# Patient Record
Sex: Male | Born: 1970 | Race: White | Hispanic: No | Marital: Single | Smoking: Former smoker
Health system: Southern US, Community
[De-identification: ages and names within clinical notes are randomized; demographics above are authoritative.]

## PROBLEM LIST (undated history)

## (undated) DIAGNOSIS — G4700 Insomnia, unspecified: Secondary | ICD-10-CM

## (undated) DIAGNOSIS — E785 Hyperlipidemia, unspecified: Secondary | ICD-10-CM

## (undated) DIAGNOSIS — M549 Dorsalgia, unspecified: Secondary | ICD-10-CM

## (undated) DIAGNOSIS — I509 Heart failure, unspecified: Secondary | ICD-10-CM

## (undated) DIAGNOSIS — M1990 Unspecified osteoarthritis, unspecified site: Secondary | ICD-10-CM

## (undated) DIAGNOSIS — M419 Scoliosis, unspecified: Secondary | ICD-10-CM

## (undated) DIAGNOSIS — I10 Essential (primary) hypertension: Secondary | ICD-10-CM

## (undated) DIAGNOSIS — F32A Depression, unspecified: Secondary | ICD-10-CM

## (undated) DIAGNOSIS — K7460 Unspecified cirrhosis of liver: Secondary | ICD-10-CM

## (undated) DIAGNOSIS — C649 Malignant neoplasm of unspecified kidney, except renal pelvis: Secondary | ICD-10-CM

## (undated) DIAGNOSIS — I951 Orthostatic hypotension: Secondary | ICD-10-CM

## (undated) DIAGNOSIS — G8929 Other chronic pain: Secondary | ICD-10-CM

## (undated) DIAGNOSIS — Z95810 Presence of automatic (implantable) cardiac defibrillator: Secondary | ICD-10-CM

## (undated) DIAGNOSIS — I428 Other cardiomyopathies: Secondary | ICD-10-CM

## (undated) DIAGNOSIS — Z789 Other specified health status: Secondary | ICD-10-CM

## (undated) DIAGNOSIS — I5022 Chronic systolic (congestive) heart failure: Secondary | ICD-10-CM

## (undated) DIAGNOSIS — F329 Major depressive disorder, single episode, unspecified: Secondary | ICD-10-CM

## (undated) DIAGNOSIS — E119 Type 2 diabetes mellitus without complications: Secondary | ICD-10-CM

## (undated) DIAGNOSIS — K219 Gastro-esophageal reflux disease without esophagitis: Secondary | ICD-10-CM

## (undated) DIAGNOSIS — Z720 Tobacco use: Secondary | ICD-10-CM

## (undated) HISTORY — DX: Malignant neoplasm of unspecified kidney, except renal pelvis: C64.9

## (undated) HISTORY — PX: NEPHRECTOMY: SHX65

## (undated) HISTORY — DX: Scoliosis, unspecified: M41.9

## (undated) HISTORY — DX: Tobacco use: Z72.0

## (undated) HISTORY — DX: Other cardiomyopathies: I42.8

## (undated) HISTORY — DX: Orthostatic hypotension: I95.1

## (undated) HISTORY — DX: Heart failure, unspecified: I50.9

## (undated) HISTORY — DX: Unspecified cirrhosis of liver: K74.60

---

## 2006-06-11 HISTORY — PX: OTHER SURGICAL HISTORY: SHX169

## 2010-10-31 ENCOUNTER — Emergency Department (HOSPITAL_COMMUNITY): Payer: Medicaid Other

## 2010-10-31 ENCOUNTER — Inpatient Hospital Stay (HOSPITAL_COMMUNITY)
Admission: EM | Admit: 2010-10-31 | Discharge: 2010-11-03 | DRG: 292 | Disposition: A | Discharge status: Home / Self Care | Payer: Medicaid Other | Attending: Internal Medicine | Admitting: Internal Medicine

## 2010-10-31 DIAGNOSIS — I5043 Acute on chronic combined systolic (congestive) and diastolic (congestive) heart failure: Principal | ICD-10-CM | POA: Diagnosis present

## 2010-10-31 DIAGNOSIS — F17200 Nicotine dependence, unspecified, uncomplicated: Secondary | ICD-10-CM | POA: Diagnosis present

## 2010-10-31 DIAGNOSIS — F1010 Alcohol abuse, uncomplicated: Secondary | ICD-10-CM | POA: Diagnosis present

## 2010-10-31 DIAGNOSIS — I509 Heart failure, unspecified: Secondary | ICD-10-CM | POA: Diagnosis present

## 2010-10-31 DIAGNOSIS — I426 Alcoholic cardiomyopathy: Secondary | ICD-10-CM | POA: Diagnosis present

## 2010-10-31 LAB — CBC
HCT: 43.6 % (ref 39.0–52.0)
Hemoglobin: 14.4 g/dL (ref 13.0–17.0)
MCH: 31.6 pg (ref 26.0–34.0)
MCHC: 33 g/dL (ref 30.0–36.0)
MCV: 95.6 fL (ref 78.0–100.0)
RDW: 14.6 % (ref 11.5–15.5)

## 2010-10-31 LAB — POCT CARDIAC MARKERS
CKMB, poc: 1.5 ng/mL (ref 1.0–8.0)
Troponin i, poc: <0.05 ng/mL (ref 0.00–0.09)

## 2010-10-31 LAB — DIFFERENTIAL
Eosinophils Relative: 1 % (ref 0–5)
Lymphocytes Relative: 24 % (ref 12–46)
Lymphs Abs: 1.3 10*3/uL (ref 0.7–4.0)
Monocytes Absolute: 0.6 10*3/uL (ref 0.1–1.0)
Monocytes Relative: 11 % (ref 3–12)
Neutro Abs: 3.5 10*3/uL (ref 1.7–7.7)

## 2010-10-31 LAB — COMPREHENSIVE METABOLIC PANEL
ALT: 17 U/L (ref 0–53)
BUN: 9 mg/dL (ref 6–23)
CO2: 26 mEq/L (ref 19–32)
Calcium: 10.3 mg/dL (ref 8.4–10.5)
GFR calc non Af Amer: >60 mL/min (ref >60)
Glucose, Bld: 103 mg/dL — ABNORMAL HIGH (ref 70–99)
Sodium: 139 mEq/L (ref 135–145)
Total Protein: 7.3 g/dL (ref 6.0–8.3)

## 2010-10-31 LAB — URINALYSIS, ROUTINE W REFLEX MICROSCOPIC
Bilirubin Urine: NEGATIVE
Hgb urine dipstick: NEGATIVE
Ketones, ur: NEGATIVE mg/dL
Nitrite: NEGATIVE
Specific Gravity, Urine: 1.015 (ref 1.005–1.030)
pH: 5.5 (ref 5.0–8.0)

## 2010-10-31 LAB — PRO B NATRIURETIC PEPTIDE: Pro B Natriuretic peptide (BNP): 2641 pg/mL — ABNORMAL HIGH (ref 0–125)

## 2010-11-01 DIAGNOSIS — I509 Heart failure, unspecified: Secondary | ICD-10-CM

## 2010-11-01 LAB — COMPREHENSIVE METABOLIC PANEL
ALT: 16 U/L (ref 0–53)
AST: 35 U/L (ref 0–37)
Alkaline Phosphatase: 151 U/L — ABNORMAL HIGH (ref 39–117)
CO2: 28 mEq/L (ref 19–32)
Chloride: 103 mEq/L (ref 96–112)
GFR calc Af Amer: >60 mL/min (ref >60)
GFR calc non Af Amer: >60 mL/min (ref >60)
Glucose, Bld: 99 mg/dL (ref 70–99)
Sodium: 140 mEq/L (ref 135–145)
Total Bilirubin: 0.9 mg/dL (ref 0.3–1.2)

## 2010-11-01 LAB — CBC
HCT: 40.1 % (ref 39.0–52.0)
Hemoglobin: 13.1 g/dL (ref 13.0–17.0)
RBC: 4.21 MIL/uL — ABNORMAL LOW (ref 4.22–5.81)

## 2010-11-01 LAB — DIFFERENTIAL
Basophils Absolute: 0 10*3/uL (ref 0.0–0.1)
Basophils Relative: 1 % (ref 0–1)
Lymphocytes Relative: 28 % (ref 12–46)
Monocytes Relative: 11 % (ref 3–12)
Neutro Abs: 3.1 10*3/uL (ref 1.7–7.7)
Neutrophils Relative %: 59 % (ref 43–77)

## 2010-11-01 LAB — CARDIAC PANEL(CRET KIN+CKTOT+MB+TROPI)
Relative Index: INVALID (ref 0.0–2.5)
Total CK: 71 U/L (ref 7–232)

## 2010-11-01 NOTE — H&P (Signed)
[name], MCKIVER NO.:  [id number]  MEDICAL RECORD NO.:  [id number]           PATIENT TYPE:  I  LOCATION:  A332                          FACILITY:  [facility]  PHYSICIAN:  Vania Rea, M.D. DATE OF BIRTH:  1971/02/15  DATE OF ADMISSION:  10/31/2010 DATE OF DISCHARGE:  LH                             HISTORY & PHYSICAL   PRIMARY CARE PHYSICIAN:  Unassigned.  CHIEF COMPLAINT:  Progressive shortness of breath for 1 month.  HISTORY OF PRESENT ILLNESS:  This is a 39 year old Caucasian gentleman who since having excision of kidney for Wilms tumor at age 48, says he has not had any medical followup, does not visit doctors, but 1 month ago developed flu-like symptoms with fever, cough and cold and runny nose, self-medicated with Mucinex; however, noticed he has been getting progressively more short of breath much worse for the past 1 week as well as shortness of breath.  He has severe orthopnea, cannot lie flat, and he has been having swelling of his ankle for the past 1 week.  He no longer has fever.  He continues to have cough, productive of green sputum.  He has been having no chest pains, no palpitations.  No nausea or vomiting.  He lives with his 57-year-old son who also had an episode of viral-like syndrome, but has recovered completely.  PAST MEDICAL HISTORY:  Remote history of nephrectomy for Wilms tumor.  MEDICATIONS:  Has been taking Mucinex.  ALLERGIES:  IBUPROFEN, which causes hives.  SOCIAL HISTORY:  Smokes one to one and a half packs of cigarettes per day, has been doing so for at least 20 years.  Drinks 5 to 12 beers per weekend.  Denies illicit drug use.  Works as a Engineer, petroleum.  Please note, he said he used to be a much heavier drinker, but has cut down now since his divorce.  REVIEW OF SYSTEMS:  Other than noted above unremarkable.  PHYSICAL EXAMINATION:  GENERAL:  Thin, young, Caucasian gentleman reclining in the stretcher, looks  older than his stated age. VITAL SIGNS:  Temperature is 98.2, pulse 114, respiration 18, blood pressure 125/91, he is saturating at 96% on room air. HEENT:  His pupils are round and equal.  Mucous membranes pink. Anicteric.  No cervical lymphadenopathy.  No thyromegaly.  No carotid bruits. CHEST:  Currently clear to auscultation bilaterally. CARDIOVASCULAR:  Tachycardic.  1/6 systolic murmurs. ABDOMEN:  Soft, nontender.  No masses. EXTREMITIES:  He has trace edema bilaterally. CENTRAL NERVOUS SYSTEM:  Cranial nerves II through XII are grossly intact.  He has no focal lateralizing signs.  LABORATORY DATA:  His white count is 5.5, hemoglobin 14.4, platelets 244.  He has a normal differential.  His sodium is 138, potassium 4.2, chloride 104, CO2 26, glucose 103, BUN 9, creatinine 0.8.  His calcium is 10.3.  His total bilirubin is 0.8, alk phos 153, AST 43, ALT 17, total protein 7.3, albumin 3.6.  His pro-BNP is 2641.  Cardiac markers are completely normal.  Two-view chest x-ray shows interstitial edema. No focal air space disease.  No effusion.  His urinalysis is completely  normal.  ASSESSMENT: 1. New onset congestive heart failure.  The differential includes     viral myocarditis versus alcoholic cardiomyopathy, coronary artery     disease also a possibility. 2. Alcohol abuse. 3. Tobacco abuse.  PLAN:  We will bring this gentleman in for evaluation of his new onset CHF.  We will start him on aspirin, ACE inhibitors, diuretics and potassium.  We will withhold beta-blockers for now until he is more stable.  We will get a 2-D echo and cycle his cardiac enzymes, probably benefit from Cardiology consult in the morning.  Other plans are as per orders.     Vania Rea, M.D.     LC/MEDQ  D:  11/01/2010  T:  11/01/2010  Job:  [id number]  Electronically Signed by Vania Rea M.D. on 11/01/2010 11:11:19 PM

## 2010-11-02 DIAGNOSIS — I5023 Acute on chronic systolic (congestive) heart failure: Secondary | ICD-10-CM

## 2010-11-02 DIAGNOSIS — I429 Cardiomyopathy, unspecified: Secondary | ICD-10-CM

## 2010-11-02 DIAGNOSIS — I369 Nonrheumatic tricuspid valve disorder, unspecified: Secondary | ICD-10-CM

## 2010-11-02 LAB — DRUGS OF ABUSE SCREEN W/O ALC, ROUTINE URINE
Amphetamine Screen, Ur: NEGATIVE
Creatinine,U: 70.7 mg/dL
Marijuana Metabolite: NEGATIVE
Methadone: NEGATIVE
Opiate Screen, Urine: NEGATIVE

## 2010-11-02 LAB — BASIC METABOLIC PANEL
Chloride: 99 mEq/L (ref 96–112)
Creatinine, Ser: 0.95 mg/dL (ref 0.4–1.5)
GFR calc Af Amer: >60 mL/min (ref >60)
Potassium: 3.9 mEq/L (ref 3.5–5.1)
Sodium: 138 mEq/L (ref 135–145)

## 2010-11-02 LAB — IRON AND TIBC
Saturation Ratios: 16 % — ABNORMAL LOW (ref 20–55)
TIBC: 400 ug/dL (ref 215–435)

## 2010-11-02 LAB — TSH: TSH: 3.647 u[IU]/mL (ref 0.350–4.500)

## 2010-11-02 LAB — FERRITIN: Ferritin: 52 ng/mL (ref 22–322)

## 2010-11-03 LAB — BASIC METABOLIC PANEL
BUN: 10 mg/dL (ref 6–23)
Chloride: 100 mEq/L (ref 96–112)
GFR calc non Af Amer: >60 mL/min (ref >60)
Glucose, Bld: 113 mg/dL — ABNORMAL HIGH (ref 70–99)
Potassium: 3.9 mEq/L (ref 3.5–5.1)

## 2010-11-03 LAB — LIPID PANEL: Triglycerides: 62 mg/dL (ref <150)

## 2010-11-06 NOTE — Discharge Summary (Signed)
[name], [name]                ACCOUNT NO.:  [id number]  MEDICAL RECORD NO.:  [id number]           PATIENT TYPE:  I  LOCATION:  A332                          FACILITY:  [facility]  PHYSICIAN:  Elliot Cousin, M.D.    DATE OF BIRTH:  05-04-1971  DATE OF ADMISSION:  10/31/2010 DATE OF DISCHARGE:  05/25/2012LH                              DISCHARGE SUMMARY   DISCHARGE DIAGNOSES: 1. Newly diagnosed acute systolic congestive heart failure.  The     patient's ejection fraction was noted to be 15-20% on the 2-D     echocardiogram on Nov 02, 2010. 2. Severe cardiomyopathy with the patient's echocardiogram revealing     diffuse hypokinesis, ejection fraction of 15-20%, grade 1 diastolic     dysfunction, mild mitral valve regurgitation, left atrium mildly to     moderately dilated, right ventricular systolic function was mildly     reduced, right atrium was mildly to moderately dilated, tricuspid     valve with moderate regurgitation, and pulmonary systolic pressure     was moderately increased at 55 mmHg.  The possible etiology is that     the patient has a viral cardiomyopathy versus alcohol-induced     cardiomyopathy. 3. Tobacco and alcohol abuse.  The patient was advised to stop     both.  DISCHARGE MEDICATIONS: 1. Carvedilol 6.25 mg b.i.d. 2. Furosemide 40 mg daily. 3. Lisinopril 10 mg daily. 4. Multivitamin with iron once daily.  DISCHARGE DISPOSITION:  The patient was discharged to home in improved and stable condition on Nov 03, 2010.  He will follow up with Dr. Dietrich Pates on Nov 09, 2010.  CONSULTATIONS: 1. Judith [place] Bing, MD, [facility] . 2. Jonelle Sidle, MD  PROCEDURE PERFORMED: 1. 2-D echocardiogram on Nov 02, 2010.  The results were dictated     above. 2. Chest x-ray on Oct 31, 2010.  The results revealed interstitial     edema. 3. Abdominal x-ray on Oct 31, 2010.  The results revealed no acute     finding in the abdomen.  Cardiomegaly.  HISTORY OF PRESENT ILLNESS:   The patient is a 40 year old man with a past medical history significant for Wilms tumor status post nephrectomy in the past, who presented to the emergency department on Oct 31, 2010, with a chief complaint of progressive shortness of breath for approximately 1 month.  The patient also complained of swelling in his legs and ankles for the past week.  One month ago, he developed flu-like symptoms with a fever, cough, runny nose, and body aches.  He self medicated with Mucinex.  He improved symptomatically but then became progressively short of breath.  In the emergency department, he was noted to be afebrile and tachycardic with a heart rate ranging from 103- 114 beats per minute.  His EKG revealed sinus tachycardia with a heart rate of 104 beats per minute, biatrial enlargement, superior right axis deviation, and lateral T-wave changes.  His BNP was elevated at 2641. His initial cardiac markers were negative.  He was admitted for further evaluation and management.  HOSPITAL COURSE:  The patient was started empirically on  Lasix 40 mg IVq.12 h, potassium chloride supplementation, 81 mg of aspirin, ACE inhibitor therapy with captopril, and fluid restriction.  His I' and O's and daily weights were monitored.  A nicotine patch was placed.  He was advised to stop smoking permanently.  He was also started on vitamin therapy and as-needed Ativan, although the patient had no signs or symptoms consistent with alcohol withdrawal syndrome.  He was advised to stop drinking.  Nitroglycerin paste was applied in the emergency department, however because of low normal blood pressures, it was eventually discontinued.  For further evaluation, a number of studies were ordered.  His followup BNP actually increased to 3846.  His electrolyte panel was essentially within normal limits.  His cardiac enzymes were all within normal limits.  His TSH was within normal limits at 3.6 and his free T4 was within normal  limits at 1.36.  Cardiologist Dr. Thayne Bing was consulted.  He evaluated the patient and agreed that the patient probably had an underlying cardiomyopathy.  The 2-D echocardiogram which had been ordered, had not been read or interpreted at the time of Dr. Marvel Plan assessment.  Even before the results of the 2-D echocardiogram, Dr. Dietrich Pates made some adjustments in the his medications.  He discontinued the captopril and started him instead on lisinopril.  He discontinued aspirin and transitioned the Lasix from IV to p.o.  He also started Coreg.  He ordered a number of other studies.  The results of the studies revealed a negative urine drug screen, negative HIV antibody, and a normal ferritin of 52.  The results of the 2-D echocardiogram were dictated above.  It was clear that he had a severe cardiomyopathy.  The cardiology team did not believe his cardiomyopathy was ischemic in nature.  However, it was proposed that in the near future, he would require a right heart and left heart cardiac catheterization.  On admission, the patient's weight was 57.7 kg and prior to discharge it was 52.8 kg.  He diuresed several liters of fluid effectively during the hospitalization.  At the time of hospital discharge, he was free of shortness of breath.  His peripheral edema had completely resolved.     Elliot Cousin, M.D.     DF/MEDQ  D:  11/03/2010  T:  11/04/2010  Job:  [id number]  cc:   Gerrit Friends. Dietrich Pates, MD, [facility] [address] [place], [place] [address]  Electronically Signed by Elliot Cousin M.D. on 11/06/2010 06:50:07 PM

## 2010-11-09 ENCOUNTER — Ambulatory Visit (INDEPENDENT_AMBULATORY_CARE_PROVIDER_SITE_OTHER): Payer: Medicaid Other | Admitting: Adult Health

## 2010-11-09 ENCOUNTER — Encounter: Payer: Self-pay | Admitting: *Deleted

## 2010-11-09 ENCOUNTER — Encounter: Payer: Self-pay | Admitting: Adult Health

## 2010-11-09 DIAGNOSIS — I428 Other cardiomyopathies: Secondary | ICD-10-CM

## 2010-11-09 DIAGNOSIS — I420 Dilated cardiomyopathy: Secondary | ICD-10-CM

## 2010-11-09 DIAGNOSIS — I5020 Unspecified systolic (congestive) heart failure: Secondary | ICD-10-CM | POA: Insufficient documentation

## 2010-11-09 NOTE — Patient Instructions (Signed)
Your physician recommends that you schedule a follow-up appointment in: 1 month Low salt diet - hand out Note- out of work until return

## 2010-11-09 NOTE — Progress Notes (Signed)
HPI: [name] is a 40 y/o CM patient of Dr. Dietrich Pates with known history of excision of kidney for Wilms tumor at age 40, with no regular physician follow-up since who presented to [facility] with shortness of breath where we saw him on consultation.  He was diagnosed with nonischemic CM per echo demonstrating EF of 15%-20%, and systolic CHF.  He has a  History of ongoing tobacco and ETOH abuse.  He was diuresed and placed on carvedilol 6.25 mg BID, furosemide 40 mg daily, lisinopril 10 mg daily, and a multivitamin with iron daily.  He is here post hospital follow-up. He brings with him daily weight records and maintains his weight nicely.  He denies symptoms of chest discomfort, shortness of breath or edema. He speaks of his personal stress as a single parent raising an 97 y/o and the stress of this illness.  He wonders if this contributed to his decline in health.  Otherwise he is without complaint.  Allergies  Allergen Reactions  . Ibuprofen Hives    Current Outpatient Prescriptions  Medication Sig Dispense Refill  . carvedilol (COREG) 6.25 MG tablet Take 6.25 mg by mouth 2 (two) times daily with a meal.        . furosemide (LASIX) 40 MG tablet Take 40 mg by mouth daily.        Marland Kitchen lisinopril (PRINIVIL,ZESTRIL) 10 MG tablet Take 10 mg by mouth daily.        . Multiple Vitamins-Minerals (MULTIVITAMIN WITH MINERALS) tablet Take 1 tablet by mouth daily.          Past Medical History  Diagnosis Date  . CHF (congestive heart failure)     new on set  . Myocarditis   . Cardiomyopathy   . CAD (coronary artery disease)     Past Surgical History  Procedure Date  . Nephrectomy     wilms tumor    ROS: Review of systems complete and found to be negative unless listed above PHYSICAL EXAM General: Well developed, well nourished, in no acute distress Head: Eyes PERRLA, No xanthomas.   Normal cephalic and atramatic  Lungs: Clear bilaterally to auscultation and percussion. Heart: HRRR S1 S2 Pulses are 2+  & equal.            No carotid bruit. No JVD.  No abdominal bruits. No femoral bruits. Abdomen: Bowel sounds are positive, abdomen soft and non-tender without masses or                  Hernia's noted. Msk:  Back normal, normal gait. Normal strength and tone for age. Extremities: No clubbing, cyanosis or edema.  DP +1 Neuro: Alert and oriented X 3. Psych:  Good affect, responds appropriately :  ASSESSMENT AND PLAN

## 2010-11-09 NOTE — Assessment & Plan Note (Signed)
He remains compliant with his medications. He has stopped smoking. He concerned about the welfare of his son and wants to be healthy in order to care for him.  He is still having some trouble with fast food because it is easier to do when he works.  Sandwich meats and hotdogs are also foods that he eats.  I have advised him on a low salt diet and reminded him of his heart condition and need to be careful about fluid overload.  I have given him  low salt diet instructions.  He will see me in one month. May need to increase his coreg at that time.  Follow-up echo in July.

## 2010-12-08 ENCOUNTER — Encounter: Payer: Self-pay | Admitting: *Deleted

## 2010-12-08 ENCOUNTER — Encounter: Payer: Self-pay | Admitting: Adult Health

## 2010-12-08 ENCOUNTER — Ambulatory Visit (INDEPENDENT_AMBULATORY_CARE_PROVIDER_SITE_OTHER): Payer: Medicaid Other | Admitting: Adult Health

## 2010-12-08 DIAGNOSIS — I5020 Unspecified systolic (congestive) heart failure: Secondary | ICD-10-CM

## 2010-12-08 DIAGNOSIS — I428 Other cardiomyopathies: Secondary | ICD-10-CM

## 2010-12-08 NOTE — Progress Notes (Signed)
HPI: Mr. Crookston is a 40 y/o CM patient of Dr. Dietrich Pates with known history of excision of kidney for Wilms tumor at age 3, with no regular physician follow-up since who presented to [facility] with shortness of breath where we saw him on consultation.  He was diagnosed with nonischemic CM per echo demonstrating EF of 15%-20%, and systolic CHF.  He has a  History of ongoing tobacco and ETOH abuse.  He was diuresed and placed on carvedilol 6.25 mg BID, furosemide 40 mg daily, lisinopril 10 mg daily, and a multivitamin with iron daily.  He is here post hospital follow-up. He brings with him daily weight records and maintains his weight nicely.  He denies symptoms of chest discomfort, shortness of breath or edema. He speaks of his personal stress as a single parent raising an 38 y/o and the stress of this illness.  He wonders if this contributed to his decline in health.  Otherwise he is without complaint. On last visit, he was doing well, compliant with his medications and was counseled on low salt diet.  Today he is doing well and has stopped drinking Mt. Dew, completely avoiding salt. He has not gone back to work yet.  He remains as active as he can.Quit smoking. Wt down 3 lbs.  Allergies  Allergen Reactions  . Ibuprofen Hives    Current Outpatient Prescriptions  Medication Sig Dispense Refill  . carvedilol (COREG) 6.25 MG tablet Take 6.25 mg by mouth 2 (two) times daily with a meal.        . furosemide (LASIX) 40 MG tablet Take 40 mg by mouth daily.        Marland Kitchen lisinopril (PRINIVIL,ZESTRIL) 10 MG tablet Take 10 mg by mouth daily.        . Multiple Vitamins-Minerals (MULTIVITAMIN WITH MINERALS) tablet Take 1 tablet by mouth daily.          Past Medical History  Diagnosis Date  . CHF (congestive heart failure)     new on set  . Myocarditis   . Cardiomyopathy   . CAD (coronary artery disease)     Past Surgical History  Procedure Date  . Nephrectomy     wilms tumor   VS: 123/81 89  16 wt 118  lbs. ROS: Review of systems complete and found to be negative unless listed above PHYSICAL EXAM General: Well developed, well nourished, in no acute distress Head: Eyes PERRLA, No xanthomas.   Normal cephalic and atramatic  Lungs: Clear bilaterally to auscultation and percussion. Heart: HRRR S1 S2 Pulses are 2+ & equal.            No carotid bruit. No JVD.  No abdominal bruits. No femoral bruits. Abdomen: Bowel sounds are positive, abdomen soft and non-tender without masses or                  Hernia's noted. Msk:  Back normal, normal gait. Normal strength and tone for age. Extremities: No clubbing, cyanosis or edema.  DP +1 Neuro: Alert and oriented X 3. Psych:  Good affect, responds appropriately :  ASSESSMENT AND PLAN

## 2010-12-08 NOTE — Assessment & Plan Note (Signed)
No evidence of fluid overload.  Plans as above.

## 2010-12-08 NOTE — Assessment & Plan Note (Signed)
He is doing really well.  I will repeat ECHO for improvement of LV fx.  I will increase his coreg 6.25 mg BID, to 9.75 mg in am and continue 6.25mg  at evening dose.  He will have a follow-up BMET for LV fx. Will see him in 2 weeks to ascertain whether he can go back to work.  He is given work Physicist, medical for 2 more weeks.

## 2010-12-08 NOTE — Patient Instructions (Addendum)
Your physician recommends that you schedule a follow-up appointment in: July 13  Your physician recommends that you return for lab work in: 2 weeks Your physician has requested that you have an echocardiogram. Echocardiography is a painless test that uses sound waves to create images of your heart. It provides your doctor with information about the size and shape of your heart and how well your heart's chambers and valves are working. This procedure takes approximately one hour. There are no restrictions for this procedure.  Your physician has recommended you make the following change in your medication: CHANGE COREG TO 1 1/2  IN AM AND 1 TABLET IN PM

## 2010-12-11 ENCOUNTER — Ambulatory Visit (HOSPITAL_COMMUNITY)
Admission: RE | Admit: 2010-12-11 | Discharge: 2010-12-11 | Disposition: A | Discharge status: Home / Self Care | Payer: Medicaid Other | Source: Ambulatory Visit | Attending: Cardiology | Admitting: Cardiology

## 2010-12-11 DIAGNOSIS — F17200 Nicotine dependence, unspecified, uncomplicated: Secondary | ICD-10-CM | POA: Insufficient documentation

## 2010-12-11 DIAGNOSIS — I059 Rheumatic mitral valve disease, unspecified: Secondary | ICD-10-CM

## 2010-12-11 DIAGNOSIS — I509 Heart failure, unspecified: Secondary | ICD-10-CM | POA: Insufficient documentation

## 2010-12-11 DIAGNOSIS — R079 Chest pain, unspecified: Secondary | ICD-10-CM | POA: Insufficient documentation

## 2010-12-11 NOTE — Consult Note (Signed)
[name], [name]                ACCOUNT NO.:  [id number]  MEDICAL RECORD NO.:  [id number]           PATIENT TYPE:  I  LOCATION:  A332                          FACILITY:  [facility]  PHYSICIAN:  Gerrit Friends. Dietrich Pates, MD, FACCDATE OF BIRTH:  03-Mar-1971  DATE OF CONSULTATION: DATE OF DISCHARGE:                                CONSULTATION   REFERRING PHYSICIAN:  Dr. Orvan Falconer.  PRIMARY CARE PHYSICIAN:  None.  HISTORY OF PRESENT ILLNESS:  A 40 year old gentleman with a 47-month history of progressive exercise intolerance, lassitude, malaise and orthopnea and edema for the few days prior to presenting to the emergency department with congestive heart failure.  This nice gentleman is a single parent who works as a Chartered certified accountant.  He has had a benign medical history since undergoing nephrectomy for Wilms tumor at age 28. Approximately 2 months ago, he developed an acute illness with URI symptoms that was fairly severe, but resolved over the course of a fewdays.  He subsequently has noted malaise, inability to perform much physical activity and overwhelming fatigue.  He has had modest dyspnea on exertion that has been progressive over the past few days and has developed orthopnea and pedal edema prompting evaluation in the emergency department where chest x-ray showed pulmonary edema.  With an initial diuresis of 2.5 L, his symptoms have resolved.  He denies hypertension or hyperlipidemia, but has received little in the way of medical care.  He has been a cigarette smoker with an approximate 30-pack-year consumption.  He reports 5-12 beers per weekend, but has not suffered DTs nor does he have other findings of alcohol habituation. He reports even higher alcohol consumption in the past.  PAST MEDICAL HISTORY:  Negative except as noted above.  MEDICATIONS:  Essentially none at the time of presentation.  ALLERGIES:  IBUPROFEN resulted in urticaria.  SOCIAL HISTORY:  Divorced and serves as a  single parent to his young son.  Alcohol and tobacco consumption as noted above.  He denies illicit drugs or other HIV risk factors.  REVIEW OF SYSTEMS:  He consumes an unrestricted diet at home.  He has upper and lower dentures.  There is no impairment in eyesight or hearing.  He has been thought to be depressed in recent weeks, but this may simply have been generalized weakness and in addition related to congestive heart failure.  He reports difficulty falling asleep, which he attributes to stress.  All other systems reviewed and are negative.  PHYSICAL EXAMINATION:  GENERAL:  Trim, pleasant gentleman in no acute distress. VITAL SIGNS;  The weight was 57.7 kg on admission, falling to 55.9 today.  He is afebrile with a heart rate of 100, respirations of 20 and a blood pressure 115/80.  O2 saturation 93% on room air. HEENT:  EOMs full; pupils equal, round, react to light; normal oral mucosa. NECK:  Mild jugular venous distention; normal carotid upstrokes without bruits. ENDOCRINE:  No thyromegaly. HEMATOPOIETIC:  No adenopathy. SKIN:  No significant lesions. CHEST:  Normal AP diameter; few basilar rales. CARDIAC:  Normal first and second heart sounds; prominent third heart sounds; systolic murmur at the  left sternal border and apex. ABDOMEN:  Soft and nontender; normal bowel sounds; no organomegaly.  No bruits. EXTREMITIES:  No edema; normal distal pulses. NEUROLOGIC:  Symmetric strength and tone; normal cranial nerves. PSYCHIATRIC:  Alert and oriented; normal affect.  EKG:  Sinus tachycardia; left atrial abnormality; right atrial enlargement; borderline low-voltage; delayed R-wave progression; extreme right axis; nonspecific ST-T wave abnormality.  Other laboratory generally unremarkable with a normal CBC, normal chemistry profile, and normal cardiac markers.  Pro BNP is elevated to 3846.  IMPRESSION:  [name] unfortunately appears to be presenting with a cardiomyopathy,  was certainly nonischemic.  Due to malfunction of our ultrasounds system, an echocardiogram could not be performed today.  We will push to obtain that study early tomorrow.  I will proceed as if he has impaired left ventricular systolic function by increasing his ACE inhibitor, adding low dose beta-blocker, which could be titrated as an outpatient and continuing diuretic.  It appears that he will be ready for discharge tomorrow.  A TSH level, HIV level and iron studies will be obtained looking for a cause of his cardiomyopathy.  The possibility of a viral etiology is raised by his recent URI-type illness.  The possibility of alcohol cardiomyopathy is raised by his history of drinking.  He certainly should stop this immediately.  The patient expressed an interest in discontinuing cigarette smoking, which certainly would be beneficial in general.  We appreciate the request for consultation and we will be happy to follow this nice gentleman once he is discharged from hospital.     Gerrit Friends. Dietrich Pates, MD, [facility]     RMR/MEDQ  D:  11/01/2010  T:  11/02/2010  Job:  [id number]  Electronically Signed by Girard Bing MD [facility] on 12/11/2010 05:02:51 PM

## 2010-12-21 ENCOUNTER — Other Ambulatory Visit: Payer: Self-pay | Admitting: Adult Health

## 2010-12-22 ENCOUNTER — Ambulatory Visit: Payer: Self-pay | Admitting: Adult Health

## 2010-12-22 LAB — BASIC METABOLIC PANEL
CO2: 28 mEq/L (ref 19–32)
Calcium: 9.9 mg/dL (ref 8.4–10.5)
Chloride: 98 mEq/L (ref 96–112)
Glucose, Bld: 132 mg/dL — ABNORMAL HIGH (ref 70–99)
Sodium: 134 mEq/L — ABNORMAL LOW (ref 135–145)

## 2010-12-25 ENCOUNTER — Encounter: Payer: Self-pay | Admitting: Adult Health

## 2010-12-25 ENCOUNTER — Ambulatory Visit (INDEPENDENT_AMBULATORY_CARE_PROVIDER_SITE_OTHER): Payer: Medicaid Other | Admitting: Adult Health

## 2010-12-25 DIAGNOSIS — E875 Hyperkalemia: Secondary | ICD-10-CM

## 2010-12-25 DIAGNOSIS — I420 Dilated cardiomyopathy: Secondary | ICD-10-CM

## 2010-12-25 DIAGNOSIS — I428 Other cardiomyopathies: Secondary | ICD-10-CM

## 2010-12-25 DIAGNOSIS — I5020 Unspecified systolic (congestive) heart failure: Secondary | ICD-10-CM

## 2010-12-25 NOTE — Assessment & Plan Note (Signed)
No evidence of shortness of breath, fluid retension, or JVD.  He appears euvolemic.  He will continue on low salt diet.

## 2010-12-25 NOTE — Progress Notes (Signed)
HPI: [name] is a 39 y/o CM patient of Dr. Dietrich Pates with known history of excision of kidney for Wilms tumor at age 43, with no regular physician follow-up since who presented to [facility] with shortness of breath where we saw him on consultation.  He was diagnosed with nonischemic CM per echo demonstrating EF of 15%-20%, and systolic CHF.  He has a  History of ongoing tobacco and ETOH abuse.  He was diuresed and placed on carvedilol 6.25 mg BID, furosemide 40 mg daily, lisinopril 10 mg daily, and a multivitamin with iron daily.  He is here post hospital follow-up. He brings with him daily weight records and maintains his weight nicely.  He denies symptoms of chest discomfort, shortness of breath or edema. He speaks of his personal stress as a single parent raising an 3 y/o and the stress of this illness.  He wonders if this contributed to his decline in health.  Otherwise he is without complaint. On last visit, he was doing well, compliant with his medications and was counseled on low salt diet. He has not had cardiac catheterization for evaluation for CAD.  Today he is doing well and has stopped drinking Mt. Dew, completely avoiding salt. He has not gone back to work yet.  He remains as active as he can.Quit smoking. Wt down 3 lbs.  I repeated his echocardiogram to assess for improvement in LV after 3 months of medications. I also repeated lab work to check kidney fx.  He is anxious to return to work. He is asymptomatic.  Allergies  Allergen Reactions  . Ibuprofen Hives    Current Outpatient Prescriptions  Medication Sig Dispense Refill  . carvedilol (COREG) 6.25 MG tablet Take 1 1/2 tablets in am and 1 tablet in pm      . furosemide (LASIX) 40 MG tablet Take 40 mg by mouth daily.        Marland Kitchen lisinopril (PRINIVIL,ZESTRIL) 10 MG tablet Take 10 mg by mouth daily.        . Multiple Vitamins-Minerals (MULTIVITAMIN WITH MINERALS) tablet Take 1 tablet by mouth daily.          Past Medical History  Diagnosis  Date  . CHF (congestive heart failure)     new on set  . Myocarditis   . Cardiomyopathy     Past Surgical History  Procedure Date  . Nephrectomy     wilms tumor   VS: 123/81 89  16 wt 118 lbs. ROS: Review of systems complete and found to be negative unless listed above PHYSICAL EXAM General: Well developed, well nourished, in no acute distress Head: Eyes PERRLA, No xanthomas.   Normal cephalic and atramatic  Lungs: Clear bilaterally to auscultation and percussion. Heart: HRRR S1 S2 Pulses are 2+ & equal.            No carotid bruit. No JVD.  No abdominal bruits. No femoral bruits. Abdomen: Bowel sounds are positive, abdomen soft and non-tender without masses or                  Hernia's noted. Msk:  Back normal, normal gait. Normal strength and tone for age. Extremities: No clubbing, cyanosis or edema.  DP +1 Neuro: Alert and oriented X 3. Psych:  Good affect, responds appropriately :  ASSESSMENT AND PLAN

## 2010-12-25 NOTE — Assessment & Plan Note (Addendum)
I have reviewed echocardiogram and EF is now 25%.  With diffuse hypokinesis.  I have discussed this with Dr. Dietrich Pates who has reviewed his history and notes with me.  He advised to go ahead and titrate up Coreg to 12.5mg  BID for two more weeks and then to 25 mg BID.  He will continue other medications.    BMET demonstrated  Potassium of 5.7 with no medications contributing to this. I did not ask him if he is using "No -Salt" potassium chloride seasoning.  There was a discussion about stopping lisinopril in this setting. Creat 0.76.  We will repeat the BMET in 2 weeks and give him a low potassium diet.  Cortisol and aldosterone levels with be drawn with that lab.  He is to see Dr. Dietrich Pates on follow up.  There was no discussion for ICD at this time. This will be addressed at a later date after seen by Dr. Dietrich Pates.    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

## 2010-12-25 NOTE — Patient Instructions (Addendum)
**Note De-Identified  Obfuscation** Your physician recommends that you return for lab work in: today  Your physician recommends that you decrease potassium in your diet  Your physician recommends that you schedule a follow-up appointment in: 2 weeks

## 2010-12-26 LAB — BASIC METABOLIC PANEL
BUN: 6 mg/dL (ref 6–23)
CO2: 26 mEq/L (ref 19–32)
Chloride: 100 mEq/L (ref 96–112)
Creat: 0.86 mg/dL (ref 0.50–1.35)
Glucose, Bld: 93 mg/dL (ref 70–99)

## 2010-12-28 ENCOUNTER — Other Ambulatory Visit: Payer: Self-pay

## 2010-12-28 ENCOUNTER — Telehealth: Payer: Self-pay

## 2010-12-31 ENCOUNTER — Encounter: Payer: Self-pay | Admitting: *Deleted

## 2011-01-01 ENCOUNTER — Other Ambulatory Visit: Payer: Self-pay | Admitting: Cardiology

## 2011-01-01 ENCOUNTER — Ambulatory Visit (INDEPENDENT_AMBULATORY_CARE_PROVIDER_SITE_OTHER): Payer: Medicaid Other | Admitting: Cardiology

## 2011-01-01 ENCOUNTER — Encounter: Payer: Self-pay | Admitting: Adult Health

## 2011-01-01 DIAGNOSIS — I428 Other cardiomyopathies: Secondary | ICD-10-CM

## 2011-01-01 DIAGNOSIS — I420 Dilated cardiomyopathy: Secondary | ICD-10-CM

## 2011-01-01 DIAGNOSIS — I5020 Unspecified systolic (congestive) heart failure: Secondary | ICD-10-CM

## 2011-01-01 DIAGNOSIS — I951 Orthostatic hypotension: Secondary | ICD-10-CM

## 2011-01-01 DIAGNOSIS — Z01818 Encounter for other preprocedural examination: Secondary | ICD-10-CM

## 2011-01-01 LAB — CBC
HCT: 40.4 % (ref 39.0–52.0)
Hemoglobin: 13.7 g/dL (ref 13.0–17.0)
MCH: 30.7 pg (ref 26.0–34.0)
MCV: 90.6 fL (ref 78.0–100.0)
RBC: 4.46 MIL/uL (ref 4.22–5.81)

## 2011-01-01 LAB — APTT: aPTT: 27 seconds (ref 24–37)

## 2011-01-01 LAB — BASIC METABOLIC PANEL
CO2: 30 mEq/L (ref 19–32)
Glucose, Bld: 114 mg/dL — ABNORMAL HIGH (ref 70–99)
Potassium: 4.5 mEq/L (ref 3.5–5.3)
Sodium: 134 mEq/L — ABNORMAL LOW (ref 135–145)

## 2011-01-01 LAB — ALDOSTERONE + RENIN ACTIVITY W/ RATIO: PRA LC/MS/MS: 1.73 ng/mL/h (ref 0.25–5.82)

## 2011-01-01 MED ORDER — CARVEDILOL 12.5 MG PO TABS: 12.5000 mg | ORAL_TABLET | Freq: Two times a day (BID) | ORAL | Status: DC

## 2011-01-01 MED ORDER — FUROSEMIDE 20 MG PO TABS: 20.0000 mg | ORAL_TABLET | Freq: Every day | ORAL | Status: DC

## 2011-01-01 NOTE — Assessment & Plan Note (Addendum)
Patient has a cardiomyopathy of uncertain etiology, possibly secondary to alcohol use.  He has a number of risk factors for cardiovascular disease, but no manifestations of same.  Cardiac catheterization will be undertaken to further investigate the etiology of his cardiac condition and the extent to which he is currently compensated.  The risks of the procedure were described to him including CVA, MI and death.  He agrees to proceed.  We discussed the advisability of AICD implantation.  He is not a candidate at present due to the recent diagnosis of CHF, but will be referred to Dr. Ladona Ridgel for further evaluation.

## 2011-01-01 NOTE — Assessment & Plan Note (Addendum)
Patient presented with congestive heart failure, but now appears relatively well compensated.  Despite some improvement in left ventricular systolic function, performance status may remain significantly impaired.  We discussed the possibility of a disability application.  He would prefer to work if possible.  Unfortunately low blood pressure, particularly when standing, is limiting the effectiveness of treatment for his cardiomyopathy and CHF.  These issues will be revisited at his next appointment immediately following cardiac catheterization.

## 2011-01-01 NOTE — Assessment & Plan Note (Addendum)
Patient was symptomatic on one occasion and now has a marginally significant drop in systolic blood pressure when standing without symptoms.  Unfortunately, he would potentially benefit from further up titration of his medication for cardiomyopathy, which could further exacerbate blood pressure issues.  For ease of administration, his dose of Coreg will be increased slightly to 12.5 mg b.i.d., but his dose of furosemide will be decreased to 20 mg q.d.  He will monitor her weight and symptoms and report any significant change.  We will continue to monitor electrolytes and renal function and plan a return visit subsequent to cardiac catheterization.Marland Kitchen

## 2011-01-01 NOTE — Patient Instructions (Addendum)
Your physician has recommended you make the following change in your medication:Coreg to 12.5 mg twice daily, decrease Lasix to 20 mg daily (PLEASE HOLD THE DAY OF CATH)  Your physician recommends that you return for lab work in: TODAY  Your physician recommends that you schedule a follow-up appointment in: after cath

## 2011-01-01 NOTE — Progress Notes (Signed)
HPI : Mr. Auzenne is seen in the office today at his request for evaluation of worsening symptoms since he returned to work one week ago.  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  On one occasion, when he was exerting himself, he experienced dizziness and blackening of his vision without loss of consciousness.  He has had some vague aching discomfort in the left arm and shoulder as well.  He denies dyspnea, orthopnea or pedal edema.  Weight has been stable.  Current Outpatient Prescriptions on File Prior to Visit  Medication Sig Dispense Refill  . lisinopril (PRINIVIL,ZESTRIL) 10 MG tablet Take 10 mg by mouth daily.        . Multiple Vitamins-Minerals (MULTIVITAMIN WITH MINERALS) tablet Take 1 tablet by mouth daily.        Marland Kitchen DISCONTD: carvedilol (COREG) 6.25 MG tablet Take 1 1/2 tablets in am and 1 tablet in pm      . DISCONTD: furosemide (LASIX) 40 MG tablet Take 40 mg by mouth daily.           Allergies  Allergen Reactions  . Ibuprofen Hives      Past medical history, social history, and family history reviewed and updated.  ROS:   See history of present illness.  PHYSICAL EXAM: BP 105/67  Pulse 88  Ht 5\' 9"  (1.753 m)  Wt 53.524 kg (118 lb)  BMI 17.43 kg/m2  SpO2 96%  General-Well developed; no acute distress Body habitus-Very thin Neck-No JVD; no carotid bruits Lungs-clear lung fields; resonant to percussion Cardiovascular-normal PMI; normal S1; increased intensity of S2. Abdomen-normal bowel sounds; soft and non-tender without masses or organomegaly Musculoskeletal-moderate scoliosis in the lumbar region convex to the right, no cyanosis or clubbing Neurologic-Normal cranial nerves; symmetric strength and tone Skin-Warm, no significant lesions Extremities-distal pulses intact; no edema  EKG: Normal sinus rhythm; left atrial enlargement; nonspecific T-wave abnormality in the  lateral leads; increased QRS voltage, normal for age, delayed R-wave progression; no prior tracing for comparison.  ASSESSMENT AND PLAN:

## 2011-01-02 ENCOUNTER — Ambulatory Visit (HOSPITAL_COMMUNITY)
Admission: RE | Admit: 2011-01-02 | Discharge: 2011-01-02 | Disposition: A | Discharge status: Home / Self Care | Payer: Medicaid Other | Source: Ambulatory Visit | Attending: Cardiovascular Disease | Admitting: Cardiovascular Disease

## 2011-01-02 DIAGNOSIS — I428 Other cardiomyopathies: Secondary | ICD-10-CM | POA: Insufficient documentation

## 2011-01-02 LAB — POCT I-STAT 3, ART BLOOD GAS (G3+)
Bicarbonate: 26.2 mEq/L — ABNORMAL HIGH (ref 20.0–24.0)
O2 Saturation: 99 %
TCO2: 27 mmol/L (ref 0–100)
pCO2 arterial: 42.8 mmHg (ref 35.0–45.0)

## 2011-01-02 LAB — POCT I-STAT 3, VENOUS BLOOD GAS (G3P V)
Acid-Base Excess: 1 mmol/L (ref 0.0–2.0)
Bicarbonate: 27 mEq/L — ABNORMAL HIGH (ref 20.0–24.0)
TCO2: 28 mmol/L (ref 0–100)
pO2, Ven: 41 mmHg (ref 30.0–45.0)

## 2011-01-03 NOTE — Cardiovascular Report (Signed)
NAMEGANON, DEMASI NO.:  [id number]  MEDICAL RECORD NO.:  [id number]  LOCATION:  [facility]                         FACILITY:  [facility]  PHYSICIAN:  Verne Carrow, MDDATE OF BIRTH:  [birth year]  DATE OF PROCEDURE:  01/02/2011 DATE OF DISCHARGE:  01/02/2011                           CARDIAC CATHETERIZATION   PRIMARY CARDIOLOGIST:  Gerrit Friends. Dietrich Pates, MD, [facility]  PROCEDURES PERFORMED: 1. Left heart catheterization. 2. Right heart catheterization. 3. Selective coronary angiography. 4. Left ventricular angiogram.  OPERATOR:  Verne Carrow, MD  INDICATIONS:  This is a 39-year Caucasian male with a history of former alcohol abuse and tobacco abuse who has recently been found to have left ventricular systolic dysfunction.  Diagnostic catheterization was planned for today to assess pulmonary pressures and also to exclude coronary artery disease as the cause of his cardiomyopathy.  DETAILS OF PROCEDURE:  The patient was brought to the main cardiac catheterization laboratory after signing informed consent for the procedure.  The right groin was prepped and draped in sterile fashion. A 1% lidocaine was used for local anesthesia.  A 5-French sheath was inserted into the right femoral artery without difficulty.  A 5-French sheath was inserted into the right femoral vein without difficulty.  A right heart catheterization was performed with a 5-French multipurpose catheter.  We then performed selective coronary angiography with standard diagnostic catheters.  Pigtail catheter was used to cross the aortic valve into the left ventricle.  A hand injection was used to perform a left ventricular angiogram.  The patient tolerated the procedure well.  There were no immediate complications.  HEMODYNAMIC FINDINGS: 1. Central aortic pressure 102/69. 2. Left ventricular pressure 87/7/18. 3. Right atrial pressure 3. 4. Right ventricular pressure 35/2/4. 5. Pulmonary  artery pressure 33/13 with a mean of 20. 6. Pulmonary capillary wedge pressure 13. 7. Cardiac output 4.78 liters per minute. 8. Cardiac index 2.99 liters per minute per meter squared. 9. Pulmonary artery saturation 74%. 10.Central aortic saturation 99%.  ANGIOGRAPHIC FINDINGS: 1. The left main coronary artery had no evidence of disease. 2. The left anterior descending was a large vessel that coursed to the     apex and gave off several small-caliber diagonal branches and one     moderate-sized diagonal branch.  There was no evidence of disease     in this vessel.  This vessel did wrap around the apex. 3. The circumflex artery gave off a large bifurcating obtuse marginal     branch from the midportion of the vessel.  There was no disease in     this vessel.  The AV groove circumflex beyond the takeoff of the     obtuse marginal was small in caliber had no disease. 4. Right coronary artery was a large dominant vessel with no evidence     of disease. 5. Left ventricular angiogram was performed in the RAO projection with     a hand injection and showed global left ventricular systolic     function with ejection fraction of 25%.  IMPRESSION: 1. No angiographic evidence of coronary artery disease. 2. Normal right heart pressures. 3. Nonischemic cardiomyopathy with severe left ventricular systolic     dysfunction.  RECOMMENDATIONS:  I recommend continued medical management of this patient's cardiomyopathy.  He is on a good medical regimen at this time. Follow up with Dr. Dietrich Pates in 2 weeks.     Verne Carrow, MD     CM/MEDQ  D:  01/02/2011  T:  01/03/2011  Job:  [id number]  cc:   Gerrit Friends. Dietrich Pates, MD, [facility]  Electronically Signed by Verne Carrow MD on 01/03/2011 01:48:03 PM

## 2011-01-05 ENCOUNTER — Ambulatory Visit (INDEPENDENT_AMBULATORY_CARE_PROVIDER_SITE_OTHER): Payer: Medicaid Other | Admitting: Cardiology

## 2011-01-05 ENCOUNTER — Encounter: Payer: Self-pay | Admitting: Cardiology

## 2011-01-05 VITALS — BP 117/66 | HR 83 | Ht 69.0 in | Wt 121.0 lb

## 2011-01-05 DIAGNOSIS — I428 Other cardiomyopathies: Secondary | ICD-10-CM

## 2011-01-05 DIAGNOSIS — I420 Dilated cardiomyopathy: Secondary | ICD-10-CM

## 2011-01-05 NOTE — Progress Notes (Signed)
HPI : [name] returns to the office following cardiac catheterization performed as an outpatient.  He had no apparent complications with the procedure.  Results were good showing normal coronary arteries, good cardiac output and normal intracardiac pressures.  LV systolic function had improved to 25%.  Current Outpatient Prescriptions on File Prior to Visit  Medication Sig Dispense Refill  . carvedilol (COREG) 12.5 MG tablet Take 1 tablet (12.5 mg total) by mouth 2 (two) times daily with a meal. Dose increase  60 tablet  3  . furosemide (LASIX) 20 MG tablet Take 1 tablet (20 mg total) by mouth daily.  30 tablet  3  . lisinopril (PRINIVIL,ZESTRIL) 10 MG tablet Take 10 mg by mouth daily.        . Multiple Vitamins-Minerals (MULTIVITAMIN WITH MINERALS) tablet Take 1 tablet by mouth daily.           Allergies  Allergen Reactions  . Ibuprofen Hives      Past medical history, social history, and family history reviewed and updated.  PHYSICAL EXAM: BP 117/66  Pulse 83  Ht 5\' 9"  (1.753 m)  Wt 54.885 kg (121 lb)  BMI 17.87 kg/m2  SpO2 94%  General-Well developed; no acute distress Body habitus-thin Neck-No JVD, no carotid bruits Lungs: clear lung fields; normal I:E ratio Abdomen-normal bowel sounds; soft and non-tender without masses or organomegaly Skin-Warm, no significant lesions Extremities-Nl distal pulses; no edema; benign right femoral catheterization site   ASSESSMENT AND PLAN:

## 2011-01-05 NOTE — Patient Instructions (Signed)
**Note De-identified  Obfuscation** Your physician recommends that you continue on your current medications as directed. Please refer to the Current Medication list given to you today.  Your physician recommends that you schedule a follow-up appointment in: 6 weeks  

## 2011-01-05 NOTE — Assessment & Plan Note (Addendum)
Patient is doing well with current medication.  Symptoms are controlled; hemodynamics are good; and, there has been modest recovery in LV function.  I will make no modification of his medical therapy and plan to see this nice gentleman again in 6 weeks.

## 2011-01-08 ENCOUNTER — Encounter: Payer: Self-pay | Admitting: *Deleted

## 2011-01-08 ENCOUNTER — Other Ambulatory Visit: Payer: Self-pay | Admitting: Internal Medicine

## 2011-01-09 ENCOUNTER — Ambulatory Visit: Payer: Medicaid Other | Admitting: Cardiology

## 2011-01-10 ENCOUNTER — Telehealth: Payer: Self-pay | Admitting: *Deleted

## 2011-01-10 MED ORDER — LISINOPRIL 10 MG PO TABS: 10.0000 mg | ORAL_TABLET | Freq: Every day | ORAL | Status: DC

## 2011-01-10 NOTE — Telephone Encounter (Signed)
rx escribed to pharmacy 

## 2011-01-19 ENCOUNTER — Ambulatory Visit (INDEPENDENT_AMBULATORY_CARE_PROVIDER_SITE_OTHER): Payer: Medicaid Other | Admitting: Adult Health

## 2011-01-19 ENCOUNTER — Encounter: Payer: Self-pay | Admitting: Adult Health

## 2011-01-19 DIAGNOSIS — I429 Cardiomyopathy, unspecified: Secondary | ICD-10-CM

## 2011-01-19 DIAGNOSIS — I428 Other cardiomyopathies: Secondary | ICD-10-CM

## 2011-01-19 DIAGNOSIS — R55 Syncope and collapse: Secondary | ICD-10-CM

## 2011-01-19 DIAGNOSIS — I420 Dilated cardiomyopathy: Secondary | ICD-10-CM

## 2011-01-19 NOTE — Patient Instructions (Signed)
**Note De-Identified  Obfuscation** Your physician has recommended that you wear a holter monitor. Holter monitors are medical devices that record the heart's electrical activity. Doctors most often use these monitors to diagnose arrhythmias. Arrhythmias are problems with the speed or rhythm of the heartbeat. The monitor is a small, portable device. You can wear one while you do your normal daily activities. This is usually used to diagnose what is causing palpitations/syncope (passing out).  You have been referred to Dr. Ladona Ridgel

## 2011-01-20 ENCOUNTER — Encounter: Payer: Self-pay | Admitting: Adult Health

## 2011-01-20 NOTE — Progress Notes (Signed)
HPI:  [name] is a 40 y/o patient of Dr Dietrich Pates we are following for nonischemic CM, probable ETOH etiology, with EF of 25% with recent cardiac cath 01/02/2011, demonstrating normal coronaries, good cardiac output and normal intracardiac pressures.  He comes today after being seen 2 weeks ago after having 3 episodes of blacking out.Two occurred at work and one at home. He did not experience chest pain, palpitations or dyspnea associated with these.  No seizure activity was noted.  He states he simply lost his vision for about 15 seconds, once ending up on the floor at work. His boss has taken him out of work until we evaluate him further. He is medically compliant.  Allergies  Allergen Reactions  . Ibuprofen Hives    Current Outpatient Prescriptions  Medication Sig Dispense Refill  . aspirin 81 MG tablet Take 81 mg by mouth daily.        . carvedilol (COREG) 12.5 MG tablet Take 1 tablet (12.5 mg total) by mouth 2 (two) times daily with a meal. Dose increase  60 tablet  3  . furosemide (LASIX) 20 MG tablet Take 1 tablet (20 mg total) by mouth daily.  30 tablet  3  . lisinopril (PRINIVIL,ZESTRIL) 10 MG tablet Take 1 tablet (10 mg total) by mouth daily.  30 tablet  9  . Multiple Vitamins-Minerals (MULTIVITAMIN WITH MINERALS) tablet Take 1 tablet by mouth daily.          Past Medical History  Diagnosis Date  . Cardiomyopathy     EF of 15%; improves to 25% after initiation of medical therapy; presented with congestive heart failure    Past Surgical History  Procedure Date  . Nephrectomy     wilms tumor    RUE:AVWUJW of systems complete and found to be negative unless listed above PHYSICAL EXAM BP 120/83  Pulse 74  Ht 5\' 9"  (1.753 m)  Wt 121 lb (54.885 kg)  BMI 17.87 kg/m2  SpO2 96% General: Well developed, well nourished, in no acute distress Head: Eyes PERRLA, No xanthomas.   Normal cephalic and atramatic  Lungs: Clear bilaterally to auscultation and percussion. Heart: HRRR S1  S2,   Pulses are 2+ & equal.            No carotid bruit. No JVD.  No abdominal bruits. No femoral bruits. Abdomen: Bowel sounds are positive, abdomen soft and non-tender without masses or                  Hernia's noted. Msk:  Back normal, normal gait. Normal strength and tone for age. Extremities: No clubbing, cyanosis or edema.  DP +1 Neuro: Alert and oriented X 3. Psych:  Good affect, responds appropriately  EKG:NSR rate of 67 bpm.  Possible LAE.  ASSESSMENT AND PLAN

## 2011-01-20 NOTE — Assessment & Plan Note (Signed)
I am concerned that arrythmia may have precipitated his "blackout spells" in the setting of CM with recent EF of 25%. Orthostatics have been completed and are normal.  He has no documentation of seizures.   I will place a holter monitor on him for 48 hours to assess for arrythmia, VT.  He is to see Dr. Lewayne Bunting for evaluation and discussion for need for ICD.  I have discussed this with Dr. Dietrich Pates. We will not adjust his medications at this time.  Review of recent labs did not demonstrate hypokalemia.   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  We have given him an out of work letter until seen by Dr. Ladona Ridgel.  We will follow after Dr. Lubertha Basque evaluation and recommendations.

## 2011-01-24 ENCOUNTER — Ambulatory Visit (HOSPITAL_COMMUNITY)
Admission: RE | Admit: 2011-01-24 | Discharge: 2011-01-24 | Disposition: A | Discharge status: Home / Self Care | Payer: Medicaid Other | Source: Ambulatory Visit | Attending: Cardiology | Admitting: Cardiology

## 2011-01-24 DIAGNOSIS — R55 Syncope and collapse: Secondary | ICD-10-CM | POA: Insufficient documentation

## 2011-01-24 NOTE — Progress Notes (Signed)
*  PRELIMINARY RESULTS* Echocardiogram 48H Holter monitor has been performed.  Conrad Silerton 01/24/2011, 1:00 PM

## 2011-01-25 ENCOUNTER — Encounter: Payer: Self-pay | Admitting: *Deleted

## 2011-01-26 ENCOUNTER — Ambulatory Visit (INDEPENDENT_AMBULATORY_CARE_PROVIDER_SITE_OTHER): Payer: Medicaid Other | Admitting: Internal Medicine

## 2011-01-26 ENCOUNTER — Encounter: Payer: Self-pay | Admitting: Internal Medicine

## 2011-01-26 DIAGNOSIS — I5022 Chronic systolic (congestive) heart failure: Secondary | ICD-10-CM

## 2011-01-26 DIAGNOSIS — I5020 Unspecified systolic (congestive) heart failure: Secondary | ICD-10-CM

## 2011-01-26 DIAGNOSIS — I951 Orthostatic hypotension: Secondary | ICD-10-CM

## 2011-01-26 NOTE — Assessment & Plan Note (Signed)
His current symptoms are class 1-2. He is on maximal medical therapy. His major limitation for up titration of medications has been orthostasis. His left ventricular ejection fraction has improved with medical therapy. I have strongly counseled him to stop drinking alcohol altogether. While ICD therapy was discussed and would be strongly considered, his young age makes me think we should wait to see if his left ventricular ejection fraction improves before proceeding with ICD implantation. The patient tells him that he has worn a cardiac monitor. If he has long runs of nonsustained ventricular tachycardia then proceed with ICD implantation would be more strongly considered.

## 2011-01-26 NOTE — Assessment & Plan Note (Signed)
I strongly suspect that his near syncopal episodes are related entirely to orthostasis. I've encouraged the patient to move a bit slower.

## 2011-01-26 NOTE — Progress Notes (Signed)
HPI [name] is referred today by Dr. Dietrich Pates for evaluation of a nonischemic cardiomyopathy, EF 25%, near syncopal spells, in the setting of alcohol abuse. The patient was diagnosed with congestive heart failure in May of 2012. Initially his ejection fraction was 15%. Catheterization was carried out which demonstrated normal coronary arteries. The patient states that his daily alcohol consumption was 6-8 beers per day. He was placed on medical therapy and told to stop drinking alcohol. Repeat echo 2 months later demonstrated improvement in his left ventricular ejection fraction 25%. The patient has class 1-2 symptoms at present. He does note episodes of near syncope which have been characterized as standing up quickly and feeling like the room was going to turn black. These episodes resolved within a few seconds. There is no associated palpitations. By his report he has never had frank syncope. He denies chest pain. Upright of his medical therapy has been limited by his orthostatic symptoms. Allergies  Allergen Reactions  . Ibuprofen Hives     Current Outpatient Prescriptions  Medication Sig Dispense Refill  . aspirin 81 MG tablet Take 81 mg by mouth daily.        . carvedilol (COREG) 12.5 MG tablet Take 1 tablet (12.5 mg total) by mouth 2 (two) times daily with a meal. Dose increase  60 tablet  3  . furosemide (LASIX) 20 MG tablet Take 1 tablet (20 mg total) by mouth daily.  30 tablet  3  . lisinopril (PRINIVIL,ZESTRIL) 10 MG tablet Take 1 tablet (10 mg total) by mouth daily.  30 tablet  9  . Multiple Vitamins-Minerals (MULTIVITAMIN WITH MINERALS) tablet Take 1 tablet by mouth daily.           Past Medical History  Diagnosis Date  . Cardiomyopathy     EF of 15%; improves to 25% after initiation of medical therapy; presented with congestive heart failure  . Systolic heart failure   . Orthostatic hypotension     ROS:   All systems reviewed and negative except as noted in the  HPI.   Past Surgical History  Procedure Date  . Nephrectomy     wilms tumor     No family history on file.   History   Social History  . Marital Status: Legally Separated    Spouse Name: N/A    Number of Children: 1  . Years of Education: N/A   Occupational History  . full time     Engineer, petroleum   Social History Main Topics  . Smoking status: Former Games developer  . Smokeless tobacco: Never Used  . Alcohol Use: Yes  . Drug Use: No  . Sexually Active: Not on file   Other Topics Concern  . Not on file   Social History Narrative  . No narrative on file     BP 100/58  Pulse 70  Ht 5\' 9"  (1.753 m)  Wt 121 lb (54.885 kg)  BMI 17.87 kg/m2  Physical Exam:  Well appearing NAD HEENT: Unremarkable Neck:  No JVD, no thyromegally Lymphatics:  No adenopathy Back:  No CVA tenderness Lungs:  Clear with no wheezes, rales, or rhonchi. HEART:  Regular rate rhythm, no murmurs, no rubs, no clicks. PMI is enlarged and laterally displaced. Abd:  Scaphoid, soft, positive bowel sounds, no organomegally, no rebound, no guarding Ext:  2 plus pulses, no edema, no cyanosis, no clubbing Skin:  No rashes no nodules Neuro:  CN II through XII intact, motor grossly intact  Assess/Plan:

## 2011-01-26 NOTE — Patient Instructions (Signed)
Your physician recommends that you schedule a follow-up appointment in: 3 months with Dr Ladona Ridgel  Your physician has requested that you have an echocardiogram. Echocardiography is a painless test that uses sound waves to create images of your heart. It provides your doctor with information about the size and shape of your heart and how well your heart's chambers and valves are working. This procedure takes approximately one hour. There are no restrictions for this procedure.--- in 3 months prior to appt with Dr Ladona Ridgel

## 2011-01-27 NOTE — Procedures (Signed)
[name], [name] NO.:  [id number]  MEDICAL RECORD NO.:  [id number]  LOCATION:  CARDIOPU                      FACILITY:  [facility]  PHYSICIAN:  Gerrit Friends. Dietrich Pates, MD, FACCDATE OF BIRTH:  19-Mar-1971  DATE OF PROCEDURE:  01/24/2011 DATE OF DISCHARGE:  01/25/2011                               HOLTER MONITOR   CLINICAL DATA:  This is a 40 year old gentleman with syncope.  FINDINGS: 1. Continuous electrocardiographic monitoring was maintained for 47     hours and 46 minutes during which the predominant rhythm was normal     sinus with sinus bradycardia to rates as low as 42 bpm and sinus     tachycardia to a peak rate of 114 bpm. 2. Rare premature ventricular depolarizations and premature atrial     depolarizations occurred, each recorded at an average rate of fewer     than 2 per hour.  No significant arrhythmias were identified. 3. No ST-segment elevation or depression was noted. 4. A single episode of dizziness was reported during which the rhythm     was normal sinus.     Gerrit Friends. Dietrich Pates, MD, [facility]     RMR/MEDQ  D:  01/27/2011  T:  01/27/2011  Job:  [id number]

## 2011-01-28 ENCOUNTER — Other Ambulatory Visit: Payer: Self-pay | Admitting: Cardiology

## 2011-01-28 DIAGNOSIS — I420 Dilated cardiomyopathy: Secondary | ICD-10-CM

## 2011-01-31 ENCOUNTER — Telehealth: Payer: Self-pay | Admitting: Cardiology

## 2011-01-31 ENCOUNTER — Other Ambulatory Visit (HOSPITAL_COMMUNITY): Payer: Medicaid Other | Admitting: Radiology

## 2011-01-31 NOTE — Telephone Encounter (Signed)
PT NEEDS A LETTER STATING HE CAN GO BACK TO WORK HE IS STARTING BACK Monday 02/05/11/TMJ

## 2011-01-31 NOTE — Progress Notes (Signed)
Addended by: Augusto Gamble on: 01/31/2011 01:11 PM   Modules accepted: Orders

## 2011-02-16 ENCOUNTER — Encounter: Payer: Self-pay | Admitting: Cardiology

## 2011-02-16 ENCOUNTER — Encounter: Payer: Self-pay | Admitting: *Deleted

## 2011-02-16 ENCOUNTER — Ambulatory Visit (INDEPENDENT_AMBULATORY_CARE_PROVIDER_SITE_OTHER): Payer: Medicaid Other | Admitting: Cardiology

## 2011-02-16 DIAGNOSIS — I420 Dilated cardiomyopathy: Secondary | ICD-10-CM

## 2011-02-16 DIAGNOSIS — I428 Other cardiomyopathies: Secondary | ICD-10-CM

## 2011-02-16 NOTE — Assessment & Plan Note (Addendum)
[name] is asymptomatic with current medical therapy.  Although optimal medical therapy would require an increase in dosage of most of his meds, he is doing so well that I am not inclined to make any medication changes at this visit.  Left ventricular systolic function will be assessed in 2 months just prior to a planned return visit to Dr. Ladona Ridgel for consideration of an AICD implant.  I will see this nice gentleman again in 4 months.  In the interim, we will monitor electrolytes and renal function.

## 2011-02-16 NOTE — Patient Instructions (Signed)
Your physician recommends that you schedule a follow-up appointment in: 4 months  Your physician recommends that you return for lab work in: 1 month (October) and 3 months (December)  Your physician has requested that you have an echocardiogram. Echocardiography is a painless test that uses sound waves to create images of your heart. It provides your doctor with information about the size and shape of your heart and how well your heart's chambers and valves are working. This procedure takes approximately one hour. There are no restrictions for this procedure. (MID NOVEMBER)

## 2011-02-16 NOTE — Progress Notes (Signed)
HPI : [name] returns to the office for continued assessment and treatment of cardiomyopathy and for a release to allow him to return to work.  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  Current Outpatient Prescriptions on File Prior to Visit  Medication Sig Dispense Refill  . aspirin 81 MG tablet Take 81 mg by mouth daily.        . carvedilol (COREG) 12.5 MG tablet Take 1 tablet (12.5 mg total) by mouth 2 (two) times daily with a meal. Dose increase  60 tablet  3  . furosemide (LASIX) 20 MG tablet Take 1 tablet (20 mg total) by mouth daily.  30 tablet  3  . lisinopril (PRINIVIL,ZESTRIL) 10 MG tablet Take 1 tablet (10 mg total) by mouth daily.  30 tablet  9  . Multiple Vitamins-Minerals (MULTIVITAMIN WITH MINERALS) tablet Take 1 tablet by mouth daily.           Allergies  Allergen Reactions  . Ibuprofen Hives      Past medical history, social history, and family history reviewed and updated.  ROS: Denies orthopnea, PND, lightheadedness, syncope, cough and sputum production.  PHYSICAL EXAM: BP 113/72  Pulse 55  Resp 18  Ht 5\' 9"  (1.753 m)  Wt 54.432 kg (120 lb)  BMI 17.72 kg/m2  SpO2 100%  General-Well developed; no acute distress Body habitus-thin Neck-No JVD; no carotid bruits Lungs-clear lung fields; resonant to percussion Cardiovascular-normal PMI; normal S1; increased intensity the second heart sound; fourth heart sound present Abdomen-normal bowel sounds; soft and non-tender without masses or organomegaly Musculoskeletal-No deformities, no cyanosis or clubbing Neurologic-Normal cranial nerves; symmetric strength and tone Skin-Warm, no significant lesions Extremities-distal pulses intact; no edema  ASSESSMENT AND PLAN:

## 2011-05-10 ENCOUNTER — Encounter: Payer: Self-pay | Admitting: Internal Medicine

## 2011-05-10 ENCOUNTER — Ambulatory Visit (INDEPENDENT_AMBULATORY_CARE_PROVIDER_SITE_OTHER): Payer: Medicaid Other | Admitting: Internal Medicine

## 2011-05-10 ENCOUNTER — Ambulatory Visit (HOSPITAL_COMMUNITY): Payer: Medicaid Other | Attending: Internal Medicine | Admitting: Radiology

## 2011-05-10 VITALS — BP 122/80 | HR 80 | Ht 69.0 in | Wt 123.1 lb

## 2011-05-10 DIAGNOSIS — I5022 Chronic systolic (congestive) heart failure: Secondary | ICD-10-CM

## 2011-05-10 DIAGNOSIS — I428 Other cardiomyopathies: Secondary | ICD-10-CM

## 2011-05-10 DIAGNOSIS — I509 Heart failure, unspecified: Secondary | ICD-10-CM

## 2011-05-10 DIAGNOSIS — I079 Rheumatic tricuspid valve disease, unspecified: Secondary | ICD-10-CM | POA: Insufficient documentation

## 2011-05-10 NOTE — Patient Instructions (Signed)

## 2011-05-10 NOTE — Progress Notes (Signed)
HPI [name] returns today for followup. He is a very pleasant 40 year old man with a nonischemic cardiomyopathy, chronic class II systolic heart failure despite maximal medical therapy, hypertension, and tobacco abuse. The patient's ejection fraction was initially 15% and subsequent echo revealed that it had increased to 25%. I saw the patient approximately 3 months ago and recommended that he undergo a period of watchful waiting and that we uptitrate his medical therapy and that we repeat his echo. He returns today for followup. His 2-D echo was performed earlier today. The patient denies syncope. His CHF remains class 2. Allergies  Allergen Reactions  . Ibuprofen Hives     Current Outpatient Prescriptions  Medication Sig Dispense Refill  . aspirin 81 MG tablet Take 81 mg by mouth daily.        . carvedilol (COREG) 12.5 MG tablet Take 1 tablet (12.5 mg total) by mouth 2 (two) times daily with a meal. Dose increase  60 tablet  3  . furosemide (LASIX) 20 MG tablet Take 1 tablet (20 mg total) by mouth daily.  30 tablet  3  . lisinopril (PRINIVIL,ZESTRIL) 10 MG tablet Take 1 tablet (10 mg total) by mouth daily.  30 tablet  9  . Multiple Vitamins-Minerals (MULTIVITAMIN WITH MINERALS) tablet Take 1 tablet by mouth daily.           Past Medical History  Diagnosis Date  . Cardiomyopathy     EF of 15%; improves to 25% after initiation of medical therapy; presented with congestive heart failure  . Systolic heart failure   . Orthostatic hypotension     ROS:   All systems reviewed and negative except as noted in the HPI.   Past Surgical History  Procedure Date  . Nephrectomy     wilms tumor     No family history on file.   History   Social History  . Marital Status: Legally Separated    Spouse Name: N/A    Number of Children: 1  . Years of Education: N/A   Occupational History  . full time     Engineer, petroleum   Social History Main Topics  . Smoking status: Former  Games developer  . Smokeless tobacco: Never Used  . Alcohol Use: Yes  . Drug Use: No  . Sexually Active: Not on file   Other Topics Concern  . Not on file   Social History Narrative  . No narrative on file     BP 122/80  Pulse 80  Ht 5\' 9"  (1.753 m)  Wt 55.847 kg (123 lb 1.9 oz)  BMI 18.18 kg/m2  Physical Exam:  Well appearing young man, NAD HEENT: Unremarkable Neck:  No JVD, no thyromegally Lungs:  Clear with no wheezes, rales, or rhonchi. HEART:  Regular rate rhythm, no murmurs, no rubs, no clicks. A prominent S3 is present in the PMI is enlarged and laterally displaced. Abd:  soft, positive bowel sounds, no organomegally, no rebound, no guarding Ext:  2 plus pulses, no edema, no cyanosis, no clubbing Skin:  No rashes no nodules Neuro:  CN II through XII intact, motor grossly intact  Assess/Plan:

## 2011-05-16 ENCOUNTER — Other Ambulatory Visit: Payer: Self-pay | Admitting: *Deleted

## 2011-05-16 ENCOUNTER — Encounter: Payer: Self-pay | Admitting: *Deleted

## 2011-05-17 ENCOUNTER — Encounter (HOSPITAL_COMMUNITY): Payer: Self-pay

## 2011-05-21 ENCOUNTER — Other Ambulatory Visit: Payer: Self-pay | Admitting: Cardiology

## 2011-05-21 ENCOUNTER — Other Ambulatory Visit: Payer: Self-pay | Admitting: *Deleted

## 2011-05-21 ENCOUNTER — Other Ambulatory Visit: Payer: Self-pay | Admitting: Internal Medicine

## 2011-05-21 ENCOUNTER — Telehealth: Payer: Self-pay | Admitting: Internal Medicine

## 2011-05-21 DIAGNOSIS — I428 Other cardiomyopathies: Secondary | ICD-10-CM

## 2011-05-21 LAB — CBC WITH DIFFERENTIAL/PLATELET
HCT: 46.5 % (ref 39.0–52.0)
Hemoglobin: 15.8 g/dL (ref 13.0–17.0)
Lymphocytes Relative: 31 % (ref 12–46)
Lymphs Abs: 1.5 10*3/uL (ref 0.7–4.0)
Monocytes Absolute: 0.7 10*3/uL (ref 0.1–1.0)
Monocytes Relative: 15 % — ABNORMAL HIGH (ref 3–12)
Neutro Abs: 2.4 10*3/uL (ref 1.7–7.7)
RBC: 4.8 MIL/uL (ref 4.22–5.81)
WBC: 4.9 10*3/uL (ref 4.0–10.5)

## 2011-05-21 LAB — BASIC METABOLIC PANEL
CO2: 26 mEq/L (ref 19–32)
Chloride: 101 mEq/L (ref 96–112)
Sodium: 137 mEq/L (ref 135–145)

## 2011-05-21 NOTE — Telephone Encounter (Signed)
New problem:   patient is schedule for procedure on Friday morning . What time does he need to be at hospital ?

## 2011-05-21 NOTE — Telephone Encounter (Signed)
Received call from [facility] lab in [place]. Patient there without orders. Rhetta Mura that he needs a cbc with dif,bmet, and INR. [phone]. Called her back at [phone] and faxed order to [phone]. Verlon Au states that there office is not on the EPIC system so I needed to manually fax this order.

## 2011-05-21 NOTE — Telephone Encounter (Signed)
Patient aware to be at [facility] at 730am for a 930am procedure.

## 2011-05-22 LAB — PROTIME-INR
INR: 0.91 (ref <1.50)
Prothrombin Time: 12.6 seconds (ref 11.6–15.2)

## 2011-05-25 ENCOUNTER — Ambulatory Visit (HOSPITAL_COMMUNITY)
Admission: RE | Admit: 2011-05-25 | Discharge: 2011-05-26 | Disposition: A | Discharge status: Home / Self Care | Payer: Medicaid Other | Source: Ambulatory Visit | Attending: Internal Medicine | Admitting: Internal Medicine

## 2011-05-25 ENCOUNTER — Encounter (HOSPITAL_COMMUNITY): Payer: Self-pay | Admitting: General Practice

## 2011-05-25 ENCOUNTER — Encounter (HOSPITAL_COMMUNITY)
Admission: RE | Disposition: A | Discharge status: Home / Self Care | Payer: Self-pay | Source: Ambulatory Visit | Attending: Internal Medicine

## 2011-05-25 DIAGNOSIS — R55 Syncope and collapse: Secondary | ICD-10-CM

## 2011-05-25 DIAGNOSIS — I5022 Chronic systolic (congestive) heart failure: Secondary | ICD-10-CM

## 2011-05-25 DIAGNOSIS — I509 Heart failure, unspecified: Secondary | ICD-10-CM | POA: Insufficient documentation

## 2011-05-25 DIAGNOSIS — I420 Dilated cardiomyopathy: Secondary | ICD-10-CM

## 2011-05-25 DIAGNOSIS — I10 Essential (primary) hypertension: Secondary | ICD-10-CM | POA: Insufficient documentation

## 2011-05-25 DIAGNOSIS — F17200 Nicotine dependence, unspecified, uncomplicated: Secondary | ICD-10-CM | POA: Insufficient documentation

## 2011-05-25 DIAGNOSIS — I428 Other cardiomyopathies: Secondary | ICD-10-CM | POA: Insufficient documentation

## 2011-05-25 HISTORY — PX: OTHER SURGICAL HISTORY: SHX169

## 2011-05-25 HISTORY — PX: IMPLANTABLE CARDIOVERTER DEFIBRILLATOR IMPLANT: SHX5473

## 2011-05-25 SURGERY — IMPLANTABLE CARDIOVERTER DEFIBRILLATOR IMPLANT
Anesthesia: LOCAL

## 2011-05-25 MED ORDER — SODIUM CHLORIDE 0.9 % IV SOLN: INTRAVENOUS | Status: DC

## 2011-05-25 MED ORDER — INFLUENZA VIRUS VACC SPLIT PF IM SUSP
0.5000 mL | INTRAMUSCULAR | Status: AC
  Administered 2011-05-26: 0.5 mL via INTRAMUSCULAR
  Filled 2011-05-25: qty 0.5

## 2011-05-25 MED ORDER — CARVEDILOL 12.5 MG PO TABS
12.5000 mg | ORAL_TABLET | Freq: Two times a day (BID) | ORAL | Status: DC
  Administered 2011-05-25 – 2011-05-26 (×2): 12.5 mg via ORAL
  Filled 2011-05-25 (×5): qty 1

## 2011-05-25 MED ORDER — MIDAZOLAM HCL 5 MG/5ML IJ SOLN
INTRAMUSCULAR | Status: AC
  Filled 2011-05-25: qty 5

## 2011-05-25 MED ORDER — ASPIRIN 81 MG PO CHEW
81.0000 mg | CHEWABLE_TABLET | Freq: Every day | ORAL | Status: DC
  Administered 2011-05-25 – 2011-05-26 (×2): 81 mg via ORAL
  Filled 2011-05-25 (×3): qty 1

## 2011-05-25 MED ORDER — SODIUM CHLORIDE 0.45 % IV SOLN
INTRAVENOUS | Status: DC
  Administered 2011-05-25: 08:00:00 via INTRAVENOUS

## 2011-05-25 MED ORDER — CEFAZOLIN SODIUM 1-5 GM-% IV SOLN
1.0000 g | Freq: Three times a day (TID) | INTRAVENOUS | Status: DC
  Administered 2011-05-25 – 2011-05-26 (×3): 1 g via INTRAVENOUS
  Filled 2011-05-25 (×5): qty 50

## 2011-05-25 MED ORDER — ACETAMINOPHEN 325 MG PO TABS: 325.0000 mg | ORAL_TABLET | ORAL | Status: DC | PRN

## 2011-05-25 MED ORDER — CEFAZOLIN SODIUM 1-5 GM-% IV SOLN: 1.0000 g | INTRAVENOUS | Status: DC

## 2011-05-25 MED ORDER — MUPIROCIN 2 % EX OINT
TOPICAL_OINTMENT | CUTANEOUS | Status: AC
  Administered 2011-05-25: 1
  Filled 2011-05-25: qty 22

## 2011-05-25 MED ORDER — FENTANYL CITRATE 0.05 MG/ML IJ SOLN
INTRAMUSCULAR | Status: AC
  Filled 2011-05-25: qty 2

## 2011-05-25 MED ORDER — CHLORHEXIDINE GLUCONATE 4 % EX LIQD
60.0000 mL | Freq: Once | CUTANEOUS | Status: DC
  Filled 2011-05-25: qty 60

## 2011-05-25 MED ORDER — SODIUM CHLORIDE 0.9 % IV SOLN: 250.0000 mL | INTRAVENOUS | Status: AC | PRN

## 2011-05-25 MED ORDER — LISINOPRIL 10 MG PO TABS
10.0000 mg | ORAL_TABLET | Freq: Every day | ORAL | Status: DC
Start: 2011-05-25 — End: 2011-05-26
  Administered 2011-05-25 – 2011-05-26 (×2): 10 mg via ORAL
  Filled 2011-05-25 (×2): qty 1

## 2011-05-25 MED ORDER — ONDANSETRON HCL 4 MG/2ML IJ SOLN: 4.0000 mg | Freq: Four times a day (QID) | INTRAMUSCULAR | Status: DC | PRN

## 2011-05-25 MED ORDER — ACETAMINOPHEN 500 MG PO TABS
1000.0000 mg | ORAL_TABLET | Freq: Four times a day (QID) | ORAL | Status: AC
  Administered 2011-05-25: 1000 mg via ORAL
  Filled 2011-05-25 (×4): qty 2

## 2011-05-25 MED ORDER — FUROSEMIDE 20 MG PO TABS
20.0000 mg | ORAL_TABLET | Freq: Every day | ORAL | Status: DC
  Administered 2011-05-25 – 2011-05-26 (×2): 20 mg via ORAL
  Filled 2011-05-25 (×2): qty 1

## 2011-05-25 MED ORDER — MUPIROCIN 2 % EX OINT
TOPICAL_OINTMENT | Freq: Two times a day (BID) | CUTANEOUS | Status: DC
Start: 2011-05-25 — End: 2011-05-25
  Filled 2011-05-25: qty 22

## 2011-05-25 MED ORDER — SODIUM CHLORIDE 0.9 % IR SOLN
80.0000 mg | Status: DC
  Filled 2011-05-25: qty 2

## 2011-05-25 NOTE — H&P (View-Only) (Signed)
HPI [name] returns today for followup. He is a very pleasant 40-year-old man with a nonischemic cardiomyopathy, chronic class II systolic heart failure despite maximal medical therapy, hypertension, and tobacco abuse. The patient's ejection fraction was initially 15% and subsequent echo revealed that it had increased to 25%. I saw the patient approximately 3 months ago and recommended that he undergo a period of watchful waiting and that we uptitrate his medical therapy and that we repeat his echo. He returns today for followup. His 2-D echo was performed earlier today. The patient denies syncope. His CHF remains class 2. Allergies  Allergen Reactions  . Ibuprofen Hives     Current Outpatient Prescriptions  Medication Sig Dispense Refill  . aspirin 81 MG tablet Take 81 mg by mouth daily.        . carvedilol (COREG) 12.5 MG tablet Take 1 tablet (12.5 mg total) by mouth 2 (two) times daily with a meal. Dose increase  60 tablet  3  . furosemide (LASIX) 20 MG tablet Take 1 tablet (20 mg total) by mouth daily.  30 tablet  3  . lisinopril (PRINIVIL,ZESTRIL) 10 MG tablet Take 1 tablet (10 mg total) by mouth daily.  30 tablet  9  . Multiple Vitamins-Minerals (MULTIVITAMIN WITH MINERALS) tablet Take 1 tablet by mouth daily.           Past Medical History  Diagnosis Date  . Cardiomyopathy     EF of 15%; improves to 25% after initiation of medical therapy; presented with congestive heart failure  . Systolic heart failure   . Orthostatic hypotension     ROS:   All systems reviewed and negative except as noted in the HPI.   Past Surgical History  Procedure Date  . Nephrectomy     wilms tumor     No family history on file.   History   Social History  . Marital Status: Legally Separated    Spouse Name: N/A    Number of Children: 1  . Years of Education: N/A   Occupational History  . full time     machine mechanic   Social History Main Topics  . Smoking status: Former  Smoker  . Smokeless tobacco: Never Used  . Alcohol Use: Yes  . Drug Use: No  . Sexually Active: Not on file   Other Topics Concern  . Not on file   Social History Narrative  . No narrative on file     BP 122/80  Pulse 80  Ht 5' 9" (1.753 m)  Wt 55.847 kg (123 lb 1.9 oz)  BMI 18.18 kg/m2  Physical Exam:  Well appearing young man, NAD HEENT: Unremarkable Neck:  No JVD, no thyromegally Lungs:  Clear with no wheezes, rales, or rhonchi. HEART:  Regular rate rhythm, no murmurs, no rubs, no clicks. A prominent S3 is present in the PMI is enlarged and laterally displaced. Abd:  soft, positive bowel sounds, no organomegally, no rebound, no guarding Ext:  2 plus pulses, no edema, no cyanosis, no clubbing Skin:  No rashes no nodules Neuro:  CN II through XII intact, motor grossly intact  Assess/Plan:   

## 2011-05-25 NOTE — Op Note (Signed)
NAMELATWAN, LUCHSINGER NO.:  [id number]  MEDICAL RECORD NO.:  [id number]  LOCATION:  3740                         FACILITY:  [facility]  PHYSICIAN:  Doylene Canning. Ladona Ridgel, MD    DATE OF BIRTH:  07-Aug-1970  DATE OF PROCEDURE:  05/25/2011 DATE OF DISCHARGE:                              OPERATIVE REPORT   PROCEDURE PERFORMED:  Insertion of a single chamber single coil active fixation defibrillator.  INDICATION:  Nonischemic cardiomyopathy, chronic class II congestive heart failure, ejection fraction 25% despite maximal medical therapy with carvedilol, furosemide and lisinopril.  INTRODUCTION:  The patient is a very pleasant 40 year old man who has a longstanding nonischemic cardiomyopathy.  Initially, his ejection fraction was 15%.  Repeat echo after maximal medical therapy demonstrated an EF of 25%.  He has class II heart failure symptoms and is now referred for prophylactic ICD implantation.  PROCEDURE:  After informed consent was obtained, the patient was taken to the diagnostic EP lab in a fasting state.  After usual preparation and draping, intravenous fentanyl and midazolam was  given for sedation. Lidocaine 30 mL was infiltrated into the left infraclavicular region.  A 7 cm incision was carried out over this region, and electrocautery was utilized to dissect down to the fascial plane.  The left subclavian vein was punctured and the [employer] SG active fixation Gore-Tex defibrillation lead, serial number [id number] was advanced into the right ventricle where mapping was carried out.  At the final site, the R-waves measured 11 mV, the pacing impedance was 700 ohms, and threshold was a V at 0.5 msec.  A 10 V pacing did not stimulate the diaphragm.  With the lead actively fixed, there was a large injury current.  With these satisfactory parameters, the lead was secured to the subpectoral fascia with a figure-of-eight silk suture.  A 10 V pacing  did not stimulate the diaphragm.  The sewing sleeve was secured with silk suture.  Electrocautery was utilized to make a subcutaneous pocket.  Antibiotic irrigation was utilized to irrigate the pocket, and electrocautery was utilized to assure hemostasis.  The [employer] single chamber defibrillator, serial number [id number] was connected to the defibrillation lead and placed back in the subcutaneous pocket.  The pocket was irrigated with additional irrigation, and the incision was closed with 2-0 and 3-0 Vicryl.  At this point, I scrubbed out and a supervised additional deep sedation of the patient.  With additional fentanyl and Versed, VF was induced with a T-wave shock.  A 14 joules shock was delivered, which terminated ventricular fibrillation and restored sinus rhythm.  At this point, no additional defibrillation threshold testing was carried out, and benzoin and Steri-Strips were painted on the skin, a pressure dressing was applied, and the patient was returned to his room in satisfactory condition.  COMPLICATIONS:  There were no immediate procedure complications.  RESULTS:  Demonstrated successful implantation of a [employer] single chamber defibrillator in a patient with a nonischemic cardiomyopathy, ejection fraction 25% despite maximal medical therapy, with chronic class II congestive heart failure.     Doylene Canning. Ladona Ridgel, MD     GWT/MEDQ  D:  05/25/2011  T:  05/25/2011  Job:  [id number]

## 2011-05-25 NOTE — Op Note (Signed)
ICD implant via left subclavian vein without immediate complication. D# [phone]

## 2011-05-25 NOTE — Interval H&P Note (Signed)
History and Physical Interval Note:  05/25/2011 8:45 AM  [name]  has presented today for surgery, with the diagnosis of Heart Failure  The various methods of treatment have been discussed with the patient and family. After consideration of risks, benefits and other options for treatment, the patient has consented to  Procedure(s): IMPLANTABLE CARDIOVERTER DEFIBRILLATOR IMPLANT as a surgical intervention .  The patients' history has been reviewed, patient examined, no change in status, stable for surgery.  I have reviewed the patients' chart and labs.  Questions were answered to the patient's satisfaction.     Lewayne Bunting

## 2011-05-26 ENCOUNTER — Other Ambulatory Visit: Payer: Self-pay

## 2011-05-26 ENCOUNTER — Ambulatory Visit (HOSPITAL_COMMUNITY): Payer: Medicaid Other

## 2011-05-26 LAB — BASIC METABOLIC PANEL
Calcium: 9.5 mg/dL (ref 8.4–10.5)
GFR calc non Af Amer: >90 mL/min (ref >90)
Glucose, Bld: 146 mg/dL — ABNORMAL HIGH (ref 70–99)
Sodium: 139 mEq/L (ref 135–145)

## 2011-05-26 NOTE — Progress Notes (Signed)
Patient ID: [name], male   DOB: Jul 19, 1970, 40 y.o.   MRN: [id number] [name]  SUBJECTIVE: No complaints.  CXR today with no PTX and good lead position.      Marland Kitchen acetaminophen  1,000 mg Oral Q6H  . aspirin  81 mg Oral Daily  . carvedilol  12.5 mg Oral BID WC  . ceFAZolin (ANCEF) IV  1 g Intravenous Q8H  . furosemide  20 mg Oral Daily  . influenza  inactive virus vaccine  0.5 mL Intramuscular Tomorrow-1000  . lisinopril  10 mg Oral Daily  . DISCONTD: mupirocin ointment   Nasal BID     Filed Vitals:   05/25/11 2000 05/26/11 0000 05/26/11 0400 05/26/11 0847  BP: 101/61 100/63 101/61 113/72  Pulse: 65 55 62 59  Temp: 98.2 F (36.8 C) 97.5 F (36.4 C) 97.7 F (36.5 C) 97.4 F (36.3 C)  TempSrc: Oral Oral Oral Oral  Resp: 16 16 16 18   Height:      Weight:      SpO2: 95% 97% 99% 95%    Intake/Output Summary (Last 24 hours) at 05/26/11 1125 Last data filed at 05/26/11 0300  Gross per 24 hour  Intake      0 ml  Output    500 ml  Net   -500 ml    LABS: Basic Metabolic Panel: No results found for this basename: NA:2,K:2,CL:2,CO2:2,GLUCOSE:2,BUN:2,CREATININE:2,CALCIUM:2,MG:2,PHOS:2 in the last 72 hours  RADIOLOGY: Dg Chest 2 View  05/26/2011  *RADIOLOGY REPORT*  Clinical Data: Status post ICD placement.  CHEST - 2 VIEW  Comparison: On the year and lateral chest 10/31/2010.  Findings: The patient has a new ICD in place the tip of a single lead in the apex the right ventricle.  There is no pneumothorax. Lungs are clear.  Heart size is normal.  No pleural effusion.  IMPRESSION: ICD in good position.  No pneumothorax or acute finding.  Original Report Authenticated By: Bernadene Bell. [name], M.D.    PHYSICAL EXAM General: NAD Neck: No JVD, no thyromegaly or thyroid nodule.  Lungs: Clear to auscultation bilaterally with normal respiratory effort. CV: Nondisplaced PMI.  Heart regular S1/S2, no S3/S4, no murmur.  No peripheral edema.  No carotid bruit.  Normal pedal  pulses.  Abdomen: Soft, nontender, no hepatosplenomegaly, no distention.  Neurologic: Alert and oriented x 3.  Psych: Normal affect. Extremities: No clubbing or cyanosis.  ICD site benign.   ASSESSMENT AND PLAN:  40 yo with history of nonischemic CMP is now s/p ICD implantation.  ICD site looks ok.  CXR shows good lead position, no PTX.  I do note that his K was high on 12/10, so will repeat a BMET this am.  He can be discharged on his current home regimen to followup with Dr. Dietrich Pates.   Marca Ancona 05/26/2011 11:26 AM

## 2011-05-26 NOTE — Discharge Summary (Signed)
Discharge Summary   Patient ID: [name] MRN: [id number], DOB/AGE: 1970-06-18 40 y.o. Admit date: 05/25/2011 D/C date:     05/26/2011   Primary Discharge Diagnoses:  1. Non-ischemic Cardiomyopathy  - EF of 15% improves to 25% after initiation of medical therapy but stayed at 20-25% by echo 05/10/11; presented with congestive heart failure - s/p [employer] ICD implantation 05/25/11  Secondary Discharge Diagnoses:  1. Orthostatic hypotension  2. HTN 3. Tobacco abuse  Hospital Course: [name] has a hx of NICM, HTN, tobacco abuse. Despite max medical therapy, his EF still has remained low at 20-25% by echo 05/10/11.  His CHF remains class 2. It was recommended that he undergo ICD implantation. He was brought in for this procedure 05/25/11 and ultimately had successful implantation of a [employer] single chamber defibrillator device. He tolerated this procedure well. F/u CXR showed no PTX. Note that K was slightly high on last check 12/10 at 5.6; this was repeated today by Dr. Shirlee [name] and was 3.5. Dr. Shirlee [name] has seen and examined him and feels he is stable for discharge.   Discharge Vitals: Blood pressure 113/72, pulse 59, temperature 97.4 F (36.3 C), temperature source Oral, resp. rate 18, height 5\' 9"  (1.753 m), weight 124 lb (56.246 kg), SpO2 95.00%.  Labs: Lab Results  Component Value Date   WBC 4.9 05/21/2011   HGB 15.8 05/21/2011   HCT 46.5 05/21/2011   MCV 96.9 05/21/2011   PLT 249 05/21/2011    Lab 05/26/11 1129  NA 139  K 3.5  CL 102  CO2 27  BUN 9  CREATININE 0.90  CALCIUM 9.5  PROT --  BILITOT --  ALKPHOS --  ALT --  AST --  GLUCOSE 146*    Diagnostic Studies/Procedures   1. Chest 2 View 05/26/2011  *RADIOLOGY REPORT*  Clinical Data: Status post ICD placement.  CHEST - 2 VIEW  Comparison: On the year and lateral chest 10/31/2010.  Findings: The patient has a new ICD in place the tip of a single lead in the apex the right ventricle.   There is no pneumothorax. Lungs are clear.  Heart size is normal.  No pleural effusion.  IMPRESSION: ICD in good position.  No pneumothorax or acute finding.  Original Report Authenticated By: [name]. [name], M.D.    Discharge Medications   Current Discharge Medication List    CONTINUE these medications which have NOT CHANGED   Details  aspirin 81 MG tablet Take 81 mg by mouth daily.      carvedilol (COREG) 12.5 MG tablet TAKE ONE TABLET BY MOUTH TWICE DAILY WITH A MEAL Qty: 60 tablet, Refills: 1    furosemide (LASIX) 20 MG tablet TAKE ONE TABLET BY MOUTH DAILY Qty: 30 tablet, Refills: 1    lisinopril (PRINIVIL,ZESTRIL) 10 MG tablet Take 1 tablet (10 mg total) by mouth daily. Qty: 30 tablet, Refills: 9    Multiple Vitamins-Minerals (MULTIVITAMIN WITH MINERALS) tablet Take 1 tablet by mouth daily.          Disposition   The patient will be discharged in stable condition to home. Discharge Orders    Future Orders Please Complete By Expires   Ambulatory referral to Cardiology      Comments:   ASAP   Diet - low sodium heart healthy      Increase activity slowly      Scheduling Instructions:   Please see attached sheet for instructions on wound care, activity, and bathing.  Follow-up Information    Follow up with [facility]. (Our office will call you to schedule your wound check and follow-up appointments)    Contact information:   [address] [place] [address] [phone]  [place] Office: [phone]           Duration of Discharge Encounter: Greater than 30 minutes including physician and PA time.  Signed, Richard Lanni PA-C 05/26/2011, 1:22 PM

## 2011-05-31 ENCOUNTER — Ambulatory Visit (INDEPENDENT_AMBULATORY_CARE_PROVIDER_SITE_OTHER): Payer: Medicaid Other | Admitting: *Deleted

## 2011-05-31 ENCOUNTER — Encounter: Payer: Self-pay | Admitting: Internal Medicine

## 2011-05-31 DIAGNOSIS — I5022 Chronic systolic (congestive) heart failure: Secondary | ICD-10-CM

## 2011-05-31 LAB — ICD DEVICE OBSERVATION
DEVICE MODEL ICD: 118994
RV LEAD AMPLITUDE: 25 mv
RV LEAD IMPEDENCE ICD: 558 Ohm
VENTRICULAR PACING ICD: 1 pct

## 2011-05-31 NOTE — Progress Notes (Signed)
Wound check-ICD 

## 2011-06-20 ENCOUNTER — Ambulatory Visit (INDEPENDENT_AMBULATORY_CARE_PROVIDER_SITE_OTHER): Payer: Medicaid Other | Admitting: Cardiology

## 2011-06-20 ENCOUNTER — Encounter: Payer: Self-pay | Admitting: *Deleted

## 2011-06-20 ENCOUNTER — Encounter: Payer: Self-pay | Admitting: Cardiology

## 2011-06-20 DIAGNOSIS — I428 Other cardiomyopathies: Secondary | ICD-10-CM

## 2011-06-20 DIAGNOSIS — Z7289 Other problems related to lifestyle: Secondary | ICD-10-CM

## 2011-06-20 DIAGNOSIS — F17200 Nicotine dependence, unspecified, uncomplicated: Secondary | ICD-10-CM

## 2011-06-20 DIAGNOSIS — Z789 Other specified health status: Secondary | ICD-10-CM | POA: Insufficient documentation

## 2011-06-20 DIAGNOSIS — Z0189 Encounter for other specified special examinations: Secondary | ICD-10-CM

## 2011-06-20 DIAGNOSIS — R7301 Impaired fasting glucose: Secondary | ICD-10-CM

## 2011-06-20 DIAGNOSIS — C649 Malignant neoplasm of unspecified kidney, except renal pelvis: Secondary | ICD-10-CM

## 2011-06-20 DIAGNOSIS — Z720 Tobacco use: Secondary | ICD-10-CM | POA: Insufficient documentation

## 2011-06-20 DIAGNOSIS — I420 Dilated cardiomyopathy: Secondary | ICD-10-CM

## 2011-06-20 NOTE — Assessment & Plan Note (Signed)
Patient has quit for up to 3 months in the past without pharmacologic assistance.  He is advised to do so again and to call should that prove unsuccessful.

## 2011-06-20 NOTE — Patient Instructions (Signed)
Your physician recommends that you schedule a follow-up appointment in: 8 months  Your physician has recommended you make the following change in your medication:  Allegra daily or twice a day as needed.  May take Allegra D if this is not effective  Increase potassium in diet  Your physician recommends that you return for lab work in: 2 months (you will receive a letter)

## 2011-06-20 NOTE — Progress Notes (Signed)
Patient ID: [name], male   DOB: Nov 02, 1970, 41 y.o.   MRN: [id number] HPI: Scheduled return visit for this very nice gentleman gentleman with nonischemic cardiomyopathy.  Since initial therapy, he has been essentially asymptomatic.  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  An AICD was placed 3 weeks ago in an uncomplicated procedure and is being followed by our electrophysiology service.  Prior to Admission medications   Medication Sig Start Date End Date Taking? Authorizing Provider  aspirin 81 MG tablet Take 81 mg by mouth daily.     Yes Historical Provider, MD  carvedilol (COREG) 12.5 MG tablet TAKE ONE TABLET BY MOUTH TWICE DAILY WITH A MEAL 05/21/11  Yes Gerrit Friends. Sahory Nordling, MD  furosemide (LASIX) 20 MG tablet TAKE ONE TABLET BY MOUTH DAILY 05/21/11  Yes Gerrit Friends. Glendal Cassaday, MD  lisinopril (PRINIVIL,ZESTRIL) 10 MG tablet Take 1 tablet (10 mg total) by mouth daily. 01/10/11  Yes Gerrit Friends. Giovanni Bath, MD  Multiple Vitamins-Minerals (MULTIVITAMIN WITH MINERALS) tablet Take 1 tablet by mouth daily.     Yes Historical Provider, MD    Allergies  Allergen Reactions  . Ibuprofen Hives  Past medical history, social history, and family history reviewed and updated.  ROS: Denies orthopnea, PND, pedal edema, palpitations, lightheadedness or syncope.  Experiences excess nasal discharge and drainage with chest congestion.  Although he has used antihistamines in the past, he attributes the onset of this condition to his hospitalization for congestive heart failure.  PHYSICAL EXAM: BP 109/69  Pulse 72  Resp 16  Ht 5\' 9"  (1.753 m)  Wt 55.792 kg (123 lb)  BMI 18.16 kg/m2 ; patient reports weight has always been below normal General-Well developed; no acute distress Body habitus-Thin Neck-No JVD; no carotid bruits Lungs-clear lung fields; resonant to percussion; scoliosis of the thoracic spine convex leftward with kyphosis of the lumbar  spine Cardiovascular-normal PMI; increased intensity of S1 and normal S2 with physiologic splitting. Abdomen-normal bowel sounds; soft and non-tender without masses or organomegaly Musculoskeletal-No deformities, no cyanosis or clubbing Neurologic-Normal cranial nerves; symmetric strength and tone Skin-Warm, no significant lesions Extremities-distal pulses intact; no edema  ASSESSMENT AND PLAN:  [place] Bing, MD 06/20/2011 10:09 AM

## 2011-06-20 NOTE — Assessment & Plan Note (Signed)
CHF is well compensated.  Treatment with an aldosterone antagonist would be reasonable, but will be deferred in light of patient's asymptomatic status.

## 2011-07-05 ENCOUNTER — Other Ambulatory Visit: Payer: Self-pay | Admitting: Cardiology

## 2011-07-25 ENCOUNTER — Ambulatory Visit (INDEPENDENT_AMBULATORY_CARE_PROVIDER_SITE_OTHER): Payer: Medicaid Other | Admitting: Family Medicine

## 2011-07-25 ENCOUNTER — Encounter: Payer: Self-pay | Admitting: Family Medicine

## 2011-07-25 VITALS — BP 122/72 | HR 78 | Resp 18 | Ht 67.0 in | Wt 129.0 lb

## 2011-07-25 DIAGNOSIS — F17200 Nicotine dependence, unspecified, uncomplicated: Secondary | ICD-10-CM

## 2011-07-25 DIAGNOSIS — F329 Major depressive disorder, single episode, unspecified: Secondary | ICD-10-CM

## 2011-07-25 DIAGNOSIS — R7301 Impaired fasting glucose: Secondary | ICD-10-CM

## 2011-07-25 DIAGNOSIS — I420 Dilated cardiomyopathy: Secondary | ICD-10-CM

## 2011-07-25 DIAGNOSIS — Z7289 Other problems related to lifestyle: Secondary | ICD-10-CM

## 2011-07-25 DIAGNOSIS — Z789 Other specified health status: Secondary | ICD-10-CM

## 2011-07-25 DIAGNOSIS — F32A Depression, unspecified: Secondary | ICD-10-CM | POA: Insufficient documentation

## 2011-07-25 DIAGNOSIS — Z720 Tobacco use: Secondary | ICD-10-CM

## 2011-07-25 DIAGNOSIS — I428 Other cardiomyopathies: Secondary | ICD-10-CM

## 2011-07-25 DIAGNOSIS — R5383 Other fatigue: Secondary | ICD-10-CM | POA: Insufficient documentation

## 2011-07-25 DIAGNOSIS — R5381 Other malaise: Secondary | ICD-10-CM

## 2011-07-25 DIAGNOSIS — F3289 Other specified depressive episodes: Secondary | ICD-10-CM

## 2011-07-25 MED ORDER — TRAMADOL HCL 50 MG PO TABS: 50.0000 mg | ORAL_TABLET | Freq: Four times a day (QID) | ORAL | Status: DC | PRN

## 2011-07-25 NOTE — Assessment & Plan Note (Signed)
Will add A1C to next blood draw as well.

## 2011-07-25 NOTE — Assessment & Plan Note (Signed)
I reviewed this recent cardiology note as well as his EP physician - Dr. Ladona Ridgel

## 2011-07-25 NOTE — Patient Instructions (Addendum)
F/U in 2 months Get your testosterone drawn with the labs from Dr. Dietrich Pates Continue your current medications Use the pain medication as prescribed for your shoulder Drop off any forms that I need to complete

## 2011-07-25 NOTE — Progress Notes (Signed)
  Subjective:    Patient ID: [name], male    DOB: [birth year], 41 y.o.   MRN: [id number]  HPI Patient here to establish care. He has not had a primary care provider since childhood. He was recently diagnosed with congestive heart failure and cardiomyopathy in May 2012,. He is followed by [facility] cardiology Dr. Dietrich Pates  Fatigue- he feels very fatigued and tired most days. This has been worse since his heart failure. He thought he had low testosterone as his father had this. He has no energy many days. Does not always sleep very well. He denies ingestion of large amount of alcohol.  Depression- he feels he is depressed. 2 years ago his wife left him after they have been married for 10 years without any reason. He is now raising his 42-year-old son alone. He does have his sister and mother here to help him some. He has not gotten over her leaving. At this point he is unemployed and his unemployment check has run out. He has not received any child support from his previous wife. After his wife left he was started on a medication however states that he does not want to take any antidepressants in stopped at that time. Between his heart fell year and his wife leaving this is really change his life. He previously worked full time and had very good benefit and was able to care for his family. He denies suicidal ideation  Weakness- he had weakness in his left upper arm since his ICD was placed in December. He denies any paresthesias in his hands or fingertips.  Back pain- chronic back pain from his previous job. It is mostly in the lumbar region. He does have a history of scoliosis which she's had since childhood.  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 Patient had radiation and chemotherapy    Review of Systems   GEN- +fatigue, fever, weight loss,weakness, recent illness HEENT- denies eye drainage, change in vision, nasal discharge, CVS- denies chest pain, palpitations, leg swelling RESP- occSOB,  cough, wheeze ABD- denies N/V, change in stools, abd pain GU- denies dysuria, hematuria, dribbling, incontinence MSK- +joint pain, muscle aches, injury Neuro- denies headache, dizziness, syncope, seizure activity      Objective:   Physical Exam GEN- NAD, alert and oriented x3 HEENT- PERRL, EOMI, non injected sclera, pink conjunctiva, MMM, oropharynx clear Neck- Supple, normal ROM, pain with lateral movements Chest wall- Left chest wall AICD CVS- RRR, no murmur RESP-CTAB EXT- No edema Pulses- Radial, DP- 2+ MSK- Rotator cuff in tact, decreased strength LUE compared to right Back- thoracic kyphosis, scoliosis, mild TTP lumbar region      Assessment & Plan:

## 2011-07-25 NOTE — Assessment & Plan Note (Signed)
Pt admits to generalized fatigue, I think this is multifactorial between is cardiomyopathy, low EF, stress and underlying depression. He has family history of low testosterone conditions. Will obtain total testosterone level with his next blood draw.

## 2011-07-25 NOTE — Assessment & Plan Note (Signed)
Patient states he has cut back on his alcohol intake.

## 2011-07-26 NOTE — Assessment & Plan Note (Signed)
Patient is an unfortunate situation. At this time he is weary of medications however we will continue to revisit this and discuss if he would like to speak with a counselor. He poses no harm to himself.

## 2011-07-26 NOTE — Assessment & Plan Note (Signed)
Advised on smoking cessation. 

## 2011-08-01 ENCOUNTER — Other Ambulatory Visit: Payer: Self-pay | Admitting: *Deleted

## 2011-08-01 DIAGNOSIS — R002 Palpitations: Secondary | ICD-10-CM

## 2011-08-01 DIAGNOSIS — I10 Essential (primary) hypertension: Secondary | ICD-10-CM

## 2011-08-07 ENCOUNTER — Ambulatory Visit (INDEPENDENT_AMBULATORY_CARE_PROVIDER_SITE_OTHER): Payer: Medicaid Other | Admitting: Adult Health

## 2011-08-07 ENCOUNTER — Encounter: Payer: Self-pay | Admitting: Adult Health

## 2011-08-07 DIAGNOSIS — Z95810 Presence of automatic (implantable) cardiac defibrillator: Secondary | ICD-10-CM

## 2011-08-07 DIAGNOSIS — I420 Dilated cardiomyopathy: Secondary | ICD-10-CM

## 2011-08-07 DIAGNOSIS — I428 Other cardiomyopathies: Secondary | ICD-10-CM

## 2011-08-07 NOTE — Patient Instructions (Signed)
**Note De-identified  Obfuscation** Your physician recommends that you return for lab work in: today  Your physician has requested that you have an echocardiogram. Echocardiography is a painless test that uses sound waves to create images of your heart. It provides your doctor with information about the size and shape of your heart and how well your heart's chambers and valves are working. This procedure takes approximately one hour. There are no restrictions for this procedure.  Your physician recommends that you schedule a follow-up appointment in: 6 months    
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
## 2011-08-07 NOTE — Assessment & Plan Note (Signed)
I have advised him to take the medications as directed. I will draw BMET to evaluate of hypokalemia on lasix without potassium replacement. Echo will be completed to evaluate for improvement in LV fx. He is to keep his appointment with Dr.Taylor in 2 weeks. Do not feel this is cardiac chest pain, more musculoskeletal in etiology. Possibly arthritis pain.

## 2011-08-08 LAB — BASIC METABOLIC PANEL
CO2: 27 mEq/L (ref 19–32)
Chloride: 97 mEq/L (ref 96–112)
Potassium: 5.4 mEq/L — ABNORMAL HIGH (ref 3.5–5.3)
Sodium: 135 mEq/L (ref 135–145)

## 2011-08-13 ENCOUNTER — Ambulatory Visit (HOSPITAL_COMMUNITY)
Admission: RE | Admit: 2011-08-13 | Discharge: 2011-08-13 | Disposition: A | Discharge status: Home / Self Care | Payer: Medicaid Other | Source: Ambulatory Visit | Attending: Adult Health | Admitting: Adult Health

## 2011-08-13 DIAGNOSIS — I428 Other cardiomyopathies: Secondary | ICD-10-CM | POA: Insufficient documentation

## 2011-08-13 DIAGNOSIS — I369 Nonrheumatic tricuspid valve disorder, unspecified: Secondary | ICD-10-CM

## 2011-08-13 DIAGNOSIS — R079 Chest pain, unspecified: Secondary | ICD-10-CM | POA: Insufficient documentation

## 2011-08-13 NOTE — Progress Notes (Signed)
*  PRELIMINARY RESULTS* Echocardiogram 2D Echocardiogram has been performed.  [name] 08/13/2011, 10:33 AM

## 2011-08-17 ENCOUNTER — Other Ambulatory Visit: Payer: Self-pay | Admitting: *Deleted

## 2011-08-17 DIAGNOSIS — R002 Palpitations: Secondary | ICD-10-CM

## 2011-08-17 DIAGNOSIS — I10 Essential (primary) hypertension: Secondary | ICD-10-CM

## 2011-08-20 ENCOUNTER — Other Ambulatory Visit: Payer: Self-pay | Admitting: Cardiology

## 2011-08-21 ENCOUNTER — Other Ambulatory Visit: Payer: Self-pay | Admitting: *Deleted

## 2011-08-21 DIAGNOSIS — E875 Hyperkalemia: Secondary | ICD-10-CM

## 2011-08-21 LAB — BASIC METABOLIC PANEL
BUN: 6 mg/dL (ref 6–23)
CO2: 27 mEq/L (ref 19–32)
Chloride: 97 mEq/L (ref 96–112)
Glucose, Bld: 90 mg/dL (ref 70–99)
Potassium: 6 mEq/L — ABNORMAL HIGH (ref 3.5–5.3)

## 2011-08-21 MED ORDER — SODIUM POLYSTYRENE SULFONATE 15 GM/60ML PO SUSP: 15.0000 g | Freq: Once | ORAL | Status: AC

## 2011-08-23 ENCOUNTER — Other Ambulatory Visit: Payer: Self-pay | Admitting: *Deleted

## 2011-08-23 DIAGNOSIS — E875 Hyperkalemia: Secondary | ICD-10-CM

## 2011-08-24 ENCOUNTER — Encounter: Payer: Medicaid Other | Admitting: *Deleted

## 2011-08-25 LAB — BASIC METABOLIC PANEL
CO2: 26 mEq/L (ref 19–32)
Glucose, Bld: 144 mg/dL — ABNORMAL HIGH (ref 70–99)
Potassium: 4.8 mEq/L (ref 3.5–5.3)
Sodium: 137 mEq/L (ref 135–145)

## 2011-08-27 ENCOUNTER — Encounter: Payer: Self-pay | Admitting: *Deleted

## 2011-08-27 ENCOUNTER — Other Ambulatory Visit: Payer: Self-pay | Admitting: *Deleted

## 2011-08-27 DIAGNOSIS — I10 Essential (primary) hypertension: Secondary | ICD-10-CM

## 2011-08-28 ENCOUNTER — Encounter: Payer: Self-pay | Admitting: Cardiology

## 2011-08-28 ENCOUNTER — Encounter: Payer: Medicaid Other | Admitting: Cardiology

## 2011-08-30 ENCOUNTER — Encounter: Payer: Self-pay | Admitting: Internal Medicine

## 2011-08-30 ENCOUNTER — Ambulatory Visit (INDEPENDENT_AMBULATORY_CARE_PROVIDER_SITE_OTHER): Payer: Medicaid Other | Admitting: Internal Medicine

## 2011-08-30 VITALS — BP 112/79 | HR 90 | Ht 69.0 in | Wt 123.0 lb

## 2011-08-30 DIAGNOSIS — I509 Heart failure, unspecified: Secondary | ICD-10-CM

## 2011-08-30 DIAGNOSIS — I420 Dilated cardiomyopathy: Secondary | ICD-10-CM

## 2011-08-30 DIAGNOSIS — Z95810 Presence of automatic (implantable) cardiac defibrillator: Secondary | ICD-10-CM

## 2011-08-30 DIAGNOSIS — I428 Other cardiomyopathies: Secondary | ICD-10-CM

## 2011-08-30 DIAGNOSIS — I5022 Chronic systolic (congestive) heart failure: Secondary | ICD-10-CM | POA: Insufficient documentation

## 2011-08-30 LAB — ICD DEVICE OBSERVATION
BRDY-0002RV: 40 {beats}/min
DEV-0020ICD: NEGATIVE
HV IMPEDENCE: 93 Ohm
TZON-0003FASTVT: 315.7 ms
VENTRICULAR PACING ICD: 1 pct

## 2011-08-30 NOTE — Assessment & Plan Note (Signed)
His device is working normally. Will plan to recheck in several months. 

## 2011-08-30 NOTE — Assessment & Plan Note (Signed)
His symptoms remain class 2. He will continue his current meds. 

## 2011-08-30 NOTE — Progress Notes (Signed)
HPI [name] returns today for followup. He is a pleasant 41 yo man with a non-ischemic CM, chronic systolic CHF, s/p ICD implant for primary prevention. He denies chest pain, sob, or peripheral edema. No ICD shocks. He has been bothered by hyperkalemia.  Allergies  Allergen Reactions  . Ibuprofen Hives     Current Outpatient Prescriptions  Medication Sig Dispense Refill  . aspirin 81 MG tablet Take 81 mg by mouth daily.        . carvedilol (COREG) 12.5 MG tablet TAKE ONE TABLET BY MOUTH TWICE DAILY WITH A MEAL  60 tablet  1  . furosemide (LASIX) 20 MG tablet TAKE ONE TABLET BY MOUTH DAILY  30 tablet  1  . lisinopril (PRINIVIL,ZESTRIL) 10 MG tablet Take 5 mg by mouth daily.      . Multiple Vitamins-Minerals (MULTIVITAMIN WITH MINERALS) tablet Take 1 tablet by mouth daily.        . traMADol (ULTRAM) 50 MG tablet Take 1 tablet (50 mg total) by mouth every 6 (six) hours as needed for pain.  40 tablet  1     Past Medical History  Diagnosis Date  . Cardiomyopathy, nonischemic 10/2010    Presented with congestive heart failure and EF of 15%; improves to 25% after initiation of medical therapy;  . Tobacco abuse     30 pack years  . Orthostatic hypotension   . Wilm's tumor     nephrectomy-age 59  . Alcohol ingestion of one to four drinks per day     Equivalent of 4-5 ounces of liquor in beer  . Scoliosis   . Fasting hyperglycemia 06/20/2011    ROS:   All systems reviewed and negative except as noted in the HPI.   Past Surgical History  Procedure Date  . Nephrectomy     Wilms Tumor  . Icd 05/25/2011    [employer] lead/[employer] single chamber device     No family history on file.   History   Social History  . Marital Status: Legally Separated    Spouse Name: N/A    Number of Children: 1  . Years of Education: N/A   Occupational History  . full time     Engineer, petroleum   Social History Main Topics  . Smoking status: Current Everyday  Smoker -- 0.8 packs/day  . Smokeless tobacco: Never Used  . Alcohol Use: Yes     OCCASIONAL  . Drug Use: No  . Sexually Active: Not Currently   Other Topics Concern  . Not on file   Social History Narrative  . No narrative on file     BP 112/79  Pulse 90  Ht 5\' 9"  (1.753 m)  Wt 55.792 kg (123 lb)  BMI 18.16 kg/m2  Physical Exam:  Well appearing young man, NAD HEENT: Unremarkable Neck:  No JVD, no thyromegally Lungs:  Clear with no wheezes. HEART:  Regular rate rhythm, no murmurs, no rubs, no clicks Abd:  soft, positive bowel sounds, no organomegally, no rebound, no guarding Ext:  2 plus pulses, no edema, no cyanosis, no clubbing Skin:  No rashes no nodules Neuro:  CN II through XII intact, motor grossly intact  DEVICE  Normal device function.  See PaceArt for details.   Assess/Plan:

## 2011-08-30 NOTE — Patient Instructions (Signed)
**Note De-identified  Obfuscation** Your physician recommends that you schedule a follow-up appointment in: 1 year  

## 2011-09-03 NOTE — Progress Notes (Signed)
This encounter was created in error - please disregard.

## 2011-09-18 ENCOUNTER — Encounter: Payer: Self-pay | Admitting: Family Medicine

## 2011-09-18 ENCOUNTER — Ambulatory Visit (INDEPENDENT_AMBULATORY_CARE_PROVIDER_SITE_OTHER): Payer: Medicaid Other | Admitting: Family Medicine

## 2011-09-18 VITALS — BP 150/84 | HR 84 | Resp 18 | Ht 67.0 in | Wt 122.0 lb

## 2011-09-18 DIAGNOSIS — F329 Major depressive disorder, single episode, unspecified: Secondary | ICD-10-CM

## 2011-09-18 DIAGNOSIS — R5381 Other malaise: Secondary | ICD-10-CM

## 2011-09-18 DIAGNOSIS — I420 Dilated cardiomyopathy: Secondary | ICD-10-CM

## 2011-09-18 DIAGNOSIS — R5383 Other fatigue: Secondary | ICD-10-CM

## 2011-09-18 DIAGNOSIS — I5022 Chronic systolic (congestive) heart failure: Secondary | ICD-10-CM

## 2011-09-18 DIAGNOSIS — I428 Other cardiomyopathies: Secondary | ICD-10-CM

## 2011-09-18 DIAGNOSIS — F3289 Other specified depressive episodes: Secondary | ICD-10-CM

## 2011-09-18 DIAGNOSIS — Z720 Tobacco use: Secondary | ICD-10-CM

## 2011-09-18 DIAGNOSIS — I509 Heart failure, unspecified: Secondary | ICD-10-CM

## 2011-09-18 DIAGNOSIS — F17200 Nicotine dependence, unspecified, uncomplicated: Secondary | ICD-10-CM

## 2011-09-18 DIAGNOSIS — F32A Depression, unspecified: Secondary | ICD-10-CM

## 2011-09-18 NOTE — Patient Instructions (Addendum)
Change your Coreg to 1/2 tablet twice a day Take the lasix daily Take the aspirin daily  Increase your Ultram to 2 tablets as needed Do not take the lisinopril right now F/U 3 weeks

## 2011-09-19 ENCOUNTER — Encounter: Payer: Self-pay | Admitting: Family Medicine

## 2011-09-19 ENCOUNTER — Other Ambulatory Visit: Payer: Self-pay | Admitting: *Deleted

## 2011-09-19 DIAGNOSIS — I10 Essential (primary) hypertension: Secondary | ICD-10-CM

## 2011-09-19 NOTE — Assessment & Plan Note (Signed)
He still does not feel he needs any help with his depression, he is concerned today he may loose his home as it will take another few months for disability.

## 2011-09-19 NOTE — Assessment & Plan Note (Signed)
Per previous visit, multifactorial but I think his mood is truly the culprit he also continues to drink but denies any heavy drinking.

## 2011-09-19 NOTE — Assessment & Plan Note (Signed)
Pt contemplating cessation

## 2011-09-19 NOTE — Assessment & Plan Note (Signed)
We discussed the importance of taking his heart medications, Will start him back on lower dose of BB and recheck, hold ACEI at this time. Restart Lasix

## 2011-09-19 NOTE — Assessment & Plan Note (Signed)
Currently compensated despite not taking meds

## 2011-09-19 NOTE — Progress Notes (Signed)
  Subjective:    Patient ID: [name], male    DOB: 1970/07/19, 41 y.o.   MRN: [id number]  HPI  Cardiomyopathy and CHF- he has stopped all of his medications thinking they were causing fatigue and muscle aches, he was also concerned about his potassium even though his lisinopril was decreased. Note he went off his meds after our initial visit then restarted then stopped again 1 week ago. He doesn't see where the meds are helping. Last cards note reviewed  Chronic back pain- ultram low dose not helping much   CHF- dry weight maintained at 123lbs   Review of Systems GEN- +fatigue, fever, weight loss,weakness, recent illness HEENT- denies eye drainage, change in vision, nasal discharge, CVS- denies chest pain, palpitations, leg swelling RESP- occSOB, cough, wheeze ABD- denies N/V, change in stools, abd pain GU- denies dysuria, hematuria, dribbling, incontinence MSK- +joint pain, muscle aches, injury Neuro- denies headache, dizziness, syncope, seizure activity    Objective:   Physical Exam GEN- NAD, alert and oriented x3 Chest wall- Left chest wall AICD CVS- RRR, no murmur RESP-CTAB EXT- No edema Pulses- Radial, DP- 2+ Psych- depressed affect, not overly anxious Back- thoracic kyphosis, scoliosis, mild TTP lumbar region       Assessment & Plan:

## 2011-09-28 ENCOUNTER — Encounter: Payer: Self-pay | Admitting: *Deleted

## 2011-10-02 LAB — BASIC METABOLIC PANEL
Chloride: 99 mEq/L (ref 96–112)
Creat: 0.83 mg/dL (ref 0.50–1.35)

## 2011-10-02 LAB — HEMOGLOBIN A1C: Mean Plasma Glucose: 126 mg/dL — ABNORMAL HIGH (ref <117)

## 2011-10-03 ENCOUNTER — Other Ambulatory Visit: Payer: Self-pay | Admitting: Cardiology

## 2011-10-05 ENCOUNTER — Encounter: Payer: Self-pay | Admitting: *Deleted

## 2011-10-09 ENCOUNTER — Ambulatory Visit: Payer: Medicaid Other

## 2011-10-11 ENCOUNTER — Ambulatory Visit: Payer: Medicaid Other | Admitting: Family Medicine

## 2011-10-22 ENCOUNTER — Ambulatory Visit (INDEPENDENT_AMBULATORY_CARE_PROVIDER_SITE_OTHER): Payer: Medicaid Other | Admitting: Family Medicine

## 2011-10-22 ENCOUNTER — Encounter: Payer: Self-pay | Admitting: Family Medicine

## 2011-10-22 VITALS — BP 122/80 | HR 65 | Resp 15 | Ht 67.0 in | Wt 121.8 lb

## 2011-10-22 DIAGNOSIS — M549 Dorsalgia, unspecified: Secondary | ICD-10-CM

## 2011-10-22 DIAGNOSIS — M542 Cervicalgia: Secondary | ICD-10-CM | POA: Insufficient documentation

## 2011-10-22 DIAGNOSIS — I420 Dilated cardiomyopathy: Secondary | ICD-10-CM

## 2011-10-22 DIAGNOSIS — R5381 Other malaise: Secondary | ICD-10-CM

## 2011-10-22 DIAGNOSIS — E785 Hyperlipidemia, unspecified: Secondary | ICD-10-CM

## 2011-10-22 DIAGNOSIS — I509 Heart failure, unspecified: Secondary | ICD-10-CM

## 2011-10-22 DIAGNOSIS — R5383 Other fatigue: Secondary | ICD-10-CM

## 2011-10-22 DIAGNOSIS — I428 Other cardiomyopathies: Secondary | ICD-10-CM

## 2011-10-22 DIAGNOSIS — R7301 Impaired fasting glucose: Secondary | ICD-10-CM

## 2011-10-22 DIAGNOSIS — I5022 Chronic systolic (congestive) heart failure: Secondary | ICD-10-CM

## 2011-10-22 MED ORDER — HYDROCODONE-ACETAMINOPHEN 5-500 MG PO TABS: 1.0000 | ORAL_TABLET | Freq: Two times a day (BID) | ORAL | Status: DC | PRN

## 2011-10-22 NOTE — Progress Notes (Signed)
  Subjective:    Patient ID: Lovina Reach, male    DOB: May 16, 1971, 41 y.o.   MRN: [id number]  HPI  Previous visit pt seen for fatigue, he has stopped all of his cardiac medications. Restarted on Beta blocker at 6.25mg  BID and lasix, he is doing well on both, has not had any cramping or fatigue.He is not taking lisinopril as previously directed. Continues to have back and neck pain, history of scoliosis, ultram is not helping. Occasional right radicular symptoms,no change in bowel or bladder   Review of Systems   GEN- denies fatigue, fever, weight loss,weakness, recent illness HEENT- denies eye drainage, change in vision, nasal discharge, CVS- denies chest pain, palpitations RESP- denies SOB, cough, wheeze ABD- denies N/V, change in stools, abd pain GU- denies dysuria, hematuria, dribbling, incontinence MSK- + joint pain, muscle aches, injury Neuro- denies headache, dizziness, syncope, seizure activity       Objective:   Physical Exam GEN- NAD, alert and oriented x3 HEENT- PERRL, EOMI, non injected sclera, pink conjunctiva, MMM, oropharynx clear,non icteric Neck- Supple, decreased ROM, TTP lower  c spine CVS- RRR, no murmur RESP-CTAB Back- scoliosis noted, lumbar spine NT, neg SLR EXT- No edema Pulses- Radial, DP- 2+        Assessment & Plan:

## 2011-10-22 NOTE — Assessment & Plan Note (Signed)
Obtain imaging, give hydrocodone, placed on pain contract discuss use with alcohol

## 2011-10-22 NOTE — Assessment & Plan Note (Signed)
Check imaging, I think his pain is more from his scoliosis

## 2011-10-22 NOTE — Assessment & Plan Note (Signed)
Currently compensated 

## 2011-10-22 NOTE — Patient Instructions (Signed)
Continue the coreg at 1/2 tablet twice a day  Get the x-rays of your back Get the labs fasting do not eat after midnight  We will call with results  You can take a full dose aspirin  Continue lasix  F/U 4 months

## 2011-10-22 NOTE — Assessment & Plan Note (Signed)
Improved, did not tolerate ACEI, continue BB for CHF

## 2011-10-22 NOTE — Assessment & Plan Note (Signed)
He did not tolerate ACEI, symptoms stable with use of low dose BB and diuretic

## 2011-10-25 ENCOUNTER — Ambulatory Visit (HOSPITAL_COMMUNITY)
Admission: RE | Admit: 2011-10-25 | Discharge: 2011-10-25 | Disposition: A | Discharge status: Home / Self Care | Payer: Medicaid Other | Source: Ambulatory Visit | Attending: Family Medicine | Admitting: Family Medicine

## 2011-10-25 DIAGNOSIS — M545 Low back pain, unspecified: Secondary | ICD-10-CM | POA: Insufficient documentation

## 2011-10-25 DIAGNOSIS — Z95810 Presence of automatic (implantable) cardiac defibrillator: Secondary | ICD-10-CM | POA: Insufficient documentation

## 2011-10-25 DIAGNOSIS — M4120 Other idiopathic scoliosis, site unspecified: Secondary | ICD-10-CM | POA: Insufficient documentation

## 2011-10-25 DIAGNOSIS — M546 Pain in thoracic spine: Secondary | ICD-10-CM | POA: Insufficient documentation

## 2011-10-25 DIAGNOSIS — M549 Dorsalgia, unspecified: Secondary | ICD-10-CM

## 2011-10-25 DIAGNOSIS — M542 Cervicalgia: Secondary | ICD-10-CM | POA: Insufficient documentation

## 2011-10-25 DIAGNOSIS — M8448XA Pathological fracture, other site, initial encounter for fracture: Secondary | ICD-10-CM | POA: Insufficient documentation

## 2011-10-25 DIAGNOSIS — Q765 Cervical rib: Secondary | ICD-10-CM | POA: Insufficient documentation

## 2011-10-25 LAB — LIPID PANEL
HDL: 96 mg/dL (ref >39)
LDL Cholesterol: 153 mg/dL — ABNORMAL HIGH (ref 0–99)
VLDL: 17 mg/dL (ref 0–40)

## 2011-10-25 LAB — COMPREHENSIVE METABOLIC PANEL
AST: 51 U/L — ABNORMAL HIGH (ref 0–37)
Albumin: 4.6 g/dL (ref 3.5–5.2)
Alkaline Phosphatase: 76 U/L (ref 39–117)
Potassium: 4.2 mEq/L (ref 3.5–5.3)
Sodium: 136 mEq/L (ref 135–145)
Total Bilirubin: 0.5 mg/dL (ref 0.3–1.2)
Total Protein: 7.3 g/dL (ref 6.0–8.3)

## 2011-10-25 LAB — HEMOGLOBIN A1C: Hgb A1c MFr Bld: 6 % — ABNORMAL HIGH (ref <5.7)

## 2011-10-26 LAB — CBC
HCT: 45.7 % (ref 39.0–52.0)
Hemoglobin: 15 g/dL (ref 13.0–17.0)
MCHC: 32.8 g/dL (ref 30.0–36.0)
RDW: 14.8 % (ref 11.5–15.5)
WBC: 4.3 10*3/uL (ref 4.0–10.5)

## 2011-10-31 ENCOUNTER — Telehealth: Payer: Self-pay | Admitting: Family Medicine

## 2011-10-31 DIAGNOSIS — M898X9 Other specified disorders of bone, unspecified site: Secondary | ICD-10-CM

## 2011-10-31 DIAGNOSIS — IMO0002 Reserved for concepts with insufficient information to code with codable children: Secondary | ICD-10-CM

## 2011-10-31 DIAGNOSIS — E889 Metabolic disorder, unspecified: Secondary | ICD-10-CM

## 2011-10-31 MED ORDER — SIMVASTATIN 20 MG PO TABS: 20.0000 mg | ORAL_TABLET | Freq: Every evening | ORAL | Status: DC

## 2011-10-31 NOTE — Telephone Encounter (Signed)
Patient is aware 
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
## 2011-11-08 ENCOUNTER — Ambulatory Visit (HOSPITAL_COMMUNITY)
Admission: RE | Admit: 2011-11-08 | Discharge: 2011-11-08 | Disposition: A | Discharge status: Home / Self Care | Payer: Medicaid Other | Source: Ambulatory Visit | Attending: Family Medicine | Admitting: Family Medicine

## 2011-11-08 DIAGNOSIS — F17200 Nicotine dependence, unspecified, uncomplicated: Secondary | ICD-10-CM | POA: Insufficient documentation

## 2011-11-08 DIAGNOSIS — X58XXXA Exposure to other specified factors, initial encounter: Secondary | ICD-10-CM | POA: Insufficient documentation

## 2011-11-08 DIAGNOSIS — M898X9 Other specified disorders of bone, unspecified site: Secondary | ICD-10-CM

## 2011-11-08 DIAGNOSIS — T148XXA Other injury of unspecified body region, initial encounter: Secondary | ICD-10-CM | POA: Insufficient documentation

## 2011-11-08 DIAGNOSIS — E889 Metabolic disorder, unspecified: Secondary | ICD-10-CM

## 2011-11-08 DIAGNOSIS — IMO0002 Reserved for concepts with insufficient information to code with codable children: Secondary | ICD-10-CM

## 2011-11-08 DIAGNOSIS — M899 Disorder of bone, unspecified: Secondary | ICD-10-CM | POA: Insufficient documentation

## 2011-11-12 ENCOUNTER — Other Ambulatory Visit: Payer: Self-pay | Admitting: Family Medicine

## 2011-11-12 DIAGNOSIS — M810 Age-related osteoporosis without current pathological fracture: Secondary | ICD-10-CM

## 2011-11-14 ENCOUNTER — Other Ambulatory Visit: Payer: Self-pay | Admitting: Family Medicine

## 2011-11-15 LAB — TESTOSTERONE, FREE, TOTAL, SHBG
Sex Hormone Binding: 68 nmol/L (ref 13–71)
Testosterone-% Free: 1.4 % — ABNORMAL LOW (ref 1.6–2.9)
Testosterone: 851.91 ng/dL (ref 300–890)

## 2011-11-19 ENCOUNTER — Encounter: Payer: Self-pay | Admitting: Family Medicine

## 2011-11-28 ENCOUNTER — Encounter: Payer: Self-pay | Admitting: Internal Medicine

## 2011-11-28 ENCOUNTER — Ambulatory Visit (INDEPENDENT_AMBULATORY_CARE_PROVIDER_SITE_OTHER): Payer: Medicaid Other | Admitting: *Deleted

## 2011-11-28 DIAGNOSIS — I420 Dilated cardiomyopathy: Secondary | ICD-10-CM

## 2011-11-28 DIAGNOSIS — I428 Other cardiomyopathies: Secondary | ICD-10-CM

## 2011-11-28 LAB — ICD DEVICE OBSERVATION
BRDY-0002RV: 40 {beats}/min
CHARGE TIME: 9.2 s
DEV-0020ICD: NEGATIVE
HV IMPEDENCE: 97 Ohm
TZON-0003FASTVT: 315.7 ms

## 2011-11-28 NOTE — Progress Notes (Signed)
defib check in clinic  

## 2011-12-24 ENCOUNTER — Other Ambulatory Visit: Payer: Self-pay

## 2011-12-24 MED ORDER — HYDROCODONE-ACETAMINOPHEN 5-500 MG PO TABS: 1.0000 | ORAL_TABLET | Freq: Two times a day (BID) | ORAL | Status: DC | PRN

## 2012-01-01 ENCOUNTER — Ambulatory Visit (INDEPENDENT_AMBULATORY_CARE_PROVIDER_SITE_OTHER): Payer: Medicaid Other | Admitting: Family Medicine

## 2012-01-01 ENCOUNTER — Encounter: Payer: Self-pay | Admitting: Family Medicine

## 2012-01-01 VITALS — BP 128/82 | HR 75 | Resp 16 | Ht 67.0 in | Wt 118.8 lb

## 2012-01-01 DIAGNOSIS — M419 Scoliosis, unspecified: Secondary | ICD-10-CM

## 2012-01-01 DIAGNOSIS — M549 Dorsalgia, unspecified: Secondary | ICD-10-CM

## 2012-01-01 DIAGNOSIS — Z8781 Personal history of (healed) traumatic fracture: Secondary | ICD-10-CM

## 2012-01-01 DIAGNOSIS — M4120 Other idiopathic scoliosis, site unspecified: Secondary | ICD-10-CM

## 2012-01-01 NOTE — Patient Instructions (Signed)
You will be set up for MRI of back  I will refer you to a surgeon for your back pain  Continue current medications  Keep previous f/u appointment

## 2012-01-01 NOTE — Assessment & Plan Note (Signed)
He now has new radicular symptoms will obtain CT lumbar spine, will refer to neurosurgery for his radicular symptoms and compression fractures, scoliosis
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
## 2012-01-01 NOTE — Assessment & Plan Note (Signed)
Thoracic scoliosis

## 2012-01-01 NOTE — Progress Notes (Signed)
  Subjective:    Patient ID: [name], male    DOB: [birth year], 41 y.o.   MRN: [id number]  HPI  [name] presents with worsening lumbar back pain. He states in the past 2 weeks she's had sharp pain radiating from his lower back down his right leg which is new. The pain causes his knee to buckle when it occurs He denies any tingling or numbness in the feet he denies any change in bowel or bladder. He's been using hydrocodone twice a day. He has difficulty bending over he is very stiff in the mornings. He is unable to lift anything heavy without pain in his lower back. We reviewed his bone density and bone scan which revealed low bone mass a very young age he also has multiple compression fractures which are all his x-rays were also concerning for  Scoliosis Regarding the compression fractions he has not had any recent injuries however he has had radiation and chemotherapy as a child for Wilms tumor. He request a letter today stating his condition for his disability process  Review of Systems   GEN- denies fatigue, fever, weight loss,weakness, recent illness HEENT- denies eye drainage, change in vision, nasal discharge, CVS- denies chest pain, palpitations RESP- denies SOB, cough, wheeze ABD- denies N/V, change in stools, abd pain GU- denies dysuria, hematuria, dribbling, incontinence MSK- denies joint pain, muscle aches, injury Neuro- denies headache, dizziness, syncope, seizure activity      Objective:   Physical Exam GEN- NAD, alert and oriented x3 Neck- Supple, decreased ROM,  CVS- RRR, no murmur RESP-CTAB Back- scoliosis noted, lumbar spine TT,P, + SLR right side, limited flexion  HIP- normal HIP exam, EXT- No edema Pulses- Radial, DP- 2+ Neuro- DTR symmetric, sensation grossly in tact        Assessment & Plan:

## 2012-01-15 ENCOUNTER — Telehealth: Payer: Self-pay | Admitting: Family Medicine

## 2012-01-18 NOTE — Telephone Encounter (Signed)
Patient is aware 

## 2012-01-22 ENCOUNTER — Ambulatory Visit (HOSPITAL_COMMUNITY)
Admission: RE | Admit: 2012-01-22 | Discharge: 2012-01-22 | Disposition: A | Discharge status: Home / Self Care | Payer: Medicaid Other | Source: Ambulatory Visit | Attending: Family Medicine | Admitting: Family Medicine

## 2012-01-22 DIAGNOSIS — M545 Low back pain, unspecified: Secondary | ICD-10-CM | POA: Insufficient documentation

## 2012-01-22 DIAGNOSIS — M419 Scoliosis, unspecified: Secondary | ICD-10-CM

## 2012-01-22 DIAGNOSIS — Z8781 Personal history of (healed) traumatic fracture: Secondary | ICD-10-CM

## 2012-01-22 DIAGNOSIS — M549 Dorsalgia, unspecified: Secondary | ICD-10-CM

## 2012-01-22 DIAGNOSIS — M4120 Other idiopathic scoliosis, site unspecified: Secondary | ICD-10-CM | POA: Insufficient documentation

## 2012-01-28 ENCOUNTER — Other Ambulatory Visit: Payer: Self-pay | Admitting: Family Medicine

## 2012-02-04 ENCOUNTER — Ambulatory Visit (INDEPENDENT_AMBULATORY_CARE_PROVIDER_SITE_OTHER): Payer: Medicaid Other | Admitting: Adult Health

## 2012-02-04 ENCOUNTER — Encounter: Payer: Self-pay | Admitting: Adult Health

## 2012-02-04 VITALS — BP 130/86 | HR 88 | Ht 69.0 in | Wt 118.0 lb

## 2012-02-04 DIAGNOSIS — Z720 Tobacco use: Secondary | ICD-10-CM

## 2012-02-04 DIAGNOSIS — I5022 Chronic systolic (congestive) heart failure: Secondary | ICD-10-CM

## 2012-02-04 DIAGNOSIS — Z95810 Presence of automatic (implantable) cardiac defibrillator: Secondary | ICD-10-CM

## 2012-02-04 DIAGNOSIS — F17200 Nicotine dependence, unspecified, uncomplicated: Secondary | ICD-10-CM

## 2012-02-04 DIAGNOSIS — I509 Heart failure, unspecified: Secondary | ICD-10-CM

## 2012-02-04 NOTE — Patient Instructions (Addendum)
Your physician wants you to follow-up in: 6 months with Joni Reining, NP.  You will receive a reminder letter in the mail two months in advance. If you don't receive a letter, please call our office to schedule the follow-up appointment.  Keep scheduled appt with [facility].  OTC Benadryl for itching.

## 2012-02-04 NOTE — Assessment & Plan Note (Signed)
He will follow Dr. Ladona Ridgel, with planned appointment in 2 weeks for pacemaker interrogation.

## 2012-02-04 NOTE — Progress Notes (Signed)
HPI: [name] is a 41 year old patient of both Dr. Dietrich Pates and Dr. Sharrell Ku we are seeing for 6 month followup with known history of nonischemic cardiomyopathy, systolic CHF, status post ICD implant for primary prevention, with ongoing history of arthritis pain and tobacco abuse. He has a [employer] single-chamber defibrillator he has been without complaints his things that seen last with exception of chronic arthritis pain of the left shoulder. He is followed by primary care is provided pain control meds. He has a recent bite on his right elbow that causes has caused a mild rash on his arms with pruritis. He is due to followup with Dr. Ladona Ridgel for a pacemaker interrogation in 2 weeks. He states he is trying to quit smoking is under a lot of stress. He is aware of the need to stop altogether  Allergies  Allergen Reactions  . Ibuprofen Hives    Current Outpatient Prescriptions  Medication Sig Dispense Refill  . aspirin 81 MG tablet Take 81 mg by mouth daily.        . calcium-vitamin D (OSCAL WITH D) 500-200 MG-UNIT per tablet Take 1 tablet by mouth 2 (two) times daily.      . carvedilol (COREG) 12.5 MG tablet 6.25 mg 2 (two) times daily with a meal.       . furosemide (LASIX) 20 MG tablet TAKE ONE TABLET BY MOUTH DAILY  30 tablet  6  . HYDROcodone-acetaminophen (VICODIN) 5-500 MG per tablet TAKE ONE TABLET BY MOUTH TWICE DAILY AS NEEDED FOR PAIN  60 tablet  0  . Multiple Vitamins-Minerals (MULTIVITAMIN WITH MINERALS) tablet Take 1 tablet by mouth daily.        . simvastatin (ZOCOR) 20 MG tablet Take 1 tablet (20 mg total) by mouth every evening.  30 tablet  6    Past Medical History  Diagnosis Date  . Cardiomyopathy, nonischemic 10/2010    Presented with congestive heart failure and EF of 15%; improves to 25% after initiation of medical therapy;  . Tobacco abuse     30 pack years  . Orthostatic hypotension   . Alcohol ingestion of one to four drinks per day    Equivalent of 4-5 ounces of liquor in beer  . Scoliosis   . Fasting hyperglycemia 06/20/2011  . Wilm's tumor     nephrectomy-age 26/ Radiation and Chemo    Past Surgical History  Procedure Date  . Nephrectomy     Wilms Tumor  . Icd 05/25/2011    [employer] End[employer]ak [employer] SG lead/[employer] single chamber device    ZOX:[id number] of systems complete and found to be negative unless listed above  PHYSICAL EXAM BP 130/86  Pulse 88  Ht 5\' 9"  (1.753 m)  Wt 118 lb (53.524 kg)  BMI 17.43 kg/m2  General: Well developed, well nourished, in no acute distress Head: Eyes PERRLA, No xanthomas.   Normal cephalic and atramatic  Lungs: Clear bilaterally to auscultation and percussion. Heart: HRRR S1 S2, without MRG.  Pulses are 2+ & equal.            No carotid bruit. No JVD.  No abdominal bruits. No femoral bruits. Abdomen: Bowel sounds are positive, abdomen soft and non-tender without masses or                  Hernia's noted. Msk:  Back normal, normal gait. Normal strength and tone for age. Well healed scar on his left upper chest at site of ICD implant. Extremities:  No clubbing, cyanosis or edema.  DP +1.some redness but no whelps noted on the right arm. Small bite noted at the elbow. Neuro: Alert and oriented X 3. Psych:  Good affect, responds appropriately    ASSESSMENT AND PLAN

## 2012-02-04 NOTE — Assessment & Plan Note (Signed)
Is medically compliant weight has been stable, he is avoiding salty foods. We will make no changes on his medications at this time as he is doing so well without evidence of fluid retention. Continue carvedilol 12.5 mg twice a day and lisinopril 5 mg daily. We will see him again in 6 months unless becomes symptomatic. He is followed by PCP for labs and pain control.

## 2012-02-04 NOTE — Assessment & Plan Note (Signed)
He is aware of the need to stop smoking. He states he is trying to cut down. I have encouraged him to do so

## 2012-02-22 ENCOUNTER — Ambulatory Visit: Payer: Medicaid Other | Admitting: Family Medicine

## 2012-02-22 ENCOUNTER — Encounter: Payer: Self-pay | Admitting: *Deleted

## 2012-02-29 ENCOUNTER — Ambulatory Visit (INDEPENDENT_AMBULATORY_CARE_PROVIDER_SITE_OTHER): Payer: Medicaid Other | Admitting: Family Medicine

## 2012-02-29 ENCOUNTER — Encounter: Payer: Self-pay | Admitting: Family Medicine

## 2012-02-29 VITALS — BP 130/78 | HR 82 | Resp 18 | Ht 67.0 in | Wt 121.1 lb

## 2012-02-29 DIAGNOSIS — F32A Depression, unspecified: Secondary | ICD-10-CM

## 2012-02-29 DIAGNOSIS — M549 Dorsalgia, unspecified: Secondary | ICD-10-CM

## 2012-02-29 DIAGNOSIS — I509 Heart failure, unspecified: Secondary | ICD-10-CM

## 2012-02-29 DIAGNOSIS — Z23 Encounter for immunization: Secondary | ICD-10-CM

## 2012-02-29 DIAGNOSIS — I5022 Chronic systolic (congestive) heart failure: Secondary | ICD-10-CM

## 2012-02-29 DIAGNOSIS — F3289 Other specified depressive episodes: Secondary | ICD-10-CM

## 2012-02-29 DIAGNOSIS — F329 Major depressive disorder, single episode, unspecified: Secondary | ICD-10-CM

## 2012-02-29 MED ORDER — HYDROCODONE-ACETAMINOPHEN 5-500 MG PO TABS: ORAL_TABLET | ORAL | Status: DC

## 2012-02-29 MED ORDER — FLUOXETINE HCL 10 MG PO CAPS: 10.0000 mg | ORAL_CAPSULE | Freq: Every day | ORAL | Status: DC

## 2012-02-29 NOTE — Progress Notes (Signed)
  Subjective:    Patient ID: [name], male    DOB: September 25, 1970, 41 y.o.   MRN: [id number]  HPI   Patient here is to follow chronic medical problems. He's not her from neurosurgery regarding his back pain. He is out of pain medication. He was seen by cardiology and will be followed in pacemaker clinic Has a lot of stress and anxiety he states that he feels "ill " all the time and anger because of his medical problems, inability to work, and being a single parent   Review of Systems  GEN- denies fatigue, fever, weight loss,weakness, recent illness HEENT- denies eye drainage, change in vision, nasal discharge, CVS- denies chest pain, palpitations RESP- denies SOB, cough, wheeze ABD- denies N/V, change in stools, abd pain GU- denies dysuria, hematuria, dribbling, incontinence MSK- + joint pain, muscle aches, injury Neuro- denies headache, dizziness, syncope, seizure activity      Objective:   Physical Exam GEN- NAD, alert and oriented x3 HEENT- PERRL, EOMI, non injected sclera, pink conjunctiva, MMM, oropharynx clear Neck- Supple, no thryomegaly CVS- RRR, no murmur RESP-CTAB ABD-NABS,soft,NT,D EXT- No edema Pulses- Radial, DP- 2+ Skin- small scab on left shoulder blade, no fluctuance, no induration, no cyst palpated  Pysch- flat affect, not overly anxious, no SI, no hallucinations      Assessment & Plan:

## 2012-02-29 NOTE — Patient Instructions (Addendum)
Flu shot given New referral for back pain Pain meds refilled Continue all other medications F/U 2 months

## 2012-03-02 NOTE — Assessment & Plan Note (Signed)
Will refer again to neurosurgery, unsure what happened with first one. On pain contract

## 2012-03-02 NOTE — Assessment & Plan Note (Signed)
He is in agreement to start treatment, prozac 10mg 

## 2012-03-02 NOTE — Assessment & Plan Note (Signed)
Currently compensated, reviewed cardiology note

## 2012-03-03 ENCOUNTER — Telehealth: Payer: Self-pay | Admitting: Family Medicine

## 2012-03-03 NOTE — Telephone Encounter (Signed)
Patient is aware 

## 2012-03-05 ENCOUNTER — Ambulatory Visit (INDEPENDENT_AMBULATORY_CARE_PROVIDER_SITE_OTHER): Payer: Medicaid Other | Admitting: *Deleted

## 2012-03-05 DIAGNOSIS — I420 Dilated cardiomyopathy: Secondary | ICD-10-CM

## 2012-03-05 DIAGNOSIS — I428 Other cardiomyopathies: Secondary | ICD-10-CM

## 2012-03-05 LAB — ICD DEVICE OBSERVATION
CHARGE TIME: 9.2 s
DEVICE MODEL ICD: 118994
RV LEAD AMPLITUDE: 25 mv
RV LEAD IMPEDENCE ICD: 591 Ohm
VENTRICULAR PACING ICD: 1 pct

## 2012-03-05 NOTE — Progress Notes (Signed)
defib check in clinic  

## 2012-03-25 ENCOUNTER — Encounter: Payer: Self-pay | Admitting: Internal Medicine

## 2012-04-01 ENCOUNTER — Other Ambulatory Visit: Payer: Self-pay | Admitting: Neurosurgery

## 2012-04-01 ENCOUNTER — Other Ambulatory Visit (HOSPITAL_COMMUNITY): Payer: Self-pay | Admitting: Neurosurgery

## 2012-04-01 DIAGNOSIS — M542 Cervicalgia: Secondary | ICD-10-CM

## 2012-04-01 DIAGNOSIS — M545 Low back pain, unspecified: Secondary | ICD-10-CM

## 2012-04-11 ENCOUNTER — Ambulatory Visit (INDEPENDENT_AMBULATORY_CARE_PROVIDER_SITE_OTHER): Payer: Medicaid Other | Admitting: Family Medicine

## 2012-04-11 VITALS — BP 124/82 | HR 83 | Resp 15 | Ht 67.0 in | Wt 121.8 lb

## 2012-04-11 DIAGNOSIS — M549 Dorsalgia, unspecified: Secondary | ICD-10-CM

## 2012-04-11 DIAGNOSIS — F32A Depression, unspecified: Secondary | ICD-10-CM

## 2012-04-11 DIAGNOSIS — F329 Major depressive disorder, single episode, unspecified: Secondary | ICD-10-CM

## 2012-04-11 DIAGNOSIS — F3289 Other specified depressive episodes: Secondary | ICD-10-CM

## 2012-04-11 MED ORDER — FLUOXETINE HCL 20 MG PO CAPS: ORAL_CAPSULE | ORAL | Status: DC

## 2012-04-11 MED ORDER — HYDROCODONE-ACETAMINOPHEN 5-500 MG PO TABS: ORAL_TABLET | ORAL | Status: DC

## 2012-04-11 NOTE — Patient Instructions (Signed)
Stop the aspirin 7 days before along with  Zocor, prozac, vitamins Take the Coreg and Lasix   Pain medications refilled today  Prozac increased 20mg  ( take 2 of the 10mg  tablets)  F/U 4 months for routine visit

## 2012-04-11 NOTE — Assessment & Plan Note (Signed)
Reviewed neurosurgery note. Think that his abnormal images are due to effects of radiation as a child. He's had CT myelogram. I reviewed his medication list. Will have him stop the aspirin Prozac, zocorand vitamins one week before the procedure. He will continue his Coreg and Lasix. He may not be able to take these the morning of the procedure. Chronic pain meds refilled today.

## 2012-04-11 NOTE — Assessment & Plan Note (Signed)
Increase prozac to 20mg 

## 2012-04-11 NOTE — Progress Notes (Signed)
  Subjective:    Patient ID: [name], male    DOB: 12-02-1970, 41 y.o.   MRN: [id number]  HPI Patient here to follow up medications and mood. Her last visit he was started on Prozac 10 mg for depression. He states he's not felt any different on the medication. He was seen by neurosurgery for his chronic back pain and concern for compression fractures and abnormalities of the spine. He is to undergo CT myelogram and he was concerned about his medications and which ones he should hold before the procedure.   Review of Systems - per above   GEN- denies fatigue, fever, weight loss,weakness, recent illness CVS- denies chest pain, palpitations RESP- denies SOB, cough, wheeze MSK- + joint pain, muscle aches, injury       Objective:   Physical Exam GEN- NAD, alert and oriented x3 CVS- RRR, no murmur RESP-CTAB EXT- No edema Pulses- Radial, DP- 2+ Psych- flat affect, not overly anxious, PHQ-9 score 12, depressed appearing, no SI, no hallucinations       Assessment & Plan:

## 2012-04-23 ENCOUNTER — Ambulatory Visit (HOSPITAL_COMMUNITY): Payer: Medicaid Other

## 2012-04-23 ENCOUNTER — Inpatient Hospital Stay (HOSPITAL_COMMUNITY): Admission: RE | Admit: 2012-04-23 | Payer: Medicaid Other | Source: Ambulatory Visit

## 2012-04-28 ENCOUNTER — Ambulatory Visit: Payer: Medicaid Other | Admitting: Family Medicine

## 2012-05-06 ENCOUNTER — Other Ambulatory Visit: Payer: Self-pay

## 2012-05-06 MED ORDER — HYDROCODONE-ACETAMINOPHEN 5-500 MG PO TABS: ORAL_TABLET | ORAL | Status: DC

## 2012-05-12 ENCOUNTER — Ambulatory Visit (INDEPENDENT_AMBULATORY_CARE_PROVIDER_SITE_OTHER): Payer: Medicaid Other | Admitting: Internal Medicine

## 2012-05-12 ENCOUNTER — Encounter: Payer: Self-pay | Admitting: Internal Medicine

## 2012-05-12 VITALS — BP 112/78 | HR 70 | Ht 69.0 in | Wt 122.1 lb

## 2012-05-12 DIAGNOSIS — I509 Heart failure, unspecified: Secondary | ICD-10-CM

## 2012-05-12 DIAGNOSIS — I428 Other cardiomyopathies: Secondary | ICD-10-CM

## 2012-05-12 DIAGNOSIS — I420 Dilated cardiomyopathy: Secondary | ICD-10-CM

## 2012-05-12 DIAGNOSIS — I5022 Chronic systolic (congestive) heart failure: Secondary | ICD-10-CM

## 2012-05-12 DIAGNOSIS — Z95810 Presence of automatic (implantable) cardiac defibrillator: Secondary | ICD-10-CM

## 2012-05-12 LAB — ICD DEVICE OBSERVATION
BRDY-0002RV: 40 {beats}/min
DEVICE MODEL ICD: 118994
RV LEAD AMPLITUDE: 25 mv
RV LEAD IMPEDENCE ICD: 606 Ohm
RV LEAD THRESHOLD: 0.8 V

## 2012-05-12 NOTE — Patient Instructions (Signed)
Your physician recommends that you schedule a follow-up appointment in:  1 - 6 months for pacemaker check 2 - 1 year with Dr Ladona Ridgel

## 2012-05-12 NOTE — Assessment & Plan Note (Signed)
His [employer] single chamber ICD is working normally. Estimated longevity, 12 years. No sustained arrhythmias noted.

## 2012-05-12 NOTE — Assessment & Plan Note (Signed)
His chronic systolic heart failure is well compensated despite severe left ventricular dysfunction, EF 25%. He will continue his current medical therapy, and maintain a low-sodium diet.

## 2012-05-12 NOTE — Progress Notes (Signed)
HPI Mr. Uzzle returns today for followup. He is a very pleasant 41 year old man with a nonischemic cardiomyopathy, chronic systolic heart failure, status post ICD implantation. He has been bothersome with musculoskeletal chest pain. He has kyphoscoliosis. He received extensive radiation when he was young for treatment of a Wilms tumor. The patient denies syncope, chest pain, or peripheral edema. Despite his multiple problems, he remains active. Allergies  Allergen Reactions  . Ibuprofen Hives     Current Outpatient Prescriptions  Medication Sig Dispense Refill  . aspirin 81 MG tablet Take 81 mg by mouth daily.        . calcium-vitamin D (OSCAL WITH D) 500-200 MG-UNIT per tablet Take 1 tablet by mouth 2 (two) times daily.      . carvedilol (COREG) 12.5 MG tablet 6.25 mg 2 (two) times daily with a meal.       . FLUoxetine (PROZAC) 20 MG capsule Take 1 tablet daily  30 capsule  3  . furosemide (LASIX) 20 MG tablet TAKE ONE TABLET BY MOUTH DAILY  30 tablet  6  . HYDROcodone-acetaminophen (VICODIN) 5-500 MG per tablet TAKE ONE TABLET BY MOUTH TWICE DAILY AS NEEDED FOR PAIN  60 tablet  1  . Multiple Vitamins-Minerals (MULTIVITAMIN WITH MINERALS) tablet Take 1 tablet by mouth daily.        . simvastatin (ZOCOR) 20 MG tablet Take 1 tablet (20 mg total) by mouth every evening.  30 tablet  6     Past Medical History  Diagnosis Date  . Cardiomyopathy, nonischemic 10/2010    Presented with congestive heart failure and EF of 15%; improves to 25% after initiation of medical therapy;  . Tobacco abuse     30 pack years  . Orthostatic hypotension   . Alcohol ingestion of one to four drinks per day     Equivalent of 4-5 ounces of liquor in beer  . Scoliosis   . Fasting hyperglycemia 06/20/2011  . Wilm's tumor     nephrectomy-age 22/ Radiation and Chemo    ROS:   All systems reviewed and negative except as noted in the HPI.   Past Surgical History  Procedure Date  . Nephrectomy     Wilms Tumor   . Icd 05/25/2011    [employer] End[employer]ak [employer] SG lead/Energen single chamber device     No family history on file.   History   Social History  . Marital Status: Legally Separated    Spouse Name: N/A    Number of Children: 1  . Years of Education: N/A   Occupational History  . full time     Engineer, petroleum   Social History Main Topics  . Smoking status: Current Some Day Smoker -- 0.8 packs/day  . Smokeless tobacco: Never Used  . Alcohol Use: Yes     Comment: OCCASIONAL  . Drug Use: No  . Sexually Active: Not Currently   Other Topics Concern  . Not on file   Social History Narrative  . No narrative on file     BP 112/78  Pulse 70  Ht 5\' 9"  (1.753 m)  Wt 122 lb 1.9 oz (55.393 kg)  BMI 18.03 kg/m2  SpO2 97%  Physical Exam:  Well appearing 41 year old man, NAD HEENT: Unremarkable Neck:  No JVD, no thyromegally Back:  No CVA tenderness, marked kyphoscoliosis. Lungs:  Clear with no wheezes, rales, or rhonchi. Well-healed ICD incision. HEART:  Regular rate rhythm, no murmurs, no rubs, no clicks Abd:  soft, scaphoid, positive  bowel sounds, no organomegally, no rebound, no guarding Ext:  2 plus pulses, no edema, no cyanosis, no clubbing Skin:  No rashes no nodules Neuro:  CN II through XII intact, motor grossly intact  DEVICE  Normal device function.  See PaceArt for details.   Assess/Plan:

## 2012-05-19 ENCOUNTER — Other Ambulatory Visit: Payer: Self-pay | Admitting: Internal Medicine

## 2012-05-27 ENCOUNTER — Other Ambulatory Visit: Payer: Self-pay

## 2012-05-27 MED ORDER — HYDROCODONE-ACETAMINOPHEN 5-325 MG PO TABS: 1.0000 | ORAL_TABLET | Freq: Two times a day (BID) | ORAL | Status: DC | PRN

## 2012-06-26 ENCOUNTER — Ambulatory Visit (INDEPENDENT_AMBULATORY_CARE_PROVIDER_SITE_OTHER): Payer: Medicaid Other | Admitting: Family Medicine

## 2012-06-26 ENCOUNTER — Encounter: Payer: Self-pay | Admitting: Family Medicine

## 2012-06-26 VITALS — BP 124/70 | HR 92 | Resp 18 | Ht 67.0 in | Wt 124.1 lb

## 2012-06-26 DIAGNOSIS — M79609 Pain in unspecified limb: Secondary | ICD-10-CM

## 2012-06-26 DIAGNOSIS — E785 Hyperlipidemia, unspecified: Secondary | ICD-10-CM

## 2012-06-26 DIAGNOSIS — F32A Depression, unspecified: Secondary | ICD-10-CM

## 2012-06-26 DIAGNOSIS — M549 Dorsalgia, unspecified: Secondary | ICD-10-CM

## 2012-06-26 DIAGNOSIS — I509 Heart failure, unspecified: Secondary | ICD-10-CM

## 2012-06-26 DIAGNOSIS — I5022 Chronic systolic (congestive) heart failure: Secondary | ICD-10-CM

## 2012-06-26 DIAGNOSIS — Z720 Tobacco use: Secondary | ICD-10-CM

## 2012-06-26 DIAGNOSIS — F329 Major depressive disorder, single episode, unspecified: Secondary | ICD-10-CM

## 2012-06-26 DIAGNOSIS — M79643 Pain in unspecified hand: Secondary | ICD-10-CM

## 2012-06-26 DIAGNOSIS — F3289 Other specified depressive episodes: Secondary | ICD-10-CM

## 2012-06-26 DIAGNOSIS — R7309 Other abnormal glucose: Secondary | ICD-10-CM

## 2012-06-26 DIAGNOSIS — F17200 Nicotine dependence, unspecified, uncomplicated: Secondary | ICD-10-CM

## 2012-06-26 DIAGNOSIS — R7302 Impaired glucose tolerance (oral): Secondary | ICD-10-CM

## 2012-06-26 DIAGNOSIS — I10 Essential (primary) hypertension: Secondary | ICD-10-CM

## 2012-06-26 MED ORDER — HYDROCODONE-ACETAMINOPHEN 5-325 MG PO TABS: 1.0000 | ORAL_TABLET | Freq: Two times a day (BID) | ORAL | Status: DC | PRN

## 2012-06-26 NOTE — Patient Instructions (Addendum)
Get fasting labs Call if hand swells again We will call neurosurgery and see what is going on with scan  F/U 4 months

## 2012-06-29 ENCOUNTER — Encounter: Payer: Self-pay | Admitting: Family Medicine

## 2012-06-29 DIAGNOSIS — R7302 Impaired glucose tolerance (oral): Secondary | ICD-10-CM | POA: Insufficient documentation

## 2012-06-29 DIAGNOSIS — M79643 Pain in unspecified hand: Secondary | ICD-10-CM | POA: Insufficient documentation

## 2012-06-29 NOTE — Assessment & Plan Note (Signed)
Chronic back pain, he was to have CT myleogram by Neurosurgery but this still has not been done, pain persist, on pain contract

## 2012-06-29 NOTE — Progress Notes (Signed)
  Subjective:    Patient ID: [name], male    DOB: 1970/08/14, 42 y.o.   MRN: [id number]  HPI Pt here to f/u chronic medical problems, concerned about 2 episodes of left hand swelling and pain at wrist, no injury, no current pain, states he almost went to ER Taking prozac it has helped some, still gets anxious in cars  Cariology- seen by cards, weight has been stable, pacer interrogated Back pain- has not heard back for surgeon office regarding CT of back   Review of Systems   GEN- denies fatigue, fever, weight loss,weakness, recent illness HEENT- denies eye drainage, change in vision, nasal discharge, CVS- denies chest pain, palpitations RESP- denies SOB, cough, wheeze ABD- denies N/V, change in stools, abd pain GU- denies dysuria, hematuria, dribbling, incontinence MSK- + joint pain, muscle aches, injury Neuro- denies headache, dizziness, syncope, seizure activity      Objective:   Physical Exam GEN- NAD, alert and oriented x3 HEENT- PERRL, EOMI, non injected sclera, pink conjunctiva, MMM, oropharynx clear Neck- Supple,  CVS- RRR, no murmur RESP-CTAB EXT- No edema MSK- normal inspection bialt hands, no hand joint swelling, no rash, neg finkelstein, neg tinel/phalens, rmal ROM, sensation in tact Pulses- Radial, DP- 2+ Psych- flat affect, not anxious appearing       Assessment & Plan:

## 2012-06-29 NOTE — Assessment & Plan Note (Signed)
Currently compensated, continues to get soreness at site of pacer

## 2012-06-29 NOTE — Assessment & Plan Note (Signed)
Symptoms have improved he continues to have some anxious movements, will continue prozac at current dose

## 2012-06-29 NOTE — Assessment & Plan Note (Signed)
Continues to cut back. 

## 2012-06-29 NOTE — Assessment & Plan Note (Signed)
Check A1C 

## 2012-06-29 NOTE — Assessment & Plan Note (Signed)
Normal exam today, no swelling seen, he will call if this occurs again

## 2012-07-03 LAB — CBC
HCT: 43.6 % (ref 39.0–52.0)
MCH: 33.2 pg (ref 26.0–34.0)
MCV: 94.6 fL (ref 78.0–100.0)
Platelets: 213 10*3/uL (ref 150–400)
RDW: 14.4 % (ref 11.5–15.5)
WBC: 4.5 10*3/uL (ref 4.0–10.5)

## 2012-07-03 LAB — COMPREHENSIVE METABOLIC PANEL
ALT: 33 U/L (ref 0–53)
AST: 54 U/L — ABNORMAL HIGH (ref 0–37)
Albumin: 4.2 g/dL (ref 3.5–5.2)
Alkaline Phosphatase: 77 U/L (ref 39–117)
BUN: 4 mg/dL — ABNORMAL LOW (ref 6–23)
Calcium: 9.3 mg/dL (ref 8.4–10.5)
Chloride: 98 mEq/L (ref 96–112)
Potassium: 5.3 mEq/L (ref 3.5–5.3)
Sodium: 135 mEq/L (ref 135–145)
Total Protein: 6.6 g/dL (ref 6.0–8.3)

## 2012-07-03 LAB — LIPID PANEL
Cholesterol: 217 mg/dL — ABNORMAL HIGH (ref 0–200)
LDL Cholesterol: 111 mg/dL — ABNORMAL HIGH (ref 0–99)
Total CHOL/HDL Ratio: 2.3 Ratio
VLDL: 11 mg/dL (ref 0–40)

## 2012-07-12 ENCOUNTER — Other Ambulatory Visit: Payer: Self-pay | Admitting: Cardiology

## 2012-08-05 ENCOUNTER — Encounter: Payer: Self-pay | Admitting: Adult Health

## 2012-08-05 ENCOUNTER — Telehealth: Payer: Self-pay | Admitting: Internal Medicine

## 2012-08-05 ENCOUNTER — Ambulatory Visit (INDEPENDENT_AMBULATORY_CARE_PROVIDER_SITE_OTHER): Payer: Medicaid Other | Admitting: Adult Health

## 2012-08-05 VITALS — BP 127/84 | HR 82 | Ht 69.0 in | Wt 124.0 lb

## 2012-08-05 DIAGNOSIS — F17200 Nicotine dependence, unspecified, uncomplicated: Secondary | ICD-10-CM

## 2012-08-05 DIAGNOSIS — I5022 Chronic systolic (congestive) heart failure: Secondary | ICD-10-CM

## 2012-08-05 DIAGNOSIS — I420 Dilated cardiomyopathy: Secondary | ICD-10-CM

## 2012-08-05 DIAGNOSIS — Z720 Tobacco use: Secondary | ICD-10-CM

## 2012-08-05 DIAGNOSIS — I509 Heart failure, unspecified: Secondary | ICD-10-CM

## 2012-08-05 DIAGNOSIS — Z95810 Presence of automatic (implantable) cardiac defibrillator: Secondary | ICD-10-CM

## 2012-08-05 DIAGNOSIS — I428 Other cardiomyopathies: Secondary | ICD-10-CM

## 2012-08-05 NOTE — Assessment & Plan Note (Signed)
Follow up with Dr. Ladona Ridgel on previously scheduled visit in March of 2014.

## 2012-08-05 NOTE — Assessment & Plan Note (Signed)
Although we have talked about this at length on numerous occasions, he has no plans to quit at this time. He is aware of the risks involved to his health.

## 2012-08-05 NOTE — Patient Instructions (Signed)
Your physician recommends that you schedule a follow-up appointment in: 6 months  Your physician has requested that you have an echocardiogram. Echocardiography is a painless test that uses sound waves to create images of your heart. It provides your doctor with information about the size and shape of your heart and how well your heart's chambers and valves are working. This procedure takes approximately one hour. There are no restrictions for this procedure.    

## 2012-08-05 NOTE — Assessment & Plan Note (Signed)
Doing well and is well compensated without evidence of fluid overload or symptoms. Will repeat his echocardiogram on the day he comes for pacemaker follow up.  Continue current medical management.

## 2012-08-05 NOTE — Progress Notes (Deleted)
Name: [name]    DOB: 1971-02-03  Age: 42 y.o.  MR#: [id number]       PCP:  Milinda Antis, MD      Insurance: Payor: MEDICAID [place]  Plan: MEDICAID [place] ACCESS  Product Type: *No Product type*    CC:   No chief complaint on file.   VS Filed Vitals:   08/05/12 1306  BP: 127/84  Pulse: 82  Height: 5\' 9"  (1.753 m)  Weight: 124 lb (56.246 kg)    Weights Current Weight  08/05/12 124 lb (56.246 kg)  06/26/12 124 lb 1.3 oz (56.282 kg)  05/12/12 122 lb 1.9 oz (55.393 kg)    Blood Pressure  BP Readings from Last 3 Encounters:  08/05/12 127/84  06/26/12 124/70  05/12/12 112/78     Admit date:  (Not on file) Last encounter with RMR:  Visit date not found   Allergy Ibuprofen  Current Outpatient Prescriptions  Medication Sig Dispense Refill  . aspirin 81 MG tablet Take 81 mg by mouth daily.        . calcium-vitamin D (OSCAL WITH D) 500-200 MG-UNIT per tablet Take 1 tablet by mouth 2 (two) times daily.      . carvedilol (COREG) 12.5 MG tablet 6.25 mg 2 (two) times daily with a meal.       . carvedilol (COREG) 12.5 MG tablet TAKE ONE TABLET BY MOUTH TWICE DAILY WITH A MEAL  60 tablet  5  . FLUoxetine (PROZAC) 20 MG capsule Take 1 tablet daily  30 capsule  3  . furosemide (LASIX) 20 MG tablet TAKE ONE TABLET BY MOUTH DAILY  30 tablet  6  . HYDROcodone-acetaminophen (NORCO/VICODIN) 5-325 MG per tablet Take 1 tablet by mouth 2 (two) times daily as needed for pain.  80 tablet  0  . Multiple Vitamins-Minerals (MULTIVITAMIN WITH MINERALS) tablet Take 1 tablet by mouth daily.        . simvastatin (ZOCOR) 20 MG tablet Take 1 tablet (20 mg total) by mouth every evening.  30 tablet  6   No current facility-administered medications for this visit.    Discontinued Meds:   There are no discontinued medications.  Patient Active Problem List  Diagnosis  . Nonischemic dilated cardiomyopathy  . Tobacco abuse  . Wilm's tumor  . Alcohol ingestion of one to four drinks per day  .  Laboratory test  . Fatigue  . Depression  . ICD-[employer]  . Chronic systolic congestive heart failure  . Back pain  . Neck pain  . Scoliosis  . H/O compression fracture of spine  . Hand pain  . Glucose intolerance (impaired glucose tolerance)    LABS    Component Value Date/Time   NA 135 07/03/2012 0955   NA 136 10/25/2011 1040   NA 137 09/19/2011 1439   K 5.3 07/03/2012 0955   K 4.2 10/25/2011 1040   K 4.8 09/19/2011 1439   CL 98 07/03/2012 0955   CL 99 10/25/2011 1040   CL 99 09/19/2011 1439   CO2 29 07/03/2012 0955   CO2 27 10/25/2011 1040   CO2 30 09/19/2011 1439   GLUCOSE 88 07/03/2012 0955   GLUCOSE 105* 10/25/2011 1040   GLUCOSE 149* 09/19/2011 1439   BUN 4* 07/03/2012 0955   BUN 5* 10/25/2011 1040   BUN 4* 09/19/2011 1439   CREATININE 0.79 07/03/2012 0955   CREATININE 0.86 10/25/2011 1040   CREATININE 0.83 09/19/2011 1439   CREATININE 0.90 05/26/2011 1129  CREATININE 1.13 11/03/2010 0558   CREATININE 0.95 11/02/2010 0546   CALCIUM 9.3 07/03/2012 0955   CALCIUM 9.7 10/25/2011 1040   CALCIUM 9.4 09/19/2011 1439   GFRNONAA >90 05/26/2011 1129   GFRNONAA >60 11/03/2010 0558   GFRNONAA >60 11/02/2010 0546   GFRAA >90 05/26/2011 1129   GFRAA  Value: >60        The eGFR has been calculated using the MDRD equation. This calculation has not been validated in all clinical situations. eGFR's persistently <60 mL/min signify possible Chronic Kidney Disease. 11/03/2010 0558   GFRAA  Value: >60        The eGFR has been calculated using the MDRD equation. This calculation has not been validated in all clinical situations. eGFR's persistently <60 mL/min signify possible Chronic Kidney Disease. 11/02/2010 0546   CMP     Component Value Date/Time   NA 135 07/03/2012 0955   K 5.3 07/03/2012 0955   CL 98 07/03/2012 0955   CO2 29 07/03/2012 0955   GLUCOSE 88 07/03/2012 0955   BUN 4* 07/03/2012 0955   CREATININE 0.79 07/03/2012 0955   CREATININE 0.90 05/26/2011 1129   CALCIUM 9.3 07/03/2012 0955    PROT 6.6 07/03/2012 0955   ALBUMIN 4.2 07/03/2012 0955   AST 54* 07/03/2012 0955   ALT 33 07/03/2012 0955   ALKPHOS 77 07/03/2012 0955   BILITOT 0.6 07/03/2012 0955   GFRNONAA >90 05/26/2011 1129   GFRAA >90 05/26/2011 1129       Component Value Date/Time   WBC 4.5 07/03/2012 0955   WBC 4.3 10/25/2011 1040   WBC 4.9 05/21/2011 1207   HGB 15.3 07/03/2012 0955   HGB 15.0 10/25/2011 1040   HGB 15.8 05/21/2011 1207   HCT 43.6 07/03/2012 0955   HCT 45.7 10/25/2011 1040   HCT 46.5 05/21/2011 1207   MCV 94.6 07/03/2012 0955   MCV 97.6 10/25/2011 1040   MCV 96.9 05/21/2011 1207    Lipid Panel     Component Value Date/Time   CHOL 217* 07/03/2012 0955   TRIG 56 07/03/2012 0955   HDL 95 07/03/2012 0955   CHOLHDL 2.3 07/03/2012 0955   VLDL 11 07/03/2012 0955   LDLCALC 111* 07/03/2012 0955    ABG    Component Value Date/Time   PHART 7.394 01/02/2011 1353   PCO2ART 42.8 01/02/2011 1353   PO2ART 160.0* 01/02/2011 1353   HCO3 27.0* 01/02/2011 1354   TCO2 28 01/02/2011 1354   O2SAT 74.0 01/02/2011 1354     Lab Results  Component Value Date   TSH 1.403 07/03/2012   BNP (last 3 results) No results found for this basename: PROBNP,  in the last 8760 hours Cardiac Panel (last 3 results) No results found for this basename: CKTOTAL, CKMB, TROPONINI, RELINDX,  in the last 72 hours  Iron/TIBC/Ferritin    Component Value Date/Time   IRON 63 11/02/2010 0546   TIBC 400 11/02/2010 0546   FERRITIN 52 11/02/2010 0546     EKG Orders placed in visit on 08/05/12  . EKG 12-LEAD     Prior Assessment and Plan Problem List as of 08/05/2012     ICD-9-CM     Cardiology Problems   Nonischemic dilated cardiomyopathy   Last Assessment & Plan   10/22/2011 Office Visit Written 10/22/2011  9:30 PM by Salley Scarlet, MD     He did not tolerate ACEI, symptoms stable with use of low dose BB and diuretic    Chronic systolic congestive heart failure  Last Assessment & Plan   06/26/2012 Office Visit Written 06/29/2012  10:16 PM by Salley Scarlet, MD     Currently compensated, continues to get soreness at site of pacer      Other   Tobacco abuse   Last Assessment & Plan   06/26/2012 Office Visit Written 06/29/2012 10:17 PM by Salley Scarlet, MD     Continues to cut back    Wilm's tumor   Alcohol ingestion of one to four drinks per day   Last Assessment & Plan   07/25/2011 Office Visit Written 07/25/2011 11:50 AM by Salley Scarlet, MD     Patient states he has cut back on his alcohol intake.    Laboratory test   Fatigue   Last Assessment & Plan   10/22/2011 Office Visit Written 10/22/2011  9:30 PM by Salley Scarlet, MD     Improved, did not tolerate ACEI, continue BB for CHF    Depression   Last Assessment & Plan   06/26/2012 Office Visit Written 06/29/2012 10:17 PM by Salley Scarlet, MD     Symptoms have improved he continues to have some anxious movements, will continue prozac at current dose    ICD-[employer]   Last Assessment & Plan   05/12/2012 Office Visit Written 05/12/2012  9:06 AM by Marinus Maw, MD     His [facility] [place] Scientific single chamber ICD is working normally. Estimated longevity, 12 years. No sustained arrhythmias noted.    Back pain   Last Assessment & Plan   06/26/2012 Office Visit Written 06/29/2012 10:19 PM by Salley Scarlet, MD     Chronic back pain, he was to have CT myleogram by Neurosurgery but this still has not been done, pain persist, on pain contract    Neck pain   Last Assessment & Plan   10/22/2011 Office Visit Written 10/22/2011  9:29 PM by Salley Scarlet, MD     Check imaging, I think his pain is more from his scoliosis    Scoliosis   Last Assessment & Plan   01/01/2012 Office Visit Written 01/01/2012  4:36 PM by Salley Scarlet, MD     Thoracic scoliosis    H/O compression fracture of spine   Last Assessment & Plan   01/01/2012 Office Visit Written 01/01/2012  4:39 PM by Salley Scarlet, MD     Multiple remote compression fractures, metabolic  work-up negative , low bone density seen on Dexa, Bone scan revealed compression fractures, pt on calcium and Vit D recommended by Endocrinology    Hand pain   Last Assessment & Plan   06/26/2012 Office Visit Written 06/29/2012 10:15 PM by Salley Scarlet, MD     Normal exam today, no swelling seen, he will call if this occurs again    Glucose intolerance (impaired glucose tolerance)   Last Assessment & Plan   06/26/2012 Office Visit Written 06/29/2012 10:18 PM by Salley Scarlet, MD     Check A1C        Imaging: No results found.

## 2012-08-05 NOTE — Progress Notes (Signed)
HPI: [name] is a 42 year old patient of both Dr. Dietrich Pates and Dr. Sharrell Ku we are seeing for 6 month assessment and management with known history of nonischemic cardiomyopathy, systolic CHF, status post ICD implant for primary prevention, with ongoing history of arthritis pain and tobacco abuse. He is without cardiac complaint, is medically compliant and is eating properly. He denies chest pain, shortness of breath or palpitations.    Allergies  Allergen Reactions  . Ibuprofen Hives    Current Outpatient Prescriptions  Medication Sig Dispense Refill  . aspirin 81 MG tablet Take 81 mg by mouth daily.        . calcium-vitamin D (OSCAL WITH D) 500-200 MG-UNIT per tablet Take 1 tablet by mouth 2 (two) times daily.      . carvedilol (COREG) 12.5 MG tablet 6.25 mg 2 (two) times daily with a meal.       . carvedilol (COREG) 12.5 MG tablet TAKE ONE TABLET BY MOUTH TWICE DAILY WITH A MEAL  60 tablet  5  . FLUoxetine (PROZAC) 20 MG capsule Take 1 tablet daily  30 capsule  3  . furosemide (LASIX) 20 MG tablet TAKE ONE TABLET BY MOUTH DAILY  30 tablet  6  . HYDROcodone-acetaminophen (NORCO/VICODIN) 5-325 MG per tablet Take 1 tablet by mouth 2 (two) times daily as needed for pain.  80 tablet  0  . Multiple Vitamins-Minerals (MULTIVITAMIN WITH MINERALS) tablet Take 1 tablet by mouth daily.        . simvastatin (ZOCOR) 20 MG tablet Take 1 tablet (20 mg total) by mouth every evening.  30 tablet  6   No current facility-administered medications for this visit.    Past Medical History  Diagnosis Date  . Cardiomyopathy, nonischemic 10/2010    Presented with congestive heart failure and EF of 15%; improves to 25% after initiation of medical therapy;  . Tobacco abuse     30 pack years  . Orthostatic hypotension   . Alcohol ingestion of one to four drinks per day     Equivalent of 4-5 ounces of liquor in beer  . Scoliosis   . Fasting hyperglycemia 06/20/2011  . Wilm's tumor     nephrectomy-age  70/ Radiation and Chemo    Past Surgical History  Procedure Laterality Date  . Nephrectomy      Wilms Tumor  . Icd  05/25/2011    [employer] SG lead/[employer] single chamber device    ROS: Review of systems complete and found to be negative unless listed above  PHYSICAL EXAM BP 127/84  Pulse 82  Ht 5\' 9"  (1.753 m)  Wt 124 lb (56.246 kg)  BMI 18.3 kg/m2  General: Well developed, well nourished, in no acute distress Head: Eyes PERRLA, No xanthomas.   Normal cephalic and atramatic  Lungs: Clear bilaterally to auscultation and percussion. Heart: HRRR S1 S2, without MRG.  Pulses are 2+ & equal.            No carotid bruit. No JVD.  No abdominal bruits. No femoral bruits. Abdomen: Bowel sounds are positive, abdomen soft and non-tender without masses or                  Hernia's noted. Msk:  Back normal, normal gait. Normal strength and tone for age. Extremities: No clubbing, cyanosis or edema.  DP +1 Neuro: Alert and oriented X 3. Psych:  Good affect, responds appropriately  EKG: NSR with nonspecific lateral T-wave abnormality.  ASSESSMENT AND  PLAN

## 2012-08-05 NOTE — Assessment & Plan Note (Signed)
Well compensated. Continue current medical regimen.

## 2012-08-05 NOTE — Telephone Encounter (Signed)
Entered in Error

## 2012-08-05 NOTE — Telephone Encounter (Signed)
PT IS HAVING EPISODES OF TACKY AND NEEDS TO KNOW IF IT IS STILL OKAY TO TAKE A EXTRA METOPROLOL WHEN THIS HAPPENS

## 2012-08-12 ENCOUNTER — Encounter: Payer: Self-pay | Admitting: Family Medicine

## 2012-08-12 ENCOUNTER — Ambulatory Visit (INDEPENDENT_AMBULATORY_CARE_PROVIDER_SITE_OTHER): Payer: Medicaid Other | Admitting: Family Medicine

## 2012-08-12 VITALS — BP 120/70 | HR 78 | Resp 16 | Wt 123.1 lb

## 2012-08-12 DIAGNOSIS — F3289 Other specified depressive episodes: Secondary | ICD-10-CM

## 2012-08-12 DIAGNOSIS — E785 Hyperlipidemia, unspecified: Secondary | ICD-10-CM

## 2012-08-12 DIAGNOSIS — Z79899 Other long term (current) drug therapy: Secondary | ICD-10-CM

## 2012-08-12 DIAGNOSIS — F32A Depression, unspecified: Secondary | ICD-10-CM

## 2012-08-12 DIAGNOSIS — F17200 Nicotine dependence, unspecified, uncomplicated: Secondary | ICD-10-CM

## 2012-08-12 DIAGNOSIS — M4120 Other idiopathic scoliosis, site unspecified: Secondary | ICD-10-CM

## 2012-08-12 DIAGNOSIS — F329 Major depressive disorder, single episode, unspecified: Secondary | ICD-10-CM

## 2012-08-12 DIAGNOSIS — M419 Scoliosis, unspecified: Secondary | ICD-10-CM

## 2012-08-12 DIAGNOSIS — I5022 Chronic systolic (congestive) heart failure: Secondary | ICD-10-CM

## 2012-08-12 DIAGNOSIS — Z720 Tobacco use: Secondary | ICD-10-CM

## 2012-08-12 DIAGNOSIS — M549 Dorsalgia, unspecified: Secondary | ICD-10-CM

## 2012-08-12 DIAGNOSIS — I509 Heart failure, unspecified: Secondary | ICD-10-CM

## 2012-08-12 MED ORDER — HYDROCODONE-ACETAMINOPHEN 7.5-325 MG PO TABS: 1.0000 | ORAL_TABLET | Freq: Two times a day (BID) | ORAL | Status: DC | PRN

## 2012-08-12 NOTE — Assessment & Plan Note (Signed)
Per above contributing to chronic back pain

## 2012-08-12 NOTE — Assessment & Plan Note (Signed)
Overall doing well we'll continue Prozac at current dose

## 2012-08-12 NOTE — Assessment & Plan Note (Signed)
Discussed tobacco cessation he is now down to one to 2 cigarettes a day

## 2012-08-12 NOTE — Assessment & Plan Note (Signed)
Review cardiology note no changes to medications

## 2012-08-12 NOTE — Assessment & Plan Note (Signed)
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

## 2012-08-12 NOTE — Patient Instructions (Signed)
Pain medication increased  Continue all other medications Labs look good F/U 3 months

## 2012-08-12 NOTE — Progress Notes (Signed)
  Subjective:    Patient ID: [name], male    DOB: 20-Jan-1971, 42 y.o.   MRN: [id number]  HPI Patient here to followup depression and chronic pain. I spoke with neurosurgery his insurance does not cover the procedure they wanted but nothing further was ordered patient was notified of this and they plan to have another procedure in its place per the neuro surgeons recommendations. He states that his pain medication wears off a few times he is taking 2 tablets for his insurance will not allow him to get 80 like I gave him on the last prescription. States mentally he is doing well he has some stressors but overall feels the Prozac is helping. He is follow with cardiology for echocardiogram and to have his ICD tested  Review of Systems GEN- denies fatigue, fever, weight loss,weakness, recent illness HEENT- denies eye drainage, change in vision, nasal discharge, CVS- denies chest pain, palpitations RESP- denies SOB, cough, wheeze ABD- denies N/V, change in stools, abd pain GU- denies dysuria, hematuria, dribbling, incontinence MSK- + joint pain, muscle aches, injury Neuro- denies headache, dizziness, syncope, seizure activity      Objective:   Physical Exam  GEN- NAD, alert and oriented x3 HEENT- PERRL, EOMI, non injected sclera, pink conjunctiva, MMM, oropharynx clear Neck- Supple,  CVS- RRR, no murmur RESP-CTAB EXT- No edema Psych- flat affect, not overly depressed or anxious appearing - PHQ-9 score 8  Pulses- Radial 2+        Assessment & Plan:

## 2012-08-12 NOTE — Assessment & Plan Note (Signed)
Cholesterol much improved on zocor

## 2012-08-13 LAB — PRESCRIPTION ABUSE MONITORING 15P, URINE
Amphetamine/Meth: NEGATIVE ng/mL
Barbiturate Screen, Urine: NEGATIVE ng/mL
Carisoprodol, Urine: NEGATIVE ng/mL
Cocaine Metabolites: NEGATIVE ng/mL
Fentanyl, Ur: NEGATIVE ng/mL
Meperidine, Ur: NEGATIVE ng/mL
Oxycodone Screen, Ur: NEGATIVE ng/mL
Propoxyphene: NEGATIVE ng/mL

## 2012-08-25 ENCOUNTER — Other Ambulatory Visit (HOSPITAL_COMMUNITY): Payer: Self-pay | Admitting: Neurosurgery

## 2012-08-25 ENCOUNTER — Telehealth: Payer: Self-pay | Admitting: *Deleted

## 2012-08-25 ENCOUNTER — Other Ambulatory Visit: Payer: Self-pay | Admitting: Cardiology

## 2012-08-25 DIAGNOSIS — M545 Low back pain, unspecified: Secondary | ICD-10-CM

## 2012-08-25 DIAGNOSIS — M542 Cervicalgia: Secondary | ICD-10-CM

## 2012-08-25 MED ORDER — CARVEDILOL 12.5 MG PO TABS: 12.5000 mg | ORAL_TABLET | Freq: Two times a day (BID) | ORAL | Status: DC

## 2012-08-25 NOTE — Telephone Encounter (Signed)
Noted incoming request from pt pharmacy for refill, researched pt chart and sent medication requested via escribe for Coreg 12.5mg  BID, sent via escribe for pt 6 months supply to [facility]

## 2012-08-26 ENCOUNTER — Ambulatory Visit (INDEPENDENT_AMBULATORY_CARE_PROVIDER_SITE_OTHER): Payer: Medicaid Other | Admitting: *Deleted

## 2012-08-26 ENCOUNTER — Other Ambulatory Visit (HOSPITAL_COMMUNITY): Payer: Self-pay | Admitting: Neurosurgery

## 2012-08-26 ENCOUNTER — Other Ambulatory Visit: Payer: Self-pay | Admitting: Internal Medicine

## 2012-08-26 ENCOUNTER — Encounter: Payer: Self-pay | Admitting: Internal Medicine

## 2012-08-26 ENCOUNTER — Ambulatory Visit (HOSPITAL_COMMUNITY)
Admission: RE | Admit: 2012-08-26 | Discharge: 2012-08-26 | Disposition: A | Discharge status: Home / Self Care | Payer: Medicaid Other | Source: Ambulatory Visit | Attending: Adult Health | Admitting: Adult Health

## 2012-08-26 DIAGNOSIS — I5022 Chronic systolic (congestive) heart failure: Secondary | ICD-10-CM

## 2012-08-26 DIAGNOSIS — M545 Low back pain, unspecified: Secondary | ICD-10-CM

## 2012-08-26 DIAGNOSIS — M542 Cervicalgia: Secondary | ICD-10-CM

## 2012-08-26 DIAGNOSIS — I2589 Other forms of chronic ischemic heart disease: Secondary | ICD-10-CM | POA: Insufficient documentation

## 2012-08-26 DIAGNOSIS — I420 Dilated cardiomyopathy: Secondary | ICD-10-CM

## 2012-08-26 DIAGNOSIS — F17200 Nicotine dependence, unspecified, uncomplicated: Secondary | ICD-10-CM | POA: Insufficient documentation

## 2012-08-26 DIAGNOSIS — I428 Other cardiomyopathies: Secondary | ICD-10-CM

## 2012-08-26 DIAGNOSIS — I059 Rheumatic mitral valve disease, unspecified: Secondary | ICD-10-CM

## 2012-08-26 DIAGNOSIS — I509 Heart failure, unspecified: Secondary | ICD-10-CM

## 2012-08-26 LAB — ICD DEVICE OBSERVATION
BRDY-0002RV: 40 {beats}/min
DEVICE MODEL ICD: 118994
RV LEAD IMPEDENCE ICD: 594 Ohm
RV LEAD THRESHOLD: 0.8 V

## 2012-08-26 NOTE — Progress Notes (Signed)
*  PRELIMINARY RESULTS* Echocardiogram 2D Echocardiogram has been performed.  [name] 08/26/2012, 9:37 AM

## 2012-08-26 NOTE — Progress Notes (Signed)
ICD check 

## 2012-08-28 ENCOUNTER — Other Ambulatory Visit: Payer: Self-pay | Admitting: Neurosurgery

## 2012-08-29 ENCOUNTER — Ambulatory Visit (HOSPITAL_COMMUNITY)
Admission: RE | Admit: 2012-08-29 | Discharge: 2012-08-29 | Disposition: A | Discharge status: Home / Self Care | Payer: Medicaid Other | Source: Ambulatory Visit | Attending: Neurosurgery | Admitting: Neurosurgery

## 2012-08-29 DIAGNOSIS — M545 Low back pain, unspecified: Secondary | ICD-10-CM

## 2012-08-29 DIAGNOSIS — M129 Arthropathy, unspecified: Secondary | ICD-10-CM | POA: Insufficient documentation

## 2012-08-29 DIAGNOSIS — M542 Cervicalgia: Secondary | ICD-10-CM

## 2012-08-29 DIAGNOSIS — M79609 Pain in unspecified limb: Secondary | ICD-10-CM | POA: Insufficient documentation

## 2012-08-29 MED ORDER — OXYCODONE HCL 5 MG PO TABS
ORAL_TABLET | ORAL | Status: AC
  Filled 2012-08-29: qty 2

## 2012-08-29 MED ORDER — OXYCODONE HCL 5 MG PO TABS
5.0000 mg | ORAL_TABLET | ORAL | Status: DC | PRN
  Administered 2012-08-29: 10 mg via ORAL

## 2012-08-29 MED ORDER — IOHEXOL 300 MG/ML  SOLN
10.0000 mL | Freq: Once | INTRAMUSCULAR | Status: AC | PRN
  Administered 2012-08-29: 10 mL via INTRATHECAL

## 2012-08-29 MED ORDER — DIAZEPAM 5 MG PO TABS
ORAL_TABLET | ORAL | Status: AC
  Administered 2012-08-29: 10 mg
  Filled 2012-08-29: qty 2

## 2012-08-29 MED ORDER — ONDANSETRON HCL 4 MG/2ML IJ SOLN: 4.0000 mg | Freq: Four times a day (QID) | INTRAMUSCULAR | Status: DC | PRN

## 2012-08-29 MED ORDER — DIAZEPAM 5 MG PO TABS: 10.0000 mg | ORAL_TABLET | Freq: Once | ORAL | Status: DC

## 2012-08-29 NOTE — Op Note (Signed)
*   No surgery found * Lumbar and Cervical Myelogram  PATIENT:  [name] is a 42 y.o. male  PRE-OPERATIVE DIAGNOSIS:  Neck, low back pain  POST-OPERATIVE DIAGNOSIS:  Neck, low back pain  PROCEDURE:  Lumbar Myelogram L5/S1  SURGEON:  Robbie Rideaux  ANESTHESIA:   local LOCAL MEDICATIONS USED:  LIDOCAINE  and Amount: 6 ml Procedure Note:[name] was taken to the myelogram suite and placed prone on the fluoroscopic table. I prepped his back with betadine, then injected 6cc lidocaine into the lumbar area in the midline. I with fluoroscopic guidance then placed a spinal needle into the thecal sac at L5/S1 in the midline. I infiltrated 10cc 300 omnipaque into the thecal sac. Xray showed the dye to be in the correct space.    PATIENT DISPOSITION:  Short Stay

## 2012-09-29 ENCOUNTER — Other Ambulatory Visit: Payer: Self-pay | Admitting: Family Medicine

## 2012-09-29 ENCOUNTER — Other Ambulatory Visit: Payer: Self-pay | Admitting: Cardiology

## 2012-10-07 ENCOUNTER — Encounter: Payer: Self-pay | Admitting: *Deleted

## 2012-10-13 ENCOUNTER — Other Ambulatory Visit: Payer: Self-pay | Admitting: Family Medicine

## 2012-11-04 ENCOUNTER — Other Ambulatory Visit: Payer: Self-pay | Admitting: Family Medicine

## 2012-11-10 ENCOUNTER — Ambulatory Visit (INDEPENDENT_AMBULATORY_CARE_PROVIDER_SITE_OTHER): Payer: Medicaid Other | Admitting: *Deleted

## 2012-11-10 ENCOUNTER — Encounter: Payer: Self-pay | Admitting: Internal Medicine

## 2012-11-10 DIAGNOSIS — I509 Heart failure, unspecified: Secondary | ICD-10-CM

## 2012-11-10 DIAGNOSIS — I428 Other cardiomyopathies: Secondary | ICD-10-CM

## 2012-11-10 DIAGNOSIS — I5022 Chronic systolic (congestive) heart failure: Secondary | ICD-10-CM

## 2012-11-10 DIAGNOSIS — I420 Dilated cardiomyopathy: Secondary | ICD-10-CM

## 2012-11-10 LAB — ICD DEVICE OBSERVATION
BRDY-0002RV: 40 {beats}/min
DEV-0020ICD: NEGATIVE
RV LEAD AMPLITUDE: 25 mv
RV LEAD THRESHOLD: 0.9 V
VENTRICULAR PACING ICD: 1 pct

## 2012-11-10 NOTE — Progress Notes (Signed)
ICD check in office. 

## 2012-11-12 ENCOUNTER — Ambulatory Visit (INDEPENDENT_AMBULATORY_CARE_PROVIDER_SITE_OTHER): Payer: Medicaid Other | Admitting: Family Medicine

## 2012-11-12 VITALS — BP 112/70 | HR 80 | Temp 98.1°F | Resp 20 | Ht 66.5 in | Wt 124.0 lb

## 2012-11-12 DIAGNOSIS — M549 Dorsalgia, unspecified: Secondary | ICD-10-CM

## 2012-11-12 DIAGNOSIS — R209 Unspecified disturbances of skin sensation: Secondary | ICD-10-CM

## 2012-11-12 DIAGNOSIS — R202 Paresthesia of skin: Secondary | ICD-10-CM

## 2012-11-12 DIAGNOSIS — E785 Hyperlipidemia, unspecified: Secondary | ICD-10-CM

## 2012-11-12 DIAGNOSIS — I509 Heart failure, unspecified: Secondary | ICD-10-CM

## 2012-11-12 DIAGNOSIS — I5022 Chronic systolic (congestive) heart failure: Secondary | ICD-10-CM

## 2012-11-12 DIAGNOSIS — M25532 Pain in left wrist: Secondary | ICD-10-CM

## 2012-11-12 DIAGNOSIS — M25539 Pain in unspecified wrist: Secondary | ICD-10-CM

## 2012-11-12 NOTE — Patient Instructions (Addendum)
We will call with labs and any changes  Continue all other medications Get xray on your wrist You will be referred to Dr. Gerilyn Pilgrim for nerve conduction study  Call cardiology for a follow-up  I will call you about the neurosurgery information F/u 3 months

## 2012-11-12 NOTE — Progress Notes (Signed)
  Subjective:    Patient ID: [name], male    DOB: 09-Aug-1970, 42 y.o.   MRN: [id number]  HPI  Patient here to followup chronic medical problems. He has a couple of concerns today. He has not heard back from neurosurgery regarding his CT myelogram which was actually done in March he continues to have lower back pain as well as neck stiffness and pain.  Left wrist pain continues to have flares of his wrist with certain movements. Sometimes he has pain without any activity he denies any recent swelling of the wrist cannot remember any particular injury.  Right hand tingling and numbness. He's noticed in his right hand over the past month he's been unable to feel in his fourth and fifth digits as well as the palm of his hand compared to his left hand. He denies any particular injury. States he's unable to grasp like he typically does and feels like he is stumbling around when he is trying to fasten his belt buckle  He recently had a pacer check and was told that he had 2 episodes where he had tachycardia he does remember these episodes his heart rate was in the 190's  Review of Systems - per above  GEN- denies fatigue, fever, weight loss,weakness, recent illness HEENT- denies eye drainage, change in vision, nasal discharge, CVS- denies chest pain, palpitations RESP- denies SOB, cough, wheeze ABD- denies N/V, change in stools, abd pain GU- denies dysuria, hematuria, dribbling, incontinence MSK- + joint pain, muscle aches, injury Neuro- denies headache, dizziness, syncope, seizure activity      Objective:   Physical Exam  GEN- NAD, alert and oriented x3 HEENT- PERRL, EOMI, non injected sclera, pink conjunctiva, MMM, oropharynx clear Neck- Supple, fair ROM CVS- RRR, no murmur RESP-CTAB EXT- No edema NEURO-  Pulses- Radial 2+       Assessment & Plan:

## 2012-11-12 NOTE — Assessment & Plan Note (Signed)
Paresthesia in the right ulnar nerve distribution will send him for nerve conduction studies

## 2012-11-12 NOTE — Assessment & Plan Note (Addendum)
Left wrist pain possible arthritis in the wrist versus inflammation around the tendons. His exam is not suggestive of a true de Quervain's. I will have him get a plain x-rays does his chronic pain which is not improving. He has pain relievers unable to tolerate NSAIDs   Xray shows some small metallic fragments throughout hand, otherwise neg, pt continues to have considerable pain, refer to ortho for evaluation

## 2012-11-12 NOTE — Assessment & Plan Note (Signed)
Recheck fasting lipid panel as well as LFTs

## 2012-11-12 NOTE — Assessment & Plan Note (Signed)
Ongoing chronic back pain his CT myelogram showed nerve root irritation at L3 along with annular disc bulge at that same level. Unfortunately he's not her back from neurosurgery, I will call to see what the treatment plan would be.

## 2012-11-13 ENCOUNTER — Ambulatory Visit: Payer: Medicaid Other | Admitting: Family Medicine

## 2012-11-13 ENCOUNTER — Encounter: Payer: Self-pay | Admitting: Family Medicine

## 2012-11-13 NOTE — Assessment & Plan Note (Signed)
Currently compensated, he has some tachycardic episodes, will have him f/u with cards, due to his low normotensive BP and previous difficulty with higher doses of BP meds, will not change today

## 2012-11-14 ENCOUNTER — Ambulatory Visit (HOSPITAL_COMMUNITY)
Admission: RE | Admit: 2012-11-14 | Discharge: 2012-11-14 | Disposition: A | Discharge status: Home / Self Care | Payer: Medicaid Other | Source: Ambulatory Visit | Attending: Family Medicine | Admitting: Family Medicine

## 2012-11-14 ENCOUNTER — Other Ambulatory Visit: Payer: Self-pay | Admitting: Family Medicine

## 2012-11-14 DIAGNOSIS — M25532 Pain in left wrist: Secondary | ICD-10-CM

## 2012-11-14 DIAGNOSIS — M795 Residual foreign body in soft tissue: Secondary | ICD-10-CM | POA: Insufficient documentation

## 2012-11-14 DIAGNOSIS — M25539 Pain in unspecified wrist: Secondary | ICD-10-CM | POA: Insufficient documentation

## 2012-11-14 DIAGNOSIS — M7989 Other specified soft tissue disorders: Secondary | ICD-10-CM | POA: Insufficient documentation

## 2012-11-14 LAB — COMPREHENSIVE METABOLIC PANEL
BUN: 9 mg/dL (ref 6–23)
CO2: 26 mEq/L (ref 19–32)
Calcium: 9.3 mg/dL (ref 8.4–10.5)
Chloride: 100 mEq/L (ref 96–112)
Creat: 0.8 mg/dL (ref 0.50–1.35)
Glucose, Bld: 79 mg/dL (ref 70–99)

## 2012-11-14 LAB — LIPID PANEL
Cholesterol: 213 mg/dL — ABNORMAL HIGH (ref 0–200)
HDL: 81 mg/dL (ref >39)
Total CHOL/HDL Ratio: 2.6 Ratio

## 2012-11-15 NOTE — Addendum Note (Signed)
Addended by: Milinda Antis F on: 11/15/2012 06:28 AM   Modules accepted: Orders

## 2012-11-18 NOTE — Progress Notes (Signed)
Left message with pt to contact office

## 2012-11-19 NOTE — Progress Notes (Signed)
Orders have been placed, referrals are pending

## 2012-12-16 ENCOUNTER — Telehealth: Payer: Self-pay | Admitting: Family Medicine

## 2012-12-16 MED ORDER — HYDROCODONE-ACETAMINOPHEN 7.5-325 MG PO TABS: ORAL_TABLET | ORAL | Status: DC

## 2012-12-16 NOTE — Telephone Encounter (Signed)
Med phoned in °

## 2012-12-16 NOTE — Telephone Encounter (Signed)
Ok to refill 

## 2012-12-23 ENCOUNTER — Telehealth: Payer: Self-pay | Admitting: Family Medicine

## 2012-12-23 DIAGNOSIS — G5603 Carpal tunnel syndrome, bilateral upper limbs: Secondary | ICD-10-CM

## 2012-12-23 DIAGNOSIS — G5621 Lesion of ulnar nerve, right upper limb: Secondary | ICD-10-CM

## 2012-12-23 NOTE — Telephone Encounter (Signed)
Neurology called, pt had nerve conduction study done showing ulnar neuropathy, with some weakness. Needs urgent referral to Neurosurgery, will send to [facility] as he was seen there for his back

## 2012-12-29 ENCOUNTER — Telehealth: Payer: Self-pay | Admitting: Family Medicine

## 2012-12-29 ENCOUNTER — Other Ambulatory Visit: Payer: Self-pay | Admitting: Family Medicine

## 2012-12-29 DIAGNOSIS — R748 Abnormal levels of other serum enzymes: Secondary | ICD-10-CM

## 2012-12-29 NOTE — Telephone Encounter (Signed)
Message copied by Samuella Cota on Mon Dec 29, 2012  5:01 PM ------      Message from: Milinda Antis F      Created: Mon Dec 29, 2012  2:53 PM      Regarding: FW: f/u  LFT       Please remind patient to come get liver rechecked. He does not need to be fasting.       Place order for CMET- Dx elevated liver enzymes                  ----- Message -----         From: Salley Scarlet, MD         Sent: 11/17/2012   2:10 PM           To: Salley Scarlet, MD      Subject: f/u  LFT                                                        ------

## 2012-12-29 NOTE — Telephone Encounter (Signed)
Pt states he will come and have bloodwork done next Monday while he is in town

## 2013-01-05 ENCOUNTER — Other Ambulatory Visit: Payer: Medicaid Other

## 2013-01-05 ENCOUNTER — Other Ambulatory Visit: Payer: Self-pay | Admitting: Neurosurgery

## 2013-01-05 DIAGNOSIS — R748 Abnormal levels of other serum enzymes: Secondary | ICD-10-CM

## 2013-01-06 ENCOUNTER — Encounter (HOSPITAL_COMMUNITY): Payer: Self-pay | Admitting: Pharmacist

## 2013-01-06 LAB — COMPREHENSIVE METABOLIC PANEL
Albumin: 4.1 g/dL (ref 3.5–5.2)
Alkaline Phosphatase: 65 U/L (ref 39–117)
BUN: 7 mg/dL (ref 6–23)
CO2: 29 mEq/L (ref 19–32)
Glucose, Bld: 178 mg/dL — ABNORMAL HIGH (ref 70–99)
Total Bilirubin: 0.7 mg/dL (ref 0.3–1.2)

## 2013-01-08 ENCOUNTER — Encounter (HOSPITAL_COMMUNITY)
Admission: RE | Admit: 2013-01-08 | Discharge: 2013-01-08 | Disposition: A | Discharge status: Home / Self Care | Payer: Medicaid Other | Source: Ambulatory Visit | Attending: Anesthesiology | Admitting: Anesthesiology

## 2013-01-08 ENCOUNTER — Encounter (HOSPITAL_COMMUNITY): Payer: Self-pay

## 2013-01-08 ENCOUNTER — Encounter (HOSPITAL_COMMUNITY)
Admission: RE | Admit: 2013-01-08 | Discharge: 2013-01-08 | Disposition: A | Discharge status: Home / Self Care | Payer: Medicaid Other | Source: Ambulatory Visit | Attending: Neurosurgery | Admitting: Neurosurgery

## 2013-01-08 ENCOUNTER — Other Ambulatory Visit: Payer: Self-pay | Admitting: Family Medicine

## 2013-01-08 DIAGNOSIS — G5600 Carpal tunnel syndrome, unspecified upper limb: Secondary | ICD-10-CM | POA: Insufficient documentation

## 2013-01-08 DIAGNOSIS — R7303 Prediabetes: Secondary | ICD-10-CM

## 2013-01-08 DIAGNOSIS — I509 Heart failure, unspecified: Secondary | ICD-10-CM | POA: Insufficient documentation

## 2013-01-08 DIAGNOSIS — I428 Other cardiomyopathies: Secondary | ICD-10-CM | POA: Insufficient documentation

## 2013-01-08 DIAGNOSIS — Z01812 Encounter for preprocedural laboratory examination: Secondary | ICD-10-CM | POA: Insufficient documentation

## 2013-01-08 DIAGNOSIS — Z01818 Encounter for other preprocedural examination: Secondary | ICD-10-CM | POA: Insufficient documentation

## 2013-01-08 DIAGNOSIS — G5620 Lesion of ulnar nerve, unspecified upper limb: Secondary | ICD-10-CM | POA: Insufficient documentation

## 2013-01-08 DIAGNOSIS — R7989 Other specified abnormal findings of blood chemistry: Secondary | ICD-10-CM

## 2013-01-08 HISTORY — DX: Other chronic pain: G89.29

## 2013-01-08 HISTORY — DX: Depression, unspecified: F32.A

## 2013-01-08 HISTORY — DX: Unspecified osteoarthritis, unspecified site: M19.90

## 2013-01-08 HISTORY — DX: Dorsalgia, unspecified: M54.9

## 2013-01-08 HISTORY — DX: Hyperlipidemia, unspecified: E78.5

## 2013-01-08 HISTORY — DX: Major depressive disorder, single episode, unspecified: F32.9

## 2013-01-08 LAB — CBC
MCHC: 36.7 g/dL — ABNORMAL HIGH (ref 30.0–36.0)
Platelets: 188 10*3/uL (ref 150–400)
RDW: 13.7 % (ref 11.5–15.5)
WBC: 4.8 10*3/uL (ref 4.0–10.5)

## 2013-01-08 LAB — BASIC METABOLIC PANEL
BUN: 5 mg/dL — ABNORMAL LOW (ref 6–23)
Calcium: 9.6 mg/dL (ref 8.4–10.5)
Creatinine, Ser: 0.75 mg/dL (ref 0.50–1.35)
GFR calc Af Amer: >90 mL/min (ref >90)
GFR calc non Af Amer: >90 mL/min (ref >90)

## 2013-01-08 MED ORDER — CEFAZOLIN SODIUM-DEXTROSE 2-3 GM-% IV SOLR
2.0000 g | INTRAVENOUS | Status: AC
  Administered 2013-01-09: 2 g via INTRAVENOUS
  Filled 2013-01-08: qty 50

## 2013-01-08 NOTE — Progress Notes (Signed)
Barbara Cower from [employer] called back to state that anesthesia will place the magnet so he doesn't need to be here

## 2013-01-08 NOTE — Pre-Procedure Instructions (Signed)
[name]  01/08/2013   Your procedure is scheduled on:  Fri, Aug 1 @ 2:55 PM  Report to [facility] at 11:45 AM.  Call this number if you have problems the morning of surgery: [phone]   Remember:   Do not eat food or drink liquids after midnight.   Take these medicines the morning of surgery with A SIP OF WATER: Carvedilol(Coreg),Prozac(Fluoxetine),and Pain Pill(if needed)   Do not wear jewelry  Do not wear lotions, powders, or colognes. You may wear deodorant.  Men may shave face and neck.  Do not bring valuables to the hospital.  [facility] is not responsible                   for any belongings or valuables.  Contacts, dentures or bridgework may not be worn into surgery.  Leave suitcase in the car. After surgery it may be brought to your room.  For patients admitted to the hospital, checkout time is 11:00 AM the day of  discharge.   Patients discharged the day of surgery will not be allowed to drive  home.    Special Instructions: Shower using CHG 2 nights before surgery and the night before surgery.  If you shower the day of surgery use CHG.  Use special wash - you have one bottle of CHG for all showers.  You should use approximately 1/3 of the bottle for each shower.   Please read over the following fact sheets that you were given: Pain Booklet, Coughing and Deep Breathing and Surgical Site Infection Prevention

## 2013-01-08 NOTE — Progress Notes (Addendum)
Cardiologist is Dr.Rothbart in [place] and last visit in epic  Echo reports in epic with last one in 2014  Denies ever having a stress test  Heart cath report in epic from 2012  Dr.Duham with [facility] is medical MD  EKG in epic from 08-05-12  Denies cxr with yr

## 2013-01-08 NOTE — Progress Notes (Signed)
[employer] rep notified of pts upcoming surgery tomorrow and that his arrival time is for 1145 with surgery scheduled for 1455

## 2013-01-09 ENCOUNTER — Encounter (HOSPITAL_COMMUNITY)
Admission: RE | Disposition: A | Discharge status: Home / Self Care | Payer: Self-pay | Source: Ambulatory Visit | Attending: Neurosurgery

## 2013-01-09 ENCOUNTER — Encounter (HOSPITAL_COMMUNITY): Payer: Self-pay | Admitting: Anesthesiology

## 2013-01-09 ENCOUNTER — Ambulatory Visit (HOSPITAL_COMMUNITY)
Admission: RE | Admit: 2013-01-09 | Discharge: 2013-01-09 | Disposition: A | Discharge status: Home / Self Care | Payer: Medicaid Other | Source: Ambulatory Visit | Attending: Neurosurgery | Admitting: Neurosurgery

## 2013-01-09 ENCOUNTER — Ambulatory Visit (HOSPITAL_COMMUNITY): Payer: Medicaid Other | Admitting: Anesthesiology

## 2013-01-09 ENCOUNTER — Encounter (HOSPITAL_COMMUNITY): Payer: Self-pay | Admitting: Surgery

## 2013-01-09 HISTORY — PX: ULNAR NERVE TRANSPOSITION: SHX2595

## 2013-01-09 HISTORY — PX: CARPAL TUNNEL RELEASE: SHX101

## 2013-01-09 SURGERY — CARPAL TUNNEL RELEASE
Anesthesia: General | Site: Arm Upper | Laterality: Right | Wound class: Clean

## 2013-01-09 MED ORDER — GLYCOPYRROLATE 0.2 MG/ML IJ SOLN
INTRAMUSCULAR | Status: DC | PRN
  Administered 2013-01-09: 0.2 mg via INTRAVENOUS

## 2013-01-09 MED ORDER — MIDAZOLAM HCL 5 MG/5ML IJ SOLN
INTRAMUSCULAR | Status: DC | PRN
  Administered 2013-01-09: 2 mg via INTRAVENOUS

## 2013-01-09 MED ORDER — BACITRACIN ZINC 500 UNIT/GM EX OINT
TOPICAL_OINTMENT | CUTANEOUS | Status: DC | PRN
  Administered 2013-01-09: 1 via TOPICAL

## 2013-01-09 MED ORDER — HYDROCODONE-ACETAMINOPHEN 5-325 MG PO TABS
2.0000 | ORAL_TABLET | ORAL | Status: AC
  Administered 2013-01-09: 2 via ORAL

## 2013-01-09 MED ORDER — ETOMIDATE 2 MG/ML IV SOLN
INTRAVENOUS | Status: DC | PRN
  Administered 2013-01-09: 14 mg via INTRAVENOUS

## 2013-01-09 MED ORDER — HYDROCODONE-ACETAMINOPHEN 5-325 MG PO TABS
ORAL_TABLET | ORAL | Status: AC
  Filled 2013-01-09: qty 2

## 2013-01-09 MED ORDER — HYDROCODONE-ACETAMINOPHEN 7.5-300 MG PO TABS: 1.0000 | ORAL_TABLET | Freq: Four times a day (QID) | ORAL | Status: DC | PRN

## 2013-01-09 MED ORDER — CARVEDILOL 6.25 MG PO TABS
18.7500 mg | ORAL_TABLET | Freq: Once | ORAL | Status: AC
  Administered 2013-01-09: 18.75 mg via ORAL

## 2013-01-09 MED ORDER — EPHEDRINE SULFATE 50 MG/ML IJ SOLN
INTRAMUSCULAR | Status: DC | PRN
  Administered 2013-01-09 (×3): 5 mg via INTRAVENOUS

## 2013-01-09 MED ORDER — OXYCODONE-ACETAMINOPHEN 5-325 MG PO TABS: 2.0000 | ORAL_TABLET | ORAL | Status: AC

## 2013-01-09 MED ORDER — CARVEDILOL 12.5 MG PO TABS
ORAL_TABLET | ORAL | Status: AC
  Filled 2013-01-09: qty 2

## 2013-01-09 MED ORDER — FENTANYL CITRATE 0.05 MG/ML IJ SOLN
INTRAMUSCULAR | Status: DC | PRN
  Administered 2013-01-09: 50 ug via INTRAVENOUS

## 2013-01-09 MED ORDER — ONDANSETRON HCL 4 MG/2ML IJ SOLN
INTRAMUSCULAR | Status: DC | PRN
  Administered 2013-01-09: 4 mg via INTRAVENOUS

## 2013-01-09 MED ORDER — PHENYLEPHRINE HCL 10 MG/ML IJ SOLN
INTRAMUSCULAR | Status: DC | PRN
  Administered 2013-01-09 (×4): 80 ug via INTRAVENOUS

## 2013-01-09 MED ORDER — PROPOFOL 10 MG/ML IV BOLUS
INTRAVENOUS | Status: DC | PRN
  Administered 2013-01-09 (×2): 20 mg via INTRAVENOUS

## 2013-01-09 MED ORDER — LIDOCAINE-EPINEPHRINE 0.5 %-1:200000 IJ SOLN
INTRAMUSCULAR | Status: DC | PRN
  Administered 2013-01-09: 7 mL
  Administered 2013-01-09: 3 mL

## 2013-01-09 MED ORDER — CARVEDILOL 12.5 MG PO TABS
12.5000 mg | ORAL_TABLET | Freq: Once | ORAL | Status: DC
  Filled 2013-01-09 (×3): qty 1

## 2013-01-09 MED ORDER — 0.9 % SODIUM CHLORIDE (POUR BTL) OPTIME
TOPICAL | Status: DC | PRN
  Administered 2013-01-09: 1000 mL

## 2013-01-09 MED ORDER — LIDOCAINE HCL (CARDIAC) 20 MG/ML IV SOLN
INTRAVENOUS | Status: DC | PRN
  Administered 2013-01-09: 40 mg via INTRAVENOUS

## 2013-01-09 MED ORDER — LACTATED RINGERS IV SOLN
INTRAVENOUS | Status: DC
  Administered 2013-01-09: 12:00:00 via INTRAVENOUS

## 2013-01-09 MED ORDER — PHENYLEPHRINE HCL 10 MG/ML IJ SOLN
10.0000 mg | INTRAVENOUS | Status: DC | PRN
  Administered 2013-01-09: 25 ug/min via INTRAVENOUS

## 2013-01-09 MED ORDER — HYDROMORPHONE HCL PF 1 MG/ML IJ SOLN: 0.2500 mg | INTRAMUSCULAR | Status: DC | PRN

## 2013-01-09 MED ORDER — LACTATED RINGERS IV SOLN
INTRAVENOUS | Status: DC | PRN
  Administered 2013-01-09 (×2): via INTRAVENOUS

## 2013-01-09 MED ORDER — ALBUMIN HUMAN 5 % IV SOLN
INTRAVENOUS | Status: DC | PRN
  Administered 2013-01-09: 15:00:00 via INTRAVENOUS

## 2013-01-09 SURGICAL SUPPLY — 64 items
BANDAGE ELASTIC 3 VELCRO ST LF (GAUZE/BANDAGES/DRESSINGS) ×3 IMPLANT
BANDAGE GAUZE ELAST BULKY 4 IN (GAUZE/BANDAGES/DRESSINGS) ×3 IMPLANT
BLADE SURG 10 STRL SS (BLADE) ×3 IMPLANT
BLADE SURG 15 STRL LF DISP TIS (BLADE) ×2 IMPLANT
BLADE SURG 15 STRL SS (BLADE) ×1
CLOTH BEACON ORANGE TIMEOUT ST (SAFETY) ×3 IMPLANT
CORDS BIPOLAR (ELECTRODE) ×3 IMPLANT
DECANTER SPIKE VIAL GLASS SM (MISCELLANEOUS) ×3 IMPLANT
DERMABOND ADVANCED (GAUZE/BANDAGES/DRESSINGS) ×1
DERMABOND ADVANCED .7 DNX12 (GAUZE/BANDAGES/DRESSINGS) ×2 IMPLANT
DRAPE EXTREMITY T 121X128X90 (DRAPE) ×3 IMPLANT
DRESSING TELFA 8X3 (GAUZE/BANDAGES/DRESSINGS) ×3 IMPLANT
DURAPREP 26ML APPLICATOR (WOUND CARE) ×3 IMPLANT
ELECT CAUTERY BLADE 6.4 (BLADE) ×3 IMPLANT
GAUZE SPONGE 4X4 16PLY XRAY LF (GAUZE/BANDAGES/DRESSINGS) ×3 IMPLANT
GLOVE BIO SURGEON STRL SZ 6.5 (GLOVE) IMPLANT
GLOVE BIO SURGEON STRL SZ7 (GLOVE) IMPLANT
GLOVE BIO SURGEON STRL SZ7.5 (GLOVE) ×3 IMPLANT
GLOVE BIO SURGEON STRL SZ8 (GLOVE) IMPLANT
GLOVE BIO SURGEON STRL SZ8.5 (GLOVE) IMPLANT
GLOVE BIOGEL M 8.0 STRL (GLOVE) IMPLANT
GLOVE BIOGEL PI IND STRL 8 (GLOVE) ×6 IMPLANT
GLOVE BIOGEL PI INDICATOR 8 (GLOVE) ×3
GLOVE ECLIPSE 6.5 STRL STRAW (GLOVE) ×3 IMPLANT
GLOVE ECLIPSE 7.0 STRL STRAW (GLOVE) IMPLANT
GLOVE ECLIPSE 7.5 STRL STRAW (GLOVE) ×15 IMPLANT
GLOVE ECLIPSE 8.0 STRL XLNG CF (GLOVE) IMPLANT
GLOVE ECLIPSE 8.5 STRL (GLOVE) IMPLANT
GLOVE EXAM NITRILE LRG STRL (GLOVE) IMPLANT
GLOVE EXAM NITRILE MD LF STRL (GLOVE) IMPLANT
GLOVE EXAM NITRILE XL STR (GLOVE) IMPLANT
GLOVE EXAM NITRILE XS STR PU (GLOVE) IMPLANT
GLOVE INDICATOR 6.5 STRL GRN (GLOVE) IMPLANT
GLOVE INDICATOR 7.0 STRL GRN (GLOVE) IMPLANT
GLOVE INDICATOR 7.5 STRL GRN (GLOVE) IMPLANT
GLOVE INDICATOR 8.0 STRL GRN (GLOVE) IMPLANT
GLOVE INDICATOR 8.5 STRL (GLOVE) IMPLANT
GLOVE OPTIFIT SS 8.0 STRL (GLOVE) IMPLANT
GLOVE SURG SS PI 6.5 STRL IVOR (GLOVE) IMPLANT
GOWN BRE IMP SLV AUR LG STRL (GOWN DISPOSABLE) IMPLANT
GOWN BRE IMP SLV AUR XL STRL (GOWN DISPOSABLE) IMPLANT
GOWN STRL REIN 2XL LVL4 (GOWN DISPOSABLE) IMPLANT
KIT BASIN OR (CUSTOM PROCEDURE TRAY) ×3 IMPLANT
KIT ROOM TURNOVER OR (KITS) ×3 IMPLANT
LOCATOR NERVE 3 VOLT (DISPOSABLE) ×3 IMPLANT
LOOP VESSEL MAXI BLUE (MISCELLANEOUS) ×3 IMPLANT
NEEDLE HYPO 25X1 1.5 SAFETY (NEEDLE) ×3 IMPLANT
NS IRRIG 1000ML POUR BTL (IV SOLUTION) ×3 IMPLANT
PACK SURGICAL SETUP 50X90 (CUSTOM PROCEDURE TRAY) ×3 IMPLANT
PAD ARMBOARD 7.5X6 YLW CONV (MISCELLANEOUS) ×6 IMPLANT
PENCIL BUTTON HOLSTER BLD 10FT (ELECTRODE) ×3 IMPLANT
SPONGE GAUZE 4X4 12PLY (GAUZE/BANDAGES/DRESSINGS) ×3 IMPLANT
STOCKINETTE 4X48 STRL (DRAPES) ×3 IMPLANT
SUT ETHILON 3 0 PS 1 (SUTURE) ×3 IMPLANT
SUT SILK 3 0 SH 30 (SUTURE) IMPLANT
SUT VIC AB 2-0 CT2 18 VCP726D (SUTURE) ×3 IMPLANT
SUT VIC AB 3-0 SH 8-18 (SUTURE) ×6 IMPLANT
SYR BULB 3OZ (MISCELLANEOUS) ×3 IMPLANT
SYR CONTROL 10ML LL (SYRINGE) ×3 IMPLANT
TOWEL OR 17X24 6PK STRL BLUE (TOWEL DISPOSABLE) ×3 IMPLANT
TOWEL OR 17X26 10 PK STRL BLUE (TOWEL DISPOSABLE) ×3 IMPLANT
TUBE CONNECTING 12X1/4 (SUCTIONS) ×3 IMPLANT
UNDERPAD 30X30 INCONTINENT (UNDERPADS AND DIAPERS) ×3 IMPLANT
WATER STERILE IRR 1000ML POUR (IV SOLUTION) ×3 IMPLANT

## 2013-01-09 NOTE — Anesthesia Preprocedure Evaluation (Addendum)
Anesthesia Evaluation  Patient identified by MRN, date of birth, ID band Patient awake    Reviewed: Allergy & Precautions, H&P , NPO status , Patient's Chart, lab work & pertinent test results, reviewed documented beta blocker date and time   Airway Mallampati: II TM Distance: >3 FB     Dental  (+) Dental Advisory Given, Edentulous Upper and Edentulous Lower   Pulmonary shortness of breath and with exertion,  breath sounds clear to auscultation        Cardiovascular +CHF + Cardiac Defibrillator Rhythm:Regular Rate:Normal     Neuro/Psych PSYCHIATRIC DISORDERS Depression    GI/Hepatic negative GI ROS, Neg liver ROS,   Endo/Other  Hx of hyperglycemia.  Renal/GU History noted of nephrectomy     Musculoskeletal   Abdominal   Peds  Hematology   Anesthesia Other Findings   Reproductive/Obstetrics                         Anesthesia Physical Anesthesia Plan  ASA: IV  Anesthesia Plan: General   Post-op Pain Management:    Induction: Intravenous  Airway Management Planned: LMA  Additional Equipment:   Intra-op Plan:   Post-operative Plan: Extubation in OR  Informed Consent: I have reviewed the patients History and Physical, chart, labs and discussed the procedure including the risks, benefits and alternatives for the proposed anesthesia with the patient or authorized representative who has indicated his/her understanding and acceptance.   Dental advisory given  Plan Discussed with: Anesthesiologist, Surgeon and CRNA  Anesthesia Plan Comments:        Anesthesia Quick Evaluation

## 2013-01-09 NOTE — Transfer of Care (Signed)
Immediate Anesthesia Transfer of Care Note  Patient: [name]  Procedure(s) Performed: Procedure(s) with comments: CARPAL TUNNEL RELEASE (Right) - RIGHT carpal tunnel release ULNAR NERVE DECOMPRESSION/TRANSPOSITION (Right) - RIGHT ulnar nerve decompression  Patient Location: PACU  Anesthesia Type:General  Level of Consciousness: awake, alert , oriented and patient cooperative  Airway & Oxygen Therapy: Patient Spontanous Breathing and Patient connected to nasal cannula oxygen  Post-op Assessment: Report given to PACU RN and Post -op Vital signs reviewed and stable  Post vital signs: Reviewed  Complications: No apparent anesthesia complications

## 2013-01-09 NOTE — Op Note (Signed)
01/09/2013  4:39 PM  PATIENT:  [name]  42 y.o. male with numbness and pain in the right hand. EMG, ncv showed fairly severe carpal tunnel disease and an ulnar neuropathy at the elbow.   PRE-OPERATIVE DIAGNOSIS:  carpal tunnel syndrome  lesion ulnar nerve  POST-OPERATIVE DIAGNOSIS:  carpal tunnel syndrome  lesion ulnar nerve  PROCEDURE:  Procedure(s):right CARPAL TUNNEL RELEASE ULNAR NERVE DECOMPRESSION/TRANSPOSITION  SURGEON:  Surgeon(s): Carmela Hurt, MD  ASSISTANTS:none  ANESTHESIA:   general  EBL:  Total I/O In: 1250 [I.V.:1000; IV Piggyback:250] Out: -   BLOOD ADMINISTERED:none  CELL SAVER GIVEN:none  COUNT:per nursing  DRAINS: none   SPECIMEN:  No Specimen  DICTATION: [name] was taken to the operating room, intubated and placed under a general anesthetic without difficulty. His right upper extremity was prepped from his fingers to the axilla. He was draped in a sterile manner. I made a semicircular incision with the medial epicondyle on the right as the top of the half circle. I opened the skin with a 10 blade and dissected sharply through the connective tissue to expose the ulnar nerve in the cubital tunnel. I used the nerve stimulator for positive id but the nerve's response was poor. I fully decompressed the nerve in the cubital tunnel, dividing the proximal portion of the flexor carpi ulnaris. I irrigated the wound then closed in 2 layers. I used vicryl sutures to approximate the subcutaneous and subcuticular planes. I used dermabond for a sterile dressing.   I then made my incision for the right carpal tunnel release. I started at the distal palmar crease and brought the incision into the palm. I opened the skin with a 10 blade and dissected sharply to the transverse carpal ligment. I divided the ligament with scissors and the 15 scalpel. I decompressed the median nerve from the proximal palmar crease to the termination of the ligament in the palm. I  irrigated the wound, then close in a single layer with vertical interrupted vertical mattress sutures using a nylon stitch. I placed a sterile dressing.   PLAN OF CARE: Discharge to home after PACU  PATIENT DISPOSITION:  PACU - hemodynamically stable.   Delay start of Pharmacological VTE agent (>24hrs) due to surgical blood loss or risk of bleeding:  yes

## 2013-01-09 NOTE — Anesthesia Postprocedure Evaluation (Signed)
  Anesthesia Post-op Note  Patient: [name]  Procedure(s) Performed: Procedure(s) with comments: CARPAL TUNNEL RELEASE (Right) - RIGHT carpal tunnel release ULNAR NERVE DECOMPRESSION/TRANSPOSITION (Right) - RIGHT ulnar nerve decompression  Patient Location: PACU  Anesthesia Type:General  Level of Consciousness: awake, alert  and oriented  Airway and Oxygen Therapy: Patient Spontanous Breathing  Post-op Pain: mild  Post-op Assessment: Post-op Vital signs reviewed, Patient's Cardiovascular Status Stable, Respiratory Function Stable, Patent Airway and No signs of Nausea or vomiting  Post-op Vital Signs: Reviewed and stable  Complications: No apparent anesthesia complications

## 2013-01-09 NOTE — Preoperative (Signed)
Beta Blockers   Reason not to administer Beta Blockers:Not Applicable 

## 2013-01-09 NOTE — H&P (Signed)
BP 139/88  Pulse 81  Temp(Src) 97.8 F (36.6 C) (Oral)  Resp 18  SpO2 98% HISTORY: [name] is a 42 year old gentleman who presents today for evaluation of pain that he has in his upper extremities along with numbness He is right-handed and has had this pain pretty much his whole life he says. 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 He then underwent radiation treatment. 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 On recent plain x-ray, the radiologist read this as multiple lumbar fractures. However on the CT, it was recognized that these were not compression fractures but were indeed more than likely vertebrae that grew irregularly.  PAST MEDICAL HISTORY: Also includes hypertension, congestive heart failure for which he has had a defibrillator implanted, hypercholesterolemia and arthritis. He has no bowel or bladder dysfunction. Currently he is not able to work. He says that the pain is something which has only gotten worse and continues to get worse. He has never had anything that really makes him feel better. He says that he has pain in his neck on the right side and into the right upper extremity also. He says he has numbness in the lower back and neck.  His only surgical procedure besides the Wilms tumor and kidney resection was the defibrillator placement. He reports that when he first awakens in the morning he is quite painful. He cannot lift anything heavy and he has to essentially get himself together before he can keep going. He has had no treatment that he is aware of for this. No treatment currently. He has had a bone density scan and was noted to have very low bone density.  ALLERGIES: He is allergic to Motrin. SOCIAL HISTORY: He does smoke a couple of packs a day I believe. He does drink alcohol socially. He says he does not have a history of substance abuse. He is 5'6" tall and 116 pounds measured today. REVIEW OF SYSTEMS:  Positive for back pain, leg pain, joint pain, arthritis, neck pain, anxiety and depression, broken bones in his ribs, tailbone and fingers. He denies constitutional, eyes, ears, nose, throat, mouth, cardiovascular, respiratory, GI, GU, skin, neurological, endocrine, hematologic and allergic problems.   CURRENT MEDICATIONS: Lasix 20 mg. q.d., Carvedilol 12.5 mg. q.d., Aspirin 81 mg. q.d., Simvastatin 20 mg. q.d., Vitamin D q.d., Vicodin 5/500 q.d., Multivitamins q.d., Calcium q.d. and Prozac 10 mg. q.d.  PHYSICAL EXAMINATION: On examination, he appears much older than his stated age. He is alert and oriented x four, answering all questions appropriately. He is cooperative. Memory, language, attention span and fund of knowledge are normal. He is well kempt but in moderate distress. Normal muscle tone, bulk and coordination. Gait is mildly antalgic. Reflexes are 2+ upper extremities, trace at the knees and ankles. Intact proprioception in both upper and lower extremities. Weakness in the right deltoid otherwise normal strength in the upper extremities. Pulses are good at the wrists bilaterally. No clubbing, cyanosis or edema. PERRL. Full EOM's and full visual fields. Hearing is intact to voice. Uvula elevates in the midline. Shoulder shrug is normal.  DIAGNOSTIC STUDIES:Cervical myelogram revealed no compressive problems. A subsuqent emg and ncv revealed severe carpal tunnel disease and an ulnar neuropathy bilaterally. DIAGNOSIS: Carpal tunnel syndrome, ulnar neuropathy Assessment/Plan:[name] was seen by me for evaluation of possible cervical problem. He had no cervical problems, but still has the numbness in his hand, right and left sides. He then underwent  an EMG at the behest of his family doctor and was found to have both carpal tunnel syndrome bilaterally and right ulnar neuropathy. IMPRESSION/PLAN: At this point in time, I think [name] probably just needs to go to the operating room for  decompression. We will do an ulnar nerve decompression and carpal tunnel decompression this coming Friday. Risks and benefits explained, bleeding, infection, no relief, damage to the median nerve, damage to the ulnar nerve, weakness on his hand, loss of sensation. He understands and wishes to proceed.

## 2013-01-09 NOTE — OR Nursing (Signed)
800 rep for [employer] contacted re chking /interrogating ICD before d/c

## 2013-01-09 NOTE — Anesthesia Procedure Notes (Signed)
Procedure Name: LMA Insertion Date/Time: 01/09/2013 3:00 PM Performed by: Romie Minus K Pre-anesthesia Checklist: Patient identified, Emergency Drugs available, Suction available, Patient being monitored and Timeout performed Patient Re-evaluated:Patient Re-evaluated prior to inductionOxygen Delivery Method: Circle system utilized Preoxygenation: Pre-oxygenation with 100% oxygen Intubation Type: IV induction Ventilation: Mask ventilation without difficulty LMA: LMA with gastric port inserted LMA Size: 4.0 Number of attempts: 1 Placement Confirmation: positive ETCO2,  CO2 detector and breath sounds checked- equal and bilateral Tube secured with: Tape Dental Injury: Teeth and Oropharynx as per pre-operative assessment

## 2013-01-10 ENCOUNTER — Other Ambulatory Visit: Payer: Self-pay | Admitting: Family Medicine

## 2013-01-12 NOTE — Telephone Encounter (Signed)
?   OK to Refill  

## 2013-01-12 NOTE — Telephone Encounter (Signed)
Pt just had carpal tunnel on 01/09/13.  Was given pain RX then.  #45 Hydrocodone 7.5/325

## 2013-01-13 ENCOUNTER — Encounter (HOSPITAL_COMMUNITY): Payer: Self-pay | Admitting: Neurosurgery

## 2013-01-13 NOTE — Telephone Encounter (Signed)
denied °

## 2013-01-14 ENCOUNTER — Ambulatory Visit (HOSPITAL_COMMUNITY)
Admission: RE | Admit: 2013-01-14 | Discharge: 2013-01-14 | Disposition: A | Discharge status: Home / Self Care | Payer: Medicaid Other | Source: Ambulatory Visit | Attending: Family Medicine | Admitting: Family Medicine

## 2013-01-14 DIAGNOSIS — I428 Other cardiomyopathies: Secondary | ICD-10-CM | POA: Insufficient documentation

## 2013-01-14 DIAGNOSIS — R7989 Other specified abnormal findings of blood chemistry: Secondary | ICD-10-CM | POA: Insufficient documentation

## 2013-01-14 DIAGNOSIS — I509 Heart failure, unspecified: Secondary | ICD-10-CM | POA: Insufficient documentation

## 2013-01-14 DIAGNOSIS — E785 Hyperlipidemia, unspecified: Secondary | ICD-10-CM | POA: Insufficient documentation

## 2013-01-20 ENCOUNTER — Other Ambulatory Visit: Payer: Self-pay | Admitting: Neurosurgery

## 2013-01-22 ENCOUNTER — Other Ambulatory Visit: Payer: Self-pay | Admitting: Family Medicine

## 2013-01-22 NOTE — Telephone Encounter (Signed)
?   OK to Refill  

## 2013-01-23 NOTE — Telephone Encounter (Signed)
Med refilled.

## 2013-01-23 NOTE — Telephone Encounter (Signed)
Okay to refill? 

## 2013-01-27 ENCOUNTER — Encounter (HOSPITAL_COMMUNITY): Payer: Self-pay | Admitting: Pharmacy Technician

## 2013-01-28 ENCOUNTER — Ambulatory Visit (HOSPITAL_COMMUNITY): Payer: Medicaid Other

## 2013-01-28 ENCOUNTER — Encounter (HOSPITAL_COMMUNITY): Payer: Self-pay | Admitting: *Deleted

## 2013-01-28 LAB — CBC
HCT: 44.9 % (ref 39.0–52.0)
Hemoglobin: 16.4 g/dL (ref 13.0–17.0)
MCV: 93 fL (ref 78.0–100.0)
RBC: 4.83 MIL/uL (ref 4.22–5.81)
WBC: 6.8 10*3/uL (ref 4.0–10.5)

## 2013-01-28 LAB — COMPREHENSIVE METABOLIC PANEL
Albumin: 3.8 g/dL (ref 3.5–5.2)
Alkaline Phosphatase: 80 U/L (ref 39–117)
BUN: 7 mg/dL (ref 6–23)
Creatinine, Ser: 0.85 mg/dL (ref 0.50–1.35)
Potassium: 4.4 mEq/L (ref 3.5–5.1)
Total Protein: 7.6 g/dL (ref 6.0–8.3)

## 2013-01-28 NOTE — Pre-Procedure Instructions (Signed)
[name]  01/28/2013   Your procedure is scheduled on:  August 22  Report to [facility] Stay Center at 05:30  Call this number if you have problems the morning of surgery: [phone]   Remember:   Do not eat food or drink liquids after midnight.   Take these medicines the morning of surgery with A SIP OF WATER: Carvedilol(Coreg),Prozac(Fluoxetine),and Pain Pill(if needed)   Do not wear jewelry  Do not wear lotions, powders, or colognes. You may wear deodorant.  Men may shave face and neck.  Do not bring valuables to the hospital.  [facility] is not responsible                   for any belongings or valuables.  Contacts, dentures or bridgework may not be worn into surgery.  Leave suitcase in the car. After surgery it may be brought to your room.  For patients admitted to the hospital, checkout time is 11:00 AM the day of  discharge.   Patients discharged the day of surgery will not be allowed to drive  home.    Special Instructions: Shower using CHG 2 nights before surgery and the night before surgery.  If you shower the day of surgery use CHG.  Use special wash - you have one bottle of CHG for all showers.  You should use approximately 1/3 of the bottle for each shower.   Please read over the following fact sheets that you were given: Pain Booklet, Coughing and Deep Breathing and Surgical Site Infection Prevention

## 2013-01-29 MED ORDER — CEFAZOLIN SODIUM-DEXTROSE 2-3 GM-% IV SOLR
2.0000 g | INTRAVENOUS | Status: AC
  Administered 2013-01-30: 2 g via INTRAVENOUS
  Filled 2013-01-29: qty 50

## 2013-01-29 NOTE — Progress Notes (Signed)
Anesthesia chart review:  Patient is a 42 year old male scheduled for a left CTR on 01/28/13 by Dr. Wynetta Emery.  History includes smoking, non-ischemic CM, CHF, [employer] ICD 05/2011, scolisosi, Wilm's tumor s/p nephrectomy, HLD, depression, ETOH use, elevated liver enzymes, elevated fasting glucose.  He underwent right CTR earlier this month.  Primary cardiologist is Dr. Dietrich Pates, EP is Dr. Ladona Ridgel.  EKG on 08/05/12 showed SR, combined atrial enlargement, non-specific T wave abnormality.  Echo on 08/26/12 showed: - Left ventricle: The cavity size was severely dilated. Wall thickness was normal. Systolic function was severely reduced. The estimated ejection fraction was in the range of 20% to 25%. Diffuse hypokinesis. - Aortic valve: Trivial regurgitation. - Mitral valve: Mild regurgitation. - Left atrium: The atrium was mildly dilated. - Atrial septum: No defect or patent foramen ovale was identified. - Pulmonic valve: Mild regurgitation. - Tricuspid valve: Mild regurgitation. - Pulmonary arteries: PA peak pressure: 49mm Hg (S).  Cardiac cath on 01/03/11 showed: 1. No angiographic evidence of coronary artery disease.  2. Normal right heart pressures.  3. Nonischemic cardiomyopathy with severe left ventricular systolic dysfunction.   CXR on 01/08/13 noted.  AICD generator is on his left chest.  Preoperative labs noted.  He tolerated recent right CTR.  If no acute changes then I would anticipate that he could proceed.  Velna Ochs [facility] Stay [facility]/Anesthesiology Phone [phone] 01/29/2013 12:05 PM

## 2013-01-29 NOTE — Progress Notes (Signed)
Spoke with [employer], reviewed order rec'd back from [employer]. He reports the magnet will beep /w each heartbeat, once the cautery is complete , remove the magnet. Call to A. Zelenak,PAC, reported the same.

## 2013-01-29 NOTE — Progress Notes (Signed)
Call to [employer], left msg. For them regarding this pt.'s surgery scheduled for 01/30/2013.

## 2013-01-30 ENCOUNTER — Ambulatory Visit (HOSPITAL_COMMUNITY): Payer: Medicaid Other | Admitting: Anesthesiology

## 2013-01-30 ENCOUNTER — Ambulatory Visit (HOSPITAL_COMMUNITY)
Admission: RE | Admit: 2013-01-30 | Discharge: 2013-01-30 | Disposition: A | Discharge status: Home / Self Care | Payer: Medicaid Other | Source: Ambulatory Visit | Attending: Neurosurgery | Admitting: Neurosurgery

## 2013-01-30 ENCOUNTER — Encounter (HOSPITAL_COMMUNITY)
Admission: RE | Disposition: A | Discharge status: Home / Self Care | Payer: Self-pay | Source: Ambulatory Visit | Attending: Neurosurgery

## 2013-01-30 ENCOUNTER — Encounter (HOSPITAL_COMMUNITY): Payer: Self-pay | Admitting: Vascular Surgery

## 2013-01-30 ENCOUNTER — Encounter (HOSPITAL_COMMUNITY): Payer: Self-pay | Admitting: *Deleted

## 2013-01-30 DIAGNOSIS — I428 Other cardiomyopathies: Secondary | ICD-10-CM | POA: Insufficient documentation

## 2013-01-30 DIAGNOSIS — F329 Major depressive disorder, single episode, unspecified: Secondary | ICD-10-CM | POA: Insufficient documentation

## 2013-01-30 DIAGNOSIS — Z7982 Long term (current) use of aspirin: Secondary | ICD-10-CM | POA: Insufficient documentation

## 2013-01-30 DIAGNOSIS — G5600 Carpal tunnel syndrome, unspecified upper limb: Secondary | ICD-10-CM | POA: Insufficient documentation

## 2013-01-30 DIAGNOSIS — I951 Orthostatic hypotension: Secondary | ICD-10-CM | POA: Insufficient documentation

## 2013-01-30 DIAGNOSIS — M129 Arthropathy, unspecified: Secondary | ICD-10-CM | POA: Insufficient documentation

## 2013-01-30 DIAGNOSIS — M4120 Other idiopathic scoliosis, site unspecified: Secondary | ICD-10-CM | POA: Insufficient documentation

## 2013-01-30 DIAGNOSIS — Z905 Acquired absence of kidney: Secondary | ICD-10-CM | POA: Insufficient documentation

## 2013-01-30 DIAGNOSIS — I509 Heart failure, unspecified: Secondary | ICD-10-CM | POA: Insufficient documentation

## 2013-01-30 DIAGNOSIS — Z886 Allergy status to analgesic agent status: Secondary | ICD-10-CM | POA: Insufficient documentation

## 2013-01-30 DIAGNOSIS — F17200 Nicotine dependence, unspecified, uncomplicated: Secondary | ICD-10-CM | POA: Insufficient documentation

## 2013-01-30 DIAGNOSIS — Z923 Personal history of irradiation: Secondary | ICD-10-CM | POA: Insufficient documentation

## 2013-01-30 DIAGNOSIS — Z85528 Personal history of other malignant neoplasm of kidney: Secondary | ICD-10-CM | POA: Insufficient documentation

## 2013-01-30 DIAGNOSIS — F3289 Other specified depressive episodes: Secondary | ICD-10-CM | POA: Insufficient documentation

## 2013-01-30 DIAGNOSIS — Z79899 Other long term (current) drug therapy: Secondary | ICD-10-CM | POA: Insufficient documentation

## 2013-01-30 DIAGNOSIS — F411 Generalized anxiety disorder: Secondary | ICD-10-CM | POA: Insufficient documentation

## 2013-01-30 HISTORY — PX: CARPAL TUNNEL RELEASE: SHX101

## 2013-01-30 SURGERY — CARPAL TUNNEL RELEASE
Anesthesia: General | Laterality: Left | Wound class: Clean

## 2013-01-30 MED ORDER — KETOROLAC TROMETHAMINE 30 MG/ML IJ SOLN: 15.0000 mg | Freq: Once | INTRAMUSCULAR | Status: DC | PRN

## 2013-01-30 MED ORDER — HYDROMORPHONE HCL PF 1 MG/ML IJ SOLN: 0.2500 mg | INTRAMUSCULAR | Status: DC | PRN

## 2013-01-30 MED ORDER — LACTATED RINGERS IV SOLN
INTRAVENOUS | Status: DC | PRN
  Administered 2013-01-30: 07:00:00 via INTRAVENOUS

## 2013-01-30 MED ORDER — 0.9 % SODIUM CHLORIDE (POUR BTL) OPTIME
TOPICAL | Status: DC | PRN
  Administered 2013-01-30: 1000 mL

## 2013-01-30 MED ORDER — ONDANSETRON HCL 4 MG/2ML IJ SOLN: 4.0000 mg | Freq: Once | INTRAMUSCULAR | Status: DC | PRN

## 2013-01-30 MED ORDER — GLYCOPYRROLATE 0.2 MG/ML IJ SOLN
INTRAMUSCULAR | Status: DC | PRN
  Administered 2013-01-30: .4 mg via INTRAVENOUS

## 2013-01-30 MED ORDER — LIDOCAINE HCL (CARDIAC) 20 MG/ML IV SOLN
INTRAVENOUS | Status: DC | PRN
  Administered 2013-01-30: 40 mg via INTRAVENOUS

## 2013-01-30 MED ORDER — HYDROCODONE-ACETAMINOPHEN 10-325 MG PO TABS: 2.0000 | ORAL_TABLET | ORAL | Status: DC | PRN

## 2013-01-30 MED ORDER — PROPOFOL 10 MG/ML IV BOLUS
INTRAVENOUS | Status: DC | PRN
  Administered 2013-01-30: 120 mg via INTRAVENOUS

## 2013-01-30 MED ORDER — ONDANSETRON HCL 4 MG/2ML IJ SOLN
INTRAMUSCULAR | Status: DC | PRN
  Administered 2013-01-30: 4 mg via INTRAVENOUS

## 2013-01-30 MED ORDER — FENTANYL CITRATE 0.05 MG/ML IJ SOLN
INTRAMUSCULAR | Status: DC | PRN
  Administered 2013-01-30 (×3): 50 ug via INTRAVENOUS

## 2013-01-30 MED ORDER — LIDOCAINE-EPINEPHRINE 0.5 %-1:200000 IJ SOLN
INTRAMUSCULAR | Status: DC | PRN
  Administered 2013-01-30: 6 mL

## 2013-01-30 MED ORDER — HYDROCODONE-ACETAMINOPHEN 5-325 MG PO TABS: 1.0000 | ORAL_TABLET | Freq: Four times a day (QID) | ORAL | Status: DC | PRN

## 2013-01-30 MED ORDER — HYDROCODONE-ACETAMINOPHEN 5-325 MG PO TABS
ORAL_TABLET | ORAL | Status: AC
  Administered 2013-01-30: 1
  Filled 2013-01-30: qty 1

## 2013-01-30 MED ORDER — MIDAZOLAM HCL 5 MG/5ML IJ SOLN
INTRAMUSCULAR | Status: DC | PRN
  Administered 2013-01-30: 2 mg via INTRAVENOUS

## 2013-01-30 SURGICAL SUPPLY — 57 items
BANDAGE ELASTIC 3 VELCRO ST LF (GAUZE/BANDAGES/DRESSINGS) IMPLANT
BANDAGE ELASTIC 4 VELCRO ST LF (GAUZE/BANDAGES/DRESSINGS) ×2 IMPLANT
BANDAGE GAUZE ELAST BULKY 4 IN (GAUZE/BANDAGES/DRESSINGS) ×2 IMPLANT
BLADE SURG 15 STRL LF DISP TIS (BLADE) ×1 IMPLANT
BLADE SURG 15 STRL SS (BLADE) ×1
CLOTH BEACON ORANGE TIMEOUT ST (SAFETY) ×2 IMPLANT
CORDS BIPOLAR (ELECTRODE) ×2 IMPLANT
DECANTER SPIKE VIAL GLASS SM (MISCELLANEOUS) ×2 IMPLANT
DERMABOND ADVANCED (GAUZE/BANDAGES/DRESSINGS) ×1
DERMABOND ADVANCED .7 DNX12 (GAUZE/BANDAGES/DRESSINGS) ×1 IMPLANT
DRAPE EXTREMITY T 121X128X90 (DRAPE) ×2 IMPLANT
DRESSING TELFA 8X3 (GAUZE/BANDAGES/DRESSINGS) ×2 IMPLANT
DURAPREP 26ML APPLICATOR (WOUND CARE) ×2 IMPLANT
GAUZE SPONGE 4X4 16PLY XRAY LF (GAUZE/BANDAGES/DRESSINGS) ×2 IMPLANT
GLOVE BIO SURGEON STRL SZ 6.5 (GLOVE) IMPLANT
GLOVE BIO SURGEON STRL SZ7 (GLOVE) IMPLANT
GLOVE BIO SURGEON STRL SZ7.5 (GLOVE) IMPLANT
GLOVE BIO SURGEON STRL SZ8 (GLOVE) IMPLANT
GLOVE BIO SURGEON STRL SZ8.5 (GLOVE) IMPLANT
GLOVE BIOGEL M 8.0 STRL (GLOVE) IMPLANT
GLOVE BIOGEL PI IND STRL 7.5 (GLOVE) ×1 IMPLANT
GLOVE BIOGEL PI INDICATOR 7.5 (GLOVE) ×1
GLOVE ECLIPSE 6.5 STRL STRAW (GLOVE) ×2 IMPLANT
GLOVE ECLIPSE 7.0 STRL STRAW (GLOVE) IMPLANT
GLOVE ECLIPSE 7.5 STRL STRAW (GLOVE) IMPLANT
GLOVE ECLIPSE 8.0 STRL XLNG CF (GLOVE) IMPLANT
GLOVE ECLIPSE 8.5 STRL (GLOVE) IMPLANT
GLOVE EXAM NITRILE LRG STRL (GLOVE) ×2 IMPLANT
GLOVE EXAM NITRILE MD LF STRL (GLOVE) IMPLANT
GLOVE EXAM NITRILE XL STR (GLOVE) IMPLANT
GLOVE EXAM NITRILE XS STR PU (GLOVE) IMPLANT
GLOVE INDICATOR 6.5 STRL GRN (GLOVE) IMPLANT
GLOVE INDICATOR 7.0 STRL GRN (GLOVE) IMPLANT
GLOVE INDICATOR 7.5 STRL GRN (GLOVE) ×2 IMPLANT
GLOVE INDICATOR 8.0 STRL GRN (GLOVE) IMPLANT
GLOVE INDICATOR 8.5 STRL (GLOVE) IMPLANT
GLOVE OPTIFIT SS 8.0 STRL (GLOVE) IMPLANT
GLOVE SURG SS PI 6.5 STRL IVOR (GLOVE) IMPLANT
GOWN BRE IMP SLV AUR LG STRL (GOWN DISPOSABLE) ×4 IMPLANT
GOWN BRE IMP SLV AUR XL STRL (GOWN DISPOSABLE) ×2 IMPLANT
GOWN STRL REIN 2XL LVL4 (GOWN DISPOSABLE) ×2 IMPLANT
KIT BASIN OR (CUSTOM PROCEDURE TRAY) ×2 IMPLANT
KIT ROOM TURNOVER OR (KITS) ×2 IMPLANT
NEEDLE HYPO 25X1 1.5 SAFETY (NEEDLE) ×2 IMPLANT
NS IRRIG 1000ML POUR BTL (IV SOLUTION) ×2 IMPLANT
PACK SURGICAL SETUP 50X90 (CUSTOM PROCEDURE TRAY) ×2 IMPLANT
PAD ARMBOARD 7.5X6 YLW CONV (MISCELLANEOUS) ×6 IMPLANT
SPONGE GAUZE 4X4 12PLY (GAUZE/BANDAGES/DRESSINGS) ×2 IMPLANT
STOCKINETTE 4X48 STRL (DRAPES) ×2 IMPLANT
SUT ETHILON 3 0 PS 1 (SUTURE) ×2 IMPLANT
SYR BULB 3OZ (MISCELLANEOUS) ×2 IMPLANT
SYR CONTROL 10ML LL (SYRINGE) ×2 IMPLANT
TOWEL OR 17X24 6PK STRL BLUE (TOWEL DISPOSABLE) ×2 IMPLANT
TOWEL OR 17X26 10 PK STRL BLUE (TOWEL DISPOSABLE) ×2 IMPLANT
TUBE CONNECTING 12X1/4 (SUCTIONS) ×2 IMPLANT
UNDERPAD 30X30 INCONTINENT (UNDERPADS AND DIAPERS) ×2 IMPLANT
WATER STERILE IRR 1000ML POUR (IV SOLUTION) ×2 IMPLANT

## 2013-01-30 NOTE — Anesthesia Postprocedure Evaluation (Signed)
  Anesthesia Post-op Note  Patient: [name]  Procedure(s) Performed: Procedure(s) with comments: LEFT CARPAL TUNNEL RELEASE (Left) - LEFT Carpal Tunnel release  Patient Location: PACU  Anesthesia Type:General  Level of Consciousness: awake, alert  and oriented  Airway and Oxygen Therapy: Patient Spontanous Breathing  Post-op Pain: Mild  Post-op Assessment: Post-op Vital signs reviewed, Patient's Cardiovascular Status Stable, Respiratory Function Stable, Patent Airway and Pain level controlled  Post-op Vital Signs: stable  Complications: No apparent anesthesia complications

## 2013-01-30 NOTE — H&P (Signed)
Richard Hurt, MD Physician Incomplete [name]) Neurosurgery H&P Service date: 01/30/2013 8:14 AM  BP 110/76  Pulse 60  Temp(Src) 98.4 F (36.9 C) (Oral)  Resp 18  SpO2 95% [name] is a 42 y.o. male With bilateral carpal tunnel disease, status post right carpal tunnel release and right ulnar nerve decompression. He is doing well. He is admitted for left carpal tunnel release. Allergies   Allergen  Reactions   .  Ibuprofen  Hives    Prior to Admission medications    Medication  Sig  Start Date  End Date  Taking?  Authorizing Provider   aspirin 81 MG tablet  Take 81 mg by mouth daily.        Yes  Historical Provider, MD   calcium-vitamin D (OSCAL WITH D) 500-200 MG-UNIT per tablet  Take 2 tablets by mouth every morning.       Yes  Historical Provider, MD   carvedilol (COREG) 12.5 MG tablet  Take 18.75 mg by mouth every morning.       Yes  Historical Provider, MD   FLUoxetine (PROZAC) 20 MG capsule  Take 20 mg by mouth every morning.      Yes  Historical Provider, MD   furosemide (LASIX) 20 MG tablet  Take 20 mg by mouth every morning.       Yes  Historical Provider, MD   Hydrocodone-Acetaminophen 7.5-300 MG TABS  Take 1 tablet by mouth every 6 (six) hours as needed (pain).  01/09/13    Yes  Richard Hurt, MD   Multiple Vitamins-Minerals (MULTIVITAMIN WITH MINERALS) tablet  Take 1 tablet by mouth every morning.       Yes  Historical Provider, MD       Past Medical History   Diagnosis  Date   .  Cardiomyopathy, nonischemic  10/2010       takes Carvedilol daily   .  Tobacco abuse         30 pack years   .  Orthostatic hypotension     .  Alcohol ingestion of one to four drinks per day         Equivalent of 4-5 ounces of liquor in beer   .  Scoliosis     .  Fasting hyperglycemia  06/20/2011   .  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  Hyperlipidemia         was on meds but taken off a month ago   .  CHF (congestive heart failure)         takes Lasix  daily   .  Weakness         and numbness to wrist and fingers;carpal tunnel syndrome   .  Arthritis     .  Chronic back pain         buldging disc-3 and radiation damage per pt   .  Depression         takes Prozac daily   .  Elevated liver enzymes      Past Surgical History   Procedure  Laterality  Date   .  Nephrectomy    55yrs ago       Wilms Tumor   .  Icd    05/25/2011       [employer] Endotak [employer] SG lead/Energen single chamber device   .  Mylogram    11/2012   .  Carpal  tunnel release  Right  01/09/2013       Procedure: CARPAL TUNNEL RELEASE;  Surgeon: Richard Hurt, MD;  Location: [facility];  Service: Neurosurgery;  Laterality: Right;  RIGHT carpal tunnel release   .  Ulnar nerve transposition  Right  01/09/2013       Procedure: ULNAR NERVE DECOMPRESSION/TRANSPOSITION;  Surgeon: Richard Hurt, MD;  Location: [facility];  Service: Neurosurgery;  Laterality: Right;  RIGHT ulnar nerve decompression    History       Social History   .  Marital Status:  Legally Separated       Spouse Name:  N/A       Number of Children:  1   .  Years of Education:  N/A       Occupational History   .  full time         Engineer, petroleum       Social History Main Topics   .  Smoking status:  Current Some Day Smoker -- 0.25 packs/day for 20 years   .  Smokeless tobacco:  Never Used   .  Alcohol Use:  Yes         Comment: OCCASIONAL   .  Drug Use:  No   .  Sexual Activity:  Not Currently       Other Topics  Concern   .  Not on file       Social History Narrative   .  No narrative on file    History reviewed. No pertinent family history. Physical Exam  Constitutional: He is oriented to person, place, and time. He appears well-developed and well-nourished.  HENT:   Head: Normocephalic and atraumatic.  Eyes: Conjunctivae and EOM are normal. Pupils are equal, round, and reactive to light.  Neck: Normal range of motion. Neck supple.  Cardiovascular: Normal rate,  regular rhythm and normal heart sounds.   Pulmonary/Chest: Effort normal and breath sounds normal.  Abdominal: Soft.  Musculoskeletal: Normal range of motion.  Neurological: He is alert and oriented to person, place, and time. He has normal reflexes. No cranial nerve deficit. He exhibits normal muscle tone. Coordination normal.  Skin: Skin is warm and dry.  Psychiatric: He has a normal mood and affect. His behavior is normal. Judgment and thought content normal.  Mr. Venuto hAs agreed to undergo a carpal tunnel release, risks including but not limited to bleeding, infection, nerve damage, weakness, and no improvement.

## 2013-01-30 NOTE — Preoperative (Signed)
Beta Blockers   Reason not to administer Beta Blockers:Coreg taken 01/30/13

## 2013-01-30 NOTE — Anesthesia Preprocedure Evaluation (Addendum)
Anesthesia Evaluation  Patient identified by MRN, date of birth, ID band Patient awake    Reviewed: Allergy & Precautions, H&P , NPO status , Patient's Chart, lab work & pertinent test results  Airway Mallampati: I      Dental  (+) Edentulous Upper and Edentulous Lower   Pulmonary  breath sounds clear to auscultation        Cardiovascular Rhythm:Regular Rate:Normal     Neuro/Psych    GI/Hepatic   Endo/Other    Renal/GU      Musculoskeletal   Abdominal   Peds  Hematology   Anesthesia Other Findings   Reproductive/Obstetrics                           Anesthesia Physical Anesthesia Plan  ASA: III  Anesthesia Plan: General   Post-op Pain Management:    Induction: Intravenous  Airway Management Planned: LMA  Additional Equipment:   Intra-op Plan:   Post-operative Plan:   Informed Consent: I have reviewed the patients History and Physical, chart, labs and discussed the procedure including the risks, benefits and alternatives for the proposed anesthesia with the patient or authorized representative who has indicated his/her understanding and acceptance.     Plan Discussed with: CRNA and Anesthesiologist  Anesthesia Plan Comments: (Nonischemic cardiomyopathy EF 20-25% by last Echo 08/26/12, normal coronaries by cath 01/03/11 [facility] Scientific AICD in place, will place magnet over device per Dr. Lubertha Basque instructions H/O Wilms tumor S/P nephrectomy and XRT age 42 Cr 0.85 H/O Etoh use Smoker  Plan GA with LMA  Kipp Brood, MD)      Anesthesia Quick Evaluation

## 2013-01-30 NOTE — Op Note (Signed)
01/30/2013  9:27 AM  PATIENT:  [name]  42 y.o. male  PRE-OPERATIVE DIAGNOSIS:  LEFT carpal tunnel syndrome  POST-OPERATIVE DIAGNOSIS:  LEFT carpal tunnel syndrome  PROCEDURE:  Procedure(s): LEFT CARPAL TUNNEL RELEASE  SURGEON:  Surgeon(s): Carmela Hurt, MD  ASSISTANTS:none  ANESTHESIA:   general  EBL:  Total I/O In: 500 [I.V.:500] Out: -   BLOOD ADMINISTERED:none  CELL SAVER GIVEN:none  COUNT:per nursing  DRAINS: none   SPECIMEN:  No Specimen  DICTATION: [name] was taken to the operating room, and placed under a general anesthetic via an LMA. His left upper extremity was prepped and draped in a sterile manner. I infiltrated lidocaine into the wrist and palm. I opened the incision with a 15 blade and carried it into the palm. I dissected sharply through the soft tissue to the transverse carpal ligament. I divided the ligament with the 15 blade initially. I then used scissors to divide the ligament proximally in the distal forearm and distally into the wrist. I decompressed the carpal tunnel and inspected with a blunt dissector. I was satisfied with the decompression. I irrigated then closed with interrupted vertical mattress sutures. I applied a sterile dressing.  PLAN OF CARE: Discharge to home after PACU  PATIENT DISPOSITION:  PACU - hemodynamically stable.   Delay start of Pharmacological VTE agent (>24hrs) due to surgical blood loss or risk of bleeding:  yes

## 2013-01-30 NOTE — Anesthesia Procedure Notes (Addendum)
Performed by: Cathie Olden B   Procedure Name: LMA Insertion Date/Time: 01/30/2013 7:53 AM Performed by: Sherie Don Pre-anesthesia Checklist: Patient identified, Emergency Drugs available, Suction available, Patient being monitored and Timeout performed Patient Re-evaluated:Patient Re-evaluated prior to inductionOxygen Delivery Method: Circle system utilized Preoxygenation: Pre-oxygenation with 100% oxygen Intubation Type: IV induction LMA: LMA inserted LMA Size: 5.0 Number of attempts: 1 Placement Confirmation: positive ETCO2 Tube secured with: Tape Dental Injury: Teeth and Oropharynx as per pre-operative assessment

## 2013-01-30 NOTE — Transfer of Care (Signed)
Immediate Anesthesia Transfer of Care Note  Patient: [name]  Procedure(s) Performed: Procedure(s) with comments: LEFT CARPAL TUNNEL RELEASE (Left) - LEFT Carpal Tunnel release  Patient Location: PACU  Anesthesia Type:General  Level of Consciousness: awake, alert  and patient cooperative  Airway & Oxygen Therapy: Patient Spontanous Breathing and Patient connected to nasal cannula oxygen  Post-op Assessment: Report given to PACU RN, Post -op Vital signs reviewed and stable and Patient moving all extremities X 4  Post vital signs: Reviewed and stable  Complications: No apparent anesthesia complications

## 2013-02-02 ENCOUNTER — Encounter (HOSPITAL_COMMUNITY): Payer: Self-pay | Admitting: Neurosurgery

## 2013-02-18 ENCOUNTER — Ambulatory Visit (INDEPENDENT_AMBULATORY_CARE_PROVIDER_SITE_OTHER): Payer: Medicaid Other | Admitting: *Deleted

## 2013-02-18 DIAGNOSIS — I420 Dilated cardiomyopathy: Secondary | ICD-10-CM

## 2013-02-18 DIAGNOSIS — I428 Other cardiomyopathies: Secondary | ICD-10-CM

## 2013-02-18 DIAGNOSIS — I5022 Chronic systolic (congestive) heart failure: Secondary | ICD-10-CM

## 2013-02-18 DIAGNOSIS — I509 Heart failure, unspecified: Secondary | ICD-10-CM

## 2013-02-18 LAB — ICD DEVICE OBSERVATION
DEVICE MODEL ICD: 118994
RV LEAD IMPEDENCE ICD: 592 Ohm
RV LEAD THRESHOLD: 0.8 V

## 2013-02-18 NOTE — Progress Notes (Signed)
ICD check in office. 

## 2013-02-24 ENCOUNTER — Other Ambulatory Visit: Payer: Self-pay | Admitting: Cardiology

## 2013-03-11 ENCOUNTER — Encounter: Payer: Self-pay | Admitting: Internal Medicine

## 2013-03-16 ENCOUNTER — Telehealth: Payer: Self-pay | Admitting: Family Medicine

## 2013-03-16 NOTE — Telephone Encounter (Signed)
Ok to refill 

## 2013-03-16 NOTE — Telephone Encounter (Signed)
He is on 7.5-325mg , 1 po BID #60, Refill zero , let him know about the new rules on hydrocodone

## 2013-03-17 MED ORDER — HYDROCODONE-ACETAMINOPHEN 7.5-325 MG PO TABS: 1.0000 | ORAL_TABLET | Freq: Two times a day (BID) | ORAL | Status: DC | PRN

## 2013-03-17 NOTE — Telephone Encounter (Signed)
Med printed and pt aware that needs to come to office to pick up RX

## 2013-04-08 ENCOUNTER — Telehealth: Payer: Self-pay

## 2013-04-08 MED ORDER — FUROSEMIDE 20 MG PO TABS: ORAL_TABLET | ORAL | Status: DC

## 2013-04-08 NOTE — Telephone Encounter (Signed)
Received fax refill request  Rx # [id number] Medication:  Lasix 20 mg Qty 30 Sig:  Take one tablet by mouth once daily Physician:  Purvis Sheffield

## 2013-04-08 NOTE — Telephone Encounter (Signed)
Refilled for #20 with 0 refills. Put comment on bottle to make an appointment with MD for further refills. Was put on last refill as well.

## 2013-04-21 ENCOUNTER — Telehealth: Payer: Self-pay | Admitting: *Deleted

## 2013-04-21 NOTE — Telephone Encounter (Signed)
Okay to refill? 

## 2013-04-21 NOTE — Telephone Encounter (Signed)
Hydrocodone 7.5-325mg  take one tablet po BID prn pain

## 2013-04-22 MED ORDER — HYDROCODONE-ACETAMINOPHEN 7.5-325 MG PO TABS: 1.0000 | ORAL_TABLET | Freq: Two times a day (BID) | ORAL | Status: DC | PRN

## 2013-04-22 NOTE — Telephone Encounter (Signed)
RX printed, ready for Dr. Lenox Ponds and pt to pickup

## 2013-04-29 ENCOUNTER — Telehealth: Payer: Self-pay | Admitting: Family Medicine

## 2013-05-18 ENCOUNTER — Ambulatory Visit (INDEPENDENT_AMBULATORY_CARE_PROVIDER_SITE_OTHER): Payer: Medicaid Other | Admitting: Internal Medicine

## 2013-05-18 ENCOUNTER — Encounter: Payer: Self-pay | Admitting: Internal Medicine

## 2013-05-18 VITALS — BP 95/61 | HR 89 | Ht 69.0 in | Wt 123.0 lb

## 2013-05-18 DIAGNOSIS — I5022 Chronic systolic (congestive) heart failure: Secondary | ICD-10-CM

## 2013-05-18 DIAGNOSIS — F17200 Nicotine dependence, unspecified, uncomplicated: Secondary | ICD-10-CM

## 2013-05-18 DIAGNOSIS — I420 Dilated cardiomyopathy: Secondary | ICD-10-CM

## 2013-05-18 DIAGNOSIS — Z720 Tobacco use: Secondary | ICD-10-CM

## 2013-05-18 DIAGNOSIS — I428 Other cardiomyopathies: Secondary | ICD-10-CM

## 2013-05-18 DIAGNOSIS — I509 Heart failure, unspecified: Secondary | ICD-10-CM

## 2013-05-18 LAB — MDC_IDC_ENUM_SESS_TYPE_INCLINIC
HighPow Impedance: 67 Ohm
HighPow Impedance: 93 Ohm
Implantable Pulse Generator Serial Number: 118994
Lead Channel Pacing Threshold Amplitude: 0.7 V
Lead Channel Sensing Intrinsic Amplitude: 25 mV
Lead Channel Setting Pacing Amplitude: 2.4 V
Lead Channel Setting Pacing Pulse Width: 0.4 ms
Lead Channel Setting Sensing Sensitivity: 0.4 mV

## 2013-05-18 MED ORDER — FUROSEMIDE 20 MG PO TABS: ORAL_TABLET | ORAL | Status: DC

## 2013-05-18 NOTE — Assessment & Plan Note (Signed)
His symptoms remain class 2. Will continue his current meds. He is encouraged to maintain a low sodium diet.

## 2013-05-18 NOTE — Patient Instructions (Addendum)
Your physician recommends that you schedule a follow-up appointment in: 1 year  With Dr Ladona Ridgel and 3 months with Alisia Ferrari will receive a reminder letter two months in advance reminding you to call and schedule your appointment. If you don't receive this letter, please contact our office.

## 2013-05-18 NOTE — Assessment & Plan Note (Signed)
His Boston scientific device is working normally. He has approx. 12 years of longevity.

## 2013-05-18 NOTE — Assessment & Plan Note (Signed)
He is encouraged to stop smoking. 

## 2013-05-18 NOTE — Progress Notes (Signed)
HPI Mr. Sites returns today for followup. He is a very pleasant 42 year old man with a nonischemic cardiomyopathy, chronic systolic heart failure, status post ICD implantation. He has been bothersome with musculoskeletal chest pain. He has kyphoscoliosis. He received extensive radiation when he was young for treatment of a Wilms tumor. The patient denies syncope, chest pain, or peripheral edema. Despite his multiple problems, he remains active. He has class 2A symptoms. He has been unable to take an ACE inhibitor or an ARB due to hypotension. Allergies  Allergen Reactions  . Ibuprofen Hives     Current Outpatient Prescriptions  Medication Sig Dispense Refill  . aspirin 81 MG tablet Take 81 mg by mouth daily.        . calcium-vitamin D (OSCAL WITH D) 500-200 MG-UNIT per tablet Take 2 tablets by mouth every morning.       . carvedilol (COREG) 12.5 MG tablet Take 18.75 mg by mouth every morning.       Marland Kitchen FLUoxetine (PROZAC) 20 MG capsule Take 20 mg by mouth every morning.      . furosemide (LASIX) 20 MG tablet TAKE ONE TABLET BY MOUTH ONCE DAILY  20 tablet  0  . HYDROcodone-acetaminophen (NORCO) 7.5-325 MG per tablet Take 1 tablet by mouth 2 (two) times daily as needed.  60 tablet  0  . Multiple Vitamins-Minerals (MULTIVITAMIN WITH MINERALS) tablet Take 1 tablet by mouth every morning.        No current facility-administered medications for this visit.     Past Medical History  Diagnosis Date  . Cardiomyopathy, nonischemic 10/2010    takes Carvedilol daily  . Tobacco abuse     30 pack years  . Orthostatic hypotension   . Alcohol ingestion of one to four drinks per day     Equivalent of 4-5 ounces of liquor in beer  . Scoliosis   . Fasting hyperglycemia 06/20/2011  . Wilm's tumor     nephrectomy-age 76/ Radiation and Chemo  . Hyperlipidemia     was on meds but taken off a month ago  . CHF (congestive heart failure)     takes Lasix daily  . Weakness     and numbness to wrist and  fingers;carpal tunnel syndrome  . Arthritis   . Chronic back pain     buldging disc-3 and radiation damage per pt  . Depression     takes Prozac daily  . Elevated liver enzymes     ROS:   All systems reviewed and negative except as noted in the HPI.   Past Surgical History  Procedure Laterality Date  . Nephrectomy  25yrs ago    Wilms Tumor  . Icd  05/25/2011    [employer] Endotak [employer] SG lead/Energen single chamber device  . Mylogram  11/2012  . Carpal tunnel release Right 01/09/2013    Procedure: CARPAL TUNNEL RELEASE;  Surgeon: Carmela Hurt, MD;  Location: [facility];  Service: Neurosurgery;  Laterality: Right;  RIGHT carpal tunnel release  . Ulnar nerve transposition Right 01/09/2013    Procedure: ULNAR NERVE DECOMPRESSION/TRANSPOSITION;  Surgeon: Carmela Hurt, MD;  Location: [facility];  Service: Neurosurgery;  Laterality: Right;  RIGHT ulnar nerve decompression  . Carpal tunnel release Left 01/30/2013    Procedure: LEFT CARPAL TUNNEL RELEASE;  Surgeon: Carmela Hurt, MD;  Location: [facility];  Service: Neurosurgery;  Laterality: Left;  LEFT Carpal Tunnel release     No family history on file.   History  Social History  . Marital Status: Legally Separated    Spouse Name: N/A    Number of Children: 1  . Years of Education: N/A   Occupational History  . full time     Engineer, petroleum   Social History Main Topics  . Smoking status: Current Some Day Smoker -- 0.25 packs/day for 20 years  . Smokeless tobacco: Never Used  . Alcohol Use: Yes     Comment: OCCASIONAL  . Drug Use: No  . Sexual Activity: Not Currently   Other Topics Concern  . Not on file   Social History Narrative  . No narrative on file     BP 95/61  Pulse 89  Ht 5\' 9"  (1.753 m)  Wt 123 lb (55.792 kg)  BMI 18.16 kg/m2  Physical Exam:  Well appearing 42 year old man, NAD HEENT: Unremarkable Neck:  No JVD, no thyromegally Back:  No CVA tenderness, marked  kyphoscoliosis. Lungs:  Clear with no wheezes, rales, or rhonchi. Well-healed ICD incision. HEART:  Regular rate rhythm, no murmurs, no rubs, no clicks Abd:  soft, scaphoid, positive bowel sounds, no organomegally, no rebound, no guarding Ext:  2 plus pulses, no edema, no cyanosis, no clubbing Skin:  No rashes no nodules Neuro:  CN II through XII intact, motor grossly intact  DEVICE  Normal device function.  See PaceArt for details.   Assess/Plan:

## 2013-05-19 ENCOUNTER — Encounter: Payer: Self-pay | Admitting: Internal Medicine

## 2013-05-20 ENCOUNTER — Other Ambulatory Visit: Payer: Self-pay | Admitting: Family Medicine

## 2013-05-27 ENCOUNTER — Telehealth: Payer: Self-pay | Admitting: *Deleted

## 2013-05-27 NOTE — Telephone Encounter (Signed)
?   Ok to refill, last refill on hydrocodone 04/22/13, last ov 11/12/2012

## 2013-05-27 NOTE — Telephone Encounter (Signed)
Okay to refill for this month, pt needs OV before any further refills

## 2013-05-29 MED ORDER — HYDROCODONE-ACETAMINOPHEN 7.5-325 MG PO TABS: 1.0000 | ORAL_TABLET | Freq: Two times a day (BID) | ORAL | Status: DC | PRN

## 2013-05-29 NOTE — Telephone Encounter (Signed)
Med filled, ready for dr.approval

## 2013-06-22 ENCOUNTER — Other Ambulatory Visit: Payer: Self-pay | Admitting: Family Medicine

## 2013-06-24 ENCOUNTER — Other Ambulatory Visit: Payer: Self-pay | Admitting: Family Medicine

## 2013-06-25 ENCOUNTER — Telehealth: Payer: Self-pay | Admitting: Internal Medicine

## 2013-06-25 NOTE — Telephone Encounter (Signed)
Noted medication is a psychiatric medication and Dr. Cristopher Peru does not mange this medication, this nurse spoke to pharmacist Dois Davenport to advise, she noted mistake per was meant to go to pt PCP

## 2013-06-25 NOTE — Telephone Encounter (Signed)
Received fax refill request  Rx # [id number] Medication:  Prozac 20 mg cap Qty 30 Sig:  Take one capsule by mouth daily Physician:  Lovena Le

## 2013-06-26 ENCOUNTER — Telehealth: Payer: Self-pay | Admitting: Family Medicine

## 2013-06-26 ENCOUNTER — Other Ambulatory Visit: Payer: Self-pay | Admitting: Family Medicine

## 2013-06-26 MED ORDER — HYDROCODONE-ACETAMINOPHEN 7.5-325 MG PO TABS: 1.0000 | ORAL_TABLET | Freq: Two times a day (BID) | ORAL | Status: DC | PRN

## 2013-06-26 NOTE — Telephone Encounter (Signed)
Okay to refill, let pt know he can not get any further until he comes in for appt, he is overdue

## 2013-06-26 NOTE — Telephone Encounter (Signed)
Med printed ready for dr. approval

## 2013-06-26 NOTE — Telephone Encounter (Signed)
Pt is needing his Norco refilled Call back number is [phone]

## 2013-06-26 NOTE — Telephone Encounter (Signed)
?   Ok to refill;last refill 05/29/13;last ov 11/12/12

## 2013-07-04 ENCOUNTER — Other Ambulatory Visit: Payer: Self-pay | Admitting: Family Medicine

## 2013-07-06 ENCOUNTER — Telehealth: Payer: Self-pay | Admitting: Adult Health

## 2013-07-06 DIAGNOSIS — I5022 Chronic systolic (congestive) heart failure: Secondary | ICD-10-CM

## 2013-07-06 MED ORDER — FUROSEMIDE 20 MG PO TABS
ORAL_TABLET | ORAL | Status: DC
Start: 2013-07-06 — End: 2013-08-26

## 2013-07-06 NOTE — Telephone Encounter (Signed)
Received fax refill request  Rx # [id number] Medication:  Lasix 20 mg tab Qty 20 Sig:  Take one tablet by mouth once daily Physician:  Purcell Nails  Patient is out of medication / tgs

## 2013-07-06 NOTE — Telephone Encounter (Signed)
Medication sent via escribe.  

## 2013-07-29 ENCOUNTER — Telehealth: Payer: Self-pay | Admitting: Family Medicine

## 2013-07-29 NOTE — Telephone Encounter (Signed)
Needs appt first?

## 2013-07-29 NOTE — Telephone Encounter (Signed)
Call back number is [phone] Pt is needing a refill on HYDROcodone-acetaminophen (NORCO) 7.5-325 MG per tablet

## 2013-07-29 NOTE — Telephone Encounter (Signed)
?   Ok to refill, last refill 06/26/13, last ov 11/12/12

## 2013-07-29 NOTE — Telephone Encounter (Signed)
Has appt on Tues Feb24th t 245

## 2013-08-04 ENCOUNTER — Ambulatory Visit: Payer: Medicaid Other | Admitting: Family Medicine

## 2013-08-17 ENCOUNTER — Telehealth: Payer: Self-pay

## 2013-08-17 ENCOUNTER — Telehealth: Payer: Self-pay | Admitting: Adult Health

## 2013-08-17 NOTE — Telephone Encounter (Signed)
Thanks, no other recommendations

## 2013-08-17 NOTE — Telephone Encounter (Signed)
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

## 2013-08-17 NOTE — Telephone Encounter (Signed)
Received call from  with the following:  Swelling:  Duration?2-3 days  Where is the swelling? feet  Is there pain? no  Is it worsening or about the same? same  Is it associated with shortness of breath? no  Are you experiencing fluid retention? no  Call status: phone note sent to nurse appt given for 3/10 w/ NP  -Instruction note:  If patient states that swelling is worse, having pain in area of swelling or shortness of breath, transfer to nurse.

## 2013-08-18 ENCOUNTER — Ambulatory Visit: Payer: Medicaid Other | Admitting: Adult Health

## 2013-08-21 ENCOUNTER — Encounter: Payer: Self-pay | Admitting: Internal Medicine

## 2013-08-21 ENCOUNTER — Ambulatory Visit (INDEPENDENT_AMBULATORY_CARE_PROVIDER_SITE_OTHER): Payer: Medicare Other | Admitting: *Deleted

## 2013-08-21 DIAGNOSIS — I428 Other cardiomyopathies: Secondary | ICD-10-CM

## 2013-08-21 DIAGNOSIS — I420 Dilated cardiomyopathy: Secondary | ICD-10-CM

## 2013-08-21 DIAGNOSIS — I5022 Chronic systolic (congestive) heart failure: Secondary | ICD-10-CM

## 2013-08-21 DIAGNOSIS — I509 Heart failure, unspecified: Secondary | ICD-10-CM

## 2013-08-21 LAB — MDC_IDC_ENUM_SESS_TYPE_INCLINIC
HighPow Impedance: 73 Ohm
Implantable Pulse Generator Serial Number: 118994
Lead Channel Impedance Value: 532 Ohm
Lead Channel Pacing Threshold Pulse Width: 0.4 ms
Lead Channel Sensing Intrinsic Amplitude: 15.4 mV
Lead Channel Setting Pacing Pulse Width: 0.4 ms
MDC IDC MSMT BATTERY REMAINING LONGEVITY: 144 mo
MDC IDC MSMT LEADCHNL RV PACING THRESHOLD AMPLITUDE: 0.7 V
MDC IDC SET LEADCHNL RV PACING AMPLITUDE: 2.4 V
MDC IDC SET LEADCHNL RV SENSING SENSITIVITY: 0.4 mV
MDC IDC SET ZONE DETECTION INTERVAL: 250 ms
MDC IDC SET ZONE DETECTION INTERVAL: 316 ms
MDC IDC STAT BRADY RV PERCENT PACED: 1 %

## 2013-08-21 NOTE — Progress Notes (Signed)
ICD check in office. 

## 2013-08-24 ENCOUNTER — Ambulatory Visit (INDEPENDENT_AMBULATORY_CARE_PROVIDER_SITE_OTHER): Payer: Medicaid Other | Admitting: Family Medicine

## 2013-08-24 ENCOUNTER — Telehealth: Payer: Self-pay | Admitting: *Deleted

## 2013-08-24 ENCOUNTER — Encounter (HOSPITAL_COMMUNITY): Payer: Self-pay | Admitting: Emergency Medicine

## 2013-08-24 ENCOUNTER — Ambulatory Visit (HOSPITAL_COMMUNITY)
Admission: RE | Admit: 2013-08-24 | Discharge: 2013-08-24 | Disposition: A | Discharge status: Home / Self Care | Payer: Medicare Other | Source: Ambulatory Visit | Attending: Family Medicine | Admitting: Family Medicine

## 2013-08-24 ENCOUNTER — Inpatient Hospital Stay (HOSPITAL_COMMUNITY): Payer: Medicare Other

## 2013-08-24 ENCOUNTER — Other Ambulatory Visit: Payer: Self-pay

## 2013-08-24 ENCOUNTER — Inpatient Hospital Stay (HOSPITAL_COMMUNITY)
Admission: EM | Admit: 2013-08-24 | Discharge: 2013-08-26 | DRG: 291 | Disposition: A | Discharge status: Home Health | Payer: Medicare Other | Attending: Family Medicine | Admitting: Family Medicine

## 2013-08-24 ENCOUNTER — Encounter: Payer: Self-pay | Admitting: Family Medicine

## 2013-08-24 ENCOUNTER — Emergency Department (HOSPITAL_COMMUNITY): Payer: Medicare Other

## 2013-08-24 VITALS — BP 136/72 | HR 74 | Temp 98.4°F | Resp 14 | Ht 66.0 in | Wt 127.0 lb

## 2013-08-24 DIAGNOSIS — R079 Chest pain, unspecified: Secondary | ICD-10-CM | POA: Insufficient documentation

## 2013-08-24 DIAGNOSIS — I498 Other specified cardiac arrhythmias: Secondary | ICD-10-CM | POA: Diagnosis present

## 2013-08-24 DIAGNOSIS — I509 Heart failure, unspecified: Secondary | ICD-10-CM | POA: Diagnosis present

## 2013-08-24 DIAGNOSIS — F17200 Nicotine dependence, unspecified, uncomplicated: Secondary | ICD-10-CM

## 2013-08-24 DIAGNOSIS — Z79899 Other long term (current) drug therapy: Secondary | ICD-10-CM

## 2013-08-24 DIAGNOSIS — F32A Depression, unspecified: Secondary | ICD-10-CM

## 2013-08-24 DIAGNOSIS — Z7982 Long term (current) use of aspirin: Secondary | ICD-10-CM | POA: Diagnosis not present

## 2013-08-24 DIAGNOSIS — G8929 Other chronic pain: Secondary | ICD-10-CM | POA: Diagnosis present

## 2013-08-24 DIAGNOSIS — I2589 Other forms of chronic ischemic heart disease: Secondary | ICD-10-CM | POA: Diagnosis present

## 2013-08-24 DIAGNOSIS — F3289 Other specified depressive episodes: Secondary | ICD-10-CM

## 2013-08-24 DIAGNOSIS — I5022 Chronic systolic (congestive) heart failure: Secondary | ICD-10-CM

## 2013-08-24 DIAGNOSIS — Z905 Acquired absence of kidney: Secondary | ICD-10-CM | POA: Diagnosis not present

## 2013-08-24 DIAGNOSIS — K219 Gastro-esophageal reflux disease without esophagitis: Secondary | ICD-10-CM | POA: Diagnosis present

## 2013-08-24 DIAGNOSIS — R05 Cough: Secondary | ICD-10-CM

## 2013-08-24 DIAGNOSIS — Z95810 Presence of automatic (implantable) cardiac defibrillator: Secondary | ICD-10-CM

## 2013-08-24 DIAGNOSIS — R0602 Shortness of breath: Secondary | ICD-10-CM | POA: Diagnosis present

## 2013-08-24 DIAGNOSIS — F329 Major depressive disorder, single episode, unspecified: Secondary | ICD-10-CM

## 2013-08-24 DIAGNOSIS — M129 Arthropathy, unspecified: Secondary | ICD-10-CM | POA: Diagnosis present

## 2013-08-24 DIAGNOSIS — J189 Pneumonia, unspecified organism: Secondary | ICD-10-CM | POA: Diagnosis present

## 2013-08-24 DIAGNOSIS — R0789 Other chest pain: Secondary | ICD-10-CM

## 2013-08-24 DIAGNOSIS — Z720 Tobacco use: Secondary | ICD-10-CM | POA: Diagnosis present

## 2013-08-24 DIAGNOSIS — E785 Hyperlipidemia, unspecified: Secondary | ICD-10-CM | POA: Diagnosis present

## 2013-08-24 DIAGNOSIS — R059 Cough, unspecified: Secondary | ICD-10-CM

## 2013-08-24 DIAGNOSIS — R0781 Pleurodynia: Secondary | ICD-10-CM

## 2013-08-24 DIAGNOSIS — M549 Dorsalgia, unspecified: Secondary | ICD-10-CM | POA: Diagnosis present

## 2013-08-24 DIAGNOSIS — Z85528 Personal history of other malignant neoplasm of kidney: Secondary | ICD-10-CM

## 2013-08-24 DIAGNOSIS — E46 Unspecified protein-calorie malnutrition: Secondary | ICD-10-CM | POA: Diagnosis present

## 2013-08-24 DIAGNOSIS — J90 Pleural effusion, not elsewhere classified: Secondary | ICD-10-CM | POA: Diagnosis present

## 2013-08-24 DIAGNOSIS — I5023 Acute on chronic systolic (congestive) heart failure: Secondary | ICD-10-CM | POA: Diagnosis present

## 2013-08-24 DIAGNOSIS — I420 Dilated cardiomyopathy: Secondary | ICD-10-CM

## 2013-08-24 DIAGNOSIS — E876 Hypokalemia: Secondary | ICD-10-CM | POA: Diagnosis present

## 2013-08-24 DIAGNOSIS — M4120 Other idiopathic scoliosis, site unspecified: Secondary | ICD-10-CM | POA: Diagnosis present

## 2013-08-24 DIAGNOSIS — J984 Other disorders of lung: Secondary | ICD-10-CM

## 2013-08-24 DIAGNOSIS — J9600 Acute respiratory failure, unspecified whether with hypoxia or hypercapnia: Secondary | ICD-10-CM | POA: Diagnosis present

## 2013-08-24 DIAGNOSIS — I2789 Other specified pulmonary heart diseases: Secondary | ICD-10-CM | POA: Diagnosis present

## 2013-08-24 DIAGNOSIS — R7309 Other abnormal glucose: Secondary | ICD-10-CM

## 2013-08-24 DIAGNOSIS — R7302 Impaired glucose tolerance (oral): Secondary | ICD-10-CM

## 2013-08-24 HISTORY — DX: Chronic systolic (congestive) heart failure: I50.22

## 2013-08-24 HISTORY — DX: Other specified health status: Z78.9

## 2013-08-24 LAB — COMPREHENSIVE METABOLIC PANEL
ALK PHOS: 118 U/L — AB (ref 39–117)
ALT: 18 U/L (ref 0–53)
AST: 19 U/L (ref 0–37)
Albumin: 3.1 g/dL — ABNORMAL LOW (ref 3.5–5.2)
BILIRUBIN TOTAL: 1.1 mg/dL (ref 0.2–1.2)
BUN: 7 mg/dL (ref 6–23)
CO2: 29 mEq/L (ref 19–32)
Calcium: 8.7 mg/dL (ref 8.4–10.5)
Chloride: 92 mEq/L — ABNORMAL LOW (ref 96–112)
Creat: 0.72 mg/dL (ref 0.50–1.35)
Glucose, Bld: 140 mg/dL — ABNORMAL HIGH (ref 70–99)
Potassium: 4 mEq/L (ref 3.5–5.3)
SODIUM: 131 meq/L — AB (ref 135–145)
TOTAL PROTEIN: 6.2 g/dL (ref 6.0–8.3)

## 2013-08-24 LAB — BASIC METABOLIC PANEL
BUN: 7 mg/dL (ref 6–23)
CO2: 30 meq/L (ref 19–32)
Calcium: 9.1 mg/dL (ref 8.4–10.5)
Chloride: 92 mEq/L — ABNORMAL LOW (ref 96–112)
Creatinine, Ser: 0.67 mg/dL (ref 0.50–1.35)
GFR calc Af Amer: >90 mL/min (ref >90)
GLUCOSE: 140 mg/dL — AB (ref 70–99)
POTASSIUM: 3.6 meq/L — AB (ref 3.7–5.3)
SODIUM: 133 meq/L — AB (ref 137–147)

## 2013-08-24 LAB — CBC WITH DIFFERENTIAL/PLATELET
BASOS PCT: 1 % (ref 0–1)
Basophils Absolute: 0.1 10*3/uL (ref 0.0–0.1)
Basophils Absolute: 0 10*3/uL (ref 0.0–0.1)
Basophils Relative: 0 % (ref 0–1)
EOS ABS: 0.1 10*3/uL (ref 0.0–0.7)
Eosinophils Absolute: 0.1 10*3/uL (ref 0.0–0.7)
Eosinophils Relative: 1 % (ref 0–5)
Eosinophils Relative: 1 % (ref 0–5)
HCT: 41.8 % (ref 39.0–52.0)
HCT: 43.5 % (ref 39.0–52.0)
HEMOGLOBIN: 14.7 g/dL (ref 13.0–17.0)
Hemoglobin: 14.5 g/dL (ref 13.0–17.0)
LYMPHS ABS: 0.8 10*3/uL (ref 0.7–4.0)
LYMPHS ABS: 0.9 10*3/uL (ref 0.7–4.0)
Lymphocytes Relative: 10 % — ABNORMAL LOW (ref 12–46)
Lymphocytes Relative: 10 % — ABNORMAL LOW (ref 12–46)
MCH: 32.7 pg (ref 26.0–34.0)
MCH: 33 pg (ref 26.0–34.0)
MCHC: 34.7 g/dL (ref 30.0–36.0)
MCHC: 33.8 g/dL (ref 30.0–36.0)
MCV: 94.1 fL (ref 78.0–100.0)
MCV: 97.5 fL (ref 78.0–100.0)
MONOS PCT: 16 % — AB (ref 3–12)
Monocytes Absolute: 1.2 10*3/uL — ABNORMAL HIGH (ref 0.1–1.0)
Monocytes Absolute: 1.4 10*3/uL — ABNORMAL HIGH (ref 0.1–1.0)
Monocytes Relative: 15 % — ABNORMAL HIGH (ref 3–12)
NEUTROS ABS: 5.8 10*3/uL (ref 1.7–7.7)
NEUTROS PCT: 73 % (ref 43–77)
NEUTROS PCT: 73 % (ref 43–77)
Neutro Abs: 6.3 10*3/uL (ref 1.7–7.7)
PLATELETS: 452 10*3/uL — AB (ref 150–400)
Platelets: 382 10*3/uL (ref 150–400)
RBC: 4.44 MIL/uL (ref 4.22–5.81)
RBC: 4.46 MIL/uL (ref 4.22–5.81)
RDW: 14.1 % (ref 11.5–15.5)
RDW: 13.9 % (ref 11.5–15.5)
WBC: 8 10*3/uL (ref 4.0–10.5)
WBC: 8.7 10*3/uL (ref 4.0–10.5)

## 2013-08-24 LAB — LIPID PANEL
Cholesterol: 129 mg/dL (ref 0–200)
HDL: 25 mg/dL — ABNORMAL LOW (ref >39)
LDL CALC: 88 mg/dL (ref 0–99)
Total CHOL/HDL Ratio: 5.2 Ratio
Triglycerides: 82 mg/dL (ref <150)
VLDL: 16 mg/dL (ref 0–40)

## 2013-08-24 LAB — APTT: aPTT: 32 seconds (ref 24–37)

## 2013-08-24 LAB — HEPATIC FUNCTION PANEL
ALBUMIN: 2.8 g/dL — AB (ref 3.5–5.2)
ALK PHOS: 132 U/L — AB (ref 39–117)
ALT: 18 U/L (ref 0–53)
AST: 25 U/L (ref 0–37)
BILIRUBIN TOTAL: 1 mg/dL (ref 0.3–1.2)
Bilirubin, Direct: 0.4 mg/dL — ABNORMAL HIGH (ref 0.0–0.3)
Indirect Bilirubin: 0.6 mg/dL (ref 0.3–0.9)
Total Protein: 7.2 g/dL (ref 6.0–8.3)

## 2013-08-24 LAB — PROTIME-INR
INR: 1.23 (ref 0.00–1.49)
PROTHROMBIN TIME: 15.2 s (ref 11.6–15.2)

## 2013-08-24 LAB — TROPONIN I: Troponin I: <0.3 ng/mL (ref <0.30)

## 2013-08-24 LAB — PRO B NATRIURETIC PEPTIDE: Pro B Natriuretic peptide (BNP): 6856 pg/mL — ABNORMAL HIGH (ref 0–125)

## 2013-08-24 LAB — HEMOGLOBIN A1C
HEMOGLOBIN A1C: 5.7 % — AB (ref <5.7)
MEAN PLASMA GLUCOSE: 117 mg/dL — AB (ref <117)

## 2013-08-24 MED ORDER — FLUOXETINE HCL 20 MG PO CAPS
20.0000 mg | ORAL_CAPSULE | Freq: Every morning | ORAL | Status: DC
  Administered 2013-08-25 – 2013-08-26 (×2): 20 mg via ORAL
  Filled 2013-08-24 (×2): qty 1

## 2013-08-24 MED ORDER — ENOXAPARIN SODIUM 40 MG/0.4ML ~~LOC~~ SOLN
40.0000 mg | SUBCUTANEOUS | Status: DC
  Administered 2013-08-25: 40 mg via SUBCUTANEOUS
  Filled 2013-08-24: qty 0.4

## 2013-08-24 MED ORDER — FLUOXETINE HCL 20 MG PO CAPS: 20.0000 mg | ORAL_CAPSULE | Freq: Every morning | ORAL | Status: DC

## 2013-08-24 MED ORDER — OMEPRAZOLE 40 MG PO CPDR: 40.0000 mg | DELAYED_RELEASE_CAPSULE | Freq: Every day | ORAL | Status: DC

## 2013-08-24 MED ORDER — ACETAMINOPHEN 650 MG RE SUPP: 650.0000 mg | Freq: Four times a day (QID) | RECTAL | Status: DC | PRN

## 2013-08-24 MED ORDER — PANTOPRAZOLE SODIUM 40 MG PO TBEC
80.0000 mg | DELAYED_RELEASE_TABLET | Freq: Every day | ORAL | Status: DC
  Administered 2013-08-24 – 2013-08-26 (×3): 80 mg via ORAL
  Filled 2013-08-24 (×3): qty 2

## 2013-08-24 MED ORDER — AZITHROMYCIN 500 MG IV SOLR: 500.0000 mg | INTRAVENOUS | Status: DC

## 2013-08-24 MED ORDER — CARVEDILOL 12.5 MG PO TABS
18.7500 mg | ORAL_TABLET | Freq: Every day | ORAL | Status: DC
  Filled 2013-08-24: qty 2

## 2013-08-24 MED ORDER — DEXTROSE 5 % IV SOLN
INTRAVENOUS | Status: AC
  Filled 2013-08-24: qty 500

## 2013-08-24 MED ORDER — CARVEDILOL 12.5 MG PO TABS
18.7500 mg | ORAL_TABLET | Freq: Every day | ORAL | Status: DC
  Administered 2013-08-24 – 2013-08-26 (×3): 18.75 mg via ORAL
  Filled 2013-08-24 (×2): qty 2

## 2013-08-24 MED ORDER — ASPIRIN 81 MG PO CHEW
81.0000 mg | CHEWABLE_TABLET | Freq: Every day | ORAL | Status: DC
  Administered 2013-08-24 – 2013-08-25 (×2): 81 mg via ORAL
  Filled 2013-08-24 (×2): qty 1

## 2013-08-24 MED ORDER — SODIUM CHLORIDE 0.9 % IV SOLN
250.0000 mL | INTRAVENOUS | Status: DC | PRN
Start: 2013-08-24 — End: 2013-08-26
  Administered 2013-08-25: 250 mL via INTRAVENOUS

## 2013-08-24 MED ORDER — CARVEDILOL 12.5 MG PO TABS: 18.7500 mg | ORAL_TABLET | Freq: Every morning | ORAL | Status: DC

## 2013-08-24 MED ORDER — CALCIUM CARBONATE-VITAMIN D 500-200 MG-UNIT PO TABS
2.0000 | ORAL_TABLET | Freq: Every morning | ORAL | Status: DC
  Administered 2013-08-25 – 2013-08-26 (×2): 2 via ORAL
  Filled 2013-08-24 (×2): qty 2

## 2013-08-24 MED ORDER — SODIUM CHLORIDE 0.9 % IJ SOLN
3.0000 mL | INTRAMUSCULAR | Status: DC | PRN
  Administered 2013-08-24: 3 mL via INTRAVENOUS

## 2013-08-24 MED ORDER — DEXTROSE 5 % IV SOLN: 1.0000 g | INTRAVENOUS | Status: DC

## 2013-08-24 MED ORDER — POTASSIUM CHLORIDE CRYS ER 20 MEQ PO TBCR
40.0000 meq | EXTENDED_RELEASE_TABLET | Freq: Two times a day (BID) | ORAL | Status: AC
  Administered 2013-08-24 – 2013-08-25 (×2): 40 meq via ORAL
  Filled 2013-08-24 (×2): qty 2

## 2013-08-24 MED ORDER — ADULT MULTIVITAMIN W/MINERALS CH
1.0000 | ORAL_TABLET | Freq: Every morning | ORAL | Status: DC
  Administered 2013-08-25 – 2013-08-26 (×2): 1 via ORAL
  Filled 2013-08-24 (×2): qty 1

## 2013-08-24 MED ORDER — ACETAMINOPHEN 325 MG PO TABS: 650.0000 mg | ORAL_TABLET | Freq: Four times a day (QID) | ORAL | Status: DC | PRN

## 2013-08-24 MED ORDER — DEXTROSE 5 % IV SOLN
500.0000 mg | INTRAVENOUS | Status: DC
  Administered 2013-08-25: 500 mg via INTRAVENOUS
  Filled 2013-08-24 (×3): qty 500

## 2013-08-24 MED ORDER — MORPHINE SULFATE 2 MG/ML IJ SOLN
1.0000 mg | INTRAMUSCULAR | Status: DC | PRN
  Administered 2013-08-25: 1 mg via INTRAVENOUS
  Filled 2013-08-24: qty 1

## 2013-08-24 MED ORDER — HYDROCODONE-ACETAMINOPHEN 7.5-325 MG PO TABS: 1.0000 | ORAL_TABLET | Freq: Two times a day (BID) | ORAL | Status: DC | PRN

## 2013-08-24 MED ORDER — SODIUM CHLORIDE 0.9 % IJ SOLN
3.0000 mL | Freq: Two times a day (BID) | INTRAMUSCULAR | Status: DC
  Administered 2013-08-24 – 2013-08-25 (×3): 3 mL via INTRAVENOUS

## 2013-08-24 MED ORDER — DEXTROSE 5 % IV SOLN
INTRAVENOUS | Status: AC
  Filled 2013-08-24: qty 10

## 2013-08-24 MED ORDER — HYDROCODONE-ACETAMINOPHEN 7.5-325 MG PO TABS
1.0000 | ORAL_TABLET | Freq: Four times a day (QID) | ORAL | Status: DC | PRN
  Administered 2013-08-24: 1 via ORAL
  Filled 2013-08-24: qty 1

## 2013-08-24 MED ORDER — DEXTROSE 5 % IV SOLN
1.0000 g | INTRAVENOUS | Status: DC
  Administered 2013-08-24 – 2013-08-25 (×2): 1 g via INTRAVENOUS
  Filled 2013-08-24 (×3): qty 10

## 2013-08-24 MED ORDER — ONDANSETRON HCL 4 MG PO TABS: 4.0000 mg | ORAL_TABLET | Freq: Four times a day (QID) | ORAL | Status: DC | PRN

## 2013-08-24 MED ORDER — FUROSEMIDE 10 MG/ML IJ SOLN: 20.0000 mg | Freq: Two times a day (BID) | INTRAMUSCULAR | Status: DC

## 2013-08-24 MED ORDER — ONDANSETRON HCL 4 MG/2ML IJ SOLN: 4.0000 mg | Freq: Four times a day (QID) | INTRAMUSCULAR | Status: DC | PRN

## 2013-08-24 MED ORDER — FUROSEMIDE 10 MG/ML IJ SOLN
20.0000 mg | Freq: Two times a day (BID) | INTRAMUSCULAR | Status: DC
  Administered 2013-08-24 – 2013-08-25 (×3): 20 mg via INTRAVENOUS
  Filled 2013-08-24 (×4): qty 2

## 2013-08-24 MED ORDER — IOHEXOL 300 MG/ML  SOLN
80.0000 mL | Freq: Once | INTRAMUSCULAR | Status: AC | PRN
  Administered 2013-08-24: 80 mL via INTRAVENOUS

## 2013-08-24 NOTE — Telephone Encounter (Signed)
Received call from [employer], tech with [facility].   Reported that no fx noted to CXR, but large pleural effusion noted to R side.   MD made aware and advised that patient be sent to ER.   Rollene Fare made aware.

## 2013-08-24 NOTE — Assessment & Plan Note (Signed)
Re-start Prozac 

## 2013-08-24 NOTE — Assessment & Plan Note (Signed)
Continues to smoke, with his cough, obtain CXR

## 2013-08-24 NOTE — ED Provider Notes (Signed)
CSN: [id number]     Arrival date & time 08/24/13  1210 History   First MD Initiated Contact with Patient 08/24/13 1309     Chief Complaint  Patient presents with  . Pleural Effusion     (Consider location/radiation/quality/duration/timing/severity/associated sxs/prior Treatment) Patient is a 43 y.o. male presenting with shortness of breath. The history is provided by the patient (the pt became sob for 2 days).  Shortness of Breath Severity:  Moderate Onset quality:  Gradual Timing:  Constant Progression:  Unchanged Chronicity:  New Context: not activity   Associated symptoms: no abdominal pain, no chest pain, no cough, no headaches and no rash     Past Medical History  Diagnosis Date  . Cardiomyopathy, nonischemic 10/2010    takes Carvedilol daily  . Tobacco abuse     30 pack years  . Orthostatic hypotension   . Alcohol ingestion of one to four drinks per day     Equivalent of 4-5 ounces of liquor in beer  . Scoliosis   . Fasting hyperglycemia 06/20/2011  . Wilm's tumor     nephrectomy-age 42/ Radiation and Chemo  . Hyperlipidemia     was on meds but taken off a month ago  . CHF (congestive heart failure)     takes Lasix daily  . Weakness     and numbness to wrist and fingers;carpal tunnel syndrome  . Arthritis   . Chronic back pain     buldging disc-3 and radiation damage per pt  . Depression     takes Prozac daily  . Elevated liver enzymes    Past Surgical History  Procedure Laterality Date  . Nephrectomy  66yrs ago    Wilms Tumor  . Icd  05/25/2011    [employer] SG lead/[employer] single chamber device  . Mylogram  11/2012  . Carpal tunnel release Right 01/09/2013    Procedure: CARPAL TUNNEL RELEASE;  Surgeon: Winfield Cunas, MD;  Location: [facility];  Service: Neurosurgery;  Laterality: Right;  RIGHT carpal tunnel release  . Ulnar nerve transposition Right 01/09/2013    Procedure: ULNAR NERVE DECOMPRESSION/TRANSPOSITION;  Surgeon: Winfield Cunas, MD;  Location: [facility];  Service: Neurosurgery;  Laterality: Right;  RIGHT ulnar nerve decompression  . Carpal tunnel release Left 01/30/2013    Procedure: LEFT CARPAL TUNNEL RELEASE;  Surgeon: Winfield Cunas, MD;  Location: [facility];  Service: Neurosurgery;  Laterality: Left;  LEFT Carpal Tunnel release   History reviewed. No pertinent family history. History  Substance Use Topics  . Smoking status: Current Some Day Smoker -- 0.25 packs/day for 20 years  . Smokeless tobacco: Never Used  . Alcohol Use: Yes     Comment: OCCASIONAL    Review of Systems  Constitutional: Negative for appetite change and fatigue.  HENT: Negative for congestion, ear discharge and sinus pressure.   Eyes: Negative for discharge.  Respiratory: Positive for shortness of breath. Negative for cough.   Cardiovascular: Negative for chest pain.  Gastrointestinal: Negative for abdominal pain and diarrhea.  Genitourinary: Negative for frequency and hematuria.  Musculoskeletal: Negative for back pain.  Skin: Negative for rash.  Neurological: Negative for seizures and headaches.  Psychiatric/Behavioral: Negative for hallucinations.      Allergies  Ibuprofen  Home Medications   Current Outpatient Rx  Name  Route  Sig  Dispense  Refill  . aspirin 81 MG tablet   Oral   Take 81 mg by mouth daily.           Marland Kitchen  calcium-vitamin D (OSCAL WITH D) 500-200 MG-UNIT per tablet   Oral   Take 2 tablets by mouth every morning.          . carvedilol (COREG) 12.5 MG tablet   Oral   Take 1.5 tablets (18.75 mg total) by mouth every morning.   45 tablet   6   . FLUoxetine (PROZAC) 20 MG capsule   Oral   Take 1 capsule (20 mg total) by mouth every morning.   30 capsule   6   . furosemide (LASIX) 20 MG tablet      TAKE ONE TABLET BY MOUTH ONCE DAILY   30 tablet   3   . HYDROcodone-acetaminophen (NORCO) 7.5-325 MG per tablet   Oral   Take 1 tablet by mouth 2 (two) times daily as needed.   60  tablet   0   . Multiple Vitamins-Minerals (MULTIVITAMIN WITH MINERALS) tablet   Oral   Take 1 tablet by mouth every morning.          Marland Kitchen omeprazole (PRILOSEC) 40 MG capsule   Oral   Take 1 capsule (40 mg total) by mouth daily. FOR STOMACH   30 capsule   3    BP 118/75  Pulse 119  Temp(Src) 98.3 F (36.8 C) (Oral)  Resp 25  Ht 5\' 9"  (1.753 m)  Wt 127 lb (57.607 kg)  BMI 18.75 kg/m2  SpO2 93% Physical Exam  Constitutional: He is oriented to person, place, and time. He appears well-developed.  HENT:  Head: Normocephalic.  Eyes: Conjunctivae and EOM are normal. No scleral icterus.  Neck: Neck supple. No thyromegaly present.  Cardiovascular: Normal rate.  Exam reveals no gallop and no friction rub.   No murmur heard. Sinus tach  Pulmonary/Chest: No stridor. He has no wheezes. He has no rales. He exhibits no tenderness.  Decreased breath sounds right  Abdominal: He exhibits no distension. There is no tenderness. There is no rebound.  Musculoskeletal: Normal range of motion. He exhibits no edema.  Lymphadenopathy:    He has no cervical adenopathy.  Neurological: He is oriented to person, place, and time. He exhibits normal muscle tone. Coordination normal.  Skin: No rash noted. No erythema.  Psychiatric: He has a normal mood and affect. His behavior is normal.    ED Course  Procedures (including critical care time) Labs Review Labs Reviewed  BASIC METABOLIC PANEL - Abnormal; Notable for the following:    Sodium 133 (*)    Potassium 3.6 (*)    Chloride 92 (*)    Glucose, Bld 140 (*)    All other components within normal limits  CBC WITH DIFFERENTIAL - Abnormal; Notable for the following:    Lymphocytes Relative 10 (*)    Monocytes Relative 16 (*)    Monocytes Absolute 1.4 (*)    All other components within normal limits  PRO B NATRIURETIC PEPTIDE - Abnormal; Notable for the following:    Pro B Natriuretic peptide (BNP) 6856.0 (*)    All other components within  normal limits  HEPATIC FUNCTION PANEL - Abnormal; Notable for the following:    Albumin 2.8 (*)    Alkaline Phosphatase 132 (*)    Bilirubin, Direct 0.4 (*)    All other components within normal limits  TROPONIN I  PROTIME-INR  APTT   Imaging Review Dg Chest 2 View  08/24/2013   ADDENDUM REPORT: 08/24/2013 12:02  ADDENDUM: The first sentence of the Impression #1. Should state: Diffuse increased density within  the right hemi thorax with a component appearing to track along the lateral superior aspect of the right chest wall has the appearance of a moderate to large right pleural effusion.   Electronically Signed   By: Margaree Mackintosh M.D.   On: 08/24/2013 12:02   08/24/2013   CLINICAL DATA:  cOUGH, RIGHT RIB PAIN  EXAM: CHEST  2 VIEW  COMPARISON:  DG RIBS UNILATERAL*R* dated 08/24/2013; DG CHEST 2 VIEW dated 01/08/2013  FINDINGS: The cardiac silhouette is enlarged. A left chest wall AICD is identified lead tip projecting in the region the right ventricle. There is diffuse increased density within the right hemi thorax with a meniscal apex tracking along the superior lateral aspect of the right chest wall. This finding has the appearance of a moderate to large pleural effusion. There is increased density within the medial base of the aerated portion of the right upper lobe. Mild prominence of the interstitial markings identified. No further focal regions of consolidation or focal infiltrates are appreciated. The osseous structures are unremarkable.  IMPRESSION: 1. Diffuse increased density within the right hemi thorax appears to track along the lateral superior aspect of the right chest wall says the appearance of a right pleural effusion. Underlying component of atelectasis versus infiltrate cannot be excluded. Underlying mass or nodule also cannot be excluded. Likely compressive atelectasis within the medial aspect of the aerated portion of the right lung. 2. Mild interstitial prominence may reflect a  component of pulmonary vascular congestion. 3. No further focal regions of consolidation no focal infiltrates are appreciated. 4. These results will be called to the ordering clinician or representative by the Radiologist Assistant, and communication documented in the PACS Dashboard.  Electronically Signed: By: Margaree Mackintosh M.D. On: 08/24/2013 11:48   Dg Ribs Unilateral Right  08/24/2013   CLINICAL DATA:  RIB PAIN, COUGH  Is  EXAM: RIGHT RIBS - 2 VIEW  COMPARISON:  None.  FINDINGS: No fracture or other bone lesions are seen involving the ribs. There is diffuse increased density within the right hemi thorax described on recent chest radiograph.  IMPRESSION: No evidence of acute osseous abnormalities.   Electronically Signed   By: Margaree Mackintosh M.D.   On: 08/24/2013 11:49   Ct Chest W Contrast  08/24/2013   CLINICAL DATA:  Shortness of breath. Scoliosis. History of Wilms tumor.  EXAM: CT CHEST WITH CONTRAST  TECHNIQUE: Multidetector CT imaging of the chest was performed during intravenous contrast administration.  CONTRAST:  38mL OMNIPAQUE IOHEXOL 300 MG/ML  SOLN  COMPARISON:  Chest radiographs 08/24/2013. Lumbar spine CT 08/29/2012. Mild height loss involving the T12-L3 vertebral bodies is unchanged.  FINDINGS: No enlarged axillary lymph nodes are noted. Small paratracheal and precarinal lymph nodes measure up to 8 mm in short axis. Subcarinal lymph nodes measure up to 1.1 cm in short axis. A left-sided single lead pacemaker is present with tip in the left ventricular apex. The heart is mildly enlarged.  There is a large right pleural effusion with associated compressive atelectasis resulting in near complete right middle and lower lobe atelectasis. There is a small left pleural effusion. Patchy ground-glass opacities are present in the left upper lobe.  The visualized upper portion of the abdomen demonstrate sequelae of prior left nephrectomy. Hypoplastic T12-L3 vertebral bodies are unchanged.  IMPRESSION:  1. Large right pleural effusion with marked compressive atelectasis of the right middle and lower lobes. 2. Small left pleural effusion. 3. Small mediastinal lymph nodes, nonspecific. 4. Patchy left upper  lobe ground-glass opacities, nonspecific. Query infection.   Electronically Signed   By: Logan Bores   On: 08/24/2013 15:31     EKG Interpretation None      MDM   Final diagnoses:  Pleural effusion        Maudry Diego, MD 08/24/13 1622

## 2013-08-24 NOTE — Assessment & Plan Note (Signed)
Check FLP, no statin drug currently

## 2013-08-24 NOTE — Assessment & Plan Note (Addendum)
Check CMET, CBC He is at baseline weight, no JVD

## 2013-08-24 NOTE — ED Notes (Signed)
Pt sent from radiology depart, pt has a pleural effusion, rt side, significant per radiologist. Sent for further evaluation. Pt states he has felt sob since Wednesday.

## 2013-08-24 NOTE — Patient Instructions (Signed)
Restart medications Stomach pill given Pain meds refilled We will call with labs unless normal Get the xray  F/U 4 months

## 2013-08-24 NOTE — Progress Notes (Signed)
Patient ID: [name], male   DOB: 1970-11-08, 43 y.o.   MRN: [id number]     Subjective:    Patient ID: [name], male    DOB: [birth year], 43 y.o.   MRN: [id number]  Patient presents for Medication review and pain to R rib  Patient here to followup medications. He does complain of pain on the right side of his chest as well as a cough. He states that he was helping his family member move about a week and half ago and had some soreness he was getting coughing mostly on the couch one week ago and noticed the pain felt like a pulled muscle but he is concerned that he cracked a rib. His cough is nonproductive. He's not had any difficulty breathing with the exception of the cough. He's not had any chest pain. He did have some swelling in his feet about a week ago but this resolved. I did note his last visit with cardiology.  Also requests a refill on his chronic pain medication which he is using for his back as well as carpal tunnel, at this point nothing else can be done for his severe kyphoscoliosis.  Acid reflux-he states that he belches a lot and also gets a burning sensation in his chest after he eats. He would like to try a medication for acid reflux  Depression-history of depression as well as some anger issues with his young age and his cardiac problems. He's also still awaiting disability. He was doing well on the Prozac and has been off for a couple months but wants to restart the medication   Review Of Systems:  GEN- denies fatigue, fever, weight loss,weakness, recent illness HEENT- denies eye drainage, change in vision, nasal discharge, CVS- denies chest pain, palpitations RESP- denies SOB, +cough, wheeze ABD- denies N/V, change in stools, abd pain GU- denies dysuria, hematuria, dribbling, incontinence MSK- denies joint pain,+ muscle aches, injury Neuro- denies headache, dizziness, syncope, seizure activity       Objective:    BP 136/72  Pulse 74  Temp(Src) 98.4 F  (36.9 C)  Resp 14  Ht 5\' 6"  (1.676 m)  Wt 127 lb (57.607 kg)  BMI 20.51 kg/m2 GEN- NAD, alert and oriented x3, thin male HEENT- PERRL, EOMI, non injected sclera, pink conjunctiva, MMM, oropharynx clear Neck- Supple, no JVD CVS- RRR, no murmur RESP- Few scattered wheeze, decreased air BS right side,  Chest wall- TTP lower thoracic region, right side, no ecchymosis,  ABD-NABS,soft,NT,ND EXT- No edema Pulses- Radial 2+ Psych- normal affect and mood       Assessment & Plan:      Problem List Items Addressed This Visit   Rib pain on right side     Xray of chest to be done, possible MSK pain from coughing, no brusing, to suggest any diplaced fracture    Relevant Orders      DG Chest 2 View (Completed)      DG Ribs Unilateral Right (Completed)   Other and unspecified hyperlipidemia     Check FLP, no statin drug currently    Relevant Medications      carvedilol (COREG) tablet   Other Relevant Orders      Lipid panel   Glucose intolerance (impaired glucose tolerance) - Primary     Check A1C    Relevant Orders      Hemoglobin N4O   Chronic systolic congestive heart failure     Check CMET, CBC    Relevant  Orders      CBC with Differential      Comprehensive metabolic panel    Other Visit Diagnoses   Cough        Relevant Orders       DG Chest 2 View (Completed)    Encounter for long-term (current) use of other high-risk medications        Relevant Orders       Drug Screen, Urine       Note: This dictation was prepared with Dragon dictation along with smaller phrase technology. Any transcriptional errors that result from this process are unintentional.

## 2013-08-24 NOTE — Assessment & Plan Note (Signed)
Xray of chest to be done, possible MSK pain from coughing, no brusing, to suggest any diplaced fracture

## 2013-08-24 NOTE — Addendum Note (Signed)
Addended by: Sheral Flow on: 08/24/2013 04:43 PM   Modules accepted: Orders

## 2013-08-24 NOTE — Assessment & Plan Note (Signed)
Check A1C 

## 2013-08-24 NOTE — H&P (Signed)
[facility] History and Physical  [name] [id number] DOB: 12-Dec-1970 DOA: 08/24/2013  Referring physician: Dr Roderic Palau.  PCP: [name], MD   Chief Complaint: SOB.   HPI: [name] is a 43 y.o. male with PMH significant for Non Ischemic Cardiomyopathy EF 25 % by ECHO 2014, S/P ICD 2012 who was refer to radiology department for Chest X ray by PCP. Patient was found to have large Right side pleural effusion. He went to see his PCP because of chest pain and dyspnea on exertion that started last week. He relates pleuritic chest pain, pain with cough and deep breath. He also notice worsening dyspnea over last few days worse today. He relates productive cough. He denies fever, chills, weight loss. He denies chest pain on exertion, weight gain, nausea, vomiting or abdominal pain.   He has been taking his lasix every day.    Review of Systems:  Negative except as per HPI.   Past Medical History  Diagnosis Date  . Cardiomyopathy, nonischemic 10/2010    takes Carvedilol daily  . Tobacco abuse     30 pack years  . Orthostatic hypotension   . Alcohol ingestion of one to four drinks per day     Equivalent of 4-5 ounces of liquor in beer  . Scoliosis   . Fasting hyperglycemia 06/20/2011  . Wilm's tumor     nephrectomy-age 73/ Radiation and Chemo  . Hyperlipidemia     was on meds but taken off a month ago  . CHF (congestive heart failure)     takes Lasix daily  . Weakness     and numbness to wrist and fingers;carpal tunnel syndrome  . Arthritis   . Chronic back pain     buldging disc-3 and radiation damage per pt  . Depression     takes Prozac daily  . Elevated liver enzymes    Past Surgical History  Procedure Laterality Date  . Nephrectomy  77yrs ago    Wilms Tumor  . Icd  05/25/2011    [employer] SG lead/[employer] single chamber device  . Mylogram  11/2012  . Carpal tunnel release Right 01/09/2013    Procedure: CARPAL TUNNEL RELEASE;   Surgeon: Winfield Cunas, MD;  Location: [facility];  Service: Neurosurgery;  Laterality: Right;  RIGHT carpal tunnel release  . Ulnar nerve transposition Right 01/09/2013    Procedure: ULNAR NERVE DECOMPRESSION/TRANSPOSITION;  Surgeon: Winfield Cunas, MD;  Location: [facility];  Service: Neurosurgery;  Laterality: Right;  RIGHT ulnar nerve decompression  . Carpal tunnel release Left 01/30/2013    Procedure: LEFT CARPAL TUNNEL RELEASE;  Surgeon: Winfield Cunas, MD;  Location: [facility];  Service: Neurosurgery;  Laterality: Left;  LEFT Carpal Tunnel release   Social History:  reports that he has been smoking.  He has never used smokeless tobacco. He reports that he drinks alcohol. He reports that he does not use illicit drugs.  Allergies  Allergen Reactions  . Ibuprofen Hives   Family History; Father and Mother Healthy.   Prior to Admission medications   Medication Sig Start Date End Date Taking? Authorizing Provider  aspirin 81 MG tablet Take 81 mg by mouth daily.     Yes Historical Provider, MD  calcium-vitamin D (OSCAL WITH D) 500-200 MG-UNIT per tablet Take 2 tablets by mouth every morning.    Yes Historical Provider, MD  carvedilol (COREG) 12.5 MG tablet Take 1.5 tablets (18.75 mg total) by mouth every  morning. 08/24/13  Yes Alycia Rossetti, MD  FLUoxetine (PROZAC) 20 MG capsule Take 1 capsule (20 mg total) by mouth every morning. 08/24/13  Yes Alycia Rossetti, MD  furosemide (LASIX) 20 MG tablet TAKE ONE TABLET BY MOUTH ONCE DAILY 07/06/13  Yes Evans Lance, MD  HYDROcodone-acetaminophen (NORCO) 7.5-325 MG per tablet Take 1 tablet by mouth 2 (two) times daily as needed. 08/24/13  Yes Alycia Rossetti, MD  Multiple Vitamins-Minerals (MULTIVITAMIN WITH MINERALS) tablet Take 1 tablet by mouth every morning.    Yes Historical Provider, MD  omeprazole (PRILOSEC) 40 MG capsule Take 1 capsule (40 mg total) by mouth daily. FOR STOMACH 08/24/13   Alycia Rossetti, MD   Physical Exam: Filed  Vitals:   08/24/13 1630  BP:   Pulse: 124  Temp:   Resp: 25    BP 118/75  Pulse 124  Temp(Src) 98.3 F (36.8 C) (Oral)  Resp 25  Ht 5\' 9"  (1.753 m)  Wt 57.607 kg (127 lb)  BMI 18.75 kg/m2  SpO2 94%  General:  Appears calm and comfortable Eyes: PERRL, normal lids, irises & conjunctiva ENT: grossly normal hearing, lips & tongue Neck: no LAD, masses or thyromegaly Cardiovascular: RRR, no m/r/g. No LE edema. Telemetry: SR, no arrhythmias  Respiratory: Bilateral crackles, no wheezes, Normal respiratory effort. Abdomen: soft, ntnd Skin: no rash or induration seen on limited exam Musculoskeletal: grossly normal tone BUE/BLE Psychiatric: grossly normal mood and affect, speech fluent and appropriate Neurologic: grossly non-focal.          Labs on Admission:  Basic Metabolic Panel:  Recent Labs Lab 08/24/13 1250  NA 133*  K 3.6*  CL 92*  CO2 30  GLUCOSE 140*  BUN 7  CREATININE 0.67  CALCIUM 9.1   Liver Function Tests:  Recent Labs Lab 08/24/13 1250  AST 25  ALT 18  ALKPHOS 132*  BILITOT 1.0  PROT 7.2  ALBUMIN 2.8*   No results found for this basename: LIPASE, AMYLASE,  in the last 168 hours No results found for this basename: AMMONIA,  in the last 168 hours CBC:  Recent Labs Lab 08/24/13 1250  WBC 8.7  NEUTROABS 6.3  HGB 14.7  HCT 43.5  MCV 97.5  PLT 382   Cardiac Enzymes:  Recent Labs Lab 08/24/13 1250  TROPONINI <0.30    BNP (last 3 results)  Recent Labs  08/24/13 1250  PROBNP 6856.0*   CBG: No results found for this basename: GLUCAP,  in the last 168 hours  Radiological Exams on Admission: Dg Chest 2 View  08/24/2013   ADDENDUM REPORT: 08/24/2013 12:02  ADDENDUM: The first sentence of the Impression #1. Should state: Diffuse increased density within the right hemi thorax with a component appearing to track along the lateral superior aspect of the right chest wall has the appearance of a moderate to large right pleural effusion.    Electronically Signed   By: Margaree Mackintosh M.D.   On: 08/24/2013 12:02   08/24/2013   CLINICAL DATA:  cOUGH, RIGHT RIB PAIN  EXAM: CHEST  2 VIEW  COMPARISON:  DG RIBS UNILATERAL*R* dated 08/24/2013; DG CHEST 2 VIEW dated 01/08/2013  FINDINGS: The cardiac silhouette is enlarged. A left chest wall AICD is identified lead tip projecting in the region the right ventricle. There is diffuse increased density within the right hemi thorax with a meniscal apex tracking along the superior lateral aspect of the right chest wall. This finding has the appearance of a moderate  to large pleural effusion. There is increased density within the medial base of the aerated portion of the right upper lobe. Mild prominence of the interstitial markings identified. No further focal regions of consolidation or focal infiltrates are appreciated. The osseous structures are unremarkable.  IMPRESSION: 1. Diffuse increased density within the right hemi thorax appears to track along the lateral superior aspect of the right chest wall says the appearance of a right pleural effusion. Underlying component of atelectasis versus infiltrate cannot be excluded. Underlying mass or nodule also cannot be excluded. Likely compressive atelectasis within the medial aspect of the aerated portion of the right lung. 2. Mild interstitial prominence may reflect a component of pulmonary vascular congestion. 3. No further focal regions of consolidation no focal infiltrates are appreciated. 4. These results will be called to the ordering clinician or representative by the Radiologist Assistant, and communication documented in the PACS Dashboard.  Electronically Signed: By: Margaree Mackintosh M.D. On: 08/24/2013 11:48   Dg Ribs Unilateral Right  08/24/2013   CLINICAL DATA:  RIB PAIN, COUGH  Is  EXAM: RIGHT RIBS - 2 VIEW  COMPARISON:  None.  FINDINGS: No fracture or other bone lesions are seen involving the ribs. There is diffuse increased density within the right hemi  thorax described on recent chest radiograph.  IMPRESSION: No evidence of acute osseous abnormalities.   Electronically Signed   By: Margaree Mackintosh M.D.   On: 08/24/2013 11:49   Ct Chest W Contrast  08/24/2013   CLINICAL DATA:  Shortness of breath. Scoliosis. History of Wilms tumor.  EXAM: CT CHEST WITH CONTRAST  TECHNIQUE: Multidetector CT imaging of the chest was performed during intravenous contrast administration.  CONTRAST:  110mL OMNIPAQUE IOHEXOL 300 MG/ML  SOLN  COMPARISON:  Chest radiographs 08/24/2013. Lumbar spine CT 08/29/2012. Mild height loss involving the T12-L3 vertebral bodies is unchanged.  FINDINGS: No enlarged axillary lymph nodes are noted. Small paratracheal and precarinal lymph nodes measure up to 8 mm in short axis. Subcarinal lymph nodes measure up to 1.1 cm in short axis. A left-sided single lead pacemaker is present with tip in the left ventricular apex. The heart is mildly enlarged.  There is a large right pleural effusion with associated compressive atelectasis resulting in near complete right middle and lower lobe atelectasis. There is a small left pleural effusion. Patchy ground-glass opacities are present in the left upper lobe.  The visualized upper portion of the abdomen demonstrate sequelae of prior left nephrectomy. Hypoplastic T12-L3 vertebral bodies are unchanged.  IMPRESSION: 1. Large right pleural effusion with marked compressive atelectasis of the right middle and lower lobes. 2. Small left pleural effusion. 3. Small mediastinal lymph nodes, nonspecific. 4. Patchy left upper lobe ground-glass opacities, nonspecific. Query infection.   Electronically Signed   By: Logan Bores   On: 08/24/2013 15:31    EKG: Independently reviewed. Sinus tachycardia.   Assessment/Plan Principal Problem:   Pleural effusion Active Problems:   Nonischemic dilated cardiomyopathy   Tobacco abuse   Depression   Automatic implantable cardioverter-defibrillator in situ   Chronic systolic  congestive heart failure   PNA (pneumonia)  1-Acute Respiratory Failure, Hypoxic; In setting of right side large pleural effusion, Systolic Heart failure exacerbation and probably component of PNA.  Admit to telemetry.  I will start IV antibiotics; Ceftriaxone, Azithromycin.  IV lasix.  Sputum culture, HIV.   2-Right side pleural effusion; I have order US guide thoracentesis. Fluid send for Protein, LDH, cell count, cytology, culture.  Could be  secondary to Heart failure Vs PNA. Further therapy depending on pleural fluid results.   3-Acute Systolic Heart failure. Patient with pleural effusion, elevated BNP, Dyspnea. Will start IV lasix 20 Mg IV BID. Daily weight, strict I and O. Repeat ECHO. Resume coreg. Per Dr Lovena Le notes patient has not been able to tolerates ACE in the past due to hypotension. Will need to adjust lasix doses tomorrow.   4-Sinus tachycardia. In setting of hypoxemia. Oxygen supplementation, resume coreg.  5-Hypokalemia; Replete Kcl with 40 meq times 2 doses.  6-Depression; continue with Prozac.   Code Status: full code.  Family Communication: Care discussed with father who was at bedside.  Disposition Plan: expect 3 to 4 days inpatient.   Time spent: 75 minutes.   Niel Hummer A [facility] Pager [phone]

## 2013-08-24 NOTE — Addendum Note (Signed)
Addended by: WRAY, [place] on: 08/24/2013 04:41 PM   Modules accepted: Orders

## 2013-08-24 NOTE — ED Notes (Signed)
Placed on 2 liters oxygen via nasal cannula per Admitting MD request.

## 2013-08-25 ENCOUNTER — Encounter (HOSPITAL_COMMUNITY): Payer: Self-pay | Admitting: Cardiology

## 2013-08-25 ENCOUNTER — Inpatient Hospital Stay (HOSPITAL_COMMUNITY): Payer: Medicare Other

## 2013-08-25 DIAGNOSIS — Z95810 Presence of automatic (implantable) cardiac defibrillator: Secondary | ICD-10-CM

## 2013-08-25 DIAGNOSIS — I509 Heart failure, unspecified: Secondary | ICD-10-CM

## 2013-08-25 DIAGNOSIS — I369 Nonrheumatic tricuspid valve disorder, unspecified: Secondary | ICD-10-CM

## 2013-08-25 DIAGNOSIS — I428 Other cardiomyopathies: Secondary | ICD-10-CM

## 2013-08-25 DIAGNOSIS — J189 Pneumonia, unspecified organism: Secondary | ICD-10-CM

## 2013-08-25 DIAGNOSIS — I5023 Acute on chronic systolic (congestive) heart failure: Principal | ICD-10-CM

## 2013-08-25 LAB — CBC
HEMATOCRIT: 37.1 % — AB (ref 39.0–52.0)
HEMOGLOBIN: 12.6 g/dL — AB (ref 13.0–17.0)
MCH: 32.7 pg (ref 26.0–34.0)
MCHC: 34 g/dL (ref 30.0–36.0)
MCV: 96.4 fL (ref 78.0–100.0)
Platelets: 413 10*3/uL — ABNORMAL HIGH (ref 150–400)
RBC: 3.85 MIL/uL — AB (ref 4.22–5.81)
RDW: 14 % (ref 11.5–15.5)
WBC: 8.1 10*3/uL (ref 4.0–10.5)

## 2013-08-25 LAB — RAPID URINE DRUG SCREEN, HOSP PERFORMED
Amphetamines: NOT DETECTED
BARBITURATES: NOT DETECTED
BENZODIAZEPINES: NOT DETECTED
Cocaine: NOT DETECTED
Opiates: POSITIVE — AB
Tetrahydrocannabinol: NOT DETECTED

## 2013-08-25 LAB — BODY FLUID CELL COUNT WITH DIFFERENTIAL
EOS FL: 1 %
Lymphs, Fluid: 46 %
MONOCYTE-MACROPHAGE-SEROUS FLUID: 10 % — AB (ref 50–90)
Neutrophil Count, Fluid: 43 % — ABNORMAL HIGH (ref 0–25)
WBC FLUID: 1686 uL — AB (ref 0–1000)

## 2013-08-25 LAB — LACTATE DEHYDROGENASE, PLEURAL OR PERITONEAL FLUID: LD FL: 318 U/L — AB (ref 3–23)

## 2013-08-25 LAB — BASIC METABOLIC PANEL
BUN: 10 mg/dL (ref 6–23)
CHLORIDE: 94 meq/L — AB (ref 96–112)
CO2: 26 meq/L (ref 19–32)
Calcium: 8.8 mg/dL (ref 8.4–10.5)
Creatinine, Ser: 0.79 mg/dL (ref 0.50–1.35)
GFR calc Af Amer: >90 mL/min (ref >90)
GFR calc non Af Amer: >90 mL/min (ref >90)
GLUCOSE: 118 mg/dL — AB (ref 70–99)
Potassium: 4 mEq/L (ref 3.7–5.3)
SODIUM: 132 meq/L — AB (ref 137–147)

## 2013-08-25 LAB — PROTEIN, BODY FLUID: Total protein, fluid: 3 g/dL

## 2013-08-25 LAB — PROTIME-INR
INR: 1.33 (ref 0.00–1.49)
Prothrombin Time: 16.2 seconds — ABNORMAL HIGH (ref 11.6–15.2)

## 2013-08-25 MED ORDER — SPIRONOLACTONE 25 MG PO TABS
12.5000 mg | ORAL_TABLET | Freq: Every day | ORAL | Status: DC
  Administered 2013-08-25 – 2013-08-26 (×2): 12.5 mg via ORAL
  Filled 2013-08-25 (×2): qty 1

## 2013-08-25 MED ORDER — LISINOPRIL 5 MG PO TABS
2.5000 mg | ORAL_TABLET | Freq: Every day | ORAL | Status: DC
  Administered 2013-08-26: 2.5 mg via ORAL
  Filled 2013-08-25: qty 1

## 2013-08-25 MED ORDER — LISINOPRIL 5 MG PO TABS: 5.0000 mg | ORAL_TABLET | Freq: Every day | ORAL | Status: DC

## 2013-08-25 NOTE — Care Management Utilization Note (Signed)
UR completed 

## 2013-08-25 NOTE — Progress Notes (Addendum)
[facility] PROGRESS NOTE  [name] WEX:[id number] DOB: Sep 06, 1970 DOA: 08/24/2013 PCP: [name], MD   Brief narrative 43 year old male with history of nonischemic cardiomyopathy with EF of 25% on last echo, status post ICD, active smoker (30 pack years), alcohol abuse, depression, chronic back pain, hyperlipidemia, malnutrition who was sent to the ED by PCP for abnormal chest x-ray which showed large right-sided pleural effusion. Patient had seen his PCP for progressive dyspnea on exertion for past one week. Patient found to have acute hypoxic respiratory failure.    Assessment/Plan: Acute hypoxic respiratory failure Secondary to large right-sided pleural effusion and associated systolic CHF. Imaging also suggests possible pneumonia. Monitor on telemetry. increase IV Lasix to 40 mg twice a day.monitro I/O and daily weights. -Continue with empiric Rocephin and azithromycin for pneumonia. -Check 2-D echo. -CT chest with contrast shows no possible mass but possible left upper lobe infection. -Right-sided thoracentesis per radiology (diagnostic and therapeutic, will order a cytology as well.) -Replenish electrolytes.  Right sided pleural effusion  possibly para pneumonic vs secondary to CHF. Monitor pleural fluid  Ischemic cardiomyopathy Continue Lasix. Markedly elevated  pro BNP. Continue IV lasix. Continue aspirin beta blocker and statin.  depression Continue Prozac  Chronic back pain Continue Vicodin.  GERD Continue PPI  Protein calorie Malnutrition  nutrition consult  Tobacco abuse  counseled on cessation   DVT prophylaxis: Subcutaneous Lovenox  Diet: cardiac  Code Status: Full code Family Communication: Not at bedside  Disposition Plan: Home once improved   Consultants:  None  Procedures:  Ultrasound guided thoracentesis  Antibiotics:  Levaquin  HPI/Subjective: Patient seen and examined this morning. Admission H&P reviewed. Reports  his shortness of breath we better.  Objective: Filed Vitals:   08/25/13 0607  BP: 97/60  Pulse: 94  Temp: 98.4 F (36.9 C)  Resp: 19    Intake/Output Summary (Last 24 hours) at 08/25/13 1059 Last data filed at 08/25/13 0900  Gross per 24 hour  Intake      0 ml  Output   1150 ml  Net  -1150 ml   Filed Weights   08/24/13 1219 08/24/13 1753 08/25/13 0607  Weight: 57.607 kg (127 lb) 57.607 kg (127 lb) 57.6 kg (126 lb 15.8 oz)    Exam:   General: Middle aged thin built male in no acute distress  HEENT: No pallor, moist oral mucosa, no JVD  Cardiovascular: NS1&S2, no murmurs, rubs gallop  Respiratory: minimal breath sounds over right lung base, clear breath sounds over left, no added sounds  Abdomen: soft, NT, ND, BS+  Musculoskeletal: warm, trace edema  CNS: AAOX3  Data Reviewed: Basic Metabolic Panel:  Recent Labs Lab 08/24/13 1000 08/24/13 1250 08/25/13 0507  NA 131* 133* 132*  K 4.0 3.6* 4.0  CL 92* 92* 94*  CO2 29 30 26   GLUCOSE 140* 140* 118*  BUN 7 7 10   CREATININE 0.72 0.67 0.79  CALCIUM 8.7 9.1 8.8   Liver Function Tests:  Recent Labs Lab 08/24/13 1000 08/24/13 1250  AST 19 25  ALT 18 18  ALKPHOS 118* 132*  BILITOT 1.1 1.0  PROT 6.2 7.2  ALBUMIN 3.1* 2.8*   No results found for this basename: LIPASE, AMYLASE,  in the last 168 hours No results found for this basename: AMMONIA,  in the last 168 hours CBC:  Recent Labs Lab 08/24/13 1000 08/24/13 1250 08/25/13 0507  WBC 8.0 8.7 8.1  NEUTROABS 5.8 6.3  --   HGB 14.5 14.7 12.6*  HCT  41.8 43.5 37.1*  MCV 94.1 97.5 96.4  PLT 452* 382 413*   Cardiac Enzymes:  Recent Labs Lab 08/24/13 1250  TROPONINI <0.30   BNP (last 3 results)  Recent Labs  08/24/13 1250  PROBNP 6856.0*   CBG: No results found for this basename: GLUCAP,  in the last 168 hours  No results found for this or any previous visit (from the past 240 hour(s)).   Studies: Dg Chest 2 View  08/24/2013    ADDENDUM REPORT: 08/24/2013 12:02  ADDENDUM: The first sentence of the Impression #1. Should state: Diffuse increased density within the right hemi thorax with a component appearing to track along the lateral superior aspect of the right chest wall has the appearance of a moderate to large right pleural effusion.   Electronically Signed   By: Margaree Mackintosh M.D.   On: 08/24/2013 12:02   08/24/2013   CLINICAL DATA:  cOUGH, RIGHT RIB PAIN  EXAM: CHEST  2 VIEW  COMPARISON:  DG RIBS UNILATERAL*R* dated 08/24/2013; DG CHEST 2 VIEW dated 01/08/2013  FINDINGS: The cardiac silhouette is enlarged. A left chest wall AICD is identified lead tip projecting in the region the right ventricle. There is diffuse increased density within the right hemi thorax with a meniscal apex tracking along the superior lateral aspect of the right chest wall. This finding has the appearance of a moderate to large pleural effusion. There is increased density within the medial base of the aerated portion of the right upper lobe. Mild prominence of the interstitial markings identified. No further focal regions of consolidation or focal infiltrates are appreciated. The osseous structures are unremarkable.  IMPRESSION: 1. Diffuse increased density within the right hemi thorax appears to track along the lateral superior aspect of the right chest wall says the appearance of a right pleural effusion. Underlying component of atelectasis versus infiltrate cannot be excluded. Underlying mass or nodule also cannot be excluded. Likely compressive atelectasis within the medial aspect of the aerated portion of the right lung. 2. Mild interstitial prominence may reflect a component of pulmonary vascular congestion. 3. No further focal regions of consolidation no focal infiltrates are appreciated. 4. These results will be called to the ordering clinician or representative by the Radiologist Assistant, and communication documented in the PACS Dashboard.   Electronically Signed: By: Margaree Mackintosh M.D. On: 08/24/2013 11:48   Dg Ribs Unilateral Right  08/24/2013   CLINICAL DATA:  RIB PAIN, COUGH  Is  EXAM: RIGHT RIBS - 2 VIEW  COMPARISON:  None.  FINDINGS: No fracture or other bone lesions are seen involving the ribs. There is diffuse increased density within the right hemi thorax described on recent chest radiograph.  IMPRESSION: No evidence of acute osseous abnormalities.   Electronically Signed   By: Margaree Mackintosh M.D.   On: 08/24/2013 11:49   Ct Chest W Contrast  08/24/2013   CLINICAL DATA:  Shortness of breath. Scoliosis. History of Wilms tumor.  EXAM: CT CHEST WITH CONTRAST  TECHNIQUE: Multidetector CT imaging of the chest was performed during intravenous contrast administration.  CONTRAST:  31mL OMNIPAQUE IOHEXOL 300 MG/ML  SOLN  COMPARISON:  Chest radiographs 08/24/2013. Lumbar spine CT 08/29/2012. Mild height loss involving the T12-L3 vertebral bodies is unchanged.  FINDINGS: No enlarged axillary lymph nodes are noted. Small paratracheal and precarinal lymph nodes measure up to 8 mm in short axis. Subcarinal lymph nodes measure up to 1.1 cm in short axis. A left-sided single lead pacemaker is present with tip  in the left ventricular apex. The heart is mildly enlarged.  There is a large right pleural effusion with associated compressive atelectasis resulting in near complete right middle and lower lobe atelectasis. There is a small left pleural effusion. Patchy ground-glass opacities are present in the left upper lobe.  The visualized upper portion of the abdomen demonstrate sequelae of prior left nephrectomy. Hypoplastic T12-L3 vertebral bodies are unchanged.  IMPRESSION: 1. Large right pleural effusion with marked compressive atelectasis of the right middle and lower lobes. 2. Small left pleural effusion. 3. Small mediastinal lymph nodes, nonspecific. 4. Patchy left upper lobe ground-glass opacities, nonspecific. Query infection.   Electronically  Signed   By: Logan Bores   On: 08/24/2013 15:31    Scheduled Meds: . aspirin  81 mg Oral Daily  . azithromycin  500 mg Intravenous Q24H  . calcium-vitamin D  2 tablet Oral q morning - 10a  . carvedilol  18.75 mg Oral Q breakfast  . cefTRIAXone (ROCEPHIN)  IV  1 g Intravenous Q24H  . enoxaparin (LOVENOX) injection  40 mg Subcutaneous Q24H  . FLUoxetine  20 mg Oral q morning - 10a  . furosemide  20 mg Intravenous Q12H  . multivitamin with minerals  1 tablet Oral q morning - 10a  . pantoprazole  80 mg Oral Daily  . sodium chloride  3 mL Intravenous Q12H   Continuous Infusions:     Time spent: 25 minutes    Macallister Ashmead  [facility] Pager [phone]. If 7PM-7AM, please contact night-coverage at [email or web address], password [facility] 08/25/2013, 10:59 AM  LOS: 1 day

## 2013-08-25 NOTE — Care Management Note (Signed)
    Page 1 of 1   08/25/2013     2:54:43 PM   CARE MANAGEMENT NOTE 08/25/2013  Patient:  [name], [name]   Account Number:  [id number]  Date Initiated:  08/25/2013  Documentation initiated by:  Claretha Cooper  Subjective/Objective Assessment:   Pt admitted from home where he lives with his son. No needs identified. Pt eager for DC     Action/Plan:   Anticipated DC Date:  08/26/2013   Anticipated DC Plan:  [place]  CM consult      Choice offered to / List presented to:             Status of service:  Completed, signed off Medicare Important Message given?   (If response is "NO", the following Medicare IM given date fields will be blank) Date Medicare IM given:   Date Additional Medicare IM given:    Discharge Disposition:    Per UR Regulation:    If discussed at Long Length of Stay Meetings, dates discussed:    Comments:  08/25/16 Claretha Cooper RN BSN CM

## 2013-08-25 NOTE — Progress Notes (Signed)
Paracentesis complete no signs of distress. 1280 ml red colored pleural fluid removed.

## 2013-08-25 NOTE — Consult Note (Signed)
Primary cardiologist: Dr. Lewayne Bunting Consulting cardiologist: Dr. Jonelle Sidle  Clinical Summary [name] is a 43 y.o.male presently admitted to the hospital with new finding of a large right pleural effusion after visit with his primary care provider due to pleuritic chest pain and cough. He has a history of nonischemic cardiomyopathy with severe left ventricular dysfunction as detailed below, status post ICD placement. He was previously followed by Dr. Dietrich Pates, last general cardiology visit was with [name] back in February 2014. He states that he has had some mild ankle edema, reports compliance with his low-dose diuretic at baseline. In reviewing his records, he has been on Coreg but no afterload reducing agents due to relatively low blood pressure. He denies any major weight changes at home. He currently reports NYHA class II dyspnea, mild orthopnea recently. He has had no device shocks, denies any palpitations or syncope.  During hospitalization he has been treated with low-dose IV Lasix accomplishing some diuresis, but ultimately underwent right-sided thoracentesis this afternoon to better address the pleural effusion. He has had no chest pain, troponin I was negative, pro-BNP elevated at 6856. ECG showed sinus tachycardia with left atrial enlargement and rightward axis.  Followup echocardiogram done during this hospitalization shows upper normal LV chamber size with LVEF approximately 15%, diffuse hypokinesis with some regional variation, restrictive diastolic filling pattern and evidence of low cardiac output. RV is mildly dilated with reduced function, PASP 50 mm mercury.  We have been consulted to assist with his management.   Allergies  Allergen Reactions  . Ibuprofen Hives    Medications Scheduled Medications: . aspirin  81 mg Oral Daily  . azithromycin  500 mg Intravenous Q24H  . calcium-vitamin D  2 tablet Oral q morning - 10a  . carvedilol  18.75 mg  Oral Q breakfast  . cefTRIAXone (ROCEPHIN)  IV  1 g Intravenous Q24H  . enoxaparin (LOVENOX) injection  40 mg Subcutaneous Q24H  . FLUoxetine  20 mg Oral q morning - 10a  . furosemide  20 mg Intravenous Q12H  . lisinopril  5 mg Oral Daily  . multivitamin with minerals  1 tablet Oral q morning - 10a  . pantoprazole  80 mg Oral Daily  . sodium chloride  3 mL Intravenous Q12H    PRN Medications: sodium chloride, acetaminophen, acetaminophen, HYDROcodone-acetaminophen, morphine injection, ondansetron (ZOFRAN) IV, ondansetron, sodium chloride   Past Medical History  Diagnosis Date  . Cardiomyopathy, nonischemic     LVEF 20-25%, ICD in place  . Tobacco abuse   . Orthostatic hypotension   . Alcohol consumption heavy   . Scoliosis   . Fasting hyperglycemia   . Wilm's tumor     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 69 (XRT and chemo)  . Hyperlipidemia   . Chronic systolic heart failure   . Arthritis   . Chronic back pain   . Depression   . History of cardiac catheterization     Normal coronaries and right heart pressures 12/2010     Past Surgical History  Procedure Laterality Date  . Nephrectomy  36 yrs ago    Wilms Tumor  . Icd  05/25/2011    [employer] SG lead/[employer] single chamber device  . Carpal tunnel release Right 01/09/2013    Procedure: CARPAL TUNNEL RELEASE;  Surgeon: Carmela Hurt, MD;  Location: [facility];  Service: Neurosurgery;  Laterality: Right;  RIGHT carpal tunnel release  . Ulnar nerve transposition Right 01/09/2013    Procedure:  ULNAR NERVE DECOMPRESSION/TRANSPOSITION;  Surgeon: Winfield Cunas, MD;  Location: [facility];  Service: Neurosurgery;  Laterality: Right;  RIGHT ulnar nerve decompression  . Carpal tunnel release Left 01/30/2013    Procedure: LEFT CARPAL TUNNEL RELEASE;  Surgeon: Winfield Cunas, MD;  Location: [facility];  Service: Neurosurgery;  Laterality: Left;  LEFT Carpal Tunnel release    History reviewed. No pertinent family  history.  Social History [name] reports that he has been smoking Cigarettes.  He has a 5 pack-year smoking history. He has never used smokeless tobacco. [name] reports that he drinks alcohol.  Review of Systems Stable appetite. No bleeding problems. Reports compliance with his medications. Otherwise as outlined above.  Physical Examination Blood pressure 101/70, pulse 90, temperature 99.4 F (37.4 C), temperature source Oral, resp. rate 18, height 5\' 9"  (1.753 m), weight 126 lb 15.8 oz (57.6 kg), SpO2 96.00%.  Intake/Output Summary (Last 24 hours) at 08/25/13 1659 Last data filed at 08/25/13 1242  Gross per 24 hour  Intake      0 ml  Output   1200 ml  Net  -1200 ml   Thin male, no distress, breathing comfortably at rest. HEENT: Conjunctiva and lids normal, oropharynx clear. Neck: Supple, elevated JVP, no carotid bruits, no thyromegaly. Lungs: Clear to auscultation with diminished to absent sounds at the right base, nonlabored breathing at rest. Cardiac: Indistinct PMI, fegular rate and rhythm, S3, no significant systolic murmur, no pericardial rub. Abdomen: Soft, nontender, bowel sounds present, no guarding or rebound. Extremities: No pitting edema, distal pulses 2+. Skin: Warm and dry. Musculoskeletal: No kyphosis. Neuropsychiatric: Alert and oriented x3, affect grossly appropriate.   Lab Results  Basic Metabolic Panel:  Recent Labs Lab 08/24/13 1000 08/24/13 1250 08/25/13 0507  NA 131* 133* 132*  K 4.0 3.6* 4.0  CL 92* 92* 94*  CO2 29 30 26   GLUCOSE 140* 140* 118*  BUN 7 7 10   CREATININE 0.72 0.67 0.79  CALCIUM 8.7 9.1 8.8    Liver Function Tests:  Recent Labs Lab 08/24/13 1000 08/24/13 1250  AST 19 25  ALT 18 18  ALKPHOS 118* 132*  BILITOT 1.1 1.0  PROT 6.2 7.2  ALBUMIN 3.1* 2.8*    CBC:  Recent Labs Lab 08/24/13 1000 08/24/13 1250 08/25/13 0507  WBC 8.0 8.7 8.1  NEUTROABS 5.8 6.3  --   HGB 14.5 14.7 12.6*  HCT 41.8 43.5 37.1*   MCV 94.1 97.5 96.4  PLT 452* 382 413*    Cardiac Enzymes:  Recent Labs Lab 08/24/13 1250  TROPONINI <0.30    Echocardiogram (08/25/13): Study Conclusions  - Left ventricle: The cavity size was at the upper limits of normal. Wall thickness was normal. Transverse false tendon incidentally noted in LV. Systolic function was severely reduced. The estimated ejection fraction was 15%. Diffuse hypokinesis with some regional variation. Doppler parameters are consistent with restrictive physiology, indicative of decreased left ventricular diastolic compliance and/or increased left atrial pressure. - Mitral valve: Trivial regurgitation. - Left atrium: The atrium was moderately to severely dilated. - Right ventricle: The cavity size was mildly dilated. Devicewire or catheter noted in right ventricle. Systolic function was mildly to moderately reduced. - Right atrium: Central venous pressure: 62mm Hg (est). - Atrial septum: No defect or patent foramen ovale was identified. - Tricuspid valve: Moderate regurgitation. - Pulmonic valve: Mild regurgitation. - Pulmonary arteries: Systolic pressure was moderately increased. PA peak pressure: 52mm Hg (S). - Pericardium, extracardiac: There was no pericardial  effusion. Impressions:  - Upper normal LV chamber size with LVEF approximately 15%, diffuse hypokinesis with some regional variation, restrictive diastolic filling pattern. Noninvasive parameters consistent with low cardiac output. Moderate to severe left atrial enlargement. Trivial mitral regurgitation. Mild RV dilatation with mldly to moderately reduced contraction. Device wire in the right heart. Moderate tricuspid regurgitation with PASP 50 mmHg.  Imaging ULTRASOUND GUIDED RIGHT THORACENTESIS  COMPARISON: CT chest dated 08/24/2013.  PROCEDURE: An ultrasound guided thoracentesis was thoroughly discussed with the patient and questions answered. The benefits, risks,  alternatives and complications were also discussed. The patient understands and wishes to proceed with the procedure. Written consent was obtained.  Ultrasound was performed to localize and mark an adequate pocket of fluid in the right chest. The area was then prepped and draped in the normal sterile fashion. 1% Lidocaine was used for local anesthesia. Under ultrasound guidance a 19 gauge Yueh catheter was introduced. Thoracentesis was performed. The catheter was removed and a dressing applied.  Complications: None.  FINDINGS: A total of approximately 1280 mL of amber fluid was removed. A fluid sample was sent for laboratory analysis.  IMPRESSION: Successful ultrasound guided right thoracentesis yielding 1280 mL of pleural fluid.   Impression  1. Large right pleural effusion status post thoracentesis, noted in association with nonischemic cardiomyopathy and severely reduced LVEF of 15%. He has a history of chronic systolic heart failure, does endorse some mild ankle and leg edema recently, but otherwise no major weight change and just recent decline in his respiratory status. He states that he is more comfortable this afternoon.  2. Nonischemic myopathy, LVEF approximately 15% as outlined above with restrictive diastolic filling pattern and evidence of low cardiac output. Also has associated moderate pulmonary hypertension. Patient is status post [place].  3. Cardiac catheterization in 2012 demonstrated normal coronaries in right heart pressures.  4. Prior history of alcohol abuse.  5. Ongoing tobacco use.  6. History of childhood Wilms tumor status post nephrectomy. Creatinine is normal.  7. History of chronic pain with kyphoscoliosis and disc disease.   Recommendations  Current regimen includes aspirin, Coreg, Lovenox, IV Lasix, and recently initiated lisinopril. He is feeling better status post thoracentesis. Recommend stopping aspirin and transition to oral  Lasix tomorrow plus low-dose Aldactone. I spoke with Mr. Tex[place] about having him established in the Advanced Heart Failure clinic for further evaluation as I suspect he will need a repeat right heart catheterization to best determine the next step in his care, particularly if further titration of medical therapy remains limited. He has done relatively well by report, although has had some decline in symptom control recently.  Satira Sark, M.D., F.A.C.C.

## 2013-08-25 NOTE — Progress Notes (Signed)
*  PRELIMINARY RESULTS* Echocardiogram 2D Echocardiogram has been performed.  [place], [place] 08/25/2013, 10:07 AM

## 2013-08-25 NOTE — Progress Notes (Signed)
INITIAL NUTRITION ASSESSMENT  DOCUMENTATION CODES Per approved criteria  -Not Applicable   INTERVENTION: Follow for diet advancement. Add supplement with diet advancement.  NUTRITION DIAGNOSIS: Inadequate oral intake related to altered GI function as evidenced by NPO.   Goal: Pt will meet >90% of estimated nutritional needs  Monitor:  Diet advancement, PO intake, labs, skin assessments, weight changes, I/O's   Reason for Assessment: MD consult to assess needs  43 y.o. male  Admitting Dx: Pleural effusion  ASSESSMENT: Pt admitted with pleural effusion.  Pt and family not available at time of visit. Pt was down in paracentesis. Pt is currently NPO and there is no PO data at this time.  UBW 123#, Weight has been stable this past year, although noted 6.3% wt gain approximately 7 months ago., but has since regained weight. Weight changes may be related to fluid status, as pt takes Lasix at home and has hx of CHF.  Unable to diagnose pt with malnutrition at this time, however, pt is at risk for malnutrition given multiple medical conditions. Would benefit from nutritional supplement that will be added upon diet advancement.   Height: Ht Readings from Last 1 Encounters:  08/24/13 5\' 9"  (1.753 m)    Weight: Wt Readings from Last 1 Encounters:  08/25/13 126 lb 15.8 oz (57.6 kg)    Ideal Body Weight: 160#  % Ideal Body Weight: 79%  Wt Readings from Last 10 Encounters:  08/25/13 126 lb 15.8 oz (57.6 kg)  08/24/13 127 lb (57.607 kg)  05/18/13 123 lb (55.792 kg)  01/28/13 118 lb 11.2 oz (53.842 kg)  01/28/13 118 lb 11.2 oz (53.842 kg)  01/08/13 118 lb 6.4 oz (53.706 kg)  11/12/12 124 lb (56.246 kg)  08/29/12 124 lb (56.246 kg)  08/12/12 123 lb 1.9 oz (55.847 kg)  08/05/12 124 lb (56.246 kg)    Usual Body Weight: 123#  % Usual Body Weight: 102%  BMI:  Body mass index is 18.74 kg/(m^2). Meets criteria for normal weight.  Estimated Nutritional Needs: Kcal: 1431-1718  daily Protein: 57-72 grams daily Fluid: 1.4-1.7 L daily  Skin: WDL  Diet Order: NPO  EDUCATION NEEDS: -Education not appropriate at this time   Intake/Output Summary (Last 24 hours) at 08/25/13 1629 Last data filed at 08/25/13 1242  Gross per 24 hour  Intake      0 ml  Output   1200 ml  Net  -1200 ml    Last BM: 08/24/13  Labs:   Recent Labs Lab 08/24/13 1000 08/24/13 1250 08/25/13 0507  NA 131* 133* 132*  K 4.0 3.6* 4.0  CL 92* 92* 94*  CO2 29 30 26   BUN 7 7 10   CREATININE 0.72 0.67 0.79  CALCIUM 8.7 9.1 8.8  GLUCOSE 140* 140* 118*    CBG (last 3)  No results found for this basename: GLUCAP,  in the last 72 hours  Scheduled Meds: . aspirin  81 mg Oral Daily  . azithromycin  500 mg Intravenous Q24H  . calcium-vitamin D  2 tablet Oral q morning - 10a  . carvedilol  18.75 mg Oral Q breakfast  . cefTRIAXone (ROCEPHIN)  IV  1 g Intravenous Q24H  . enoxaparin (LOVENOX) injection  40 mg Subcutaneous Q24H  . FLUoxetine  20 mg Oral q morning - 10a  . furosemide  20 mg Intravenous Q12H  . lisinopril  5 mg Oral Daily  . multivitamin with minerals  1 tablet Oral q morning - 10a  . pantoprazole  80  mg Oral Daily  . sodium chloride  3 mL Intravenous Q12H    Continuous Infusions:   Past Medical History  Diagnosis Date  . Cardiomyopathy, nonischemic     LVEF 20-25%, ICD in place  . Tobacco abuse   . Orthostatic hypotension   . Alcohol consumption heavy   . Scoliosis   . Fasting hyperglycemia   . Wilm's tumor     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 and chemo)  . Hyperlipidemia   . Chronic systolic heart failure   . Arthritis   . Chronic back pain   . Depression   . History of cardiac catheterization     Normal coronaries and right heart pressures 12/2010     Past Surgical History  Procedure Laterality Date  . Nephrectomy  36 yrs ago    Wilms Tumor  . Icd  05/25/2011    [employer] SG lead/[employer] single chamber device  . Carpal tunnel  release Right 01/09/2013    Procedure: CARPAL TUNNEL RELEASE;  Surgeon: Winfield Cunas, MD;  Location: [facility] [facility];  Service: Neurosurgery;  Laterality: Right;  RIGHT carpal tunnel release  . Ulnar nerve transposition Right 01/09/2013    Procedure: ULNAR NERVE DECOMPRESSION/TRANSPOSITION;  Surgeon: Winfield Cunas, MD;  Location: [facility];  Service: Neurosurgery;  Laterality: Right;  RIGHT ulnar nerve decompression  . Carpal tunnel release Left 01/30/2013    Procedure: LEFT CARPAL TUNNEL RELEASE;  Surgeon: Winfield Cunas, MD;  Location: [place];  Service: Neurosurgery;  Laterality: Left;  LEFT Carpal Tunnel release    Terese Heier A. Jimmye Norman, RD, LDN Pager: [phone]

## 2013-08-26 LAB — BASIC METABOLIC PANEL
BUN: 16 mg/dL (ref 6–23)
CALCIUM: 8.3 mg/dL — AB (ref 8.4–10.5)
CO2: 27 mEq/L (ref 19–32)
Chloride: 93 mEq/L — ABNORMAL LOW (ref 96–112)
Creatinine, Ser: 0.97 mg/dL (ref 0.50–1.35)
GFR calc Af Amer: >90 mL/min (ref >90)
GFR calc non Af Amer: >90 mL/min (ref >90)
GLUCOSE: 153 mg/dL — AB (ref 70–99)
POTASSIUM: 3.8 meq/L (ref 3.7–5.3)
Sodium: 131 mEq/L — ABNORMAL LOW (ref 137–147)

## 2013-08-26 MED ORDER — LISINOPRIL 2.5 MG PO TABS: 2.5000 mg | ORAL_TABLET | Freq: Every day | ORAL | Status: DC

## 2013-08-26 MED ORDER — FUROSEMIDE 20 MG PO TABS
20.0000 mg | ORAL_TABLET | Freq: Two times a day (BID) | ORAL | Status: DC
  Administered 2013-08-26: 20 mg via ORAL
  Filled 2013-08-26: qty 1

## 2013-08-26 MED ORDER — SPIRONOLACTONE 12.5 MG HALF TABLET: 12.5000 mg | ORAL_TABLET | Freq: Every day | ORAL | Status: DC

## 2013-08-26 MED ORDER — FUROSEMIDE 20 MG PO TABS: 20.0000 mg | ORAL_TABLET | Freq: Two times a day (BID) | ORAL | Status: DC

## 2013-08-26 MED ORDER — ENSURE COMPLETE PO LIQD: 237.0000 mL | Freq: Two times a day (BID) | ORAL | Status: DC

## 2013-08-26 NOTE — Progress Notes (Signed)
Pt discharged home today per Dr. Verlon Au. Pt's IV site D/C'd and WNL. Pt's VSS. Pt provided with home medication list, discharge instructions and prescriptions. Verbalized understanding. Pt left floor via WC in stable condition accompanied by NT.

## 2013-08-26 NOTE — Progress Notes (Signed)
Consulting cardiologist: Rozann Lesches MD Primary Cardiologist: Cristopher Peru MD  Subjective:    Feels much better. Supposed to go home after lunch.  Objective:   Temp:  [98.4 F (36.9 C)-99.4 F (37.4 C)] 98.4 F (36.9 C) (03/18 0500) Pulse Rate:  [90-101] 96 (03/18 0500) Resp:  [17-20] 17 (03/18 0500) BP: (90-101)/(59-70) 95/61 mmHg (03/18 0500) SpO2:  [93 %-96 %] 95 % (03/18 0753) Last BM Date: 08/25/13  Filed Weights   08/24/13 1219 08/24/13 1753 08/25/13 0607  Weight: 127 lb (57.607 kg) 127 lb (57.607 kg) 126 lb 15.8 oz (57.6 kg)    Intake/Output Summary (Last 24 hours) at 08/26/13 0945 Last data filed at 08/25/13 2030  Gross per 24 hour  Intake    300 ml  Output    150 ml  Net    150 ml    Exam:  General: No acute distress.  Lungs: Coarse breath sounds, decreased at right base, no wheezes.   Cardiac: Indistinct PMI, JVP 12 cm, RRR, S3, no rub.   Extremities: No pitting edema.   Lab Results:  Basic Metabolic Panel:  Recent Labs Lab 08/24/13 1250 08/25/13 0507 08/26/13 0511  NA 133* 132* 131*  K 3.6* 4.0 3.8  CL 92* 94* 93*  CO2 30 26 27   GLUCOSE 140* 118* 153*  BUN 7 10 16   CREATININE 0.67 0.79 0.97  CALCIUM 9.1 8.8 8.3*    Liver Function Tests:  Recent Labs Lab 08/24/13 1000 08/24/13 1250  AST 19 25  ALT 18 18  ALKPHOS 118* 132*  BILITOT 1.1 1.0  PROT 6.2 7.2  ALBUMIN 3.1* 2.8*    CBC:  Recent Labs Lab 08/24/13 1000 08/24/13 1250 08/25/13 0507  WBC 8.0 8.7 8.1  HGB 14.5 14.7 12.6*  HCT 41.8 43.5 37.1*  MCV 94.1 97.5 96.4  PLT 452* 382 413*    Cardiac Enzymes:  Recent Labs Lab 08/24/13 1250  TROPONINI <0.30    BNP:  Recent Labs  08/24/13 1250  PROBNP 6856.0*    Coagulation:  Recent Labs Lab 08/24/13 1250 08/25/13 0507  INR 1.23 1.33    Radiology: Dg Chest 1 View  08/25/2013   CLINICAL DATA:  Post thoracentesis  EXAM: CHEST - 1 VIEW  COMPARISON:  CT chest dated 08/24/2013  FINDINGS: No  pneumothorax is seen status post right thoracentesis.  Moderate right pleural effusion, decreased, with mild apical capping. Underlying compressive atelectasis of the right middle and lower lobes.  Left lung is essentially clear.  The heart is normal in size.  Left subclavian ICD.  IMPRESSION: No pneumothorax is seen status post right thoracentesis.  Moderate right pleural effusion.   Electronically Signed   By: Julian Hy M.D.   On: 08/25/2013 16:34    Echocardiogram (08/25/13):  Study Conclusions  - Left ventricle: The cavity size was at the upper limits of normal. Wall thickness was normal. Transverse false tendon incidentally noted in LV. Systolic function was severely reduced. The estimated ejection fraction was 15%. Diffuse hypokinesis with some regional variation. Doppler parameters are consistent with restrictive physiology, indicative of decreased left ventricular diastolic compliance and/or increased left atrial pressure. - Mitral valve: Trivial regurgitation. - Left atrium: The atrium was moderately to severely dilated. - Right ventricle: The cavity size was mildly dilated. Devicewire or catheter noted in right ventricle. Systolic function was mildly to moderately reduced. - Right atrium: Central venous pressure: 86mm Hg (est). - Atrial septum: No defect or patent foramen ovale was identified. -  Tricuspid valve: Moderate regurgitation. - Pulmonic valve: Mild regurgitation. - Pulmonary arteries: Systolic pressure was moderately increased. PA peak pressure: 31mm Hg (S). - Pericardium, extracardiac: There was no pericardial effusion. Impressions:  - Upper normal LV chamber size with LVEF approximately 15%, diffuse hypokinesis with some regional variation, restrictive diastolic filling pattern. Noninvasive parameters consistent with low cardiac output. Moderate to severe left atrial enlargement. Trivial mitral regurgitation. Mild RV dilatation with mldly to  moderately reduced contraction. Device wire in the right heart. Moderate tricuspid regurgitation with PASP 50 mmHg.   Medications:   Scheduled Medications: . azithromycin  500 mg Intravenous Q24H  . calcium-vitamin D  2 tablet Oral q morning - 10a  . carvedilol  18.75 mg Oral Q breakfast  . cefTRIAXone (ROCEPHIN)  IV  1 g Intravenous Q24H  . enoxaparin (LOVENOX) injection  40 mg Subcutaneous Q24H  . feeding supplement (ENSURE COMPLETE)  237 mL Oral BID BM  . FLUoxetine  20 mg Oral q morning - 10a  . furosemide  20 mg Oral BID  . lisinopril  2.5 mg Oral Daily  . multivitamin with minerals  1 tablet Oral q morning - 10a  . pantoprazole  80 mg Oral Daily  . sodium chloride  3 mL Intravenous Q12H  . spironolactone  12.5 mg Oral Daily    PRN Medications: sodium chloride, acetaminophen, acetaminophen, HYDROcodone-acetaminophen, morphine injection, ondansetron (ZOFRAN) IV, ondansetron, sodium chloride   Assessment and Plan:   1. NICM LVEF 15%: He is feeling better with diuresis and thoracentesis. He has now been made appt with Dr. Haroldine Laws in the CHF clinic in [facility] on March 25th and 11:40 for recommendations for ongoing management.  He will continue on low sodium diet restriction and remain on current medication regimen with lasix 20 mg BID, spironolactone, lisinopril, and carvedilol 18.75 mg BID.   He has applied for disability, and is to have hearing this month.   2. Right Pleural effusion: S/P thoracentesis with removal of 1,280 ml of of pleural fluid. CT revealed compressive atelectasis of the right and middle lower lobes, with patchy upper lobe ground-glass appearance. Breathing status is significantly improved.   3. Ongoing Tobacco abuse: He is again recommended to stop smoking.    Phill Myron. Purcell Nails NP [facility] 08/26/2013, 9:45 AM   Attending note:  Patient seen and examined. Modified above note by [name]. Please refer to my consultation from  yesterday. Mr. Amadon feels better clinically. He will be going home later today. Current regimen now includes Coreg, Lasix, low-dose lisinopril, and low-dose Aldactone. He is being scheduled for evaluation in the [facility] by Dr. Haroldine Laws. I suspect he will need a right heart catheterization to reassess pressures and help determine the next step in his care. Smoking cessation was reinforced.  Satira Sark, M.D., F.A.C.C.

## 2013-08-26 NOTE — Discharge Summary (Signed)
Physician Discharge Summary  [name] TML:[id number] DOB: 1970/10/11 DOA: 08/24/2013  PCP: [name], MD  Admit date: 08/24/2013 Discharge date: 08/26/2013  Time spent: 35 minutes  Recommendations for Outpatient Follow-up:  1. Follow with Cardiology for further care 2. Get daily weights.  Instructions given to patient to increase fluid medicine pills if gains more than 2 pounds over 24. 3. Follow up on pleural fluid cultures performed 3/17. 4. Has already been set up with advanced heart failure team in [facility] March 25  Discharge Diagnoses:  Principal Problem:   Pleural effusion Active Problems:   Nonischemic dilated cardiomyopathy   Tobacco abuse   Depression   Automatic implantable cardioverter-defibrillator in situ   Chronic systolic congestive heart failure   PNA (pneumonia)   Acute respiratory failure   Acute on chronic systolic heart failure   Discharge Condition: Stable  Diet recommendation: Heart healthy  Filed Weights   08/24/13 1219 08/24/13 1753 08/25/13 0607  Weight: 57.607 kg (127 lb) 57.607 kg (127 lb) 57.6 kg (126 lb 15.8 oz)    History of present illness:  This pleasant 43 year old Caucasian male was admitted 08/24/13 with chest pain/DOB/pleuritic chest pain.  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 Pro BNP on admission 6856 Admitted to a [facility], started on IV Lasix 40 twice a day. CT chest performed = large right-sided pleural effusion Repeat echocardiogram = LVEF 15%, diffuse hypokinesis, PASP 50 mmhg Ultimately had 1.28 L drained off of right chest resulting in a smaller moderate right-sided effusion on followup x-ray- Per Light's Criterion=exudative however more likely than not pseudo-exudate given chronic heart failure history, no fever no chills Recommended PCP followup cultures from pleural fluid Cardiology consulted,  medications adjusted including addition of Aldactone, low-dose lisinopril and up titration of Lasix from once to twice daily Recommended close out patient follow with advanced heart failure team patient had met maximal in hospital benefit from this admission and was discharged home in a stable state   Discharge Exam: Filed Vitals:   08/26/13 0500  BP: 95/61  Pulse: 96  Temp: 98.4 F (36.9 C)  Resp: 17    General:  EOMI, NCAT  Cardiovascular:  S1-S2 no murmur rub or gallop  Respiratory: clear   Discharge Instructions  Discharge Orders   Future Appointments Provider Department Dept Phone   09/02/2013 11:40 AM [facility] [place] [phone]   Future Orders Complete By Expires   Diet - low sodium heart healthy  As directed    Discharge instructions  As directed    Comments:     Follow with cardiology as an outpatient as has been recommended to Get daily weights Increase your Lasix dose by one tablet if you gain more than 2 pounds over a 24-hour period Please see regular physician Dr. Buelah Manis in about one week Please continue tobacco cessation   Increase activity slowly  As directed        Medication List    STOP taking these medications       multivitamin with minerals tablet      TAKE these medications       aspirin 81 MG tablet  Take 81 mg by mouth daily.     calcium-vitamin D 500-200 MG-UNIT per tablet  Commonly known as:  OSCAL WITH D  Take 2 tablets by mouth every morning.  carvedilol 12.5 MG tablet  Commonly known as:  COREG  Take 1.5 tablets (18.75 mg total) by mouth every morning.     FLUoxetine 20 MG capsule  Commonly known as:  PROZAC  Take 1 capsule (20 mg total) by mouth every morning.     furosemide 20 MG tablet  Commonly known as:  LASIX  Take 1 tablet (20 mg total) by mouth 2 (two) times daily.     HYDROcodone-acetaminophen 7.5-325 MG per tablet  Commonly known as:  NORCO  Take 1 tablet  by mouth 2 (two) times daily as needed.     lisinopril 2.5 MG tablet  Commonly known as:  PRINIVIL,ZESTRIL  Take 1 tablet (2.5 mg total) by mouth daily.     omeprazole 40 MG capsule  Commonly known as:  PRILOSEC  Take 1 capsule (40 mg total) by mouth daily. FOR STOMACH     spironolactone 12.5 mg Tabs tablet  Commonly known as:  ALDACTONE  Take 0.5 tablets (12.5 mg total) by mouth daily.       Allergies  Allergen Reactions  . Ibuprofen Hives       Follow-up Information   Follow up with Glori Bickers, MD On 09/02/2013. (11:40 am [facility] [place] ([facility]).)    Specialty:  Cardiology   Contact information:   [address] [place] [address] [phone]        The results of significant diagnostics from this hospitalization (including imaging, microbiology, ancillary and laboratory) are listed below for reference.    Significant Diagnostic Studies: Dg Chest 1 View  08/25/2013   CLINICAL DATA:  Post thoracentesis  EXAM: CHEST - 1 VIEW  COMPARISON:  CT chest dated 08/24/2013  FINDINGS: No pneumothorax is seen status post right thoracentesis.  Moderate right pleural effusion, decreased, with mild apical capping. Underlying compressive atelectasis of the right middle and lower lobes.  Left lung is essentially clear.  The heart is normal in size.  Left subclavian ICD.  IMPRESSION: No pneumothorax is seen status post right thoracentesis.  Moderate right pleural effusion.   Electronically Signed   By: Julian Hy M.D.   On: 08/25/2013 16:34   Dg Chest 2 View  08/24/2013   ADDENDUM REPORT: 08/24/2013 12:02  ADDENDUM: The first sentence of the Impression #1. Should state: Diffuse increased density within the right hemi thorax with a component appearing to track along the lateral superior aspect of the right chest wall has the appearance of a moderate to large right pleural effusion.   Electronically Signed   By: Margaree Mackintosh  M.D.   On: 08/24/2013 12:02   08/24/2013   CLINICAL DATA:  cOUGH, RIGHT RIB PAIN  EXAM: CHEST  2 VIEW  COMPARISON:  DG RIBS UNILATERAL*R* dated 08/24/2013; DG CHEST 2 VIEW dated 01/08/2013  FINDINGS: The cardiac silhouette is enlarged. A left chest wall AICD is identified lead tip projecting in the region the right ventricle. There is diffuse increased density within the right hemi thorax with a meniscal apex tracking along the superior lateral aspect of the right chest wall. This finding has the appearance of a moderate to large pleural effusion. There is increased density within the medial base of the aerated portion of the right upper lobe. Mild prominence of the interstitial markings identified. No further focal regions of consolidation or focal infiltrates are appreciated. The osseous structures are unremarkable.  IMPRESSION: 1. Diffuse increased density within the right hemi thorax appears to track  along the lateral superior aspect of the right chest wall says the appearance of a right pleural effusion. Underlying component of atelectasis versus infiltrate cannot be excluded. Underlying mass or nodule also cannot be excluded. Likely compressive atelectasis within the medial aspect of the aerated portion of the right lung. 2. Mild interstitial prominence may reflect a component of pulmonary vascular congestion. 3. No further focal regions of consolidation no focal infiltrates are appreciated. 4. These results will be called to the ordering clinician or representative by the Radiologist Assistant, and communication documented in the PACS Dashboard.  Electronically Signed: By: Margaree Mackintosh M.D. On: 08/24/2013 11:48   Dg Ribs Unilateral Right  08/24/2013   CLINICAL DATA:  RIB PAIN, COUGH  Is  EXAM: RIGHT RIBS - 2 VIEW  COMPARISON:  None.  FINDINGS: No fracture or other bone lesions are seen involving the ribs. There is diffuse increased density within the right hemi thorax described on recent chest radiograph.   IMPRESSION: No evidence of acute osseous abnormalities.   Electronically Signed   By: Margaree Mackintosh M.D.   On: 08/24/2013 11:49   Ct Chest W Contrast  08/24/2013   CLINICAL DATA:  Shortness of breath. Scoliosis. History of Wilms tumor.  EXAM: CT CHEST WITH CONTRAST  TECHNIQUE: Multidetector CT imaging of the chest was performed during intravenous contrast administration.  CONTRAST:  60m OMNIPAQUE IOHEXOL 300 MG/ML  SOLN  COMPARISON:  Chest radiographs 08/24/2013. Lumbar spine CT 08/29/2012. Mild height loss involving the T12-L3 vertebral bodies is unchanged.  FINDINGS: No enlarged axillary lymph nodes are noted. Small paratracheal and precarinal lymph nodes measure up to 8 mm in short axis. Subcarinal lymph nodes measure up to 1.1 cm in short axis. A left-sided single lead pacemaker is present with tip in the left ventricular apex. The heart is mildly enlarged.  There is a large right pleural effusion with associated compressive atelectasis resulting in near complete right middle and lower lobe atelectasis. There is a small left pleural effusion. Patchy ground-glass opacities are present in the left upper lobe.  The visualized upper portion of the abdomen demonstrate sequelae of prior left nephrectomy. Hypoplastic T12-L3 vertebral bodies are unchanged.  IMPRESSION: 1. Large right pleural effusion with marked compressive atelectasis of the right middle and lower lobes. 2. Small left pleural effusion. 3. Small mediastinal lymph nodes, nonspecific. 4. Patchy left upper lobe ground-glass opacities, nonspecific. Query infection.   Electronically Signed   By: ALogan Bores  On: 08/24/2013 15:31   [place]Thoracentesis Asp Pleural Space W/img Guide  08/25/2013   CLINICAL DATA:  Right pleural effusion, hypoxemia  EXAM: ULTRASOUND GUIDED RIGHT THORACENTESIS  COMPARISON:  CT chest dated 08/24/2013.  PROCEDURE: An ultrasound guided thoracentesis was thoroughly discussed with the patient and questions answered. The  benefits, risks, alternatives and complications were also discussed. The patient understands and wishes to proceed with the procedure. Written consent was obtained.  Ultrasound was performed to localize and mark an adequate pocket of fluid in the right chest. The area was then prepped and draped in the normal sterile fashion. 1% Lidocaine was used for local anesthesia. Under ultrasound guidance a 19 gauge Yueh catheter was introduced. Thoracentesis was performed. The catheter was removed and a dressing applied.  Complications:  None.  FINDINGS: A total of approximately 1280 mL of amber fluid was removed. A fluid sample was sent for laboratory analysis.  IMPRESSION: Successful ultrasound guided right thoracentesis yielding 1280 mL of pleural fluid.   Electronically Signed  By: Julian Hy M.D.   On: 08/25/2013 16:56    Microbiology: Recent Results (from the past 240 hour(s))  BODY FLUID CULTURE     Status: None   Collection Time    08/25/13  4:15 PM      Result Value Ref Range Status   Specimen Description PLEURAL FLUID   Final   Special Requests NONE   Final   Gram Stain     Final   Value: CYTOSPIN SLIDE WBC PRESENT,BOTH PMN AND MONONUCLEAR     NO ORGANISMS SEEN     Performed at [facility]     Performed at [facility]   Culture     Final   Value: NO GROWTH     Performed at [facility]   Report Status PENDING   Incomplete     Labs: Basic Metabolic Panel:  Recent Labs Lab 08/24/13 1000 08/24/13 1250 08/25/13 0507 08/26/13 0511  NA 131* 133* 132* 131*  K 4.0 3.6* 4.0 3.8  CL 92* 92* 94* 93*  CO2 29 30 26 27   GLUCOSE 140* 140* 118* 153*  BUN 7 7 10 16   CREATININE 0.72 0.67 0.79 0.97  CALCIUM 8.7 9.1 8.8 8.3*   Liver Function Tests:  Recent Labs Lab 08/24/13 1000 08/24/13 1250  AST 19 25  ALT 18 18  ALKPHOS 118* 132*  BILITOT 1.1 1.0  PROT 6.2 7.2  ALBUMIN 3.1* 2.8*   No results found for this basename: LIPASE, AMYLASE,  in the last  168 hours No results found for this basename: AMMONIA,  in the last 168 hours CBC:  Recent Labs Lab 08/24/13 1000 08/24/13 1250 08/25/13 0507  WBC 8.0 8.7 8.1  NEUTROABS 5.8 6.3  --   HGB 14.5 14.7 12.6*  HCT 41.8 43.5 37.1*  MCV 94.1 97.5 96.4  PLT 452* 382 413*   Cardiac Enzymes:  Recent Labs Lab 08/24/13 1250  TROPONINI <0.30   BNP: BNP (last 3 results)  Recent Labs  08/24/13 1250  PROBNP 6856.0*   CBG: No results found for this basename: GLUCAP,  in the last 168 hours     Signed:  Nita Sells  [facility] 08/26/2013, 1:46 PM

## 2013-08-27 LAB — PRESCRIPTION MONITORING PROFILE (13 PANEL)
AMPHETAMINE/METH: NEGATIVE ng/mL
Barbiturate Screen, Urine: NEGATIVE ng/mL
Benzodiazepine Screen, Urine: NEGATIVE ng/mL
Buprenorphine, Urine: NEGATIVE ng/mL
CREATININE, URINE: 428.35 mg/dL (ref >20.0)
Cannabinoid Scrn, Ur: NEGATIVE ng/mL
Cocaine Metabolites: NEGATIVE ng/mL
Fentanyl, Ur: NEGATIVE ng/mL
MEPERIDINE UR: NEGATIVE ng/mL
Methadone Screen, Urine: NEGATIVE ng/mL
Nitrites, Initial: NEGATIVE ug/mL
OXYCODONE SCRN UR: NEGATIVE ng/mL
PH URINE, INITIAL: 6.7 pH (ref 4.5–8.9)
PROPOXYPHENE: NEGATIVE ng/mL
TRAMADOL UR: NEGATIVE ng/mL

## 2013-08-27 LAB — OPIATES/OPIOIDS (LC/MS-MS)
Codeine Urine: NEGATIVE ng/mL
HYDROCODONE: 2719 ng/mL — AB
Heroin (6-AM), UR: NEGATIVE ng/mL
Hydromorphone: 1105 ng/mL — AB
Morphine Urine: NEGATIVE ng/mL
Norhydrocodone, Ur: 4448 ng/mL — AB
Noroxycodone, Ur: NEGATIVE ng/mL
OXYMORPHONE, URINE: NEGATIVE ng/mL
Oxycodone, ur: NEGATIVE ng/mL

## 2013-08-29 LAB — BODY FLUID CULTURE: CULTURE: NO GROWTH

## 2013-09-02 ENCOUNTER — Ambulatory Visit (HOSPITAL_COMMUNITY)
Admission: RE | Admit: 2013-09-02 | Discharge: 2013-09-02 | Disposition: A | Discharge status: Home / Self Care | Payer: Medicaid Other | Source: Ambulatory Visit | Attending: Adult Health | Admitting: Adult Health

## 2013-09-02 ENCOUNTER — Encounter (HOSPITAL_COMMUNITY): Payer: Self-pay

## 2013-09-02 ENCOUNTER — Ambulatory Visit (HOSPITAL_COMMUNITY)
Admission: RE | Admit: 2013-09-02 | Discharge: 2013-09-02 | Disposition: A | Discharge status: Home / Self Care | Payer: Medicaid Other | Source: Ambulatory Visit | Attending: Internal Medicine | Admitting: Internal Medicine

## 2013-09-02 ENCOUNTER — Other Ambulatory Visit: Payer: Self-pay

## 2013-09-02 ENCOUNTER — Ambulatory Visit (HOSPITAL_COMMUNITY)
Admission: RE | Admit: 2013-09-02 | Discharge: 2013-09-02 | Disposition: A | Discharge status: Home / Self Care | Payer: Medicaid Other | Source: Ambulatory Visit | Attending: Radiology | Admitting: Radiology

## 2013-09-02 VITALS — BP 108/88 | HR 110 | Ht 69.0 in | Wt 124.4 lb

## 2013-09-02 DIAGNOSIS — I5022 Chronic systolic (congestive) heart failure: Secondary | ICD-10-CM

## 2013-09-02 DIAGNOSIS — J90 Pleural effusion, not elsewhere classified: Secondary | ICD-10-CM | POA: Insufficient documentation

## 2013-09-02 DIAGNOSIS — I509 Heart failure, unspecified: Secondary | ICD-10-CM

## 2013-09-02 LAB — BASIC METABOLIC PANEL
BUN: 7 mg/dL (ref 6–23)
CALCIUM: 8.9 mg/dL (ref 8.4–10.5)
CHLORIDE: 93 meq/L — AB (ref 96–112)
CO2: 28 meq/L (ref 19–32)
CREATININE: 0.75 mg/dL (ref 0.50–1.35)
GFR calc Af Amer: >90 mL/min (ref >90)
GFR calc non Af Amer: >90 mL/min (ref >90)
GLUCOSE: 123 mg/dL — AB (ref 70–99)
Potassium: 3.9 mEq/L (ref 3.7–5.3)
Sodium: 133 mEq/L — ABNORMAL LOW (ref 137–147)

## 2013-09-02 MED ORDER — DIGOXIN 125 MCG PO TABS: 0.1250 mg | ORAL_TABLET | Freq: Every day | ORAL | Status: DC

## 2013-09-02 NOTE — Progress Notes (Signed)
Patient ID: [name], male   DOB: 1970-10-10, 43 y.o.   MRN: [id number]  Weight Range   Baseline proBNP    Primary Cardiologist: Dr Domenic Polite  HPI: [name] is 43 year old with history of NICM - norm cors 7510, chronic systolic heart failure EF 15%  08/2013, S/P ICD [employer], smoker, Wilms tumor s/p nephrectomy at age 62 had chemo radiation, scoliosis.    [facility] to [facility] with [facility]. Had large R pleural effusion.Thoracentesis 1.2 liters. Discharged on lisinopril 2.5 mg daily, lasix 20 mg twice a day, carvedilol 18.75mg  twice a day. Discharge weight 126 pounds.   He returns post hospital follow up. SOB with exertion. Denies PND/Orthopnea. SOB with steps and inclined areas. He can walk to the back of the grocery store but says he has to take his time. Requires assistance cleaning his house. Weight at home 123-124 pounds. Appetite ok. He has a 44 year old son and he has full time custody. He has mom, dad, sister, and brother that can help as needed. Compliant with medications.    ECHO 08/2013 EF 15% RV mildly dilated.   Labs 3.25.15 K 3.9 Creatinine 0.7 5  SH: Lives with his son 41 year old . Unemployed. Disability pending. Drinks one-two beers occasionally. Former smoker quit January 2015. Dips tobacco FH: Grandfather MI  ROS: All systems negative except as listed in HPI, PMH and Problem List.  Past Medical History  Diagnosis Date  . Cardiomyopathy, nonischemic     LVEF 20-25%, ICD in place  . Tobacco abuse   . Orthostatic hypotension   . Alcohol consumption heavy   . Scoliosis   . Fasting hyperglycemia   . Wilm's tumor     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 26 (XRT and chemo)  . Hyperlipidemia   . Chronic systolic heart failure   . Arthritis   . Chronic back pain   . Depression   . History of cardiac catheterization     Normal coronaries and right heart pressures 12/2010     Current Outpatient Prescriptions  Medication Sig Dispense Refill  . aspirin 81 MG tablet Take 81 mg by mouth  daily.        . calcium-vitamin D (OSCAL WITH D) 500-200 MG-UNIT per tablet Take 2 tablets by mouth every morning.       . carvedilol (COREG) 12.5 MG tablet Take 1.5 tablets (18.75 mg total) by mouth every morning.  45 tablet  6  . FLUoxetine (PROZAC) 20 MG capsule Take 1 capsule (20 mg total) by mouth every morning.  30 capsule  6  . furosemide (LASIX) 20 MG tablet Take 1 tablet (20 mg total) by mouth 2 (two) times daily.  60 tablet  0  . HYDROcodone-acetaminophen (NORCO) 7.5-325 MG per tablet Take 1 tablet by mouth 2 (two) times daily as needed.  60 tablet  0  . lisinopril (PRINIVIL,ZESTRIL) 2.5 MG tablet Take 1 tablet (2.5 mg total) by mouth daily.  30 tablet  0  . omeprazole (PRILOSEC) 40 MG capsule Take 1 capsule (40 mg total) by mouth daily. FOR STOMACH  30 capsule  3  . spironolactone (ALDACTONE) 12.5 mg TABS tablet Take 0.5 tablets (12.5 mg total) by mouth daily.  30 tablet  0   No current facility-administered medications for this encounter.     PHYSICAL EXAM: Filed Vitals:   09/02/13 1202  BP: 108/88  Pulse: 110  Height: 5\' 9"  (1.753 m)  Weight: 124 lb 6.4 oz (56.427 kg)  SpO2: 97%  General:  Thin. . No resp difficulty HEENT: normal Neck: supple. JVP 6-7 . Carotids 2+ bilaterally; no bruits. No lymphadenopathy or thryomegaly appreciated. Cor: PMI normal. Tachycardic Regular rate & rhythm. No rubs, gallops or murmurs. L upper chest ICD Lungs: Decreased breath sound on R 2/3 up  Abdomen: soft, nontender, nondistended. No hepatosplenomegaly. No bruits or masses. Good bowel sounds. Extremities: no cyanosis, clubbing, rash, edema Neuro: alert & orientedx3, cranial nerves grossly intact. Moves all 4 extremities w/o difficulty. Affect pleasant.  ASSESSMENT & PLAN: 1. Chronic Systolic HF NICM EF 50% [employer] ICD.  NYHA III. Volume status stable. Continue lasix 20 mg twice a day and 12.5 mg spiro daily. Continue carvedilol 18.75 mg twice a day. On lisinopril 2.5 mg  daily. - watch K closely as he has had hyperkalemia in past.  Add digoxin 0.125 mg daily.  Check BMET ---> K 3.9 Creatinine 0.75 Will need CPX soon after he improves.   2. R pleural effusion - Follow up CXR today.  Discussed with Dr Charlsie Merles. Will send IR for thoracentesis.   3. Dips tobacco  Follow up in 2-3 weeks   CLEGG,AMY NP-C  1:17 PM  Patient seen and examined with Darrick Grinder, NP. We discussed all aspects of the encounter. I agree with the assessment and plan as stated above. Difficult situation. He has severe LV dysfunction but functional status appears is reportedly stable though I suspect he may be under-reporting. On exam and by CXR he has large recurrent R pleural effusion. I have discussed with Dr. Prescott Gum. We will refer him to IR today for repeat thoracentesis and have him see Dr. Prescott Gum next week for Pleurex catheter. Suspect he will eventually need high-risk VATS pleurodesis. Agree with adding digoxin and lisinopril. Once effusion under control will need CPX test +/- RHC. He may need advanced therapies at some point but not sure he will qualify. The HF team will follow closely. Check BMET next week watching closely for recurrent hyperkalemia with ACE-I.   Total time spent 45 minutes. Over half that time spent discussing above.    Shavon Ashmore,MD 11:53 AM

## 2013-09-02 NOTE — Procedures (Addendum)
[place] right chest demonstrates a large pleural effusion with numerous septations/loculations, which is a marked change from previous US thora on 08/25/13. See Images for details. Successful US guided right thoracentesis. Yielded 1.2 L of clear amber colored fluid. Pt tolerated procedure well. No immediate complications.  Specimen was sent for labs. CXR ordered.  Ascencion Dike PA-C 09/02/2013 2:30 PM

## 2013-09-02 NOTE — Patient Instructions (Signed)
Follow up in 3-4 weeks  Take digoxin 0.125 mg daily  Do the following things EVERYDAY: 1) Weigh yourself in the morning before breakfast. Write it down and keep it in a log. 2) Take your medicines as prescribed 3) Eat low salt foods-Limit salt (sodium) to 2000 mg per day.  4) Stay as active as you can everyday 5) Limit all fluids for the day to less than 2 liters

## 2013-09-03 ENCOUNTER — Ambulatory Visit (HOSPITAL_COMMUNITY): Payer: Medicaid Other

## 2013-09-04 ENCOUNTER — Encounter (HOSPITAL_COMMUNITY): Payer: Self-pay | Admitting: Pharmacy Technician

## 2013-09-05 LAB — BODY FLUID CULTURE: Culture: NO GROWTH

## 2013-09-07 ENCOUNTER — Ambulatory Visit (INDEPENDENT_AMBULATORY_CARE_PROVIDER_SITE_OTHER): Payer: Medicaid Other | Admitting: Family Medicine

## 2013-09-07 ENCOUNTER — Encounter: Payer: Self-pay | Admitting: Family Medicine

## 2013-09-07 VITALS — BP 138/76 | HR 88 | Temp 98.7°F | Resp 14 | Ht 65.5 in | Wt 124.0 lb

## 2013-09-07 DIAGNOSIS — G4700 Insomnia, unspecified: Secondary | ICD-10-CM

## 2013-09-07 DIAGNOSIS — I509 Heart failure, unspecified: Secondary | ICD-10-CM

## 2013-09-07 DIAGNOSIS — I5022 Chronic systolic (congestive) heart failure: Secondary | ICD-10-CM

## 2013-09-07 DIAGNOSIS — J90 Pleural effusion, not elsewhere classified: Secondary | ICD-10-CM

## 2013-09-07 MED ORDER — ZOLPIDEM TARTRATE 5 MG PO TABS: 5.0000 mg | ORAL_TABLET | Freq: Every evening | ORAL | Status: DC | PRN

## 2013-09-07 NOTE — Assessment & Plan Note (Signed)
He's had 2 thoracentesis for his pleural effusion he is to have surgery this week. I will keep followup with cardiothoracic surgery and cardiology

## 2013-09-07 NOTE — Progress Notes (Signed)
Patient ID: [name], male   DOB: [birth year], 43 y.o.   MRN: [id number]   Subjective:    Patient ID: [name], male    DOB: 1970-10-23, 43 y.o.   MRN: [id number]  Patient presents for Hospital F/U  Here for hospital followup her last visit he was complaining of cough and some shortness of breath I sent him to haven't chest x-ray done which showed a large right-sided pleural effusion he was admitted and had thoracentesis to have this removed he still following up with cardiothoracic surgery as the pleural effusion continues to reoccur he does get symptoms when he tries to lay flat on his back. He is very anxious and has not slept well since the hospital admission he thinks that he will be able to breathe if he goes to sleep therefore has only had a couple of hours of sleep a night. He's requesting something to 8 in his sleep. He is due to have surgery this week because of the recurrent pleural effusion. His medications were reviewed. He does tell me that the Lasix 20 mg twice a day is too much for him and he feels to hydrate with this especially with the new medications which were reviewed.   Review Of Systems:  GEN- denies fatigue, fever, weight loss,weakness, recent illness HEENT- denies eye drainage, change in vision, nasal discharge, CVS- denies chest pain, palpitations RESP- +SOB, cough, wheeze ABD- denies N/V, change in stools, abd pain Neuro- denies headache, dizziness, syncope, seizure activity       Objective:    BP 138/76  Pulse 88  Temp(Src) 98.7 F (37.1 C) (Oral)  Resp 14  Ht 5' 5.5" (1.664 m)  Wt 124 lb (56.246 kg)  BMI 20.31 kg/m2 GEN- NAD, alert and oriented x3 HEENT- PERRL, EOMI, non injected sclera, pink conjunctiva, MMM, oropharynx clear Neck- Supple, no JVD CVS- RRR, no murmur RESP-decreased BS EXT- No edema Pulses- Radial 2+        Assessment & Plan:      Problem List Items Addressed This Visit   Pleural effusion     He's had 2  thoracentesis for his pleural effusion he is to have surgery this week. I will keep followup with cardiothoracic surgery and cardiology    Chronic systolic congestive heart failure     Right-sided pleural effusion continues to reoccur he is very poor systolic function he is currently taking Lasix 20 mg daily as he was feeling very dehydrated with the increased dose as well as the new Aldactone 12.5 mg. There was discussion about the digoxin been added however no prescription was sent he has a follow with cardiology tomorrow therefore I would not start this medication. I will go ahead and check his kidney and renal function due to his new medications     Other Visit Diagnoses   CHF (congestive heart failure)    -  Primary    Relevant Orders       Basic metabolic panel       Note: This dictation was prepared with Dragon dictation along with smaller phrase technology. Any transcriptional errors that result from this process are unintentional.

## 2013-09-07 NOTE — Assessment & Plan Note (Signed)
Right-sided pleural effusion continues to reoccur he is very poor systolic function he is currently taking Lasix 20 mg daily as he was feeling very dehydrated with the increased dose as well as the new Aldactone 12.5 mg. There was discussion about the digoxin been added however no prescription was sent he has a follow with cardiology tomorrow therefore I would not start this medication. I will go ahead and check his kidney and renal function due to his new medications

## 2013-09-07 NOTE — Pre-Procedure Instructions (Signed)
[name]  09/07/2013   Your procedure is scheduled on:  Wednesday, April 1.  Report to [facility], Main Entrance [name]" at 6:30 AM.  Call this number if you have problems the morning of surgery: [phone]   Remember:   Do not eat food or drink liquids after midnight.   Take these medicines the morning of surgery with A SIP OF WATER: carvedilol (COREG), omeprazole (PRILOSEC), FLUoxetine (PROZAC).               Take if needed:HYDROcodone-acetaminophen ([place]).    Do not wear jewelry, make-up or nail polish.  Do not wear lotions, powders, or perfumes.    Men may shave face and neck.  Do not bring valuables to the hospital.  [facility] is not responsible for any belongings or valuables.               Contacts, dentures or bridgework may not be worn into surgery.  Leave suitcase in the car. After surgery it may be brought to your room.  For patients admitted to the hospital, discharge time is determined by your treatment team.               Patients discharged the day of surgery will not be allowed to drive home.  Name and phone number of your driver: -   Special Instructions: -   Please read over the following fact sheets that you were given: Pain Booklet, Coughing and Deep Breathing and Surgical Site Infection Prevention

## 2013-09-07 NOTE — Patient Instructions (Signed)
We will check potassium today  F/U in 3 months

## 2013-09-07 NOTE — Assessment & Plan Note (Signed)
Given him a short course of Ambien to use think this is more anxiety related

## 2013-09-08 ENCOUNTER — Encounter: Payer: Self-pay | Admitting: Cardiothoracic Surgery

## 2013-09-08 ENCOUNTER — Encounter (HOSPITAL_COMMUNITY)
Admission: RE | Admit: 2013-09-08 | Discharge: 2013-09-08 | Disposition: A | Discharge status: Home / Self Care | Payer: Medicaid Other | Source: Ambulatory Visit | Attending: Cardiothoracic Surgery | Admitting: Cardiothoracic Surgery

## 2013-09-08 ENCOUNTER — Encounter (HOSPITAL_COMMUNITY): Payer: Self-pay

## 2013-09-08 ENCOUNTER — Institutional Professional Consult (permissible substitution) (INDEPENDENT_AMBULATORY_CARE_PROVIDER_SITE_OTHER): Payer: Medicaid Other | Admitting: Cardiothoracic Surgery

## 2013-09-08 VITALS — BP 119/85 | HR 120 | Temp 98.2°F | Resp 18 | Ht 69.0 in | Wt 124.5 lb

## 2013-09-08 VITALS — BP 125/89 | HR 118 | Resp 16 | Ht 69.0 in | Wt 125.0 lb

## 2013-09-08 DIAGNOSIS — J90 Pleural effusion, not elsewhere classified: Secondary | ICD-10-CM

## 2013-09-08 DIAGNOSIS — I5022 Chronic systolic (congestive) heart failure: Secondary | ICD-10-CM

## 2013-09-08 HISTORY — DX: Insomnia, unspecified: G47.00

## 2013-09-08 HISTORY — DX: Gastro-esophageal reflux disease without esophagitis: K21.9

## 2013-09-08 LAB — BASIC METABOLIC PANEL
BUN: 9 mg/dL (ref 6–23)
CO2: 25 meq/L (ref 19–32)
Calcium: 8.7 mg/dL (ref 8.4–10.5)
Chloride: 94 mEq/L — ABNORMAL LOW (ref 96–112)
Creat: 0.69 mg/dL (ref 0.50–1.35)
Glucose, Bld: 91 mg/dL (ref 70–99)
POTASSIUM: 4.5 meq/L (ref 3.5–5.3)
SODIUM: 131 meq/L — AB (ref 135–145)

## 2013-09-08 LAB — SURGICAL PCR SCREEN
MRSA, PCR: NEGATIVE
Staphylococcus aureus: NEGATIVE

## 2013-09-08 LAB — COMPREHENSIVE METABOLIC PANEL WITH GFR
ALT: 15 U/L (ref 0–53)
AST: 26 U/L (ref 0–37)
Albumin: 2.5 g/dL — ABNORMAL LOW (ref 3.5–5.2)
Alkaline Phosphatase: 193 U/L — ABNORMAL HIGH (ref 39–117)
BUN: 8 mg/dL (ref 6–23)
CO2: 26 meq/L (ref 19–32)
Calcium: 8.5 mg/dL (ref 8.4–10.5)
Chloride: 94 meq/L — ABNORMAL LOW (ref 96–112)
Creatinine, Ser: 0.76 mg/dL (ref 0.50–1.35)
GFR calc Af Amer: >90 mL/min
GFR calc non Af Amer: >90 mL/min
Glucose, Bld: 125 mg/dL — ABNORMAL HIGH (ref 70–99)
Potassium: 4.3 meq/L (ref 3.7–5.3)
Sodium: 134 meq/L — ABNORMAL LOW (ref 137–147)
Total Bilirubin: 0.6 mg/dL (ref 0.3–1.2)
Total Protein: 7.1 g/dL (ref 6.0–8.3)

## 2013-09-08 LAB — PROTIME-INR
INR: 1.22 (ref 0.00–1.49)
Prothrombin Time: 15.1 seconds (ref 11.6–15.2)

## 2013-09-08 LAB — CBC
HCT: 37.7 % — ABNORMAL LOW (ref 39.0–52.0)
Hemoglobin: 13.1 g/dL (ref 13.0–17.0)
MCH: 31.9 pg (ref 26.0–34.0)
MCHC: 34.7 g/dL (ref 30.0–36.0)
MCV: 91.7 fL (ref 78.0–100.0)
Platelets: 486 10*3/uL — ABNORMAL HIGH (ref 150–400)
RBC: 4.11 MIL/uL — ABNORMAL LOW (ref 4.22–5.81)
RDW: 14.4 % (ref 11.5–15.5)
WBC: 9.4 10*3/uL (ref 4.0–10.5)

## 2013-09-08 LAB — APTT: aPTT: 31 seconds (ref 24–37)

## 2013-09-08 MED ORDER — CEFUROXIME SODIUM 1.5 G IJ SOLR
1.5000 g | INTRAMUSCULAR | Status: AC
  Administered 2013-09-09: 1.5 g via INTRAVENOUS
  Filled 2013-09-08: qty 1.5

## 2013-09-08 NOTE — Progress Notes (Signed)
Cardiologist is with [facility]  Multiple echo reports in epic with most recent one in 2015  Heart cath in epic from 2012  Denies ever having an echo  Medical Md is [facility]  EKG in epic from 08-24-13

## 2013-09-08 NOTE — Pre-Procedure Instructions (Signed)
[name]  09/08/2013   Your procedure is scheduled on:  Wed, April 1 @ 8:30 AM  Report to [name] Entrance A  at 6:30 AM.  Call this number if you have problems the morning of surgery: [phone]   Remember:   Do not eat food or drink liquids after midnight.   Take these medicines the morning of surgery with A SIP OF WATER: Carvedilol(Coreg),Digoxin(Lanoxin),Prozac(Fluoxetine),Pain Pill(if needed),and Omeprazole(Prilosec)               No Goody's,BC's,Aleve,Ibuprofen,Fish Oil,or any Herbal Medications    Do not wear jewelry  Do not wear lotions, powders, or colognes. You may wear deodorant.  Men may shave face and neck.  Do not bring valuables to the hospital.  [facility] is not responsible                  for any belongings or valuables.               Contacts, dentures or bridgework may not be worn into surgery.  Leave suitcase in the car. After surgery it may be brought to your room.  For patients admitted to the hospital, discharge time is determined by your                treatment team.               Patients discharged the day of surgery will not be allowed to drive  home.    Special Instructions:  [name] - Preparing for Surgery  Before surgery, you can play an important role.  Because skin is not sterile, your skin needs to be as free of germs as possible.  You can reduce the number of germs on you skin by washing with CHG (chlorahexidine gluconate) soap before surgery.  CHG is an antiseptic cleaner which kills germs and bonds with the skin to continue killing germs even after washing.  Please DO NOT use if you have an allergy to CHG or antibacterial soaps.  If your skin becomes reddened/irritated stop using the CHG and inform your nurse when you arrive at Short Stay.  Do not shave (including legs and underarms) for at least 48 hours prior to the first CHG shower.  You may shave your face.  Please follow these instructions carefully:   1.  Shower with CHG  Soap the night before surgery and the                                morning of Surgery.  2.  If you choose to wash your hair, wash your hair first as usual with your       normal shampoo.  3.  After you shampoo, rinse your hair and body thoroughly to remove the                      Shampoo.  4.  Use CHG as you would any other liquid soap.  You can apply chg directly       to the skin and wash gently with scrungie or a clean washcloth.  5.  Apply the CHG Soap to your body ONLY FROM THE NECK DOWN.        Do not use on open wounds or open sores.  Avoid contact with your eyes,       ears, mouth and genitals (private parts).  Wash genitals (private  parts)       with your normal soap.  6.  Wash thoroughly, paying special attention to the area where your surgery        will be performed.  7.  Thoroughly rinse your body with warm water from the neck down.  8.  DO NOT shower/wash with your normal soap after using and rinsing off       the CHG Soap.  9.  Pat yourself dry with a clean towel.            10.  Wear clean pajamas.            11.  Place clean sheets on your bed the night of your first shower and do not        sleep with pets.  Day of Surgery  Do not apply any lotions/deoderants the morning of surgery.  Please wear clean clothes to the hospital/surgery center.     Please read over the following fact sheets that you were given: Pain Booklet, Coughing and Deep Breathing, MRSA Information and Surgical Site Infection Prevention

## 2013-09-08 NOTE — Progress Notes (Signed)
PCP is [name], MD Referring Provider is [name], Shaune Pascal, MD  No chief complaint on file.  patient examined, x-rays and echocardiogram reviewed  HPI: 43 year old Caucasian male recently diagnosed with acute on chronic CHF from nonischemic cardiomyopathy. His ejection fraction is 15%. Patient has been treated medically through the advanced heart failure clinic. He has required 2 right thoracenteses for recurrent right pleural effusion secondary to his chronic heart failure. Cytology and cultures have been negative. Because of the recurring nature of the effusion a right Pleurx catheter has been recommended to  help improve his symptomatic shortness of breath.  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 His current BUN creatinine remained normal. He has been in sinus rhythm. Echocardiogram shows moderate TR with pulmonary hypertension. There is no significant pericardial effusion.  Past Medical History  Diagnosis Date  . Cardiomyopathy, nonischemic     LVEF 20-25%, ICD in place  . Tobacco abuse   . Orthostatic hypotension   . Alcohol consumption heavy   . Scoliosis   . Fasting hyperglycemia   . Wilm's tumor     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 56 (XRT and chemo)  . Hyperlipidemia   . Chronic systolic heart failure   . Arthritis   . Chronic back pain   . Depression   . History of cardiac catheterization     Normal coronaries and right heart pressures 12/2010     Past Surgical History  Procedure Laterality Date  . Nephrectomy  36 yrs ago    Wilms Tumor  . Icd  05/25/2011    [employer] End[employer]ak [facility] lead/[employer] single chamber device  . Carpal tunnel release Right 01/09/2013    Procedure: CARPAL TUNNEL RELEASE;  Surgeon: Winfield Cunas, MD;  Location: [facility];  Service: Neurosurgery;  Laterality: Right;  RIGHT carpal tunnel release  . Ulnar nerve transposition Right 01/09/2013    Procedure: ULNAR NERVE DECOMPRESSION/TRANSPOSITION;  Surgeon: Winfield Cunas, MD;  Location: [facility];  Service: Neurosurgery;  Laterality: Right;  RIGHT ulnar nerve decompression  . Carpal tunnel release Left 01/30/2013    Procedure: LEFT CARPAL TUNNEL RELEASE;  Surgeon: Winfield Cunas, MD;  Location: [facility];  Service: Neurosurgery;  Laterality: Left;  LEFT Carpal Tunnel release    No family history on file.  Social History History  Substance Use Topics  . Smoking status: Former Smoker -- 0.25 packs/day for 20 years    Types: Cigarettes    Quit date: 06/25/2013  . Smokeless tobacco: Current User    Types: Snuff, Chew  . Alcohol Use: Yes    Current Outpatient Prescriptions  Medication Sig Dispense Refill  . aspirin 81 MG tablet Take 81 mg by mouth daily.        . calcium-vitamin D (OSCAL WITH D) 500-200 MG-UNIT per tablet Take 2 tablets by mouth every morning.       . carvedilol (COREG) 12.5 MG tablet Take 1.5 tablets (18.75 mg total) by mouth every morning.  45 tablet  6  . FLUoxetine (PROZAC) 20 MG capsule Take 1 capsule (20 mg total) by mouth every morning.  30 capsule  6  . furosemide (LASIX) 20 MG tablet Take 1 tablet (20 mg total) by mouth 2 (two) times daily.  60 tablet  0  . HYDROcodone-acetaminophen (NORCO) 7.5-325 MG per tablet Take 1 tablet by mouth 2 (two) times daily as needed (pain).      Marland Kitchen lisinopril (PRINIVIL,ZESTRIL) 2.5 MG tablet  Take 1 tablet (2.5 mg total) by mouth daily.  30 tablet  0  . omeprazole (PRILOSEC) 40 MG capsule Take 1 capsule (40 mg total) by mouth daily. FOR STOMACH  30 capsule  3  . spironolactone (ALDACTONE) 25 MG tablet Take 12.5 mg by mouth daily.      Marland Kitchen zolpidem (AMBIEN) 5 MG tablet Take 1 tablet (5 mg total) by mouth at bedtime as needed for sleep.  15 tablet  1   No current facility-administered medications for this visit.    Allergies  Allergen Reactions  . Ibuprofen Hives    Review of Systems The patient has had no recurrence of his Wilms tumor The patient had a AICD placed in 2015 but has  not required a shock The patient was admitted for heart failure at  [facility] the diagnosis of non-ischemic cardiomyopathy was made The patient is a former smoker-quit January 2015, he drinks occasional beer The patient denies fever or weight loss. The patient has a 64 year old son formally provides full custody. He is unemployed with disability pending.  There were no vitals taken for this visit. Physical Exam Middle-aged male in no acute distress HEENT normocephalic dentition adequate Neck with mild to moderate JVD, no adenopathy no carotid bruit Lungs-diminished breath sounds on right clear on left Cardiac regular rhythm without gallop or murmur Abdomen soft nontender without pulsatile mass Extremity no clubbing cyanosis or edema or tenderness Neuro no focal motor deficit  Diagnostic Tests: CT scan of chest and last chest x-ray reviewed demonstrating large pleural effusion with compressive atelectasis of right lung  Impression: Because patient has had recurrent right effusion from his heart failure a Pleurx catheter is appropriate. I would not recommend general anesthesia for VATS-talc pleurodesis at this time due to poorly controlled heart failure and severe LV dysfunction  Plan: Right Pleurx catheter under local anesthesia and IV conscious sedation in the OR tomorrow. Indications procedure and risk discussed with patient.

## 2013-09-08 NOTE — Progress Notes (Signed)
Anesthesia Chart Reive: Patient is a 43 year old male scheduled for insertion of a right chest Pleurx catheter tomorrow by Dr. Prescott Gum. Patient has non-ischemic CM s/p ACID, CHF with EF 15% and has recently required two right thoracenteses for recurrent right pleural effusion related to CHF. Cardiologist Dr. Haroldine Laws recommended Pleurx catheter and referred patient to Dr. Prescott Gum.  According to Dr. Lucianne Lei Trigt's note from this morning, "I would not recommend general anesthesia for VATS-talc pleurodesis at this time due to poorly controlled heart failure and severe LV dysfunction. Plan: Right Pleurx catheter under local anesthesia and IV conscious sedation in the OR tomorrow."  History includes former smoker (quit in 06/2013), non-ischemic CM, chronic systolic CHF, [employer] ICD 05/2011, scoliosis, Wilm's tumor s/p nephrectomy ~ age 34, HLD, GERD, depression, heavy ETOH use (sober since 06/2013), elevated liver enzymes, elevated fasting glucose without diagnosis of DM, bilateral CTR 01/2013. PCP is Dr. Idamae Lusher. Primary cardiologist is Dr. Lattie Haw, EP is Dr. Lovena Le. He is also followed by Dr. Glori Bickers at the [facility].  His former primary cardiologist was Dr. Lattie Haw, now listed as Dr. Domenic Polite.  EP cardiologist is Dr. Lovena Le. According to Dr. Clayborne Dana 09/02/13 noted, "We will refer him to IR today for repeat thoracentesis and have him see Dr. Prescott Gum next week for Pleurex catheter. Suspect he will eventually need high-risk VATS pleurodesis. Agree with adding digoxin and lisinopril. Once effusion under control will need CPX test +/- RHC. He may need advanced therapies at some point but not sure he will qualify. The HF team will follow closely."  EKG on 08/24/13 showed ST @ 118, possible LAE, RAD, non-specific T wave abnormality.   Echo on 08/25/13 showed: Upper normal LV chamber size with LVEF approximately 15%, diffuse hypokinesis with some regional variation, restrictive diastolic  filling pattern. Noninvasive parameters consistent with low cardiac output. Moderate to severe left atrial enlargement. Trivial mitral regurgitation. Mild RV dilatation with mldly to moderately reduced contraction. Device wire in the right heart. Moderate tricuspid regurgitation with PASP 50 mmHg. Mild pulmonic regurgitation. (Previous EF 20-25% on 08/26/12.)  Cardiac cath on 01/03/11 showed:  1. No angiographic evidence of coronary artery disease.  2. Normal right heart pressures.  3. Nonischemic cardiomyopathy with severe left ventricular systolic dysfunction.   CXR on 09/08/13 showed: 1. The findings are consistent with increasing volume of pleural fluid on the right. Right basilar atelectasis or pneumonia is suspected.  2. The left lung is hyperinflated consistent with COPD. New minimal increased density at the lung bases present consistent with atelectasis or small amounts of pleural fluid.  3. The appearance of the cardiac silhouette and pulmonary vascularity suggests low-grade compensated CHF.   Preoperative labs noted.  Patient with significant cardiac history, but cardiology is aware of plans and need for Pleurx catheter.  Dr. Prescott Gum would like procedure to be done with local and MAC. Further evaluation by his assigned anesthesiologist on the day of surgery to discuss the definitive anesthesia plan.  George Hugh [facility] Stay [facility]/Anesthesiology Phone [phone] 09/08/2013 5:55 PM

## 2013-09-08 NOTE — Progress Notes (Signed)
Taking Lisinopril to protect his kidneys

## 2013-09-08 NOTE — Progress Notes (Addendum)
Rep with [employer] called and notified of pt surgery at 0830 and the device may interfere with procedure.Per Corene Cornea this device can use a magnet without permanent change is anesthesia is good with that if not he will be around

## 2013-09-09 ENCOUNTER — Ambulatory Visit (HOSPITAL_COMMUNITY): Payer: Medicaid Other

## 2013-09-09 ENCOUNTER — Encounter (HOSPITAL_COMMUNITY)
Admission: RE | Disposition: A | Discharge status: Home / Self Care | Payer: Self-pay | Source: Ambulatory Visit | Attending: Cardiothoracic Surgery

## 2013-09-09 ENCOUNTER — Encounter (HOSPITAL_COMMUNITY): Payer: Medicaid Other | Admitting: Vascular Surgery

## 2013-09-09 ENCOUNTER — Ambulatory Visit (HOSPITAL_COMMUNITY)
Admission: RE | Admit: 2013-09-09 | Discharge: 2013-09-09 | Disposition: A | Discharge status: Home / Self Care | Payer: Medicaid Other | Source: Ambulatory Visit | Attending: Cardiothoracic Surgery | Admitting: Cardiothoracic Surgery

## 2013-09-09 ENCOUNTER — Ambulatory Visit (HOSPITAL_COMMUNITY): Payer: Medicaid Other | Admitting: Certified Registered Nurse Anesthetist

## 2013-09-09 ENCOUNTER — Encounter (HOSPITAL_COMMUNITY): Payer: Self-pay | Admitting: *Deleted

## 2013-09-09 DIAGNOSIS — J90 Pleural effusion, not elsewhere classified: Secondary | ICD-10-CM | POA: Insufficient documentation

## 2013-09-09 DIAGNOSIS — K219 Gastro-esophageal reflux disease without esophagitis: Secondary | ICD-10-CM | POA: Insufficient documentation

## 2013-09-09 DIAGNOSIS — I428 Other cardiomyopathies: Secondary | ICD-10-CM | POA: Insufficient documentation

## 2013-09-09 DIAGNOSIS — I509 Heart failure, unspecified: Secondary | ICD-10-CM | POA: Insufficient documentation

## 2013-09-09 HISTORY — PX: CHEST TUBE INSERTION: SHX231

## 2013-09-09 SURGERY — INSERTION, PLEURAL DRAINAGE CATHETER
Anesthesia: Monitor Anesthesia Care | Site: Chest | Laterality: Right

## 2013-09-09 MED ORDER — MIDAZOLAM HCL 5 MG/5ML IJ SOLN
INTRAMUSCULAR | Status: DC | PRN
  Administered 2013-09-09: 1 mg via INTRAVENOUS

## 2013-09-09 MED ORDER — ONDANSETRON HCL 4 MG/2ML IJ SOLN: 4.0000 mg | Freq: Once | INTRAMUSCULAR | Status: DC | PRN

## 2013-09-09 MED ORDER — 0.9 % SODIUM CHLORIDE (POUR BTL) OPTIME
TOPICAL | Status: DC | PRN
  Administered 2013-09-09: 1000 mL

## 2013-09-09 MED ORDER — EPHEDRINE SULFATE 50 MG/ML IJ SOLN
INTRAMUSCULAR | Status: AC
  Filled 2013-09-09: qty 1

## 2013-09-09 MED ORDER — LIDOCAINE HCL (PF) 1 % IJ SOLN
INTRAMUSCULAR | Status: AC
  Filled 2013-09-09: qty 30

## 2013-09-09 MED ORDER — FENTANYL CITRATE 0.05 MG/ML IJ SOLN
INTRAMUSCULAR | Status: DC | PRN
Start: 2013-09-09 — End: 2013-09-09
  Administered 2013-09-09 (×2): 25 ug via INTRAVENOUS

## 2013-09-09 MED ORDER — PROPOFOL 10 MG/ML IV BOLUS
INTRAVENOUS | Status: AC
  Filled 2013-09-09: qty 20

## 2013-09-09 MED ORDER — LIDOCAINE HCL (CARDIAC) 20 MG/ML IV SOLN
INTRAVENOUS | Status: DC | PRN
  Administered 2013-09-09: 50 mg via INTRAVENOUS

## 2013-09-09 MED ORDER — LACTATED RINGERS IV SOLN
INTRAVENOUS | Status: DC | PRN
  Administered 2013-09-09: 08:00:00 via INTRAVENOUS

## 2013-09-09 MED ORDER — LIDOCAINE HCL (CARDIAC) 20 MG/ML IV SOLN
INTRAVENOUS | Status: AC
  Filled 2013-09-09: qty 5

## 2013-09-09 MED ORDER — ONDANSETRON HCL 4 MG/2ML IJ SOLN
INTRAMUSCULAR | Status: DC | PRN
  Administered 2013-09-09: 4 mg via INTRAVENOUS

## 2013-09-09 MED ORDER — LIDOCAINE HCL 1 % IJ SOLN
INTRAMUSCULAR | Status: DC | PRN
  Administered 2013-09-09: 9 mL

## 2013-09-09 MED ORDER — ROCURONIUM BROMIDE 50 MG/5ML IV SOLN
INTRAVENOUS | Status: AC
  Filled 2013-09-09: qty 1

## 2013-09-09 MED ORDER — PROPOFOL INFUSION 10 MG/ML OPTIME
INTRAVENOUS | Status: DC | PRN
  Administered 2013-09-09: 25 ug/kg/min via INTRAVENOUS

## 2013-09-09 MED ORDER — SUCCINYLCHOLINE CHLORIDE 20 MG/ML IJ SOLN
INTRAMUSCULAR | Status: AC
  Filled 2013-09-09: qty 1

## 2013-09-09 MED ORDER — FENTANYL CITRATE 0.05 MG/ML IJ SOLN
INTRAMUSCULAR | Status: AC
  Filled 2013-09-09: qty 5

## 2013-09-09 MED ORDER — ARTIFICIAL TEARS OP OINT
TOPICAL_OINTMENT | OPHTHALMIC | Status: AC
  Filled 2013-09-09: qty 3.5

## 2013-09-09 MED ORDER — STERILE WATER FOR INJECTION IJ SOLN
INTRAMUSCULAR | Status: AC
  Filled 2013-09-09: qty 10

## 2013-09-09 MED ORDER — MIDAZOLAM HCL 2 MG/2ML IJ SOLN
INTRAMUSCULAR | Status: AC
  Filled 2013-09-09: qty 2

## 2013-09-09 MED ORDER — PROPOFOL INFUSION 10 MG/ML OPTIME: INTRAVENOUS | Status: DC | PRN

## 2013-09-09 MED ORDER — HYDROMORPHONE HCL PF 1 MG/ML IJ SOLN: 0.2500 mg | INTRAMUSCULAR | Status: DC | PRN

## 2013-09-09 MED ORDER — ONDANSETRON HCL 4 MG/2ML IJ SOLN
INTRAMUSCULAR | Status: AC
  Filled 2013-09-09: qty 2

## 2013-09-09 SURGICAL SUPPLY — 28 items
CANISTER SUCTION 2500CC (MISCELLANEOUS) ×3 IMPLANT
COVER SURGICAL LIGHT HANDLE (MISCELLANEOUS) ×3 IMPLANT
DERMABOND ADVANCED (GAUZE/BANDAGES/DRESSINGS) ×2
DERMABOND ADVANCED .7 DNX12 (GAUZE/BANDAGES/DRESSINGS) ×1 IMPLANT
DRAPE C-ARM 42X72 X-RAY (DRAPES) ×3 IMPLANT
DRAPE LAPAROSCOPIC ABDOMINAL (DRAPES) ×3 IMPLANT
DRSG OPSITE 6X11 MED (GAUZE/BANDAGES/DRESSINGS) ×3 IMPLANT
GLOVE ORTHO TXT STRL SZ7.5 (GLOVE) ×6 IMPLANT
GOWN STRL REUS W/ TWL LRG LVL3 (GOWN DISPOSABLE) ×2 IMPLANT
GOWN STRL REUS W/TWL LRG LVL3 (GOWN DISPOSABLE) ×4
KIT BASIN OR (CUSTOM PROCEDURE TRAY) ×3 IMPLANT
KIT PLEURX DRAIN CATH 1000ML (MISCELLANEOUS) ×3 IMPLANT
KIT PLEURX DRAIN CATH 15.5FR (DRAIN) ×3 IMPLANT
KIT ROOM TURNOVER OR (KITS) ×3 IMPLANT
NEEDLE HYPO 25GX1X1/2 BEV (NEEDLE) ×3 IMPLANT
NS IRRIG 1000ML POUR BTL (IV SOLUTION) ×3 IMPLANT
PACK GENERAL/GYN (CUSTOM PROCEDURE TRAY) ×3 IMPLANT
PAD ARMBOARD 7.5X6 YLW CONV (MISCELLANEOUS) ×6 IMPLANT
SET DRAINAGE LINE (MISCELLANEOUS) IMPLANT
SPONGE GAUZE 4X4 12PLY STER LF (GAUZE/BANDAGES/DRESSINGS) ×3 IMPLANT
SUT ETHILON 3 0 PS 1 (SUTURE) ×3 IMPLANT
SUT SILK 2 0 SH (SUTURE) ×3 IMPLANT
SUT VIC AB 3-0 SH 27 (SUTURE) ×2
SUT VIC AB 3-0 SH 27X BRD (SUTURE) ×1 IMPLANT
SYR CONTROL 10ML LL (SYRINGE) ×3 IMPLANT
TOWEL OR 17X24 6PK STRL BLUE (TOWEL DISPOSABLE) ×3 IMPLANT
TOWEL OR 17X26 10 PK STRL BLUE (TOWEL DISPOSABLE) ×3 IMPLANT
WATER STERILE IRR 1000ML POUR (IV SOLUTION) ×3 IMPLANT

## 2013-09-09 NOTE — Anesthesia Procedure Notes (Signed)
Procedure Name: MAC Performed by: Maryland Pink Pre-anesthesia Checklist: Patient identified, Emergency Drugs available, Suction available, Patient being monitored and Timeout performed Patient Re-evaluated:Patient Re-evaluated prior to inductionOxygen Delivery Method: Simple face mask Preoxygenation: Pre-oxygenation with 100% oxygen Placement Confirmation: positive ETCO2 Dental Injury: Teeth and Oropharynx as per pre-operative assessment

## 2013-09-09 NOTE — Anesthesia Postprocedure Evaluation (Signed)
  Anesthesia Post-op Note  Patient: [name]  Procedure(s) Performed: Procedure(s): INSERTION PLEURAL DRAINAGE CATHETER (Right)  Patient Location: PACU  Anesthesia Type:MAC  Level of Consciousness: awake, alert , oriented and patient cooperative  Airway and Oxygen Therapy: Patient Spontanous Breathing  Post-op Pain: mild  Post-op Assessment: Post-op Vital signs reviewed, Patient's Cardiovascular Status Stable, Respiratory Function Stable, Patent Airway, No signs of Nausea or vomiting and Pain level controlled  Post-op Vital Signs: stable  Complications: No apparent anesthesia complications

## 2013-09-09 NOTE — Transfer of Care (Signed)
Immediate Anesthesia Transfer of Care Note  Patient: [name]  Procedure(s) Performed: Procedure(s): INSERTION PLEURAL DRAINAGE CATHETER (Right)  Patient Location: PACU  Anesthesia Type:MAC  Level of Consciousness: awake, alert  and oriented  Airway & Oxygen Therapy: Patient Spontanous Breathing, placed on nasal cannula O2 sat on room air 89%  Post-op Assessment: Report given to PACU RN, Post -op Vital signs reviewed and stable and Patient moving all extremities X 4  Post vital signs: Reviewed and stable  Complications: No apparent anesthesia complications

## 2013-09-09 NOTE — Brief Op Note (Signed)
09/09/2013  9:25 AM  PATIENT:  [name]  43 y.o. male  PRE-OPERATIVE DIAGNOSIS:  pleural effusion  POST-OPERATIVE DIAGNOSIS:  pleural effusion  PROCEDURE:  Procedure(s): INSERTION PLEURAL DRAINAGE CATHETER (Right)  SURGEON:  Surgeon(s) and Role:    * Ivin Poot, MD - Primary  PHYSICIAN ASSISTANT:   ASSISTANTS: none   ANESTHESIA:   local, iv conscious sedation monitored by anesthesia  EBL:  Total I/O In: 500 [I.V.:500] Out: 800 [Other:800]  BLOOD ADMINISTERED:none  DRAINS: pleurex catheter- 850 cc drained  LOCAL MEDICATIONS USED:  XYLOCAINE   SPECIMEN:  No Specimen  DISPOSITION OF SPECIMEN:  N/A  COUNTS:  YES  TOURNIQUET:  * No tourniquets in log *  DICTATION: .Dragon Dictation  PLAN OF CARE: Discharge to home after PACU  PATIENT DISPOSITION:  Short Stay   Delay start of Pharmacological VTE agent (>24hrs) due to surgical blood loss or risk of bleeding: yes

## 2013-09-09 NOTE — Anesthesia Preprocedure Evaluation (Signed)
Anesthesia Evaluation  Patient identified by MRN, date of birth, ID band Patient awake    Reviewed: Allergy & Precautions, H&P , Patient's Chart, lab work & pertinent test results  Airway       Dental   Pulmonary former smoker,          Cardiovascular +CHF + Cardiac Defibrillator     Neuro/Psych    GI/Hepatic GERD-  ,(+)     substance abuse  alcohol use,   Endo/Other    Renal/GU Renal disease     Musculoskeletal   Abdominal   Peds  Hematology   Anesthesia Other Findings   Reproductive/Obstetrics                           Anesthesia Physical Anesthesia Plan  ASA: III  Anesthesia Plan: MAC   Post-op Pain Management:    Induction: Intravenous  Airway Management Planned: Simple Face Mask  Additional Equipment:   Intra-op Plan:   Post-operative Plan:   Informed Consent: I have reviewed the patients History and Physical, chart, labs and discussed the procedure including the risks, benefits and alternatives for the proposed anesthesia with the patient or authorized representative who has indicated his/her understanding and acceptance.     Plan Discussed with:   Anesthesia Plan Comments:         Anesthesia Quick Evaluation

## 2013-09-09 NOTE — Progress Notes (Signed)
The patient was examined and preop studies reviewed. There has been no change from the prior exam and the patient is ready for surgery.   Plan Right Pleurx catheter placement on T Hellmann today

## 2013-09-09 NOTE — Progress Notes (Signed)
Dr Lawson Fiscal visited pt at bedside

## 2013-09-09 NOTE — OR Nursing (Signed)
Case Management was notified of Pleurex catheter placement to arrange for follow up.

## 2013-09-09 NOTE — Op Note (Signed)
NAMEMARL, SEAGO NO.:  [id number]  MEDICAL RECORD NO.:  [id number]  LOCATION:  [facility]                         FACILITY:  [facility]  PHYSICIAN:  Ivin Poot, M.D.  DATE OF BIRTH:  [birth year]  DATE OF PROCEDURE:  09/09/2013 DATE OF DISCHARGE:  09/09/2013                              OPERATIVE REPORT   OPERATION:  Placement of right Pleurx catheter.  PREOPERATIVE DIAGNOSIS:  Recurrent right nonmalignant pleural effusion.  POSTOPERATIVE DIAGNOSIS:  Recurrent right nonmalignant pleural effusion.  SURGEON:  Ivin Poot, M.D.  ANESTHESIA:  Local 1% lidocaine with intravenous conscious sedation, monitored by anesthesia.  INDICATIONS:  The patient is 43 years old.  He has recently diagnosed nonischemic dilated cardiomyopathy.  He has had 2 thoracentesis procedures in February due to recurrent right pleural effusion from his heart failure.  The fusion has been negative culture and negative cytology.  The effusion has recurred and his cardiologist has recommended a procedure for his pleural effusion.  Prior to surgery, I examined the patient in the office and reviewed the procedure of right Pleurx catheter.  I felt that this would be the best initial option.  Because of the patient's low EF of 10-15%, and his recent admission for decompensated heart failure.  I discussed the location of the surgical incisions, the use of local and IV sedation anesthesia, and the expected postoperative recovery.  I discussed the fact that catheter would stay indwelling with home health nursing draining the catheter on a Monday, Wednesday, and Friday schedule.  I discussed the risks of the procedure including risks of bleeding, infection, recurrent effusion.  He demonstrated his understanding and agreed to proceed with surgery under what I felt was an informed consent.  OPERATIVE PROCEDURE:  The patient was brought to the operating room and placed supine on the operating  table.  The right shoulder had been previously marked.  The patient was prepped and draped in a sterile field.  A proper time-out was performed.  A 1% local lidocaine was infiltrated in the anterior axillary line at the fifth interspace.  A 2nd area of local infiltration was in the midclavicular line at the right costal margin.  A small incision was made at the upper point of local anesthesia.  The right pleural effusion was localized with a needle and then through the sheath, a guidewire was positioned in the right pleural space dependently.  This was confirmed under C-arm fluoroscopy.  A 2nd incision was made at the lower costal margin, which had been anesthetized and an incision was made.  The catheter was tunneled from the lower to the upper incision.  Over the guidewire dilators, the tear-away sheath were inserted.  Over the guidewire into the tearaway sheath, the PleurX catheter was inserted into the right pleural space.  Appropriate location was documented by C-arm fluoroscopy.  The 2 incisions were then closed and the Pleurx secured to the skin.  Sterile dressings were applied.  A follow up chest x-ray in the OR demonstrated no pneumothorax with good drainage of the right pleural effusion.  Approximately 900 mL of clear- yellow fluid was removed.  The patient then returned to recovery room in  stable condition.     Ivin Poot, M.D.     PV/MEDQ  D:  09/09/2013  T:  09/09/2013  Job:  [id number]  cc:   Dr. Tempie Hoist

## 2013-09-09 NOTE — Preoperative (Signed)
Beta Blockers   Reason not to administer Beta Blockers:Not Applicable 

## 2013-09-10 ENCOUNTER — Encounter (HOSPITAL_COMMUNITY): Payer: Self-pay | Admitting: Cardiothoracic Surgery

## 2013-09-10 NOTE — Care Management Note (Signed)
    Page 1 of 1   09/09/2013     4:30:18 PM   CARE MANAGEMENT NOTE 09/09/2013  Patient:  [name], [name]   Account Number:  [id number]  Date Initiated:  09/09/2013  Documentation initiated by:  Stella Bortle  Subjective/Objective Assessment:     Action/Plan:   Anticipated DC Date:  09/09/2013   Anticipated DC Plan:  [place]         Choice offered to / List presented to:  C-1 Patient      DME agency  [place] arranged  HH-1 RN      Status of service:   Medicare Important Message given?   (If response is "NO", the following Medicare IM given date fields will be blank) Date Medicare IM given:   Date Additional Medicare IM given:    Discharge Disposition:    Per UR Regulation:    If discussed at Long Length of Stay Meetings, dates discussed:    Comments:  Patient underwent placement of pluerx catheter 09/09/13. Home health arranged with [facility] to start 09/11/13. Patient was discharged with one case of canisters and instructions to make contact with [facility] if he does not receive a phone call in the next few days from [place].

## 2013-09-24 ENCOUNTER — Encounter (HOSPITAL_COMMUNITY): Payer: Self-pay

## 2013-09-24 ENCOUNTER — Ambulatory Visit (HOSPITAL_COMMUNITY)
Admission: RE | Admit: 2013-09-24 | Discharge: 2013-09-24 | Disposition: A | Discharge status: Home / Self Care | Payer: Medicaid Other | Source: Ambulatory Visit | Attending: Internal Medicine | Admitting: Internal Medicine

## 2013-09-24 ENCOUNTER — Encounter (HOSPITAL_COMMUNITY): Payer: Self-pay | Admitting: Internal Medicine

## 2013-09-24 VITALS — BP 102/64 | HR 115 | Wt 115.4 lb

## 2013-09-24 DIAGNOSIS — I5023 Acute on chronic systolic (congestive) heart failure: Secondary | ICD-10-CM

## 2013-09-24 DIAGNOSIS — I509 Heart failure, unspecified: Secondary | ICD-10-CM

## 2013-09-24 DIAGNOSIS — J90 Pleural effusion, not elsewhere classified: Secondary | ICD-10-CM

## 2013-09-24 DIAGNOSIS — I5022 Chronic systolic (congestive) heart failure: Secondary | ICD-10-CM

## 2013-09-24 LAB — BASIC METABOLIC PANEL
BUN: 5 mg/dL — ABNORMAL LOW (ref 6–23)
CHLORIDE: 93 meq/L — AB (ref 96–112)
CO2: 30 mEq/L (ref 19–32)
CREATININE: 0.87 mg/dL (ref 0.50–1.35)
Calcium: 9.4 mg/dL (ref 8.4–10.5)
Glucose, Bld: 89 mg/dL (ref 70–99)
Potassium: 3.3 mEq/L — ABNORMAL LOW (ref 3.7–5.3)
Sodium: 138 mEq/L (ref 137–147)

## 2013-09-24 LAB — PRO B NATRIURETIC PEPTIDE: PRO B NATRI PEPTIDE: 3147 pg/mL — AB (ref 0–125)

## 2013-09-24 NOTE — Progress Notes (Signed)
Patient ID: [name], male   DOB: 07-Sep-1970, 43 y.o.   MRN: [id number]   Weight Range   Baseline proBNP    Primary Cardiologist: Dr Domenic Polite  HPI: [name] is 43 year old with history of NICM (diagnosed 5/2012_ - norm cors 0998, chronic systolic heart failure EF 15%  08/2013, S/P ICD [employer], smoker, Wilms tumor s/p nephrectomy at age 24 had chemo radiation, scoliosis.    Admitted to [facility] with dyspnea. Had large R pleural effusion.Thoracentesis 1.2 liters. Discharged on lisinopril 2.5 mg daily, lasix 20 mg twice a day, carvedilol 18.75mg  twice a day. Discharge weight 126 pounds.   We saw [name] at the end of March and hard large recurrent R pleural effusion. Referred to Dr. Prescott Gum who placed Pleurex catheter on 4/1. Two days after placement he got about 500cc out. Hasn't drained any since. Breathing better. No further PND or cough. Anxious to get tube out. Not weighing every day at home. Weight here is down 10 pounds and he doesn't feel that is right. Appetite is very good. Able to do more with his son but gets tired a lot.    ECHO 08/2013 EF 15% RV mildly dilated.   Labs 3.25.15 K 3.9 Creatinine 0.7 5  SH: Lives with his son 11 year old . Unemployed. Disability pending. Drinks one-two beers occasionally. Former smoker quit January 2015. Dips tobacco FH: Grandfather MI  ROS: All systems negative except as listed in HPI, PMH and Problem List.  Past Medical History  Diagnosis Date  . Cardiomyopathy, nonischemic     takes Digoxin daily and Carvedilol daily  . Tobacco abuse   . Orthostatic hypotension   . Alcohol consumption heavy   . Scoliosis   . Wilm's tumor     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 43 (XRT and chemo)  . Hyperlipidemia     was on Simvastatin but has been off a year  . Chronic systolic heart failure   . Arthritis   . Chronic back pain   . Shortness of breath     lying flat and with exertion  . Low back pain     From Radiation damage was told by [facility] that he has  multiple fx in his back  . GERD (gastroesophageal reflux disease)     takes Omeprazole daily  . CHF (congestive heart failure)     takes Lasix daily as well Aldactone  . Insomnia     takes Ambien as needed(just got script yesterday)  . Depression     takes Prozac daily    Current Outpatient Prescriptions  Medication Sig Dispense Refill  . aspirin 81 MG tablet Take 81 mg by mouth daily.        . calcium-vitamin D (OSCAL WITH D) 500-200 MG-UNIT per tablet Take 2 tablets by mouth every morning.       . carvedilol (COREG) 12.5 MG tablet Take 1.5 tablets (18.75 mg total) by mouth every morning.  45 tablet  6  . digoxin (LANOXIN) 0.25 MG tablet Take 12.5 mg by mouth daily.      Marland Kitchen FLUoxetine (PROZAC) 20 MG capsule Take 1 capsule (20 mg total) by mouth every morning.  30 capsule  6  . furosemide (LASIX) 20 MG tablet Take 1 tablet (20 mg total) by mouth 2 (two) times daily.  60 tablet  0  . HYDROcodone-acetaminophen (NORCO) 7.5-325 MG per tablet Take 1 tablet by mouth 2 (two) times daily as needed (pain).      Marland Kitchen  lisinopril (PRINIVIL,ZESTRIL) 2.5 MG tablet Take 1 tablet (2.5 mg total) by mouth daily.  30 tablet  0  . omeprazole (PRILOSEC) 40 MG capsule Take 1 capsule (40 mg total) by mouth daily. FOR STOMACH  30 capsule  3  . spironolactone (ALDACTONE) 25 MG tablet Take 12.5 mg by mouth daily.      Marland Kitchen zolpidem (AMBIEN) 5 MG tablet Take 1 tablet (5 mg total) by mouth at bedtime as needed for sleep.  15 tablet  1   No current facility-administered medications for this encounter.     PHYSICAL EXAM: Filed Vitals:   09/24/13 1017  BP: 102/64  Pulse: 115  Weight: 115 lb 6.4 oz (52.345 kg)  SpO2: 98%    General:  Thin. . No resp difficulty HEENT: normal Neck: supple. JVP 6-7 . Carotids 2+ bilaterally; no bruits. No lymphadenopathy or thryomegaly appreciated. Cor: PMI normal. Tachycardic Regular rate & rhythm. No rubs, gallops or murmurs. L upper chest ICD Lungs: Decreased breath sound on R  1/2 up  Abdomen: soft, nontender, nondistended. No hepatosplenomegaly. No bruits or masses. Good bowel sounds. Extremities: no cyanosis, clubbing, rash, edema Neuro: alert & orientedx3, cranial nerves grossly intact. Moves all 4 extremities w/o difficulty. Affect pleasant.  ASSESSMENT & PLAN: 1. Chronic Systolic HF NICM EF 44% [employer] ICD.  Feeling much better. NYHA II-III. But remaans quite tachycardic. And I remained very concerned about [name]. Volume status stable. Continue lasix 20 mg twice a day and 12.5 mg spiro daily. Continue carvedilol 18.75 mg twice a day. On lisinopril 2.5 mg daily. - watch K closely as he has had hyperkalemia in past.  Continue digoxin 0.125 mg daily.  Will need CPX once effusion is cleared.  BMET today. If OK will increase lisinopril to 2.5 bid  2. R pleural effusion - s/p Pleurex. No longer draining. Will get CXR today. If no effusion will d/w Dr. Prescott Gum about getting tube out.  3. Dips tobacco  Follow up in 2-3 weeks   Shaune Pascal [name],MD 11:10 AM  Addendum: CXR shows persistent moderate R sided effusion. Have d/w Dr. Prescott Gum who will see [name] next week. Will need CPX +/- RHC once effusion addressed. I provided [name] with a letter for disability coverage.   Shaune Pascal [name],MD 12:59 PM

## 2013-09-24 NOTE — Patient Instructions (Signed)
Labs today  Your physician recommends that you schedule a follow-up appointment in: 3-4 weeks

## 2013-09-25 ENCOUNTER — Telehealth (HOSPITAL_COMMUNITY): Payer: Self-pay | Admitting: *Deleted

## 2013-09-25 MED ORDER — SPIRONOLACTONE 25 MG PO TABS: 25.0000 mg | ORAL_TABLET | Freq: Every day | ORAL | Status: DC

## 2013-09-25 NOTE — Telephone Encounter (Signed)
Message copied by Scarlette Calico on Fri Sep 25, 2013  4:34 PM ------      Message from: Jolaine Artist      Created: Fri Sep 25, 2013 11:19 AM       Increase spiro to 25. Repeat in 1 week. ------

## 2013-09-28 ENCOUNTER — Encounter: Payer: Self-pay | Admitting: Cardiothoracic Surgery

## 2013-09-28 ENCOUNTER — Telehealth: Payer: Self-pay | Admitting: Family Medicine

## 2013-09-28 ENCOUNTER — Ambulatory Visit (INDEPENDENT_AMBULATORY_CARE_PROVIDER_SITE_OTHER): Payer: Medicaid Other | Admitting: Cardiothoracic Surgery

## 2013-09-28 ENCOUNTER — Ambulatory Visit
Admission: RE | Admit: 2013-09-28 | Discharge: 2013-09-28 | Disposition: A | Discharge status: Home / Self Care | Payer: Medicare Other | Source: Ambulatory Visit | Attending: Cardiothoracic Surgery | Admitting: Cardiothoracic Surgery

## 2013-09-28 ENCOUNTER — Other Ambulatory Visit: Payer: Self-pay

## 2013-09-28 VITALS — BP 113/76 | HR 113 | Resp 16 | Ht 69.0 in | Wt 115.0 lb

## 2013-09-28 DIAGNOSIS — J90 Pleural effusion, not elsewhere classified: Secondary | ICD-10-CM

## 2013-09-28 DIAGNOSIS — I5022 Chronic systolic (congestive) heart failure: Secondary | ICD-10-CM

## 2013-09-28 DIAGNOSIS — Z483 Aftercare following surgery for neoplasm: Secondary | ICD-10-CM

## 2013-09-28 MED ORDER — HYDROCODONE-ACETAMINOPHEN 7.5-325 MG PO TABS: 1.0000 | ORAL_TABLET | Freq: Two times a day (BID) | ORAL | Status: DC | PRN

## 2013-09-28 NOTE — Telephone Encounter (Signed)
Call back number is [phone] Pt is needing a refill on HYDROcodone-acetaminophen (NORCO) 7.5-325 MG per tablet

## 2013-09-28 NOTE — Progress Notes (Signed)
PCP is [name], MD Referring Provider is [name], Shaune Pascal, MD  Chief Complaint  Patient presents with  . Pleural Effusion    has right pleurX...has not had any drainage since 09/11/13    HPI: Patient returns for Pleurx catheter check 12 days after insertion. He is right-sided effusion from CHF now treated through the advanced heart failure clinic. Initially he had 500 cc of drainage at home but none since then. He has no shortness of breath. Chest x-ray today shows no significant reaccumulation of fluid. He does have some atelectasis of the right middle lobe. Attempted drainage in the office today drained 0 fluid. The catheter was removed in the office today under topical anesthesia.    Past Medical History  Diagnosis Date  . Cardiomyopathy, nonischemic     takes Digoxin daily and Carvedilol daily  . Tobacco abuse   . Orthostatic hypotension   . Alcohol consumption heavy   . Scoliosis   . Wilm's tumor     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 and chemo)  . Hyperlipidemia     was on Simvastatin but has been off a year  . Chronic systolic heart failure   . Arthritis   . Chronic back pain   . Shortness of breath     lying flat and with exertion  . Low back pain     From Radiation damage was told by [facility] that he has multiple fx in his back  . GERD (gastroesophageal reflux disease)     takes Omeprazole daily  . CHF (congestive heart failure)     takes Lasix daily as well Aldactone  . Insomnia     takes Ambien as needed(just got script yesterday)  . Depression     takes Prozac daily    Past Surgical History  Procedure Laterality Date  . Nephrectomy  36 yrs ago    Wilms Tumor  . Icd  05/25/2011    [employer] End[employer] SG lead/[employer] single chamber device  . Carpal tunnel release Right 01/09/2013    Procedure: CARPAL TUNNEL RELEASE;  Surgeon: Winfield Cunas, MD;  Location: [place] [facility];  Service: Neurosurgery;  Laterality: Right;  RIGHT carpal tunnel release  .  Ulnar nerve transposition Right 01/09/2013    Procedure: ULNAR NERVE DECOMPRESSION/TRANSPOSITION;  Surgeon: Winfield Cunas, MD;  Location: [facility];  Service: Neurosurgery;  Laterality: Right;  RIGHT ulnar nerve decompression  . Carpal tunnel release Left 01/30/2013    Procedure: LEFT CARPAL TUNNEL RELEASE;  Surgeon: Winfield Cunas, MD;  Location: [place] [facility];  Service: Neurosurgery;  Laterality: Left;  LEFT Carpal Tunnel release  . Chest tube insertion Right 09/09/2013    Procedure: INSERTION PLEURAL DRAINAGE CATHETER;  Surgeon: Ivin Poot, MD;  Location: [place];  Service: Thoracic;  Laterality: Right;    No family history on file.  Social History History  Substance Use Topics  . Smoking status: Former Smoker -- 0.25 packs/day for 20 years    Types: Cigarettes  . Smokeless tobacco: Current User    Types: Snuff, Chew     Comment: quit smoking the 1st of Jan 2015  . Alcohol Use: No    Current Outpatient Prescriptions  Medication Sig Dispense Refill  . aspirin 81 MG tablet Take 81 mg by mouth daily.        . calcium-vitamin D (OSCAL WITH D) 500-200 MG-UNIT per tablet Take 2 tablets by mouth every morning.       . carvedilol (COREG)  12.5 MG tablet Take 1.5 tablets (18.75 mg total) by mouth every morning.  45 tablet  6  . digoxin (LANOXIN) 0.25 MG tablet Take 12.5 mg by mouth daily.      Marland Kitchen FLUoxetine (PROZAC) 20 MG capsule Take 1 capsule (20 mg total) by mouth every morning.  30 capsule  6  . furosemide (LASIX) 20 MG tablet Take 1 tablet (20 mg total) by mouth 2 (two) times daily.  60 tablet  0  . HYDROcodone-acetaminophen (NORCO) 7.5-325 MG per tablet Take 1 tablet by mouth 2 (two) times daily as needed (pain).  30 tablet  0  . lisinopril (PRINIVIL,ZESTRIL) 2.5 MG tablet Take 1 tablet (2.5 mg total) by mouth daily.  30 tablet  0  . spironolactone (ALDACTONE) 25 MG tablet Take 1 tablet (25 mg total) by mouth daily.  30 tablet  6  . zolpidem (AMBIEN) 5 MG tablet Take 1 tablet (5 mg  total) by mouth at bedtime as needed for sleep.  15 tablet  1   No current facility-administered medications for this visit.    Allergies  Allergen Reactions  . Ibuprofen Hives    Review of SystemsPatient has lost 6 pounds with the Pleurx catheter drainage. He denies fever or cough. He denies shortness of breath.  BP 113/76  Pulse 113  Resp 16  Ht 5\' 9"  (1.753 m)  Wt 115 lb (52.164 kg)  BMI 16.97 kg/m2 Physical Exam Alert comfortable Breath sounds slightly diminished on right Heart rate regular without gallop No pedal edema Rex catheter site slightly red. Pleurx catheter site anesthetized with topical anesthesia and extracted in its entirety. 2 sutures placed.  Diagnostic Tests: Chest x-ray today reviewed showing no significant reaccumulation of fluid.  Impression: Resolved right pleural effusion with improved therapy of his advanced heart failure  Plan:Return for suture removal in one week

## 2013-09-28 NOTE — Telephone Encounter (Signed)
Kay to refill

## 2013-09-28 NOTE — Telephone Encounter (Signed)
Ok to refill??  Last office visit 09/07/2013.  Last refill 08/24/2013.

## 2013-09-28 NOTE — Telephone Encounter (Signed)
Prescription printed and patient made aware to come to office to pick up.  

## 2013-09-29 ENCOUNTER — Telehealth: Payer: Self-pay | Admitting: *Deleted

## 2013-09-29 MED ORDER — HYDROCODONE-ACETAMINOPHEN 7.5-325 MG PO TABS: 1.0000 | ORAL_TABLET | Freq: Two times a day (BID) | ORAL | Status: DC | PRN

## 2013-09-29 NOTE — Telephone Encounter (Signed)
Message copied by Sheral Flow on Tue Sep 29, 2013 10:15 AM ------      Message from: Lenore Manner      Created: Tue Sep 29, 2013  9:13 AM      Regarding: Med question      Contact: [phone]       PT has a question about his prescription he states he normally gets it for 60 and got it for 30 this month.  ------

## 2013-09-29 NOTE — Telephone Encounter (Signed)
Hydrocodone script printed on 09/28/2013 with qty #30.   MD please advise.

## 2013-09-29 NOTE — Telephone Encounter (Signed)
He gets #60 tablets, please correct if he has not had the script filled  If he did get it filled, in can get another script in 2 weeks

## 2013-09-29 NOTE — Telephone Encounter (Signed)
Call placed to patient.   States that he has not filled incorrect script.   Advised to bring back script for #30 so that it can be destroyed and new script will be printed with corrections.

## 2013-10-06 ENCOUNTER — Ambulatory Visit (INDEPENDENT_AMBULATORY_CARE_PROVIDER_SITE_OTHER): Payer: Medicaid Other

## 2013-10-06 DIAGNOSIS — J90 Pleural effusion, not elsewhere classified: Secondary | ICD-10-CM

## 2013-10-06 DIAGNOSIS — Z4802 Encounter for removal of sutures: Secondary | ICD-10-CM

## 2013-10-07 NOTE — Progress Notes (Signed)
Removed 2 sutures from chest tube sites, with no signs of infection and pt tolerated wel.

## 2013-10-16 ENCOUNTER — Encounter (HOSPITAL_COMMUNITY): Payer: Self-pay

## 2013-10-16 ENCOUNTER — Ambulatory Visit (HOSPITAL_COMMUNITY)
Admission: RE | Admit: 2013-10-16 | Discharge: 2013-10-16 | Disposition: A | Discharge status: Home / Self Care | Payer: Medicaid Other | Source: Ambulatory Visit | Attending: Internal Medicine | Admitting: Internal Medicine

## 2013-10-16 VITALS — BP 96/70 | HR 91 | Wt 116.8 lb

## 2013-10-16 DIAGNOSIS — J90 Pleural effusion, not elsewhere classified: Secondary | ICD-10-CM | POA: Insufficient documentation

## 2013-10-16 DIAGNOSIS — Z905 Acquired absence of kidney: Secondary | ICD-10-CM | POA: Insufficient documentation

## 2013-10-16 DIAGNOSIS — I5022 Chronic systolic (congestive) heart failure: Secondary | ICD-10-CM | POA: Diagnosis not present

## 2013-10-16 DIAGNOSIS — F3289 Other specified depressive episodes: Secondary | ICD-10-CM | POA: Insufficient documentation

## 2013-10-16 DIAGNOSIS — I509 Heart failure, unspecified: Secondary | ICD-10-CM

## 2013-10-16 DIAGNOSIS — F17200 Nicotine dependence, unspecified, uncomplicated: Secondary | ICD-10-CM | POA: Insufficient documentation

## 2013-10-16 DIAGNOSIS — M545 Low back pain, unspecified: Secondary | ICD-10-CM | POA: Insufficient documentation

## 2013-10-16 DIAGNOSIS — Z923 Personal history of irradiation: Secondary | ICD-10-CM | POA: Insufficient documentation

## 2013-10-16 DIAGNOSIS — I428 Other cardiomyopathies: Secondary | ICD-10-CM | POA: Insufficient documentation

## 2013-10-16 DIAGNOSIS — Z79899 Other long term (current) drug therapy: Secondary | ICD-10-CM | POA: Insufficient documentation

## 2013-10-16 DIAGNOSIS — Z85528 Personal history of other malignant neoplasm of kidney: Secondary | ICD-10-CM | POA: Insufficient documentation

## 2013-10-16 DIAGNOSIS — G8929 Other chronic pain: Secondary | ICD-10-CM | POA: Insufficient documentation

## 2013-10-16 DIAGNOSIS — E875 Hyperkalemia: Secondary | ICD-10-CM | POA: Insufficient documentation

## 2013-10-16 DIAGNOSIS — E785 Hyperlipidemia, unspecified: Secondary | ICD-10-CM | POA: Insufficient documentation

## 2013-10-16 DIAGNOSIS — Z7982 Long term (current) use of aspirin: Secondary | ICD-10-CM | POA: Insufficient documentation

## 2013-10-16 DIAGNOSIS — Z9221 Personal history of antineoplastic chemotherapy: Secondary | ICD-10-CM | POA: Insufficient documentation

## 2013-10-16 DIAGNOSIS — G4700 Insomnia, unspecified: Secondary | ICD-10-CM | POA: Insufficient documentation

## 2013-10-16 DIAGNOSIS — Z95810 Presence of automatic (implantable) cardiac defibrillator: Secondary | ICD-10-CM | POA: Insufficient documentation

## 2013-10-16 DIAGNOSIS — F329 Major depressive disorder, single episode, unspecified: Secondary | ICD-10-CM | POA: Insufficient documentation

## 2013-10-16 DIAGNOSIS — K219 Gastro-esophageal reflux disease without esophagitis: Secondary | ICD-10-CM | POA: Insufficient documentation

## 2013-10-16 MED ORDER — LISINOPRIL 2.5 MG PO TABS: 2.5000 mg | ORAL_TABLET | Freq: Every day | ORAL | Status: DC

## 2013-10-16 NOTE — Addendum Note (Signed)
Encounter addended by: Scarlette Calico, RN on: 10/16/2013 10:42 AM<BR>     Documentation filed: Patient Instructions Section, Orders

## 2013-10-16 NOTE — Patient Instructions (Signed)
Restart Lisinopril 2.5 mg daily  Labs in 1 week  Your physician has recommended that you have a cardiopulmonary stress test (CPX). CPX testing is a non-invasive measurement of heart and lung function. It replaces a traditional treadmill stress test. This type of test provides a tremendous amount of information that relates not only to your present condition but also for future outcomes. This test combines measurements of you ventilation, respiratory gas exchange in the lungs, electrocardiogram (EKG), blood pressure and physical response before, during, and following an exercise protocol.  Your physician recommends that you schedule a follow-up appointment in: 1 month

## 2013-10-16 NOTE — Progress Notes (Signed)
Patient ID: [name], male   DOB: 01/09/1971, 43 y.o.   MRN: [id number]   Weight Range   Baseline proBNP    Primary Cardiologist: Dr Domenic Polite  HPI: Mr [name] is 43 year old with history of NICM (diagnosed 5/2012_ - norm cors 6237, chronic systolic heart failure EF 15%  08/2013, S/P ICD [employer], smoker, Wilms tumor s/p nephrectomy at age 6 had chemo radiation, scoliosis.    Admitted to [facility] with dyspnea. Had large R pleural effusion.Thoracentesis 1.2 liters. Discharged on lisinopril 2.5 mg daily, lasix 20 mg twice a day, carvedilol 18.75mg  twice a day. Discharge weight 126 pounds.   We saw him at the end of March and hard large recurrent R pleural effusion. Referred to Dr. Prescott Gum who placed Pleurex catheter on 4/1. Which was removed on 09/28/13. F/u CXR showed only small R effusion.   Here for routine f/u. Feels ok as long as he goes at his own pace. Works in yard. Goes to son's baseball game. But avoids steps or hills. Not weighing every day at home. Weight consistently 115-118. Has been out of lisinopril for several weeks. Unable to get from pharmacy. BP low but asx.   ECHO 08/2013 EF 15% RV mildly dilated.   Labs 3.25.15 K 3.9 Creatinine 0.7 5  SH: Lives with his son 20 year old . Unemployed. Disability pending. Drinks one-two beers occasionally. Former smoker quit January 2015. Dips tobacco FH: Grandfather MI  ROS: All systems negative except as listed in HPI, PMH and Problem List.  Past Medical History  Diagnosis Date  . Cardiomyopathy, nonischemic     takes Digoxin daily and Carvedilol daily  . Tobacco abuse   . Orthostatic hypotension   . Alcohol consumption heavy   . Scoliosis   . Wilm's tumor     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 82 (XRT and chemo)  . Hyperlipidemia     was on Simvastatin but has been off a year  . Chronic systolic heart failure   . Arthritis   . Chronic back pain   . Shortness of breath     lying flat and with exertion  . Low back pain     From  Radiation damage was told by [facility] that he has multiple fx in his back  . GERD (gastroesophageal reflux disease)     takes Omeprazole daily  . CHF (congestive heart failure)     takes Lasix daily as well Aldactone  . Insomnia     takes Ambien as needed(just got script yesterday)  . Depression     takes Prozac daily    Current Outpatient Prescriptions  Medication Sig Dispense Refill  . aspirin 81 MG tablet Take 81 mg by mouth daily.        . calcium-vitamin D (OSCAL WITH D) 500-200 MG-UNIT per tablet Take 2 tablets by mouth every morning.       . carvedilol (COREG) 12.5 MG tablet Take 1.5 tablets (18.75 mg total) by mouth every morning.  45 tablet  6  . digoxin (LANOXIN) 0.25 MG tablet Take 12.5 mg by mouth daily.      Marland Kitchen FLUoxetine (PROZAC) 20 MG capsule Take 1 capsule (20 mg total) by mouth every morning.  30 capsule  6  . furosemide (LASIX) 20 MG tablet Take 1 tablet (20 mg total) by mouth 2 (two) times daily.  60 tablet  0  . HYDROcodone-acetaminophen (NORCO) 7.5-325 MG per tablet Take 1 tablet by mouth 2 (two) times daily as needed (  pain).  60 tablet  0  . lisinopril (PRINIVIL,ZESTRIL) 2.5 MG tablet Take 1 tablet (2.5 mg total) by mouth daily.  30 tablet  0  . spironolactone (ALDACTONE) 25 MG tablet Take 1 tablet (25 mg total) by mouth daily.  30 tablet  6  . zolpidem (AMBIEN) 5 MG tablet Take 1 tablet (5 mg total) by mouth at bedtime as needed for sleep.  15 tablet  1   No current facility-administered medications for this encounter.     PHYSICAL EXAM: Filed Vitals:   10/16/13 0942  BP: 96/70  Pulse: 91  Weight: 116 lb 12.8 oz (52.98 kg)  SpO2: 99%    General:  Thin. . No resp difficulty HEENT: normal Neck: supple. JVP 6-7 . Carotids 2+ bilaterally; no bruits. No lymphadenopathy or thryomegaly appreciated. Cor: PMI normal. Regular rate & rhythm. No rubs or murmurs. Soft s3 L upper chest ICD Lungs: Decreased breath sounds R base Abdomen: soft, nontender,  nondistended. No hepatosplenomegaly. No bruits or masses. Good bowel sounds. Extremities: no cyanosis, clubbing, rash, edema Neuro: alert & orientedx3, cranial nerves grossly intact. Moves all 4 extremities w/o difficulty. Affect pleasant.  ASSESSMENT & PLAN: 1. Chronic Systolic HF NICM EF 93% [employer] ICD.  Feeling much better. Still NYHA II-III. Tachycardia improved. Volume status stable. Will try to restart lisinopril 2.5 mg daily. - watch BP and K closely as he has had hyperkalemia in past.  Continue digoxin 0.125 mg daily.  Will get CPX  Check BMET in 1 week.  2. R pleural effusion - s/p Pleurex. F/u CXR without recurrent effusion 3. Dips tobacco - counseled on need to stop  Follow up in 2-3 weeks   [name],MD 10:26 AM

## 2013-10-29 ENCOUNTER — Telehealth: Payer: Self-pay | Admitting: *Deleted

## 2013-10-29 ENCOUNTER — Telehealth: Payer: Self-pay | Admitting: Family Medicine

## 2013-10-29 NOTE — Telephone Encounter (Signed)
Ok to refill??  Last office visit 09/07/2013.  Last refill 09/29/2013.

## 2013-10-30 MED ORDER — HYDROCODONE-ACETAMINOPHEN 7.5-325 MG PO TABS: 1.0000 | ORAL_TABLET | Freq: Two times a day (BID) | ORAL | Status: DC | PRN

## 2013-10-30 NOTE — Telephone Encounter (Signed)
Prescription printed and patient made aware to come to office to pick up.  

## 2013-10-30 NOTE — Telephone Encounter (Signed)
Okay to refill? 

## 2013-11-04 ENCOUNTER — Telehealth (HOSPITAL_COMMUNITY): Payer: Self-pay | Admitting: *Deleted

## 2013-11-04 ENCOUNTER — Ambulatory Visit (HOSPITAL_COMMUNITY)
Admission: RE | Admit: 2013-11-04 | Discharge: 2013-11-04 | Disposition: A | Discharge status: Home / Self Care | Payer: Medicaid Other | Source: Ambulatory Visit | Attending: Internal Medicine | Admitting: Internal Medicine

## 2013-11-04 ENCOUNTER — Ambulatory Visit (HOSPITAL_COMMUNITY): Payer: Medicaid Other

## 2013-11-04 DIAGNOSIS — J449 Chronic obstructive pulmonary disease, unspecified: Secondary | ICD-10-CM

## 2013-11-04 DIAGNOSIS — I5023 Acute on chronic systolic (congestive) heart failure: Secondary | ICD-10-CM | POA: Insufficient documentation

## 2013-11-04 DIAGNOSIS — I420 Dilated cardiomyopathy: Secondary | ICD-10-CM

## 2013-11-04 DIAGNOSIS — I5022 Chronic systolic (congestive) heart failure: Secondary | ICD-10-CM

## 2013-11-04 LAB — BASIC METABOLIC PANEL
BUN: 5 mg/dL — ABNORMAL LOW (ref 6–23)
CHLORIDE: 96 meq/L (ref 96–112)
CO2: 26 mEq/L (ref 19–32)
CREATININE: 0.81 mg/dL (ref 0.50–1.35)
Calcium: 9.7 mg/dL (ref 8.4–10.5)
Glucose, Bld: 145 mg/dL — ABNORMAL HIGH (ref 70–99)
Potassium: 5.4 mEq/L — ABNORMAL HIGH (ref 3.7–5.3)
Sodium: 136 mEq/L — ABNORMAL LOW (ref 137–147)

## 2013-11-04 LAB — DIGOXIN LEVEL: DIGOXIN LVL: 0.6 ng/mL — AB (ref 0.8–2.0)

## 2013-11-04 NOTE — Telephone Encounter (Signed)
Clarification on digoxin, chart states 0.25 mg tabs take 12.5 mg daily, pt is unsure of dose but states he takes 1 tab daily, called [facility] in [place] pt had 0.125 mg 1 tab daily filled last month, per Junie Bame, NP will get dig level today

## 2013-11-10 ENCOUNTER — Encounter: Payer: Self-pay | Admitting: Internal Medicine

## 2013-11-12 ENCOUNTER — Encounter (HOSPITAL_COMMUNITY): Payer: Self-pay

## 2013-11-12 ENCOUNTER — Ambulatory Visit (HOSPITAL_COMMUNITY)
Admission: RE | Admit: 2013-11-12 | Discharge: 2013-11-12 | Disposition: A | Discharge status: Home / Self Care | Payer: Medicaid Other | Source: Ambulatory Visit | Attending: Cardiology | Admitting: Cardiology

## 2013-11-12 VITALS — BP 108/70 | Wt 124.4 lb

## 2013-11-12 DIAGNOSIS — F329 Major depressive disorder, single episode, unspecified: Secondary | ICD-10-CM | POA: Diagnosis not present

## 2013-11-12 DIAGNOSIS — K219 Gastro-esophageal reflux disease without esophagitis: Secondary | ICD-10-CM | POA: Insufficient documentation

## 2013-11-12 DIAGNOSIS — F17200 Nicotine dependence, unspecified, uncomplicated: Secondary | ICD-10-CM | POA: Diagnosis not present

## 2013-11-12 DIAGNOSIS — Z7982 Long term (current) use of aspirin: Secondary | ICD-10-CM | POA: Insufficient documentation

## 2013-11-12 DIAGNOSIS — I509 Heart failure, unspecified: Secondary | ICD-10-CM | POA: Insufficient documentation

## 2013-11-12 DIAGNOSIS — I5022 Chronic systolic (congestive) heart failure: Secondary | ICD-10-CM | POA: Insufficient documentation

## 2013-11-12 DIAGNOSIS — F3289 Other specified depressive episodes: Secondary | ICD-10-CM | POA: Insufficient documentation

## 2013-11-12 DIAGNOSIS — I428 Other cardiomyopathies: Secondary | ICD-10-CM | POA: Insufficient documentation

## 2013-11-12 DIAGNOSIS — G4700 Insomnia, unspecified: Secondary | ICD-10-CM | POA: Diagnosis not present

## 2013-11-12 DIAGNOSIS — I5023 Acute on chronic systolic (congestive) heart failure: Secondary | ICD-10-CM

## 2013-11-12 DIAGNOSIS — E785 Hyperlipidemia, unspecified: Secondary | ICD-10-CM | POA: Insufficient documentation

## 2013-11-12 DIAGNOSIS — J90 Pleural effusion, not elsewhere classified: Secondary | ICD-10-CM | POA: Insufficient documentation

## 2013-11-12 LAB — BASIC METABOLIC PANEL
BUN: 8 mg/dL (ref 6–23)
CHLORIDE: 101 meq/L (ref 96–112)
CO2: 28 mEq/L (ref 19–32)
Calcium: 9.6 mg/dL (ref 8.4–10.5)
Creatinine, Ser: 0.81 mg/dL (ref 0.50–1.35)
GFR calc Af Amer: >90 mL/min (ref >90)
Glucose, Bld: 113 mg/dL — ABNORMAL HIGH (ref 70–99)
Potassium: 4.4 mEq/L (ref 3.7–5.3)
Sodium: 138 mEq/L (ref 137–147)

## 2013-11-12 LAB — PRO B NATRIURETIC PEPTIDE: PRO B NATRI PEPTIDE: 2840 pg/mL — AB (ref 0–125)

## 2013-11-12 MED ORDER — CARVEDILOL 12.5 MG PO TABS: 12.5000 mg | ORAL_TABLET | Freq: Two times a day (BID) | ORAL | Status: DC

## 2013-11-12 NOTE — Patient Instructions (Signed)
Change Carvedilol to 12.5 mg Twice daily   Labs today  Your physician recommends that you schedule a follow-up appointment in: 6 weeks

## 2013-11-12 NOTE — Progress Notes (Signed)
Patient ID: [name], male   DOB: [birth year], 43 y.o.   MRN: [id number]  Weight Range   Baseline proBNP    Primary Cardiologist: Dr Domenic Polite  HPI: [name] is 43 year old with history of NICM (diagnosed 10/2010 - norm cors 7253), chronic systolic heart failure EF 15% (08/2013), S/P ICD [employer], smoker, Wilms tumor s/p nephrectomy at age 47 had chemoradiation, scoliosis.    Admitted to [facility] with dyspnea. Had large R pleural effusion.mThoracentesis 1.2 liters. Discharged on lisinopril 2.5 mg daily, lasix 20 mg twice a day, carvedilol 18.75mg  twice a day. Discharge weight 126 pounds.   We saw him at the end of March 2015 and had large recurrent R pleural effusion. Referred to Dr. Prescott Gum who placed Pleurex catheter on 4/10, removed on 09/28/13. F/u CXR showed only small R effusion.   CPX (5/15) with RER 1.06, VO2 max 18.5, VE/VCO2 slope 32.1.  Moderate to severe functional limitation, circulatory.   Here for routine followup. Works in yard. Goes to son's baseball games. Avoids steps or hills. Weight higher but eating more protein/trying to gain weight.  Hunts/fishes.  He is taking lasix only once daily and Coreg only once daily.  No lightheadedness, palpitations, or syncope.   ECHO 08/2013 EF 15% RV mildly dilated.   Labs (3/15): K 3.9 Creatinine 0.7 Labs (5/15): K 5.4, creatinine 0.81, digoxin 0.6  SH: Lives with his son 40 year old . Unemployed. Disability pending. Drinks one-two beers occasionally. Former smoker quit January 2015. Dips tobacco  FH: Grandfather MI  ROS: All systems negative except as listed in HPI, PMH and Problem List.  Past Medical History  Diagnosis Date  . Cardiomyopathy, nonischemic     takes Digoxin daily and Carvedilol daily  . Tobacco abuse   . Orthostatic hypotension   . Alcohol consumption heavy   . Scoliosis   . Wilm's tumor     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 15 (XRT and chemo)  . Hyperlipidemia     was on Simvastatin but has been off a year  . Chronic  systolic heart failure   . Arthritis   . Chronic back pain   . Shortness of breath     lying flat and with exertion  . Low back pain     From Radiation damage was told by [facility] that he has multiple fx in his back  . GERD (gastroesophageal reflux disease)     takes Omeprazole daily  . CHF (congestive heart failure)     takes Lasix daily as well Aldactone  . Insomnia     takes Ambien as needed(just got script yesterday)  . Depression     takes Prozac daily    Current Outpatient Prescriptions  Medication Sig Dispense Refill  . aspirin 81 MG tablet Take 81 mg by mouth daily.        . calcium-vitamin D (OSCAL WITH D) 500-200 MG-UNIT per tablet Take 2 tablets by mouth every morning.       . carvedilol (COREG) 12.5 MG tablet Take 1 tablet (12.5 mg total) by mouth 2 (two) times daily with a meal.  60 tablet  6  . digoxin (LANOXIN) 0.125 MG tablet Take 0.125 mg by mouth daily.      Marland Kitchen FLUoxetine (PROZAC) 20 MG capsule Take 1 capsule (20 mg total) by mouth every morning.  30 capsule  6  . furosemide (LASIX) 20 MG tablet Take 1 tablet (20 mg total) by mouth 2 (two) times daily.  60 tablet  0  . HYDROcodone-acetaminophen (NORCO) 7.5-325 MG per tablet Take 1 tablet by mouth 2 (two) times daily as needed (pain).  60 tablet  0  . lisinopril (PRINIVIL,ZESTRIL) 2.5 MG tablet Take 1 tablet (2.5 mg total) by mouth daily.  30 tablet  6  . spironolactone (ALDACTONE) 25 MG tablet Take 1 tablet (25 mg total) by mouth daily.  30 tablet  6  . zolpidem (AMBIEN) 5 MG tablet Take 1 tablet (5 mg total) by mouth at bedtime as needed for sleep.  15 tablet  1   No current facility-administered medications for this encounter.     PHYSICAL EXAM: Filed Vitals:   11/12/13 1128  BP: 108/70  Weight: 124 lb 6.4 oz (56.427 kg)    General:  Thin. . No resp difficulty HEENT: normal Neck: supple. JVP 6-7 . Carotids 2+ bilaterally; no bruits. No lymphadenopathy or thryomegaly appreciated. Cor: PMI normal.  Regular rate & rhythm. No rubs or murmurs. No S3. L upper chest ICD Lungs: Decreased breath sounds R base Abdomen: soft, nontender, nondistended. No hepatosplenomegaly. No bruits or masses. Good bowel sounds. Extremities: no cyanosis, clubbing, rash, edema Neuro: alert & orientedx3, cranial nerves grossly intact. Moves all 4 extremities w/o difficulty. Affect pleasant.  ASSESSMENT & PLAN: 1. Chronic Systolic HF:  Nonischemic cardiomyopathy, EF 15%.  Hasty.   NYHA class II-III.  Stable symptomatically. He is not volume overloaded on exam. Moderate functional limitation on CPX primarily circulatory in origin.   - K high recently, will not adjust lisinopril.  Continue lisinopril and spironolactone at current doses.  - He is only taking Coreg once a day.  I will have him increase this to 12.5 mg bid rather than 18.75 daily.  - Continue digoxin 0.125 mg daily, level ok in 5/15.  - Continue Lasix 20 mg daily.  - BMET/BNP today.  2. R pleural effusion: s/p Pleurex. Stable decreased breath sounds right base on exam.   Larey [name] 11/12/2013

## 2013-11-30 ENCOUNTER — Telehealth: Payer: Self-pay | Admitting: Family Medicine

## 2013-11-30 MED ORDER — HYDROCODONE-ACETAMINOPHEN 7.5-325 MG PO TABS: 1.0000 | ORAL_TABLET | Freq: Two times a day (BID) | ORAL | Status: DC | PRN

## 2013-11-30 NOTE — Telephone Encounter (Signed)
Okay to refill? 

## 2013-11-30 NOTE — Telephone Encounter (Signed)
Ok to refill??  Last office visit 09/07/2013.  Last refill 10/30/2013.

## 2013-11-30 NOTE — Telephone Encounter (Signed)
Patient is calling to refill on his norco  [phone]

## 2013-11-30 NOTE — Telephone Encounter (Signed)
Prescription printed and patient made aware to come to office to pick up per VM.  

## 2013-12-24 ENCOUNTER — Encounter (HOSPITAL_COMMUNITY): Payer: Medicaid Other

## 2013-12-31 ENCOUNTER — Telehealth: Payer: Self-pay | Admitting: Family Medicine

## 2013-12-31 NOTE — Telephone Encounter (Signed)
Ok to refill??  Last office visit 09/07/2013.  Last refill 11/30/2013.

## 2013-12-31 NOTE — Telephone Encounter (Signed)
[phone]  Pt is needing a refill on HYDROcodone-acetaminophen (NORCO) 7.5-325 MG per tablet

## 2014-01-01 MED ORDER — HYDROCODONE-ACETAMINOPHEN 7.5-325 MG PO TABS: 1.0000 | ORAL_TABLET | Freq: Two times a day (BID) | ORAL | Status: DC | PRN

## 2014-01-01 NOTE — Telephone Encounter (Signed)
Prescription printed and patient made aware to come to office to pick up per VM.  

## 2014-01-01 NOTE — Telephone Encounter (Signed)
okay

## 2014-01-19 ENCOUNTER — Encounter (HOSPITAL_COMMUNITY): Payer: Self-pay

## 2014-01-19 ENCOUNTER — Ambulatory Visit (HOSPITAL_COMMUNITY)
Admission: RE | Admit: 2014-01-19 | Discharge: 2014-01-19 | Disposition: A | Discharge status: Home / Self Care | Payer: Medicaid Other | Source: Ambulatory Visit | Attending: Internal Medicine | Admitting: Internal Medicine

## 2014-01-19 VITALS — BP 110/70 | HR 98 | Resp 18 | Wt 124.2 lb

## 2014-01-19 DIAGNOSIS — I428 Other cardiomyopathies: Secondary | ICD-10-CM | POA: Diagnosis present

## 2014-01-19 DIAGNOSIS — F1010 Alcohol abuse, uncomplicated: Secondary | ICD-10-CM | POA: Insufficient documentation

## 2014-01-19 DIAGNOSIS — I951 Orthostatic hypotension: Secondary | ICD-10-CM | POA: Diagnosis not present

## 2014-01-19 DIAGNOSIS — M4120 Other idiopathic scoliosis, site unspecified: Secondary | ICD-10-CM | POA: Diagnosis not present

## 2014-01-19 DIAGNOSIS — M545 Low back pain, unspecified: Secondary | ICD-10-CM | POA: Diagnosis not present

## 2014-01-19 DIAGNOSIS — F17200 Nicotine dependence, unspecified, uncomplicated: Secondary | ICD-10-CM | POA: Diagnosis not present

## 2014-01-19 DIAGNOSIS — I5022 Chronic systolic (congestive) heart failure: Secondary | ICD-10-CM | POA: Insufficient documentation

## 2014-01-19 DIAGNOSIS — Z95810 Presence of automatic (implantable) cardiac defibrillator: Secondary | ICD-10-CM | POA: Insufficient documentation

## 2014-01-19 DIAGNOSIS — K219 Gastro-esophageal reflux disease without esophagitis: Secondary | ICD-10-CM | POA: Diagnosis not present

## 2014-01-19 DIAGNOSIS — F329 Major depressive disorder, single episode, unspecified: Secondary | ICD-10-CM | POA: Diagnosis not present

## 2014-01-19 DIAGNOSIS — Z85528 Personal history of other malignant neoplasm of kidney: Secondary | ICD-10-CM | POA: Diagnosis not present

## 2014-01-19 DIAGNOSIS — J90 Pleural effusion, not elsewhere classified: Secondary | ICD-10-CM | POA: Insufficient documentation

## 2014-01-19 DIAGNOSIS — I509 Heart failure, unspecified: Secondary | ICD-10-CM

## 2014-01-19 DIAGNOSIS — G4700 Insomnia, unspecified: Secondary | ICD-10-CM | POA: Insufficient documentation

## 2014-01-19 DIAGNOSIS — E785 Hyperlipidemia, unspecified: Secondary | ICD-10-CM | POA: Insufficient documentation

## 2014-01-19 DIAGNOSIS — F3289 Other specified depressive episodes: Secondary | ICD-10-CM | POA: Insufficient documentation

## 2014-01-19 MED ORDER — CARVEDILOL 12.5 MG PO TABS: ORAL_TABLET | ORAL | Status: DC

## 2014-01-19 NOTE — Progress Notes (Signed)
**Note Richard-Identified via Obfuscation** Patient ID: [name], male   DOB: Oct 18, 1970, 43 y.o.   MRN: [id number]   Weight Range   Baseline proBNP    Primary Cardiologist: Dr Domenic Polite  HPI: [name] is 43 year old with history of NICM (diagnosed 10/2010 - norm cors 3546), chronic systolic heart failure EF 15% (08/2013), S/P ICD [employer], smoker, Wilms tumor s/p nephrectomy at age 46 had chemoradiation, scoliosis.    Admitted to [facility] with dyspnea. Had large R pleural effusion.mThoracentesis 1.2 liters. Discharged on lisinopril 2.5 mg daily, lasix 20 mg twice a day, carvedilol 18.75mg  twice a day. Discharge weight 126 pounds.   We saw him at the end of March 2015 and had large recurrent R pleural effusion. Referred to Dr. Prescott [name] who placed Pleurex catheter on 4/10, removed on 09/28/13. F/u CXR showed only small R effusion.   CPX (5/15) with RER 1.06, VO2 max 18.5, VE/VCO2 slope 32.1.  Moderate to severe functional limitation, circulatory.   He returns for follow up. Last visit carvedilol was increased to 12.5 mg twice a day. Denies SOB/PND/Orthopnea. Does admit dyspnea with inclined. Taking all medications.   ECHO 08/2013 EF 15% RV mildly dilated.   Labs (3/15): K 3.9 Creatinine 0.7 Labs (5/15): K 5.4, creatinine 0.81, digoxin 0.6  SH: Lives with his son 73 year old . Unemployed. Disability pending. Drinks one-two beers occasionally. Former smoker quit January 2015. Dips tobacco  FH: Grandfather MI  ROS: All systems negative except as listed in HPI, PMH and Problem List.  Past Medical History  Diagnosis Date  . Cardiomyopathy, nonischemic     takes Digoxin daily and Carvedilol daily  . Tobacco abuse   . Orthostatic hypotension   . Alcohol consumption heavy   . Scoliosis   . Wilm's tumor     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 47 (XRT and chemo)  . Hyperlipidemia     was on Simvastatin but has been off a year  . Chronic systolic heart failure   . Arthritis   . Chronic back pain   . Shortness of breath     lying flat and with  exertion  . Low back pain     From Radiation damage was told by [facility] that he has multiple fx in his back  . GERD (gastroesophageal reflux disease)     takes Omeprazole daily  . CHF (congestive heart failure)     takes Lasix daily as well Aldactone  . Insomnia     takes Ambien as needed(just got script yesterday)  . Depression     takes Prozac daily    Current Outpatient Prescriptions  Medication Sig Dispense Refill  . aspirin 81 MG tablet Take 81 mg by mouth daily.        . calcium-vitamin D (OSCAL WITH D) 500-200 MG-UNIT per tablet Take 2 tablets by mouth every morning.       . carvedilol (COREG) 12.5 MG tablet Take 1 tablet (12.5 mg total) by mouth 2 (two) times daily with a meal.  60 tablet  6  . digoxin (LANOXIN) 0.125 MG tablet Take 0.125 mg by mouth daily.      Marland Kitchen FLUoxetine (PROZAC) 20 MG capsule Take 1 capsule (20 mg total) by mouth every morning.  30 capsule  6  . furosemide (LASIX) 20 MG tablet Take 1 tablet (20 mg total) by mouth 2 (two) times daily.  60 tablet  0  . HYDROcodone-acetaminophen (NORCO) 7.5-325 MG per tablet Take 1 tablet by mouth 2 (two) times daily  as needed (pain).  60 tablet  0  . lisinopril (PRINIVIL,ZESTRIL) 2.5 MG tablet Take 1 tablet (2.5 mg total) by mouth daily.  30 tablet  6  . spironolactone (ALDACTONE) 25 MG tablet Take 1 tablet (25 mg total) by mouth daily.  30 tablet  6  . zolpidem (AMBIEN) 5 MG tablet Take 1 tablet (5 mg total) by mouth at bedtime as needed for sleep.  15 tablet  1   No current facility-administered medications for this encounter.     PHYSICAL EXAM: Filed Vitals:   01/19/14 1015  BP: 110/70  Pulse: 98  Resp: 18  Weight: 124 lb 4 oz (56.359 kg)  SpO2: 99%    General:  Thin. . No resp difficulty HEENT: normal Neck: supple. JVP 6-7 . Carotids 2+ bilaterally; no bruits. No lymphadenopathy or thryomegaly appreciated. Cor: PMI normal. Regular rate & rhythm. No rubs. Richard 2/6  or murmurs. No S3. L upper chest ICD Lungs:  Decreased breath sounds R base Abdomen: soft, nontender, nondistended. No hepatosplenomegaly. No bruits or masses. Good bowel sounds. Extremities: no cyanosis, clubbing, rash, edema Neuro: alert & orientedx3, cranial nerves grossly intact. Moves all 4 extremities w/o difficulty. Affect pleasant.  ASSESSMENT & PLAN: 1. Chronic Systolic HF:  Nonischemic cardiomyopathy, EF 15%.  [name].   NYHA class II-III.  Stable symptomatically. Volume status stable.  Moderate functional limitation on CPX primarily circulatory in origin.   -has history of hyperkalemia. Continue lisinopril  2.5 mg daily land spironolactone 25 mg daily .  - Continue carvedilol  12.5 mg in am and increase night time carvedilol 18.75 mg. .  - Continue digoxin 0.125 mg daily, level ok in 5/15.  - Continue Lasix 20 mg daily.   2. R pleural effusion: s/p Pleurex. Stable decreased breath sounds right base on exam. Consider repeat CXR.   Follow up 6 weeks  CLEGG,AMY NP-C  01/19/2014  Patient seen and examined with Richard Grinder, NP. We discussed all aspects of the encounter. I agree with the assessment and plan as stated above.   He is doing well. NYHA II-III. Agree with increasing pm carvedilol dosing. Consider repeat CXR at next visit. See back in several weeks for ongoing med titration. May be candida for ivabradine down the road.  Richard [name],MD 12:44 PM

## 2014-01-19 NOTE — Patient Instructions (Signed)
Follow up 6 weeks   Take carvedilol 12.5 mg daily and 18.75 mg at bed time.   Do the following things EVERYDAY: 1) Weigh yourself in the morning before breakfast. Write it down and keep it in a log. 2) Take your medicines as prescribed 3) Eat low salt foods-Limit salt (sodium) to 2000 mg per day.  4) Stay as active as you can everyday 5) Limit all fluids for the day to less than 2 liters

## 2014-01-29 ENCOUNTER — Telehealth: Payer: Self-pay | Admitting: *Deleted

## 2014-01-29 MED ORDER — HYDROCODONE-ACETAMINOPHEN 7.5-325 MG PO TABS: 1.0000 | ORAL_TABLET | Freq: Two times a day (BID) | ORAL | Status: DC | PRN

## 2014-01-29 NOTE — Telephone Encounter (Signed)
Call placed to patient to make aware that prescription for HYdrocodone is due and can be picked up on 02/01/2014.   Left message on VM.

## 2014-03-02 ENCOUNTER — Encounter (HOSPITAL_COMMUNITY): Payer: Self-pay

## 2014-03-02 ENCOUNTER — Ambulatory Visit (HOSPITAL_COMMUNITY)
Admission: RE | Admit: 2014-03-02 | Discharge: 2014-03-02 | Disposition: A | Discharge status: Home / Self Care | Payer: Medicare Other | Source: Ambulatory Visit | Attending: Internal Medicine | Admitting: Internal Medicine

## 2014-03-02 ENCOUNTER — Telehealth (HOSPITAL_COMMUNITY): Payer: Self-pay | Admitting: Vascular Surgery

## 2014-03-02 VITALS — BP 98/62 | HR 97 | Wt 127.8 lb

## 2014-03-02 DIAGNOSIS — I509 Heart failure, unspecified: Secondary | ICD-10-CM

## 2014-03-02 DIAGNOSIS — I5022 Chronic systolic (congestive) heart failure: Secondary | ICD-10-CM | POA: Diagnosis not present

## 2014-03-02 DIAGNOSIS — Z87891 Personal history of nicotine dependence: Secondary | ICD-10-CM | POA: Insufficient documentation

## 2014-03-02 DIAGNOSIS — I428 Other cardiomyopathies: Secondary | ICD-10-CM | POA: Diagnosis not present

## 2014-03-02 DIAGNOSIS — J90 Pleural effusion, not elsewhere classified: Secondary | ICD-10-CM | POA: Diagnosis not present

## 2014-03-02 MED ORDER — CARVEDILOL 12.5 MG PO TABS: 18.7500 mg | ORAL_TABLET | Freq: Two times a day (BID) | ORAL | Status: DC

## 2014-03-02 MED ORDER — CARVEDILOL 12.5 MG PO TABS: ORAL_TABLET | ORAL | Status: DC

## 2014-03-02 NOTE — Telephone Encounter (Signed)
Carvedilol increased at office visit today, rx should read Carvedilol 12.5 mg tablets, take one and one-half tab twice a day New RX sent into pharmacy

## 2014-03-02 NOTE — Patient Instructions (Signed)
Follow up in 1 month with an ECHO   Take 18.75 mg carvedilol twice a day  Do the following things EVERYDAY: 1) Weigh yourself in the morning before breakfast. Write it down and keep it in a log. 2) Take your medicines as prescribed 3) Eat low salt foods-Limit salt (sodium) to 2000 mg per day.  4) Stay as active as you can everyday 5) Limit all fluids for the day to less than 2 liters

## 2014-03-02 NOTE — Progress Notes (Signed)
Patient ID: [name] [name], male   DOB: 19-Sep-1970, 43 y.o.   MRN: [id number]    Primary Cardiologist: Dr Domenic Polite  HPI: [name] [name] is 43 year old with history of NICM (diagnosed 10/2010 - norm cors 4098), chronic systolic heart failure EF 15% (08/2013), S/P ICD [employer], smoker, Wilms tumor s/p nephrectomy at age 47 had chemoradiation, scoliosis.    Admitted to [facility] with dyspnea. Had large R pleural effusion.mThoracentesis 1.2 liters. Discharged on lisinopril 2.5 mg daily, lasix 20 mg twice a day, carvedilol 18.75mg  twice a day. Discharge weight 126 pounds.   We saw him at the end of March 2015 and had large recurrent R pleural effusion. Referred to Dr. Prescott Gum who placed Pleurex catheter on 4/10, removed on 09/28/13. F/u CXR showed only small R effusion.   CPX (5/15) with RER 1.06, VO2 max 18.5, VE/VCO2 slope 32.1.  Moderate to severe functional limitation, circulatory.   He returns for follow up. Last visit night time carvedilol was increased to 18.75 mg . Overall  feeling much better. Denies SOB/PND/Orthopnea/dizziness. Does admit dyspnea with inclined. Able to walk 30 minutes slowly.  Takes his time doing activities. Not smoking (quit in February). Weight at home 124 pounds.  Taking all medications. Disability approved.   ECHO 08/2013 EF 15% RV mildly dilated.    Labs (3/15): K 3.9 Creatinine 0.7 Labs (5/15): K 5.4, creatinine 0.81, digoxin 0.6 Labs (11/12/13) K 4.4 Creatinine 0.81   SH: Lives with his son 34 year old . Unemployed. Disability pending. Drinks one-two beers occasionally. Former smoker quit January 2015. Dips tobacco  FH: Grandfather MI  ROS: All systems negative except as listed in HPI, PMH and Problem List.  Past Medical History  Diagnosis Date  . Cardiomyopathy, nonischemic     takes Digoxin daily and Carvedilol daily  . Tobacco abuse   . Orthostatic hypotension   . Alcohol consumption heavy   . Scoliosis   . Wilm's tumor     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 37 (XRT and  chemo)  . Hyperlipidemia     was on Simvastatin but has been off a year  . Chronic systolic heart failure   . Arthritis   . Chronic back pain   . Shortness of breath     lying flat and with exertion  . Low back pain     From Radiation damage was told by [facility] that he has multiple fx in his back  . GERD (gastroesophageal reflux disease)     takes Omeprazole daily  . CHF (congestive heart failure)     takes Lasix daily as well Aldactone  . Insomnia     takes Ambien as needed(just got script yesterday)  . Depression     takes Prozac daily    Current Outpatient Prescriptions  Medication Sig Dispense Refill  . aspirin 81 MG tablet Take 81 mg by mouth daily.        . calcium-vitamin D (OSCAL WITH D) 500-200 MG-UNIT per tablet Take 2 tablets by mouth every morning.       . carvedilol (COREG) 12.5 MG tablet Take 12.5 mg in am 1 tabs and 18.75 mg in pm 1 1/2 tab  90 tablet  6  . digoxin (LANOXIN) 0.125 MG tablet Take 0.125 mg by mouth daily.      Marland Kitchen FLUoxetine (PROZAC) 20 MG capsule Take 1 capsule (20 mg total) by mouth every morning.  30 capsule  6  . furosemide (LASIX) 20 MG tablet Take 1 tablet (20  mg total) by mouth 2 (two) times daily.  60 tablet  0  . HYDROcodone-acetaminophen (NORCO) 7.5-325 MG per tablet Take 1 tablet by mouth 2 (two) times daily as needed (pain).  60 tablet  0  . lisinopril (PRINIVIL,ZESTRIL) 2.5 MG tablet Take 1 tablet (2.5 mg total) by mouth daily.  30 tablet  6  . spironolactone (ALDACTONE) 25 MG tablet Take 1 tablet (25 mg total) by mouth daily.  30 tablet  6  . zolpidem (AMBIEN) 5 MG tablet Take 1 tablet (5 mg total) by mouth at bedtime as needed for sleep.  15 tablet  1   No current facility-administered medications for this encounter.     PHYSICAL EXAM: Filed Vitals:   03/02/14 1049  BP: 98/62  Pulse: 97  Weight: 127 lb 12.8 oz (57.97 kg)  SpO2: 98%    General:  Thin. . No resp difficulty HEENT: normal Neck: supple. JVP flat. Carotids 2+  bilaterally; no bruits. No lymphadenopathy or thryomegaly appreciated. Cor: PMI normal. Regular rate & rhythm. No rubs. Richard 2/6  or murmurs. No S3. L upper chest ICD Lungs: Decreased breath sounds R base Abdomen: soft, nontender, nondistended. No hepatosplenomegaly. No bruits or masses. Good bowel sounds. Extremities: no cyanosis, clubbing, rash, edema Neuro: alert & orientedx3, cranial nerves grossly intact. Moves all 4 extremities w/o difficulty. Affect pleasant.  ASSESSMENT & PLAN: 1. Chronic Systolic HF:  Nonischemic cardiomyopathy, ECHO 08/2013 EF 15%.  [place].   NYHA class II-III.  Stable symptomatically. Volume status stable.  Moderate functional limitation on CPX primarily circulatory in origin.   -has history of hyperkalemia. Continue lisinopril  2.5 mg daily and spironolactone 25 mg daily .  - Increase carvedilol  18.75 mg in am and continue night time carvedilol 18.75 mg.  - Continue digoxin 0.125 mg daily, level ok in 5/15.  - Continue Lasix 20 mg daily.   2. R pleural effusion: s/p Pleurex. Follow up with Dr Darcey Nora as needed.   Follow up 4 weeks with an ECHO . Check BMET next appointment  RichardAMY NP-C  03/02/2014  Patient seen and examined with Darrick Grinder, NP. We discussed all aspects of the encounter. I agree with the assessment and plan as stated above.   He is improved. Now NYHA II-III but based on exam I suspect LV function remains quite low. Agree with increasing carvedilol as BP tolerates. Repeat echo next month. Continue to follow closely.  Richard Khouri,MD 10:41 AM

## 2014-03-02 NOTE — Telephone Encounter (Signed)
[facility] pharmacy called to verify directions on the Carvedolol 12.5 mg

## 2014-03-04 ENCOUNTER — Telehealth: Payer: Self-pay | Admitting: Family Medicine

## 2014-03-04 NOTE — Telephone Encounter (Signed)
Ok to refill??  Last office visit 09/07/2013.  Last refill 01/29/2014.

## 2014-03-04 NOTE — Telephone Encounter (Signed)
Patient calling requesting rx for pain medication  [phone]

## 2014-03-05 ENCOUNTER — Other Ambulatory Visit (HOSPITAL_COMMUNITY): Payer: Self-pay

## 2014-03-05 DIAGNOSIS — I509 Heart failure, unspecified: Secondary | ICD-10-CM

## 2014-03-05 MED ORDER — HYDROCODONE-ACETAMINOPHEN 7.5-325 MG PO TABS: 1.0000 | ORAL_TABLET | Freq: Two times a day (BID) | ORAL | Status: DC | PRN

## 2014-03-05 MED ORDER — CARVEDILOL 12.5 MG PO TABS: 18.7500 mg | ORAL_TABLET | Freq: Two times a day (BID) | ORAL | Status: DC

## 2014-03-05 NOTE — Telephone Encounter (Signed)
Prescription printed and patient made aware to come to office to pick up via VM.

## 2014-03-05 NOTE — Telephone Encounter (Signed)
Give 1 refill, needs OV before any further

## 2014-03-16 ENCOUNTER — Ambulatory Visit (INDEPENDENT_AMBULATORY_CARE_PROVIDER_SITE_OTHER): Payer: Medicare Other | Admitting: Family Medicine

## 2014-03-16 ENCOUNTER — Encounter: Payer: Self-pay | Admitting: Family Medicine

## 2014-03-16 VITALS — BP 130/74 | HR 72 | Temp 98.3°F | Resp 16 | Ht 66.0 in | Wt 127.0 lb

## 2014-03-16 DIAGNOSIS — M545 Low back pain, unspecified: Secondary | ICD-10-CM

## 2014-03-16 DIAGNOSIS — Z23 Encounter for immunization: Secondary | ICD-10-CM

## 2014-03-16 DIAGNOSIS — E785 Hyperlipidemia, unspecified: Secondary | ICD-10-CM | POA: Diagnosis not present

## 2014-03-16 DIAGNOSIS — I420 Dilated cardiomyopathy: Secondary | ICD-10-CM

## 2014-03-16 DIAGNOSIS — F329 Major depressive disorder, single episode, unspecified: Secondary | ICD-10-CM | POA: Diagnosis not present

## 2014-03-16 DIAGNOSIS — F32A Depression, unspecified: Secondary | ICD-10-CM

## 2014-03-16 DIAGNOSIS — I429 Cardiomyopathy, unspecified: Secondary | ICD-10-CM | POA: Diagnosis not present

## 2014-03-16 NOTE — Assessment & Plan Note (Signed)
Well-controlled on current regimen of hydrocodone no change to dose

## 2014-03-16 NOTE — Patient Instructions (Signed)
Continue current medications F/U  6months  

## 2014-03-16 NOTE — Assessment & Plan Note (Signed)
Review cardiology note. Unfortunately is very poor cardiac function. He will have a repeat echocardiogram a couple weeks.

## 2014-03-16 NOTE — Assessment & Plan Note (Signed)
LDL at goal 6 months ago, has repeat labs in 2 weeks

## 2014-03-16 NOTE — Progress Notes (Signed)
Patient ID: [name], male   DOB: September 13, 1970, 43 y.o.   MRN: [id number]   Subjective:    Patient ID: [name], male    DOB: [birth year], 43 y.o.   MRN: [id number]  Patient presents for F/U  patient here to follow chronic medical problems. At last visit he was admitted secondary to large pleural effusion which he has had drained. He's not had any shortness of breath or chest pain recently. He was seen by cardiology for his followup and is due to have a repeat echocardiogram he is a titration of his carvedilol and has not had any difficulties with this. He does tell me today that he was approved for his disability and this is with a lot of stress off of them. He is sleeping much better knowing that he is improved therefore is not required Ambien has at home. He still taking his Prozac as prescribed and thinks is doing well for him. He also continues to require his pain medication as needed for chronic back pain    Review Of Systems:  GEN- denies fatigue, fever, weight loss,weakness, recent illness HEENT- denies eye drainage, change in vision, nasal discharge, CVS- denies chest pain, palpitations RESP- denies SOB, cough, wheeze ABD- denies N/V, change in stools, abd pain GU- denies dysuria, hematuria, dribbling, incontinence MSK- + joint pain, muscle aches, injury Neuro- denies headache, dizziness, syncope, seizure activity       Objective:    BP 130/74  Pulse 72  Temp(Src) 98.3 F (36.8 C) (Oral)  Resp 16  Ht 5\' 6"  (1.676 m)  Wt 127 lb (57.607 kg)  BMI 20.51 kg/m2 GEN- NAD, alert and oriented x3 HEENT- PERRL, EOMI, non injected sclera, pink conjunctiva, MMM, oropharynx clear CVS- RRR, no murmur RESP-CTAB EXT- No edema Psych- normal affect and mood Pulses- Radial 2+        Assessment & Plan:      Problem List Items Addressed This Visit   None    Visit Diagnoses   Need for prophylactic vaccination and inoculation against influenza    -  Primary    Relevant  Orders       Flu Vaccine QUAD 36+ mos PF IM (Fluarix Quad PF) (Completed)       Note: This dictation was prepared with Dragon dictation along with smaller phrase technology. Any transcriptional errors that result from this process are unintentional.

## 2014-03-16 NOTE — Assessment & Plan Note (Signed)
He is doing well with his current dose of Prozac no change in medications. Getting disability as listed great weight off of him

## 2014-04-01 ENCOUNTER — Ambulatory Visit (HOSPITAL_COMMUNITY)
Admission: RE | Admit: 2014-04-01 | Discharge: 2014-04-01 | Disposition: A | Discharge status: Home / Self Care | Payer: Medicare Other | Source: Ambulatory Visit | Attending: Family Medicine | Admitting: Family Medicine

## 2014-04-01 ENCOUNTER — Encounter (HOSPITAL_COMMUNITY): Payer: Self-pay

## 2014-04-01 ENCOUNTER — Ambulatory Visit (HOSPITAL_BASED_OUTPATIENT_CLINIC_OR_DEPARTMENT_OTHER)
Admission: RE | Admit: 2014-04-01 | Discharge: 2014-04-01 | Disposition: A | Discharge status: Home / Self Care | Payer: Medicare Other | Source: Ambulatory Visit | Attending: Cardiology | Admitting: Cardiology

## 2014-04-01 VITALS — BP 100/78 | HR 85 | Wt 127.8 lb

## 2014-04-01 DIAGNOSIS — I5022 Chronic systolic (congestive) heart failure: Secondary | ICD-10-CM

## 2014-04-01 DIAGNOSIS — F17210 Nicotine dependence, cigarettes, uncomplicated: Secondary | ICD-10-CM | POA: Insufficient documentation

## 2014-04-01 DIAGNOSIS — Z95810 Presence of automatic (implantable) cardiac defibrillator: Secondary | ICD-10-CM | POA: Diagnosis not present

## 2014-04-01 DIAGNOSIS — C649 Malignant neoplasm of unspecified kidney, except renal pelvis: Secondary | ICD-10-CM | POA: Diagnosis not present

## 2014-04-01 DIAGNOSIS — Z9221 Personal history of antineoplastic chemotherapy: Secondary | ICD-10-CM | POA: Insufficient documentation

## 2014-04-01 DIAGNOSIS — Z923 Personal history of irradiation: Secondary | ICD-10-CM | POA: Diagnosis not present

## 2014-04-01 DIAGNOSIS — I420 Dilated cardiomyopathy: Secondary | ICD-10-CM

## 2014-04-01 DIAGNOSIS — I951 Orthostatic hypotension: Secondary | ICD-10-CM | POA: Diagnosis not present

## 2014-04-01 DIAGNOSIS — Z7982 Long term (current) use of aspirin: Secondary | ICD-10-CM | POA: Insufficient documentation

## 2014-04-01 DIAGNOSIS — F329 Major depressive disorder, single episode, unspecified: Secondary | ICD-10-CM | POA: Insufficient documentation

## 2014-04-01 DIAGNOSIS — E785 Hyperlipidemia, unspecified: Secondary | ICD-10-CM | POA: Diagnosis not present

## 2014-04-01 DIAGNOSIS — K219 Gastro-esophageal reflux disease without esophagitis: Secondary | ICD-10-CM | POA: Insufficient documentation

## 2014-04-01 DIAGNOSIS — I369 Nonrheumatic tricuspid valve disorder, unspecified: Secondary | ICD-10-CM

## 2014-04-01 DIAGNOSIS — Z905 Acquired absence of kidney: Secondary | ICD-10-CM | POA: Insufficient documentation

## 2014-04-01 DIAGNOSIS — I429 Cardiomyopathy, unspecified: Secondary | ICD-10-CM | POA: Insufficient documentation

## 2014-04-01 DIAGNOSIS — J90 Pleural effusion, not elsewhere classified: Secondary | ICD-10-CM | POA: Diagnosis not present

## 2014-04-01 DIAGNOSIS — M419 Scoliosis, unspecified: Secondary | ICD-10-CM | POA: Diagnosis not present

## 2014-04-01 LAB — BASIC METABOLIC PANEL
ANION GAP: 12 (ref 5–15)
BUN: 9 mg/dL (ref 6–23)
CALCIUM: 10 mg/dL (ref 8.4–10.5)
CHLORIDE: 97 meq/L (ref 96–112)
CO2: 28 meq/L (ref 19–32)
Creatinine, Ser: 0.94 mg/dL (ref 0.50–1.35)
GFR calc Af Amer: >90 mL/min (ref >90)
GFR calc non Af Amer: >90 mL/min (ref >90)
Glucose, Bld: 87 mg/dL (ref 70–99)
Potassium: 4.6 mEq/L (ref 3.7–5.3)
SODIUM: 137 meq/L (ref 137–147)

## 2014-04-01 LAB — DIGOXIN LEVEL: DIGOXIN LVL: 0.6 ng/mL — AB (ref 0.8–2.0)

## 2014-04-01 MED ORDER — IVABRADINE HCL 5 MG PO TABS: 2.5000 mg | ORAL_TABLET | Freq: Two times a day (BID) | ORAL | Status: DC

## 2014-04-01 NOTE — Patient Instructions (Addendum)
   Please begin to take Corlanor 2.5 mg twice daily Follow -up in 3 months with [facility]

## 2014-04-01 NOTE — Progress Notes (Signed)
Patient ID: [name], male   DOB: 1971/02/28, 43 y.o.   MRN: [id number]    Primary Cardiologist: Dr Domenic Polite  HPI: [name] is 43 year old with history of NICM (diagnosed 10/2010 - norm cors 3244), chronic systolic heart failure EF 15% (08/2013), S/P ICD [employer], smoker, Wilms tumor s/p nephrectomy at age 76 had chemoradiation, scoliosis.    Admitted to [facility] with dyspnea. Had large R pleural effusion.mThoracentesis 1.2 liters. Discharged on lisinopril 2.5 mg daily, lasix 20 mg twice a day, carvedilol 18.75mg  twice a day. Discharge weight 126 pounds.   We saw him at the end of March 2015 and had large recurrent R pleural effusion. Referred to Dr. Prescott Gum who placed Pleurex catheter on 4/10, removed on 09/28/13. F/u CXR showed only small R effusion.   CPX (5/15) with RER 1.06, VO2 max 18.5, VE/VCO2 slope 32.1.  Moderate to severe functional limitation, circulatory.   He returns for follow up. Last visit he continued 18.75 mg carvedilol in am and was instructed to increase night time carvedilol to 18.75 mg  however he did not tolerate due to increased fatigue. He says went back to 12.5 mg at night and felt better. Denies SOB/PND/Orthopnea. Takes his time. Able to walk up steps. Weight at home 124-126 pounds. Taking all medications. Quit smoking 1 year ago.   ECHO 08/2013 EF 15% RV mildly dilated.    Labs (3/15): K 3.9 Creatinine 0.7 Labs (5/15): K 5.4, creatinine 0.81, digoxin 0.6 Labs (11/12/13) K 4.4 Creatinine 0.81   SH: Lives with his son 41 year old . Unemployed. Disability pending. Drinks one-two beers occasionally. Former smoker quit January 2015. Dips tobacco  FH: Grandfather MI  ROS: All systems negative except as listed in HPI, PMH and Problem List.  Past Medical History  Diagnosis Date  . Cardiomyopathy, nonischemic     takes Digoxin daily and Carvedilol daily  . Tobacco abuse   . Orthostatic hypotension   . Alcohol consumption heavy   . Scoliosis   . Wilm's  tumor     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 43 (XRT and chemo)  . Hyperlipidemia     was on Simvastatin but has been off a year  . Chronic systolic heart failure   . Arthritis   . Chronic back pain   . Shortness of breath     lying flat and with exertion  . Low back pain     From Radiation damage was told by [facility] that he has multiple fx in his back  . GERD (gastroesophageal reflux disease)     takes Omeprazole daily  . CHF (congestive heart failure)     takes Lasix daily as well Aldactone  . Insomnia     takes Ambien as needed(just got script yesterday)  . Depression     takes Prozac daily    Current Outpatient Prescriptions  Medication Sig Dispense Refill  . aspirin 81 MG tablet Take 81 mg by mouth daily.        . calcium-vitamin D (OSCAL WITH D) 500-200 MG-UNIT per tablet Take 2 tablets by mouth every morning.       . carvedilol (COREG) 12.5 MG tablet Take 12.5 mg by mouth 2 (two) times daily with a meal. Take 18.75 mg in am and 12.5 mg in pm      . digoxin (LANOXIN) 0.125 MG tablet Take 0.125 mg by mouth daily.      Marland Kitchen FLUoxetine (PROZAC) 20 MG capsule Take 1 capsule (20 mg total)  by mouth every morning.  30 capsule  6  . furosemide (LASIX) 20 MG tablet Take 20 mg by mouth daily.      Marland Kitchen HYDROcodone-acetaminophen (NORCO) 7.5-325 MG per tablet Take 1 tablet by mouth 2 (two) times daily as needed (pain).  60 tablet  0  . lisinopril (PRINIVIL,ZESTRIL) 2.5 MG tablet Take 1 tablet (2.5 mg total) by mouth daily.  30 tablet  6  . spironolactone (ALDACTONE) 25 MG tablet Take 1 tablet (25 mg total) by mouth daily.  30 tablet  6  . zolpidem (AMBIEN) 5 MG tablet Take 1 tablet (5 mg total) by mouth at bedtime as needed for sleep.  15 tablet  1  . ivabradine HCl (CORLANOR) 5 MG TABS tablet Take 0.5 tablets (2.5 mg total) by mouth 2 (two) times daily with a meal.  60 tablet  2   No current facility-administered medications for this encounter.     PHYSICAL EXAM: Filed Vitals:   04/01/14 1357  BP:  100/78  Pulse: 85  Weight: 127 lb 12.8 oz (57.97 kg)  SpO2: 95%    General:  Thin.  No resp difficulty HEENT: normal Neck: supple. JVP flat. Carotids 2+ bilaterally; no bruits. No lymphadenopathy or thryomegaly appreciated. Cor: PMI normal. Regular rate & rhythm. No rubs. Richard 2/6  or murmurs. No S3. L upper chest ICD Lungs: Clear Abdomen: soft, nontender, nondistended. No hepatosplenomegaly. No bruits or masses. Good bowel sounds. Extremities: no cyanosis, clubbing, rash, edema Neuro: alert & orientedx3, cranial nerves grossly intact. Moves all 4 extremities w/o difficulty. Affect pleasant.  ASSESSMENT & PLAN: 1. Chronic Systolic HF:  Nonischemic cardiomyopathy, ECHO 08/2013 EF 15%.  [place].   NYHA class II.  Functionally improved.  Volume status stable.  Moderate functional limitation on CPX primarily circulatory in origin.   ECHO today will call with results.    - heart rate 85 will add corlanor 2.5 mg twice a day.  - Continue lisinopril  2.5 mg daily and spironolactone 25 mg daily. Check BMET today  - Continue carvedilol  18.75 mg in am  12.5 mg in pm. Intolerant of up titration of night time carvedilol.   - Continue digoxin 0.125 mg daily, level ok in 5/15. Check dig level today - Continue Lasix 20 mg daily.   2. R pleural effusion: s/p Pleurex. Follow up with Dr Darcey Nora as needed.   Follow up in 3 months   Australia Droll NP-C  04/01/2014

## 2014-04-02 NOTE — Progress Notes (Signed)
*  PRELIMINARY RESULTS* Echocardiogram 2D Echocardiogram has been performed.  Leavy Cella 04/02/2014, 2:07 PM

## 2014-04-07 ENCOUNTER — Ambulatory Visit (INDEPENDENT_AMBULATORY_CARE_PROVIDER_SITE_OTHER): Payer: Medicare Other | Admitting: Internal Medicine

## 2014-04-07 ENCOUNTER — Encounter: Payer: Self-pay | Admitting: Internal Medicine

## 2014-04-07 VITALS — BP 122/80 | HR 96 | Ht 66.0 in | Wt 127.0 lb

## 2014-04-07 DIAGNOSIS — Z720 Tobacco use: Secondary | ICD-10-CM

## 2014-04-07 DIAGNOSIS — I420 Dilated cardiomyopathy: Secondary | ICD-10-CM

## 2014-04-07 DIAGNOSIS — Z95810 Presence of automatic (implantable) cardiac defibrillator: Secondary | ICD-10-CM

## 2014-04-07 DIAGNOSIS — I5022 Chronic systolic (congestive) heart failure: Secondary | ICD-10-CM

## 2014-04-07 DIAGNOSIS — I429 Cardiomyopathy, unspecified: Secondary | ICD-10-CM

## 2014-04-07 LAB — MDC_IDC_ENUM_SESS_TYPE_INCLINIC
Battery Remaining Longevity: 127 mo
Brady Statistic RV Percent Paced: 1 %
HIGH POWER IMPEDANCE MEASURED VALUE: 67 Ohm
HighPow Impedance: 76 Ohm
Implantable Pulse Generator Serial Number: 118994
Lead Channel Impedance Value: 522 Ohm
Lead Channel Pacing Threshold Amplitude: 0.7 V
Lead Channel Sensing Intrinsic Amplitude: 15 mV
Lead Channel Setting Pacing Pulse Width: 0.4 ms
Lead Channel Setting Sensing Sensitivity: 0.4 mV
MDC IDC MSMT LEADCHNL RV PACING THRESHOLD PULSEWIDTH: 0.4 ms
MDC IDC SESS DTM: 20151028040000
MDC IDC SET LEADCHNL RV PACING AMPLITUDE: 2.4 V
Zone Setting Detection Interval: 250 ms
Zone Setting Detection Interval: 316 ms

## 2014-04-07 NOTE — Patient Instructions (Signed)
Your physician recommends that you schedule a follow-up appointment in: 3 months with Nevin Bloodgood and 1 Year with Dr. Lovena Le  Your physician recommends that you continue on your current medications as directed. Please refer to the Current Medication list given to you today.    Thank you for choosing [facility]!!

## 2014-04-07 NOTE — Assessment & Plan Note (Signed)
His symptoms remain class 2A. He will continue his current meds.  

## 2014-04-07 NOTE — Progress Notes (Signed)
HPI [name] returns today for followup. He is a very pleasant 43 year old man with a nonischemic cardiomyopathy, chronic systolic heart failure, status post ICD implantation. He has been bother some with musculoskeletal chest pain. He has kyphoscoliosis. He received extensive radiation when he was young for treatment of a Wilms tumor. The patient denies syncope, chest pain, or peripheral edema. Despite his multiple problems, he remains active. He has class 2A symptoms. He has been unable to take an ACE inhibitor or an ARB due to hypotension. He had a pleural effusion drained with a chest tube several months ago. Allergies  Allergen Reactions  . Ibuprofen Hives     Current Outpatient Prescriptions  Medication Sig Dispense Refill  . aspirin 81 MG tablet Take 81 mg by mouth daily.        . calcium-vitamin D (OSCAL WITH D) 500-200 MG-UNIT per tablet Take 2 tablets by mouth every morning.       . carvedilol (COREG) 12.5 MG tablet Take 12.5 mg by mouth 2 (two) times daily with a meal. Take 18.75 mg in am and 12.5 mg in pm      . digoxin (LANOXIN) 0.125 MG tablet Take 0.125 mg by mouth daily.      Marland Kitchen FLUoxetine (PROZAC) 20 MG capsule Take 1 capsule (20 mg total) by mouth every morning.  30 capsule  6  . furosemide (LASIX) 20 MG tablet Take 20 mg by mouth daily.      Marland Kitchen HYDROcodone-acetaminophen (NORCO) 7.5-325 MG per tablet Take 1 tablet by mouth 2 (two) times daily as needed (pain).  60 tablet  0  . ivabradine HCl (CORLANOR) 5 MG TABS tablet Take 0.5 tablets (2.5 mg total) by mouth 2 (two) times daily with a meal.  60 tablet  2  . lisinopril (PRINIVIL,ZESTRIL) 2.5 MG tablet Take 1 tablet (2.5 mg total) by mouth daily.  30 tablet  6  . spironolactone (ALDACTONE) 25 MG tablet Take 1 tablet (25 mg total) by mouth daily.  30 tablet  6  . zolpidem (AMBIEN) 5 MG tablet Take 1 tablet (5 mg total) by mouth at bedtime as needed for sleep.  15 tablet  1   No current facility-administered medications for this  visit.     Past Medical History  Diagnosis Date  . Cardiomyopathy, nonischemic     takes Digoxin daily and Carvedilol daily  . Tobacco abuse   . Orthostatic hypotension   . Alcohol consumption heavy   . Scoliosis   . Wilm's tumor     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 60 (XRT and chemo)  . Hyperlipidemia     was on Simvastatin but has been off a year  . Chronic systolic heart failure   . Arthritis   . Chronic back pain   . Shortness of breath     lying flat and with exertion  . Low back pain     From Radiation damage was told by [facility] that he has multiple fx in his back  . GERD (gastroesophageal reflux disease)     takes Omeprazole daily  . CHF (congestive heart failure)     takes Lasix daily as well Aldactone  . Insomnia     takes Ambien as needed(just got script yesterday)  . Depression     takes Prozac daily    ROS:   All systems reviewed and negative except as noted in the HPI.   Past Surgical History  Procedure Laterality Date  . Nephrectomy  36 yrs ago  Wilms Tumor  . Icd  05/25/2011    [employer] Endotak [employer] SG lead/Energen single chamber device  . Carpal tunnel release Right 01/09/2013    Procedure: CARPAL TUNNEL RELEASE;  Surgeon: Winfield Cunas, MD;  Location: [place] [facility];  Service: Neurosurgery;  Laterality: Right;  RIGHT carpal tunnel release  . Ulnar nerve transposition Right 01/09/2013    Procedure: ULNAR NERVE DECOMPRESSION/TRANSPOSITION;  Surgeon: Winfield Cunas, MD;  Location: [facility];  Service: Neurosurgery;  Laterality: Right;  RIGHT ulnar nerve decompression  . Carpal tunnel release Left 01/30/2013    Procedure: LEFT CARPAL TUNNEL RELEASE;  Surgeon: Winfield Cunas, MD;  Location: [place] [facility];  Service: Neurosurgery;  Laterality: Left;  LEFT Carpal Tunnel release  . Chest tube insertion Right 09/09/2013    Procedure: INSERTION PLEURAL DRAINAGE CATHETER;  Surgeon: Ivin Poot, MD;  Location: [place];  Service: Thoracic;  Laterality: Right;      No family history on file.   History   Social History  . Marital Status: Legally Separated    Spouse Name: N/A    Number of Children: 1  . Years of Education: N/A   Occupational History  . Full time     Engineer, civil (consulting)   Social History Main Topics  . Smoking status: Former Smoker -- 0.25 packs/day for 20 years    Types: Cigarettes  . Smokeless tobacco: Current User    Types: Snuff, Chew     Comment: quit smoking the 1st of Jan 2015  . Alcohol Use: No  . Drug Use: No  . Sexual Activity: Not Currently   Other Topics Concern  . Not on file   Social History Narrative  . No narrative on file     There were no vitals taken for this visit.  Physical Exam:  Well appearing 43 year old man, NAD HEENT: Unremarkable Neck:  No JVD, no thyromegally Back:  No CVA tenderness, marked kyphoscoliosis. Lungs:  Clear with no wheezes, rales, or rhonchi. Well-healed ICD incision. HEART:  Regular rate rhythm, no murmurs, no rubs, no clicks Abd:  soft, scaphoid, positive bowel sounds, no organomegally, no rebound, no guarding Ext:  2 plus pulses, no edema, no cyanosis, no clubbing Skin:  No rashes no nodules Neuro:  CN II through XII intact, motor grossly intact  DEVICE  Normal device function.  See PaceArt for details.   Assess/Plan:

## 2014-04-07 NOTE — Assessment & Plan Note (Signed)
He has finally stopped smoking.

## 2014-04-07 NOTE — Assessment & Plan Note (Signed)
His Boston Sci device is working normally. Will recheck in several months. 

## 2014-04-08 ENCOUNTER — Encounter: Payer: Self-pay | Admitting: Internal Medicine

## 2014-04-08 LAB — PACEMAKER DEVICE OBSERVATION

## 2014-04-12 ENCOUNTER — Telehealth: Payer: Self-pay | Admitting: Family Medicine

## 2014-04-12 MED ORDER — HYDROCODONE-ACETAMINOPHEN 7.5-325 MG PO TABS: 1.0000 | ORAL_TABLET | Freq: Two times a day (BID) | ORAL | Status: DC | PRN

## 2014-04-12 NOTE — Telephone Encounter (Signed)
Patient would like to know if we can refill norco  [phone]

## 2014-04-12 NOTE — Telephone Encounter (Signed)
Ok to refill??  Last office visit 03/16/2014.  Last refill 03/05/2014.

## 2014-04-12 NOTE — Telephone Encounter (Signed)
Prescription printed and patient made aware to come to office to pick up.  

## 2014-04-12 NOTE — Telephone Encounter (Signed)
Okay to refill? 

## 2014-05-12 ENCOUNTER — Telehealth: Payer: Self-pay | Admitting: Family Medicine

## 2014-05-12 ENCOUNTER — Telehealth (HOSPITAL_COMMUNITY): Payer: Self-pay | Admitting: Adult Health

## 2014-05-12 MED ORDER — HYDROCODONE-ACETAMINOPHEN 7.5-325 MG PO TABS: 1.0000 | ORAL_TABLET | Freq: Two times a day (BID) | ORAL | Status: DC | PRN

## 2014-05-12 NOTE — Telephone Encounter (Signed)
Ok to refill??  Last office visit 03/16/2014.  Last refill 04/12/2014.

## 2014-05-12 NOTE — Telephone Encounter (Signed)
Call placed to patient and patient made aware via VM. 

## 2014-05-12 NOTE — Telephone Encounter (Signed)
Called [name] and left a message to return call.    When he returns call please inform his EF remains 10% and we would like to set up CPX test.   [name] NP-C  1:58 PM

## 2014-05-12 NOTE — Telephone Encounter (Signed)
Okay to refill? 

## 2014-05-12 NOTE — Telephone Encounter (Signed)
Patient requesting refill on norco  [phone]

## 2014-05-19 ENCOUNTER — Encounter (HOSPITAL_COMMUNITY): Payer: Self-pay | Admitting: Internal Medicine

## 2014-05-21 ENCOUNTER — Telehealth (HOSPITAL_COMMUNITY): Payer: Self-pay | Admitting: Adult Health

## 2014-05-21 NOTE — Telephone Encounter (Signed)
Attempted to call regarding ECHO however his phone number is disconnected.   [name] 12:31 PM

## 2014-06-05 ENCOUNTER — Other Ambulatory Visit (HOSPITAL_COMMUNITY): Payer: Self-pay | Admitting: Internal Medicine

## 2014-06-05 ENCOUNTER — Other Ambulatory Visit: Payer: Self-pay | Admitting: Family Medicine

## 2014-06-05 ENCOUNTER — Other Ambulatory Visit (HOSPITAL_COMMUNITY): Payer: Self-pay | Admitting: Adult Health

## 2014-06-07 ENCOUNTER — Other Ambulatory Visit: Payer: Self-pay | Admitting: Internal Medicine

## 2014-06-07 ENCOUNTER — Other Ambulatory Visit: Payer: Self-pay | Admitting: *Deleted

## 2014-06-07 MED ORDER — FUROSEMIDE 20 MG PO TABS: 20.0000 mg | ORAL_TABLET | Freq: Every day | ORAL | Status: DC

## 2014-06-07 MED ORDER — HYDROCODONE-ACETAMINOPHEN 7.5-325 MG PO TABS: 1.0000 | ORAL_TABLET | Freq: Two times a day (BID) | ORAL | Status: DC | PRN

## 2014-06-07 NOTE — Telephone Encounter (Signed)
PATIENT NEEDS TO MAKE AN APPOINTMENT TO GET FURTHER REFILLS,

## 2014-06-07 NOTE — Telephone Encounter (Signed)
Records indicate prescription refill appropriate for Hydrocodone.   Prescription printed and patient made aware to come to office to pick up per VM.

## 2014-06-07 NOTE — Telephone Encounter (Signed)
Refill complete 

## 2014-06-07 NOTE — Telephone Encounter (Signed)
Received fax refill request  Rx # [id number] Medication:  Lasix 20 mg tab Qty 30 Sig:  Take one tablet by mouth once daily  Physician:  Lovena Le

## 2014-07-07 ENCOUNTER — Ambulatory Visit (INDEPENDENT_AMBULATORY_CARE_PROVIDER_SITE_OTHER): Payer: Medicare HMO | Admitting: *Deleted

## 2014-07-07 DIAGNOSIS — I420 Dilated cardiomyopathy: Secondary | ICD-10-CM

## 2014-07-07 DIAGNOSIS — I429 Cardiomyopathy, unspecified: Secondary | ICD-10-CM

## 2014-07-07 LAB — MDC_IDC_ENUM_SESS_TYPE_INCLINIC
HIGH POWER IMPEDANCE MEASURED VALUE: 86 Ohm
HighPow Impedance: 67 Ohm
Implantable Pulse Generator Serial Number: 118994
Lead Channel Impedance Value: 561 Ohm
Lead Channel Pacing Threshold Amplitude: 0.5 V
Lead Channel Pacing Threshold Pulse Width: 0.4 ms
Lead Channel Setting Pacing Amplitude: 2.4 V
Lead Channel Setting Pacing Pulse Width: 0.4 ms
Lead Channel Setting Sensing Sensitivity: 0.4 mV
MDC IDC MSMT LEADCHNL RV SENSING INTR AMPL: 18.6 mV
MDC IDC SESS DTM: 20160127050000
MDC IDC SET ZONE DETECTION INTERVAL: 250 ms
MDC IDC SET ZONE DETECTION INTERVAL: 316 ms

## 2014-07-07 NOTE — Progress Notes (Signed)
ICD check in clinic. Battery longevity 11.5 years. Normal device function. 2 nst episodes recorded. VP <1%. ROV in 3 mths w/device clinic (recall put in) and October with GT.

## 2014-07-12 ENCOUNTER — Telehealth: Payer: Self-pay | Admitting: Family Medicine

## 2014-07-12 MED ORDER — HYDROCODONE-ACETAMINOPHEN 7.5-325 MG PO TABS: 1.0000 | ORAL_TABLET | Freq: Two times a day (BID) | ORAL | Status: DC | PRN

## 2014-07-12 NOTE — Telephone Encounter (Signed)
[phone] PT is needing  A refill on HYDROcodone-acetaminophen (NORCO) 7.5-325 MG per tablet

## 2014-07-12 NOTE — Telephone Encounter (Signed)
Prescription printed and patient made aware to come to office to pick up.  

## 2014-07-12 NOTE — Telephone Encounter (Signed)
Ok to refill??  Last office visit 03/16/2014.  Last refill 06/07/2014.

## 2014-07-12 NOTE — Telephone Encounter (Signed)
Okay to refill? 

## 2014-07-15 ENCOUNTER — Encounter: Payer: Self-pay | Admitting: Internal Medicine

## 2014-07-15 ENCOUNTER — Telehealth: Payer: Self-pay | Admitting: Family Medicine

## 2014-07-15 ENCOUNTER — Other Ambulatory Visit: Payer: Self-pay | Admitting: Family Medicine

## 2014-07-15 ENCOUNTER — Other Ambulatory Visit (HOSPITAL_COMMUNITY): Payer: Self-pay | Admitting: Adult Health

## 2014-07-15 NOTE — Telephone Encounter (Signed)
Refill appropriate and filled per protocol. 

## 2014-07-27 ENCOUNTER — Other Ambulatory Visit (HOSPITAL_COMMUNITY): Payer: Self-pay | Admitting: Cardiology

## 2014-07-27 MED ORDER — LISINOPRIL 2.5 MG PO TABS: 2.5000 mg | ORAL_TABLET | Freq: Every day | ORAL | Status: DC

## 2014-08-16 ENCOUNTER — Telehealth: Payer: Self-pay | Admitting: Family Medicine

## 2014-08-16 MED ORDER — HYDROCODONE-ACETAMINOPHEN 7.5-325 MG PO TABS: 1.0000 | ORAL_TABLET | Freq: Two times a day (BID) | ORAL | Status: DC | PRN

## 2014-08-16 NOTE — Telephone Encounter (Signed)
Okay to refll

## 2014-08-16 NOTE — Telephone Encounter (Signed)
Prescription printed and patient made aware to come to office to pick up per VM.  

## 2014-08-16 NOTE — Telephone Encounter (Signed)
Ok to refill??  Last office visit 03/16/2014.  Last refill 07/12/2014.

## 2014-08-16 NOTE — Telephone Encounter (Signed)
Patient calling for refill on norco  [phone]

## 2014-08-31 ENCOUNTER — Other Ambulatory Visit: Payer: Self-pay | Admitting: Family Medicine

## 2014-09-01 NOTE — Telephone Encounter (Signed)
Medication refilled per protocol. 

## 2014-09-06 ENCOUNTER — Other Ambulatory Visit (HOSPITAL_COMMUNITY): Payer: Self-pay

## 2014-09-06 MED ORDER — SPIRONOLACTONE 25 MG PO TABS: 25.0000 mg | ORAL_TABLET | Freq: Every day | ORAL | Status: DC

## 2014-09-13 ENCOUNTER — Encounter: Payer: Self-pay | Admitting: Family Medicine

## 2014-09-15 ENCOUNTER — Ambulatory Visit (INDEPENDENT_AMBULATORY_CARE_PROVIDER_SITE_OTHER): Payer: Medicare HMO | Admitting: Family Medicine

## 2014-09-15 ENCOUNTER — Encounter: Payer: Self-pay | Admitting: Family Medicine

## 2014-09-15 VITALS — BP 126/76 | HR 78 | Temp 98.5°F | Resp 16 | Ht 66.0 in | Wt 129.0 lb

## 2014-09-15 DIAGNOSIS — F329 Major depressive disorder, single episode, unspecified: Secondary | ICD-10-CM | POA: Diagnosis not present

## 2014-09-15 DIAGNOSIS — I5022 Chronic systolic (congestive) heart failure: Secondary | ICD-10-CM

## 2014-09-15 DIAGNOSIS — G894 Chronic pain syndrome: Secondary | ICD-10-CM

## 2014-09-15 DIAGNOSIS — F32A Depression, unspecified: Secondary | ICD-10-CM

## 2014-09-15 DIAGNOSIS — R7302 Impaired glucose tolerance (oral): Secondary | ICD-10-CM

## 2014-09-15 DIAGNOSIS — E785 Hyperlipidemia, unspecified: Secondary | ICD-10-CM

## 2014-09-15 MED ORDER — HYDROCODONE-ACETAMINOPHEN 7.5-325 MG PO TABS: 1.0000 | ORAL_TABLET | Freq: Two times a day (BID) | ORAL | Status: DC | PRN

## 2014-09-15 NOTE — Assessment & Plan Note (Signed)
Currently compensated , no change to meds, follows with cardiology Fasting labs to be done

## 2014-09-15 NOTE — Progress Notes (Signed)
Patient ID: [name], male   DOB: [birth year], 44 y.o.   MRN: [id number]   Subjective:    Patient ID: [name], male    DOB: 07/14/1970, 44 y.o.   MRN: [id number]  Patient presents for 6 month F/U   Pt here to f/u, he has no concerns today, states he is feeling well, mentally and physically. He had ACID check in January which was normal, history of severe CHF with non ischemic dilated cardiomyopathy.  Medications reviewed, due for fasting labs Request refill on chronic pain medications    Review Of Systems:  GEN- denies fatigue, fever, weight loss,weakness, recent illness HEENT- denies eye drainage, change in vision, nasal discharge, CVS- denies chest pain, palpitations RESP- denies SOB, cough, wheeze ABD- denies N/V, change in stools, abd pain GU- denies dysuria, hematuria, dribbling, incontinence MSK- + joint pain, muscle aches, injury Neuro- denies headache, dizziness, syncope, seizure activity       Objective:    BP 126/76 mmHg  Pulse 78  Temp(Src) 98.5 F (36.9 C) (Oral)  Resp 16  Ht 5\' 6"  (1.676 m)  Wt 129 lb (58.514 kg)  BMI 20.83 kg/m2 GEN- NAD, alert and oriented x3 HEENT- PERRL, EOMI, non injected sclera, pink conjunctiva, MMM, oropharynx clear Neck- Supple, no thyromegaly CVS- RRR, no murmur RESP-CTAB Psych- Normal affect and mood EXT- No edema Pulses- Radial  2+        Assessment & Plan:      Problem List Items Addressed This Visit    None      Note: This dictation was prepared with Dragon dictation along with smaller phrase technology. Any transcriptional errors that result from this process are unintentional.

## 2014-09-15 NOTE — Patient Instructions (Signed)
Continue current medications Get the labs done fasting F/U 6 months for PHYSICAL

## 2014-09-15 NOTE — Assessment & Plan Note (Signed)
Currently stable, he is much improved since he now has disability and not financially strained Rare use of Ambien, therefore will not refill at this time

## 2014-09-15 NOTE — Assessment & Plan Note (Signed)
Pain medications refilled, doing well on meds

## 2014-09-21 LAB — COMPREHENSIVE METABOLIC PANEL
ALT: 36 U/L (ref 0–53)
AST: 48 U/L — AB (ref 0–37)
Albumin: 3.8 g/dL (ref 3.5–5.2)
Alkaline Phosphatase: 68 U/L (ref 39–117)
BUN: 6 mg/dL (ref 6–23)
CALCIUM: 9.3 mg/dL (ref 8.4–10.5)
CHLORIDE: 100 meq/L (ref 96–112)
CO2: 30 mEq/L (ref 19–32)
CREATININE: 0.88 mg/dL (ref 0.50–1.35)
Glucose, Bld: 97 mg/dL (ref 70–99)
Potassium: 4.6 mEq/L (ref 3.5–5.3)
SODIUM: 136 meq/L (ref 135–145)
TOTAL PROTEIN: 6.9 g/dL (ref 6.0–8.3)
Total Bilirubin: 0.6 mg/dL (ref 0.2–1.2)

## 2014-09-21 LAB — CBC WITH DIFFERENTIAL/PLATELET
Basophils Absolute: 0 10*3/uL (ref 0.0–0.1)
Basophils Relative: 1 % (ref 0–1)
EOS PCT: 2 % (ref 0–5)
Eosinophils Absolute: 0.1 10*3/uL (ref 0.0–0.7)
HCT: 43.8 % (ref 39.0–52.0)
HEMOGLOBIN: 14.7 g/dL (ref 13.0–17.0)
LYMPHS ABS: 1.1 10*3/uL (ref 0.7–4.0)
Lymphocytes Relative: 30 % (ref 12–46)
MCH: 32.3 pg (ref 26.0–34.0)
MCHC: 33.6 g/dL (ref 30.0–36.0)
MCV: 96.3 fL (ref 78.0–100.0)
MONOS PCT: 15 % — AB (ref 3–12)
MPV: 9.9 fL (ref 8.6–12.4)
Monocytes Absolute: 0.6 10*3/uL (ref 0.1–1.0)
Neutro Abs: 1.9 10*3/uL (ref 1.7–7.7)
Neutrophils Relative %: 52 % (ref 43–77)
Platelets: 237 10*3/uL (ref 150–400)
RBC: 4.55 MIL/uL (ref 4.22–5.81)
RDW: 14.6 % (ref 11.5–15.5)
WBC: 3.7 10*3/uL — ABNORMAL LOW (ref 4.0–10.5)

## 2014-09-21 LAB — LIPID PANEL
Cholesterol: 202 mg/dL — ABNORMAL HIGH (ref 0–200)
HDL: 86 mg/dL (ref >=40)
LDL Cholesterol: 107 mg/dL — ABNORMAL HIGH (ref 0–99)
Total CHOL/HDL Ratio: 2.3 Ratio
Triglycerides: 46 mg/dL (ref <150)
VLDL: 9 mg/dL (ref 0–40)

## 2014-09-21 LAB — HEMOGLOBIN A1C
Hgb A1c MFr Bld: 6.4 % — ABNORMAL HIGH (ref <5.7)
Mean Plasma Glucose: 137 mg/dL — ABNORMAL HIGH (ref <117)

## 2014-09-27 ENCOUNTER — Encounter: Payer: Self-pay | Admitting: *Deleted

## 2014-09-29 ENCOUNTER — Ambulatory Visit (INDEPENDENT_AMBULATORY_CARE_PROVIDER_SITE_OTHER): Payer: Medicare HMO | Admitting: *Deleted

## 2014-09-29 DIAGNOSIS — I429 Cardiomyopathy, unspecified: Secondary | ICD-10-CM

## 2014-09-29 DIAGNOSIS — I420 Dilated cardiomyopathy: Secondary | ICD-10-CM

## 2014-09-29 LAB — MDC_IDC_ENUM_SESS_TYPE_INCLINIC
Date Time Interrogation Session: 20160127050000
HighPow Impedance: 67 Ohm
HighPow Impedance: 86 Ohm
Lead Channel Pacing Threshold Pulse Width: 0.4 ms
Lead Channel Setting Pacing Amplitude: 2.4 V
Lead Channel Setting Pacing Pulse Width: 0.4 ms
Lead Channel Setting Sensing Sensitivity: 0.4 mV
MDC IDC MSMT LEADCHNL RV IMPEDANCE VALUE: 561 Ohm
MDC IDC MSMT LEADCHNL RV PACING THRESHOLD AMPLITUDE: 0.7 V
MDC IDC MSMT LEADCHNL RV SENSING INTR AMPL: 18.6 mV
MDC IDC PG SERIAL: 118994
MDC IDC SET ZONE DETECTION INTERVAL: 316 ms
Zone Setting Detection Interval: 250 ms

## 2014-09-29 NOTE — Progress Notes (Signed)
Normal icd check No changes made Battery longevity 11 years. ROV in 3 mths w/device clinic and October with GT/RDS.

## 2014-10-05 ENCOUNTER — Other Ambulatory Visit: Payer: Self-pay | Admitting: Family Medicine

## 2014-10-05 NOTE — Telephone Encounter (Signed)
Refill appropriate and filled per protocol. 

## 2014-10-13 ENCOUNTER — Encounter: Payer: Self-pay | Admitting: Family Medicine

## 2014-10-13 ENCOUNTER — Encounter: Payer: Self-pay | Admitting: Internal Medicine

## 2014-10-15 ENCOUNTER — Telehealth: Payer: Self-pay | Admitting: Family Medicine

## 2014-10-15 MED ORDER — HYDROCODONE-ACETAMINOPHEN 7.5-325 MG PO TABS: 1.0000 | ORAL_TABLET | Freq: Two times a day (BID) | ORAL | Status: DC | PRN

## 2014-10-15 NOTE — Telephone Encounter (Signed)
[phone] PT is needing a refill on HYDROcodone-acetaminophen (NORCO) 7.5-325 MG per tablet

## 2014-10-15 NOTE — Telephone Encounter (Signed)
Okay to refill? 

## 2014-10-15 NOTE — Telephone Encounter (Signed)
Ok to refill??  Last office visit/ refill 09/15/2014.

## 2014-11-10 ENCOUNTER — Ambulatory Visit (INDEPENDENT_AMBULATORY_CARE_PROVIDER_SITE_OTHER): Payer: Medicare HMO | Admitting: Family Medicine

## 2014-11-10 ENCOUNTER — Encounter: Payer: Self-pay | Admitting: Family Medicine

## 2014-11-10 VITALS — BP 128/74 | HR 76 | Temp 98.3°F | Resp 14 | Ht 69.0 in | Wt 125.0 lb

## 2014-11-10 DIAGNOSIS — J301 Allergic rhinitis due to pollen: Secondary | ICD-10-CM | POA: Diagnosis not present

## 2014-11-10 MED ORDER — FLUTICASONE PROPIONATE 50 MCG/ACT NA SUSP: 2.0000 | Freq: Every day | NASAL | Status: DC

## 2014-11-10 MED ORDER — LEVOCETIRIZINE DIHYDROCHLORIDE 5 MG PO TABS: 5.0000 mg | ORAL_TABLET | Freq: Every evening | ORAL | Status: DC

## 2014-11-10 MED ORDER — HYDROCODONE-ACETAMINOPHEN 7.5-325 MG PO TABS: 1.0000 | ORAL_TABLET | Freq: Two times a day (BID) | ORAL | Status: DC | PRN

## 2014-11-10 NOTE — Progress Notes (Signed)
Patient ID: [name], male   DOB: 1971-04-09, 44 y.o.   MRN: [id number]   Subjective:    Patient ID: [name], male    DOB: 12-26-1970, 44 y.o.   MRN: [id number]  Patient presents for Sinus Infection  Pt here with nasal drainge, stuffiness, for past month, + sneezing, itchy eyes. Has tried OTC claritin and zyrtec with minimal improvement. He has not tried nasal steroids. No fever, feels well otherwise, symptoms only when outside. Also request refill on pain meds     Review Of Systems:  GEN- denies fatigue, fever, weight loss,weakness, recent illness HEENT- denies eye drainage, change in vision, +nasal discharge, CVS- denies chest pain, palpitations RESP- denies SOB, cough, wheeze ABD- denies N/V, change in stools, abd pain GU- denies dysuria, hematuria, dribbling, incontinence MSK- denies joint pain, muscle aches, injury Neuro- denies headache, dizziness, syncope, seizure activity       Objective:    BP 128/74 mmHg  Pulse 76  Temp(Src) 98.3 F (36.8 C) (Oral)  Resp 14  Ht 5\' 9"  (1.753 m)  Wt 125 lb (56.7 kg)  BMI 18.45 kg/m2 GEN- NAD, alert and oriented x3 HEENT- PERRL, EOMI, non injected sclera, pink conjunctiva, MMM, oropharynx clearTM clear bilat no effusion,  No  maxillary sinus tenderness, inflammed turbinates, + Nasal drainage  Neck- Supple, no LAD CVS- RRR, no murmur RESP-CTAB Pulses- Radial 2+          Assessment & Plan:      Problem List Items Addressed This Visit    None    Visit Diagnoses    Allergic rhinitis due to pollen    -  Primary    Trial of xyzal and flonase , pain meds refilled       Note: This dictation was prepared with Dragon dictation along with smaller phrase technology. Any transcriptional errors that result from this process are unintentional.

## 2014-11-10 NOTE — Patient Instructions (Signed)
Take the allergy pill Take the nasal spray  F/U as previous

## 2014-11-15 ENCOUNTER — Encounter: Payer: Self-pay | Admitting: Family Medicine

## 2014-12-09 ENCOUNTER — Telehealth: Payer: Self-pay | Admitting: *Deleted

## 2014-12-09 MED ORDER — HYDROCODONE-ACETAMINOPHEN 7.5-325 MG PO TABS: 1.0000 | ORAL_TABLET | Freq: Two times a day (BID) | ORAL | Status: DC | PRN

## 2014-12-09 NOTE — Telephone Encounter (Signed)
Noted prescription for Hydrocodone due on 12/10/2014.   Ok to refill??  Last office visit 11/10/2014.   If approved, triage please contact patient to pick up.

## 2014-12-09 NOTE — Telephone Encounter (Signed)
okay

## 2014-12-09 NOTE — Telephone Encounter (Signed)
Prescription printed and patient made aware to come to office to pick up on 12/10/2014 per VM.

## 2014-12-27 ENCOUNTER — Ambulatory Visit (INDEPENDENT_AMBULATORY_CARE_PROVIDER_SITE_OTHER): Payer: Medicare HMO | Admitting: *Deleted

## 2014-12-27 DIAGNOSIS — I429 Cardiomyopathy, unspecified: Secondary | ICD-10-CM | POA: Diagnosis not present

## 2014-12-27 DIAGNOSIS — I420 Dilated cardiomyopathy: Secondary | ICD-10-CM

## 2014-12-27 LAB — CUP PACEART INCLINIC DEVICE CHECK
Date Time Interrogation Session: 20160718141831
Lead Channel Sensing Intrinsic Amplitude: 18.2 mV
Lead Channel Setting Pacing Pulse Width: 0.4 ms
MDC IDC MSMT LEADCHNL RV PACING THRESHOLD AMPLITUDE: 0.4 V
MDC IDC MSMT LEADCHNL RV PACING THRESHOLD PULSEWIDTH: 0.4 ms
MDC IDC SET LEADCHNL RV PACING AMPLITUDE: 2.4 V
MDC IDC SET LEADCHNL RV SENSING SENSITIVITY: 0.4 mV
Pulse Gen Serial Number: 118994
Zone Setting Detection Interval: 250 ms
Zone Setting Detection Interval: 316 ms

## 2014-12-27 NOTE — Progress Notes (Signed)
ICD check in clinic. Battery longevity 10.5 years. Normal device function. 3 nst episodes. No changes made. ROV in October with GT/RDS.

## 2015-01-13 ENCOUNTER — Telehealth: Payer: Self-pay | Admitting: Family Medicine

## 2015-01-13 NOTE — Telephone Encounter (Signed)
Patient called requesting a prescription for his HYDROcodone-acetaminophen (NORCO) 7.5-325 MG per tablet

## 2015-01-13 NOTE — Telephone Encounter (Signed)
Ok to refill??  Last office visit 11/10/2014.  Last refill 12/09/2014.

## 2015-01-14 MED ORDER — HYDROCODONE-ACETAMINOPHEN 7.5-325 MG PO TABS: 1.0000 | ORAL_TABLET | Freq: Two times a day (BID) | ORAL | Status: DC | PRN

## 2015-01-14 NOTE — Telephone Encounter (Signed)
Okay to refill? 

## 2015-01-14 NOTE — Telephone Encounter (Signed)
Prescription printed and patient made aware to come to office to pick up on 01/14/2015 after 2pm per VM.

## 2015-01-19 ENCOUNTER — Encounter: Payer: Self-pay | Admitting: Internal Medicine

## 2015-02-13 ENCOUNTER — Other Ambulatory Visit: Payer: Self-pay | Admitting: Internal Medicine

## 2015-02-15 ENCOUNTER — Telehealth: Payer: Self-pay | Admitting: Family Medicine

## 2015-02-15 MED ORDER — HYDROCODONE-ACETAMINOPHEN 7.5-325 MG PO TABS: 1.0000 | ORAL_TABLET | Freq: Two times a day (BID) | ORAL | Status: DC | PRN

## 2015-02-15 NOTE — Telephone Encounter (Signed)
Okay to refill? 

## 2015-02-15 NOTE — Telephone Encounter (Signed)
Prescription printed and patient made aware to come to office to pick up after 2pm on 02/15/2015.

## 2015-02-15 NOTE — Telephone Encounter (Signed)
Patient requesting a prescription for his HYDROcodone-acetaminophen ([place]) 7.5-325 MG

## 2015-02-15 NOTE — Telephone Encounter (Signed)
Ok to refill??  Last office visit 11/10/2014.  Last refill 01/14/2015.

## 2015-02-23 ENCOUNTER — Other Ambulatory Visit (HOSPITAL_COMMUNITY): Payer: Self-pay | Admitting: *Deleted

## 2015-02-23 MED ORDER — LISINOPRIL 2.5 MG PO TABS: 2.5000 mg | ORAL_TABLET | Freq: Every day | ORAL | Status: DC

## 2015-03-11 ENCOUNTER — Encounter: Payer: Self-pay | Admitting: Internal Medicine

## 2015-03-11 ENCOUNTER — Ambulatory Visit (INDEPENDENT_AMBULATORY_CARE_PROVIDER_SITE_OTHER): Payer: Medicare HMO | Admitting: Internal Medicine

## 2015-03-11 VITALS — BP 134/88 | HR 89 | Ht 69.0 in | Wt 129.4 lb

## 2015-03-11 DIAGNOSIS — I429 Cardiomyopathy, unspecified: Secondary | ICD-10-CM | POA: Diagnosis not present

## 2015-03-11 DIAGNOSIS — I10 Essential (primary) hypertension: Secondary | ICD-10-CM

## 2015-03-11 DIAGNOSIS — I5023 Acute on chronic systolic (congestive) heart failure: Secondary | ICD-10-CM

## 2015-03-11 DIAGNOSIS — I420 Dilated cardiomyopathy: Secondary | ICD-10-CM

## 2015-03-11 DIAGNOSIS — Z95810 Presence of automatic (implantable) cardiac defibrillator: Secondary | ICD-10-CM | POA: Diagnosis not present

## 2015-03-11 NOTE — Progress Notes (Signed)
HPI [name] returns today for followup. He is a very pleasant 44 year old man with a nonischemic cardiomyopathy, chronic systolic heart failure, status post ICD implantation. The patient denies syncope, chest pain, or peripheral edema. Despite his multiple problems, he remains active. He has class 2A symptoms. He has been unable to take an ACE inhibitor or an ARB due to hypotension. No other complaints today. 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  Allergies  Allergen Reactions  . Ibuprofen Hives     Current Outpatient Prescriptions  Medication Sig Dispense Refill  . aspirin 81 MG tablet Take 81 mg by mouth daily.      . calcium-vitamin D (OSCAL WITH D) 500-200 MG-UNIT per tablet Take 2 tablets by mouth every morning.     . carvedilol (COREG) 12.5 MG tablet Take 12.5 mg by mouth 2 (two) times daily with a meal. Take 18.75 mg in am and 12.5 mg in pm    . digoxin (LANOXIN) 0.125 MG tablet TAKE ONE TABLET BY MOUTH ONCE DAILY 30 tablet 6  . FLUoxetine (PROZAC) 20 MG capsule TAKE ONE CAPSULE BY MOUTH IN THE MORNING 30 capsule 6  . fluticasone (FLONASE) 50 MCG/ACT nasal spray Place 2 sprays into both nostrils daily. 16 g 3  . furosemide (LASIX) 20 MG tablet TAKE ONE TABLET BY MOUTH DAILY 30 tablet 3  . HYDROcodone-acetaminophen (NORCO) 7.5-325 MG per tablet Take 1 tablet by mouth 2 (two) times daily as needed (pain). 60 tablet 0  . ivabradine HCl (CORLANOR) 5 MG TABS tablet Take 0.5 tablets (2.5 mg total) by mouth 2 (two) times daily with a meal. 60 tablet 2  . levocetirizine (XYZAL) 5 MG tablet Take 1 tablet (5 mg total) by mouth every evening. 30 tablet 6  . lisinopril (PRINIVIL,ZESTRIL) 2.5 MG tablet Take 1 tablet (2.5 mg total) by mouth daily. Needs office visit 90 tablet 3  . spironolactone (ALDACTONE) 25 MG tablet Take 1 tablet (25 mg total) by mouth daily. 90 tablet 3   No current facility-administered medications for this visit.     Past Medical History   Diagnosis Date  . Cardiomyopathy, nonischemic     takes Digoxin daily and Carvedilol daily  . Tobacco abuse   . Orthostatic hypotension   . Alcohol consumption heavy   . Scoliosis   . Wilm's tumor     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 74 (XRT and chemo)  . Hyperlipidemia     was on Simvastatin but has been off a year  . Chronic systolic heart failure   . Arthritis   . Chronic back pain   . Shortness of breath     lying flat and with exertion  . Low back pain     From Radiation damage was told by [facility] that he has multiple fx in his back  . GERD (gastroesophageal reflux disease)     takes Omeprazole daily  . CHF (congestive heart failure)     takes Lasix daily as well Aldactone  . Insomnia     takes Ambien as needed(just got script yesterday)  . Depression     takes Prozac daily    ROS:   All systems reviewed and negative except as noted in the HPI.   Past Surgical History  Procedure Laterality Date  . Nephrectomy  36 yrs ago    Wilms Tumor  . Icd  05/25/2011    [employer] Endotak [employer] SG lead/Energen single chamber device  . Carpal tunnel release Right  01/09/2013    Procedure: CARPAL TUNNEL RELEASE;  Surgeon: Winfield Cunas, MD;  Location: [place] [facility];  Service: Neurosurgery;  Laterality: Right;  RIGHT carpal tunnel release  . Ulnar nerve transposition Right 01/09/2013    Procedure: ULNAR NERVE DECOMPRESSION/TRANSPOSITION;  Surgeon: Winfield Cunas, MD;  Location: [facility];  Service: Neurosurgery;  Laterality: Right;  RIGHT ulnar nerve decompression  . Carpal tunnel release Left 01/30/2013    Procedure: LEFT CARPAL TUNNEL RELEASE;  Surgeon: Winfield Cunas, MD;  Location: [place] [facility];  Service: Neurosurgery;  Laterality: Left;  LEFT Carpal Tunnel release  . Chest tube insertion Right 09/09/2013    Procedure: INSERTION PLEURAL DRAINAGE CATHETER;  Surgeon: Ivin Poot, MD;  Location: [place];  Service: Thoracic;  Laterality: Right;  . Implantable cardioverter defibrillator  implant N/A 05/25/2011    Procedure: IMPLANTABLE CARDIOVERTER DEFIBRILLATOR IMPLANT;  Surgeon: Evans Lance, MD;  Location: [facility];  Service: Cardiovascular;  Laterality: N/A;     No family history on file.   Social History   Social History  . Marital Status: Legally Separated    Spouse Name: N/A  . Number of Children: 1  . Years of Education: N/A   Occupational History  . Full time     Engineer, civil (consulting)   Social History Main Topics  . Smoking status: Former Smoker -- 0.25 packs/day for 20 years    Types: Cigarettes    Quit date: 09/07/2013  . Smokeless tobacco: Current User    Types: Snuff, Chew     Comment: quit smoking the 1st of Jan 2015  . Alcohol Use: No  . Drug Use: No  . Sexual Activity: Not Currently   Other Topics Concern  . Not on file   Social History Narrative     BP 134/88 mmHg  Pulse 89  Ht 5\' 9"  (1.753 m)  Wt 129 lb 6.4 oz (58.695 kg)  BMI 19.10 kg/m2  SpO2 99%  Physical Exam:  Well appearing 44 year old man, NAD HEENT: Unremarkable Neck:  6 cm JVD, no thyromegally Back:  No CVA tenderness, marked kyphoscoliosis. Lungs:  Clear with no wheezes, rales, or rhonchi. Well-healed ICD incision. HEART:  Regular rate rhythm, no murmurs, no rubs, no clicks Abd:  soft, scaphoid, positive bowel sounds, no organomegally, no rebound, no guarding Ext:  2 plus pulses, no edema, no cyanosis, no clubbing Skin:  No rashes no nodules Neuro:  CN II through XII intact, motor grossly intact  DEVICE  Normal device function.  See PaceArt for details.   Assess/Plan:

## 2015-03-11 NOTE — Patient Instructions (Signed)
Your physician wants you to follow-up in: 12 Months with Dr. Lovena Le. You will receive a reminder letter in the mail two months in advance. If you don't receive a letter, please call our office to schedule the follow-up appointment.  Your physician recommends that you schedule a follow-up appointment in 3 Months in the [place] physician recommends that you continue on your current medications as directed. Please refer to the Current Medication list given to you today.  Thank you for choosing [facility]!

## 2015-03-11 NOTE — Assessment & Plan Note (Signed)
His Boston ICD is working normally. Will recheck in several months. 

## 2015-03-11 NOTE — Assessment & Plan Note (Signed)
His symptoms remain class 2. He will continue his current meds. Will follow.

## 2015-03-18 ENCOUNTER — Ambulatory Visit (INDEPENDENT_AMBULATORY_CARE_PROVIDER_SITE_OTHER): Payer: Medicare HMO | Admitting: Family Medicine

## 2015-03-18 ENCOUNTER — Encounter: Payer: Self-pay | Admitting: Family Medicine

## 2015-03-18 VITALS — BP 130/78 | HR 66 | Temp 98.0°F | Resp 14 | Ht 69.0 in | Wt 131.0 lb

## 2015-03-18 DIAGNOSIS — Z23 Encounter for immunization: Secondary | ICD-10-CM

## 2015-03-18 DIAGNOSIS — N5201 Erectile dysfunction due to arterial insufficiency: Secondary | ICD-10-CM | POA: Diagnosis not present

## 2015-03-18 DIAGNOSIS — N529 Male erectile dysfunction, unspecified: Secondary | ICD-10-CM | POA: Insufficient documentation

## 2015-03-18 DIAGNOSIS — G894 Chronic pain syndrome: Secondary | ICD-10-CM

## 2015-03-18 DIAGNOSIS — R7302 Impaired glucose tolerance (oral): Secondary | ICD-10-CM | POA: Diagnosis not present

## 2015-03-18 DIAGNOSIS — E785 Hyperlipidemia, unspecified: Secondary | ICD-10-CM | POA: Diagnosis not present

## 2015-03-18 MED ORDER — HYDROCODONE-ACETAMINOPHEN 7.5-325 MG PO TABS: 1.0000 | ORAL_TABLET | Freq: Two times a day (BID) | ORAL | Status: DC | PRN

## 2015-03-18 NOTE — Progress Notes (Signed)
Patient ID: [name], male   DOB: June 06, 1971, 44 y.o.   MRN: [id number]   Subjective:    Patient ID: [name], male    DOB: [birth year], 44 y.o.   MRN: [id number]  Patient presents for 4 month F/U  patient here to follow-up medications. He has no concerns today. He is taking all his medicines as prescribed. He does request a refill on his pain medication. He also needs a flu shot. The end of the visit he has to be to try something for erectile dysfunction due to his cardiovascular problems in his medications he has difficulty with erections. He is not on any nitrates    Review Of Systems:  GEN- denies fatigue, fever, weight loss,weakness, recent illness HEENT- denies eye drainage, change in vision, nasal discharge, CVS- denies chest pain, palpitations RESP- denies SOB, cough, wheeze ABD- denies N/V, change in stools, abd pain GU- denies dysuria, hematuria, dribbling, incontinence MSK- +joint pain, muscle aches, injury Neuro- denies headache, dizziness, syncope, seizure activity       Objective:    BP 130/78 mmHg  Pulse 66  Temp(Src) 98 F (36.7 C) (Oral)  Resp 14  Ht 5\' 9"  (1.753 m)  Wt 131 lb (59.421 kg)  BMI 19.34 kg/m2 GEN- NAD, alert and oriented x3 HEENT- PERRL, EOMI, non injected sclera, pink conjunctiva, MMM, oropharynx clear Neck- Supple, no thyromegaly CVS- RRR, no murmur RESP-CTAB EXT- No edema Pulses- Radial, DP- 2+        Assessment & Plan:      Problem List Items Addressed This Visit    None      Note: This dictation was prepared with Dragon dictation along with smaller phrase technology. Any transcriptional errors that result from this process are unintentional.

## 2015-03-18 NOTE — Patient Instructions (Signed)
Continue current meds Get labs done fasting Flu shot given F/U 6 months

## 2015-03-20 ENCOUNTER — Encounter: Payer: Self-pay | Admitting: Family Medicine

## 2015-03-20 NOTE — Assessment & Plan Note (Signed)
Recheck A1C 

## 2015-03-20 NOTE — Assessment & Plan Note (Signed)
No current treatment, recheck labs

## 2015-03-20 NOTE — Assessment & Plan Note (Signed)
Chronic back, neck pain, h/o compression fractures, meds refilled

## 2015-03-20 NOTE — Assessment & Plan Note (Signed)
Viagra samples 100mg  given, take 1/2 to 1 tab prn

## 2015-03-25 LAB — CUP PACEART INCLINIC DEVICE CHECK
Brady Statistic RV Percent Paced: 1 % — CL
HIGH POWER IMPEDANCE MEASURED VALUE: 83 Ohm
HighPow Impedance: 67 Ohm
Implantable Lead Serial Number: 307189
Lead Channel Impedance Value: 539 Ohm
Lead Channel Pacing Threshold Amplitude: 0.4 V
Lead Channel Sensing Intrinsic Amplitude: 16.9 mV
Lead Channel Setting Pacing Amplitude: 2.4 V
Lead Channel Setting Pacing Pulse Width: 0.4 ms
MDC IDC LEAD IMPLANT DT: 20121214
MDC IDC LEAD LOCATION: 753860
MDC IDC LEAD MODEL: 180
MDC IDC MSMT LEADCHNL RV PACING THRESHOLD PULSEWIDTH: 0.4 ms
MDC IDC SESS DTM: 20160930040000
MDC IDC SET LEADCHNL RV SENSING SENSITIVITY: 0.4 mV
MDC IDC SET ZONE DETECTION INTERVAL: 316 ms
MDC IDC SET ZONE VENDOR TYPE: 771137
MDC IDC SET ZONE VENDOR TYPE: 771138
MDC IDC SET ZONE VENDOR TYPE: 771139
Pulse Gen Serial Number: 118994
Zone Setting Detection Interval: 250 ms

## 2015-04-08 ENCOUNTER — Other Ambulatory Visit (HOSPITAL_COMMUNITY): Payer: Self-pay | Admitting: Internal Medicine

## 2015-04-18 ENCOUNTER — Telehealth: Payer: Self-pay | Admitting: Family Medicine

## 2015-04-18 MED ORDER — HYDROCODONE-ACETAMINOPHEN 7.5-325 MG PO TABS: 1.0000 | ORAL_TABLET | Freq: Two times a day (BID) | ORAL | Status: DC | PRN

## 2015-04-18 NOTE — Telephone Encounter (Signed)
Okay to refill? 

## 2015-04-18 NOTE — Telephone Encounter (Signed)
Ok to refill??  Last office visit/ refill 03/18/2015.

## 2015-04-18 NOTE — Telephone Encounter (Signed)
Patient would like rx for his norco  [phone]

## 2015-04-18 NOTE — Telephone Encounter (Signed)
Prescription printed and patient made aware to come to office to pick up after 2pm on 04/18/2015.

## 2015-05-18 ENCOUNTER — Telehealth: Payer: Self-pay | Admitting: Family Medicine

## 2015-05-18 MED ORDER — HYDROCODONE-ACETAMINOPHEN 7.5-325 MG PO TABS: 1.0000 | ORAL_TABLET | Freq: Two times a day (BID) | ORAL | Status: DC | PRN

## 2015-05-18 NOTE — Telephone Encounter (Signed)
Ok to refill??  Last office visit 03/18/2015.  Last refill 04/18/2015.

## 2015-05-18 NOTE — Telephone Encounter (Signed)
Prescription printed and patient made aware to come to office to pick up after 2 pm on 05/18/2015.

## 2015-05-18 NOTE — Telephone Encounter (Signed)
Patient is calling to get norco rx  [phone]

## 2015-05-18 NOTE — Telephone Encounter (Signed)
okay

## 2015-05-23 ENCOUNTER — Other Ambulatory Visit (HOSPITAL_COMMUNITY): Payer: Self-pay | Admitting: Internal Medicine

## 2015-06-20 ENCOUNTER — Ambulatory Visit (INDEPENDENT_AMBULATORY_CARE_PROVIDER_SITE_OTHER): Payer: Medicare HMO | Admitting: Family Medicine

## 2015-06-20 ENCOUNTER — Encounter: Payer: Self-pay | Admitting: Family Medicine

## 2015-06-20 VITALS — BP 130/72 | HR 88 | Temp 98.4°F | Resp 14 | Ht 69.0 in | Wt 134.0 lb

## 2015-06-20 DIAGNOSIS — R7302 Impaired glucose tolerance (oral): Secondary | ICD-10-CM

## 2015-06-20 DIAGNOSIS — J0100 Acute maxillary sinusitis, unspecified: Secondary | ICD-10-CM

## 2015-06-20 DIAGNOSIS — G894 Chronic pain syndrome: Secondary | ICD-10-CM | POA: Diagnosis not present

## 2015-06-20 DIAGNOSIS — E785 Hyperlipidemia, unspecified: Secondary | ICD-10-CM

## 2015-06-20 MED ORDER — AMOXICILLIN 875 MG PO TABS: 875.0000 mg | ORAL_TABLET | Freq: Two times a day (BID) | ORAL | Status: DC

## 2015-06-20 MED ORDER — HYDROCODONE-ACETAMINOPHEN 7.5-325 MG PO TABS: 1.0000 | ORAL_TABLET | Freq: Two times a day (BID) | ORAL | Status: DC | PRN

## 2015-06-20 MED ORDER — TRIAMCINOLONE ACETONIDE 55 MCG/ACT NA AERO: 2.0000 | INHALATION_SPRAY | Freq: Every day | NASAL | Status: DC

## 2015-06-20 NOTE — Progress Notes (Signed)
Patient ID: [name], male   DOB: 1971/05/17, 45 y.o.   MRN: [id number]   Subjective:    Patient ID: [name], male    DOB: Mar 06, 1971, 45 y.o.   MRN: [id number]  Patient presents for 3 month F/u  Pt here for F/U, due for fasting labs for glucose intolerance and hyperlipidemia.   Followed by cardiology due to device check- defribrillator no chest pain no SOB, taking cardiomyopathy meds as prescribed no SE   Sinus drainage and pressure for past few weeks, using flonase, xyzal not improving. No fever. Worst during the day, mild cough, but this has improved. Has thick nasal discharge   Meds reviewed, request refill on pain meds- he has been compliant with use of medication   Flu shot UTD     Review Of Systems:  GEN- denies fatigue, fever, weight loss,weakness, recent illness HEENT- denies eye drainage, change in vision, +nasal discharge, CVS- denies chest pain, palpitations RESP- denies SOB, +cough, wheeze ABD- denies N/V, change in stools, abd pain GU- denies dysuria, hematuria, dribbling, incontinence MSK-+ joint pain, muscle aches, injury Neuro- denies headache, dizziness, syncope, seizure activity       Objective:    BP 130/72 mmHg  Pulse 88  Temp(Src) 98.4 F (36.9 C) (Oral)  Resp 14  Ht 5\' 9"  (1.753 m)  Wt 134 lb (60.782 kg)  BMI 19.78 kg/m2 GEN- NAD, alert and oriented x3 HEENT- PERRL, EOMI, non injected sclera, pink conjunctiva, MMM, oropharynx clear TM clear bilat no effusion,  + maxillary sinus tenderness, inflammed turbinates,  Nasal drainage  Neck- Supple, no LAD CVS- RRR, no murmur RESP-CTAB EXT- No edema Pulses- Radial 2+         Assessment & Plan:      Problem List Items Addressed This Visit    Hyperlipidemia - Primary   Glucose intolerance (impaired glucose tolerance)    Check fasting labs, lipids, metabolic panel He will schedule with cardiology for device check      Chronic pain syndrome    Pain meds refilled,       Other  Visit Diagnoses    Acute maxillary sinusitis, recurrence not specified        Amox given, change to Nasacort, continue anti-histamine     Relevant Medications    amoxicillin (AMOXIL) 875 MG tablet    triamcinolone (NASACORT AQ) 55 MCG/ACT AERO nasal inhaler       Note: This dictation was prepared with Dragon dictation along with smaller phrase technology. Any transcriptional errors that result from this process are unintentional.

## 2015-06-20 NOTE — Patient Instructions (Signed)
Take antibiotics  We will call with lab results F/U 4 months  

## 2015-06-20 NOTE — Assessment & Plan Note (Signed)
Check fasting labs, lipids, metabolic panel He will schedule with cardiology for device check

## 2015-06-20 NOTE — Assessment & Plan Note (Signed)
Pain meds refilled,

## 2015-06-21 ENCOUNTER — Other Ambulatory Visit: Payer: Self-pay | Admitting: *Deleted

## 2015-06-21 DIAGNOSIS — E871 Hypo-osmolality and hyponatremia: Secondary | ICD-10-CM

## 2015-06-21 DIAGNOSIS — R7989 Other specified abnormal findings of blood chemistry: Secondary | ICD-10-CM

## 2015-06-21 DIAGNOSIS — F1011 Alcohol abuse, in remission: Secondary | ICD-10-CM

## 2015-06-21 DIAGNOSIS — R945 Abnormal results of liver function studies: Secondary | ICD-10-CM

## 2015-06-21 LAB — COMPREHENSIVE METABOLIC PANEL
ALT: 39 U/L (ref 9–46)
AST: 67 U/L — ABNORMAL HIGH (ref 10–40)
Albumin: 4.1 g/dL (ref 3.6–5.1)
Alkaline Phosphatase: 98 U/L (ref 40–115)
BILIRUBIN TOTAL: 1 mg/dL (ref 0.2–1.2)
BUN: 12 mg/dL (ref 7–25)
CALCIUM: 9 mg/dL (ref 8.6–10.3)
CO2: 29 mmol/L (ref 20–31)
CREATININE: 0.87 mg/dL (ref 0.60–1.35)
Chloride: 91 mmol/L — ABNORMAL LOW (ref 98–110)
GLUCOSE: 127 mg/dL — AB (ref 70–99)
Potassium: 5.1 mmol/L (ref 3.5–5.3)
SODIUM: 127 mmol/L — AB (ref 135–146)
Total Protein: 7 g/dL (ref 6.1–8.1)

## 2015-06-21 LAB — CBC WITH DIFFERENTIAL/PLATELET
BASOS PCT: 1 % (ref 0–1)
Basophils Absolute: 0 10*3/uL (ref 0.0–0.1)
EOS ABS: 0 10*3/uL (ref 0.0–0.7)
EOS PCT: 1 % (ref 0–5)
HCT: 41.4 % (ref 39.0–52.0)
Hemoglobin: 14.6 g/dL (ref 13.0–17.0)
Lymphocytes Relative: 22 % (ref 12–46)
Lymphs Abs: 1 10*3/uL (ref 0.7–4.0)
MCH: 33.5 pg (ref 26.0–34.0)
MCHC: 35.3 g/dL (ref 30.0–36.0)
MCV: 95 fL (ref 78.0–100.0)
MONO ABS: 0.7 10*3/uL (ref 0.1–1.0)
MPV: 9.8 fL (ref 8.6–12.4)
Monocytes Relative: 15 % — ABNORMAL HIGH (ref 3–12)
Neutro Abs: 2.7 10*3/uL (ref 1.7–7.7)
Neutrophils Relative %: 61 % (ref 43–77)
Platelets: 227 10*3/uL (ref 150–400)
RBC: 4.36 MIL/uL (ref 4.22–5.81)
RDW: 14.5 % (ref 11.5–15.5)
WBC: 4.4 10*3/uL (ref 4.0–10.5)

## 2015-06-21 LAB — LIPID PANEL
CHOLESTEROL: 188 mg/dL (ref 125–200)
HDL: 69 mg/dL (ref >=40)
LDL CALC: 104 mg/dL (ref <130)
Total CHOL/HDL Ratio: 2.7 Ratio (ref <=5.0)
Triglycerides: 74 mg/dL (ref <150)
VLDL: 15 mg/dL (ref <30)

## 2015-06-21 LAB — HEMOGLOBIN A1C
Hgb A1c MFr Bld: 6.3 % — ABNORMAL HIGH (ref <5.7)
MEAN PLASMA GLUCOSE: 134 mg/dL — AB (ref <117)

## 2015-06-22 ENCOUNTER — Telehealth: Payer: Self-pay | Admitting: *Deleted

## 2015-06-22 NOTE — Telephone Encounter (Signed)
Pt has appt scheduled for Tues Jan 17 at 9:15am with arrival of 9:00am at [facility] radiology, is to not to eat or drink after midnigth night before. Lmtrc to pt for appt informaiton

## 2015-06-23 ENCOUNTER — Other Ambulatory Visit: Payer: Medicare HMO

## 2015-06-23 DIAGNOSIS — E871 Hypo-osmolality and hyponatremia: Secondary | ICD-10-CM | POA: Diagnosis not present

## 2015-06-23 NOTE — Telephone Encounter (Signed)
Pt aware of appt.

## 2015-06-24 LAB — COMPREHENSIVE METABOLIC PANEL
ALK PHOS: 101 U/L (ref 40–115)
ALT: 36 U/L (ref 9–46)
AST: 44 U/L — ABNORMAL HIGH (ref 10–40)
Albumin: 4.1 g/dL (ref 3.6–5.1)
BUN: 7 mg/dL (ref 7–25)
CALCIUM: 9.7 mg/dL (ref 8.6–10.3)
CO2: 30 mmol/L (ref 20–31)
Chloride: 97 mmol/L — ABNORMAL LOW (ref 98–110)
Creat: 0.98 mg/dL (ref 0.60–1.35)
GLUCOSE: 122 mg/dL — AB (ref 70–99)
POTASSIUM: 4.5 mmol/L (ref 3.5–5.3)
Sodium: 139 mmol/L (ref 135–146)
TOTAL PROTEIN: 7.2 g/dL (ref 6.1–8.1)
Total Bilirubin: 1.2 mg/dL (ref 0.2–1.2)

## 2015-06-28 ENCOUNTER — Ambulatory Visit (HOSPITAL_COMMUNITY): Admission: RE | Admit: 2015-06-28 | Payer: Medicare HMO | Source: Ambulatory Visit

## 2015-06-30 ENCOUNTER — Ambulatory Visit (HOSPITAL_COMMUNITY)
Admission: RE | Admit: 2015-06-30 | Discharge: 2015-06-30 | Disposition: A | Discharge status: Home / Self Care | Payer: Medicare HMO | Source: Ambulatory Visit | Attending: Family Medicine | Admitting: Family Medicine

## 2015-06-30 DIAGNOSIS — F1010 Alcohol abuse, uncomplicated: Secondary | ICD-10-CM | POA: Diagnosis present

## 2015-06-30 DIAGNOSIS — F1011 Alcohol abuse, in remission: Secondary | ICD-10-CM

## 2015-06-30 DIAGNOSIS — R945 Abnormal results of liver function studies: Secondary | ICD-10-CM

## 2015-06-30 DIAGNOSIS — E871 Hypo-osmolality and hyponatremia: Secondary | ICD-10-CM | POA: Diagnosis not present

## 2015-06-30 DIAGNOSIS — R7989 Other specified abnormal findings of blood chemistry: Secondary | ICD-10-CM | POA: Diagnosis not present

## 2015-06-30 DIAGNOSIS — R69 Illness, unspecified: Secondary | ICD-10-CM | POA: Diagnosis not present

## 2015-07-01 ENCOUNTER — Other Ambulatory Visit: Payer: Self-pay | Admitting: *Deleted

## 2015-07-01 DIAGNOSIS — R7989 Other specified abnormal findings of blood chemistry: Secondary | ICD-10-CM

## 2015-07-01 DIAGNOSIS — R945 Abnormal results of liver function studies: Principal | ICD-10-CM

## 2015-07-08 ENCOUNTER — Encounter: Payer: Self-pay | Admitting: Internal Medicine

## 2015-07-08 ENCOUNTER — Ambulatory Visit (INDEPENDENT_AMBULATORY_CARE_PROVIDER_SITE_OTHER): Payer: Medicare HMO | Admitting: *Deleted

## 2015-07-08 DIAGNOSIS — I420 Dilated cardiomyopathy: Secondary | ICD-10-CM

## 2015-07-08 DIAGNOSIS — I429 Cardiomyopathy, unspecified: Secondary | ICD-10-CM

## 2015-07-08 LAB — CUP PACEART INCLINIC DEVICE CHECK
Battery Remaining Longevity: 120 mo
Date Time Interrogation Session: 20170127134418
HighPow Impedance: 84 Ohm
Implantable Lead Implant Date: 20121214
Implantable Lead Location: 753860
Implantable Lead Model: 180
Implantable Lead Serial Number: 307189
Lead Channel Impedance Value: 538 Ohm
Lead Channel Pacing Threshold Amplitude: 0.7 V
Lead Channel Pacing Threshold Pulse Width: 0.4 ms
Lead Channel Sensing Intrinsic Amplitude: 16.3 mV
Lead Channel Setting Pacing Amplitude: 2.4 V
Lead Channel Setting Pacing Pulse Width: 0.4 ms
Lead Channel Setting Sensing Sensitivity: 0.4 mV
Pulse Gen Serial Number: 118994

## 2015-07-08 NOTE — Progress Notes (Signed)
ICD check in clinic. Normal device function. Threshold and sensing consistent with previous device measurements. Impedance trends stable over time. (3) ventricular arrhythmias---brief NSVT per EGMs. Histogram distribution appropriate for patient and level of activity. No changes made this session. Device programmed at appropriate safety margins. Device programmed to optimize intrinsic conduction. Estimated longevity 10 years. Pt will follow up with the Device Clinic/R in 3 months and with GT/R in 03-2016

## 2015-07-12 ENCOUNTER — Ambulatory Visit (INDEPENDENT_AMBULATORY_CARE_PROVIDER_SITE_OTHER): Payer: Medicare HMO | Admitting: Family Medicine

## 2015-07-12 ENCOUNTER — Encounter: Payer: Self-pay | Admitting: Family Medicine

## 2015-07-12 VITALS — BP 134/74 | HR 80 | Temp 98.6°F | Resp 16 | Ht 69.0 in | Wt 134.0 lb

## 2015-07-12 DIAGNOSIS — R7989 Other specified abnormal findings of blood chemistry: Secondary | ICD-10-CM | POA: Diagnosis not present

## 2015-07-12 DIAGNOSIS — R945 Abnormal results of liver function studies: Secondary | ICD-10-CM | POA: Diagnosis not present

## 2015-07-12 DIAGNOSIS — R799 Abnormal finding of blood chemistry, unspecified: Secondary | ICD-10-CM | POA: Diagnosis not present

## 2015-07-12 DIAGNOSIS — K7460 Unspecified cirrhosis of liver: Secondary | ICD-10-CM | POA: Diagnosis not present

## 2015-07-12 MED ORDER — OXYCODONE HCL 5 MG PO TABS
5.0000 mg | ORAL_TABLET | Freq: Three times a day (TID) | ORAL | Status: DC | PRN
Start: 2015-07-12 — End: 2015-08-10

## 2015-07-12 NOTE — Patient Instructions (Signed)
Referral to Gastroenterology New pain medication oxycodone Check Hepatitis labs  F/U as previous

## 2015-07-12 NOTE — Progress Notes (Signed)
Patient ID: [name], male   DOB: [birth year], 45 y.o.   MRN: [id number]   Subjective:    Patient ID: [name], male    DOB: 1970/07/10, 45 y.o.   MRN: [id number]  Patient presents for Medication Management and Labs patient here to follow-up abnormal labs. He was found to have some elevated liver enzymes. He had isolated elevated ALT in the past his AST returned at 67 he also had hyponatremia this was repeated and the hyponatremia resolved he also had a decrease in his AST to 44. He does have history of heavy alcohol use which contributed to his cardiomyopathy. Ultrasound was obtained which showed increased liver nodularity as well as some changes of fatty liver disease. He is due today to have a hepatitis panel done. He is on chronic hydrocodone with acetaminophen and I want to decrease the amount of Tylenol he is taking. He feels well has no concerns today.    Review Of Systems:  GEN- denies fatigue, fever, weight loss,weakness, recent illness HEENT- denies eye drainage, change in vision, nasal discharge, CVS- denies chest pain, palpitations RESP- denies SOB, cough, wheeze ABD- denies N/V, change in stools, abd pain GU- denies dysuria, hematuria, dribbling, incontinence MSK- denies joint pain, muscle aches, injury Neuro- denies headache, dizziness, syncope, seizure activity       Objective:    BP 134/74 mmHg  Pulse 80  Temp(Src) 98.6 F (37 C) (Oral)  Resp 16  Ht 5\' 9"  (1.753 m)  Wt 134 lb (60.782 kg)  BMI 19.78 kg/m2 GEN- NAD, alert and oriented x3         Assessment & Plan:      Problem List Items Addressed This Visit    None    Visit Diagnoses    Cirrhosis of liver without ascites, unspecified hepatic cirrhosis type ([place])    -  Primary    LFT improved, off Tylenol for past week. Will change to oxycodone 5mg , Hep panel today, with ETOH history concern he may have underlying cirrhosis, send to GI     Elevated LFTs           Note: This dictation was  prepared with Dragon dictation along with smaller phrase technology. Any transcriptional errors that result from this process are unintentional.

## 2015-07-13 LAB — HEPATITIS PANEL, ACUTE
HCV AB: NEGATIVE
HEP A IGM: NONREACTIVE
HEP B C IGM: NONREACTIVE
Hepatitis B Surface Ag: NEGATIVE

## 2015-07-14 ENCOUNTER — Encounter (INDEPENDENT_AMBULATORY_CARE_PROVIDER_SITE_OTHER): Payer: Self-pay | Admitting: *Deleted

## 2015-07-14 ENCOUNTER — Other Ambulatory Visit (HOSPITAL_COMMUNITY): Payer: Self-pay | Admitting: Internal Medicine

## 2015-07-14 ENCOUNTER — Other Ambulatory Visit: Payer: Self-pay | Admitting: Family Medicine

## 2015-07-14 NOTE — Telephone Encounter (Signed)
Medication refilled per protocol. 

## 2015-08-04 ENCOUNTER — Ambulatory Visit (INDEPENDENT_AMBULATORY_CARE_PROVIDER_SITE_OTHER): Payer: Medicare HMO | Admitting: Internal Medicine

## 2015-08-10 ENCOUNTER — Telehealth: Payer: Self-pay | Admitting: Family Medicine

## 2015-08-10 MED ORDER — OXYCODONE HCL 5 MG PO TABS: 5.0000 mg | ORAL_TABLET | Freq: Three times a day (TID) | ORAL | Status: DC | PRN

## 2015-08-10 NOTE — Telephone Encounter (Signed)
Prescription printed and patient made aware to come to office to pick up after 2 pm on 08/10/2015 via VM.

## 2015-08-10 NOTE — Telephone Encounter (Signed)
Patient calling to get rx for hydrocodone  [phone]

## 2015-08-10 NOTE — Telephone Encounter (Signed)
Ok to refill??  Last office visit/ refill 07/12/2015.

## 2015-08-10 NOTE — Telephone Encounter (Signed)
Refill oxy 5mg  IR

## 2015-08-29 ENCOUNTER — Other Ambulatory Visit: Payer: Self-pay | Admitting: Family Medicine

## 2015-08-29 ENCOUNTER — Other Ambulatory Visit: Payer: Self-pay | Admitting: Internal Medicine

## 2015-08-29 ENCOUNTER — Other Ambulatory Visit (HOSPITAL_COMMUNITY): Payer: Self-pay | Admitting: Adult Health

## 2015-08-29 NOTE — Telephone Encounter (Signed)
Refill appropriate and filled per protocol. 

## 2015-09-01 ENCOUNTER — Other Ambulatory Visit (HOSPITAL_COMMUNITY): Payer: Self-pay | Admitting: Adult Health

## 2015-09-01 ENCOUNTER — Ambulatory Visit (INDEPENDENT_AMBULATORY_CARE_PROVIDER_SITE_OTHER): Payer: Medicare HMO | Admitting: Internal Medicine

## 2015-09-09 ENCOUNTER — Telehealth: Payer: Self-pay | Admitting: Family Medicine

## 2015-09-09 MED ORDER — OXYCODONE HCL 5 MG PO TABS: 5.0000 mg | ORAL_TABLET | Freq: Three times a day (TID) | ORAL | Status: DC | PRN

## 2015-09-09 NOTE — Telephone Encounter (Signed)
Patient is asking for rx for oxycodone  [phone]

## 2015-09-09 NOTE — Telephone Encounter (Signed)
Ok to refill??  Last office visit 07/12/2015.  Last refill 08/10/2015.

## 2015-09-09 NOTE — Telephone Encounter (Signed)
okay

## 2015-09-09 NOTE — Telephone Encounter (Signed)
Prescription printed and patient made aware to come to office to pick up after 4 pm on 09/09/2015.

## 2015-09-19 ENCOUNTER — Ambulatory Visit: Payer: Medicare HMO | Admitting: Family Medicine

## 2015-09-21 ENCOUNTER — Ambulatory Visit (INDEPENDENT_AMBULATORY_CARE_PROVIDER_SITE_OTHER): Payer: Medicare HMO | Admitting: Family Medicine

## 2015-09-21 ENCOUNTER — Encounter: Payer: Self-pay | Admitting: Family Medicine

## 2015-09-21 VITALS — BP 122/68 | HR 70 | Temp 98.5°F | Resp 14 | Ht 69.0 in | Wt 123.0 lb

## 2015-09-21 DIAGNOSIS — E785 Hyperlipidemia, unspecified: Secondary | ICD-10-CM | POA: Diagnosis not present

## 2015-09-21 DIAGNOSIS — F329 Major depressive disorder, single episode, unspecified: Secondary | ICD-10-CM

## 2015-09-21 DIAGNOSIS — F32A Depression, unspecified: Secondary | ICD-10-CM

## 2015-09-21 DIAGNOSIS — R69 Illness, unspecified: Secondary | ICD-10-CM | POA: Diagnosis not present

## 2015-09-21 DIAGNOSIS — K7460 Unspecified cirrhosis of liver: Secondary | ICD-10-CM | POA: Diagnosis not present

## 2015-09-21 DIAGNOSIS — I420 Dilated cardiomyopathy: Secondary | ICD-10-CM

## 2015-09-21 DIAGNOSIS — G894 Chronic pain syndrome: Secondary | ICD-10-CM | POA: Diagnosis not present

## 2015-09-21 DIAGNOSIS — I429 Cardiomyopathy, unspecified: Secondary | ICD-10-CM | POA: Diagnosis not present

## 2015-09-21 LAB — CBC WITH DIFFERENTIAL/PLATELET
BASOS PCT: 1 %
Basophils Absolute: 29 cells/uL (ref 0–200)
EOS PCT: 2 %
Eosinophils Absolute: 58 cells/uL (ref 15–500)
HCT: 44.6 % (ref 38.5–50.0)
Hemoglobin: 14.7 g/dL (ref 13.0–17.0)
LYMPHS PCT: 30 %
Lymphs Abs: 870 cells/uL (ref 850–3900)
MCH: 31.5 pg (ref 27.0–33.0)
MCHC: 33 g/dL (ref 32.0–36.0)
MCV: 95.7 fL (ref 80.0–100.0)
MONOS PCT: 13 %
MPV: 9.7 fL (ref 7.5–12.5)
Monocytes Absolute: 377 cells/uL (ref 200–950)
NEUTROS ABS: 1566 {cells}/uL (ref 1500–7800)
Neutrophils Relative %: 54 %
PLATELETS: 223 10*3/uL (ref 140–400)
RBC: 4.66 MIL/uL (ref 4.20–5.80)
RDW: 15 % (ref 11.0–15.0)
WBC: 2.9 10*3/uL — AB (ref 3.8–10.8)

## 2015-09-21 LAB — COMPREHENSIVE METABOLIC PANEL
ALK PHOS: 76 U/L (ref 40–115)
ALT: 30 U/L (ref 9–46)
AST: 39 U/L (ref 10–40)
Albumin: 4.3 g/dL (ref 3.6–5.1)
BUN: 7 mg/dL (ref 7–25)
CO2: 30 mmol/L (ref 20–31)
Calcium: 9.5 mg/dL (ref 8.6–10.3)
Chloride: 97 mmol/L — ABNORMAL LOW (ref 98–110)
Creat: 0.78 mg/dL (ref 0.60–1.35)
GLUCOSE: 121 mg/dL — AB (ref 70–99)
POTASSIUM: 4.8 mmol/L (ref 3.5–5.3)
Sodium: 136 mmol/L (ref 135–146)
Total Bilirubin: 1 mg/dL (ref 0.2–1.2)
Total Protein: 7.2 g/dL (ref 6.1–8.1)

## 2015-09-21 MED ORDER — OXYCODONE HCL 5 MG PO TABS: 5.0000 mg | ORAL_TABLET | Freq: Three times a day (TID) | ORAL | Status: DC | PRN

## 2015-09-21 NOTE — Assessment & Plan Note (Signed)
He is currently compensated with regards to his heart failure. He is also following up for his ICD checks on a regular basis. His blood pressure is well controlled.

## 2015-09-21 NOTE — Assessment & Plan Note (Signed)
His review of heavy alcohol abuse which led to his cardiomyopathy and his heart failure. Ultrasound concerning for cirrhosis also with chronically elevated liver test though not very high. I will continue to monitor I'll also add on an AFP to his labs.

## 2015-09-21 NOTE — Progress Notes (Signed)
Patient ID: [name], male   DOB: December 06, 1970, 45 y.o.   MRN: [id number]    Subjective:    Patient ID: [name], male    DOB: [birth year], 45 y.o.   MRN: [id number]  Patient presents for 3 month F/U Patient here for follow-up on chronic medical problems. He is no specific concerns today. His last set of labs were concerning for alcoholic cirrhosis of the liver. He also had ultrasound which showed cirrhosis appearance. He did not want to go to the gastroenterologist after family friend had been diagnosed with cancer he did not one a bad news regarding the diagnosis therefore did not go in. He is willing to have his labs monitored and if anything changes or he starts to have symptoms then he will see them before now he does not want to see any specialists regarding his liver. He is avoiding alcohol and other medications that may interfere with his liver function.  He still being followed by cardiology    Review Of Systems:  GEN- denies fatigue, fever, weight loss,weakness, recent illness HEENT- denies eye drainage, change in vision, nasal discharge, CVS- denies chest pain, palpitations RESP- denies SOB, cough, wheeze ABD- denies N/V, change in stools, abd pain GU- denies dysuria, hematuria, dribbling, incontinence MSK- denies joint pain, muscle aches, injury Neuro- denies headache, dizziness, syncope, seizure activity       Objective:    BP 122/68 mmHg  Pulse 70  Temp(Src) 98.5 F (36.9 C) (Oral)  Resp 14  Ht 5\' 9"  (1.753 m)  Wt 123 lb (55.792 kg)  BMI 18.16 kg/m2 GEN- NAD, alert and oriented x3 HEENT- PERRL, EOMI, non injected sclera, pink conjunctiva, MMM, oropharynx clear, no jaundice CVS- RRR, no murmur RESP-CTAB ABD-NABS,soft,NT,ND, no HSM  Psych- normal affect and mood  EXT- No edema Pulses- Radial - 2+        Assessment & Plan:      Problem List Items Addressed This Visit    Nonischemic dilated cardiomyopathy (HCC) (Chronic)    He is currently  compensated with regards to his heart failure. He is also following up for his ICD checks on a regular basis. His blood pressure is well controlled.      Hyperlipidemia    Cholesterol is at goal      Depression    His mood is stable with Prozac will continue current dose      Cirrhosis of liver ([place])    His review of heavy alcohol abuse which led to his cardiomyopathy and his heart failure. Ultrasound concerning for cirrhosis also with chronically elevated liver test though not very high. I will continue to monitor I'll also add on an AFP to his labs.      Relevant Orders   Comprehensive metabolic panel   CBC with Differential/Platelet   AFP tumor marker   Chronic pain syndrome - Primary    Continue oxycodone as prescribed         Note: This dictation was prepared with Dragon dictation along with smaller phrase technology. Any transcriptional errors that result from this process are unintentional.

## 2015-09-21 NOTE — Patient Instructions (Signed)
Continue current medications We will call with lab results  F/U 4 months  

## 2015-09-21 NOTE — Assessment & Plan Note (Signed)
Continue oxycodone as prescribed  

## 2015-09-21 NOTE — Assessment & Plan Note (Signed)
His mood is stable with Prozac will continue current dose

## 2015-09-21 NOTE — Assessment & Plan Note (Signed)
Cholesterol is at goal

## 2015-09-22 LAB — AFP TUMOR MARKER: AFP TUMOR MARKER: 8.3 ng/mL — AB (ref <6.1)

## 2015-09-23 ENCOUNTER — Other Ambulatory Visit: Payer: Self-pay | Admitting: *Deleted

## 2015-09-26 ENCOUNTER — Encounter (INDEPENDENT_AMBULATORY_CARE_PROVIDER_SITE_OTHER): Payer: Self-pay | Admitting: *Deleted

## 2015-10-19 ENCOUNTER — Ambulatory Visit (INDEPENDENT_AMBULATORY_CARE_PROVIDER_SITE_OTHER): Payer: Medicare HMO | Admitting: Internal Medicine

## 2015-10-19 ENCOUNTER — Encounter (INDEPENDENT_AMBULATORY_CARE_PROVIDER_SITE_OTHER): Payer: Self-pay | Admitting: Internal Medicine

## 2015-10-19 VITALS — BP 100/80 | HR 72 | Temp 97.8°F | Ht 69.0 in | Wt 130.0 lb

## 2015-10-19 DIAGNOSIS — F1010 Alcohol abuse, uncomplicated: Secondary | ICD-10-CM | POA: Diagnosis not present

## 2015-10-19 DIAGNOSIS — K7460 Unspecified cirrhosis of liver: Secondary | ICD-10-CM

## 2015-10-19 DIAGNOSIS — R772 Abnormality of alphafetoprotein: Secondary | ICD-10-CM

## 2015-10-19 LAB — PROTIME-INR
INR: 1.18 (ref <1.50)
PROTHROMBIN TIME: 15.2 s (ref 11.6–15.2)

## 2015-10-19 LAB — HEPATIC FUNCTION PANEL
ALT: 22 U/L (ref 9–46)
AST: 30 U/L (ref 10–40)
Albumin: 3.9 g/dL (ref 3.6–5.1)
Alkaline Phosphatase: 83 U/L (ref 40–115)
BILIRUBIN DIRECT: 0.3 mg/dL — AB (ref <=0.2)
Indirect Bilirubin: 0.7 mg/dL (ref 0.2–1.2)
Total Bilirubin: 1 mg/dL (ref 0.2–1.2)
Total Protein: 6.7 g/dL (ref 6.1–8.1)

## 2015-10-19 NOTE — Progress Notes (Addendum)
Subjective:    Patient ID: [name], male    DOB: 14-Oct-1970, 45 y.o.   MRN: [id number]  HPI Referred by Dr. Buelah Manis elevated liver enzymes. Hx of same. He tells me he will drink 7-8 beers a day and sometimes he will skip a day. He usually drinks about 3-4 times a week. His appetite is okay. No weight loss. No abdominal pain. He usually has a BM x 1 day. No melena or BRRB.  He denies IV drug use.     Hx significant for on-ischemic cardiomyopathy, chronic systolic heart failure, and ICD implantation.   09/21/2015 AFP 8.3 07/12/2015 Hepatitis Panel is negative. CMP Latest Ref Rng 09/21/2015 06/23/2015 06/20/2015  Glucose 70 - 99 mg/dL 121(H) 122(H) 127(H)  BUN 7 - 25 mg/dL 7 7 12   Creatinine 0.60 - 1.35 mg/dL 0.78 0.98 0.87  Sodium 135 - 146 mmol/L 136 139 127(L)  Potassium 3.5 - 5.3 mmol/L 4.8 4.5 5.1  Chloride 98 - 110 mmol/L 97(L) 97(L) 91(L)  CO2 20 - 31 mmol/L 30 30 29   Calcium 8.6 - 10.3 mg/dL 9.5 9.7 9.0  Total Protein 6.1 - 8.1 g/dL 7.2 7.2 7.0  Total Bilirubin 0.2 - 1.2 mg/dL 1.0 1.2 1.0  Alkaline Phos 40 - 115 U/L 76 101 98  AST 10 - 40 U/L 39 44(H) 67(H)  ALT 9 - 46 U/L 30 36 39   CBC    Component Value Date/Time   WBC 2.9* 09/21/2015 1210   RBC 4.66 09/21/2015 1210   HGB 14.7 09/21/2015 1210   HCT 44.6 09/21/2015 1210   PLT 223 09/21/2015 1210   MCV 95.7 09/21/2015 1210   MCH 31.5 09/21/2015 1210   MCHC 33.0 09/21/2015 1210   RDW 15.0 09/21/2015 1210   LYMPHSABS 870 09/21/2015 1210   MONOABS 377 09/21/2015 1210   EOSABS 58 09/21/2015 1210   BASOSABS 29 09/21/2015 1210       Hepatic Function Panel     Component Value Date/Time   PROT 7.2 09/21/2015 1210   ALBUMIN 4.3 09/21/2015 1210   AST 39 09/21/2015 1210   ALT 30 09/21/2015 1210   ALKPHOS 76 09/21/2015 1210   BILITOT 1.0 09/21/2015 1210   BILIDIR 0.4* 08/24/2013 1250   IBILI 0.6 08/24/2013 1250   06/30/2015 US abdomen: elevated liver enzymes:   IMPRESSION: 1. Liver is echogenic with a  nodular contour suggesting fatty infiltration/hepatocellular disease and possible cirrhosis. Portal vein is patent with normal directional flow.  2. No gallstones or biliary distention .   Review of Systems Past Medical History  Diagnosis Date  . Cardiomyopathy, nonischemic (HCC)     takes Digoxin daily and Carvedilol daily  . Tobacco abuse   . Orthostatic hypotension   . Alcohol consumption heavy   . Scoliosis   . Wilm's tumor     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 33 (XRT and chemo)  . Hyperlipidemia     was on Simvastatin but has been off a year  . Chronic systolic heart failure ([place])   . Arthritis   . Chronic back pain   . Shortness of breath     lying flat and with exertion  . Low back pain     From Radiation damage was told by [facility] that he has multiple fx in his back  . GERD (gastroesophageal reflux disease)     takes Omeprazole daily  . CHF (congestive heart failure) (HCC)     takes Lasix daily as well Aldactone  . Insomnia  takes Ambien as needed(just got script yesterday)  . Depression     takes Prozac daily    Past Surgical History  Procedure Laterality Date  . Nephrectomy  36 yrs ago    Wilms Tumor  . Icd  05/25/2011    [employer] lead/[employer] single chamber device  . Carpal tunnel release Right 01/09/2013    Procedure: CARPAL TUNNEL RELEASE;  Surgeon: Winfield Cunas, MD;  Location: [facility];  Service: Neurosurgery;  Laterality: Right;  RIGHT carpal tunnel release  . Ulnar nerve transposition Right 01/09/2013    Procedure: ULNAR NERVE DECOMPRESSION/TRANSPOSITION;  Surgeon: Winfield Cunas, MD;  Location: [facility];  Service: Neurosurgery;  Laterality: Right;  RIGHT ulnar nerve decompression  . Carpal tunnel release Left 01/30/2013    Procedure: LEFT CARPAL TUNNEL RELEASE;  Surgeon: Winfield Cunas, MD;  Location: [facility];  Service: Neurosurgery;  Laterality: Left;  LEFT Carpal Tunnel release  . Chest tube insertion Right 09/09/2013     Procedure: INSERTION PLEURAL DRAINAGE CATHETER;  Surgeon: Ivin Poot, MD;  Location: [facility];  Service: Thoracic;  Laterality: Right;  . Implantable cardioverter defibrillator implant N/A 05/25/2011    Procedure: IMPLANTABLE CARDIOVERTER DEFIBRILLATOR IMPLANT;  Surgeon: Evans Lance, MD;  Location: [facility] CATH LAB;  Service: Cardiovascular;  Laterality: N/A;    Allergies  Allergen Reactions  . Ibuprofen Hives  . Nsaids     ELEVATED LFT'S    Current Outpatient Prescriptions on File Prior to Visit  Medication Sig Dispense Refill  . aspirin 81 MG tablet Take 81 mg by mouth daily.      . calcium-vitamin D (OSCAL WITH D) 500-200 MG-UNIT per tablet Take 2 tablets by mouth every morning.     . carvedilol (COREG) 12.5 MG tablet Take 12.5 mg by mouth 2 (two) times daily with a meal. Take 18.75 mg in am and 12.5 mg in pm    . digoxin (LANOXIN) 0.125 MG tablet TAKE ONE TABLET BY MOUTH ONCE DAILY 30 tablet 0  . FLUoxetine (PROZAC) 20 MG capsule TAKE ONE CAPSULE BY MOUTH EVERY MORNING 90 capsule 0  . furosemide (LASIX) 20 MG tablet TAKE ONE TABLET BY MOUTH ONCE DAILY 30 tablet 11  . ivabradine HCl (CORLANOR) 5 MG TABS tablet Take 0.5 tablets (2.5 mg total) by mouth 2 (two) times daily with a meal. 60 tablet 2  . levocetirizine (XYZAL) 5 MG tablet Take 1 tablet (5 mg total) by mouth every evening. 30 tablet 6  . lisinopril (PRINIVIL,ZESTRIL) 2.5 MG tablet Take 1 tablet (2.5 mg total) by mouth daily. Needs office visit 90 tablet 3  . oxyCODONE (ROXICODONE) 5 MG immediate release tablet Take 1 tablet (5 mg total) by mouth 3 (three) times daily as needed for severe pain. 60 tablet 0  . sildenafil (VIAGRA) 100 MG tablet Take 100 mg by mouth daily as needed for erectile dysfunction. Take 1/2 to 1 tablet 1 hour before intercourse    . spironolactone (ALDACTONE) 25 MG tablet Take 1 tablet (25 mg total) by mouth daily. 90 tablet 3  . triamcinolone (NASACORT AQ) 55 MCG/ACT AERO nasal inhaler Place 2 sprays into  the nose daily. 1 Inhaler 12   No current facility-administered medications on file prior to visit.        Objective:   Physical Exam Blood pressure 100/80, pulse 72, temperature 97.8 F (36.6 C), height 5\' 9"  (1.753 m), weight 130 lb (58.968 kg).  Alert and oriented. Skin  warm and dry. Oral mucosa is moist.   . Sclera anicteric, conjunctivae is pink. Thyroid not enlarged. No cervical lymphadenopathy. Lungs clear. Heart regular rate and rhythm.  Abdomen is soft. Bowel sounds are positive. No hepatomegaly. No abdominal masses felt. No tenderness.  No edema to lower extremities.        Assessment & Plan:  Probably alcoholic cirrhosis. Patient needs to stop drinking. Hepatic function today. PT/INR. OV in 6 months.  CT abdomen/pelvis for elevated AFP

## 2015-10-19 NOTE — Patient Instructions (Addendum)
OV 6 months.  CT abdomen/pelvis with CM.

## 2015-10-20 ENCOUNTER — Telehealth (HOSPITAL_COMMUNITY): Payer: Self-pay | Admitting: *Deleted

## 2015-10-20 DIAGNOSIS — R772 Abnormality of alphafetoprotein: Secondary | ICD-10-CM

## 2015-10-20 MED ORDER — DIGOXIN 125 MCG PO TABS: 125.0000 ug | ORAL_TABLET | Freq: Every day | ORAL | Status: DC

## 2015-10-20 MED ORDER — CARVEDILOL 12.5 MG PO TABS: 12.5000 mg | ORAL_TABLET | Freq: Two times a day (BID) | ORAL | Status: DC

## 2015-10-24 NOTE — Addendum Note (Signed)
Addended by: Butch Penny on: 10/24/2015 04:05 PM   Modules accepted: Orders

## 2015-10-24 NOTE — Telephone Encounter (Signed)
[place] sch'd 11/28/15 at 830 am (815), npo after midnight, left detailed message for patient

## 2015-10-24 NOTE — Addendum Note (Signed)
Addended by: Butch Penny on: 10/24/2015 04:03 PM   Modules accepted: Orders

## 2015-10-28 ENCOUNTER — Ambulatory Visit (HOSPITAL_COMMUNITY): Admission: RE | Admit: 2015-10-28 | Payer: Medicare HMO | Source: Ambulatory Visit

## 2015-11-01 ENCOUNTER — Other Ambulatory Visit (HOSPITAL_COMMUNITY): Payer: Self-pay | Admitting: *Deleted

## 2015-11-01 MED ORDER — FLUOXETINE HCL 20 MG PO CAPS: 20.0000 mg | ORAL_CAPSULE | Freq: Every morning | ORAL | Status: DC

## 2015-11-10 ENCOUNTER — Telehealth: Payer: Self-pay | Admitting: Family Medicine

## 2015-11-10 NOTE — Telephone Encounter (Signed)
Ok to refill??  Last office visit/ refill 09/21/2015.

## 2015-11-10 NOTE — Telephone Encounter (Signed)
Patient requesting rx for oxycodone  [phone]

## 2015-11-11 MED ORDER — OXYCODONE HCL 5 MG PO TABS: 5.0000 mg | ORAL_TABLET | Freq: Three times a day (TID) | ORAL | Status: DC | PRN

## 2015-11-11 NOTE — Telephone Encounter (Signed)
Prescription printed and patient made aware to come to office to pick up after 2pm on 11/11/2015 per VM.

## 2015-11-11 NOTE — Telephone Encounter (Signed)
Okay to refill? 

## 2015-12-12 ENCOUNTER — Telehealth: Payer: Self-pay | Admitting: Family Medicine

## 2015-12-12 MED ORDER — OXYCODONE HCL 5 MG PO TABS: 5.0000 mg | ORAL_TABLET | Freq: Three times a day (TID) | ORAL | Status: DC | PRN

## 2015-12-12 NOTE — Telephone Encounter (Signed)
okay

## 2015-12-12 NOTE — Telephone Encounter (Signed)
Prescription printed and patient made aware to come to office to pick up.  

## 2015-12-12 NOTE — Telephone Encounter (Signed)
Pt is requesting a refill of Oxycodone 5 mg. [phone]

## 2015-12-12 NOTE — Telephone Encounter (Signed)
Ok to refill??  Last office visit 09/21/2015.  Last refill 11/11/2015.

## 2016-01-03 ENCOUNTER — Other Ambulatory Visit (HOSPITAL_COMMUNITY): Payer: Self-pay | Admitting: Adult Health

## 2016-01-16 ENCOUNTER — Telehealth: Payer: Self-pay | Admitting: *Deleted

## 2016-01-16 MED ORDER — OXYCODONE HCL 5 MG PO TABS: 5.0000 mg | ORAL_TABLET | Freq: Three times a day (TID) | ORAL | 0 refills | Status: DC | PRN

## 2016-01-16 NOTE — Telephone Encounter (Signed)
Okay to refill? 

## 2016-01-16 NOTE — Telephone Encounter (Signed)
Prescription printed and patient made aware to come to office to pick up after 4pm on 01/16/2016 per VM.

## 2016-01-16 NOTE — Telephone Encounter (Signed)
Received call from patient.   Requested refill on Oxycodone.   Ok to refill??  Last office visit 09/21/2015.  Last refill 12/12/2015.

## 2016-01-23 ENCOUNTER — Ambulatory Visit (INDEPENDENT_AMBULATORY_CARE_PROVIDER_SITE_OTHER): Payer: Medicare HMO | Admitting: Family Medicine

## 2016-01-23 ENCOUNTER — Encounter: Payer: Self-pay | Admitting: Family Medicine

## 2016-01-23 VITALS — BP 122/74 | HR 88 | Temp 98.0°F | Resp 14 | Ht 69.0 in | Wt 130.0 lb

## 2016-01-23 DIAGNOSIS — R7302 Impaired glucose tolerance (oral): Secondary | ICD-10-CM

## 2016-01-23 DIAGNOSIS — E785 Hyperlipidemia, unspecified: Secondary | ICD-10-CM | POA: Diagnosis not present

## 2016-01-23 DIAGNOSIS — H6121 Impacted cerumen, right ear: Secondary | ICD-10-CM

## 2016-01-23 DIAGNOSIS — G894 Chronic pain syndrome: Secondary | ICD-10-CM

## 2016-01-23 DIAGNOSIS — J0100 Acute maxillary sinusitis, unspecified: Secondary | ICD-10-CM | POA: Diagnosis not present

## 2016-01-23 DIAGNOSIS — K7460 Unspecified cirrhosis of liver: Secondary | ICD-10-CM

## 2016-01-23 LAB — CBC WITH DIFFERENTIAL/PLATELET
Basophils Absolute: 0 {cells}/uL (ref 0–200)
Basophils Relative: 0 %
Eosinophils Absolute: 60 {cells}/uL (ref 15–500)
Eosinophils Relative: 1 %
HCT: 43.6 % (ref 38.5–50.0)
Hemoglobin: 14.6 g/dL (ref 13.0–17.0)
Lymphs Abs: 1320 {cells}/uL (ref 850–3900)
MCH: 31.3 pg (ref 27.0–33.0)
MCHC: 33.5 g/dL (ref 32.0–36.0)
MCV: 93.4 fL (ref 80.0–100.0)
MPV: 10.4 fL (ref 7.5–12.5)
Monocytes Absolute: 660 {cells}/uL (ref 200–950)
Monocytes Relative: 11 %
Neutro Abs: 3960 {cells}/uL (ref 1500–7800)
Neutrophils Relative %: 66 %
Platelets: 278 K/uL (ref 140–400)
RBC: 4.67 MIL/uL (ref 4.20–5.80)
RDW: 15.4 % — ABNORMAL HIGH (ref 11.0–15.0)
WBC: 6 K/uL (ref 3.8–10.8)

## 2016-01-23 LAB — COMPREHENSIVE METABOLIC PANEL WITH GFR
ALT: 14 U/L (ref 9–46)
AST: 23 U/L (ref 10–40)
Albumin: 3.9 g/dL (ref 3.6–5.1)
Alkaline Phosphatase: 117 U/L — ABNORMAL HIGH (ref 40–115)
BUN: 13 mg/dL (ref 7–25)
CO2: 26 mmol/L (ref 20–31)
Calcium: 9.1 mg/dL (ref 8.6–10.3)
Chloride: 97 mmol/L — ABNORMAL LOW (ref 98–110)
Creat: 1.35 mg/dL (ref 0.60–1.35)
Glucose, Bld: 119 mg/dL — ABNORMAL HIGH (ref 70–99)
Potassium: 4 mmol/L (ref 3.5–5.3)
Sodium: 136 mmol/L (ref 135–146)
Total Bilirubin: 1.5 mg/dL — ABNORMAL HIGH (ref 0.2–1.2)
Total Protein: 7 g/dL (ref 6.1–8.1)

## 2016-01-23 LAB — LIPID PANEL
Cholesterol: 156 mg/dL (ref 125–200)
HDL: 28 mg/dL — ABNORMAL LOW
LDL Cholesterol: 111 mg/dL
Total CHOL/HDL Ratio: 5.6 ratio — ABNORMAL HIGH
Triglycerides: 86 mg/dL
VLDL: 17 mg/dL

## 2016-01-23 MED ORDER — LEVOCETIRIZINE DIHYDROCHLORIDE 5 MG PO TABS: 5.0000 mg | ORAL_TABLET | Freq: Every evening | ORAL | 6 refills | Status: DC

## 2016-01-23 MED ORDER — TRIAMCINOLONE ACETONIDE 55 MCG/ACT NA AERO: 2.0000 | INHALATION_SPRAY | Freq: Every day | NASAL | 12 refills | Status: DC

## 2016-01-23 MED ORDER — AMOXICILLIN 875 MG PO TABS: 875.0000 mg | ORAL_TABLET | Freq: Two times a day (BID) | ORAL | 0 refills | Status: DC

## 2016-01-23 MED ORDER — METHYLPREDNISOLONE ACETATE 40 MG/ML IJ SUSP
40.0000 mg | Freq: Once | INTRAMUSCULAR | Status: AC
Start: 2016-01-23 — End: 2016-01-23
  Administered 2016-01-23: 40 mg via INTRAMUSCULAR

## 2016-01-23 NOTE — Addendum Note (Signed)
Addended by: Sheral Flow on: 01/23/2016 12:49 PM   Modules accepted: Orders

## 2016-01-23 NOTE — Progress Notes (Signed)
   Subjective:    Patient ID: [name], male    DOB: 02-16-1971, 45 y.o.   MRN: [id number]  Patient presents for 4 month F/U (is fasting) Here to follow-up chronic medical problems. He was seen by gastroenterology after his AFP level was elevated in the setting of his cirrhosis. They tends to get a CT of abdomen and pelvis however his insurance would not cover. He is follow-up with him in November. His liver function studies have been stable. His only concern is sinus pressure and drainage worsening over the past week. He has pain in his left ear. He has not had any fever or chills.  He is due for repeat labs with cholesterol also glucose intolerance last A1c 6.3% in January  He is still taking all of his medications as prescribed by cardiology    Review Of Systems:  GEN- denies fatigue, fever, weight loss,weakness, recent illness HEENT- denies eye drainage, change in vision,+ nasal discharge, CVS- denies chest pain, palpitations RESP- denies SOB, cough, wheeze ABD- denies N/V, change in stools, abd pain GU- denies dysuria, hematuria, dribbling, incontinence MSK-+ joint pain,  Denies muscle aches, injury Neuro- denies headache, dizziness, syncope, seizure activity       Objective:    BP 122/74 (BP Location: Left Arm, Patient Position: Sitting, Cuff Size: Normal)   Pulse 88   Temp 98 F (36.7 C) (Oral)   Resp 14   Ht 5\' 9"  (1.753 m)   Wt 130 lb (59 kg)   BMI 19.20 kg/m  GEN- NAD, alert and oriented x3 HEENT- PERRL, EOMI, non injected sclera, pink conjunctiva, MMM, oropharynx clear, nares +rhimorrhea, enlarged turbinates, mild TTP maxillary sinus , Right canal impacted wax, left canal clear, TM Neck- Supple, no thyromegaly, no LAD  CVS- RRR, no murmur RESP-CTAB EXT- No edema Pulses- Radial, DP- 2+        Assessment & Plan:      Problem List Items Addressed This Visit    Hyperlipidemia - Primary    Recheck, labs also with glucose intolerance, check A1C       Relevant Orders   Lipid panel   Glucose intolerance (impaired glucose tolerance)   Relevant Orders   Hemoglobin A1c   Cirrhosis of liver (HCC)    Continue f/u with GI Discussed avoiding all ETOH      Relevant Orders   CBC with Differential/Platelet   Comprehensive metabolic panel   Chronic pain syndrome    Other Visit Diagnoses    Acute maxillary sinusitis, recurrence not specified       Depo Medrol given,restart nasal spray, continue xyzal, amox given   Cerumen impaction, right       s/p irrigation no sign of infection      Note: This dictation was prepared with Dragon dictation along with smaller phrase technology. Any transcriptional errors that result from this process are unintentional.

## 2016-01-23 NOTE — Assessment & Plan Note (Signed)
Continue f/u with GI Discussed avoiding all ETOH

## 2016-01-23 NOTE — Assessment & Plan Note (Signed)
Recheck, labs also with glucose intolerance, check A1C

## 2016-01-24 LAB — HEMOGLOBIN A1C
HEMOGLOBIN A1C: 6.7 % — AB (ref <5.7)
Mean Plasma Glucose: 146 mg/dL

## 2016-02-16 ENCOUNTER — Telehealth: Payer: Self-pay | Admitting: *Deleted

## 2016-02-16 NOTE — Telephone Encounter (Signed)
Received call from patient.   Requested refill on Oxycodone.   Ok to refill??  Last office visit 01/23/2016.  Last refill 01/15/2013.

## 2016-02-17 MED ORDER — OXYCODONE HCL 5 MG PO TABS: 5.0000 mg | ORAL_TABLET | Freq: Three times a day (TID) | ORAL | 0 refills | Status: DC | PRN

## 2016-02-17 NOTE — Telephone Encounter (Signed)
Prescription printed and patient made aware to come to office to pick up on 02/17/2016 per VM.

## 2016-02-17 NOTE — Telephone Encounter (Signed)
okay

## 2016-03-02 ENCOUNTER — Ambulatory Visit (INDEPENDENT_AMBULATORY_CARE_PROVIDER_SITE_OTHER): Payer: Medicare HMO | Admitting: *Deleted

## 2016-03-02 ENCOUNTER — Encounter: Payer: Self-pay | Admitting: Internal Medicine

## 2016-03-02 DIAGNOSIS — I5023 Acute on chronic systolic (congestive) heart failure: Secondary | ICD-10-CM | POA: Diagnosis not present

## 2016-03-02 DIAGNOSIS — I429 Cardiomyopathy, unspecified: Secondary | ICD-10-CM

## 2016-03-02 DIAGNOSIS — I420 Dilated cardiomyopathy: Secondary | ICD-10-CM

## 2016-03-02 LAB — CUP PACEART INCLINIC DEVICE CHECK
Date Time Interrogation Session: 20170922155937
HighPow Impedance: 67 Ohm
Implantable Lead Implant Date: 20121214
Implantable Lead Serial Number: 307189
Lead Channel Sensing Intrinsic Amplitude: 14.8 mV
MDC IDC LEAD LOCATION: 753860
MDC IDC LEAD MODEL: 180
MDC IDC MSMT BATTERY REMAINING LONGEVITY: 114 mo
MDC IDC MSMT LEADCHNL RV IMPEDANCE VALUE: 508 Ohm
MDC IDC MSMT LEADCHNL RV PACING THRESHOLD AMPLITUDE: 0.8 V
MDC IDC MSMT LEADCHNL RV PACING THRESHOLD PULSEWIDTH: 0.4 ms
MDC IDC PG SERIAL: 118994
MDC IDC SET LEADCHNL RV PACING AMPLITUDE: 2.4 V
MDC IDC SET LEADCHNL RV PACING PULSEWIDTH: 0.4 ms
MDC IDC SET LEADCHNL RV SENSING SENSITIVITY: 0.4 mV
MDC IDC STAT BRADY RV PERCENT PACED: 1 % — AB

## 2016-03-02 NOTE — Progress Notes (Signed)
ICD check in clinic. Normal device function. Threshold and sensing consistent with previous device measurements. Impedance trends stable over time. (15) total "ventricular arrhythmias"--VT-NS and SVT per available EGMs. Histogram distribution appropriate for patient and level of activity. No changes made this session. Device programmed at appropriate safety margins. Device programmed to optimize intrinsic conduction. Estimated longevity 9.5 years. Pt will follow up with GT in 04-2016.

## 2016-03-19 ENCOUNTER — Other Ambulatory Visit: Payer: Self-pay

## 2016-03-19 NOTE — Telephone Encounter (Signed)
Okay to refill? 

## 2016-03-19 NOTE — Telephone Encounter (Signed)
Last OV 01-23-16 Last refill 9-8--17 Okay to refill?

## 2016-03-20 MED ORDER — OXYCODONE HCL 5 MG PO TABS: 5.0000 mg | ORAL_TABLET | Freq: Three times a day (TID) | ORAL | 0 refills | Status: DC | PRN

## 2016-03-20 NOTE — Telephone Encounter (Signed)
Refilled per protocol can be picked up after 2pm on 03/20/16

## 2016-04-12 ENCOUNTER — Encounter: Payer: Self-pay | Admitting: Internal Medicine

## 2016-04-12 ENCOUNTER — Ambulatory Visit (INDEPENDENT_AMBULATORY_CARE_PROVIDER_SITE_OTHER): Payer: Medicare HMO | Admitting: Internal Medicine

## 2016-04-12 VITALS — BP 112/70 | HR 90 | Ht 69.0 in | Wt 132.0 lb

## 2016-04-12 DIAGNOSIS — I429 Cardiomyopathy, unspecified: Secondary | ICD-10-CM

## 2016-04-12 DIAGNOSIS — I5022 Chronic systolic (congestive) heart failure: Secondary | ICD-10-CM | POA: Diagnosis not present

## 2016-04-12 DIAGNOSIS — I420 Dilated cardiomyopathy: Secondary | ICD-10-CM

## 2016-04-12 DIAGNOSIS — I340 Nonrheumatic mitral (valve) insufficiency: Secondary | ICD-10-CM | POA: Diagnosis not present

## 2016-04-12 LAB — CUP PACEART INCLINIC DEVICE CHECK
HighPow Impedance: 67 Ohm
HighPow Impedance: 67 Ohm
Implantable Lead Implant Date: 20121214
Implantable Lead Location: 753860
Lead Channel Sensing Intrinsic Amplitude: 13.9 mV
Lead Channel Setting Pacing Amplitude: 2.4 V
Lead Channel Setting Pacing Pulse Width: 0.4 ms
Lead Channel Setting Sensing Sensitivity: 0.4 mV
MDC IDC LEAD MODEL: 180
MDC IDC LEAD SERIAL: 307189
MDC IDC MSMT LEADCHNL RV IMPEDANCE VALUE: 511 Ohm
MDC IDC MSMT LEADCHNL RV PACING THRESHOLD AMPLITUDE: 0.8 V
MDC IDC MSMT LEADCHNL RV PACING THRESHOLD PULSEWIDTH: 0.4 ms
MDC IDC PG IMPLANT DT: 20121214
MDC IDC PG SERIAL: 118994
MDC IDC SESS DTM: 20171102040000

## 2016-04-12 NOTE — Patient Instructions (Signed)
Your physician wants you to follow-up in: 1 Year with Dr. Lovena Le. You will receive a reminder letter in the mail two months in advance. If you don't receive a letter, please call our office to schedule the follow-up appointment.  Your physician recommends that you schedule a follow-up appointment in: 3 Months at the [facility] physician has requested that you have an echocardiogram. Echocardiography is a painless test that uses sound waves to create images of your heart. It provides your doctor with information about the size and shape of your heart and how well your heart's chambers and valves are working. This procedure takes approximately one hour. There are no restrictions for this procedure.  If you need a refill on your cardiac medications before your next appointment, please call your pharmacy.  Thank you for choosing [facility]!

## 2016-04-12 NOTE — Progress Notes (Signed)
HPI [name] returns today for followup. He is a very pleasant 45 year old man with a nonischemic cardiomyopathy, chronic systolic heart failure, status post ICD implantation. The patient denies syncope, chest pain, or peripheral edema. Despite his multiple problems, he remains active. He has class 2B symptoms. No other complaints today. 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 He has noted some increased dyspnea.  Allergies  Allergen Reactions  . Ibuprofen Hives  . Nsaids     ELEVATED LFT'S     Current Outpatient Prescriptions  Medication Sig Dispense Refill  . aspirin 81 MG tablet Take 81 mg by mouth daily.      . calcium-vitamin D (OSCAL WITH D) 500-200 MG-UNIT per tablet Take 2 tablets by mouth every morning.     . carvedilol (COREG) 12.5 MG tablet Take 1 tablet (12.5 mg total) by mouth 2 (two) times daily with a meal. Take 18.75 mg in am and 12.5 mg in pm 90 tablet 3  . digoxin (LANOXIN) 0.125 MG tablet Take 1 tablet (125 mcg total) by mouth daily. 30 tablet 0  . FLUoxetine (PROZAC) 20 MG capsule Take 1 capsule (20 mg total) by mouth every morning. 90 capsule 6  . furosemide (LASIX) 20 MG tablet TAKE ONE TABLET BY MOUTH ONCE DAILY 30 tablet 11  . ivabradine HCl (CORLANOR) 5 MG TABS tablet Take 0.5 tablets (2.5 mg total) by mouth 2 (two) times daily with a meal. 60 tablet 2  . levocetirizine (XYZAL) 5 MG tablet Take 1 tablet (5 mg total) by mouth every evening. 30 tablet 6  . lisinopril (PRINIVIL,ZESTRIL) 2.5 MG tablet Take 1 tablet (2.5 mg total) by mouth daily. Needs office visit 90 tablet 3  . oxyCODONE (ROXICODONE) 5 MG immediate release tablet Take 1 tablet (5 mg total) by mouth 3 (three) times daily as needed for severe pain. 60 tablet 0  . sildenafil (VIAGRA) 100 MG tablet Take 100 mg by mouth daily as needed for erectile dysfunction. Take 1/2 to 1 tablet 1 hour before intercourse    . spironolactone (ALDACTONE) 25 MG tablet Take 1 tablet (25 mg total) by  mouth daily. 90 tablet 3  . triamcinolone (NASACORT AQ) 55 MCG/ACT AERO nasal inhaler Place 2 sprays into the nose daily. 1 Inhaler 12   No current facility-administered medications for this visit.      Past Medical History:  Diagnosis Date  . Alcohol consumption heavy   . Arthritis   . Cardiomyopathy, nonischemic (HCC)    takes Digoxin daily and Carvedilol daily  . CHF (congestive heart failure) (HCC)    takes Lasix daily as well Aldactone  . Chronic back pain   . Chronic systolic heart failure ([place])   . Depression    takes Prozac daily  . GERD (gastroesophageal reflux disease)    takes Omeprazole daily  . Hyperlipidemia    was on Simvastatin but has been off a year  . Insomnia    takes Ambien as needed(just got script yesterday)  . Low back pain    From Radiation damage was told by [facility] that he has multiple fx in his back  . Orthostatic hypotension   . Scoliosis   . Shortness of breath    lying flat and with exertion  . Tobacco abuse   . 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 45 (XRT and chemo)    ROS:   All systems reviewed and negative except as noted in the HPI.   Past  Surgical History:  Procedure Laterality Date  . CARPAL TUNNEL RELEASE Right 01/09/2013   Procedure: CARPAL TUNNEL RELEASE;  Surgeon: Winfield Cunas, MD;  Location: [place] NEURO ORS;  Service: Neurosurgery;  Laterality: Right;  RIGHT carpal tunnel release  . CARPAL TUNNEL RELEASE Left 01/30/2013   Procedure: LEFT CARPAL TUNNEL RELEASE;  Surgeon: Winfield Cunas, MD;  Location: [place] NEURO ORS;  Service: Neurosurgery;  Laterality: Left;  LEFT Carpal Tunnel release  . CHEST TUBE INSERTION Right 09/09/2013   Procedure: INSERTION PLEURAL DRAINAGE CATHETER;  Surgeon: Ivin Poot, MD;  Location: [place];  Service: Thoracic;  Laterality: Right;  . ICD  05/25/2011   [employer] SG lead/[employer] single chamber device  . IMPLANTABLE CARDIOVERTER DEFIBRILLATOR IMPLANT N/A 05/25/2011   Procedure:  IMPLANTABLE CARDIOVERTER DEFIBRILLATOR IMPLANT;  Surgeon: Evans Lance, MD;  Location: [facility] CATH LAB;  Service: Cardiovascular;  Laterality: N/A;  . NEPHRECTOMY  36 yrs ago   Wilms Tumor  . ULNAR NERVE TRANSPOSITION Right 01/09/2013   Procedure: ULNAR NERVE DECOMPRESSION/TRANSPOSITION;  Surgeon: Winfield Cunas, MD;  Location: [place] NEURO ORS;  Service: Neurosurgery;  Laterality: Right;  RIGHT ulnar nerve decompression     No family history on file.   Social History   Social History  . Marital status: Legally Separated    Spouse name: N/A  . Number of children: 1  . Years of education: N/A   Occupational History  . Full time     Engineer, civil (consulting)   Social History Main Topics  . Smoking status: Former Smoker    Packs/day: 0.25    Years: 20.00    Types: Cigarettes    Quit date: 09/07/2013  . Smokeless tobacco: Current User    Types: Snuff, Chew     Comment: quit smoking the 1st of Jan 2015. dip snuff  . Alcohol use No  . Drug use: No  . Sexual activity: Not Currently   Other Topics Concern  . Not on file   Social History Narrative  . No narrative on file     BP 112/70   Pulse 90   Ht 5\' 9"  (1.753 m)   Wt 132 lb (59.9 kg)   SpO2 99%   BMI 19.49 kg/m   Physical Exam:  Well appearing 45 year old man, NAD HEENT: Unremarkable Neck:  6 cm JVD, no thyromegally Back:  No CVA tenderness, marked kyphoscoliosis. Lungs:  Clear with no wheezes, rales, or rhonchi. Well-healed ICD incision. HEART:  Regular rate rhythm, 2/6 systolic murmur, no rubs, no clicks Abd:  soft, scaphoid, positive bowel sounds, no organomegally, no rebound, no guarding Ext:  2 plus pulses, no edema, no cyanosis, no clubbing Skin:  No rashes no nodules Neuro:  CN II through XII intact, motor grossly intact  DEVICE  Normal device function.  See PaceArt for details.   Assess/Plan:  1. VT - he has had a couple of non-sustained episodes. Will follow. No AA drugs at this time. 2. Chronic systolic heart  failure - his symptoms are class 2B. He is stoic. I will consider switching him to [facility] after his echo. 3. MR - on exam today, he has a MR murmur which he has not had in the past. I have recommended he undergo 2D echo.  4. ICD - his [employer] device is working normally. Will recheck in several months.  [name].D.

## 2016-04-16 DIAGNOSIS — I110 Hypertensive heart disease with heart failure: Secondary | ICD-10-CM | POA: Diagnosis not present

## 2016-04-16 DIAGNOSIS — I420 Dilated cardiomyopathy: Secondary | ICD-10-CM | POA: Diagnosis not present

## 2016-04-16 DIAGNOSIS — I447 Left bundle-branch block, unspecified: Secondary | ICD-10-CM | POA: Diagnosis not present

## 2016-04-16 DIAGNOSIS — Z9114 Patient's other noncompliance with medication regimen: Secondary | ICD-10-CM | POA: Diagnosis not present

## 2016-04-16 DIAGNOSIS — I081 Rheumatic disorders of both mitral and tricuspid valves: Secondary | ICD-10-CM | POA: Diagnosis not present

## 2016-04-16 DIAGNOSIS — I5020 Unspecified systolic (congestive) heart failure: Secondary | ICD-10-CM | POA: Diagnosis not present

## 2016-04-16 DIAGNOSIS — R55 Syncope and collapse: Secondary | ICD-10-CM | POA: Diagnosis not present

## 2016-04-16 DIAGNOSIS — W19XXXA Unspecified fall, initial encounter: Secondary | ICD-10-CM | POA: Diagnosis not present

## 2016-04-16 DIAGNOSIS — I959 Hypotension, unspecified: Secondary | ICD-10-CM | POA: Diagnosis not present

## 2016-04-17 DIAGNOSIS — R55 Syncope and collapse: Secondary | ICD-10-CM | POA: Diagnosis not present

## 2016-04-17 DIAGNOSIS — I5022 Chronic systolic (congestive) heart failure: Secondary | ICD-10-CM | POA: Diagnosis not present

## 2016-04-17 DIAGNOSIS — I340 Nonrheumatic mitral (valve) insufficiency: Secondary | ICD-10-CM | POA: Diagnosis not present

## 2016-04-17 DIAGNOSIS — I420 Dilated cardiomyopathy: Secondary | ICD-10-CM | POA: Diagnosis not present

## 2016-04-18 ENCOUNTER — Telehealth: Payer: Self-pay | Admitting: Internal Medicine

## 2016-04-18 DIAGNOSIS — R55 Syncope and collapse: Secondary | ICD-10-CM | POA: Diagnosis not present

## 2016-04-18 DIAGNOSIS — I340 Nonrheumatic mitral (valve) insufficiency: Secondary | ICD-10-CM | POA: Diagnosis not present

## 2016-04-18 DIAGNOSIS — I420 Dilated cardiomyopathy: Secondary | ICD-10-CM | POA: Diagnosis not present

## 2016-04-18 DIAGNOSIS — I5022 Chronic systolic (congestive) heart failure: Secondary | ICD-10-CM | POA: Diagnosis not present

## 2016-04-18 NOTE — Telephone Encounter (Signed)
Dr.Kotlaba is wanting to speak with Dr.Taylor about [name] . Please call him back on his cell phone at [phone].. Thanks

## 2016-04-19 ENCOUNTER — Encounter (INDEPENDENT_AMBULATORY_CARE_PROVIDER_SITE_OTHER): Payer: Self-pay | Admitting: Internal Medicine

## 2016-04-19 ENCOUNTER — Ambulatory Visit (INDEPENDENT_AMBULATORY_CARE_PROVIDER_SITE_OTHER): Payer: Medicare HMO | Admitting: Internal Medicine

## 2016-04-20 ENCOUNTER — Ambulatory Visit (INDEPENDENT_AMBULATORY_CARE_PROVIDER_SITE_OTHER): Payer: Medicare HMO | Admitting: Internal Medicine

## 2016-04-24 ENCOUNTER — Ambulatory Visit: Payer: Medicare HMO | Admitting: Family Medicine

## 2016-04-24 ENCOUNTER — Encounter: Payer: Self-pay | Admitting: Physician Assistant

## 2016-04-24 ENCOUNTER — Ambulatory Visit (INDEPENDENT_AMBULATORY_CARE_PROVIDER_SITE_OTHER): Payer: Medicare HMO | Admitting: Physician Assistant

## 2016-04-24 VITALS — BP 110/80 | HR 97 | Ht 69.0 in | Wt 138.0 lb

## 2016-04-24 DIAGNOSIS — I5023 Acute on chronic systolic (congestive) heart failure: Secondary | ICD-10-CM

## 2016-04-24 DIAGNOSIS — Z720 Tobacco use: Secondary | ICD-10-CM | POA: Insufficient documentation

## 2016-04-24 DIAGNOSIS — I472 Ventricular tachycardia: Secondary | ICD-10-CM | POA: Diagnosis not present

## 2016-04-24 DIAGNOSIS — I4729 Other ventricular tachycardia: Secondary | ICD-10-CM

## 2016-04-24 DIAGNOSIS — I420 Dilated cardiomyopathy: Secondary | ICD-10-CM | POA: Diagnosis not present

## 2016-04-24 DIAGNOSIS — R55 Syncope and collapse: Secondary | ICD-10-CM

## 2016-04-24 LAB — BASIC METABOLIC PANEL
BUN: 9 mg/dL (ref 7–25)
CO2: 31 mmol/L (ref 20–31)
Calcium: 9.1 mg/dL (ref 8.6–10.3)
Chloride: 96 mmol/L — ABNORMAL LOW (ref 98–110)
Creat: 1.06 mg/dL (ref 0.60–1.35)
GLUCOSE: 103 mg/dL — AB (ref 65–99)
POTASSIUM: 3.1 mmol/L — AB (ref 3.5–5.3)
SODIUM: 140 mmol/L (ref 135–146)

## 2016-04-24 NOTE — Progress Notes (Addendum)
Cardiology Office Note Date:  04/24/2016  Patient ID:  [name], DOB [birth year], MRN [id number] PCP:  [name], MD  Cardiologist: [place] (last 2015) Electrophysiologist: Dr. Lovena Le   Chief Complaint: syncope  History of Present Illness: [name] is a 45 y.o. male with history of NICM, chronic CHF (systolic), HLD, GERD, arthritis, comes to the office to day to be seen for Dr. Lovena Le, last seen by recently in 04/12/16, at that time doing faily well, had some NSVT on his device check, decided to hold of on AAD, noted on his exam a new murmur likley MR and ordered an echo, pending these finiding consideration for Entresto.  He comes in today unfortunately suffered a syncopal event 04/16/16.  States he felt fine that morning and the days leading up to it.  He had ran some errands, stopped and grabbed a biscuit and tea, he reports having taken his meds as usaual shortly prior, he was talking to a guy working on his well when he didn't feel well and was going to g inside, "felt like the biscuit landed like a rock in my stomach", the next thing he knew he woke on the ground in his driveway, drenched in sweat bleeding from his face.  He called the guy who helped him up and drove him tot he ER in [place], en-route he vomited and immediately felt much better.  He had no CP of any kind before or after the event, no SOB, no palpitations.  He was upset that they held his lasix, coming out of the hospital weighing 141lbs.  That evening her took a lasxi the day after took an extr dose, and the morning after took an extra 1/2 tab.    Record available is a consult note reported hi BP supine 87/71 HR 79 and sitting 95/71, HR 86, reports his ICD was checked noting a NSVT of 5 beats earlier in the day, nothing associated with his syncope.   The record states that he reported to them he had been non-complaint with his meds, often not taking them, and hod not been taking them at all for several days  and had resumed them that day or the day prior and became hypotensive, possibly orthostatic, vagal as well.  [facility] LABS: K+ 3.4 Creat 1.53 >> hydration >> 1.28 Trop 0.069, 0.063, 0.055 H/H 13/40 CT maxillofacial: no fx CT head: no acute findings CXR tiny right effusion Carotid US: normal, no hemodynamically significant stenosis EKG was described a ILBBB  The patient tells me that he had only missed a couple days of the lisinopril and coreg only, admits that he has done this before without having any trouble. In review of his meds in detail, only the medications listed today are the meds he had been taking, none of the other on his previous med list   DEVICE information: [employer] single chamber ICD, implanted 05/25/11, primary prevention, Dr. Lovena Le   Past Medical History:  Diagnosis Date  . Arthritis   . Cardiomyopathy, nonischemic (HCC)    takes Digoxin daily and Carvedilol daily  . CHF (congestive heart failure) (HCC)    takes Lasix daily as well Aldactone  . Chronic back pain   . Chronic systolic heart failure ([place])   . Depression    takes Prozac daily  . GERD (gastroesophageal reflux disease)    takes Omeprazole daily  . Hx of Alcohol consumption heavy    rare beer currently  . hx of Tobacco abuse  quit smoking 2014  . Hyperlipidemia    was on Simvastatin but has been off a year  . Insomnia    takes Ambien as needed(just got script yesterday)  . Orthostatic hypotension   . Scoliosis   . Shortness of breath    lying flat and with exertion  . 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 56 (XRT and chemo)    Past Surgical History:  Procedure Laterality Date  . CARPAL TUNNEL RELEASE Right 01/09/2013   Procedure: CARPAL TUNNEL RELEASE;  Surgeon: Winfield Cunas, MD;  Location: [place] NEURO ORS;  Service: Neurosurgery;  Laterality: Right;  RIGHT carpal tunnel release  . CARPAL TUNNEL RELEASE Left 01/30/2013   Procedure: LEFT CARPAL TUNNEL RELEASE;  Surgeon: Winfield Cunas,  MD;  Location: [place] NEURO ORS;  Service: Neurosurgery;  Laterality: Left;  LEFT Carpal Tunnel release  . CHEST TUBE INSERTION Right 09/09/2013   Procedure: INSERTION PLEURAL DRAINAGE CATHETER;  Surgeon: Ivin Poot, MD;  Location: [place];  Service: Thoracic;  Laterality: Right;  . ICD  05/25/2011   [employer] SG lead/Energen single chamber device  . IMPLANTABLE CARDIOVERTER DEFIBRILLATOR IMPLANT N/A 05/25/2011   Procedure: IMPLANTABLE CARDIOVERTER DEFIBRILLATOR IMPLANT;  Surgeon: Evans Lance, MD;  [facility];  Service: Cardiovascular;  Laterality: N/A;  . NEPHRECTOMY  36 yrs ago   Wilms Tumor  . ULNAR NERVE TRANSPOSITION Right 01/09/2013   Procedure: ULNAR NERVE DECOMPRESSION/TRANSPOSITION;  Surgeon: Winfield Cunas, MD;  Location: [place] NEURO ORS;  Service: Neurosurgery;  Laterality: Right;  RIGHT ulnar nerve decompression    Current Outpatient Prescriptions  Medication Sig Dispense Refill  . aspirin 81 MG tablet Take 81 mg by mouth daily.      . carvedilol (COREG) 3.125 MG tablet Take by mouth 2 (two) times daily with a meal.     . furosemide (LASIX) 20 MG tablet TAKE ONE TABLET BY MOUTH ONCE DAILY 30 tablet 11  . lisinopril (PRINIVIL,ZESTRIL) 2.5 MG tablet Take 1 tablet (2.5 mg total) by mouth daily. Needs office visit (Patient taking differently: Take 2.5 mg by mouth daily. ) 90 tablet 3   No current facility-administered medications for this visit.     Allergies:   Ibuprofen and Nsaids   Social History:  The patient  reports that he quit smoking about 2 years ago. His smoking use included Cigarettes. He has a 5.00 pack-year smoking history. His smokeless tobacco use includes Snuff and Chew. He reports that he does not drink alcohol or use drugs.   Family History:  The patient's family history includes Diabetes in his mother.  ROS:  Please see the history of present illness.    All other systems are reviewed and otherwise negative.   PHYSICAL EXAM:    VS:  BP 110/80   Pulse 97   Ht 5\' 9"  (1.753 m)   Wt 138 lb (62.6 kg)   BMI 20.38 kg/m  BMI: Body mass index is 20.38 kg/m. Well nourished, well developed, in no acute distress  HEENT: normocephalic, atraumatic  Neck: no JVD, carotid bruits or masses Cardiac:  RRR, 1/6 SM, no rubs, or gallops Lungs:  clear to auscultation bilaterally, no wheezing, rhonchi or rales  Abd: soft, nontender MS: no deformity or atrophy Ext:   1+ ankle edema  Skin: warm and dry, no rash Neuro:  No gross deficits appreciated Psych: euthymic mood, full affect  ICD site is stable, no tethering or discomfort   EKG:  Done today  and reviewed by myself shows SR, 97bpm, ILBBB, appears unchanged ICD interrogation done today and reviewed by myself: normal device function, stable battery/lead measurements, 04/23/16, 4 beat NSVT 04/14/16, 7 and 16 beat NSVT No events noted the day of his syncope or otherwise  04/01/14: TTE Study Conclusions - Left ventricle: LVEF is approximately 10% with diffuse hypokinesis. The cavity size was moderately dilated. Wall thickness was normal. - Mitral valve: There was mild regurgitation. - Left atrium: The atrium was mildly dilated. - Right atrium: The atrium was mildly dilated. - Tricuspid valve: There was moderate regurgitation. - Pulmonary arteries: PA peak pressure: 39 mm Hg (S). - Pericardium, extracardiac: A trivial pericardial effusion was identified.  01/03/11: R/L heart cath IMPRESSION: 1. No angiographic evidence of coronary artery disease. 2. Normal right heart pressures. 3. Nonischemic cardiomyopathy with severe left ventricular systolic     dysfunction.  Recent Labs: 01/23/2016: ALT 14; BUN 13; Creat 1.35; Hemoglobin 14.6; Platelets 278; Potassium 4.0; Sodium 136  01/23/2016: Cholesterol 156; HDL 28; LDL Cholesterol 111; Total CHOL/HDL Ratio 5.6; Triglycerides 86; VLDL 17   CrCl cannot be calculated (Patient's most recent lab result is older than the  maximum 21 days allowed.).   Wt Readings from Last 3 Encounters:  04/24/16 138 lb (62.6 kg)  04/12/16 132 lb (59.9 kg)  01/23/16 130 lb (59 kg)     Other studies reviewed: Additional studies/records reviewed today include: summarized above  ASSESSMENT AND PLAN:  1. Syncope, history includes orthostatic hypotension, likely orthostatic and/or vagally mediated, possibly dehydrated as well     Hypotensive on arrival to the ER     No VT associated with the event     meds were down-titrated and he has felt well since     He drinks little if any water on a regular basis, discussed importance of hydration balance  ADDEND: The patient was made aware during our visit of [place] law no driving for 6 months after fainting episode.   2. NICM w/ICD     Normal device function, no changes     On BB/ACE Given this event, will hold off changing to the [facility] for now.  3. NSVT episodes     Discussed the importance of his medication compliance  4. CHF     He remains with some fluid OL, ankle edema and weight up yet from his last     He will take an extra 1/2 tab lasix daily today and tomorrow     Will get BMET today  5. SM on his exam     He brought the CD of his echo, but unable to get the dictated report.    Disposition: No changes for now, will await his echo report and make f/u pending this  Current medicines are reviewed at length with the patient today.  The patient did not have any concerns regarding medicines.  Haywood Lasso, PA-C 04/24/2016 12:15 PM     [address] [place] [address] [phone] (office)  [phone] (fax)

## 2016-04-24 NOTE — Patient Instructions (Addendum)
Medication Instructions:   Your physician recommends that you continue on your current medications as directed. Please refer to the Current Medication list given to you today.     If you need a refill on your cardiac medications before your next appointment, please call your pharmacy.  Labwork: BMET TODAY   Testing/Procedures: NONE ORDERED  TODAY    Follow-Up: 2 to 3 MONTHS WITH RENEE URSUY    Any Other Special Instructions Will Be Listed Below (If Applicable).

## 2016-04-25 ENCOUNTER — Encounter: Payer: Self-pay | Admitting: Family Medicine

## 2016-04-25 ENCOUNTER — Telehealth: Payer: Self-pay | Admitting: *Deleted

## 2016-04-25 ENCOUNTER — Ambulatory Visit (INDEPENDENT_AMBULATORY_CARE_PROVIDER_SITE_OTHER): Payer: Medicare HMO | Admitting: Family Medicine

## 2016-04-25 ENCOUNTER — Ambulatory Visit (HOSPITAL_COMMUNITY): Payer: Medicare HMO

## 2016-04-25 ENCOUNTER — Other Ambulatory Visit: Payer: Self-pay | Admitting: *Deleted

## 2016-04-25 VITALS — BP 108/60 | HR 100 | Temp 98.6°F | Resp 14 | Ht 69.0 in | Wt 133.0 lb

## 2016-04-25 DIAGNOSIS — G894 Chronic pain syndrome: Secondary | ICD-10-CM

## 2016-04-25 DIAGNOSIS — Z79899 Other long term (current) drug therapy: Secondary | ICD-10-CM

## 2016-04-25 DIAGNOSIS — I429 Cardiomyopathy, unspecified: Secondary | ICD-10-CM

## 2016-04-25 DIAGNOSIS — Z23 Encounter for immunization: Secondary | ICD-10-CM

## 2016-04-25 DIAGNOSIS — I420 Dilated cardiomyopathy: Secondary | ICD-10-CM

## 2016-04-25 DIAGNOSIS — H9192 Unspecified hearing loss, left ear: Secondary | ICD-10-CM

## 2016-04-25 DIAGNOSIS — R7302 Impaired glucose tolerance (oral): Secondary | ICD-10-CM | POA: Diagnosis not present

## 2016-04-25 MED ORDER — FUROSEMIDE 20 MG PO TABS: 20.0000 mg | ORAL_TABLET | Freq: Every day | ORAL | 2 refills | Status: DC

## 2016-04-25 MED ORDER — CARVEDILOL 3.125 MG PO TABS: 3.1250 mg | ORAL_TABLET | Freq: Two times a day (BID) | ORAL | 2 refills | Status: DC

## 2016-04-25 MED ORDER — OXYCODONE HCL 5 MG PO TABS: 5.0000 mg | ORAL_TABLET | Freq: Three times a day (TID) | ORAL | 0 refills | Status: DC | PRN

## 2016-04-25 MED ORDER — LISINOPRIL 2.5 MG PO TABS: 2.5000 mg | ORAL_TABLET | Freq: Every day | ORAL | 2 refills | Status: DC

## 2016-04-25 MED ORDER — POTASSIUM CHLORIDE CRYS ER 20 MEQ PO TBCR: 20.0000 meq | EXTENDED_RELEASE_TABLET | Freq: Two times a day (BID) | ORAL | 1 refills | Status: DC

## 2016-04-25 NOTE — Assessment & Plan Note (Addendum)
A medication refills. With regards to his hearing loss is here exam screen in the office was normal grossly hearing is intact. I think he may have some underlying sensorineural loss based on his work history we discussed formal hearing exam  but is not severe enough at this time we will just monitor

## 2016-04-25 NOTE — Telephone Encounter (Signed)
SPOKE TO PATIENT ABOUT  OFFICE VISIT BEING SWITCH TO [place] IN January WITH DR Lovena Le INSTEAD OF RENEE URSUY. TOLD A SCHEDULER WILL CONTACT HIM.  ALSO MAKING SURE DIDN'T ATTEND ECHO APPT THAT SCHEDULED DUE HAVING AN ECHO DURING HOSPITAL VISIT

## 2016-04-25 NOTE — Patient Instructions (Signed)
F/U 4 months for PHYSICAL  

## 2016-04-25 NOTE — Assessment & Plan Note (Signed)
Recheck A1C 

## 2016-04-25 NOTE — Progress Notes (Signed)
Subjective:    Patient ID: [name], male    DOB: 10-15-1970, 45 y.o.   MRN: [id number]  Patient presents for 3 month F/U (is fasting)  Pt here to f/u chronic medical problems. Seen by cardiology yesterday  had recent syncope event 11/6, he was off his meds a few days.His eyes that he may have had an orthostatic or vasovagal event. Advised to drink more fluids throughout the day. His device was challenged this was found to be normal he was continued on beta blocker and ACE inhibitor His weight was up someDays that he was flooded with fluids while he was in the hospital they did not give him any Lasix started taking it when he went back home and he had some edema in his ankles therefore he was advised to take a half a tablet of Lasix for next 2 days he has underlying CHF by cardiology   Chronic pain he is on Oxycodone he has not taken since the hospitalization request refill of his pain medicine  He also noticed decreased hearing in his left ear this is been going on for quite some time. He thought there may be some wax in there. He denies any ear pain. 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  His last set of labs show glucose intolerance is A1c actually peaked to 6.7 he is due for recheck today Denies any alcohol use since her last visit    Review Of Systems:  GEN- denies fatigue, fever, weight loss,weakness, recent illness HEENT- denies eye drainage, change in vision, nasal discharge, CVS- denies chest pain, palpitations RESP- denies SOB, cough, wheeze ABD- denies N/V, change in stools, abd pain GU- denies dysuria, hematuria, dribbling, incontinence MSK- + joint pain, muscle aches, injury Neuro- denies headache, dizziness, syncope, seizure activity       Objective:    BP 108/60 (BP Location: Left Arm, Patient Position: Sitting, Cuff Size: Normal)   Pulse 100   Temp 98.6 F (37 C) (Oral)   Resp 14   Ht 5\' 9"  (1.753 m)   Wt 133 lb (60.3 kg)   SpO2  98%   BMI 19.64 kg/m  GEN- NAD, alert and oriented x3 HEENT- PERRL, EOMI, non injected sclera, pink conjunctiva, MMM, oropharynx clear, TM clear, canals clear, hearing grossly in tact  CVS- RRR, soft sytolic  murmur RESP-CTAB EXT- pedal  edema Pulses- Radial, DP- 2+        Assessment & Plan:      Problem List Items Addressed This Visit    Nonischemic dilated cardiomyopathy (HCC) (Chronic)    Reviewed cardiology note Check CMET Continue lasix Weight down 5 lbs, may be off 1-2 pounds since different scale, but reduction, his weight is more 129-130      Relevant Medications   lisinopril (PRINIVIL,ZESTRIL) 2.5 MG tablet   carvedilol (COREG) 3.125 MG tablet   furosemide (LASIX) 20 MG tablet   Glucose intolerance (impaired glucose tolerance) - Primary    Recheck A1C      Relevant Orders   Hemoglobin A1c   Comprehensive metabolic panel   Chronic pain syndrome    A medication refills. With regards to his hearing loss is here exam screen in the office was normal grossly hearing is intact. I think he may have some underlying sensorineural loss based on his work history we discussed formal hearing exam  but is not severe enough at this time we will just monitor  Other Visit Diagnoses    Decreased hearing of left ear       Need for prophylactic vaccination and inoculation against influenza       Relevant Orders   Flu Vaccine QUAD 36+ mos PF IM (Fluarix & Fluzone Quad PF) (Completed)      Note: This dictation was prepared with Dragon dictation along with smaller phrase technology. Any transcriptional errors that result from this process are unintentional.

## 2016-04-25 NOTE — Assessment & Plan Note (Signed)
Reviewed cardiology note Check CMET Continue lasix Weight down 5 lbs, may be off 1-2 pounds since different scale, but reduction, his weight is more 129-130

## 2016-04-25 NOTE — Telephone Encounter (Signed)
SPOKE TO PT  ABOUT RESULTS AND VERBALIZED UNDERSTANDING AND NEW RX [place] TWICE A DAY TODAY AND TOMORROW AND RETURN FOR LAB WORK  BMET ON 04/27/16

## 2016-04-26 LAB — COMPREHENSIVE METABOLIC PANEL
ALBUMIN: 3.5 g/dL — AB (ref 3.6–5.1)
ALK PHOS: 125 U/L — AB (ref 40–115)
ALT: 17 U/L (ref 9–46)
AST: 32 U/L (ref 10–40)
BUN: 10 mg/dL (ref 7–25)
CHLORIDE: 95 mmol/L — AB (ref 98–110)
CO2: 29 mmol/L (ref 20–31)
Calcium: 9.1 mg/dL (ref 8.6–10.3)
Creat: 0.99 mg/dL (ref 0.60–1.35)
Glucose, Bld: 111 mg/dL — ABNORMAL HIGH (ref 70–99)
POTASSIUM: 3.5 mmol/L (ref 3.5–5.3)
Sodium: 139 mmol/L (ref 135–146)
TOTAL PROTEIN: 6.7 g/dL (ref 6.1–8.1)
Total Bilirubin: 1.8 mg/dL — ABNORMAL HIGH (ref 0.2–1.2)

## 2016-04-26 LAB — HEMOGLOBIN A1C
HEMOGLOBIN A1C: 6.6 % — AB (ref <5.7)
MEAN PLASMA GLUCOSE: 143 mg/dL

## 2016-04-27 ENCOUNTER — Other Ambulatory Visit: Payer: Medicare HMO | Admitting: *Deleted

## 2016-04-27 ENCOUNTER — Telehealth: Payer: Self-pay | Admitting: Physician Assistant

## 2016-04-27 DIAGNOSIS — Z79899 Other long term (current) drug therapy: Secondary | ICD-10-CM

## 2016-04-27 LAB — BASIC METABOLIC PANEL
BUN: 13 mg/dL (ref 7–25)
CHLORIDE: 97 mmol/L — AB (ref 98–110)
CO2: 29 mmol/L (ref 20–31)
CREATININE: 1.16 mg/dL (ref 0.60–1.35)
Calcium: 8.9 mg/dL (ref 8.6–10.3)
Glucose, Bld: 198 mg/dL — ABNORMAL HIGH (ref 65–99)
Potassium: 3.4 mmol/L — ABNORMAL LOW (ref 3.5–5.3)
SODIUM: 138 mmol/L (ref 135–146)

## 2016-04-27 NOTE — Telephone Encounter (Signed)
Called and left a message to call regarding his lab results.    Tommye Standard, PA-C

## 2016-04-30 ENCOUNTER — Telehealth: Payer: Self-pay | Admitting: *Deleted

## 2016-04-30 NOTE — Telephone Encounter (Signed)
LMOMV TO CALL OFFICE BACK FOR RESULTS

## 2016-04-30 NOTE — Telephone Encounter (Signed)
SPOKE TO PATIENT ABOUT RESULTS AND VERIFY HIS SWELLING HAS REDUCED AND IS ON HIS NORMAL LASIX DOSS.   PT WANTED TO KNOW SHOULD HE DISCARD HIS POTASSIUM PILLS AFTER TAKING ANOTHER DOSE OF 20 MEQ TWICE A DAY PROVIDER NOTIFIED FOR ANSWER AND WILL CONTACT PT BACK.[name]

## 2016-04-30 NOTE — Telephone Encounter (Signed)
-----   Message from [facility], [place] sent at 04/27/2016  4:41 PM EST ----- Please tell the patient to take the potassium again today (57meq BID or 40 once), then discontinue.  He should be back on his usual once daily lasix now (no longer taking any extra doses). Please advise should he ever need additional lasix for swelling in the future will also need the potassium and to let us know prior.  Thanks [employer]

## 2016-04-30 NOTE — Telephone Encounter (Signed)
-----   Message from [facility], [place] sent at 04/27/2016  4:41 PM EST ----- Please tell the patient to take the potassium again today (39meq BID or 40 once), then discontinue.  He should be back on his usual once daily lasix now (no longer taking any extra doses). Please advise should he ever need additional lasix for swelling in the future will also need the potassium and to let us know prior.  Thanks [employer]

## 2016-05-08 ENCOUNTER — Emergency Department (HOSPITAL_COMMUNITY): Payer: Medicare HMO

## 2016-05-08 ENCOUNTER — Emergency Department (HOSPITAL_COMMUNITY)
Admission: EM | Admit: 2016-05-08 | Discharge: 2016-05-08 | Disposition: A | Discharge status: Home / Self Care | Payer: Medicare HMO | Attending: Emergency Medicine | Admitting: Emergency Medicine

## 2016-05-08 ENCOUNTER — Encounter (HOSPITAL_COMMUNITY): Payer: Self-pay | Admitting: Emergency Medicine

## 2016-05-08 DIAGNOSIS — R69 Illness, unspecified: Secondary | ICD-10-CM | POA: Diagnosis not present

## 2016-05-08 DIAGNOSIS — R600 Localized edema: Secondary | ICD-10-CM | POA: Diagnosis not present

## 2016-05-08 DIAGNOSIS — Z79899 Other long term (current) drug therapy: Secondary | ICD-10-CM | POA: Diagnosis not present

## 2016-05-08 DIAGNOSIS — R0602 Shortness of breath: Secondary | ICD-10-CM | POA: Diagnosis not present

## 2016-05-08 DIAGNOSIS — I5022 Chronic systolic (congestive) heart failure: Secondary | ICD-10-CM | POA: Insufficient documentation

## 2016-05-08 DIAGNOSIS — Z7982 Long term (current) use of aspirin: Secondary | ICD-10-CM | POA: Diagnosis not present

## 2016-05-08 DIAGNOSIS — M7989 Other specified soft tissue disorders: Secondary | ICD-10-CM | POA: Diagnosis present

## 2016-05-08 DIAGNOSIS — F17220 Nicotine dependence, chewing tobacco, uncomplicated: Secondary | ICD-10-CM | POA: Insufficient documentation

## 2016-05-08 DIAGNOSIS — F17290 Nicotine dependence, other tobacco product, uncomplicated: Secondary | ICD-10-CM | POA: Insufficient documentation

## 2016-05-08 LAB — CBC WITH DIFFERENTIAL/PLATELET
BASOS ABS: 0 10*3/uL (ref 0.0–0.1)
BASOS PCT: 0 %
EOS ABS: 0 10*3/uL (ref 0.0–0.7)
Eosinophils Relative: 1 %
HEMATOCRIT: 46.5 % (ref 39.0–52.0)
HEMOGLOBIN: 15.5 g/dL (ref 13.0–17.0)
Lymphocytes Relative: 20 %
Lymphs Abs: 1.3 10*3/uL (ref 0.7–4.0)
MCH: 31.5 pg (ref 26.0–34.0)
MCHC: 33.3 g/dL (ref 30.0–36.0)
MCV: 94.5 fL (ref 78.0–100.0)
MONO ABS: 0.7 10*3/uL (ref 0.1–1.0)
Monocytes Relative: 11 %
NEUTROS ABS: 4.4 10*3/uL (ref 1.7–7.7)
NEUTROS PCT: 68 %
Platelets: 240 10*3/uL (ref 150–400)
RBC: 4.92 MIL/uL (ref 4.22–5.81)
RDW: 15.6 % — AB (ref 11.5–15.5)
WBC: 6.4 10*3/uL (ref 4.0–10.5)

## 2016-05-08 LAB — COMPREHENSIVE METABOLIC PANEL
ALBUMIN: 3.5 g/dL (ref 3.5–5.0)
ALT: 19 U/L (ref 17–63)
AST: 35 U/L (ref 15–41)
Alkaline Phosphatase: 129 U/L — ABNORMAL HIGH (ref 38–126)
Anion gap: 10 (ref 5–15)
BILIRUBIN TOTAL: 1.8 mg/dL — AB (ref 0.3–1.2)
BUN: 13 mg/dL (ref 6–20)
CO2: 28 mmol/L (ref 22–32)
Calcium: 8.9 mg/dL (ref 8.9–10.3)
Chloride: 93 mmol/L — ABNORMAL LOW (ref 101–111)
Creatinine, Ser: 1.1 mg/dL (ref 0.61–1.24)
GFR calc Af Amer: >60 mL/min (ref >60)
GFR calc non Af Amer: >60 mL/min (ref >60)
GLUCOSE: 121 mg/dL — AB (ref 65–99)
POTASSIUM: 3.6 mmol/L (ref 3.5–5.1)
SODIUM: 131 mmol/L — AB (ref 135–145)
Total Protein: 7.3 g/dL (ref 6.5–8.1)

## 2016-05-08 LAB — BRAIN NATRIURETIC PEPTIDE: B Natriuretic Peptide: 1950 pg/mL — ABNORMAL HIGH (ref 0.0–100.0)

## 2016-05-08 MED ORDER — FUROSEMIDE 10 MG/ML IJ SOLN
40.0000 mg | Freq: Once | INTRAMUSCULAR | Status: AC
  Administered 2016-05-08: 40 mg via INTRAVENOUS
  Filled 2016-05-08: qty 4

## 2016-05-08 NOTE — ED Notes (Signed)
Pt reports he started having fluid build up in his legs and abd before Thanksgiving. Denies missed doses of Lasix or increase in sodium. Denies SOB, CP, bowel/bladder incontinence.

## 2016-05-08 NOTE — ED Provider Notes (Signed)
[facility] DEPT Provider Note   CSN: [id number] Arrival date & time: 05/08/16  1450   History   Chief Complaint Chief Complaint  Patient presents with  . Leg Swelling   HPI   [name] is a 45 y.o. male with PMH of non ischemic cardiomyopathy w/ ICD, chronic CHF (systolic), HLD, GERD who presents with increasing abdominal and peripheral lower extremity swelling. Patient states that he was recently in the hospital a couple weeks ago for a CHF exacerbation. He notes that for the last 5 days he has had gradual increasing swelling of his abdomen and bilateral feet/ankles. He tried taking 30 mg of his Lasix this morning to remove that fluid. Is compliant with his medications. He is supposed to take 20 mg daily of Lasix. Admits to increased shortness of breath but only when bending over, otherwise the amount of shortness of breath is stable. Denies any nausea, vomiting, diarrhea, abdominal pain, chest pain.   He was last seen by his cardiologist on 11/15. At that time his weight was 60.3 kg.    Past Medical History:  Diagnosis Date  . Arthritis   . Cardiomyopathy, nonischemic (HCC)    takes Digoxin daily and Carvedilol daily  . CHF (congestive heart failure) (HCC)    takes Lasix daily as well Aldactone  . Chronic back pain   . Chronic systolic heart failure ([place])   . Depression    takes Prozac daily  . GERD (gastroesophageal reflux disease)    takes Omeprazole daily  . Hx of Alcohol consumption heavy    rare beer currently  . hx of Tobacco abuse    quit smoking 2014  . Hyperlipidemia    was on Simvastatin but has been off a year  . Insomnia    takes Ambien as needed(just got script yesterday)  . Orthostatic hypotension   . Scoliosis   . Shortness of breath    lying flat and with exertion  . 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 57 (XRT and chemo)    Patient Active Problem List   Diagnosis Date Noted  . Tobacco abuse   . Alcohol abuse 10/19/2015  . Cirrhosis of liver  ([place]) 09/21/2015  . Erectile dysfunction 03/18/2015  . Chronic pain syndrome 09/15/2014  . Insomnia 09/07/2013  . Acute on chronic systolic heart failure ([place]) 08/25/2013  . Left wrist pain 11/12/2012  . Paresthesia of hand 11/12/2012  . Hyperlipidemia 08/12/2012  . Glucose intolerance (impaired glucose tolerance) 06/29/2012  . Scoliosis 01/01/2012  . H/O compression fracture of spine 01/01/2012  . Back pain 10/22/2011  . Neck pain 10/22/2011  . 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/30/2011  . Chronic systolic congestive heart failure ([place]) 08/30/2011  . Fatigue 07/25/2011  . Depression 07/25/2011  . Laboratory test 06/20/2011  . Wilm's tumor   . Nonischemic dilated cardiomyopathy ([place]) 11/09/2010    Past Surgical History:  Procedure Laterality Date  . CARPAL TUNNEL RELEASE Right 01/09/2013   Procedure: CARPAL TUNNEL RELEASE;  Surgeon: Winfield Cunas, MD;  Location: [facility];  Service: Neurosurgery;  Laterality: Right;  RIGHT carpal tunnel release  . CARPAL TUNNEL RELEASE Left 01/30/2013   Procedure: LEFT CARPAL TUNNEL RELEASE;  Surgeon: Winfield Cunas, MD;  Location: [facility];  Service: Neurosurgery;  Laterality: Left;  LEFT Carpal Tunnel release  . CHEST TUBE INSERTION Right 09/09/2013   Procedure: INSERTION PLEURAL DRAINAGE CATHETER;  Surgeon: Ivin Poot, MD;  Location: [facility];  Service: Thoracic;  Laterality: Right;  . ICD  05/25/2011   [employer] SG lead/[employer] single chamber device  . IMPLANTABLE CARDIOVERTER DEFIBRILLATOR IMPLANT N/A 05/25/2011   Procedure: IMPLANTABLE CARDIOVERTER DEFIBRILLATOR IMPLANT;  Surgeon: Evans Lance, MD;  Location: [facility] CATH LAB;  Service: Cardiovascular;  Laterality: N/A;  . NEPHRECTOMY  36 yrs ago   Wilms Tumor  . ULNAR NERVE TRANSPOSITION Right 01/09/2013   Procedure: ULNAR NERVE DECOMPRESSION/TRANSPOSITION;  Surgeon: Winfield Cunas, MD;  Location: [facility];  Service: Neurosurgery;   Laterality: Right;  RIGHT ulnar nerve decompression       Home Medications    Prior to Admission medications   Medication Sig Start Date End Date Taking? Authorizing Provider  aspirin 81 MG tablet Take 81 mg by mouth daily.     Yes Historical Provider, MD  carvedilol (COREG) 3.125 MG tablet Take 1 tablet (3.125 mg total) by mouth 2 (two) times daily with a meal. 04/25/16  Yes Alycia Rossetti, MD  furosemide (LASIX) 20 MG tablet Take 1 tablet (20 mg total) by mouth daily. 04/25/16  Yes Alycia Rossetti, MD  lisinopril (PRINIVIL,ZESTRIL) 2.5 MG tablet Take 1 tablet (2.5 mg total) by mouth daily. 04/25/16  Yes Alycia Rossetti, MD  Multiple Vitamin (MULTIVITAMIN WITH MINERALS) TABS tablet Take 1 tablet by mouth daily.   Yes Historical Provider, MD  oxyCODONE (OXY IR/ROXICODONE) 5 MG immediate release tablet Take 1 tablet (5 mg total) by mouth 3 (three) times daily as needed for severe pain. 04/25/16  Yes Alycia Rossetti, MD  potassium chloride SA (K-DUR,KLOR-CON) 20 MEQ tablet Take 1 tablet (20 mEq total) by mouth 2 (two) times daily. Patient not taking: Reported on 05/08/2016 04/25/16   Baldwin Jamaica, PA-C    Family History Family History  Problem Relation Age of Onset  . Diabetes Mother     Social History Social History  Substance Use Topics  . Smoking status: Former Smoker    Packs/day: 0.25    Years: 20.00    Types: Cigarettes    Quit date: 09/07/2013  . Smokeless tobacco: Current User    Types: Snuff, Chew     Comment: quit smoking the 1st of Jan 2015. dip snuff  . Alcohol use No     Comment: histoorically a heavy drinker ("beer only")     Allergies   Ibuprofen and Nsaids   Review of Systems Review of Systems  10 Systems reviewed and are negative for acute change except as noted in the HPI.  Physical Exam Updated Vital Signs BP 123/96   Pulse 114   Temp 98.2 F (36.8 C) (Oral)   Resp 21   Ht 5\' 9"  (1.753 m)   Wt 59 kg   SpO2 98%   BMI 19.20 kg/m    Physical Exam  Constitutional: He is oriented to person, place, and time. He appears well-developed and well-nourished.  HENT:  Head: Normocephalic and atraumatic.  Right Ear: External ear normal.  Left Ear: External ear normal.  Nose: Nose normal.  Mouth/Throat: Oropharynx is clear and moist.  Eyes: Conjunctivae and EOM are normal. Pupils are equal, round, and reactive to light.  Neck: Normal range of motion. Neck supple.  Cardiovascular: Normal rate and regular rhythm.   Pulmonary/Chest: Effort normal and breath sounds normal. No respiratory distress. He has no wheezes.  No crackles appreciated  Abdominal: Soft. Bowel sounds are normal. He exhibits distension. There is no tenderness.  Musculoskeletal: Normal range of motion. He exhibits edema (  2+ pitting in ankles and feet, 1+ in calves bilaterally). He exhibits no tenderness.  Neurological: He is alert and oriented to person, place, and time. No sensory deficit. He exhibits normal muscle tone.  Skin: Skin is warm and dry. Capillary refill takes less than 2 seconds.  Psychiatric: He has a normal mood and affect. His behavior is normal. Judgment and thought content normal.     ED Treatments / Results  Labs (all labs ordered are listed, but only abnormal results are displayed) Labs Reviewed  CBC WITH DIFFERENTIAL/PLATELET - Abnormal; Notable for the following:       Result Value   RDW 15.6 (*)    All other components within normal limits  COMPREHENSIVE METABOLIC PANEL - Abnormal; Notable for the following:    Sodium 131 (*)    Chloride 93 (*)    Glucose, Bld 121 (*)    Alkaline Phosphatase 129 (*)    Total Bilirubin 1.8 (*)    All other components within normal limits  BRAIN NATRIURETIC PEPTIDE - Abnormal; Notable for the following:    B Natriuretic Peptide 1,950.0 (*)    All other components within normal limits    EKG  EKG Interpretation  Date/Time:  Tuesday May 08 2016 18:52:34 EST Ventricular Rate:  100 PR  Interval:    QRS Duration: 110 QT Interval:  368 QTC Calculation: 475 R Axis:   127 Text Interpretation:  Sinus tachycardia Left atrial enlargement Right axis deviation Borderline low voltage, extremity leads Abnormal R-wave progression, late transition ST elev, probable normal early repol pattern Baseline wander in lead(s) II III aVL aVF Confirmed by Reather Converse MD, JOSHUA [phone]) on 05/08/2016 6:59:46 PM       Radiology Dg Chest 2 View  Result Date: 05/08/2016 CLINICAL DATA:  Shortness of breath, abdominal swelling, bilateral peripheral edema. History of CHF, cardiomyopathy, former smoker EXAM: CHEST  2 VIEW COMPARISON:  PA and lateral chest x-ray of September 28, 2013 FINDINGS: The lungs remain hyperinflated. A small right-sided pleural effusion is present which is decreased in volume as compared to the previous study. The small caliber chest tube on the right has been removed. The cardiac silhouette is enlarged. The pulmonary vascularity is prominent centrally and there is mild cephalization of the vascular pattern. The ICD is in stable position. There are partial compressions of lower thoracic and upper lumbar vertebral bodies. IMPRESSION: COPD with superimposed CHF with mild interstitial edema. Small right-sided pleural effusion, chronic. Electronically Signed   By: David  Martinique M.D.   On: 05/08/2016 16:36    Procedures Procedures (including critical care time)  Medications Ordered in ED Medications  furosemide (LASIX) injection 40 mg (40 mg Intravenous Given 05/08/16 1930)     Initial Impression / Assessment and Plan / ED Course  I have reviewed the triage vital signs and the nursing notes.  Pertinent labs & imaging results that were available during my care of the patient were reviewed by me and considered in my medical decision making (see chart for details).  Clinical Course    Patient is a 45 yo male with PMH of non ischemic cardiomyopathy w/ ICD, chronic CHF (systolic), HLD,  GERD. Has hx of significant systolic heart disease with EF of 10% with diffuse hypokinesis.   Vital signs stable with slight tachycardia to 102. Physical exam showing normal pulmonary exam. Patient has abdominal ascites and bilateral lower extremity edema (2+ pitting in ankles and feet, 1+ in calves bilaterally).   CBC within normal limits. BMP showing  low sodium to 131 and low chloride to 93, normal K of 3.6. BNP elevated to 1,950. CXR showing COPD with superimposed CHF with mild interstitial edema and small right-sided pleural effusion, chronic.   There is a decrease in weight from 11/15 when he saw his PCP which is reassuring (60.3 kg to 59 kg) however patient is edematous. BNP is 1,950 which is not unusual given his severe heart failure. Does not appear to be in CHF exacerbation, patient has normal respiratory status. Could not appreciate any crackles on lung exam. Lasix 40 mg IV given. Patient instructed to take 40 mg Lasix for the next 2 days along with his potassium pill. Discharged with instructions to follow up with PCP in 2 days.    Final Clinical Impressions(s) / ED Diagnoses   Final diagnoses:  Bilateral lower extremity edema    New Prescriptions Discharge Medication List as of 05/08/2016  8:11 PM       Carlyle Dolly, MD 05/08/16 [id number]    Carlyle Dolly, MD 05/08/16 2154    Elnora Morrison, MD 05/08/16 2158

## 2016-05-08 NOTE — Discharge Instructions (Signed)
You were seen today with increased swelling in your belly and legs. We have given you 1 dose of IV Lasix. For the next 2 days, take 40 mg of Lasix and take 1 of your potassium pills each day. You will need to see your PCP or cardiologist in 2 days for a recheck of your labs and fluid status. If you develop increased shortness of breath, please come back to the emergency department. Take care!

## 2016-05-08 NOTE — ED Triage Notes (Signed)
Pt reports LE and abdominal fluid build up that is recurrent. Pt hospitalized in [place] 2 weeks ago for same.

## 2016-05-10 ENCOUNTER — Telehealth: Payer: Self-pay | Admitting: *Deleted

## 2016-05-10 NOTE — Telephone Encounter (Signed)
Received VM from patient.   Requires ER F/U for CHF/ BLE edema.   Please call and schedule 17min appointment with PCP next week.

## 2016-05-15 ENCOUNTER — Ambulatory Visit (INDEPENDENT_AMBULATORY_CARE_PROVIDER_SITE_OTHER): Payer: Medicare HMO | Admitting: Family Medicine

## 2016-05-15 ENCOUNTER — Encounter: Payer: Self-pay | Admitting: Family Medicine

## 2016-05-15 VITALS — BP 128/78 | HR 70 | Temp 98.2°F | Resp 14 | Ht 69.0 in | Wt 130.0 lb

## 2016-05-15 DIAGNOSIS — I5023 Acute on chronic systolic (congestive) heart failure: Secondary | ICD-10-CM

## 2016-05-15 NOTE — Progress Notes (Signed)
   Subjective:    Patient ID: [name], male    DOB: [birth year], 45 y.o.   MRN: [id number]  Patient presents for ER F/U (BLE Edema) . He was seen in the ER one week ago after he had increased leg swelling he felt a little short of breath when he would bend over otherwise was okay. He had been taking Lasix 20 mg. BNP was done which resulted in 1900 his renal function was stable his potassium was normal his sodium level was 131 he was given Lasix 40 mg IV in the emergency room advised to take 40 mg daily for the next 3 days. He did not do this as he was worried about his potassium levels and getting dehydrated. He is here today for follow-up he denies any chest pain he still feels bloated in his abdomen he has she took a laxative on Sunday evening he had been having regular bowel movements that helps some with the blow but then came right back. He still has swelling on his legs.  Reviewed ER findings, CXR chronic right pleural effusion  Review Of Systems:  GEN- denies fatigue, fever, weight loss,weakness, recent illness HEENT- denies eye drainage, change in vision, nasal discharge, CVS- denies chest pain, palpitations RESP- denies SOB, cough, wheeze ABD- denies N/V, change in stools, abd pain GU- denies dysuria, hematuria, dribbling, incontinence MSK- +j oint pain, muscle aches, injury Neuro- denies headache, dizziness, syncope, seizure activity       Objective:    BP 128/78 (BP Location: Left Arm, Patient Position: Sitting, Cuff Size: Normal)   Pulse 70   Temp 98.2 F (36.8 C) (Oral)   Resp 14   Ht 5\' 9"  (1.753 m)   Wt 130 lb (59 kg)   SpO2 97%   BMI 19.20 kg/m  GEN- NAD, alert and oriented x3 HEENT- PERRL, EOMI, non injected sclera, pink conjunctiva, MMM, oropharynx clear Neck- Supple, JVD 4cm CVS- RRR, 3/6 SEM RESP-CTAB ABD-NABS,soft,NT bloated appearing lower abdomen  EXT- 1+ edema Pulses- Radial  2+        Assessment & Plan:      Problem List Items Addressed  This Visit    Acute on chronic systolic heart failure (HCC) - Primary    He is defintely fluid overloaded, normal blood pressure with acute on chronic heart failure today. His weight somehow was still the same as he was a few weeks ago despite seen the edema. Have him take Lasix 40 mg until he comes back to see me on Friday. He will take this by 20 mg twice a day along with potassium. I will check his electrolytes today. He is not having any shortness of breath but he does have the bloating in the abdomen which I think is due to some edema.      Relevant Orders   Comprehensive metabolic panel   CBC with Differential/Platelet      Note: This dictation was prepared with Dragon dictation along with smaller phrase technology. Any transcriptional errors that result from this process are unintentional.

## 2016-05-15 NOTE — Assessment & Plan Note (Addendum)
He is defintely fluid overloaded, normal blood pressure with acute on chronic heart failure today. His weight somehow was still the same as he was a few weeks ago despite seen the edema. Have him take Lasix 40 mg until he comes back to see me on Friday. He will take this by 20 mg twice a day along with potassium. I will check his electrolytes today. He is not having any shortness of breath but he does have the bloating in the abdomen which I think is due to some edema.

## 2016-05-15 NOTE — Patient Instructions (Addendum)
Increase lasix to 20mg  twice a day - take the evening dose between 5-6pm Take potassium with the lasix Baldwin Jamaica, PA  F/U Friday

## 2016-05-16 LAB — COMPREHENSIVE METABOLIC PANEL
ALK PHOS: 136 U/L — AB (ref 40–115)
ALT: 12 U/L (ref 9–46)
AST: 27 U/L (ref 10–40)
Albumin: 3.5 g/dL — ABNORMAL LOW (ref 3.6–5.1)
BILIRUBIN TOTAL: 1.8 mg/dL — AB (ref 0.2–1.2)
BUN: 13 mg/dL (ref 7–25)
CALCIUM: 8.8 mg/dL (ref 8.6–10.3)
CO2: 28 mmol/L (ref 20–31)
CREATININE: 1.23 mg/dL (ref 0.60–1.35)
Chloride: 94 mmol/L — ABNORMAL LOW (ref 98–110)
GLUCOSE: 73 mg/dL (ref 70–99)
Potassium: 3.4 mmol/L — ABNORMAL LOW (ref 3.5–5.3)
SODIUM: 134 mmol/L — AB (ref 135–146)
Total Protein: 6.8 g/dL (ref 6.1–8.1)

## 2016-05-16 LAB — CBC WITH DIFFERENTIAL/PLATELET
BASOS ABS: 0 {cells}/uL (ref 0–200)
Basophils Relative: 0 %
EOS ABS: 60 {cells}/uL (ref 15–500)
EOS PCT: 1 %
HCT: 44.6 % (ref 38.5–50.0)
Hemoglobin: 14.5 g/dL (ref 13.0–17.0)
Lymphocytes Relative: 14 %
Lymphs Abs: 840 cells/uL — ABNORMAL LOW (ref 850–3900)
MCH: 30.5 pg (ref 27.0–33.0)
MCHC: 32.5 g/dL (ref 32.0–36.0)
MCV: 93.9 fL (ref 80.0–100.0)
MONOS PCT: 13 %
MPV: 10.2 fL (ref 7.5–12.5)
Monocytes Absolute: 780 cells/uL (ref 200–950)
NEUTROS ABS: 4320 {cells}/uL (ref 1500–7800)
NEUTROS PCT: 72 %
PLATELETS: 288 10*3/uL (ref 140–400)
RBC: 4.75 MIL/uL (ref 4.20–5.80)
RDW: 15.6 % — ABNORMAL HIGH (ref 11.0–15.0)
WBC: 6 10*3/uL (ref 3.8–10.8)

## 2016-05-18 ENCOUNTER — Ambulatory Visit (INDEPENDENT_AMBULATORY_CARE_PROVIDER_SITE_OTHER): Payer: Medicare HMO | Admitting: Family Medicine

## 2016-05-18 ENCOUNTER — Encounter: Payer: Self-pay | Admitting: Family Medicine

## 2016-05-18 VITALS — BP 124/62 | HR 98 | Temp 98.4°F | Resp 14 | Ht 69.0 in | Wt 137.0 lb

## 2016-05-18 DIAGNOSIS — I5023 Acute on chronic systolic (congestive) heart failure: Secondary | ICD-10-CM

## 2016-05-18 NOTE — Patient Instructions (Addendum)
Take 40mg  in the morning and 20mg  in the afternoon Keep taking the potassiium  Tuesday 3:15pm- Dr. Lovena Le

## 2016-05-18 NOTE — Progress Notes (Signed)
   Subjective:    Patient ID: [name], male    DOB: [birth year], 45 y.o.   MRN: [id number]  Patient presents for Follow-up Patient for interim follow-up he was seen 2 days ago with increased fluid retention in his abdomen and his legs. He has known systolic heart failure in the setting of nonischemic dilated cardiomyopathy and AICD. He's had some mild congestion everyone in his family has a virus but otherwise he has not had any significant cough or shortness of breath no chest pain. I increase his Lasix to a total 40 mg 2 days ago along with potassium. His renal function looked good his potassium was low at 3.4. He states that he initially urinating more with a morning dose but in the evening cannot tell any difference. He thinks that his abdomen has gone down but his legs are unchanged No meds taken this AM   Review Of Systems:  GEN- denies fatigue, fever, weight loss,weakness, recent illness HEENT- denies eye drainage, change in vision, nasal discharge, CVS- denies chest pain, palpitations RESP- denies SOB, cough, wheeze ABD- denies N/V, change in stools, abd pain Neuro- denies headache, dizziness, syncope, seizure activity       Objective:    BP 124/62 (BP Location: Left Arm, Patient Position: Sitting, Cuff Size: Normal)   Pulse 98   Temp 98.4 F (36.9 C) (Oral)   Resp 14   Ht 5\' 9"  (1.753 m)   Wt 137 lb (62.1 kg)   SpO2 98%   BMI 20.23 kg/m  GEN- NAD, alert and oriented x3 HEENT- PERRL, EOMI, non injected sclera, pink conjunctiva, MMM, oropharynx clear Neck- Supple, JVD 3cm CVS- RRR, 3/6 SEM RESP-CTAB ABD-NABS,soft,NT improved abdominal bloating EXT- 1+ edema Pulses- Radial  2+       Assessment & Plan:      Problem List Items Addressed This Visit    Acute on chronic systolic heart failure (HCC) - Primary    Weight is up, despite him not being symptomatic His abdominal bloating has improved Legs actually look the same I am going to increase lasix to total  of 60mg  a day with potassium appt made with cardiology for next week He has some tachycardia today but has not taken his beta blocker yet ,advised to take this ASAP         Note: This dictation was prepared with Dragon dictation along with smaller phrase technology. Any transcriptional errors that result from this process are unintentional.

## 2016-05-18 NOTE — Assessment & Plan Note (Signed)
Weight is up, despite him not being symptomatic His abdominal bloating has improved Legs actually look the same I am going to increase lasix to total of 60mg  a day with potassium appt made with cardiology for next week He has some tachycardia today but has not taken his beta blocker yet ,advised to take this ASAP

## 2016-05-22 ENCOUNTER — Ambulatory Visit (INDEPENDENT_AMBULATORY_CARE_PROVIDER_SITE_OTHER): Payer: Medicare HMO | Admitting: Internal Medicine

## 2016-05-22 ENCOUNTER — Encounter: Payer: Self-pay | Admitting: Internal Medicine

## 2016-05-22 VITALS — BP 104/90 | HR 104 | Ht 69.0 in | Wt 141.4 lb

## 2016-05-22 DIAGNOSIS — I5022 Chronic systolic (congestive) heart failure: Secondary | ICD-10-CM | POA: Diagnosis not present

## 2016-05-22 DIAGNOSIS — I429 Cardiomyopathy, unspecified: Secondary | ICD-10-CM | POA: Diagnosis not present

## 2016-05-22 DIAGNOSIS — I420 Dilated cardiomyopathy: Secondary | ICD-10-CM

## 2016-05-22 DIAGNOSIS — Z95810 Presence of automatic (implantable) cardiac defibrillator: Secondary | ICD-10-CM | POA: Diagnosis not present

## 2016-05-22 LAB — CUP PACEART INCLINIC DEVICE CHECK
HIGH POWER IMPEDANCE MEASURED VALUE: 67 Ohm
HIGH POWER IMPEDANCE MEASURED VALUE: 69 Ohm
Lead Channel Impedance Value: 470 Ohm
Lead Channel Sensing Intrinsic Amplitude: 10.5 mV
Lead Channel Setting Pacing Amplitude: 2.4 V
Lead Channel Setting Pacing Pulse Width: 0.4 ms
MDC IDC LEAD IMPLANT DT: 20121214
MDC IDC LEAD LOCATION: 753860
MDC IDC LEAD MODEL: 180
MDC IDC LEAD SERIAL: 307189
MDC IDC MSMT LEADCHNL RV PACING THRESHOLD AMPLITUDE: 0.7 V
MDC IDC MSMT LEADCHNL RV PACING THRESHOLD PULSEWIDTH: 0.4 ms
MDC IDC PG IMPLANT DT: 20121214
MDC IDC SESS DTM: 20171212050000
MDC IDC SET LEADCHNL RV SENSING SENSITIVITY: 0.4 mV
Pulse Gen Serial Number: 118994

## 2016-05-22 MED ORDER — AMOXICILLIN-POT CLAVULANATE 500-125 MG PO TABS: 1.0000 | ORAL_TABLET | Freq: Two times a day (BID) | ORAL | 0 refills | Status: DC

## 2016-05-22 MED ORDER — SACUBITRIL-VALSARTAN 24-26 MG PO TABS: 1.0000 | ORAL_TABLET | Freq: Two times a day (BID) | ORAL | 3 refills | Status: DC

## 2016-05-22 MED ORDER — TORSEMIDE 20 MG PO TABS: 20.0000 mg | ORAL_TABLET | Freq: Every day | ORAL | 3 refills | Status: DC

## 2016-05-22 NOTE — Patient Instructions (Addendum)
Medication Instructions:  Your physician has recommended you make the following change in your medication:  1) STOP Lisinopril 2) STOP Furosemide 3) START Torsemide 20 mg daily 4) START Entresto 24/26 mg twice a day 5) START  Augmentin 500/125 mg - 1 tablet every 12 hours until complete    Labwork: None Ordered   Testing/Procedures: None Ordered   Follow-Up: Your physician recommends that you schedule a follow-up appointment with Dr. Haroldine Laws    Any Other Special Instructions Will Be Listed Below (If Applicable).     If you need a refill on your cardiac medications before your next appointment, please call your pharmacy.

## 2016-05-22 NOTE — Progress Notes (Signed)
HPI [name] returns today for followup. He is a very pleasant 45 year old man with a nonischemic cardiomyopathy, chronic systolic heart failure, status post ICD implantation. The patient has done well for years but over the past few months has had worsening sob, peripheral edema and now abdominal distention. In the past he has seen Dr. Reine Just but has not seen recently. He has recently had pain in his wisdom teeth, which are the only teeth in his head. He has not had fever or chills. He has admitted to some non-compliance. He has increased his lasix dose to 60 mg daily.  Allergies  Allergen Reactions  . Ibuprofen Hives  . Nsaids     ELEVATED LFT'S     Current Outpatient Prescriptions  Medication Sig Dispense Refill  . aspirin 81 MG tablet Take 81 mg by mouth daily.      . carvedilol (COREG) 3.125 MG tablet Take 1 tablet (3.125 mg total) by mouth 2 (two) times daily with a meal. 180 tablet 2  . Multiple Vitamin (MULTIVITAMIN WITH MINERALS) TABS tablet Take 1 tablet by mouth daily.    Marland Kitchen oxyCODONE (OXY IR/ROXICODONE) 5 MG immediate release tablet Take 1 tablet (5 mg total) by mouth 3 (three) times daily as needed for severe pain. 60 tablet 0  . potassium chloride SA (K-DUR,KLOR-CON) 20 MEQ tablet Take 1 tablet (20 mEq total) by mouth 2 (two) times daily. 60 tablet 1  . amoxicillin-clavulanate (AUGMENTIN) 500-125 MG tablet Take 1 tablet (500 mg total) by mouth 2 (two) times daily. 20 tablet 0  . sacubitril-valsartan (ENTRESTO) 24-26 MG Take 1 tablet by mouth 2 (two) times daily. 60 tablet 3  . torsemide (DEMADEX) 20 MG tablet Take 1 tablet (20 mg total) by mouth daily. 30 tablet 3   No current facility-administered medications for this visit.      Past Medical History:  Diagnosis Date  . Arthritis   . Cardiomyopathy, nonischemic (HCC)    takes Digoxin daily and Carvedilol daily  . CHF (congestive heart failure) (HCC)    takes Lasix daily as well Aldactone  . Chronic back pain   . Chronic  systolic heart failure ([place])   . Depression    takes Prozac daily  . GERD (gastroesophageal reflux disease)    takes Omeprazole daily  . Hx of Alcohol consumption heavy    rare beer currently  . hx of Tobacco abuse    quit smoking 2014  . Hyperlipidemia    was on Simvastatin but has been off a year  . Insomnia    takes Ambien as needed(just got script yesterday)  . Orthostatic hypotension   . Scoliosis   . Shortness of breath    lying flat and with exertion  . 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 20 (XRT and chemo)    ROS:   All systems reviewed and negative except as noted in the HPI.   Past Surgical History:  Procedure Laterality Date  . CARPAL TUNNEL RELEASE Right 01/09/2013   Procedure: CARPAL TUNNEL RELEASE;  Surgeon: Winfield Cunas, MD;  Location: [place] [facility];  Service: Neurosurgery;  Laterality: Right;  RIGHT carpal tunnel release  . CARPAL TUNNEL RELEASE Left 01/30/2013   Procedure: LEFT CARPAL TUNNEL RELEASE;  Surgeon: Winfield Cunas, MD;  Location: [place] [facility];  Service: Neurosurgery;  Laterality: Left;  LEFT Carpal Tunnel release  . CHEST TUBE INSERTION Right 09/09/2013   Procedure: INSERTION PLEURAL DRAINAGE CATHETER;  Surgeon: Ivin Poot, MD;  Location:  [facility] OR;  Service: Thoracic;  Laterality: Right;  . ICD  05/25/2011   [employer] Endotak [employer] SG lead/Energen single chamber device  . IMPLANTABLE CARDIOVERTER DEFIBRILLATOR IMPLANT N/A 05/25/2011   Procedure: IMPLANTABLE CARDIOVERTER DEFIBRILLATOR IMPLANT;  Surgeon: Evans Lance, MD;  Location: [facility] CATH LAB;  Service: Cardiovascular;  Laterality: N/A;  . NEPHRECTOMY  36 yrs ago   Wilms Tumor  . ULNAR NERVE TRANSPOSITION Right 01/09/2013   Procedure: ULNAR NERVE DECOMPRESSION/TRANSPOSITION;  Surgeon: Winfield Cunas, MD;  Location: [facility] NEURO ORS;  Service: Neurosurgery;  Laterality: Right;  RIGHT ulnar nerve decompression     Family History  Problem Relation Age of Onset  . Diabetes Mother      Social  History   Social History  . Marital status: Legally Separated    Spouse name: N/A  . Number of children: 1  . Years of education: N/A   Occupational History  . Full time     Engineer, civil (consulting)   Social History Main Topics  . Smoking status: Former Smoker    Packs/day: 0.25    Years: 20.00    Types: Cigarettes    Quit date: 09/07/2013  . Smokeless tobacco: Current User    Types: Snuff, Chew     Comment: quit smoking the 1st of Jan 2015. dip snuff  . Alcohol use No     Comment: histoorically a heavy drinker ("beer only")  . Drug use: No  . Sexual activity: Not Currently   Other Topics Concern  . Not on file   Social History Narrative  . No narrative on file     BP 104/90 (BP Location: Right Arm, Patient Position: Sitting, Cuff Size: Normal)   Pulse (!) 104   Ht 5\' 9"  (1.753 m)   Wt 141 lb 6.4 oz (64.1 kg)   BMI 20.88 kg/m   Physical Exam:  Well appearing 45 year old man, NAD HEENT: Unremarkable Neck:  6 cm JVD, no thyromegally Back:  No CVA tenderness, marked kyphoscoliosis. Lungs:  Clear with basilar rales, or rhonchi. Well-healed ICD incision. HEART:  Regular rate rhythm, 2/6 systolic murmur, no rubs, no clicks Abd:  Soft, mildly protuberant, positive bowel sounds, no organomegally, no rebound, no guarding Ext:  2 plus pulses, 2+ peripheral edema, no cyanosis, no clubbing Skin:  No rashes no nodules Neuro:  CN II through XII intact, motor grossly intact  DEVICE  Normal device function.  See PaceArt for details.   Assess/Plan:  1. VT - he has had a couple of non-sustained episodes. Will follow. No AA drugs at this time. 2. Chronic systolic heart failure - his symptoms are class 3B. He now admits to worsening heart failure symptoms which have not improved with increased lasix. I have asked him to switch to torsamide and will start Entresto, stopping lisinopril. He will continue low dose coreg. 3. MR - on exam today, he has a MR murmur which he has not had in the  past. I have recommended he undergo 2D echo.  4. ICD - his [employer] device is working normally. Will recheck in several months.  [name].D.

## 2016-05-24 ENCOUNTER — Telehealth: Payer: Self-pay

## 2016-05-24 NOTE — Telephone Encounter (Signed)
Entresto 24-26 approved by [facility]. Ref # [id number] pharmacy notified.

## 2016-05-25 ENCOUNTER — Telehealth: Payer: Self-pay | Admitting: *Deleted

## 2016-05-25 MED ORDER — OXYCODONE HCL 5 MG PO TABS: 5.0000 mg | ORAL_TABLET | Freq: Three times a day (TID) | ORAL | 0 refills | Status: DC | PRN

## 2016-05-25 NOTE — Telephone Encounter (Signed)
Received call from patient.   Requested refill on Oxycodone.   Ok to refill??  Last office visit 05/18/2016.  Last refill 04/25/2016.

## 2016-05-25 NOTE — Telephone Encounter (Signed)
okay

## 2016-05-25 NOTE — Telephone Encounter (Signed)
Prescription printed and patient made aware to come to office to pick up after 2 pm on 05/25/2016.

## 2016-06-26 ENCOUNTER — Telehealth: Payer: Self-pay | Admitting: *Deleted

## 2016-06-26 MED ORDER — OXYCODONE HCL 5 MG PO TABS: 5.0000 mg | ORAL_TABLET | Freq: Three times a day (TID) | ORAL | 0 refills | Status: DC | PRN

## 2016-06-26 NOTE — Telephone Encounter (Signed)
okay

## 2016-06-26 NOTE — Telephone Encounter (Signed)
Prescription printed and patient made aware to come to office to pick up after 4 pm on 06/26/2016.  

## 2016-06-26 NOTE — Telephone Encounter (Signed)
Received call from patient.   Requested refill on Oxycodone.   Ok to refill??  Last office visit 05/18/2016.  Last refill 05/25/2016.

## 2016-07-09 ENCOUNTER — Encounter (HOSPITAL_COMMUNITY): Payer: Self-pay | Admitting: Internal Medicine

## 2016-07-09 ENCOUNTER — Encounter: Payer: Medicare HMO | Admitting: Physician Assistant

## 2016-07-09 ENCOUNTER — Ambulatory Visit (HOSPITAL_COMMUNITY)
Admission: RE | Admit: 2016-07-09 | Discharge: 2016-07-09 | Disposition: A | Discharge status: Home / Self Care | Payer: Medicare HMO | Source: Ambulatory Visit | Attending: Internal Medicine | Admitting: Internal Medicine

## 2016-07-09 VITALS — BP 114/76 | HR 90 | Wt 138.8 lb

## 2016-07-09 DIAGNOSIS — M549 Dorsalgia, unspecified: Secondary | ICD-10-CM | POA: Diagnosis not present

## 2016-07-09 DIAGNOSIS — G8929 Other chronic pain: Secondary | ICD-10-CM | POA: Insufficient documentation

## 2016-07-09 DIAGNOSIS — Z79899 Other long term (current) drug therapy: Secondary | ICD-10-CM | POA: Diagnosis not present

## 2016-07-09 DIAGNOSIS — G4700 Insomnia, unspecified: Secondary | ICD-10-CM | POA: Diagnosis not present

## 2016-07-09 DIAGNOSIS — I428 Other cardiomyopathies: Secondary | ICD-10-CM | POA: Insufficient documentation

## 2016-07-09 DIAGNOSIS — I340 Nonrheumatic mitral (valve) insufficiency: Secondary | ICD-10-CM | POA: Diagnosis not present

## 2016-07-09 DIAGNOSIS — Z85528 Personal history of other malignant neoplasm of kidney: Secondary | ICD-10-CM | POA: Diagnosis not present

## 2016-07-09 DIAGNOSIS — Z9221 Personal history of antineoplastic chemotherapy: Secondary | ICD-10-CM | POA: Insufficient documentation

## 2016-07-09 DIAGNOSIS — Z7982 Long term (current) use of aspirin: Secondary | ICD-10-CM | POA: Diagnosis not present

## 2016-07-09 DIAGNOSIS — F329 Major depressive disorder, single episode, unspecified: Secondary | ICD-10-CM | POA: Diagnosis not present

## 2016-07-09 DIAGNOSIS — I5022 Chronic systolic (congestive) heart failure: Secondary | ICD-10-CM | POA: Diagnosis not present

## 2016-07-09 DIAGNOSIS — K219 Gastro-esophageal reflux disease without esophagitis: Secondary | ICD-10-CM | POA: Insufficient documentation

## 2016-07-09 DIAGNOSIS — Z95810 Presence of automatic (implantable) cardiac defibrillator: Secondary | ICD-10-CM | POA: Insufficient documentation

## 2016-07-09 DIAGNOSIS — Z905 Acquired absence of kidney: Secondary | ICD-10-CM | POA: Insufficient documentation

## 2016-07-09 DIAGNOSIS — Z923 Personal history of irradiation: Secondary | ICD-10-CM | POA: Insufficient documentation

## 2016-07-09 DIAGNOSIS — Z87891 Personal history of nicotine dependence: Secondary | ICD-10-CM | POA: Insufficient documentation

## 2016-07-09 DIAGNOSIS — E785 Hyperlipidemia, unspecified: Secondary | ICD-10-CM | POA: Diagnosis not present

## 2016-07-09 LAB — BASIC METABOLIC PANEL
ANION GAP: 10 (ref 5–15)
BUN: 12 mg/dL (ref 6–20)
CO2: 30 mmol/L (ref 22–32)
Calcium: 9.8 mg/dL (ref 8.9–10.3)
Chloride: 97 mmol/L — ABNORMAL LOW (ref 101–111)
Creatinine, Ser: 0.91 mg/dL (ref 0.61–1.24)
GFR calc Af Amer: >60 mL/min (ref >60)
GLUCOSE: 124 mg/dL — AB (ref 65–99)
POTASSIUM: 4.2 mmol/L (ref 3.5–5.1)
Sodium: 137 mmol/L (ref 135–145)

## 2016-07-09 LAB — BRAIN NATRIURETIC PEPTIDE: B Natriuretic Peptide: 3205.9 pg/mL — ABNORMAL HIGH (ref 0.0–100.0)

## 2016-07-09 MED ORDER — SACUBITRIL-VALSARTAN 49-51 MG PO TABS: 1.0000 | ORAL_TABLET | Freq: Two times a day (BID) | ORAL | 3 refills | Status: DC

## 2016-07-09 MED ORDER — CARVEDILOL 3.125 MG PO TABS: 3.1250 mg | ORAL_TABLET | Freq: Two times a day (BID) | ORAL | 2 refills | Status: DC

## 2016-07-09 NOTE — Patient Instructions (Addendum)
Increase Carvedilol to 3.125 (1 Tablet) Two times Daily  Increase Entresto to 49/51mg  (1 Tablet) Two times Daily  Labs today (will call for abnormal results, otherwise no news is good news)  Echo has been ordered for you  Follow up in 6 weeks

## 2016-07-09 NOTE — Progress Notes (Signed)
Patient ID: [name], male   DOB: [birth year], 46 y.o.   MRN: [id number]    Primary Cardiologist: Dr Domenic Polite  HPI: [name] is 45 year old with history of NICM (diagnosed 10/2010 - norm cors 0000000), chronic systolic heart failure EF 15% (08/2013), S/P ICD [employer], smoker, Wilms tumor s/p nephrectomy at age 55 had chemoradiation, scoliosis.    We saw him at the end of March 2015 and had large recurrent R pleural effusion. Referred to Dr. Prescott Gum who placed Pleurex catheter on 4/10, removed on 09/28/13.   CPX (5/15) with RER 1.06, VO2 max 18.5, VE/VCO2 slope 32.1.  Moderate to severe functional limitation, circulatory.   We have not seen him in 2.5 years. Has been following with Dr. Lovena Le. In 11/17 was admitted to [facility] with syncopal episode. ICD interrogation was negative. Says he feels great. Went rabbit hunting Sunday. Walked 1 mile with no SOB unless going up steep hill. Mild edema. No orthopnea or PND. Taking all medications. No dizziness. Has been off carvedilol for a couple weeks because prescription run out. Taking torsemide 20 daily. Weight stable. No longer smokin. Dipping occasionally.   ECHO 04/2014 EF 10% RV mildly dilated.    Labs (3/15): K 3.9 Creatinine 0.7 Labs (5/15): K 5.4, creatinine 0.81, digoxin 0.6 Labs (11/12/13) K 4.4 Creatinine 0.81   SH: Lives with his son 44 year old . Unemployed. Disability pending. Drinks one-two beers occasionally. Former smoker quit January 2015. Dips tobacco  FH: Grandfather MI  ROS: All systems negative except as listed in HPI, PMH and Problem List.  Past Medical History:  Diagnosis Date  . Arthritis   . Cardiomyopathy, nonischemic (HCC)    takes Digoxin daily and Carvedilol daily  . CHF (congestive heart failure) (HCC)    takes Lasix daily as well Aldactone  . Chronic back pain   . Chronic systolic heart failure ([place])   . Depression    takes Prozac daily  . GERD (gastroesophageal reflux disease)    takes  Omeprazole daily  . Hx of Alcohol consumption heavy    rare beer currently  . hx of Tobacco abuse    quit smoking 2014  . Hyperlipidemia    was on Simvastatin but has been off a year  . Insomnia    takes Ambien as needed(just got script yesterday)  . Orthostatic hypotension   . Scoliosis   . Shortness of breath    lying flat and with exertion  . 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 42 (XRT and chemo)    Current Outpatient Prescriptions  Medication Sig Dispense Refill  . aspirin 81 MG tablet Take 81 mg by mouth daily.      . Multiple Vitamin (MULTIVITAMIN WITH MINERALS) TABS tablet Take 1 tablet by mouth daily.    Marland Kitchen oxyCODONE (OXY IR/ROXICODONE) 5 MG immediate release tablet Take 1 tablet (5 mg total) by mouth 3 (three) times daily as needed for severe pain. 60 tablet 0  . potassium chloride SA (K-DUR,KLOR-CON) 20 MEQ tablet Take 1 tablet (20 mEq total) by mouth 2 (two) times daily. 60 tablet 1  . sacubitril-valsartan (ENTRESTO) 24-26 MG Take 1 tablet by mouth 2 (two) times daily. 60 tablet 3  . torsemide (DEMADEX) 20 MG tablet Take 1 tablet (20 mg total) by mouth daily. 30 tablet 3  . carvedilol (COREG) 3.125 MG tablet Take 1 tablet (3.125 mg total) by mouth 2 (two) times daily with a meal. (Patient not taking: Reported  on 07/09/2016) 180 tablet 2   No current facility-administered medications for this encounter.      PHYSICAL EXAM: Vitals:   07/09/16 1023  BP: 114/76  Pulse: 90  SpO2: 100%  Weight: 138 lb 12 oz (62.9 kg)    General:  Thin. . No resp difficulty HEENT: normal Neck: supple. JVP to jaw with prominent CV waves  Carotids 2+ bilaterally; no bruits. No lymphadenopathy or thryomegaly appreciated. Cor: PMI normal. Regular rate & rhythm. No rubs. MR 3/6. 2/6 TR L upper chest ICD Lungs: Clear Abdomen: soft, nontender, nondistended. No hepatosplenomegaly. No bruits or masses. Good bowel sounds. Extremities: no cyanosis, clubbing, rash, edema Neuro: alert & orientedx3,  cranial nerves grossly intact. Moves all 4 extremities w/o difficulty. Affect pleasant.  ASSESSMENT & PLAN: 1. Chronic Systolic HF:  Nonischemic cardiomyopathy, ECHO 03/2014 EF 10%.  [place].  Moderate functional limitation on CPX primarily circulatory in origin.   --Overall doing well. NYHA I-II but has prominent JVD on exam with loud MR murmur on exam --Will increase entresto to 49/51 --Continue torsemide 20 daily - can increase as needed.  --Continue carvedilol 3.125 bid --Consider spiro but has had problems with hyperK in past --Needs repeat echo and possibly CPX.  --Check labs today --Will need very close f/u  Glori Bickers MD 07/09/2016

## 2016-07-09 NOTE — Addendum Note (Signed)
Encounter addended by: Kennieth Rad, RN on: 07/09/2016 11:04 AM<BR>    Actions taken: Sign clinical note

## 2016-07-09 NOTE — Addendum Note (Signed)
Encounter addended by: Kennieth Rad, RN on: 07/09/2016 11:04 AM<BR>    Actions taken: Order list changed, Diagnosis association updated, Sign clinical note

## 2016-07-12 ENCOUNTER — Ambulatory Visit (INDEPENDENT_AMBULATORY_CARE_PROVIDER_SITE_OTHER): Payer: Medicare HMO | Admitting: Internal Medicine

## 2016-07-12 ENCOUNTER — Encounter: Payer: Self-pay | Admitting: Internal Medicine

## 2016-07-12 ENCOUNTER — Ambulatory Visit (HOSPITAL_COMMUNITY)
Admission: RE | Admit: 2016-07-12 | Discharge: 2016-07-12 | Disposition: A | Discharge status: Home / Self Care | Payer: Medicare HMO | Source: Ambulatory Visit | Attending: Internal Medicine | Admitting: Internal Medicine

## 2016-07-12 DIAGNOSIS — I5022 Chronic systolic (congestive) heart failure: Secondary | ICD-10-CM | POA: Diagnosis not present

## 2016-07-12 DIAGNOSIS — I420 Dilated cardiomyopathy: Secondary | ICD-10-CM

## 2016-07-12 DIAGNOSIS — I071 Rheumatic tricuspid insufficiency: Secondary | ICD-10-CM | POA: Diagnosis not present

## 2016-07-12 DIAGNOSIS — I313 Pericardial effusion (noninflammatory): Secondary | ICD-10-CM | POA: Insufficient documentation

## 2016-07-12 DIAGNOSIS — E785 Hyperlipidemia, unspecified: Secondary | ICD-10-CM | POA: Insufficient documentation

## 2016-07-12 DIAGNOSIS — Z95810 Presence of automatic (implantable) cardiac defibrillator: Secondary | ICD-10-CM | POA: Diagnosis not present

## 2016-07-12 DIAGNOSIS — I429 Cardiomyopathy, unspecified: Secondary | ICD-10-CM

## 2016-07-12 LAB — ECHOCARDIOGRAM COMPLETE
AO mean calculated velocity dopler: 46.8 cm/s
AOPV: 0.91 m/s
AOVTI: 13.1 cm
AV Area VTI index: 1.15 cm2/m2
AV Area VTI: 2.6 cm2
AV Mean grad: 1 mmHg
AV Peak grad: 2 mmHg
AV VEL mean LVOT/AV: 0.85
AV peak Index: 1.48
AVAREAMEANV: 2.43 cm2
AVAREAMEANVIN: 1.38 cm2/m2
AVLVOTPG: 2 mmHg
AVPKVEL: 67.8 cm/s
CHL CUP AV VEL: 2.03
CHL CUP DOP CALC LVOT VTI: 9.36 cm
CHL CUP STROKE VOLUME: 26 mL
E decel time: 127 msec
FS: 5 % — AB (ref 28–44)
HEIGHTINCHES: 69 in
IVS/LV PW RATIO, ED: 0.66
LA ID, A-P, ES: 45 mm
LA diam end sys: 45 mm
LA vol index: 40.3 mL/m2
LA vol: 70.9 mL
LADIAMINDEX: 2.56 cm/m2
LAVOLA4C: 80.6 mL
LV PW d: 10.6 mm — AB (ref 0.6–1.1)
LV SIMPSON'S DISK: 20
LV dias vol index: 73 mL/m2
LVDIAVOL: 129 mL (ref 62–150)
LVOT SV: 27 mL
LVOT area: 2.84 cm2
LVOT diameter: 19 mm
LVOTPV: 62 cm/s
LVOTVTI: 0.71 cm
LVSYSVOL: 103 mL — AB (ref 21–61)
LVSYSVOLIN: 59 mL/m2
MV Dec: 127
MV Peak grad: 2 mmHg
MV pk E vel: 78.3 m/s
MVPKAVEL: 19.6 m/s
RV LATERAL S' VELOCITY: 6.12 cm/s
RV TAPSE: 13.5 mm
RV sys press: 24 mmHg
Reg peak vel: 198 cm/s
TR max vel: 198 cm/s
Valve area index: 1.15
Valve area: 2.03 cm2
Weight: 2256 oz

## 2016-07-12 LAB — CUP PACEART INCLINIC DEVICE CHECK
HIGH POWER IMPEDANCE MEASURED VALUE: 62 Ohm
Implantable Lead Model: 180
Implantable Lead Serial Number: 307189
Implantable Pulse Generator Implant Date: 20121214
Lead Channel Impedance Value: 525 Ohm
Lead Channel Pacing Threshold Amplitude: 0.7 V
Lead Channel Pacing Threshold Pulse Width: 0.4 ms
Lead Channel Sensing Intrinsic Amplitude: 11.3 mV
MDC IDC LEAD IMPLANT DT: 20121214
MDC IDC LEAD LOCATION: 753860
MDC IDC MSMT BATTERY REMAINING LONGEVITY: 108 mo
MDC IDC PG SERIAL: 118994
MDC IDC SESS DTM: 20180201172711
MDC IDC SET LEADCHNL RV PACING AMPLITUDE: 2.4 V
MDC IDC SET LEADCHNL RV PACING PULSEWIDTH: 0.4 ms
MDC IDC SET LEADCHNL RV SENSING SENSITIVITY: 0.4 mV
MDC IDC STAT BRADY RV PERCENT PACED: 0 %

## 2016-07-12 NOTE — Progress Notes (Signed)
*  PRELIMINARY RESULTS* Echocardiogram 2D Echocardiogram has been performed.  [name] 07/12/2016, 12:18 PM

## 2016-07-12 NOTE — Patient Instructions (Signed)
Your physician wants you to follow-up in: 1 Year with Dr. Lovena Le. You will receive a reminder letter in the mail two months in advance. If you don't receive a letter, please call our office to schedule the follow-up appointment.  Remote monitoring is used to monitor your Pacemaker of ICD from home. This monitoring reduces the number of office visits required to check your device to one time per year. It allows [place] to keep an eye on the functioning of your device to ensure it is working properly. You are scheduled for a device check from home on 10/11/16. You may send your transmission at any time that day. If you have a wireless device, the transmission will be sent automatically. After your physician reviews your transmission, you will receive a postcard with your next transmission date.  Your physician recommends that you continue on your current medications as directed. Please refer to the Current Medication list given to you today.  If you need a refill on your cardiac medications before your next appointment, please call your pharmacy.  Thank you for choosing [facility]!

## 2016-07-12 NOTE — Progress Notes (Signed)
HPI [name] returns today for followup. He is a very pleasant 46 year old man with a nonischemic cardiomyopathy, chronic systolic heart failure, status post ICD implantation. The patient has done well for years but over the past few months has had worsening sob, peripheral edema and now abdominal distention. In the past he has seen Dr. Reine Just but has not seen recently. He has recently had pain in his wisdom teeth, which are the only teeth in his head. He has not had fever or chills. He has admitted to some non-compliance. He has increased his lasix dose to 60 mg daily.  Allergies  Allergen Reactions  . Ibuprofen Hives  . Nsaids     ELEVATED LFT'S     Current Outpatient Prescriptions  Medication Sig Dispense Refill  . aspirin 81 MG tablet Take 81 mg by mouth daily.      . carvedilol (COREG) 3.125 MG tablet Take 1 tablet (3.125 mg total) by mouth 2 (two) times daily with a meal. 180 tablet 2  . Multiple Vitamin (MULTIVITAMIN WITH MINERALS) TABS tablet Take 1 tablet by mouth daily.    Marland Kitchen oxyCODONE (OXY IR/ROXICODONE) 5 MG immediate release tablet Take 1 tablet (5 mg total) by mouth 3 (three) times daily as needed for severe pain. 60 tablet 0  . potassium chloride SA (K-DUR,KLOR-CON) 20 MEQ tablet Take 1 tablet (20 mEq total) by mouth 2 (two) times daily. 60 tablet 1  . sacubitril-valsartan (ENTRESTO) 49-51 MG Take 1 tablet by mouth 2 (two) times daily. 60 tablet 3  . torsemide (DEMADEX) 20 MG tablet Take 1 tablet (20 mg total) by mouth daily. 30 tablet 3   No current facility-administered medications for this visit.      Past Medical History:  Diagnosis Date  . Arthritis   . Cardiomyopathy, nonischemic (HCC)    takes Digoxin daily and Carvedilol daily  . CHF (congestive heart failure) (HCC)    takes Lasix daily as well Aldactone  . Chronic back pain   . Chronic systolic heart failure ([place])   . Depression    takes Prozac daily  . GERD (gastroesophageal reflux disease)    takes  Omeprazole daily  . Hx of Alcohol consumption heavy    rare beer currently  . hx of Tobacco abuse    quit smoking 2014  . Hyperlipidemia    was on Simvastatin but has been off a year  . Insomnia    takes Ambien as needed(just got script yesterday)  . Orthostatic hypotension   . Scoliosis   . Shortness of breath    lying flat and with exertion  . 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 34 (XRT and chemo)    ROS:   All systems reviewed and negative except as noted in the HPI.   Past Surgical History:  Procedure Laterality Date  . CARPAL TUNNEL RELEASE Right 01/09/2013   Procedure: CARPAL TUNNEL RELEASE;  Surgeon: Winfield Cunas, MD;  Location: [place] NEURO ORS;  Service: Neurosurgery;  Laterality: Right;  RIGHT carpal tunnel release  . CARPAL TUNNEL RELEASE Left 01/30/2013   Procedure: LEFT CARPAL TUNNEL RELEASE;  Surgeon: Winfield Cunas, MD;  Location: [place] NEURO ORS;  Service: Neurosurgery;  Laterality: Left;  LEFT Carpal Tunnel release  . CHEST TUBE INSERTION Right 09/09/2013   Procedure: INSERTION PLEURAL DRAINAGE CATHETER;  Surgeon: Ivin Poot, MD;  Location: [facility];  Service: Thoracic;  Laterality: Right;  . ICD  05/25/2011   [employer] End[employer]ak [employer] SG lead/Energen single  chamber device  . IMPLANTABLE CARDIOVERTER DEFIBRILLATOR IMPLANT N/A 05/25/2011   Procedure: IMPLANTABLE CARDIOVERTER DEFIBRILLATOR IMPLANT;  Surgeon: Evans Lance, MD;  Location: [facility] CATH LAB;  Service: Cardiovascular;  Laterality: N/A;  . NEPHRECTOMY  36 yrs ago   Wilms Tumor  . ULNAR NERVE TRANSPOSITION Right 01/09/2013   Procedure: ULNAR NERVE DECOMPRESSION/TRANSPOSITION;  Surgeon: Winfield Cunas, MD;  Location: [place] NEURO ORS;  Service: Neurosurgery;  Laterality: Right;  RIGHT ulnar nerve decompression     Family History  Problem Relation Age of Onset  . Diabetes Mother      Social History   Social History  . Marital status: Legally Separated    Spouse name: N/A  . Number of children: 1  . Years  of education: N/A   Occupational History  . Full time     Engineer, civil (consulting)   Social History Main Topics  . Smoking status: Former Smoker    Packs/day: 0.25    Years: 20.00    Types: Cigarettes    Quit date: 09/07/2013  . Smokeless tobacco: Current User    Types: Snuff, Chew     Comment: quit smoking the 1st of Jan 2015. dip snuff  . Alcohol use No     Comment: histoorically a heavy drinker ("beer only")  . Drug use: No  . Sexual activity: Not Currently   Other Topics Concern  . Not on file   Social History Narrative  . No narrative on file     BP 100/70   Pulse 84   Ht 5\' 9"  (1.753 m)   Wt 141 lb (64 kg)   SpO2 99%   BMI 20.82 kg/m   Physical Exam:  Well appearing 46 year old man, NAD HEENT: Unremarkable Neck:  6 cm JVD, no thyromegally Back:  No CVA tenderness, marked kyphoscoliosis. Lungs:  Clear with basilar rales, or rhonchi. Well-healed ICD incision. HEART:  Regular rate rhythm, 1/6 systolic murmur, prominent S3, no rubs, no clicks, PMI is enlarged. Abd:  Soft, mildly protuberant, positive bowel sounds, no organomegally, no rebound, no guarding Ext:  2 plus pulses, 2+ peripheral edema, no cyanosis, no clubbing Skin:  No rashes no nodules Neuro:  CN II through XII intact, motor grossly intact  DEVICE  Normal device function.  See PaceArt for details.   Assess/Plan:  1. VT - he has had a couple of non-sustained episodes. Will follow. No AA drugs at this time. 2. Chronic systolic heart failure - his symptoms are improved after stopping lisinopril, starting Entresto, and switching from lasix to torsemide. He is now back in the CHF and will followup with Dr. Reine Just. He still has a loud S3 gallop 3. MR - on exam today, his MR murmur remains though maybe not quite as bad. He will undergo 2D echo.  4. ICD - his [employer] device is working normally. Will recheck in several months.  Mikle Bosworth.D.

## 2016-07-13 ENCOUNTER — Other Ambulatory Visit (HOSPITAL_COMMUNITY): Payer: Self-pay

## 2016-07-13 ENCOUNTER — Telehealth (HOSPITAL_COMMUNITY): Payer: Self-pay

## 2016-07-13 DIAGNOSIS — I5043 Acute on chronic combined systolic (congestive) and diastolic (congestive) heart failure: Secondary | ICD-10-CM

## 2016-07-13 DIAGNOSIS — I5022 Chronic systolic (congestive) heart failure: Secondary | ICD-10-CM

## 2016-07-13 MED ORDER — TORSEMIDE 20 MG PO TABS: 20.0000 mg | ORAL_TABLET | Freq: Two times a day (BID) | ORAL | 3 refills | Status: DC

## 2016-07-13 NOTE — Telephone Encounter (Signed)
Faylene Million Peptide  Order: [id number]  Status:  Final result Visible to patient:  No (Not Released) Dx:  Chronic systolic congestive heart fai...  Notes Recorded by Effie Berkshire, RN on 07/13/2016 at 5:01 PM EST Left detailed VM stating details and lab apt on Wednesday. Advised to return call to CHF clinic to confirm receiving this message. ------  Notes Recorded by Jolaine Artist, MD on 07/11/2016 at 7:58 PM EST Increase torsemide to 20 bid. Recheck BMET 1 week.

## 2016-07-18 ENCOUNTER — Ambulatory Visit (HOSPITAL_COMMUNITY)
Admission: RE | Admit: 2016-07-18 | Discharge: 2016-07-18 | Disposition: A | Discharge status: Home / Self Care | Payer: Medicare HMO | Source: Ambulatory Visit | Attending: Internal Medicine | Admitting: Internal Medicine

## 2016-07-18 DIAGNOSIS — I5043 Acute on chronic combined systolic (congestive) and diastolic (congestive) heart failure: Secondary | ICD-10-CM | POA: Insufficient documentation

## 2016-07-18 LAB — BASIC METABOLIC PANEL
Anion gap: 9 (ref 5–15)
BUN: 15 mg/dL (ref 6–20)
CHLORIDE: 100 mmol/L — AB (ref 101–111)
CO2: 30 mmol/L (ref 22–32)
CREATININE: 1.02 mg/dL (ref 0.61–1.24)
Calcium: 9.7 mg/dL (ref 8.9–10.3)
GFR calc Af Amer: >60 mL/min (ref >60)
GFR calc non Af Amer: >60 mL/min (ref >60)
Glucose, Bld: 128 mg/dL — ABNORMAL HIGH (ref 65–99)
Potassium: 4.2 mmol/L (ref 3.5–5.1)
SODIUM: 139 mmol/L (ref 135–145)

## 2016-07-26 ENCOUNTER — Telehealth: Payer: Self-pay | Admitting: *Deleted

## 2016-07-26 MED ORDER — OXYCODONE HCL 5 MG PO TABS: 5.0000 mg | ORAL_TABLET | Freq: Three times a day (TID) | ORAL | 0 refills | Status: DC | PRN

## 2016-07-26 NOTE — Telephone Encounter (Signed)
Prescription printed and patient made aware to come to office to pick up on 07/27/2016.

## 2016-07-26 NOTE — Telephone Encounter (Signed)
Okay to refill? 

## 2016-07-26 NOTE — Telephone Encounter (Signed)
Received call from patient.   Requested refill on Oycodone.   Ok to refill??  Last office visit 05/18/2016.  Last refill 06/26/2016.

## 2016-08-20 ENCOUNTER — Encounter (HOSPITAL_COMMUNITY): Payer: Self-pay | Admitting: Internal Medicine

## 2016-08-20 ENCOUNTER — Ambulatory Visit (HOSPITAL_COMMUNITY)
Admission: RE | Admit: 2016-08-20 | Discharge: 2016-08-20 | Disposition: A | Discharge status: Home / Self Care | Payer: Medicare HMO | Source: Ambulatory Visit | Attending: Internal Medicine | Admitting: Internal Medicine

## 2016-08-20 VITALS — BP 108/68 | HR 90 | Wt 135.5 lb

## 2016-08-20 DIAGNOSIS — M419 Scoliosis, unspecified: Secondary | ICD-10-CM | POA: Diagnosis not present

## 2016-08-20 DIAGNOSIS — Z9221 Personal history of antineoplastic chemotherapy: Secondary | ICD-10-CM | POA: Insufficient documentation

## 2016-08-20 DIAGNOSIS — Z7982 Long term (current) use of aspirin: Secondary | ICD-10-CM | POA: Insufficient documentation

## 2016-08-20 DIAGNOSIS — Z85528 Personal history of other malignant neoplasm of kidney: Secondary | ICD-10-CM | POA: Insufficient documentation

## 2016-08-20 DIAGNOSIS — Z95810 Presence of automatic (implantable) cardiac defibrillator: Secondary | ICD-10-CM | POA: Insufficient documentation

## 2016-08-20 DIAGNOSIS — Z87891 Personal history of nicotine dependence: Secondary | ICD-10-CM | POA: Diagnosis not present

## 2016-08-20 DIAGNOSIS — F329 Major depressive disorder, single episode, unspecified: Secondary | ICD-10-CM | POA: Diagnosis not present

## 2016-08-20 DIAGNOSIS — E785 Hyperlipidemia, unspecified: Secondary | ICD-10-CM | POA: Insufficient documentation

## 2016-08-20 DIAGNOSIS — I5022 Chronic systolic (congestive) heart failure: Secondary | ICD-10-CM | POA: Diagnosis not present

## 2016-08-20 DIAGNOSIS — M1990 Unspecified osteoarthritis, unspecified site: Secondary | ICD-10-CM | POA: Diagnosis not present

## 2016-08-20 DIAGNOSIS — Z923 Personal history of irradiation: Secondary | ICD-10-CM | POA: Insufficient documentation

## 2016-08-20 DIAGNOSIS — Z905 Acquired absence of kidney: Secondary | ICD-10-CM | POA: Insufficient documentation

## 2016-08-20 DIAGNOSIS — G4700 Insomnia, unspecified: Secondary | ICD-10-CM | POA: Diagnosis not present

## 2016-08-20 DIAGNOSIS — I428 Other cardiomyopathies: Secondary | ICD-10-CM | POA: Insufficient documentation

## 2016-08-20 DIAGNOSIS — Z79899 Other long term (current) drug therapy: Secondary | ICD-10-CM | POA: Insufficient documentation

## 2016-08-20 DIAGNOSIS — K219 Gastro-esophageal reflux disease without esophagitis: Secondary | ICD-10-CM | POA: Diagnosis not present

## 2016-08-20 LAB — BASIC METABOLIC PANEL
Anion gap: 8 (ref 5–15)
BUN: 22 mg/dL — ABNORMAL HIGH (ref 6–20)
CALCIUM: 9.6 mg/dL (ref 8.9–10.3)
CO2: 31 mmol/L (ref 22–32)
CREATININE: 0.94 mg/dL (ref 0.61–1.24)
Chloride: 99 mmol/L — ABNORMAL LOW (ref 101–111)
GFR calc non Af Amer: >60 mL/min (ref >60)
GLUCOSE: 91 mg/dL (ref 65–99)
Potassium: 4.3 mmol/L (ref 3.5–5.1)
Sodium: 138 mmol/L (ref 135–145)

## 2016-08-20 LAB — BRAIN NATRIURETIC PEPTIDE: B Natriuretic Peptide: 3325.4 pg/mL — ABNORMAL HIGH (ref 0.0–100.0)

## 2016-08-20 MED ORDER — SACUBITRIL-VALSARTAN 97-103 MG PO TABS: 1.0000 | ORAL_TABLET | Freq: Two times a day (BID) | ORAL | 6 refills | Status: DC

## 2016-08-20 NOTE — Addendum Note (Signed)
Encounter addended by: Scarlette Calico, RN on: 08/20/2016 12:18 PM<BR>    Actions taken: Order list changed, Diagnosis association updated, Sign clinical note

## 2016-08-20 NOTE — Patient Instructions (Signed)
Increase Entresto to 97/103 mg Twice daily   Labs today  Your physician has recommended that you have a cardiopulmonary stress test (CPX). CPX testing is a non-invasive measurement of heart and lung function. It replaces a traditional treadmill stress test. This type of test provides a tremendous amount of information that relates not only to your present condition but also for future outcomes. This test combines measurements of you ventilation, respiratory gas exchange in the lungs, electrocardiogram (EKG), blood pressure and physical response before, during, and following an exercise protocol.  Your physician recommends that you schedule a follow-up appointment in: 6 weeks

## 2016-08-20 NOTE — Progress Notes (Signed)
Patient ID: [name], male   DOB: 21-Jun-1970, 46 y.o.   MRN: [id number]    Primary Cardiologist: Dr Domenic Polite  HPI: [name] is 46 year old with history of NICM (diagnosed 10/2010 - norm cors 8299), chronic systolic heart failure EF 15% (08/2013), S/P ICD [employer], smoker, Wilms tumor s/p nephrectomy at age 46 had chemoradiation, scoliosis.    We saw him at the end of March 2015 and had large recurrent R pleural effusion. Referred to Dr. Prescott Gum who placed Pleurex catheter on 4/10, removed on 09/28/13.   CPX (5/15) with RER 1.06, VO2 max 18.5, VE/VCO2 slope 32.1.  Moderate to severe functional limitation, circulatory.   In 11/17 was admitted to [facility] with syncopal episode. ICD interrogation was negative.   At last visit we increased Entresto to 49/51. No problems with that. Says he feels great. Denies dizziness. Taking all medications. Can do anything he wants if he goes at a reasonable pace. Can walk up hills and go rabbit hunting. No CP, orthopnea or PND. Taking torsemide 20 daily. Weight stable. No longer smoking. Dipping occasionally.   ECHO 04/2014 EF 10% RV mildly dilated.   ECHO 2/18 EF 15% Mild Richard RV mild HK  Labs (3/15): K 3.9 Creatinine 0.7 Labs (5/15): K 5.4, creatinine 0.81, digoxin 0.6 Labs (11/12/13) K 4.4 Creatinine 0.81   SH: Lives with his son 56 year old . Unemployed. Disability pending. Drinks one-two beers occasionally. Former smoker quit January 2015. Dips tobacco  FH: Grandfather MI  ROS: All systems negative except as listed in HPI, PMH and Problem List.  Past Medical History:  Diagnosis Date  . Arthritis   . Cardiomyopathy, nonischemic (HCC)    takes Digoxin daily and Carvedilol daily  . CHF (congestive heart failure) (HCC)    takes Lasix daily as well Aldactone  . Chronic back pain   . Chronic systolic heart failure ([place])   . Depression    takes Prozac daily  . GERD (gastroesophageal reflux disease)    takes Omeprazole daily    . Hx of Alcohol consumption heavy    rare beer currently  . hx of Tobacco abuse    quit smoking 2014  . Hyperlipidemia    was on Simvastatin but has been off a year  . Insomnia    takes Ambien as needed(just got script yesterday)  . Orthostatic hypotension   . Scoliosis   . Shortness of breath    lying flat and with exertion  . 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 46 (XRT and chemo)    Current Outpatient Prescriptions  Medication Sig Dispense Refill  . aspirin 81 MG tablet Take 81 mg by mouth daily.      . carvedilol (COREG) 3.125 MG tablet Take 1 tablet (3.125 mg total) by mouth 2 (two) times daily with a meal. 180 tablet 2  . Multiple Vitamin (MULTIVITAMIN WITH MINERALS) TABS tablet Take 1 tablet by mouth daily.    Marland Kitchen oxyCODONE (OXY IR/ROXICODONE) 5 MG immediate release tablet Take 1 tablet (5 mg total) by mouth 3 (three) times daily as needed for severe pain. 60 tablet 0  . potassium chloride SA (K-DUR,KLOR-CON) 20 MEQ tablet Take 1 tablet (20 mEq total) by mouth 2 (two) times daily. 60 tablet 1  . sacubitril-valsartan (ENTRESTO) 49-51 MG Take 1 tablet by mouth 2 (two) times daily. 60 tablet 3  . torsemide (DEMADEX) 20 MG tablet Take 20 mg by mouth daily.     No  current facility-administered medications for this encounter.      PHYSICAL EXAM: Vitals:   08/20/16 1131  BP: 108/68  Pulse: 90  SpO2: 99%  Weight: 135 lb 8 oz (61.5 kg)   Filed Weights   08/20/16 1131  Weight: 135 lb 8 oz (61.5 kg)    General:  Thin. . No resp difficulty HEENT: normal Neck: supple. JVP to jaw with prominent CV waves  Carotids 2+ bilaterally; no bruits. No lymphadenopathy or thryomegaly appreciated. Cor: PMI normal. Regular rate & rhythm. No rubs. Richard 2/6 2/6 TR L upper chest ICD Lungs: Clear Abdomen: soft, nontender, nondistended. No hepatosplenomegaly. No bruits or masses. Good bowel sounds. Extremities: no cyanosis, clubbing, rash, edema Neuro: alert & orientedx3, cranial nerves grossly  intact. Moves all 4 extremities w/o difficulty. Affect pleasant.  ASSESSMENT & PLAN: 1. Chronic Systolic HF:  Nonischemic cardiomyopathy, ECHO 03/2014 EF 10%. Echo 2/18 EF 15% RV mild HK  [employer] ICD.  Moderate functional limitation on CPX primarily circulatory in origin.   --Overall doing well. NYHA I-II but has prominent JVD on exam with loud Richard murmur on exam --Will increase entresto to 97/103 --Continue torsemide 20 daily  --Continue carvedilol 3.125 bid --Consider spiro at next visit but has had problems with hyperK in past -- Repeat CPX. Will need to follow closely for advanced therapies    Glori Bickers MD 08/20/2016

## 2016-08-21 ENCOUNTER — Ambulatory Visit: Payer: Medicare HMO | Admitting: Family Medicine

## 2016-08-22 ENCOUNTER — Encounter: Payer: Self-pay | Admitting: Family Medicine

## 2016-08-22 ENCOUNTER — Ambulatory Visit (INDEPENDENT_AMBULATORY_CARE_PROVIDER_SITE_OTHER): Payer: Medicare HMO | Admitting: Family Medicine

## 2016-08-22 VITALS — BP 112/78 | HR 98 | Temp 98.5°F | Resp 14 | Ht 69.0 in | Wt 135.0 lb

## 2016-08-22 DIAGNOSIS — I5022 Chronic systolic (congestive) heart failure: Secondary | ICD-10-CM

## 2016-08-22 DIAGNOSIS — G894 Chronic pain syndrome: Secondary | ICD-10-CM

## 2016-08-22 DIAGNOSIS — K7460 Unspecified cirrhosis of liver: Secondary | ICD-10-CM | POA: Diagnosis not present

## 2016-08-22 MED ORDER — OXYCODONE HCL 5 MG PO TABS: 5.0000 mg | ORAL_TABLET | Freq: Three times a day (TID) | ORAL | 0 refills | Status: DC | PRN

## 2016-08-22 NOTE — Progress Notes (Signed)
   Subjective:    Patient ID: [name], male    DOB: 12-14-1970, 46 y.o.   MRN: [id number]  Patient presents for 4 month F/U (is not fasting)  Pt here to f/u chronic medical problems ,last visi twas having difficulty with CHF and fluid rentention. Last visit with cardiology on Monday. Reviewed note he is scheduled for stress testing. He is also on Entresto for heart failure. diuretic is demadex 20nmg once a day  Has EF 15% He has no new concerns today states that he is feeling well. He is still taking his pain medicine as prescribed and does request a refill on this. I reviewed his labs that were done 2 days ago. Renal function is preserved He also admits he has cut back significantly on his alcohol    Review Of Systems:  GEN- denies fatigue, fever, weight loss,weakness, recent illness HEENT- denies eye drainage, change in vision, nasal discharge, CVS- denies chest pain, palpitations RESP- denies SOB, cough, wheeze ABD- denies N/V, change in stools, abd pain GU- denies dysuria, hematuria, dribbling, incontinence MSK- denies joint pain, muscle aches, injury Neuro- denies headache, dizziness, syncope, seizure activity       Objective:    BP 112/78   Pulse 98   Temp 98.5 F (36.9 C) (Oral)   Resp 14   Ht 5\' 9"  (0.165 m)   Wt 135 lb (61.2 kg)   SpO2 98%   BMI 19.94 kg/m  GEN- NAD, alert and oriented x3 HEENT- PERRL, EOMI, non injected sclera, pink conjunctiva, MMM, oropharynx clear CVS- RRR, systolic murmur RESP-CTAB EXT- No edema  Pulses- Radial, DP- 2+        Assessment & Plan:      Problem List Items Addressed This Visit    Cirrhosis of liver ([place])    He declines follow-up with GI at this time. We are monitoring his liver function tests. I will obtain an AFP with his next set of labs he is aware of the need for alcohol cessation.      Chronic pain syndrome - Primary    Continued on oxycodone      Chronic CHF (congestive heart failure) ([place])   Currently at baseline, reviewed cardiology note and labs         Note: This dictation was prepared with Dragon dictation along with smaller phrase technology. Any transcriptional errors that result from this process are unintentional.

## 2016-08-22 NOTE — Assessment & Plan Note (Signed)
He declines follow-up with GI at this time. We are monitoring his liver function tests. I will obtain an AFP with his next set of labs he is aware of the need for alcohol cessation.

## 2016-08-22 NOTE — Patient Instructions (Signed)
F/U 6 months For Physical 

## 2016-08-22 NOTE — Assessment & Plan Note (Signed)
Continued on oxycodone

## 2016-08-22 NOTE — Assessment & Plan Note (Signed)
Currently at baseline, reviewed cardiology note and labs

## 2016-08-23 ENCOUNTER — Encounter: Payer: Self-pay | Admitting: Family Medicine

## 2016-09-03 ENCOUNTER — Encounter (HOSPITAL_COMMUNITY): Payer: Medicare HMO

## 2016-09-17 ENCOUNTER — Ambulatory Visit (HOSPITAL_COMMUNITY): Payer: Medicare HMO | Attending: Cardiology

## 2016-09-17 ENCOUNTER — Other Ambulatory Visit (HOSPITAL_COMMUNITY): Payer: Self-pay | Admitting: *Deleted

## 2016-09-17 DIAGNOSIS — I5022 Chronic systolic (congestive) heart failure: Secondary | ICD-10-CM | POA: Insufficient documentation

## 2016-09-24 ENCOUNTER — Telehealth: Payer: Self-pay | Admitting: *Deleted

## 2016-09-24 MED ORDER — OXYCODONE HCL 5 MG PO TABS: 5.0000 mg | ORAL_TABLET | Freq: Three times a day (TID) | ORAL | 0 refills | Status: DC | PRN

## 2016-09-24 NOTE — Telephone Encounter (Signed)
Okay to refill? 

## 2016-09-24 NOTE — Telephone Encounter (Signed)
Prescription printed and patient made aware to come to office to pick up after 3pm on 09/24/2016 per VM.

## 2016-09-24 NOTE — Telephone Encounter (Signed)
Received call from patient.   Requested refill on Oxycodone.   Ok to refill??  Last office visit/ refill 08/22/2016.

## 2016-10-03 ENCOUNTER — Ambulatory Visit (HOSPITAL_COMMUNITY)
Admission: RE | Admit: 2016-10-03 | Discharge: 2016-10-03 | Disposition: A | Discharge status: Home / Self Care | Payer: Medicare HMO | Source: Ambulatory Visit | Attending: Internal Medicine | Admitting: Internal Medicine

## 2016-10-03 ENCOUNTER — Encounter (HOSPITAL_COMMUNITY): Payer: Self-pay | Admitting: Internal Medicine

## 2016-10-03 VITALS — BP 122/80 | HR 83 | Wt 132.5 lb

## 2016-10-03 DIAGNOSIS — Z923 Personal history of irradiation: Secondary | ICD-10-CM | POA: Insufficient documentation

## 2016-10-03 DIAGNOSIS — Z8249 Family history of ischemic heart disease and other diseases of the circulatory system: Secondary | ICD-10-CM | POA: Insufficient documentation

## 2016-10-03 DIAGNOSIS — Z7982 Long term (current) use of aspirin: Secondary | ICD-10-CM | POA: Diagnosis not present

## 2016-10-03 DIAGNOSIS — Z85528 Personal history of other malignant neoplasm of kidney: Secondary | ICD-10-CM | POA: Insufficient documentation

## 2016-10-03 DIAGNOSIS — Z9221 Personal history of antineoplastic chemotherapy: Secondary | ICD-10-CM | POA: Diagnosis not present

## 2016-10-03 DIAGNOSIS — Z95810 Presence of automatic (implantable) cardiac defibrillator: Secondary | ICD-10-CM | POA: Diagnosis not present

## 2016-10-03 DIAGNOSIS — Z87891 Personal history of nicotine dependence: Secondary | ICD-10-CM | POA: Insufficient documentation

## 2016-10-03 DIAGNOSIS — Z79899 Other long term (current) drug therapy: Secondary | ICD-10-CM | POA: Insufficient documentation

## 2016-10-03 DIAGNOSIS — Z905 Acquired absence of kidney: Secondary | ICD-10-CM | POA: Diagnosis not present

## 2016-10-03 DIAGNOSIS — I5022 Chronic systolic (congestive) heart failure: Secondary | ICD-10-CM | POA: Diagnosis not present

## 2016-10-03 DIAGNOSIS — F329 Major depressive disorder, single episode, unspecified: Secondary | ICD-10-CM | POA: Insufficient documentation

## 2016-10-03 DIAGNOSIS — K219 Gastro-esophageal reflux disease without esophagitis: Secondary | ICD-10-CM | POA: Diagnosis not present

## 2016-10-03 DIAGNOSIS — G4700 Insomnia, unspecified: Secondary | ICD-10-CM | POA: Diagnosis not present

## 2016-10-03 DIAGNOSIS — M549 Dorsalgia, unspecified: Secondary | ICD-10-CM | POA: Insufficient documentation

## 2016-10-03 DIAGNOSIS — G8929 Other chronic pain: Secondary | ICD-10-CM | POA: Insufficient documentation

## 2016-10-03 DIAGNOSIS — I428 Other cardiomyopathies: Secondary | ICD-10-CM | POA: Diagnosis not present

## 2016-10-03 DIAGNOSIS — E785 Hyperlipidemia, unspecified: Secondary | ICD-10-CM | POA: Diagnosis not present

## 2016-10-03 DIAGNOSIS — I071 Rheumatic tricuspid insufficiency: Secondary | ICD-10-CM

## 2016-10-03 DIAGNOSIS — R69 Illness, unspecified: Secondary | ICD-10-CM | POA: Diagnosis not present

## 2016-10-03 LAB — BASIC METABOLIC PANEL
ANION GAP: 11 (ref 5–15)
BUN: 10 mg/dL (ref 6–20)
CHLORIDE: 97 mmol/L — AB (ref 101–111)
CO2: 31 mmol/L (ref 22–32)
Calcium: 9.5 mg/dL (ref 8.9–10.3)
Creatinine, Ser: 0.93 mg/dL (ref 0.61–1.24)
GFR calc non Af Amer: >60 mL/min (ref >60)
GLUCOSE: 133 mg/dL — AB (ref 65–99)
POTASSIUM: 3.3 mmol/L — AB (ref 3.5–5.1)
Sodium: 139 mmol/L (ref 135–145)

## 2016-10-03 LAB — CBC
HEMATOCRIT: 43.5 % (ref 39.0–52.0)
Hemoglobin: 14.9 g/dL (ref 13.0–17.0)
MCH: 31.8 pg (ref 26.0–34.0)
MCHC: 34.3 g/dL (ref 30.0–36.0)
MCV: 92.8 fL (ref 78.0–100.0)
Platelets: 226 10*3/uL (ref 150–400)
RBC: 4.69 MIL/uL (ref 4.22–5.81)
RDW: 14.7 % (ref 11.5–15.5)
WBC: 3.9 10*3/uL — ABNORMAL LOW (ref 4.0–10.5)

## 2016-10-03 LAB — TYPE AND SCREEN
ABO/RH(D): A POS
ANTIBODY SCREEN: NEGATIVE

## 2016-10-03 LAB — ABO/RH: ABO/RH(D): A POS

## 2016-10-03 LAB — BRAIN NATRIURETIC PEPTIDE: B Natriuretic Peptide: >4500 pg/mL — ABNORMAL HIGH (ref 0.0–100.0)

## 2016-10-03 MED ORDER — SACUBITRIL-VALSARTAN 49-51 MG PO TABS: 1.0000 | ORAL_TABLET | Freq: Two times a day (BID) | ORAL | 6 refills | Status: DC

## 2016-10-03 NOTE — Progress Notes (Signed)
Patient ID: [name], male   DOB: November 13, 1970, 46 y.o.   MRN: [id number]    Primary Cardiologist: Dr [name]  HPI: [name] is 46 year old with history of NICM (diagnosed 10/2010 - norm cors 7829), chronic systolic heart failure EF 15% (08/2013), S/P ICD [employer], smoker, Wilms tumor s/p nephrectomy at age 89 had chemoradiation, scoliosis.    We saw him at the end of March 2015 and had large recurrent R pleural effusion. Referred to Dr. Prescott [name] who placed Pleurex catheter on 4/10, removed on 09/28/13.   CPX (5/15) with RER 1.06, VO2 max 18.5, VE/VCO2 slope 32.1.  Moderate to severe functional limitation, circulatory.   In 11/17 was admitted to [facility] with syncopal episode. ICD interrogation was negative.   At last visit we increased Entresto to 97/103.  Felt lightheaded and had stomach discomfort so he cut back. Otherwise says he feels good. Can do anything he wants if he goes at a reasonable pace. Can walk up hills and go [place] hunting and fishing.  No CP, orthopnea or PND. Taking torsemide 20 daily. Weight stable. No longer smoking. Dipping occasionally.   ECHO 04/2014 EF 10% RV mildly dilated.   ECHO 2/18 EF 15% Mild Richard RV mild HK  CPX 09/17/16 Resting HR: 86 Peak HR: 150  (85% age predicted max HR) BP rest: 96/80 BP peak: 118/72 Peak VO2: 20.5 (53% predicted peak VO2) Ve/VCO2 slope: 35 OUES: 1.38 Peak RER: 1.04 Ventilatory Threshold: 15.7 (41% predicted or measured peak VO2) Peak RR 32 Peak Ventilation: 47.5 VE/MVV: 50% PETCO2 at peak: 30 O2pulse: 8  (62% predicted O2pulse)  CPX 5/15 Resting HR: 89 Peak HR: 138  (77% age predicted max HR) BP rest: 106/68 BP peak: 126/74 (IPE) Peak VO2: 18.5 (46.5% predicted peak VO2) VE/VCO2 slope: 32.1 OUES: 1.33 Peak RER: 1.06 Ventilatory Threshold: 13.5 (33.9% predicted peak VO2) Peak RR 31 Peak Ventilation: 37.1 VE/MVV: 62.7% PETCO2 at peak: 33 O2pulse: 7  (58% predicted O2pulse)  Labs  (3/15): K 3.9 Creatinine 0.7 Labs (5/15): K 5.4, creatinine 0.81, digoxin 0.6 Labs (11/12/13) K 4.4 Creatinine 0.81   SH: Lives with his son 45 year old . Unemployed. Disability pending. Drinks one-two beers occasionally. Former smoker quit January 2015. Dips tobacco  FH: Grandfather MI  ROS: All systems negative except as listed in HPI, PMH and Problem List.  Past Medical History:  Diagnosis Date  . Arthritis   . Cardiomyopathy, nonischemic (HCC)    takes Digoxin daily and Carvedilol daily  . CHF (congestive heart failure) (HCC)    takes Lasix daily as well Aldactone  . Chronic back pain   . Chronic systolic heart failure ([place])   . Depression    takes Prozac daily  . GERD (gastroesophageal reflux disease)    takes Omeprazole daily  . Hx of Alcohol consumption heavy    rare beer currently  . hx of Tobacco abuse    quit smoking 2014  . Hyperlipidemia    was on Simvastatin but has been off a year  . Insomnia    takes Ambien as needed(just got script yesterday)  . Orthostatic hypotension   . Scoliosis   . Shortness of breath    lying flat and with exertion  . 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 47 (XRT and chemo)    Current Outpatient Prescriptions  Medication Sig Dispense Refill  . aspirin 81 MG tablet Take 81 mg by mouth daily.      Marland Kitchen  carvedilol (COREG) 3.125 MG tablet Take 1 tablet (3.125 mg total) by mouth 2 (two) times daily with a meal. 180 tablet 2  . Multiple Vitamin (MULTIVITAMIN WITH MINERALS) TABS tablet Take 1 tablet by mouth daily.    Marland Kitchen oxyCODONE (OXY IR/ROXICODONE) 5 MG immediate release tablet Take 1 tablet (5 mg total) by mouth 3 (three) times daily as needed for severe pain. 60 tablet 0  . potassium chloride SA (K-DUR,KLOR-CON) 20 MEQ tablet Take 1 tablet (20 mEq total) by mouth 2 (two) times daily. 60 tablet 1  . sacubitril-valsartan (ENTRESTO) 49-51 MG Take 1 tablet by mouth 2 (two) times daily.    Marland Kitchen torsemide (DEMADEX) 20 MG tablet Take 20 mg by mouth daily.      No current facility-administered medications for this encounter.      PHYSICAL EXAM: Vitals:   10/03/16 0930  BP: 122/80  Pulse: 83  SpO2: 100%  Weight: 132 lb 8 oz (60.1 kg)   Filed Weights   10/03/16 0930  Weight: 132 lb 8 oz (60.1 kg)   General:  Well appearing. No resp difficulty HEENT: normal x poor dentition Neck: supple. JVP 8-9. Carotids 2+ bilat; no bruits. No lymphadenopathy or thryomegaly appreciated. Cor: PMI nondisplaced. Regular rate & rhythm. 2/6 TR Lungs: mildly decreased. Clear  Abdomen: soft, nontender, nondistended. No hepatosplenomegaly. No bruits or masses. Good bowel sounds. Extremities: no cyanosis, clubbing, rash, edema Neuro: alert & orientedx3, cranial nerves grossly intact. moves all 4 extremities w/o difficulty. Affect pleasant   ASSESSMENT & PLAN: 1. Chronic Systolic HF:  Nonischemic cardiomyopathy, ECHO 03/2014 EF 10%. Echo 2/18 EF 15% RV mild HK  [employer] ICD.  CPX test reviewed personally. Shows moderate functional impairment improved from 2015. --Overall doing well. NYHA II. Volume status up just a little. Can take extra torsemide as needed --Continue entresto to 49/61. Unable to tolerate 97/103 --Continue torsemide 20 daily  --Continue carvedilol 3.125 bid --Unlikely to tolerate spiro with low BP and previous hyperK+. Can consider trying 12.5 down the road.  -- Repeat CPX reviewed with him. pVO2 ~ 50% predicted.Improved from previous. We discussed the fact that he continues to do well despite his severe cardiomyopathy. Will continue current therapy for now but at some point will likely need advanced therapies. I will see every 3 months. Call me immediately if getting worse.  2. Severe TR -- Will repeat echo at next visit to re-evaluate in setting of improved volume status.    Richard Bickers MD 10/03/2016

## 2016-10-03 NOTE — Addendum Note (Signed)
Encounter addended by: Scarlette Calico, RN on: 10/03/2016 10:54 AM<BR>    Actions taken: Order list changed

## 2016-10-03 NOTE — Patient Instructions (Signed)
Your physician recommends that you schedule a follow-up appointment in: 2-3 months with echocardiogram

## 2016-10-03 NOTE — Addendum Note (Signed)
Encounter addended by: Scarlette Calico, RN on: 10/03/2016 10:39 AM<BR>    Actions taken: Order list changed, Diagnosis association updated, Sign clinical note

## 2016-10-08 ENCOUNTER — Encounter: Payer: Self-pay | Admitting: Family Medicine

## 2016-10-11 ENCOUNTER — Encounter (HOSPITAL_COMMUNITY): Payer: Self-pay | Admitting: *Deleted

## 2016-10-11 ENCOUNTER — Emergency Department (HOSPITAL_COMMUNITY)
Admission: EM | Admit: 2016-10-11 | Discharge: 2016-10-11 | Disposition: A | Discharge status: Home / Self Care | Payer: Medicare HMO | Attending: Emergency Medicine | Admitting: Emergency Medicine

## 2016-10-11 ENCOUNTER — Emergency Department (HOSPITAL_COMMUNITY): Payer: Medicare HMO

## 2016-10-11 DIAGNOSIS — Z7982 Long term (current) use of aspirin: Secondary | ICD-10-CM | POA: Diagnosis not present

## 2016-10-11 DIAGNOSIS — R079 Chest pain, unspecified: Secondary | ICD-10-CM | POA: Diagnosis not present

## 2016-10-11 DIAGNOSIS — I5022 Chronic systolic (congestive) heart failure: Secondary | ICD-10-CM | POA: Diagnosis not present

## 2016-10-11 DIAGNOSIS — Z87891 Personal history of nicotine dependence: Secondary | ICD-10-CM | POA: Insufficient documentation

## 2016-10-11 DIAGNOSIS — M79602 Pain in left arm: Secondary | ICD-10-CM | POA: Diagnosis not present

## 2016-10-11 DIAGNOSIS — Z79899 Other long term (current) drug therapy: Secondary | ICD-10-CM | POA: Diagnosis not present

## 2016-10-11 DIAGNOSIS — R0789 Other chest pain: Secondary | ICD-10-CM | POA: Insufficient documentation

## 2016-10-11 DIAGNOSIS — Z95810 Presence of automatic (implantable) cardiac defibrillator: Secondary | ICD-10-CM | POA: Insufficient documentation

## 2016-10-11 LAB — CBC
HEMATOCRIT: 40.9 % (ref 39.0–52.0)
HEMOGLOBIN: 13.9 g/dL (ref 13.0–17.0)
MCH: 31 pg (ref 26.0–34.0)
MCHC: 34 g/dL (ref 30.0–36.0)
MCV: 91.1 fL (ref 78.0–100.0)
Platelets: 210 10*3/uL (ref 150–400)
RBC: 4.49 MIL/uL (ref 4.22–5.81)
RDW: 14.5 % (ref 11.5–15.5)
WBC: 3.9 10*3/uL — AB (ref 4.0–10.5)

## 2016-10-11 LAB — BASIC METABOLIC PANEL
Anion gap: 12 (ref 5–15)
BUN: 17 mg/dL (ref 6–20)
CO2: 22 mmol/L (ref 22–32)
Calcium: 8.8 mg/dL — ABNORMAL LOW (ref 8.9–10.3)
Chloride: 97 mmol/L — ABNORMAL LOW (ref 101–111)
Creatinine, Ser: 0.93 mg/dL (ref 0.61–1.24)
GFR calc non Af Amer: >60 mL/min (ref >60)
Glucose, Bld: 99 mg/dL (ref 65–99)
POTASSIUM: 3 mmol/L — AB (ref 3.5–5.1)
SODIUM: 131 mmol/L — AB (ref 135–145)

## 2016-10-11 LAB — I-STAT TROPONIN, ED
TROPONIN I, POC: 0.02 ng/mL (ref 0.00–0.08)
Troponin i, poc: 0.02 ng/mL (ref 0.00–0.08)

## 2016-10-11 MED ORDER — HYDROCODONE-ACETAMINOPHEN 5-325 MG PO TABS
1.0000 | ORAL_TABLET | Freq: Once | ORAL | Status: AC
  Administered 2016-10-11: 1 via ORAL
  Filled 2016-10-11: qty 1

## 2016-10-11 MED ORDER — FENTANYL CITRATE (PF) 100 MCG/2ML IJ SOLN
25.0000 ug | Freq: Once | INTRAMUSCULAR | Status: AC
  Administered 2016-10-11: 25 ug via INTRAVENOUS
  Filled 2016-10-11: qty 2

## 2016-10-11 MED ORDER — CYCLOBENZAPRINE HCL 10 MG PO TABS: 10.0000 mg | ORAL_TABLET | Freq: Three times a day (TID) | ORAL | 0 refills | Status: DC | PRN

## 2016-10-11 MED ORDER — POTASSIUM CHLORIDE CRYS ER 20 MEQ PO TBCR
40.0000 meq | EXTENDED_RELEASE_TABLET | Freq: Once | ORAL | Status: AC
  Administered 2016-10-11: 40 meq via ORAL
  Filled 2016-10-11: qty 2

## 2016-10-11 NOTE — ED Provider Notes (Signed)
[facility] DEPT Provider Note   CSN: [id number] Arrival date & time: 10/11/16  0524     History   Chief Complaint Chief Complaint  Patient presents with  . Chest Pain    HPI [name] is a 46 y.o. male.  HPI 46 year old Caucasian male past medical history significant for nonischemic cardiomyopathy, CHF, GERD, hypertension, depression that presents to the ED today with complaints of left sided chest pain that radiates to the left arm and down to the left hand. States this been ongoing for the past 2 days has worsened. Was able to sleep last night due to the pain. Movement makes the pain worse. States that he mowed 2 yard yesterday which may have exacerbated the problem. Denies any chest pain with exertion. Denies any shortness of breath, diaphoresis, nausea, emesis. Patient's last echo with EF of 15% and 07/2016. Patient had left heart cath in 2012 with normal coronaries. Patient denies any fever, chills, cough, paresthesias, nausea, emesis, abdominal pain, shortness of breath. Denies any history of DVT, lower extremity edema, calf tenderness, prolonged immobilization, recent hospitalizations/surgeries.  Past Medical History:  Diagnosis Date  . Arthritis   . Cardiomyopathy, nonischemic (HCC)    takes Digoxin daily and Carvedilol daily  . CHF (congestive heart failure) (HCC)    takes Lasix daily as well Aldactone  . Chronic back pain   . Chronic systolic heart failure ([place])   . Depression    takes Prozac daily  . GERD (gastroesophageal reflux disease)    takes Omeprazole daily  . Hx of Alcohol consumption heavy    rare beer currently  . hx of Tobacco abuse    quit smoking 2014  . Hyperlipidemia    was on Simvastatin but has been off a year  . Insomnia    takes Ambien as needed(just got script yesterday)  . Orthostatic hypotension   . Scoliosis   . Shortness of breath    lying flat and with exertion  . 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 60 (XRT and chemo)    Patient  Active Problem List   Diagnosis Date Noted  . Tobacco abuse   . Alcohol abuse 10/19/2015  . Cirrhosis of liver ([place]) 09/21/2015  . Erectile dysfunction 03/18/2015  . Chronic pain syndrome 09/15/2014  . Insomnia 09/07/2013  . Chronic CHF (congestive heart failure) ([place]) 08/25/2013  . Paresthesia of hand 11/12/2012  . Hyperlipidemia 08/12/2012  . Glucose intolerance (impaired glucose tolerance) 06/29/2012  . Scoliosis 01/01/2012  . H/O compression fracture of spine 01/01/2012  . Back pain 10/22/2011  . Neck pain 10/22/2011  . 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/30/2011  . Chronic systolic congestive heart failure ([place]) 08/30/2011  . Depression 07/25/2011  . Laboratory test 06/20/2011  . Wilm's tumor   . Nonischemic dilated cardiomyopathy ([place]) 11/09/2010    Past Surgical History:  Procedure Laterality Date  . CARPAL TUNNEL RELEASE Right 01/09/2013   Procedure: CARPAL TUNNEL RELEASE;  Surgeon: Winfield Cunas, MD;  Location: [facility] ORS;  Service: Neurosurgery;  Laterality: Right;  RIGHT carpal tunnel release  . CARPAL TUNNEL RELEASE Left 01/30/2013   Procedure: LEFT CARPAL TUNNEL RELEASE;  Surgeon: Winfield Cunas, MD;  Location: [facility] NEURO ORS;  Service: Neurosurgery;  Laterality: Left;  LEFT Carpal Tunnel release  . CHEST TUBE INSERTION Right 09/09/2013   Procedure: INSERTION PLEURAL DRAINAGE CATHETER;  Surgeon: Ivin Poot, MD;  Location: [facility];  Service: Thoracic;  Laterality: Right;  . ICD  05/25/2011  [employer] End[employer] SG lead/[employer]nergen single chamber device  . IMPLANTABLE CARDIOVERTER DEFIBRILLATOR IMPLANT N/A 05/25/2011   Procedure: IMPLANTABLE CARDIOVERTER DEFIBRILLATOR IMPLANT;  Surgeon: Evans Lance, MD;  Location: [facility] CATH LAB;  Service: Cardiovascular;  Laterality: N/A;  . NEPHRECTOMY  36 yrs ago   Wilms Tumor  . ULNAR NERVE TRANSPOSITION Right 01/09/2013   Procedure: ULNAR NERVE DECOMPRESSION/TRANSPOSITION;  Surgeon: Winfield Cunas, MD;  Location: [facility] NEURO ORS;  Service: Neurosurgery;  Laterality: Right;  RIGHT ulnar nerve decompression       Home Medications    Prior to Admission medications   Medication Sig Start Date End Date Taking? Authorizing Provider  aspirin 81 MG tablet Take 81 mg by mouth daily.     Yes Historical Provider, MD  carvedilol (COREG) 3.125 MG tablet Take 1 tablet (3.125 mg total) by mouth 2 (two) times daily with a meal. 07/09/16  Yes Jolaine Artist, MD  Multiple Vitamin (MULTIVITAMIN WITH MINERALS) TABS tablet Take 1 tablet by mouth daily.   Yes Historical Provider, MD  potassium chloride SA (K-DUR,KLOR-CON) 20 MEQ tablet Take 1 tablet (20 mEq total) by mouth 2 (two) times daily. 04/25/16  Yes Renee Dyane Dustman, PA-C  sacubitril-valsartan (ENTRESTO) 49-51 MG Take 1 tablet by mouth 2 (two) times daily. 10/03/16  Yes Jolaine Artist, MD  torsemide (DEMADEX) 20 MG tablet Take 20 mg by mouth daily.   Yes Historical Provider, MD  oxyCODONE (OXY IR/ROXICODONE) 5 MG immediate release tablet Take 1 tablet (5 mg total) by mouth 3 (three) times daily as needed for severe pain. Patient not taking: Reported on 10/11/2016 09/24/16   Alycia Rossetti, MD    Family History Family History  Problem Relation Age of Onset  . Diabetes Mother     Social History Social History  Substance Use Topics  . Smoking status: Former Smoker    Packs/day: 0.25    Years: 20.00    Types: Cigarettes    Quit date: 09/07/2013  . Smokeless tobacco: Current User    Types: Snuff, Chew     Comment: quit smoking the 1st of Jan 2015. dip snuff  . Alcohol use 0.0 oz/week     Comment: histoorically a heavy drinker ("beer only")     Allergies   Ibuprofen and Nsaids   Review of Systems Review of Systems  Constitutional: Negative for chills and fever.  HENT: Negative for congestion.   Eyes: Negative for visual disturbance.  Respiratory: Negative for cough and shortness of breath.   Cardiovascular: Positive  for chest pain.  Gastrointestinal: Negative for abdominal pain, diarrhea, nausea and vomiting.  Skin: Negative for rash.  Neurological: Negative for dizziness, syncope, weakness, light-headedness, numbness and headaches.     Physical Exam Updated Vital Signs BP 100/78 (BP Location: Right Arm)   Pulse 72   Temp 97.4 F (36.3 C) (Oral)   Resp 15   Ht 5\' 9"  (1.753 m)   Wt 59.9 kg   SpO2 96%   BMI 19.49 kg/m   Physical Exam  Constitutional: He is oriented to person, place, and time. He appears well-developed and well-nourished. No distress.  Nontoxic-appearing.  HENT:  Head: Normocephalic and atraumatic.  Mouth/Throat: Oropharynx is clear and moist.  Eyes: Conjunctivae are normal. Right eye exhibits no discharge. Left eye exhibits no discharge. No scleral icterus.  Neck: Normal range of motion. Neck supple. No thyromegaly present.  Cardiovascular: Normal rate, regular rhythm, normal heart sounds and intact distal pulses.  Exam reveals no  gallop and no friction rub.   No murmur heard. Pulmonary/Chest: Effort normal and breath sounds normal. No respiratory distress. He has no wheezes. He has no rales.  Abdominal: Soft. Bowel sounds are normal. He exhibits no distension. There is no tenderness.  Musculoskeletal: Normal range of motion.  Range of motion makes the pain in his left shoulder worse. Tender from the left clavicle, left trapezius down to the left deltoid. Tender to palpation of left chest wall. Radial pulses 2+ bilaterally. Sensation intact sharp/dull. Cap refill normal.  Lymphadenopathy:    He has no cervical adenopathy.  Neurological: He is alert and oriented to person, place, and time.  Skin: Skin is warm and dry. Capillary refill takes less than 2 seconds.  Nursing note and vitals reviewed.    ED Treatments / Results  Labs (all labs ordered are listed, but only abnormal results are displayed) Labs Reviewed  BASIC METABOLIC PANEL - Abnormal; Notable for the  following:       Result Value   Sodium 131 (*)    Potassium 3.0 (*)    Chloride 97 (*)    Calcium 8.8 (*)    All other components within normal limits  CBC - Abnormal; Notable for the following:    WBC 3.9 (*)    All other components within normal limits  I-STAT TROPOININ, ED  I-STAT TROPOININ, ED    EKG  EKG Interpretation  Date/Time:  Thursday Oct 11 2016 05:38:31 EDT Ventricular Rate:  82 PR Interval:    QRS Duration: 125 QT Interval:  415 QTC Calculation: 485 R Axis:   134 Text Interpretation:  Sinus rhythm Left atrial enlargement Nonspecific intraventricular conduction delay ST elev, probable normal early repol pattern Confirmed by DELO  MD, DOUGLAS ([id number]) on 10/11/2016 6:06:29 AM Also confirmed by Stark Jock  MD, DOUGLAS ([id number]), editor Verna Czech [phone])  on 10/11/2016 7:06:10 AM       Radiology Dg Chest 2 View  Result Date: 10/11/2016 CLINICAL DATA:  Left-sided chest pain extending down the upper extremity for 3 days. 06/19/2015 EXAM: CHEST  2 VIEW COMPARISON:  None. FINDINGS: Intact appearances of the transvenous cardiac lead. Unchanged mild cardiomegaly. Stable linear scarring in the bases. Stable chronic blunting of the right lateral costophrenic angle. No consolidation. No effusion. Normal pulmonary vasculature. Unremarkable hilar and mediastinal contours. IMPRESSION: Stable cardiomegaly and chronic scarring in the bases. No consolidation or effusion. Electronically Signed   By: Andreas Newport M.D.   On: 10/11/2016 06:48    Procedures Procedures (including critical care time)  Medications Ordered in ED Medications  potassium chloride SA (K-DUR,KLOR-CON) CR tablet 40 mEq (40 mEq Oral Given 10/11/16 0719)  HYDROcodone-acetaminophen (NORCO/VICODIN) 5-325 MG per tablet 1 tablet (1 tablet Oral Given 10/11/16 0751)  fentaNYL (SUBLIMAZE) injection 25 mcg (25 mcg Intravenous Given 10/11/16 0752)     Initial Impression / Assessment and Plan / ED Course  I have reviewed the  triage vital signs and the nursing notes.  Pertinent labs & imaging results that were available during my care of the patient were reviewed by me and considered in my medical decision making (see chart for details).     Patient resents to the ED with complaints of left-sided chest pain that radiates to the left shoulder and arm. Chest pain is intermittent and shooting down his left arm. This seems very atypical for ACS. Presentation is unCONCERNING FOR PE. Negative delta troponins. EKG without any significant changes from prior as reviewed by myself and Dr. Wilson Singer.  Chest x-ray shows no acute abnormalities. Blood work is reassuring. Hypokalemia noted. Patient is on oral potassium. Encouraged to continue his potassium and recheck with PCP. Vital signs remained stable. Patient given pain medicine with significant improvement in his symptoms. Suspect this is from a cervical radiculopathy doubt ACS given that patient had normal left heart cath in 2012. Patient will need close follow-up with cardiology. Pt is hemodynamically stable, in NAD, & able to ambulate in the ED. Pain has been managed & has no complaints prior to dc. Pt is comfortable with above plan and is stable for discharge at this time. All questions were answered prior to disposition. Strict return precautions for f/u to the ED were discussed. Patient was seen and discussed with Dr. Wilson Singer who is agreeable with the above plan.   Final Clinical Impressions(s) / ED Diagnoses   Final diagnoses:  Nonspecific chest pain  Left arm pain    New Prescriptions New Prescriptions   No medications on file     Doristine Devoid, PA-C 10/11/16 1026    Virgel Manifold, MD 10/15/16 [phone]

## 2016-10-11 NOTE — Discharge Instructions (Signed)
All of your workup has been reassuring and normal. This may be due to nerve pain. Use a muscle relaxer as needed for sleep. Continue taking her potassium supplements. Make sure follow-up with her cardiology and PCP. Need to return the ED if he develops any worsening chest pain, shortness of breath, fevers or for any other reason.

## 2016-10-11 NOTE — ED Provider Notes (Signed)
Medical screening examination/treatment/procedure(s) were conducted as a shared visit with non-physician practitioner(s) and myself.  I personally evaluated the patient during the encounter.   EKG Interpretation  Date/Time:  Thursday Oct 11 2016 05:38:31 EDT Ventricular Rate:  82 PR Interval:    QRS Duration: 125 QT Interval:  415 QTC Calculation: 485 R Axis:   134 Text Interpretation:  Sinus rhythm Left atrial enlargement Nonspecific intraventricular conduction delay ST elev, probable normal early repol pattern Confirmed by DELO  MD, DOUGLAS ([id number]) on 10/11/2016 6:06:29 AM Also confirmed by Stark Jock  MD, DOUGLAS ([id number]), editor Verna Czech [phone])  on 10/11/2016 7:06:10 AM      45yM with LUE pain. I suspect this is from cervical radiculopathy. Intermittent sharp, shooting pain from L neck/trap down into hand. Very atypical for ACS. He remains very active despite NICM. He mowed two yards yesterday and felt fine. He had LHC in 2012 with normal coronaries. On exam he is in NAD. NVI. EKG w/o significant change form last. Anticipate discharge assuming blood work and CXR w/o concerning findings.    Virgel Manifold, MD 10/11/16 [phone]

## 2016-10-11 NOTE — ED Notes (Signed)
Patient c/o left anterior chest pain onset several days ago states the pain radiating into his left arm. No change with movement. States the pain is off and on. Denies sob. States he has a history of CHF and has had chest pain in the past.

## 2016-10-24 ENCOUNTER — Telehealth: Payer: Self-pay | Admitting: *Deleted

## 2016-10-24 MED ORDER — OXYCODONE HCL 5 MG PO TABS: 5.0000 mg | ORAL_TABLET | Freq: Three times a day (TID) | ORAL | 0 refills | Status: DC | PRN

## 2016-10-24 NOTE — Telephone Encounter (Signed)
Prescription printed and patient made aware to come to office to pick up on 10/24/2016 per VM.

## 2016-10-24 NOTE — Telephone Encounter (Signed)
Received call from patient.   Requested refill on Oxycodone.   Ok to refill??  Last office visit 08/22/2016.  Last refill 09/24/2016.

## 2016-10-24 NOTE — Telephone Encounter (Signed)
okay

## 2016-11-23 ENCOUNTER — Telehealth: Payer: Self-pay | Admitting: *Deleted

## 2016-11-23 MED ORDER — OXYCODONE HCL 5 MG PO TABS: 5.0000 mg | ORAL_TABLET | Freq: Three times a day (TID) | ORAL | 0 refills | Status: DC | PRN

## 2016-11-23 NOTE — Telephone Encounter (Signed)
Okay to refill? 

## 2016-11-23 NOTE — Telephone Encounter (Signed)
Received call from patient.   Requested refill on Oxycodone.   Ok to refill??  Last office visit 08/22/2016.  Last refill 10/24/2016.

## 2016-11-23 NOTE — Telephone Encounter (Signed)
Prescription printed and patient made aware to come to office to pick up after 3pm on 11/23/2016.

## 2016-12-05 ENCOUNTER — Ambulatory Visit (HOSPITAL_COMMUNITY)
Admission: RE | Admit: 2016-12-05 | Discharge: 2016-12-05 | Disposition: A | Discharge status: Home / Self Care | Payer: Medicare HMO | Source: Ambulatory Visit | Attending: Family Medicine | Admitting: Family Medicine

## 2016-12-05 ENCOUNTER — Encounter (HOSPITAL_COMMUNITY): Payer: Self-pay | Admitting: Internal Medicine

## 2016-12-05 ENCOUNTER — Ambulatory Visit (HOSPITAL_BASED_OUTPATIENT_CLINIC_OR_DEPARTMENT_OTHER)
Admission: RE | Admit: 2016-12-05 | Discharge: 2016-12-05 | Disposition: A | Discharge status: Home / Self Care | Payer: Medicare HMO | Source: Ambulatory Visit | Attending: Internal Medicine | Admitting: Internal Medicine

## 2016-12-05 VITALS — BP 122/88 | HR 81 | Wt 129.8 lb

## 2016-12-05 DIAGNOSIS — E785 Hyperlipidemia, unspecified: Secondary | ICD-10-CM | POA: Diagnosis not present

## 2016-12-05 DIAGNOSIS — C649 Malignant neoplasm of unspecified kidney, except renal pelvis: Secondary | ICD-10-CM | POA: Diagnosis not present

## 2016-12-05 DIAGNOSIS — Z905 Acquired absence of kidney: Secondary | ICD-10-CM | POA: Insufficient documentation

## 2016-12-05 DIAGNOSIS — Z85528 Personal history of other malignant neoplasm of kidney: Secondary | ICD-10-CM | POA: Insufficient documentation

## 2016-12-05 DIAGNOSIS — M419 Scoliosis, unspecified: Secondary | ICD-10-CM | POA: Insufficient documentation

## 2016-12-05 DIAGNOSIS — I429 Cardiomyopathy, unspecified: Secondary | ICD-10-CM | POA: Insufficient documentation

## 2016-12-05 DIAGNOSIS — I071 Rheumatic tricuspid insufficiency: Secondary | ICD-10-CM | POA: Diagnosis not present

## 2016-12-05 DIAGNOSIS — I081 Rheumatic disorders of both mitral and tricuspid valves: Secondary | ICD-10-CM | POA: Insufficient documentation

## 2016-12-05 DIAGNOSIS — F329 Major depressive disorder, single episode, unspecified: Secondary | ICD-10-CM | POA: Insufficient documentation

## 2016-12-05 DIAGNOSIS — K219 Gastro-esophageal reflux disease without esophagitis: Secondary | ICD-10-CM | POA: Insufficient documentation

## 2016-12-05 DIAGNOSIS — I5022 Chronic systolic (congestive) heart failure: Secondary | ICD-10-CM | POA: Diagnosis not present

## 2016-12-05 DIAGNOSIS — G8929 Other chronic pain: Secondary | ICD-10-CM | POA: Insufficient documentation

## 2016-12-05 DIAGNOSIS — Z95810 Presence of automatic (implantable) cardiac defibrillator: Secondary | ICD-10-CM | POA: Diagnosis not present

## 2016-12-05 DIAGNOSIS — I371 Nonrheumatic pulmonary valve insufficiency: Secondary | ICD-10-CM | POA: Insufficient documentation

## 2016-12-05 DIAGNOSIS — Z87891 Personal history of nicotine dependence: Secondary | ICD-10-CM | POA: Diagnosis not present

## 2016-12-05 DIAGNOSIS — M549 Dorsalgia, unspecified: Secondary | ICD-10-CM | POA: Diagnosis not present

## 2016-12-05 DIAGNOSIS — G4700 Insomnia, unspecified: Secondary | ICD-10-CM | POA: Insufficient documentation

## 2016-12-05 DIAGNOSIS — Z7982 Long term (current) use of aspirin: Secondary | ICD-10-CM | POA: Insufficient documentation

## 2016-12-05 LAB — BASIC METABOLIC PANEL
Anion gap: 7 (ref 5–15)
BUN: 12 mg/dL (ref 6–20)
CHLORIDE: 98 mmol/L — AB (ref 101–111)
CO2: 32 mmol/L (ref 22–32)
CREATININE: 0.98 mg/dL (ref 0.61–1.24)
Calcium: 9.9 mg/dL (ref 8.9–10.3)
GFR calc Af Amer: >60 mL/min (ref >60)
GFR calc non Af Amer: >60 mL/min (ref >60)
GLUCOSE: 164 mg/dL — AB (ref 65–99)
Potassium: 4 mmol/L (ref 3.5–5.1)
Sodium: 137 mmol/L (ref 135–145)

## 2016-12-05 MED ORDER — SPIRONOLACTONE 25 MG PO TABS: 12.5000 mg | ORAL_TABLET | Freq: Every day | ORAL | 3 refills | Status: DC

## 2016-12-05 MED ORDER — DIGOXIN 125 MCG PO TABS: 0.1250 mg | ORAL_TABLET | Freq: Every day | ORAL | 3 refills | Status: DC

## 2016-12-05 NOTE — Progress Notes (Signed)
Patient ID: [name], male   DOB: May 28, 1971, 46 y.o.   MRN: [id number]    Primary Cardiologist: Dr [name]  HPI: [name] is 46 year old with history of NICM (diagnosed 10/2010 - norm cors 4656), chronic systolic heart failure EF 15% (08/2013), S/P ICD [employer], smoker, Wilms tumor s/p nephrectomy at age 69 had chemoradiation, scoliosis.    We saw [name] at the end of March 2015 and had large recurrent R pleural effusion. Referred to Dr. Prescott [name] who placed Pleurex catheter on 4/10, removed on 09/28/13.   CPX (5/15) with RER 1.06, VO2 max 18.5, VE/VCO2 slope 32.1.  Moderate to severe functional limitation, circulatory.   In 11/17 was admitted to [facility] with syncopal episode. ICD interrogation was negative.   Earlier this year we increased Entresto to 35/103.  Felt lightheaded and had stomach discomfort so [name] cut back.   Seen in ER in 5/18 with arm shoulder pain. Troponin ok. Improved with muscle relaxants  Here for routine f/u. Continue to feel "great'. Can do anything [name] wants if [name] goes at a reasonable pace. Can walk up hills and go [place] hunting and fishing. Works in the garden in the mornings. Takes occasional breaks.  No CP, orthopnea or PND. Taking torsemide 20 daily. Occasional orthostasis if [name] gets up too quickly  Weight stable. No longer smoking. Dipping occasionally.   ECHO 04/2014 EF 10% RV mildly dilated.   ECHO 2/18 EF 15% Mild Richard RV mild HK. Severe TR. Triv Richard . RV ok  Echo reviewed personally today EF 15% RV ok. Moderate to severe TR ReDS reading today 41%   CPX 09/17/16 Resting HR: 86 Peak HR: 150  (85% age predicted max HR) BP rest: 96/80 BP peak: 118/72 Peak VO2: 20.5 (53% predicted peak VO2) Ve/VCO2 slope: 35 OUES: 1.38 Peak RER: 1.04 Ventilatory Threshold: 15.7 (41% predicted or measured peak VO2) Peak RR 32 Peak Ventilation: 47.5 VE/MVV: 50% PETCO2 at peak: 30 O2pulse: 8  (62% predicted O2pulse)  CPX 5/15 Resting HR: 89 Peak  HR: 138  (77% age predicted max HR) BP rest: 106/68 BP peak: 126/74 (IPE) Peak VO2: 18.5 (46.5% predicted peak VO2) VE/VCO2 slope: 32.1 OUES: 1.33 Peak RER: 1.06 Ventilatory Threshold: 13.5 (33.9% predicted peak VO2) Peak RR 31 Peak Ventilation: 37.1 VE/MVV: 62.7% PETCO2 at peak: 33 O2pulse: 7  (58% predicted O2pulse)  Labs (3/15): K 3.9 Creatinine 0.7 Labs (5/15): K 5.4, creatinine 0.81, digoxin 0.6 Labs (11/12/13) K 4.4 Creatinine 0.81   SH: Lives with [name] son 37 year old . Unemployed. Disability pending. Drinks one-two beers occasionally. Former smoker quit January 2015. Dips tobacco  FH: Grandfather MI  ROS: All systems negative except as listed in HPI, PMH and Problem List.  Past Medical History:  Diagnosis Date  . Arthritis   . Cardiomyopathy, nonischemic (HCC)    takes Digoxin daily and Carvedilol daily  . CHF (congestive heart failure) (HCC)    takes Lasix daily as well Aldactone  . Chronic back pain   . Chronic systolic heart failure ([place])   . Depression    takes Prozac daily  . GERD (gastroesophageal reflux disease)    takes Omeprazole daily  . Hx of Alcohol consumption heavy    rare beer currently  . hx of Tobacco abuse    quit smoking 2014  . Hyperlipidemia    was on Simvastatin but has been off a year  . Insomnia    takes Ambien as needed(just got script  yesterday)  . Orthostatic hypotension   . Scoliosis   . Shortness of breath    lying flat and with exertion  . 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 47 (XRT and chemo)    Current Outpatient Prescriptions  Medication Sig Dispense Refill  . aspirin 81 MG tablet Take 81 mg by mouth daily.      . carvedilol (COREG) 3.125 MG tablet Take 1 tablet (3.125 mg total) by mouth 2 (two) times daily with a meal. 180 tablet 2  . cyclobenzaprine (FLEXERIL) 10 MG tablet Take 1 tablet (10 mg total) by mouth 3 (three) times daily as needed for muscle spasms. 12 tablet 0  . Multiple Vitamin (MULTIVITAMIN WITH MINERALS)  TABS tablet Take 1 tablet by mouth daily.    Marland Kitchen oxyCODONE (OXY IR/ROXICODONE) 5 MG immediate release tablet Take 1 tablet (5 mg total) by mouth 3 (three) times daily as needed for severe pain. 60 tablet 0  . potassium chloride SA (K-DUR,KLOR-CON) 20 MEQ tablet Take 1 tablet (20 mEq total) by mouth 2 (two) times daily. 60 tablet 1  . sacubitril-valsartan (ENTRESTO) 49-51 MG Take 1 tablet by mouth 2 (two) times daily. 60 tablet 6  . torsemide (DEMADEX) 20 MG tablet Take 20 mg by mouth daily.     No current facility-administered medications for this encounter.      PHYSICAL EXAM: Vitals:   12/05/16 0950  BP: 122/88  Pulse: 81  SpO2: 100%  Weight: 129 lb 12 oz (58.9 kg)   Filed Weights   12/05/16 0950  Weight: 129 lb 12 oz (58.9 kg)   General:  Well appearing. No resp difficulty HEENT: normal Neck: supple. JVP 9-10. Carotids 2+ bilat; no bruits. No lymphadenopathy or thryomegaly appreciated. Cor: PMI laterally displaced. Regular rate & rhythm. 2/6 TR no s3 Lungs: clear Abdomen: soft, nontender, nondistended. No hepatosplenomegaly. No bruits or masses. Good bowel sounds. Extremities: no cyanosis, clubbing, rash, edema Neuro: alert & orientedx3, cranial nerves grossly intact. moves all 4 extremities w/o difficulty. Affect pleasant  ASSESSMENT & PLAN: 1. Chronic Systolic HF:  Nonischemic cardiomyopathy, ECHO 03/2014 EF 10%. Echo 2/18 EF 15% RV mild HK  [employer] ICD.   --Echo today reviewed personally LVEF 15%. RV mild HK moderate -severe TR -- CPX test from 4/18 reviewed again personally. Shows moderate functional impairment improved from 2015. --ReDS reading today 41% --Overall doing well despite persistent severe LV dysfunction. NYHA II. Volume status up a little on exam and by ReDS. Continue torsemide. Add back spiro 12.5 (stop KCL) --Continue entresto to 49/51. Unable to tolerate 97/103 --Continue torsemide 20 daily. Can take extra as needed --Continue carvedilol 3.125  bid --Start digoxin 0.125 -- CPX reviewed with [name] again. pVO2 ~ 50% predicted.Improved from previous. We discussed the fact that [name] continues to do well despite [name] severe cardiomyopathy. Will continue current therapy for now but at some point will likely need advanced therapies - ideally OHTx.Marland Kitchen -- Given small stature and blood type A-pos I think it is very reasonable to start the transplant work-up though it is a bit early. Will refer to Dr. Mosetta Pigeon at [facility].  2. Severe TR -- Echo reviewed today and still with moderate to severe TR but TV seems structurally intact. ? If related to ICD wire or just dilated annulus. Will consider TEE +/- RHC.    Glori Bickers MD 12/05/2016

## 2016-12-05 NOTE — Progress Notes (Signed)
Advanced Heart Failure Medication Review by a Pharmacist  Does the patient  feel that his/her medications are working for him/her?  yes  Has the patient been experiencing any side effects to the medications prescribed?  no  Does the patient measure his/her own blood pressure or blood glucose at home?  no   Does the patient have any problems obtaining medications due to transportation or finances?   no  Understanding of regimen: good Understanding of indications: good Potential of compliance: good Patient understands to avoid NSAIDs. Patient understands to avoid decongestants.  Issues to address at subsequent visits: none.   Pharmacist comments: Mr. Seago is a pleasant 46 y/o male who presents with no medications bottles or list but good recollection of his medication regimen. He endorses good adherence with his medication regimen and patient had no medication-related questions or concerns for me at this time.    Phillis Knack PharmD Candidate  Time with patient: 10 minutes Preparation and documentation time: 5 minutes Total time: 15 minutes

## 2016-12-05 NOTE — Patient Instructions (Signed)
START Digoxin 0.125 mg daily  START Spironolactone 12.5mg  daily  STOP Potassium  Routine lab work today. Will notify you of abnormal results  Repeat labs (bmet) in 1 week at [facility].  Follow up with Dr.[name] in 2 months

## 2016-12-11 ENCOUNTER — Telehealth: Payer: Self-pay | Admitting: *Deleted

## 2016-12-11 NOTE — Telephone Encounter (Signed)
okay

## 2016-12-11 NOTE — Telephone Encounter (Signed)
Records indicate prescription refill appropriate for Oxycodone.   Ok to refill??  Last office visit 08/22/2016.  Last refill 11/24/2079.

## 2016-12-13 MED ORDER — OXYCODONE HCL 5 MG PO TABS: 5.0000 mg | ORAL_TABLET | Freq: Three times a day (TID) | ORAL | 0 refills | Status: DC | PRN

## 2016-12-13 NOTE — Telephone Encounter (Signed)
Prescription printed and patient made aware to come to office to pick up on Monday.  

## 2017-01-24 ENCOUNTER — Other Ambulatory Visit: Payer: Self-pay | Admitting: *Deleted

## 2017-01-24 NOTE — Telephone Encounter (Signed)
Received call from patient.   Requested refill on Oxycodone.   Ok to refill??  Last office visit 08/22/2016.  Last refill 12/13/2016.

## 2017-01-25 MED ORDER — OXYCODONE HCL 5 MG PO TABS: 5.0000 mg | ORAL_TABLET | Freq: Three times a day (TID) | ORAL | 0 refills | Status: DC | PRN

## 2017-01-25 NOTE — Telephone Encounter (Signed)
Rx has been printed pt can pick up after 4pm today LVM

## 2017-01-25 NOTE — Telephone Encounter (Signed)
Okay to refill? 

## 2017-01-30 DIAGNOSIS — Z720 Tobacco use: Secondary | ICD-10-CM | POA: Diagnosis not present

## 2017-01-30 DIAGNOSIS — I428 Other cardiomyopathies: Secondary | ICD-10-CM | POA: Diagnosis not present

## 2017-01-30 DIAGNOSIS — R932 Abnormal findings on diagnostic imaging of liver and biliary tract: Secondary | ICD-10-CM | POA: Diagnosis not present

## 2017-01-30 DIAGNOSIS — Z95810 Presence of automatic (implantable) cardiac defibrillator: Secondary | ICD-10-CM | POA: Diagnosis not present

## 2017-02-06 ENCOUNTER — Encounter (HOSPITAL_COMMUNITY): Payer: Self-pay

## 2017-02-06 ENCOUNTER — Ambulatory Visit (HOSPITAL_COMMUNITY)
Admission: RE | Admit: 2017-02-06 | Discharge: 2017-02-06 | Disposition: A | Discharge status: Home / Self Care | Payer: Medicare HMO | Source: Ambulatory Visit | Attending: Internal Medicine | Admitting: Internal Medicine

## 2017-02-06 ENCOUNTER — Other Ambulatory Visit (HOSPITAL_COMMUNITY): Payer: Self-pay

## 2017-02-06 VITALS — BP 114/72 | HR 96 | Wt 125.2 lb

## 2017-02-06 DIAGNOSIS — K219 Gastro-esophageal reflux disease without esophagitis: Secondary | ICD-10-CM | POA: Insufficient documentation

## 2017-02-06 DIAGNOSIS — Z85528 Personal history of other malignant neoplasm of kidney: Secondary | ICD-10-CM | POA: Diagnosis not present

## 2017-02-06 DIAGNOSIS — R69 Illness, unspecified: Secondary | ICD-10-CM | POA: Diagnosis not present

## 2017-02-06 DIAGNOSIS — Z720 Tobacco use: Secondary | ICD-10-CM | POA: Diagnosis not present

## 2017-02-06 DIAGNOSIS — I5022 Chronic systolic (congestive) heart failure: Secondary | ICD-10-CM | POA: Insufficient documentation

## 2017-02-06 DIAGNOSIS — Z79899 Other long term (current) drug therapy: Secondary | ICD-10-CM | POA: Insufficient documentation

## 2017-02-06 DIAGNOSIS — I428 Other cardiomyopathies: Secondary | ICD-10-CM | POA: Diagnosis not present

## 2017-02-06 DIAGNOSIS — E785 Hyperlipidemia, unspecified: Secondary | ICD-10-CM | POA: Diagnosis not present

## 2017-02-06 DIAGNOSIS — M419 Scoliosis, unspecified: Secondary | ICD-10-CM | POA: Diagnosis not present

## 2017-02-06 DIAGNOSIS — Z7982 Long term (current) use of aspirin: Secondary | ICD-10-CM | POA: Insufficient documentation

## 2017-02-06 DIAGNOSIS — Z95810 Presence of automatic (implantable) cardiac defibrillator: Secondary | ICD-10-CM | POA: Diagnosis not present

## 2017-02-06 DIAGNOSIS — I071 Rheumatic tricuspid insufficiency: Secondary | ICD-10-CM | POA: Insufficient documentation

## 2017-02-06 DIAGNOSIS — F329 Major depressive disorder, single episode, unspecified: Secondary | ICD-10-CM | POA: Insufficient documentation

## 2017-02-06 DIAGNOSIS — I509 Heart failure, unspecified: Secondary | ICD-10-CM

## 2017-02-06 DIAGNOSIS — G4700 Insomnia, unspecified: Secondary | ICD-10-CM | POA: Insufficient documentation

## 2017-02-06 DIAGNOSIS — Z905 Acquired absence of kidney: Secondary | ICD-10-CM | POA: Diagnosis not present

## 2017-02-06 LAB — CBC
HCT: 48 % (ref 39.0–52.0)
HEMOGLOBIN: 16.3 g/dL (ref 13.0–17.0)
MCH: 32.7 pg (ref 26.0–34.0)
MCHC: 34 g/dL (ref 30.0–36.0)
MCV: 96.2 fL (ref 78.0–100.0)
PLATELETS: 228 10*3/uL (ref 150–400)
RBC: 4.99 MIL/uL (ref 4.22–5.81)
RDW: 14.8 % (ref 11.5–15.5)
WBC: 3.8 10*3/uL — AB (ref 4.0–10.5)

## 2017-02-06 LAB — BASIC METABOLIC PANEL
ANION GAP: 10 (ref 5–15)
BUN: 16 mg/dL (ref 6–20)
CALCIUM: 9.6 mg/dL (ref 8.9–10.3)
CHLORIDE: 99 mmol/L — AB (ref 101–111)
CO2: 30 mmol/L (ref 22–32)
CREATININE: 1.1 mg/dL (ref 0.61–1.24)
GFR calc non Af Amer: >60 mL/min (ref >60)
Glucose, Bld: 145 mg/dL — ABNORMAL HIGH (ref 65–99)
Potassium: 3.7 mmol/L (ref 3.5–5.1)
SODIUM: 139 mmol/L (ref 135–145)

## 2017-02-06 LAB — PROTIME-INR
INR: 1.09
PROTHROMBIN TIME: 14 s (ref 11.4–15.2)

## 2017-02-06 MED ORDER — SACUBITRIL-VALSARTAN 49-51 MG PO TABS: 1.0000 | ORAL_TABLET | Freq: Two times a day (BID) | ORAL | 6 refills | Status: DC

## 2017-02-06 NOTE — Patient Instructions (Addendum)
Stop Digoxin  Labs drawn today  Your physician has requested that you have a cardiac catheterization. Cardiac catheterization is used to diagnose and/or treat various heart conditions. Doctors may recommend this procedure for a number of different reasons. The most common reason is to evaluate chest pain. Chest pain can be a symptom of coronary artery disease (CAD), and cardiac catheterization can show whether plaque is narrowing or blocking your heart's arteries. This procedure is also used to evaluate the valves, as well as measure the blood flow and oxygen levels in different parts of your heart. For further information please visit [email or web address]. Please follow instruction sheet, as given.   Your physician recommends that you schedule a follow-up appointment in: 6 weeks

## 2017-02-06 NOTE — Progress Notes (Signed)
Patient ID: [name], male   DOB: 07/03/1970, 46 y.o.   MRN: [id number]    Primary Cardiologist: Dr Domenic Polite HF MD: Dr Haroldine Laws [name]: Dr Mosetta Pigeon  HPI: [name] is 46 year old with history of NICM (diagnosed 10/2010 - norm cors 4696), chronic systolic heart failure EF 15% (08/2013), S/P ICD [employer], smoker, Wilms tumor s/p nephrectomy at age 75 had chemoradiation, scoliosis.    We saw him at the end of March 2015 and had large recurrent R pleural effusion. Referred to Dr. Prescott Gum who placed Pleurex catheter on 4/10, removed on 09/28/13.   CPX (5/15) with RER 1.06, VO2 max 18.5, VE/VCO2 slope 32.1.  Moderate to severe functional limitation, circulatory.   In 11/17 was admitted to [facility] with syncopal episode. ICD interrogation was negative.   Earlier this year we increased Entresto to 53/103.  Felt lightheaded and had stomach discomfort so he cut back.   Seen in ER in 5/18 with arm shoulder pain. Troponin ok. Improved with muscle relaxants  Today he returns for HF follow up. Since the last visit he was evaluated at [facility] by Dr Mosetta Pigeon for possible transplant. Recommendations for RHC. Denies SOB/PND/Orthopnea. Gets tired after walking grocery store. Appetite ok. Weight at home 130 pounds. Taking all medications. Says he is trying stop dipping tobacco.   ECHO 04/2014 EF 10% RV mildly dilated.   ECHO 2/18 EF 15% Mild Richard RV mild HK. Severe TR. Triv Richard . RV ok Echo 11/2016  EF 15% RV ok. Moderate to severe TR  CPX 09/17/16 Resting HR: 86 Peak HR: 150  (85% age predicted max HR) BP rest: 96/80 BP peak: 118/72 Peak VO2: 20.5 (53% predicted peak VO2) Ve/VCO2 slope: 35 OUES: 1.38 Peak RER: 1.04 Ventilatory Threshold: 15.7 (41% predicted or measured peak VO2) Peak RR 32 Peak Ventilation: 47.5 VE/MVV: 50% PETCO2 at peak: 30 O2pulse: 8  (62% predicted O2pulse)  CPX 5/15 Resting HR: 89 Peak HR: 138  (77% age predicted max HR) BP rest: 106/68 BP peak: 126/74  (IPE) Peak VO2: 18.5 (46.5% predicted peak VO2) VE/VCO2 slope: 32.1 OUES: 1.33 Peak RER: 1.06 Ventilatory Threshold: 13.5 (33.9% predicted peak VO2) Peak RR 31 Peak Ventilation: 37.1 VE/MVV: 62.7% PETCO2 at peak: 33 O2pulse: 7  (58% predicted O2pulse)  SH: [name] 46 year old . Unemployed. Disability approved 3 years ago. Does not drink alcohol. Dips tobacco.   FH: Grandfather MI  ROS: All systems negative except as listed in HPI, PMH and Problem List.  Past Medical History:  Diagnosis Date  . Arthritis   . Cardiomyopathy, nonischemic (HCC)    takes Digoxin daily and Carvedilol daily  . CHF (congestive heart failure) (HCC)    takes Lasix daily as well Aldactone  . Chronic back pain   . Chronic systolic heart failure ([place])   . Depression    takes Prozac daily  . GERD (gastroesophageal reflux disease)    takes Omeprazole daily  . Hx of Alcohol consumption heavy    rare beer currently  . hx of Tobacco abuse    quit smoking 2014  . Hyperlipidemia    was on Simvastatin but has been off a year  . Insomnia    takes Ambien as needed(just got script yesterday)  . Orthostatic hypotension   . Scoliosis   . Shortness of breath    lying flat and with exertion  . 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 34 (XRT and chemo)  Current Outpatient Prescriptions  Medication Sig Dispense Refill  . aspirin 81 MG tablet Take 81 mg by mouth daily.      . carvedilol (COREG) 3.125 MG tablet Take 1 tablet (3.125 mg total) by mouth 2 (two) times daily with a meal. 180 tablet 2  . cyclobenzaprine (FLEXERIL) 10 MG tablet Take 1 tablet (10 mg total) by mouth 3 (three) times daily as needed for muscle spasms. 12 tablet 0  . Multiple Vitamin (MULTIVITAMIN WITH MINERALS) TABS tablet Take 1 tablet by mouth daily.    Marland Kitchen oxyCODONE (OXY IR/ROXICODONE) 5 MG immediate release tablet Take 1 tablet (5 mg total) by mouth 3 (three) times daily as needed for severe pain. 60 tablet 0  .  sacubitril-valsartan (ENTRESTO) 49-51 MG Take 1 tablet by mouth 2 (two) times daily. 60 tablet 6  . spironolactone (ALDACTONE) 25 MG tablet Take 0.5 tablets (12.5 mg total) by mouth daily. 45 tablet 3  . torsemide (DEMADEX) 20 MG tablet Take 20 mg by mouth daily.     No current facility-administered medications for this encounter.      PHYSICAL EXAM: Vitals:   02/06/17 1015  BP: 114/72  Pulse: 96  SpO2: 96%  Weight: 125 lb 2.4 oz (56.8 kg)   Filed Weights   02/06/17 1015  Weight: 125 lb 2.4 oz (56.8 kg)   General:  Well appearing. No resp difficulty HEENT: normal Neck: supple. JVP 8-9 prominent CV waves. Carotids 2+ bilat; no bruits. No lymphadenopathy or thryomegaly appreciated. Cor: PMI laterally displaced. Regular rate & rhythm. 2/6 TR no s3 Lungs: clear Abdomen: soft, nontender, nondistended. No hepatosplenomegaly. No bruits or masses. Good bowel sounds. Extremities: no cyanosis, clubbing, rash, edema Neuro: alert & orientedx3, cranial nerves grossly intact. moves all 4 extremities w/o difficulty. Affect pleasant  ASSESSMENT & PLAN: 1. Chronic Systolic HF:  Nonischemic cardiomyopathy, ECHO 03/2014 EF 10%. Echo 2/18 EF 15% RV mild HK  [employer] ICD.   --Echo 11/2016 LVEF 15%. RV mild HK moderate -severe TR -- CPX test from 4/18 reviewed again personally. Shows moderate functional impairment improved from 2015. --Overall doing well despite persistent severe LV dysfunction.  NYHA II. Volume status stable. Continue 20 mg torsemide.  Continue 12.5 mg spiro daily.  --Continue entresto to 49/51. Unable to tolerate 97/103 --Continue torsemide 20 daily. Can take extra as needed --Continue carvedilol 3.125 bid -- Unable to tolerate digoxin due to heartburn -- CPX reviewed with him again. pVO2 ~ 50% predicted.Improved from previous. We discussed the fact that he continues to do well despite his severe cardiomyopathy. Will continue current therapy for now but at some point will  likely need advanced therapies - ideally OHTx.Marland Kitchen -- Given small stature and blood type A-pos  - referred to Dr Mosetta Pigeon at [facility] and was evaluated 8/22 with recommendations for RHC reassess hemodynamics.   2. Severe TR -- Echo last visit moderate to severe TR but TV seems structurally intact. ? If related to ICD wire or just dilated annulus. Will consider TEE   Set up LHC/RHC next week.    Amy Clegg NP-C  02/06/2017  Patient seen and examined with Darrick Grinder, NP. We discussed all aspects of the encounter. I agree with the assessment and plan as stated above.   He is stable NYHA III. Continue current HF meds. Recently seen at [facility] for transplant evaluation. I spoke with Dr. Mosetta Pigeon today and will arrange L/R heart cath next week as part of transplant work-up.   Glori Bickers, MD  10:52 PM

## 2017-02-12 ENCOUNTER — Ambulatory Visit (HOSPITAL_COMMUNITY)
Admission: RE | Admit: 2017-02-12 | Discharge: 2017-02-12 | Disposition: A | Discharge status: Home / Self Care | Payer: Medicare HMO | Source: Ambulatory Visit | Attending: Internal Medicine | Admitting: Internal Medicine

## 2017-02-12 ENCOUNTER — Telehealth (HOSPITAL_COMMUNITY): Payer: Self-pay | Admitting: *Deleted

## 2017-02-12 ENCOUNTER — Encounter (HOSPITAL_COMMUNITY)
Admission: RE | Disposition: A | Discharge status: Home / Self Care | Payer: Self-pay | Source: Ambulatory Visit | Attending: Internal Medicine

## 2017-02-12 DIAGNOSIS — I428 Other cardiomyopathies: Secondary | ICD-10-CM | POA: Diagnosis not present

## 2017-02-12 DIAGNOSIS — G8929 Other chronic pain: Secondary | ICD-10-CM | POA: Insufficient documentation

## 2017-02-12 DIAGNOSIS — G4700 Insomnia, unspecified: Secondary | ICD-10-CM | POA: Insufficient documentation

## 2017-02-12 DIAGNOSIS — Z85528 Personal history of other malignant neoplasm of kidney: Secondary | ICD-10-CM | POA: Insufficient documentation

## 2017-02-12 DIAGNOSIS — I5022 Chronic systolic (congestive) heart failure: Secondary | ICD-10-CM | POA: Diagnosis not present

## 2017-02-12 DIAGNOSIS — M419 Scoliosis, unspecified: Secondary | ICD-10-CM | POA: Diagnosis not present

## 2017-02-12 DIAGNOSIS — I509 Heart failure, unspecified: Secondary | ICD-10-CM

## 2017-02-12 DIAGNOSIS — Z95810 Presence of automatic (implantable) cardiac defibrillator: Secondary | ICD-10-CM | POA: Insufficient documentation

## 2017-02-12 DIAGNOSIS — Z7982 Long term (current) use of aspirin: Secondary | ICD-10-CM | POA: Diagnosis not present

## 2017-02-12 DIAGNOSIS — K219 Gastro-esophageal reflux disease without esophagitis: Secondary | ICD-10-CM | POA: Diagnosis not present

## 2017-02-12 DIAGNOSIS — Z87891 Personal history of nicotine dependence: Secondary | ICD-10-CM | POA: Diagnosis not present

## 2017-02-12 DIAGNOSIS — R69 Illness, unspecified: Secondary | ICD-10-CM | POA: Diagnosis not present

## 2017-02-12 DIAGNOSIS — M1990 Unspecified osteoarthritis, unspecified site: Secondary | ICD-10-CM | POA: Insufficient documentation

## 2017-02-12 DIAGNOSIS — E785 Hyperlipidemia, unspecified: Secondary | ICD-10-CM | POA: Diagnosis not present

## 2017-02-12 DIAGNOSIS — M549 Dorsalgia, unspecified: Secondary | ICD-10-CM | POA: Insufficient documentation

## 2017-02-12 DIAGNOSIS — F329 Major depressive disorder, single episode, unspecified: Secondary | ICD-10-CM | POA: Insufficient documentation

## 2017-02-12 DIAGNOSIS — Z905 Acquired absence of kidney: Secondary | ICD-10-CM | POA: Insufficient documentation

## 2017-02-12 HISTORY — PX: RIGHT/LEFT HEART CATH AND CORONARY ANGIOGRAPHY: CATH118266

## 2017-02-12 LAB — POCT I-STAT 3, ART BLOOD GAS (G3+)
Acid-Base Excess: 1 mmol/L (ref 0.0–2.0)
Bicarbonate: 26 mmol/L (ref 20.0–28.0)
O2 SAT: 95 %
PCO2 ART: 40.6 mmHg (ref 32.0–48.0)
PH ART: 7.415 (ref 7.350–7.450)
PO2 ART: 77 mmHg — AB (ref 83.0–108.0)
TCO2: 27 mmol/L (ref 22–32)

## 2017-02-12 LAB — POCT I-STAT 3, VENOUS BLOOD GAS (G3P V)
Acid-Base Excess: 3 mmol/L — ABNORMAL HIGH (ref 0.0–2.0)
Acid-Base Excess: 4 mmol/L — ABNORMAL HIGH (ref 0.0–2.0)
BICARBONATE: 29.4 mmol/L — AB (ref 20.0–28.0)
BICARBONATE: 30.2 mmol/L — AB (ref 20.0–28.0)
O2 Saturation: 68 %
O2 Saturation: 71 %
PCO2 VEN: 49.7 mmHg (ref 44.0–60.0)
PH VEN: 7.392 (ref 7.250–7.430)
PH VEN: 7.395 (ref 7.250–7.430)
PO2 VEN: 36 mmHg (ref 32.0–45.0)
PO2 VEN: 38 mmHg (ref 32.0–45.0)
TCO2: 31 mmol/L (ref 22–32)
TCO2: 32 mmol/L (ref 22–32)
pCO2, Ven: 47.9 mmHg (ref 44.0–60.0)

## 2017-02-12 SURGERY — RIGHT/LEFT HEART CATH AND CORONARY ANGIOGRAPHY
Anesthesia: LOCAL

## 2017-02-12 MED ORDER — SODIUM CHLORIDE 0.9 % IV SOLN: 250.0000 mL | INTRAVENOUS | Status: DC | PRN

## 2017-02-12 MED ORDER — SODIUM CHLORIDE 0.9% FLUSH: 3.0000 mL | Freq: Two times a day (BID) | INTRAVENOUS | Status: DC

## 2017-02-12 MED ORDER — SODIUM CHLORIDE 0.9 % IV SOLN
INTRAVENOUS | Status: DC
  Administered 2017-02-12: 08:00:00 via INTRAVENOUS

## 2017-02-12 MED ORDER — MIDAZOLAM HCL 2 MG/2ML IJ SOLN
INTRAMUSCULAR | Status: AC
Start: 2017-02-12 — End: ?
  Filled 2017-02-12: qty 2

## 2017-02-12 MED ORDER — ACETAMINOPHEN 325 MG PO TABS: 650.0000 mg | ORAL_TABLET | ORAL | Status: DC | PRN

## 2017-02-12 MED ORDER — FENTANYL CITRATE (PF) 100 MCG/2ML IJ SOLN
INTRAMUSCULAR | Status: DC | PRN
  Administered 2017-02-12: 25 ug via INTRAVENOUS

## 2017-02-12 MED ORDER — SODIUM CHLORIDE 0.9% FLUSH: 3.0000 mL | INTRAVENOUS | Status: DC | PRN

## 2017-02-12 MED ORDER — HEPARIN (PORCINE) IN NACL 2-0.9 UNIT/ML-% IJ SOLN
INTRAMUSCULAR | Status: AC | PRN
  Administered 2017-02-12: 1000 mL

## 2017-02-12 MED ORDER — HEPARIN (PORCINE) IN NACL 2-0.9 UNIT/ML-% IJ SOLN
INTRAMUSCULAR | Status: AC
  Filled 2017-02-12: qty 1000

## 2017-02-12 MED ORDER — LIDOCAINE HCL (PF) 1 % IJ SOLN
INTRAMUSCULAR | Status: DC | PRN
  Administered 2017-02-12: 5 mL

## 2017-02-12 MED ORDER — LIDOCAINE HCL (PF) 1 % IJ SOLN
INTRAMUSCULAR | Status: AC
  Filled 2017-02-12: qty 30

## 2017-02-12 MED ORDER — VERAPAMIL HCL 2.5 MG/ML IV SOLN
INTRAVENOUS | Status: DC | PRN
  Administered 2017-02-12: 10 mL via INTRA_ARTERIAL

## 2017-02-12 MED ORDER — ASPIRIN 81 MG PO CHEW: 81.0000 mg | CHEWABLE_TABLET | ORAL | Status: DC

## 2017-02-12 MED ORDER — SODIUM CHLORIDE 0.9 % IV SOLN: INTRAVENOUS | Status: AC

## 2017-02-12 MED ORDER — ONDANSETRON HCL 4 MG/2ML IJ SOLN: 4.0000 mg | Freq: Four times a day (QID) | INTRAMUSCULAR | Status: DC | PRN

## 2017-02-12 MED ORDER — HEPARIN SODIUM (PORCINE) 1000 UNIT/ML IJ SOLN
INTRAMUSCULAR | Status: AC
  Filled 2017-02-12: qty 1

## 2017-02-12 MED ORDER — IOPAMIDOL (ISOVUE-370) INJECTION 76%
INTRAVENOUS | Status: AC
  Filled 2017-02-12: qty 100

## 2017-02-12 MED ORDER — HEPARIN SODIUM (PORCINE) 1000 UNIT/ML IJ SOLN
INTRAMUSCULAR | Status: DC | PRN
  Administered 2017-02-12: 2500 [IU] via INTRAVENOUS

## 2017-02-12 MED ORDER — VERAPAMIL HCL 2.5 MG/ML IV SOLN
INTRAVENOUS | Status: AC
  Filled 2017-02-12: qty 2

## 2017-02-12 MED ORDER — FENTANYL CITRATE (PF) 100 MCG/2ML IJ SOLN
INTRAMUSCULAR | Status: AC
  Filled 2017-02-12: qty 2

## 2017-02-12 MED ORDER — MIDAZOLAM HCL 2 MG/2ML IJ SOLN
INTRAMUSCULAR | Status: DC | PRN
  Administered 2017-02-12: 1 mg via INTRAVENOUS

## 2017-02-12 MED ORDER — IOPAMIDOL (ISOVUE-370) INJECTION 76%
INTRAVENOUS | Status: DC | PRN
  Administered 2017-02-12: 30 mL via INTRA_ARTERIAL

## 2017-02-12 SURGICAL SUPPLY — 12 items
CATH BALLN WEDGE 5F 110CM (CATHETERS) ×2 IMPLANT
CATH INFINITI 5 FR JL3.5 (CATHETERS) ×2 IMPLANT
CATH INFINITI JR4 5F (CATHETERS) ×2 IMPLANT
DEVICE RAD COMP TR BAND LRG (VASCULAR PRODUCTS) ×2 IMPLANT
GLIDESHEATH SLEND SS 6F .021 (SHEATH) ×4 IMPLANT
GUIDEWIRE INQWIRE 1.5J.035X260 (WIRE) ×1 IMPLANT
INQWIRE 1.5J .035X260CM (WIRE) ×2
KIT HEART LEFT (KITS) ×2 IMPLANT
PACK CARDIAC CATHETERIZATION (CUSTOM PROCEDURE TRAY) ×2 IMPLANT
SHEATH GLIDE SLENDER 4/5FR (SHEATH) IMPLANT
TRANSDUCER W/STOPCOCK (MISCELLANEOUS) ×2 IMPLANT
TUBING CIL FLEX 10 FLL-RA (TUBING) ×2 IMPLANT

## 2017-02-12 NOTE — H&P (View-Only) (Signed)
Patient ID: [name], male   DOB: 1971/04/30, 45 y.o.   MRN: [id number]    Primary Cardiologist: Dr Domenic Polite HF MD: Dr Haroldine Laws [name]: Dr Mosetta Pigeon  HPI: [name] is 46 year old with history of NICM (diagnosed 10/2010 - norm cors 5427), chronic systolic heart failure EF 15% (08/2013), S/P ICD [employer], smoker, Wilms tumor s/p nephrectomy at age 68 had chemoradiation, scoliosis.    We saw him at the end of March 2015 and had large recurrent R pleural effusion. Referred to Dr. Prescott Gum who placed Pleurex catheter on 4/10, removed on 09/28/13.   CPX (5/15) with RER 1.06, VO2 max 18.5, VE/VCO2 slope 32.1.  Moderate to severe functional limitation, circulatory.   In 11/17 was admitted to [facility] with syncopal episode. ICD interrogation was negative.   Earlier this year we increased Entresto to 28/103.  Felt lightheaded and had stomach discomfort so he cut back.   Seen in ER in 5/18 with arm shoulder pain. Troponin ok. Improved with muscle relaxants  Today he returns for HF follow up. Since the last visit he was evaluated at [facility] by Dr Mosetta Pigeon for possible transplant. Recommendations for RHC. Denies SOB/PND/Orthopnea. Gets tired after walking grocery store. Appetite ok. Weight at home 130 pounds. Taking all medications. Says he is trying stop dipping tobacco.   ECHO 04/2014 EF 10% RV mildly dilated.   ECHO 2/18 EF 15% Mild Richard RV mild HK. Severe TR. Triv Richard . RV ok Echo 11/2016  EF 15% RV ok. Moderate to severe TR  CPX 09/17/16 Resting HR: 86 Peak HR: 150  (85% age predicted max HR) BP rest: 96/80 BP peak: 118/72 Peak VO2: 20.5 (53% predicted peak VO2) Ve/VCO2 slope: 35 OUES: 1.38 Peak RER: 1.04 Ventilatory Threshold: 15.7 (41% predicted or measured peak VO2) Peak RR 32 Peak Ventilation: 47.5 VE/MVV: 50% PETCO2 at peak: 30 O2pulse: 8  (62% predicted O2pulse)  CPX 5/15 Resting HR: 89 Peak HR: 138  (77% age predicted max HR) BP rest: 106/68 BP peak: 126/74  (IPE) Peak VO2: 18.5 (46.5% predicted peak VO2) VE/VCO2 slope: 32.1 OUES: 1.33 Peak RER: 1.06 Ventilatory Threshold: 13.5 (33.9% predicted peak VO2) Peak RR 31 Peak Ventilation: 37.1 VE/MVV: 62.7% PETCO2 at peak: 33 O2pulse: 7  (58% predicted O2pulse)  SH: Lives with his son 6 year old . Unemployed. Disability approved 3 years ago. Does not drink alcohol. Dips tobacco.   FH: Grandfather MI  ROS: All systems negative except as listed in HPI, PMH and Problem List.  Past Medical History:  Diagnosis Date  . Arthritis   . Cardiomyopathy, nonischemic (HCC)    takes Digoxin daily and Carvedilol daily  . CHF (congestive heart failure) (HCC)    takes Lasix daily as well Aldactone  . Chronic back pain   . Chronic systolic heart failure ([place])   . Depression    takes Prozac daily  . GERD (gastroesophageal reflux disease)    takes Omeprazole daily  . Hx of Alcohol consumption heavy    rare beer currently  . hx of Tobacco abuse    quit smoking 2014  . Hyperlipidemia    was on Simvastatin but has been off a year  . Insomnia    takes Ambien as needed(just got script yesterday)  . Orthostatic hypotension   . Scoliosis   . Shortness of breath    lying flat and with exertion  . 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 28 (XRT and chemo)  Current Outpatient Prescriptions  Medication Sig Dispense Refill  . aspirin 81 MG tablet Take 81 mg by mouth daily.      . carvedilol (COREG) 3.125 MG tablet Take 1 tablet (3.125 mg total) by mouth 2 (two) times daily with a meal. 180 tablet 2  . cyclobenzaprine (FLEXERIL) 10 MG tablet Take 1 tablet (10 mg total) by mouth 3 (three) times daily as needed for muscle spasms. 12 tablet 0  . Multiple Vitamin (MULTIVITAMIN WITH MINERALS) TABS tablet Take 1 tablet by mouth daily.    Marland Kitchen oxyCODONE (OXY IR/ROXICODONE) 5 MG immediate release tablet Take 1 tablet (5 mg total) by mouth 3 (three) times daily as needed for severe pain. 60 tablet 0  .  sacubitril-valsartan (ENTRESTO) 49-51 MG Take 1 tablet by mouth 2 (two) times daily. 60 tablet 6  . spironolactone (ALDACTONE) 25 MG tablet Take 0.5 tablets (12.5 mg total) by mouth daily. 45 tablet 3  . torsemide (DEMADEX) 20 MG tablet Take 20 mg by mouth daily.     No current facility-administered medications for this encounter.      PHYSICAL EXAM: Vitals:   02/06/17 1015  BP: 114/72  Pulse: 96  SpO2: 96%  Weight: 125 lb 2.4 oz (56.8 kg)   Filed Weights   02/06/17 1015  Weight: 125 lb 2.4 oz (56.8 kg)   General:  Well appearing. No resp difficulty HEENT: normal Neck: supple. JVP 8-9 prominent CV waves. Carotids 2+ bilat; no bruits. No lymphadenopathy or thryomegaly appreciated. Cor: PMI laterally displaced. Regular rate & rhythm. 2/6 TR no s3 Lungs: clear Abdomen: soft, nontender, nondistended. No hepatosplenomegaly. No bruits or masses. Good bowel sounds. Extremities: no cyanosis, clubbing, rash, edema Neuro: alert & orientedx3, cranial nerves grossly intact. moves all 4 extremities w/o difficulty. Affect pleasant  ASSESSMENT & PLAN: 1. Chronic Systolic HF:  Nonischemic cardiomyopathy, ECHO 03/2014 EF 10%. Echo 2/18 EF 15% RV mild HK  [employer] ICD.   --Echo 11/2016 LVEF 15%. RV mild HK moderate -severe TR -- CPX test from 4/18 reviewed again personally. Shows moderate functional impairment improved from 2015. --Overall doing well despite persistent severe LV dysfunction.  NYHA II. Volume status stable. Continue 20 mg torsemide.  Continue 12.5 mg spiro daily.  --Continue entresto to 49/51. Unable to tolerate 97/103 --Continue torsemide 20 daily. Can take extra as needed --Continue carvedilol 3.125 bid -- Unable to tolerate digoxin due to heartburn -- CPX reviewed with him again. pVO2 ~ 50% predicted.Improved from previous. We discussed the fact that he continues to do well despite his severe cardiomyopathy. Will continue current therapy for now but at some point will  likely need advanced therapies - ideally OHTx.Marland Kitchen -- Given small stature and blood type A-pos  - referred to Dr Mosetta Pigeon at [facility] and was evaluated 8/22 with recommendations for RHC reassess hemodynamics.   2. Severe TR -- Echo last visit moderate to severe TR but TV seems structurally intact. ? If related to ICD wire or just dilated annulus. Will consider TEE   Set up LHC/RHC next week.    Amy Clegg NP-C  02/06/2017  Patient seen and examined with Darrick Grinder, NP. We discussed all aspects of the encounter. I agree with the assessment and plan as stated above.   He is stable NYHA III. Continue current HF meds. Recently seen at [facility] for transplant evaluation. I spoke with Dr. Mosetta Pigeon today and will arrange L/R heart cath next week as part of transplant work-up.   Glori Bickers, MD  10:52 PM

## 2017-02-12 NOTE — Discharge Instructions (Signed)

## 2017-02-12 NOTE — Interval H&P Note (Signed)
History and Physical Interval Note:  02/12/2017 8:21 AM  [name]  has presented today for surgery, with the diagnosis of hf  The various methods of treatment have been discussed with the patient and family. After consideration of risks, benefits and other options for treatment, the patient has consented to  Procedure(s): RIGHT/LEFT HEART CATH AND CORONARY ANGIOGRAPHY (N/A) and possible angioplasty as a surgical intervention .  The patient's history has been reviewed, patient examined, no change in status, stable for surgery.  I have reviewed the patient's chart and labs.  Questions were answered to the patient's satisfaction.     [name], Quillian Quince

## 2017-02-12 NOTE — Telephone Encounter (Signed)
  Authorization Number:  [id number]  Auth Effective Date:  02/12/2017  Auth End Date:  05/13/2017

## 2017-02-13 ENCOUNTER — Encounter (HOSPITAL_COMMUNITY): Payer: Self-pay | Admitting: Internal Medicine

## 2017-02-13 DIAGNOSIS — I5022 Chronic systolic (congestive) heart failure: Secondary | ICD-10-CM | POA: Diagnosis not present

## 2017-02-13 DIAGNOSIS — Z7682 Awaiting organ transplant status: Secondary | ICD-10-CM | POA: Diagnosis not present

## 2017-02-13 DIAGNOSIS — R932 Abnormal findings on diagnostic imaging of liver and biliary tract: Secondary | ICD-10-CM | POA: Diagnosis not present

## 2017-02-13 DIAGNOSIS — I428 Other cardiomyopathies: Secondary | ICD-10-CM | POA: Diagnosis not present

## 2017-02-13 DIAGNOSIS — I071 Rheumatic tricuspid insufficiency: Secondary | ICD-10-CM | POA: Diagnosis not present

## 2017-02-22 ENCOUNTER — Ambulatory Visit: Payer: Medicare HMO | Admitting: Family Medicine

## 2017-02-25 ENCOUNTER — Ambulatory Visit (INDEPENDENT_AMBULATORY_CARE_PROVIDER_SITE_OTHER): Payer: Medicare HMO | Admitting: Family Medicine

## 2017-02-25 ENCOUNTER — Encounter: Payer: Self-pay | Admitting: Family Medicine

## 2017-02-25 VITALS — BP 118/78 | HR 82 | Temp 98.5°F | Resp 14 | Ht 69.0 in | Wt 134.0 lb

## 2017-02-25 DIAGNOSIS — E782 Mixed hyperlipidemia: Secondary | ICD-10-CM

## 2017-02-25 DIAGNOSIS — W57XXXD Bitten or stung by nonvenomous insect and other nonvenomous arthropods, subsequent encounter: Secondary | ICD-10-CM | POA: Diagnosis not present

## 2017-02-25 DIAGNOSIS — Z0000 Encounter for general adult medical examination without abnormal findings: Secondary | ICD-10-CM

## 2017-02-25 DIAGNOSIS — G894 Chronic pain syndrome: Secondary | ICD-10-CM | POA: Diagnosis not present

## 2017-02-25 DIAGNOSIS — R7302 Impaired glucose tolerance (oral): Secondary | ICD-10-CM | POA: Diagnosis not present

## 2017-02-25 DIAGNOSIS — Z23 Encounter for immunization: Secondary | ICD-10-CM | POA: Diagnosis not present

## 2017-02-25 MED ORDER — CYCLOBENZAPRINE HCL 10 MG PO TABS: 10.0000 mg | ORAL_TABLET | Freq: Three times a day (TID) | ORAL | 0 refills | Status: DC | PRN

## 2017-02-25 MED ORDER — OXYCODONE HCL 5 MG PO TABS: 5.0000 mg | ORAL_TABLET | Freq: Three times a day (TID) | ORAL | 0 refills | Status: DC | PRN

## 2017-02-25 MED ORDER — TETANUS-DIPHTH-ACELL PERTUSSIS 5-2-15.5 LF-MCG/0.5 IM SUSP: 0.5000 mL | Freq: Once | INTRAMUSCULAR | 0 refills | Status: AC

## 2017-02-25 NOTE — Addendum Note (Signed)
Addended by: Sheral Flow on: 02/25/2017 10:04 AM   Modules accepted: Orders

## 2017-02-25 NOTE — Progress Notes (Signed)
Subjective:   Patient presents for Medicare Annual/Subsequent preventive examination.    No concerns Medications reviewed  He has been referred to [facility], he is a heart transplant, he was also evaluated due to his chronic elevated LFT, he has had abnormal [place], but based on there evaluate no significant liver disease. He is still getting work up to clear his liver for the heart transplant team at [facility]   He is still followed in CHF clinic by Dr. Haroldine Laws, recently had cardiac catherization in preparation for heart transplant  Chronic pain- maintained on oxycodone, database shows last fill in May, but he has had 2 other scripts since then  Glucose intolerance, last A1c 6.6%  Nov 2017   Had multiple tick bites  On abdomen and around waist  , had rash few weeks ago, has felt fatigued since then, used cortisone, no spots on hands    Review Past Medical/Family/Social: per EMR    Risk Factors  Current exercise habits: walks Dietary issues discussed: Yes  Cardiac risk factors: CHF,   Depression Screen  (Note: if answer to either of the following is "Yes", a more complete depression screening is indicated)  Over the past two weeks, have you felt down, depressed or hopeless? No Over the past two weeks, have you felt little interest or pleasure in doing things? No Have you lost interest or pleasure in daily life? No Do you often feel hopeless? No Do you cry easily over simple problems? No   Activities of Daily Living  In your present state of health, do you have any difficulty performing the following activities?:  Driving? No  Managing money? No  Feeding yourself? No  Getting from bed to chair? No  Climbing a flight of stairs? No  Preparing food and eating?: No  Bathing or showering? No  Getting dressed: No  Getting to the toilet? No  Using the toilet:No  Moving around from place to place: No  In the past year have you fallen or had a near fall?Yes  Are you sexually  active? No  Do you have more than one partner? No   Hearing Difficulties: No  Do you often ask people to speak up or repeat themselves? No  Do you experience ringing or noises in your ears? No Do you have difficulty understanding soft or whispered voices? No  Do you feel that you have a problem with memory? No Do you often misplace items? No  Do you feel safe at home? Yes  Cognitive Testing  Alert? Yes Normal Appearance?Yes  Oriented to person? Yes Place? Yes  Time? Yes  Recall of three objects? Yes  Can perform simple calculations? Yes  Displays appropriate judgment?Yes  Can read the correct time from a watch face?Yes   List the Names of Other Physician/Practitioners you currently use:  Per above ,cardiology, [facility] transplant    Screening Tests / Date                   Influenza Vaccine Due Tetanus/tdap - Due   ROS: GEN- + fatigue, denies  fever, weight loss,weakness, recent illness HEENT- denies eye drainage, change in vision, nasal discharge, CVS- denies chest pain, palpitations RESP- denies SOB, cough, wheeze ABD- denies N/V, change in stools, abd pain GU- denies dysuria, hematuria, dribbling, incontinence MSK- denies joint pain, muscle aches, injury Neuro- denies headache, dizziness, syncope, seizure activity  PHYSICAL: GEN- NAD, alert and oriented x3 HEENT- PERRL, EOMI, non injected sclera, pink conjunctiva, MMM, oropharynx clear  Neck- Supple, no thryomegaly CVS- RRR, no murmur RESP-CTAB ABD-NABS,soft,NT,ND  Skin- healed scab at center of umbilicus, healed scabs on right hip EXT- No edema Pulses- Radial, DP- 2+    Assessment:    Annual wellness medicare exam   Plan:    During the course of the visit the patient was educated and counseled about appropriate screening and preventive services including:  Flu/TDAP vaccines, sent TDAP to pharmacy  Obtain fasting labs/ A1C   He is Preparing for the transplant. Blood pressure has been controlled he is not  smoking and no alcohol  HTN- blood pressure controlled Borderline DM- obtain A1C   Screen NeG  for depression.    Tick bites- fatigue after the bites may be multifactorial in the setting of his heart function. I will go ahead and do a titer before committing him to any treatment.  Diet review for nutrition referral? Yes ____ Not Indicated __x__  Patient Instructions (the written plan) was given to the patient.  Medicare Attestation  I have personally reviewed:  The patient's medical and social history  Their use of alcohol, tobacco or illicit drugs  Their current medications and supplements  The patient's functional ability including ADLs,fall risks, home safety risks, cognitive, and hearing and visual impairment  Diet and physical activities  Evidence for depression or mood disorders  The patient's weight, height, BMI, and visual acuity have been recorded in the chart. I have made referrals, counseling, and provided education to the patient based on review of the above and I have provided the patient with a written personalized care plan for preventive services.

## 2017-02-25 NOTE — Patient Instructions (Addendum)
F/U 6 months  Flu shot given TDAP sent to pharmacy We will call with lab results

## 2017-02-25 NOTE — Addendum Note (Signed)
Addended by: Sheral Flow on: 02/25/2017 10:05 AM   Modules accepted: Orders

## 2017-02-26 LAB — CBC WITH DIFFERENTIAL/PLATELET
BASOS PCT: 1 %
Basophils Absolute: 41 cells/uL (ref 0–200)
EOS PCT: 3.9 %
Eosinophils Absolute: 160 cells/uL (ref 15–500)
HCT: 46.6 % (ref 38.5–50.0)
Hemoglobin: 15.7 g/dL (ref 13.2–17.1)
Lymphs Abs: 857 cells/uL (ref 850–3900)
MCH: 30.9 pg (ref 27.0–33.0)
MCHC: 33.7 g/dL (ref 32.0–36.0)
MCV: 91.7 fL (ref 80.0–100.0)
MONOS PCT: 15.3 %
MPV: 11 fL (ref 7.5–12.5)
NEUTROS PCT: 58.9 %
Neutro Abs: 2415 cells/uL (ref 1500–7800)
PLATELETS: 239 10*3/uL (ref 140–400)
RBC: 5.08 10*6/uL (ref 4.20–5.80)
RDW: 13.2 % (ref 11.0–15.0)
TOTAL LYMPHOCYTE: 20.9 %
WBC mixed population: 627 cells/uL (ref 200–950)
WBC: 4.1 10*3/uL (ref 3.8–10.8)

## 2017-02-26 LAB — LIPID PANEL
CHOL/HDL RATIO: 3.7 (calc) (ref <5.0)
CHOLESTEROL: 228 mg/dL — AB (ref <200)
HDL: 61 mg/dL (ref >40)
LDL CHOLESTEROL (CALC): 149 mg/dL — AB
NON-HDL CHOLESTEROL (CALC): 167 mg/dL — AB (ref <130)
Triglycerides: 80 mg/dL (ref <150)

## 2017-02-26 LAB — HEMOGLOBIN A1C
Hgb A1c MFr Bld: 6.6 % of total Hgb — ABNORMAL HIGH (ref <5.7)
Mean Plasma Glucose: 143 (calc)
eAG (mmol/L): 7.9 (calc)

## 2017-02-26 LAB — B. BURGDORFI ANTIBODIES: B burgdorferi Ab IgG+IgM: <0.9 index

## 2017-02-26 LAB — BASIC METABOLIC PANEL
BUN: 15 mg/dL (ref 7–25)
CALCIUM: 9.7 mg/dL (ref 8.6–10.3)
CHLORIDE: 98 mmol/L (ref 98–110)
CO2: 32 mmol/L (ref 20–32)
CREATININE: 1.06 mg/dL (ref 0.60–1.35)
Glucose, Bld: 152 mg/dL — ABNORMAL HIGH (ref 65–99)
POTASSIUM: 4.4 mmol/L (ref 3.5–5.3)
Sodium: 139 mmol/L (ref 135–146)

## 2017-03-01 DIAGNOSIS — I071 Rheumatic tricuspid insufficiency: Secondary | ICD-10-CM | POA: Diagnosis not present

## 2017-03-01 DIAGNOSIS — I509 Heart failure, unspecified: Secondary | ICD-10-CM | POA: Diagnosis not present

## 2017-03-01 DIAGNOSIS — Z7682 Awaiting organ transplant status: Secondary | ICD-10-CM | POA: Diagnosis not present

## 2017-03-07 ENCOUNTER — Other Ambulatory Visit: Payer: Self-pay | Admitting: *Deleted

## 2017-03-07 DIAGNOSIS — R69 Illness, unspecified: Secondary | ICD-10-CM | POA: Diagnosis not present

## 2017-03-07 MED ORDER — BLOOD GLUCOSE SYSTEM PAK KIT: PACK | 1 refills | Status: DC

## 2017-03-07 MED ORDER — LANCET DEVICES MISC: 1 refills | Status: DC

## 2017-03-07 MED ORDER — BLOOD GLUCOSE TEST VI STRP: ORAL_STRIP | 1 refills | Status: DC

## 2017-03-07 MED ORDER — LANCETS MISC: 1 refills | Status: DC

## 2017-03-09 ENCOUNTER — Other Ambulatory Visit (HOSPITAL_COMMUNITY): Payer: Self-pay | Admitting: Internal Medicine

## 2017-03-09 DIAGNOSIS — I5022 Chronic systolic (congestive) heart failure: Secondary | ICD-10-CM

## 2017-03-12 ENCOUNTER — Ambulatory Visit (HOSPITAL_COMMUNITY)
Admission: RE | Admit: 2017-03-12 | Discharge: 2017-03-12 | Disposition: A | Discharge status: Home / Self Care | Payer: Medicare HMO | Source: Ambulatory Visit | Attending: Family Medicine | Admitting: Family Medicine

## 2017-03-12 ENCOUNTER — Encounter: Payer: Self-pay | Admitting: Family Medicine

## 2017-03-12 ENCOUNTER — Other Ambulatory Visit: Payer: Self-pay | Admitting: Family Medicine

## 2017-03-12 ENCOUNTER — Ambulatory Visit (INDEPENDENT_AMBULATORY_CARE_PROVIDER_SITE_OTHER): Payer: Medicare HMO | Admitting: Family Medicine

## 2017-03-12 VITALS — BP 110/64 | HR 76 | Temp 98.4°F | Resp 14 | Ht 69.0 in | Wt 128.0 lb

## 2017-03-12 DIAGNOSIS — N433 Hydrocele, unspecified: Secondary | ICD-10-CM | POA: Diagnosis not present

## 2017-03-12 DIAGNOSIS — N5089 Other specified disorders of the male genital organs: Secondary | ICD-10-CM

## 2017-03-12 DIAGNOSIS — N50811 Right testicular pain: Secondary | ICD-10-CM

## 2017-03-12 NOTE — Progress Notes (Signed)
   Subjective:    Patient ID: [name], male    DOB: Oct 10, 1970, 46 y.o.   MRN: [id number]  Patient presents for Pain in right side with groin area pain and swelling  Right flank pain On and off associated with swelling in his scrotum   History of right hydrocele,had surgery for this. He has noticed swelling and pain in groin the past week or so. No blood in urine, no difficulty urinating, just pain and swelling No fever.  No change in bowels no lesions on the penis or drainage from the penis   Review Of Systems:  GEN- denies fatigue, fever, weight loss,weakness, recent illness HEENT- denies eye drainage, change in vision, nasal discharge, CVS- denies chest pain, palpitations RESP- denies SOB, cough, wheeze ABD- denies N/V, change in stools, abd pain GU- denies dysuria, hematuria, dribbling, incontinence MSK- denies joint pain,+ muscle aches, injury Neuro- denies headache, dizziness, syncope, seizure activity       Objective:    BP 110/64   Pulse 76   Temp 98.4 F (36.9 C) (Oral)   Resp 14   Ht 5\' 9"  (1.753 m)   Wt 128 lb (58.1 kg)   SpO2 98%   BMI 18.90 kg/m  GEN- NAD, alert and oriented x3 ABD-NABS,soft,NT,ND, No CVA tenderness  GU large swelling right scrotum unable to palpate testicle or epididymis, palpable left testicle, NT, no lesions on scrotum or penis, no LAD in groin          Assessment & Plan:      Problem List Items Addressed This Visit    None    Visit Diagnoses    Hydrocele, right    -  Primary   STAT ultrasound obtained shows very large recurrent Hydrocole, no abscess noted, blood flow in tact, send to urology for further evaluation, he has pain meds   Relevant Orders   US Scrotum (Completed)   [place] SCROTOM DOPPLER (Completed)   Ambulatory referral to Urology   Pain in right testicle       Relevant Orders   US Scrotum (Completed)   [place] SCROTOM DOPPLER (Completed)   Ambulatory referral to Urology      Note: This dictation was prepared  with Dragon dictation along with smaller phrase technology. Any transcriptional errors that result from this process are unintentional.

## 2017-03-12 NOTE — Patient Instructions (Addendum)
Get the ultrasound today F/u pending results

## 2017-03-13 ENCOUNTER — Encounter: Payer: Self-pay | Admitting: Family Medicine

## 2017-03-15 ENCOUNTER — Ambulatory Visit (INDEPENDENT_AMBULATORY_CARE_PROVIDER_SITE_OTHER): Payer: Medicare HMO | Admitting: Urology

## 2017-03-15 DIAGNOSIS — N430 Encysted hydrocele: Secondary | ICD-10-CM | POA: Diagnosis not present

## 2017-03-18 ENCOUNTER — Ambulatory Visit (HOSPITAL_COMMUNITY)
Admission: RE | Admit: 2017-03-18 | Discharge: 2017-03-18 | Disposition: A | Discharge status: Home / Self Care | Payer: Medicare HMO | Source: Ambulatory Visit | Attending: Internal Medicine | Admitting: Internal Medicine

## 2017-03-18 ENCOUNTER — Encounter (HOSPITAL_COMMUNITY): Payer: Self-pay | Admitting: Internal Medicine

## 2017-03-18 VITALS — BP 122/80 | HR 82 | Wt 130.4 lb

## 2017-03-18 DIAGNOSIS — M419 Scoliosis, unspecified: Secondary | ICD-10-CM | POA: Insufficient documentation

## 2017-03-18 DIAGNOSIS — N433 Hydrocele, unspecified: Secondary | ICD-10-CM | POA: Insufficient documentation

## 2017-03-18 DIAGNOSIS — R55 Syncope and collapse: Secondary | ICD-10-CM | POA: Diagnosis not present

## 2017-03-18 DIAGNOSIS — Z95810 Presence of automatic (implantable) cardiac defibrillator: Secondary | ICD-10-CM | POA: Diagnosis not present

## 2017-03-18 DIAGNOSIS — Z01810 Encounter for preprocedural cardiovascular examination: Secondary | ICD-10-CM

## 2017-03-18 DIAGNOSIS — I5022 Chronic systolic (congestive) heart failure: Secondary | ICD-10-CM | POA: Insufficient documentation

## 2017-03-18 DIAGNOSIS — Z8249 Family history of ischemic heart disease and other diseases of the circulatory system: Secondary | ICD-10-CM | POA: Insufficient documentation

## 2017-03-18 DIAGNOSIS — I429 Cardiomyopathy, unspecified: Secondary | ICD-10-CM | POA: Insufficient documentation

## 2017-03-18 DIAGNOSIS — Z85528 Personal history of other malignant neoplasm of kidney: Secondary | ICD-10-CM | POA: Insufficient documentation

## 2017-03-18 DIAGNOSIS — R69 Illness, unspecified: Secondary | ICD-10-CM | POA: Diagnosis not present

## 2017-03-18 DIAGNOSIS — E785 Hyperlipidemia, unspecified: Secondary | ICD-10-CM | POA: Insufficient documentation

## 2017-03-18 DIAGNOSIS — Z87891 Personal history of nicotine dependence: Secondary | ICD-10-CM | POA: Insufficient documentation

## 2017-03-18 DIAGNOSIS — K219 Gastro-esophageal reflux disease without esophagitis: Secondary | ICD-10-CM | POA: Diagnosis not present

## 2017-03-18 DIAGNOSIS — F329 Major depressive disorder, single episode, unspecified: Secondary | ICD-10-CM | POA: Diagnosis not present

## 2017-03-18 DIAGNOSIS — Z7982 Long term (current) use of aspirin: Secondary | ICD-10-CM | POA: Insufficient documentation

## 2017-03-18 DIAGNOSIS — G4700 Insomnia, unspecified: Secondary | ICD-10-CM | POA: Insufficient documentation

## 2017-03-18 DIAGNOSIS — Z905 Acquired absence of kidney: Secondary | ICD-10-CM | POA: Diagnosis not present

## 2017-03-18 LAB — CBC
HCT: 42.5 % (ref 39.0–52.0)
Hemoglobin: 14.4 g/dL (ref 13.0–17.0)
MCH: 31.2 pg (ref 26.0–34.0)
MCHC: 33.9 g/dL (ref 30.0–36.0)
MCV: 92.2 fL (ref 78.0–100.0)
PLATELETS: 227 10*3/uL (ref 150–400)
RBC: 4.61 MIL/uL (ref 4.22–5.81)
RDW: 14.1 % (ref 11.5–15.5)
WBC: 4.3 10*3/uL (ref 4.0–10.5)

## 2017-03-18 LAB — BASIC METABOLIC PANEL
Anion gap: 8 (ref 5–15)
BUN: 9 mg/dL (ref 6–20)
CHLORIDE: 99 mmol/L — AB (ref 101–111)
CO2: 32 mmol/L (ref 22–32)
Calcium: 9.3 mg/dL (ref 8.9–10.3)
Creatinine, Ser: 0.96 mg/dL (ref 0.61–1.24)
GFR calc Af Amer: >60 mL/min (ref >60)
GFR calc non Af Amer: >60 mL/min (ref >60)
GLUCOSE: 117 mg/dL — AB (ref 65–99)
POTASSIUM: 3.4 mmol/L — AB (ref 3.5–5.1)
Sodium: 139 mmol/L (ref 135–145)

## 2017-03-18 NOTE — Patient Instructions (Signed)
Labs today (will call for abnormal results, otherwise no news is good news)   Follow up in 3 Months 

## 2017-03-18 NOTE — Progress Notes (Signed)
Patient ID: [name], male   DOB: 03/10/1971, 46 y.o.   MRN: [id number]    Primary Cardiologist: Dr Domenic Polite HF MD: Dr Haroldine Laws [name]: Dr Mosetta Pigeon  HPI: Mr Starliper is 46 year old with history of NICM (diagnosed 10/2010 - norm cors 2012 and 3/82), chronic systolic heart failure EF 15% (08/2013), S/P ICD [employer], smoker, Wilms tumor s/p nephrectomy at age 62 had chemoradiation, scoliosis.    We saw him at the end of March 2015 and had large recurrent R pleural effusion. Referred to Dr. Prescott Gum who placed Pleurex catheter on 4/10, removed on 09/28/13.   CPX (5/15) with RER 1.06, VO2 max 18.5, VE/VCO2 slope 32.1.  Moderate to severe functional limitation, circulatory.   In 11/17 was admitted to [facility] with syncopal episode. ICD interrogation was negative.   Earlier this year we increased Entresto to 58/103.  Felt lightheaded and had stomach discomfort so he cut back.   Seen in ER in 5/18 with arm shoulder pain. Troponin ok. Improved with muscle relaxants  Today he returns for HF follow up. Has been evaluated at [facility] by Dr Mosetta Pigeon for possible transplant. Since we saw him last - he underwent L/RHC on 02/12/17. Had normal coronaries and relatively well-compensated hemodynamics. EF 20%. Overall doing ok. Functional sttus stable. 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  Struggling with sinus congestion. On Saturday was standing on front porch. Got acutely sweaty and lightheaded. Presyncopal. No frank syncope. No ICD shock. Denies orthostasis.    ECHO 04/2014 EF 10% RV mildly dilated.   ECHO 2/18 EF 15% Mild MR RV mild HK. Severe TR. Triv MR . RV ok Echo 11/2016  EF 15% RV ok. Moderate to severe TR  Cath 02/12/17:  Normal cors  Ao = 91/60 (74) LV = 102/18 RA =  2 RV = 48/5 PA = 48/21 (31) PCW = 20 Fick cardiac output/index = 4.0/2.3 PVR = 2.8 WU Ao sat = 95% PA sat = 68%, 70% RA/PCWP = 0.10 PaPI = 13.5  CPX 09/17/16 Resting HR: 86 Peak HR: 150  (85% age  predicted max HR) BP rest: 96/80 BP peak: 118/72 Peak VO2: 20.5 (53% predicted peak VO2) Ve/VCO2 slope: 35 OUES: 1.38 Peak RER: 1.04 Ventilatory Threshold: 15.7 (41% predicted or measured peak VO2) Peak RR 32 Peak Ventilation: 47.5 VE/MVV: 50% PETCO2 at peak: 30 O2pulse: 8  (62% predicted O2pulse)  CPX 5/15 Resting HR: 89 Peak HR: 138  (77% age predicted max HR) BP rest: 106/68 BP peak: 126/74 (IPE) Peak VO2: 18.5 (46.5% predicted peak VO2) VE/VCO2 slope: 32.1 OUES: 1.33 Peak RER: 1.06 Ventilatory Threshold: 13.5 (33.9% predicted peak VO2) Peak RR 31 Peak Ventilation: 37.1 VE/MVV: 62.7% PETCO2 at peak: 33 O2pulse: 7  (58% predicted O2pulse)  SH: Lives with his son 44 year old . Unemployed. Disability approved 3 years ago. Does not drink alcohol. Dips tobacco.   FH: Grandfather MI  ROS: All systems negative except as listed in HPI, PMH and Problem List.  Past Medical History:  Diagnosis Date  . Arthritis   . Cardiomyopathy, nonischemic (HCC)    takes Digoxin daily and Carvedilol daily  . CHF (congestive heart failure) (HCC)    takes Lasix daily as well Aldactone  . Chronic back pain   . Chronic systolic heart failure ([place])   . Depression    takes Prozac daily  . GERD (gastroesophageal reflux disease)    takes Omeprazole daily  . Hx of Alcohol consumption  heavy    rare beer currently  . hx of Tobacco abuse    quit smoking 2014  . Hyperlipidemia    was on Simvastatin but has been off a year  . Insomnia    takes Ambien as needed(just got script yesterday)  . Orthostatic hypotension   . Scoliosis   . Shortness of breath    lying flat and with exertion  . 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 46 (XRT and chemo)    Current Outpatient Prescriptions  Medication Sig Dispense Refill  . aspirin 81 MG tablet Take 81 mg by mouth daily.      . Blood Glucose Monitoring Suppl (BLOOD GLUCOSE SYSTEM PAK) KIT Please dispense based on patient and insurance  preference. Use to monitor FSBS 2-3x weekly. Dx: E11.9. 1 each 1  . carvedilol (COREG) 3.125 MG tablet Take 1 tablet (3.125 mg total) by mouth 2 (two) times daily with a meal. 180 tablet 2  . cyclobenzaprine (FLEXERIL) 10 MG tablet Take 1 tablet (10 mg total) by mouth 3 (three) times daily as needed for muscle spasms. 12 tablet 0  . Glucose Blood (BLOOD GLUCOSE TEST STRIPS) STRP Please dispense based on patient and insurance preference. Use to monitor FSBS 2-3x weekly. Dx: E11.9. 50 each 1  . Lancet Devices MISC Please dispense based on patient and insurance preference. Use to monitor FSBS 2-3x weekly. Dx: E11.9. 1 each 1  . Lancets MISC Please dispense based on patient and insurance preference. Use to monitor FSBS 2-3x weekly. Dx: E11.9. 50 each 1  . Multiple Vitamin (MULTIVITAMIN WITH MINERALS) TABS tablet Take 1 tablet by mouth daily.    Marland Kitchen oxyCODONE (OXY IR/ROXICODONE) 5 MG immediate release tablet Take 1 tablet (5 mg total) by mouth 3 (three) times daily as needed for severe pain. 60 tablet 0  . sacubitril-valsartan (ENTRESTO) 49-51 MG Take 1 tablet by mouth 2 (two) times daily. 60 tablet 6  . spironolactone (ALDACTONE) 25 MG tablet Take 0.5 tablets (12.5 mg total) by mouth daily. 45 tablet 3  . torsemide (DEMADEX) 20 MG tablet Take 1 tablet (20 mg total) by mouth daily. 30 tablet 3   No current facility-administered medications for this encounter.      PHYSICAL EXAM: Vitals:   03/18/17 1131  BP: 122/80  Pulse: 82  SpO2: 98%  Weight: 130 lb 6.4 oz (59.1 kg)   Filed Weights   03/18/17 1131  Weight: 130 lb 6.4 oz (59.1 kg)   General:  Well appearing. No resp difficulty HEENT: normal Neck: supple. no JVD. Carotids 2+ bilat; no bruits. No lymphadenopathy or thryomegaly appreciated. Cor: PMI laterally displaced. Regular rate & rhythm. 2/6 TR. No s3 Lungs: clear Abdomen: soft, nontender, nondistended. No hepatosplenomegaly. No bruits or masses. Good bowel sounds. Extremities: no  cyanosis, clubbing, rash, edema Neuro: alert & orientedx3, cranial nerves grossly intact. moves all 4 extremities w/o difficulty. Affect pleasant   ASSESSMENT & PLAN: 1. Chronic Systolic HF:  Nonischemic cardiomyopathy, ECHO 03/2014 EF 10%. Echo 2/18 EF 15% RV mild HK  [employer] ICD.   - Echo 11/2016 LVEF 15%. RV mild HK moderate -severe TR - CPX test from 4/18 moderate functional impairment improved from 2015. - Cath 9/18: normal cors. Relatively well-compensated hemodynmaics - Overall doing well despite persistent severe LV dysfunction.  - Stable NYHA II  - Volume status stable. Continue torsemide - Continue 12.5 mg spiro daily.  - Continue entresto to 49/51. Unable to tolerate 97/103 - Continue torsemide 20 daily.  Can take extra as needed - Continue carvedilol 3.125 bid - Unable to tolerate digoxin due to heartburn - CPX reviewed with him again. pVO2 ~ 50% predicted.Improved from previous. We discussed the fact that he continues to do well despite his severe cardiomyopathy. Will continue current therapy for now but at some point will likely need advanced therapies - ideally OHTx.. - Given small stature and blood type A-pos  - referred to Dr Mosetta Pigeon at [facility] and was evaluated 8/22 with recommendations for RHC reassess hemodynamics.   2. Severe TR -- Echo last visit moderate to severe TR but TV seems structurally intact. ? If related to ICD wire or just dilated annulus. Will consider TEE   3. Presyncope - Suspect due to vagal episode or volume depletion. ICD interrogated personally in clinic. No VT or AF.    4. Hydrocele - Needs to have repair. Would be at high risk for cardiac complications with GA but I think he can tolerate if we use spinal or other local block. I will d/w Dr. Jeffie Pollock.   Glori Bickers, MD  11:42 AM

## 2017-03-20 ENCOUNTER — Telehealth (HOSPITAL_COMMUNITY): Payer: Self-pay | Admitting: *Deleted

## 2017-03-20 DIAGNOSIS — I5022 Chronic systolic (congestive) heart failure: Secondary | ICD-10-CM

## 2017-03-20 NOTE — Telephone Encounter (Signed)
-----   Message from Jolaine Artist, MD sent at 03/18/2017  1:01 PM EDT ----- Please have him take kcl 40 x . Repeat 1 week.

## 2017-03-25 ENCOUNTER — Inpatient Hospital Stay (HOSPITAL_COMMUNITY): Admission: RE | Admit: 2017-03-25 | Payer: Medicare HMO | Source: Ambulatory Visit

## 2017-03-26 ENCOUNTER — Telehealth: Payer: Self-pay | Admitting: *Deleted

## 2017-03-26 ENCOUNTER — Telehealth (HOSPITAL_COMMUNITY): Payer: Self-pay | Admitting: *Deleted

## 2017-03-26 MED ORDER — OXYCODONE HCL 5 MG PO TABS: 5.0000 mg | ORAL_TABLET | Freq: Three times a day (TID) | ORAL | 0 refills | Status: DC | PRN

## 2017-03-26 NOTE — Telephone Encounter (Signed)
Received call from patient.   Requested refill on Oxycodone.   Ok to refill??  Last office visit 03/12/2017.  Last refill 02/25/2017.

## 2017-03-26 NOTE — Telephone Encounter (Signed)
Prescription printed and patient made aware to come to office to pick up after 4pm on 03/26/2017. 

## 2017-03-26 NOTE — Telephone Encounter (Signed)
Okay to refill? 

## 2017-03-26 NOTE — Telephone Encounter (Signed)
Received surgical clearance note from [facility], pt needs a right hydrocelectomy with Dr Jeffie Pollock and needs to hold ASA 5 days prior.    Per Dr Haroldine Laws "Ok to hold ASA, avoid general anesthesia if at all possible"  Note faxed back to them at [phone]

## 2017-03-27 ENCOUNTER — Inpatient Hospital Stay (HOSPITAL_COMMUNITY): Admission: RE | Admit: 2017-03-27 | Payer: Medicare HMO | Source: Ambulatory Visit

## 2017-03-28 ENCOUNTER — Other Ambulatory Visit: Payer: Self-pay | Admitting: Urology

## 2017-03-28 NOTE — Progress Notes (Signed)
Consulted Dr. Oren Bracket ,Anesthesia about patient's medical history, ECHO, Cath report from 02/2017 and per Dr. Ola Spurr, patient is OK for surgery. Anesthesia will assess patient again morning of surgery.

## 2017-04-04 NOTE — Patient Instructions (Addendum)
[name]  04/04/2017   Your procedure is scheduled on: 04/11/2017    Report to [facility] Main  Entrance Take [place]  elevators to 3rd floor to  [place] at      0600 AM.    Call this number if you have problems the morning of surgery [phone]    Remember: ONLY 1 PERSON MAY GO WITH YOU TO SHORT STAY TO GET  READY MORNING OF YOUR SURGERY.  Do not eat food or drink liquids :After Midnight.     Take these medicines the morning of surgery with A SIP OF WATER:  DO NOT TAKE ANY DIABETIC MEDICATIONS DAY OF YOUR SURGERY                               You may not have any metal on your body including hair pins and              piercings  Do not wear jewelry,  lotions, powders or perfumes, deodorant           .              Men may shave face and neck.   Do not bring valuables to the hospital. [place].  Contacts, dentures or bridgework may not be worn into surgery.      Patients discharged the day of surgery will not be allowed to drive home.  Name and phone number of your driver:               Please read over the following fact sheets you were given: _____________________________________________________________________             [facility] - Preparing for Surgery Before surgery, you can play an important role.  Because skin is not sterile, your skin needs to be as free of germs as possible.  You can reduce the number of germs on your skin by washing with CHG (chlorahexidine gluconate) soap before surgery.  CHG is an antiseptic cleaner which kills germs and bonds with the skin to continue killing germs even after washing. Please DO NOT use if you have an allergy to CHG or antibacterial soaps.  If your skin becomes reddened/irritated stop using the CHG and inform your nurse when you arrive at Short Stay. Do not shave (including legs and underarms) for at least 48 hours prior to the first CHG  shower.  You may shave your face/neck. Please follow these instructions carefully:  1.  Shower with CHG Soap the night before surgery and the  morning of Surgery.  2.  If you choose to wash your hair, wash your hair first as usual with your  normal  shampoo.  3.  After you shampoo, rinse your hair and body thoroughly to remove the  shampoo.                           4.  Use CHG as you would any other liquid soap.  You can apply chg directly  to the skin and wash                       Gently with a  scrungie or clean washcloth.  5.  Apply the CHG Soap to your body ONLY FROM THE NECK DOWN.   Do not use on face/ open                           Wound or open sores. Avoid contact with eyes, ears mouth and genitals (private parts).                       Wash face,  Genitals (private parts) with your normal soap.             6.  Wash thoroughly, paying special attention to the area where your surgery  will be performed.  7.  Thoroughly rinse your body with warm water from the neck down.  8.  DO NOT shower/wash with your normal soap after using and rinsing off  the CHG Soap.                9.  Pat yourself dry with a clean towel.            10.  Wear clean pajamas.            11.  Place clean sheets on your bed the night of your first shower and do not  sleep with pets. Day of Surgery : Do not apply any lotions/deodorants the morning of surgery.  Please wear clean clothes to the hospital/surgery center.  FAILURE TO FOLLOW THESE INSTRUCTIONS MAY RESULT IN THE CANCELLATION OF YOUR SURGERY PATIENT SIGNATURE_________________________________  NURSE SIGNATURE__________________________________  ________________________________________________________________________

## 2017-04-05 NOTE — Addendum Note (Signed)
Encounter addended by: Jolaine Artist, MD on: 04/05/2017 11:41 PM<BR>    Actions taken: LOS modified

## 2017-04-08 ENCOUNTER — Encounter (HOSPITAL_COMMUNITY)
Admission: RE | Admit: 2017-04-08 | Discharge: 2017-04-08 | Disposition: A | Discharge status: Home / Self Care | Payer: Medicare HMO | Source: Ambulatory Visit | Attending: Urology | Admitting: Urology

## 2017-04-08 ENCOUNTER — Encounter (HOSPITAL_COMMUNITY): Payer: Self-pay

## 2017-04-08 DIAGNOSIS — Z79891 Long term (current) use of opiate analgesic: Secondary | ICD-10-CM | POA: Diagnosis not present

## 2017-04-08 DIAGNOSIS — G894 Chronic pain syndrome: Secondary | ICD-10-CM | POA: Diagnosis not present

## 2017-04-08 DIAGNOSIS — M1990 Unspecified osteoarthritis, unspecified site: Secondary | ICD-10-CM | POA: Diagnosis not present

## 2017-04-08 DIAGNOSIS — N433 Hydrocele, unspecified: Secondary | ICD-10-CM | POA: Diagnosis not present

## 2017-04-08 DIAGNOSIS — Z87891 Personal history of nicotine dependence: Secondary | ICD-10-CM | POA: Diagnosis not present

## 2017-04-08 DIAGNOSIS — E7800 Pure hypercholesterolemia, unspecified: Secondary | ICD-10-CM | POA: Diagnosis not present

## 2017-04-08 DIAGNOSIS — Z905 Acquired absence of kidney: Secondary | ICD-10-CM | POA: Diagnosis not present

## 2017-04-08 DIAGNOSIS — Z79899 Other long term (current) drug therapy: Secondary | ICD-10-CM | POA: Diagnosis not present

## 2017-04-08 DIAGNOSIS — I509 Heart failure, unspecified: Secondary | ICD-10-CM | POA: Diagnosis not present

## 2017-04-08 DIAGNOSIS — Z7982 Long term (current) use of aspirin: Secondary | ICD-10-CM | POA: Diagnosis not present

## 2017-04-08 HISTORY — DX: Presence of automatic (implantable) cardiac defibrillator: Z95.810

## 2017-04-08 LAB — BASIC METABOLIC PANEL
ANION GAP: 9 (ref 5–15)
BUN: 15 mg/dL (ref 6–20)
CO2: 30 mmol/L (ref 22–32)
Calcium: 9.2 mg/dL (ref 8.9–10.3)
Chloride: 99 mmol/L — ABNORMAL LOW (ref 101–111)
Creatinine, Ser: 1 mg/dL (ref 0.61–1.24)
GFR calc non Af Amer: >60 mL/min (ref >60)
Glucose, Bld: 128 mg/dL — ABNORMAL HIGH (ref 65–99)
POTASSIUM: 4.3 mmol/L (ref 3.5–5.1)
Sodium: 138 mmol/L (ref 135–145)

## 2017-04-08 LAB — CBC
HEMATOCRIT: 40.5 % (ref 39.0–52.0)
HEMOGLOBIN: 13.6 g/dL (ref 13.0–17.0)
MCH: 31.4 pg (ref 26.0–34.0)
MCHC: 33.6 g/dL (ref 30.0–36.0)
MCV: 93.5 fL (ref 78.0–100.0)
Platelets: 251 10*3/uL (ref 150–400)
RBC: 4.33 MIL/uL (ref 4.22–5.81)
RDW: 14.7 % (ref 11.5–15.5)
WBC: 4.5 10*3/uL (ref 4.0–10.5)

## 2017-04-08 NOTE — Progress Notes (Addendum)
03/18/17-[facility]- epic  EKG-02/12/17-epic  ECHO-11/2016-epic  Cath- 02/12/17-epic  CXR-10/11/16-epic  Last device check- 07/12/16-epic  03/26/17- See Telphone note from Dr Haroldine Laws regarding clearance and Aspirin.

## 2017-04-08 NOTE — Progress Notes (Addendum)
Dr Suzette Battiest ( anesthesia ) - asked regarding whether to give or not given Entresto preop.  Dr Ola Spurr stated not to give Entresto am of surgery.  Dr R. Fitzgerald ( anesthesia) made aware of ef 20% but well compensated , clearance from Dr Haroldine Laws in epic and patient on transplant workup [facility].  No further orders given.

## 2017-04-10 NOTE — H&P (Signed)
CC: I have swelling in my scrotum.  HPI: [name] is a 46 year-old male patient who was referred by Dr. Modena Nunnery. [place], MD who is here for scrotal swelling.  He first noticed his hydrocele 2 weeks ago. His hydrocele is on the right side. He does have pain on the side of his hydrocele.   [name] is a 46 yo WM who is sent consultation for a recurrent right hydrocele. He had a right hydrocelectomy about 10 years ago in [place]. He had the onset 2 weeks ago of some increased right scrotal pain and heaviness. It is hanging down more and bothering him particularly at the end of the day. He is having no voiding complaints. He has no dysuria. He a left nephrectomy at age 69 for a Wilm's tumor. He has a history of CHF but is doing well and has no edema. He has an implanted defibrillator. He is being evaluated for a heart transplant with an EF of <30%.      ALLERGIES: Ibuprofen - Hives    MEDICATIONS: Aspirin  Carvedilol 3.125 mg tablet  Entresto  Oxycodone Hcl 5 mg tablet  Spironolactone 25 mg tablet  Torsemide     GU PSH: Hydrocele repair Simple Nephrectomy    NON-GU PSH: Anesth, Cardioverter/defib    GU PMH: None     PMH Notes: kidney cancer   NON-GU PMH: Arthritis Chronic pain syndrome Congestive heart failure Hypercholesterolemia    FAMILY HISTORY: None   SOCIAL HISTORY: Marital Status: Unknown Preferred Language: English; Ethnicity: Not Hispanic Or Latino; Race: White Current Smoking Status: Patient does not smoke anymore. Has not smoked since 03/11/2013. Smoked for 22 years.   Tobacco Use Assessment Completed: Used Tobacco in last 30 days? Has never drank.  Drinks 1 caffeinated drink per day.     Notes: 1 son   REVIEW OF SYSTEMS:    GU Review Male:   Patient denies frequent urination, hard to postpone urination, burning/ pain with urination, get up at night to urinate, leakage of urine, stream starts and stops, trouble starting your stream, have to strain to  urinate , erection problems, and penile pain.  Gastrointestinal (Upper):   Patient denies nausea, vomiting, and indigestion/ heartburn.  Gastrointestinal (Lower):   Patient denies diarrhea and constipation.  Constitutional:   Patient denies fever, night sweats, weight loss, and fatigue.  Skin:   Patient denies skin rash/ lesion and itching.  Eyes:   Patient denies blurred vision and double vision.  Ears/ Nose/ Throat:   Patient denies sore throat and sinus problems.  Hematologic/Lymphatic:   Patient reports swollen glands. Patient denies easy bruising.  Cardiovascular:   Patient denies leg swelling and chest pains.  Respiratory:   Patient denies cough and shortness of breath.  Endocrine:   Patient denies excessive thirst.  Musculoskeletal:   Patient reports back pain. Patient denies joint pain.  Neurological:   Patient denies headaches and dizziness.  Psychologic:   Patient denies depression and anxiety.   VITAL SIGNS:      03/15/2017 08:55 AM  Weight 130 lb / 58.97 kg  Height 69 in / 175.26 cm  BP 97/63 mmHg  Pulse 67 /min  Temperature 98.1 F / 36.7 C  BMI 19.2 kg/m   GU PHYSICAL EXAMINATION:    Scrotum: No lesions. No edema. No cysts. No warts.  Epididymides: Right: no spermatocele, no masses, no cysts, no tenderness, no induration, no enlargement. Left: no spermatocele, no masses, no cysts, no tenderness, no induration, no  enlargement.  Testes: 5+ cm hydrocele right testis. No tenderness, no swelling, no enlargement left testis. No tenderness, no swelling, no enlargement right testis. Normal location left testis. Normal location right testis. No mass, no cyst, no varicocele, no hydrocele left testis. No mass, no cyst, no varicocele right testis.   Urethral Meatus: Normal size. No lesion, no wart, no discharge, no polyp. Normal location.  Penis: Circumcised, no warts, no cracks. No dorsal Peyronie's plaques, no left corporal Peyronie's plaques, no right corporal Peyronie's plaques, no  scarring, no warts. No balanitis, no meatal stenosis.   MULTI-SYSTEM PHYSICAL EXAMINATION:    Constitutional: Well-nourished. No physical deformities. Normally developed. Good grooming.  Neck: Neck symmetrical, not swollen. Normal tracheal position.  Respiratory: No labored breathing, no use of accessory muscles. CTA  Cardiovascular: Normal temperature, RRR without murmur  Lymphatic: No enlargement of neck, axillae, groin.  Skin: No paleness, no jaundice, no cyanosis. No lesion, no ulcer, no rash.  Neurologic / Psychiatric: Oriented to time, oriented to place, oriented to person. No depression, no anxiety, no agitation.  Gastrointestinal: No hernia. No mass, no tenderness, no rigidity, non obese abdomen.   Musculoskeletal: Normal gait and station of head and neck.     PAST DATA REVIEWED:  Source Of History:  Patient  Records Review:   Previous Doctor Records  X-Ray Review: Scrotal Ultrasound: Reviewed Report. Discussed With Patient.     PROCEDURES:          Urinalysis - 81003 Dipstick Dipstick Cont'd  Specimen: Voided Bilirubin: Neg  Color: Yellow Ketones: Neg  Appearance: Clear Blood: Neg  Specific Gravity: <= 1.005 Protein: Neg  pH: 5.0 Urobilinogen: 0.2  Glucose: Neg Nitrites: Neg    Leukocyte Esterase: Neg    ASSESSMENT:      ICD-10 Details  1 GU:   Hydrocele - N43.0 He has a large symptomatic right hydrocele and needs hydrocelectomy. I have reviewed the risks of bleeding, infection, testicular injury or atrophy, recurrence, thrombotic events and anesthetic complications. I will have him cleared by Dr. Haroldine Laws because of his cardiac history.    PLAN:           Schedule Return Visit/Planned Activity: ASAP - Schedule Surgery

## 2017-04-11 ENCOUNTER — Ambulatory Visit (HOSPITAL_COMMUNITY): Payer: Medicare HMO | Admitting: Certified Registered"

## 2017-04-11 ENCOUNTER — Encounter (HOSPITAL_COMMUNITY): Payer: Self-pay | Admitting: Emergency Medicine

## 2017-04-11 ENCOUNTER — Ambulatory Visit (HOSPITAL_COMMUNITY)
Admission: RE | Admit: 2017-04-11 | Discharge: 2017-04-11 | Disposition: A | Discharge status: Home / Self Care | Payer: Medicare HMO | Source: Ambulatory Visit | Attending: Urology | Admitting: Urology

## 2017-04-11 ENCOUNTER — Encounter (HOSPITAL_COMMUNITY)
Admission: RE | Disposition: A | Discharge status: Home / Self Care | Payer: Self-pay | Source: Ambulatory Visit | Attending: Urology

## 2017-04-11 DIAGNOSIS — I509 Heart failure, unspecified: Secondary | ICD-10-CM | POA: Insufficient documentation

## 2017-04-11 DIAGNOSIS — K7460 Unspecified cirrhosis of liver: Secondary | ICD-10-CM | POA: Diagnosis not present

## 2017-04-11 DIAGNOSIS — M1990 Unspecified osteoarthritis, unspecified site: Secondary | ICD-10-CM | POA: Insufficient documentation

## 2017-04-11 DIAGNOSIS — Z79891 Long term (current) use of opiate analgesic: Secondary | ICD-10-CM | POA: Insufficient documentation

## 2017-04-11 DIAGNOSIS — G894 Chronic pain syndrome: Secondary | ICD-10-CM | POA: Insufficient documentation

## 2017-04-11 DIAGNOSIS — Z7982 Long term (current) use of aspirin: Secondary | ICD-10-CM | POA: Diagnosis not present

## 2017-04-11 DIAGNOSIS — E785 Hyperlipidemia, unspecified: Secondary | ICD-10-CM | POA: Diagnosis not present

## 2017-04-11 DIAGNOSIS — I5022 Chronic systolic (congestive) heart failure: Secondary | ICD-10-CM | POA: Diagnosis not present

## 2017-04-11 DIAGNOSIS — Z905 Acquired absence of kidney: Secondary | ICD-10-CM | POA: Diagnosis not present

## 2017-04-11 DIAGNOSIS — E7800 Pure hypercholesterolemia, unspecified: Secondary | ICD-10-CM | POA: Insufficient documentation

## 2017-04-11 DIAGNOSIS — Z79899 Other long term (current) drug therapy: Secondary | ICD-10-CM | POA: Insufficient documentation

## 2017-04-11 DIAGNOSIS — Z87891 Personal history of nicotine dependence: Secondary | ICD-10-CM | POA: Diagnosis not present

## 2017-04-11 DIAGNOSIS — N433 Hydrocele, unspecified: Secondary | ICD-10-CM | POA: Diagnosis not present

## 2017-04-11 HISTORY — PX: HYDROCELE EXCISION: SHX482

## 2017-04-11 SURGERY — HYDROCELECTOMY
Anesthesia: General | Laterality: Right

## 2017-04-11 MED ORDER — LIDOCAINE 2% (20 MG/ML) 5 ML SYRINGE
INTRAMUSCULAR | Status: AC
  Filled 2017-04-11: qty 5

## 2017-04-11 MED ORDER — OXYCODONE HCL 5 MG PO TABS: 5.0000 mg | ORAL_TABLET | Freq: Once | ORAL | Status: DC | PRN

## 2017-04-11 MED ORDER — LACTATED RINGERS IV SOLN
INTRAVENOUS | Status: DC
  Administered 2017-04-11: 07:00:00 via INTRAVENOUS

## 2017-04-11 MED ORDER — OXYCODONE HCL 5 MG PO TABS: 5.0000 mg | ORAL_TABLET | ORAL | Status: DC | PRN

## 2017-04-11 MED ORDER — MIDAZOLAM HCL 2 MG/2ML IJ SOLN
INTRAMUSCULAR | Status: AC
  Filled 2017-04-11: qty 2

## 2017-04-11 MED ORDER — BUPIVACAINE HCL 0.25 % IJ SOLN
INTRAMUSCULAR | Status: DC | PRN
  Administered 2017-04-11: 19 mL

## 2017-04-11 MED ORDER — PROPOFOL 10 MG/ML IV BOLUS
INTRAVENOUS | Status: DC | PRN
  Administered 2017-04-11: 100 mg via INTRAVENOUS

## 2017-04-11 MED ORDER — FENTANYL CITRATE (PF) 100 MCG/2ML IJ SOLN
INTRAMUSCULAR | Status: AC
  Filled 2017-04-11: qty 2

## 2017-04-11 MED ORDER — SODIUM CHLORIDE 0.9 % IV SOLN: 250.0000 mL | INTRAVENOUS | Status: DC | PRN

## 2017-04-11 MED ORDER — EPHEDRINE 5 MG/ML INJ
INTRAVENOUS | Status: AC
  Filled 2017-04-11: qty 10

## 2017-04-11 MED ORDER — PROPOFOL 10 MG/ML IV BOLUS
INTRAVENOUS | Status: AC
  Filled 2017-04-11: qty 20

## 2017-04-11 MED ORDER — ACETAMINOPHEN 650 MG RE SUPP
650.0000 mg | RECTAL | Status: DC | PRN
  Filled 2017-04-11: qty 1

## 2017-04-11 MED ORDER — LIDOCAINE 2% (20 MG/ML) 5 ML SYRINGE
INTRAMUSCULAR | Status: DC | PRN
  Administered 2017-04-11: 80 mg via INTRAVENOUS

## 2017-04-11 MED ORDER — DEXAMETHASONE SODIUM PHOSPHATE 10 MG/ML IJ SOLN
INTRAMUSCULAR | Status: DC | PRN
  Administered 2017-04-11: 10 mg via INTRAVENOUS

## 2017-04-11 MED ORDER — DEXAMETHASONE SODIUM PHOSPHATE 10 MG/ML IJ SOLN
INTRAMUSCULAR | Status: AC
  Filled 2017-04-11: qty 1

## 2017-04-11 MED ORDER — MIDAZOLAM HCL 5 MG/5ML IJ SOLN
INTRAMUSCULAR | Status: DC | PRN
  Administered 2017-04-11 (×2): 1 mg via INTRAVENOUS

## 2017-04-11 MED ORDER — FENTANYL CITRATE (PF) 100 MCG/2ML IJ SOLN
INTRAMUSCULAR | Status: DC | PRN
  Administered 2017-04-11 (×4): 25 ug via INTRAVENOUS

## 2017-04-11 MED ORDER — ONDANSETRON HCL 4 MG/2ML IJ SOLN
INTRAMUSCULAR | Status: AC
  Filled 2017-04-11: qty 2

## 2017-04-11 MED ORDER — PHENYLEPHRINE 40 MCG/ML (10ML) SYRINGE FOR IV PUSH (FOR BLOOD PRESSURE SUPPORT)
PREFILLED_SYRINGE | INTRAVENOUS | Status: DC | PRN
  Administered 2017-04-11: 120 ug via INTRAVENOUS
  Administered 2017-04-11 (×3): 80 ug via INTRAVENOUS

## 2017-04-11 MED ORDER — SODIUM CHLORIDE 0.9% FLUSH: 3.0000 mL | INTRAVENOUS | Status: DC | PRN

## 2017-04-11 MED ORDER — SODIUM CHLORIDE 0.9% FLUSH: 3.0000 mL | Freq: Two times a day (BID) | INTRAVENOUS | Status: DC

## 2017-04-11 MED ORDER — BUPIVACAINE HCL (PF) 0.25 % IJ SOLN
INTRAMUSCULAR | Status: AC
  Filled 2017-04-11: qty 30

## 2017-04-11 MED ORDER — EPHEDRINE SULFATE-NACL 50-0.9 MG/10ML-% IV SOSY
PREFILLED_SYRINGE | INTRAVENOUS | Status: DC | PRN
  Administered 2017-04-11 (×2): 10 mg via INTRAVENOUS

## 2017-04-11 MED ORDER — MORPHINE SULFATE (PF) 4 MG/ML IV SOLN: 2.0000 mg | INTRAVENOUS | Status: DC | PRN

## 2017-04-11 MED ORDER — ONDANSETRON HCL 4 MG/2ML IJ SOLN
INTRAMUSCULAR | Status: DC | PRN
  Administered 2017-04-11: 4 mg via INTRAVENOUS

## 2017-04-11 MED ORDER — OXYCODONE HCL 5 MG/5ML PO SOLN: 5.0000 mg | Freq: Once | ORAL | Status: DC | PRN

## 2017-04-11 MED ORDER — PROMETHAZINE HCL 25 MG/ML IJ SOLN: 6.2500 mg | INTRAMUSCULAR | Status: DC | PRN

## 2017-04-11 MED ORDER — ACETAMINOPHEN 325 MG PO TABS: 650.0000 mg | ORAL_TABLET | ORAL | Status: DC | PRN

## 2017-04-11 MED ORDER — CEFAZOLIN SODIUM-DEXTROSE 2-4 GM/100ML-% IV SOLN
2.0000 g | INTRAVENOUS | Status: AC
  Administered 2017-04-11: 2 g via INTRAVENOUS
  Filled 2017-04-11: qty 100

## 2017-04-11 MED ORDER — FENTANYL CITRATE (PF) 100 MCG/2ML IJ SOLN: 25.0000 ug | INTRAMUSCULAR | Status: DC | PRN

## 2017-04-11 MED ORDER — PHENYLEPHRINE 40 MCG/ML (10ML) SYRINGE FOR IV PUSH (FOR BLOOD PRESSURE SUPPORT)
PREFILLED_SYRINGE | INTRAVENOUS | Status: AC
  Filled 2017-04-11: qty 10

## 2017-04-11 SURGICAL SUPPLY — 22 items
BENZOIN TINCTURE PRP APPL 2/3 (GAUZE/BANDAGES/DRESSINGS) ×2 IMPLANT
BLADE HEX COATED 2.75 (ELECTRODE) ×2 IMPLANT
BNDG GAUZE ELAST 4 BULKY (GAUZE/BANDAGES/DRESSINGS) ×2 IMPLANT
CATH URET 5FR 28IN OPEN ENDED (CATHETERS) IMPLANT
DECANTER SPIKE VIAL GLASS SM (MISCELLANEOUS) IMPLANT
DRAIN PENROSE 18X1/2 LTX STRL (DRAIN) IMPLANT
DRAIN PENROSE 18X1/4 LTX STRL (WOUND CARE) ×2 IMPLANT
ELECT REM PT RETURN 15FT ADLT (MISCELLANEOUS) ×2 IMPLANT
GAUZE SPONGE 4X4 12PLY STRL (GAUZE/BANDAGES/DRESSINGS) ×2 IMPLANT
GLOVE SURG SS PI 8.0 STRL IVOR (GLOVE) IMPLANT
GOWN STRL REUS W/TWL XL LVL3 (GOWN DISPOSABLE) ×2 IMPLANT
KIT BASIN OR (CUSTOM PROCEDURE TRAY) ×2 IMPLANT
NS IRRIG 1000ML POUR BTL (IV SOLUTION) IMPLANT
PACK GENERAL/GYN (CUSTOM PROCEDURE TRAY) ×2 IMPLANT
SPONGE LAP 4X18 X RAY DECT (DISPOSABLE) ×4 IMPLANT
SUPPORT SCROTAL LG STRP (MISCELLANEOUS) ×2 IMPLANT
SUT CHROMIC 3 0 SH 27 (SUTURE) ×4 IMPLANT
SUT CHROMIC 4 0 SH 27 (SUTURE) IMPLANT
SUT VIC AB 3-0 SH 27 (SUTURE) ×1
SUT VIC AB 3-0 SH 27XBRD (SUTURE) ×1 IMPLANT
SUT VICRYL 0 TIES 12 18 (SUTURE) ×2 IMPLANT
WATER STERILE IRR 1500ML POUR (IV SOLUTION) IMPLANT

## 2017-04-11 NOTE — Transfer of Care (Signed)
Immediate Anesthesia Transfer of Care Note  Patient: [name]  Procedure(s) Performed: RIGHT HYDROCELECTOMY ADULT (Right )  Patient Location: PACU  Anesthesia Type:General  Level of Consciousness: awake, alert  and oriented  Airway & Oxygen Therapy: Patient Spontanous Breathing and Patient connected to face mask oxygen  Post-op Assessment: Report given to RN and Post -op Vital signs reviewed and stable  Post vital signs: Reviewed and stable  Last Vitals:  Vitals:   04/11/17 0602  BP: 110/71  Pulse: 76  Resp: 16  Temp: 36.8 C  SpO2: 100%    Last Pain:  Vitals:   04/11/17 0602  TempSrc: Oral      Patients Stated Pain Goal: 3 (19/14/78 2956)  Complications: No apparent anesthesia complications

## 2017-04-11 NOTE — Interval H&P Note (Signed)
History and Physical Interval Note:  04/11/2017 7:57 AM  [name]  has presented today for surgery, with the diagnosis of RIGHT HYDROCELE  The various methods of treatment have been discussed with the patient and family. After consideration of risks, benefits and other options for treatment, the patient has consented to  Procedure(s): RIGHT HYDROCELECTOMY ADULT (Right) as a surgical intervention .  The patient's history has been reviewed, patient examined, no change in status, stable for surgery.  I have reviewed the patient's chart and labs.  Questions were answered to the patient's satisfaction.     [name]

## 2017-04-11 NOTE — Discharge Instructions (Addendum)
Hydrocelectomy, Adult, Care After This sheet gives you information about how to care for yourself after your procedure. Your health care provider may also give you more specific instructions. If you have problems or questions, contact your health care provider. What can I expect after the procedure? After your procedure, it is common to have mild discomfort, swelling, and bruising in the pouch that holds your testicles (scrotum). Follow these instructions at home: Bathing  Ask your health care provider when you can shower, take baths, or go swimming.  If you were told to wear an athletic support strap, take it off when you shower or take a bath. Incision care  Follow instructions from your health care provider about how to take care of your incision. Make sure you: ? Wash your hands with soap and water before you change your bandage (dressing). If soap and water are not available, use hand sanitizer. ? Change your dressing as told by your health care provider. ? Leave stitches (sutures) in place.  Check your incision and scrotum every day for signs of infection. Check for: ? More redness, swelling, or pain. ? Blood or fluid. ? Warmth. ? Pus or a bad smell. Managing pain, stiffness, and swelling  If directed, apply ice to the injured area: ? Put ice in a plastic bag. ? Place a towel between your skin and the bag. ? Leave the ice on for 20 minutes, 2-3 times per day. Driving  Do not drive for 24 hours if you were given a sedative.  Do not drive or use heavy machinery while taking prescription pain medicine.  Ask your health care provider when it is safe to drive. Activity  Do not do any activities that require great strength and energy (are vigorous) for as long as told by your health care provider.  Return to your normal activities as told by your health care provider. Ask your health care provider what activities are safe for you.  Do not lift anything that is heavier than 10  lb (4.5 kg) until your health care provider says that it is safe. General instructions  Take over-the-counter and prescription medicines only as told by your health care provider.  Keep all follow-up visits as told by your health care provider. This is important.  If you were given an athletic support strap, wear it as told by your health care provider.  If you had a drain put in during the procedure, you will need to return for a follow-up visit to have it removed. Contact a health care provider if:  Your pain is not controlled with medicine.  You have more redness or swelling around your scrotum.  You have blood or fluid coming from your scrotum.  Your incision feels warm to the touch.  You have pus or a bad smell coming from your scrotum.  You have a fever.  You may remove the drain in the morning with a dressing change.  The drain should slide out easily and if it doesn't, please call the office to come in to be seen.    This information is not intended to replace advice given to you by your health care provider. Make sure you discuss any questions you have with your health care provider. Document Released: 02/16/2015 Document Revised: 02/25/2016 Document Reviewed: 02/25/2016 Elsevier Interactive Patient Education  2018 [place] Anesthesia, Adult, Care After These instructions provide you with information about caring for yourself after your procedure. Your health care provider may also give  you more specific instructions. Your treatment has been planned according to current medical practices, but problems sometimes occur. Call your health care provider if you have any problems or questions after your procedure. What can I expect after the procedure? After the procedure, it is common to have:  Vomiting.  A sore throat.  Mental slowness.  It is common to feel:  Nauseous.  Cold or shivery.  Sleepy.  Tired.  Sore or achy, even in parts of your body  where you did not have surgery.  Follow these instructions at home: For at least 24 hours after the procedure:  Do not: ? Participate in activities where you could fall or become injured. ? Drive. ? Use heavy machinery. ? Drink alcohol. ? Take sleeping pills or medicines that cause drowsiness. ? Make important decisions or sign legal documents. ? Take care of children on your own.  Rest. Eating and drinking  If you vomit, drink water, juice, or soup when you can drink without vomiting.  Drink enough fluid to keep your urine clear or pale yellow.  Make sure you have little or no nausea before eating solid foods.  Follow the diet recommended by your health care provider. General instructions  Have a responsible adult stay with you until you are awake and alert.  Return to your normal activities as told by your health care provider. Ask your health care provider what activities are safe for you.  Take over-the-counter and prescription medicines only as told by your health care provider.  If you smoke, do not smoke without supervision.  Keep all follow-up visits as told by your health care provider. This is important. Contact a health care provider if:  You continue to have nausea or vomiting at home, and medicines are not helpful.  You cannot drink fluids or start eating again.  You cannot urinate after 8-12 hours.  You develop a skin rash.  You have fever.  You have increasing redness at the site of your procedure. Get help right away if:  You have difficulty breathing.  You have chest pain.  You have unexpected bleeding.  You feel that you are having a life-threatening or urgent problem. This information is not intended to replace advice given to you by your health care provider. Make sure you discuss any questions you have with your health care provider. Document Released: 09/03/2000 Document Revised: 10/31/2015 Document Reviewed: 05/12/2015 Elsevier  Interactive Patient Education  [employer].

## 2017-04-11 NOTE — Anesthesia Preprocedure Evaluation (Signed)
Anesthesia Evaluation  Patient identified by MRN, date of birth, ID band Patient awake    Reviewed: Allergy & Precautions, NPO status , Patient's Chart, lab work & pertinent test results  Airway Mallampati: II  TM Distance: >3 FB Neck ROM: Full    Dental  (+) Upper Dentures, Lower Dentures   Pulmonary neg pulmonary ROS, former smoker,    Pulmonary exam normal breath sounds clear to auscultation       Cardiovascular +CHF  Normal cardiovascular exam+ Cardiac Defibrillator  Rhythm:Regular Rate:Normal  EF 20%   Neuro/Psych negative neurological ROS  negative psych ROS   GI/Hepatic negative GI ROS, Neg liver ROS,   Endo/Other  negative endocrine ROS  Renal/GU negative Renal ROS  negative genitourinary   Musculoskeletal negative musculoskeletal ROS (+)   Abdominal   Peds negative pediatric ROS (+)  Hematology negative hematology ROS (+)   Anesthesia Other Findings   Reproductive/Obstetrics negative OB ROS                             Anesthesia Physical Anesthesia Plan  ASA: IV  Anesthesia Plan: General   Post-op Pain Management:    Induction: Intravenous  PONV Risk Score and Plan: 2 and Ondansetron and Dexamethasone  Airway Management Planned: LMA  Additional Equipment:   Intra-op Plan:   Post-operative Plan: Extubation in OR  Informed Consent: I have reviewed the patients History and Physical, chart, labs and discussed the procedure including the risks, benefits and alternatives for the proposed anesthesia with the patient or authorized representative who has indicated his/her understanding and acceptance.   Dental advisory given  Plan Discussed with: CRNA and Surgeon  Anesthesia Plan Comments:         Anesthesia Quick Evaluation

## 2017-04-11 NOTE — Progress Notes (Signed)
Discharge instructions discussed with pt, his mother sue, and his son.  Pt told to keep support on with gauze until drainage subsides.  Pt had minimal bright red drainage from pinrose drain at discharge after standing up and going to the bathroom. Pt told to change guaze out often and to check frequently for signs and symptoms of infection. Pt told to call office and/or visit ER for any emergent reason such as increased drainage from drain, fever/chills, n/v, cp, sob, dizziness.  Pt and family verbalized understanding of d/c instructions. Pt given ice pack and extra guaze supplies. Alert and oriented at discharge.

## 2017-04-11 NOTE — Anesthesia Postprocedure Evaluation (Signed)
Anesthesia Post Note  Patient: [name]  Procedure(s) Performed: RIGHT HYDROCELECTOMY ADULT (Right )     Patient location during evaluation: PACU Anesthesia Type: General Level of consciousness: awake and alert Pain management: pain level controlled Vital Signs Assessment: post-procedure vital signs reviewed and stable Respiratory status: spontaneous breathing, nonlabored ventilation, respiratory function stable and patient connected to nasal cannula oxygen Cardiovascular status: blood pressure returned to baseline and stable Postop Assessment: no apparent nausea or vomiting Anesthetic complications: no    Last Vitals:  Vitals:   04/11/17 0907 04/11/17 0915  BP: 108/76 107/74  Pulse:  90  Resp: 12 17  Temp: (!) 36.4 C   SpO2:  100%    Last Pain:  Vitals:   04/11/17 0602  TempSrc: Oral                 Irva Loser S

## 2017-04-11 NOTE — Anesthesia Procedure Notes (Signed)
Procedure Name: LMA Insertion Date/Time: 04/11/2017 8:12 AM Performed by: Noralyn Pick D Pre-anesthesia Checklist: Patient identified, Emergency Drugs available, Suction available and Patient being monitored Patient Re-evaluated:Patient Re-evaluated prior to induction Oxygen Delivery Method: Circle system utilized Preoxygenation: Pre-oxygenation with 100% oxygen Induction Type: IV induction Ventilation: Mask ventilation without difficulty LMA: LMA inserted LMA Size: 4.0 Tube type: Oral Number of attempts: 1 Placement Confirmation: ETT inserted through vocal cords under direct vision,  positive ETCO2 and breath sounds checked- equal and bilateral Tube secured with: Tape Dental Injury: Teeth and Oropharynx as per pre-operative assessment

## 2017-04-11 NOTE — Op Note (Signed)
04/11/2017  8:56 AM  PATIENT:  [name]  46 y.o. male  PRE-OPERATIVE DIAGNOSIS:  RIGHT HYDROCELE  POST-OPERATIVE DIAGNOSIS:  RIGHT HYDROCELE  PROCEDURE:  Procedure(s): RIGHT HYDROCELECTOMY ADULT (Right)  SURGEON:  Surgeon(s) and Role:    * Irine Seal, MD - Primary  PHYSICIAN ASSISTANT:   ASSISTANTS: none   ANESTHESIA:   local and general  EBL:  minimal   BLOOD ADMINISTERED:none  DRAINS: Penrose drain in the right scrotum    LOCAL MEDICATIONS USED:  MARCAINE    and Amount: 20 ml 0.25 %  SPECIMEN:  No Specimen  DISPOSITION OF SPECIMEN:  N/A  COUNTS:  YES  TOURNIQUET:  * No tourniquets in log *  INDICATION: RIGHT HYDROCELE.  PROCEDURE: [name] was taken to the operating room where he was given 2 g of Ancef.  A general anesthetic was induced.  He was placed in the supine position and his scrotum was clipped.  He was prepped with Betadine solution was draped in the usual sterile fashion.  A cord block was performed with 15 mL of quarter percent Marcaine and an additional 5 mL was instilled along the planned incision line.  Right anterior oblique scrotal incision was made along skin lines.  The dartos was opened with the Bovie.  The testicle within the hydrocele sac was delivered from the wound.  The hydrocele sac was opened and drained.  The redundant sac was excised.  The residual sac was imbricated behind the testicle in a water bottle fashion with a running locked 3-0 chromic suture.  Once hemostasis was achieved a 1/4 inch Penrose drain was placed through a separate stab wound in the inferior right scrotum.  The testicle was returned to the scrotum.  The dartos muscle was closed using a running 3-0 chromic suture.  The skin was closed with interrupted vertical mattress 3-0 chromic sutures.  A dressing of 4 x 4's, fluff Kerlix and a scrotal support was applied.  The anesthetic was reversed and he was moved to the recovery room in stable condition.  There were  no complications.  PLAN OF CARE: Discharge to home after PACU  PATIENT DISPOSITION:  PACU - hemodynamically stable.   Delay start of Pharmacological VTE agent (>24hrs) due to surgical blood loss or risk of bleeding: not applicable

## 2017-04-12 ENCOUNTER — Encounter (HOSPITAL_COMMUNITY): Payer: Self-pay | Admitting: Urology

## 2017-04-26 ENCOUNTER — Ambulatory Visit (INDEPENDENT_AMBULATORY_CARE_PROVIDER_SITE_OTHER): Payer: Medicare HMO | Admitting: Urology

## 2017-04-26 DIAGNOSIS — N430 Encysted hydrocele: Secondary | ICD-10-CM

## 2017-04-29 ENCOUNTER — Telehealth: Payer: Self-pay | Admitting: *Deleted

## 2017-04-29 MED ORDER — OXYCODONE HCL 5 MG PO TABS: 5.0000 mg | ORAL_TABLET | Freq: Three times a day (TID) | ORAL | 0 refills | Status: DC | PRN

## 2017-04-29 NOTE — Telephone Encounter (Signed)
Prescription printed and patient made aware to come to office to pick up on 04/29/2017 after 3pm per VM. 

## 2017-04-29 NOTE — Telephone Encounter (Signed)
Received call from patient.   Requested refill on Oxycodone.   Ok to refill??  Last office visit 03/12/2017.  Last refill 03/26/2017.

## 2017-04-29 NOTE — Telephone Encounter (Signed)
Okay to refill? 

## 2017-05-29 ENCOUNTER — Telehealth: Payer: Self-pay | Admitting: *Deleted

## 2017-05-29 NOTE — Telephone Encounter (Signed)
Okay to refill? 

## 2017-05-29 NOTE — Telephone Encounter (Signed)
Received call from patient.   Requested refill on Oxycodone.   Ok to refill??  Last office visit 03/12/2017.  Last refill 04/29/2017.

## 2017-05-30 MED ORDER — OXYCODONE HCL 5 MG PO TABS: 5.0000 mg | ORAL_TABLET | Freq: Three times a day (TID) | ORAL | 0 refills | Status: DC | PRN

## 2017-05-30 NOTE — Telephone Encounter (Signed)
Prescription printed and patient made aware to come to office to pick up after 2pm on 05/30/2018 per VM.

## 2017-06-02 DIAGNOSIS — R69 Illness, unspecified: Secondary | ICD-10-CM | POA: Diagnosis not present

## 2017-06-08 ENCOUNTER — Encounter (HOSPITAL_COMMUNITY): Payer: Self-pay | Admitting: Emergency Medicine

## 2017-06-08 ENCOUNTER — Other Ambulatory Visit: Payer: Self-pay

## 2017-06-08 ENCOUNTER — Emergency Department (HOSPITAL_COMMUNITY): Payer: Medicare HMO

## 2017-06-08 DIAGNOSIS — R0789 Other chest pain: Secondary | ICD-10-CM | POA: Diagnosis not present

## 2017-06-08 DIAGNOSIS — Z7982 Long term (current) use of aspirin: Secondary | ICD-10-CM | POA: Insufficient documentation

## 2017-06-08 DIAGNOSIS — Z95810 Presence of automatic (implantable) cardiac defibrillator: Secondary | ICD-10-CM | POA: Insufficient documentation

## 2017-06-08 DIAGNOSIS — F17220 Nicotine dependence, chewing tobacco, uncomplicated: Secondary | ICD-10-CM | POA: Insufficient documentation

## 2017-06-08 DIAGNOSIS — Z79899 Other long term (current) drug therapy: Secondary | ICD-10-CM | POA: Diagnosis not present

## 2017-06-08 DIAGNOSIS — Z8679 Personal history of other diseases of the circulatory system: Secondary | ICD-10-CM | POA: Insufficient documentation

## 2017-06-08 DIAGNOSIS — I5022 Chronic systolic (congestive) heart failure: Secondary | ICD-10-CM | POA: Insufficient documentation

## 2017-06-08 DIAGNOSIS — R079 Chest pain, unspecified: Secondary | ICD-10-CM | POA: Diagnosis not present

## 2017-06-08 DIAGNOSIS — R69 Illness, unspecified: Secondary | ICD-10-CM | POA: Diagnosis not present

## 2017-06-08 LAB — CBC
HCT: 41.9 % (ref 39.0–52.0)
Hemoglobin: 14.4 g/dL (ref 13.0–17.0)
MCH: 34 pg (ref 26.0–34.0)
MCHC: 34.4 g/dL (ref 30.0–36.0)
MCV: 99.1 fL (ref 78.0–100.0)
PLATELETS: 293 10*3/uL (ref 150–400)
RBC: 4.23 MIL/uL (ref 4.22–5.81)
RDW: 15.3 % (ref 11.5–15.5)
WBC: 5.9 10*3/uL (ref 4.0–10.5)

## 2017-06-08 LAB — BASIC METABOLIC PANEL
Anion gap: 9 (ref 5–15)
BUN: 17 mg/dL (ref 6–20)
CALCIUM: 9.5 mg/dL (ref 8.9–10.3)
CO2: 30 mmol/L (ref 22–32)
CREATININE: 1.39 mg/dL — AB (ref 0.61–1.24)
Chloride: 95 mmol/L — ABNORMAL LOW (ref 101–111)
GLUCOSE: 78 mg/dL (ref 65–99)
Potassium: 3.9 mmol/L (ref 3.5–5.1)
Sodium: 134 mmol/L — ABNORMAL LOW (ref 135–145)

## 2017-06-08 LAB — I-STAT TROPONIN, ED: TROPONIN I, POC: 0.03 ng/mL (ref 0.00–0.08)

## 2017-06-08 NOTE — ED Triage Notes (Signed)
Reports left sided chest pain for a couple of days.  Worse today.  Describes as a throbbing.  Hx of CHF.  Denies having any swelling or weight gain.

## 2017-06-08 NOTE — ED Provider Notes (Signed)
MSE was initiated and I personally evaluated the patient and placed orders (if any) at  8:32 PM on June 08, 2017.  The patient appears stable so that the remainder of the MSE may be completed by another provider.  Pt with CHF, here for chest pain x 2-3 days, worse today, radiates to left arm.  Initial EKG shows ? Mild elevation in I, aVL, repeat looks ok.   Malvin Johns, MD 06/08/17 2033

## 2017-06-09 ENCOUNTER — Emergency Department (HOSPITAL_COMMUNITY)
Admission: EM | Admit: 2017-06-09 | Discharge: 2017-06-09 | Disposition: A | Discharge status: Home / Self Care | Payer: Medicare HMO | Attending: Emergency Medicine | Admitting: Emergency Medicine

## 2017-06-09 DIAGNOSIS — Z95810 Presence of automatic (implantable) cardiac defibrillator: Secondary | ICD-10-CM | POA: Diagnosis not present

## 2017-06-09 DIAGNOSIS — Z7982 Long term (current) use of aspirin: Secondary | ICD-10-CM | POA: Diagnosis not present

## 2017-06-09 DIAGNOSIS — Z8679 Personal history of other diseases of the circulatory system: Secondary | ICD-10-CM

## 2017-06-09 DIAGNOSIS — R0789 Other chest pain: Secondary | ICD-10-CM | POA: Diagnosis not present

## 2017-06-09 DIAGNOSIS — I5022 Chronic systolic (congestive) heart failure: Secondary | ICD-10-CM | POA: Diagnosis not present

## 2017-06-09 DIAGNOSIS — R69 Illness, unspecified: Secondary | ICD-10-CM | POA: Diagnosis not present

## 2017-06-09 DIAGNOSIS — Z79899 Other long term (current) drug therapy: Secondary | ICD-10-CM | POA: Diagnosis not present

## 2017-06-09 LAB — I-STAT TROPONIN, ED: Troponin i, poc: 0.03 ng/mL (ref 0.00–0.08)

## 2017-06-09 MED ORDER — HYDROCODONE-ACETAMINOPHEN 5-325 MG PO TABS
2.0000 | ORAL_TABLET | Freq: Once | ORAL | Status: AC
  Administered 2017-06-09: 2 via ORAL
  Filled 2017-06-09: qty 2

## 2017-06-09 NOTE — ED Notes (Signed)
Pt understood dc material. NAD noted. 

## 2017-06-09 NOTE — ED Provider Notes (Signed)
[facility] EMERGENCY DEPARTMENT Provider Note   CSN: [id number] Arrival date & time: 06/08/17  1931     History   Chief Complaint Chief Complaint  Patient presents with  . Chest Pain    HPI [name] is a 46 y.o. male.  Patient is a 46 year old male with past medical history of congestive heart failure, cardiomyopathy with AICD.  He presents today for evaluation of pain in his left chest.  This started this morning when he woke from sleep.  He describes it as a sharp pain to the left lateral ribs and top of his anterior left chest.  There are no aggravating or alleviating factors.  This is not associated with any shortness of breath, nausea, diaphoresis, but he does describe radiation into his left arm.  He most recently underwent a heart cath in September 2018 revealing normal coronary arteries, but an EF of 20%.  He is followed by Dr. Jeffie Pollock.   The history is provided by the patient.  Chest Pain   This is a new problem. Episode onset: This morning. The problem occurs constantly. The problem has been gradually improving. The pain is moderate. The quality of the pain is described as sharp. The pain radiates to the left arm. Pertinent negatives include no diaphoresis, no nausea and no shortness of breath. He has tried nothing for the symptoms.    Past Medical History:  Diagnosis Date  . AICD (automatic cardioverter/defibrillator) present   . Arthritis   . Cardiomyopathy, nonischemic (HCC)    takes Digoxin daily and Carvedilol daily  . CHF (congestive heart failure) (HCC)    takes Lasix daily as well Aldactone  . Chronic back pain   . Chronic systolic heart failure ([place])   . Depression    takes Prozac daily  . GERD (gastroesophageal reflux disease)    takes Omeprazole daily  . Hx of Alcohol consumption heavy    rare beer currently  . hx of Tobacco abuse    quit smoking 2014  . Hyperlipidemia    was on Simvastatin but has been off a year  .  Insomnia    takes Ambien as needed(just got script yesterday)  . Orthostatic hypotension   . Scoliosis   . 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 30 (XRT and chemo)    Patient Active Problem List   Diagnosis Date Noted  . Tobacco abuse   . Alcohol abuse 10/19/2015  . Cirrhosis of liver ([place]) 09/21/2015  . Erectile dysfunction 03/18/2015  . Chronic pain syndrome 09/15/2014  . Insomnia 09/07/2013  . Chronic CHF (congestive heart failure) ([place]) 08/25/2013  . Paresthesia of hand 11/12/2012  . Hyperlipidemia 08/12/2012  . Glucose intolerance (impaired glucose tolerance) 06/29/2012  . Scoliosis 01/01/2012  . H/O compression fracture of spine 01/01/2012  . Back pain 10/22/2011  . Neck pain 10/22/2011  . 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/30/2011  . Chronic systolic congestive heart failure ([place]) 08/30/2011  . Depression 07/25/2011  . Laboratory test 06/20/2011  . Wilm's tumor   . Nonischemic dilated cardiomyopathy ([place]) 11/09/2010    Past Surgical History:  Procedure Laterality Date  . CARPAL TUNNEL RELEASE Right 01/09/2013   Procedure: CARPAL TUNNEL RELEASE;  Surgeon: Winfield Cunas, MD;  Location: [facility];  Service: Neurosurgery;  Laterality: Right;  RIGHT carpal tunnel release  . CARPAL TUNNEL RELEASE Left 01/30/2013   Procedure: LEFT CARPAL TUNNEL RELEASE;  Surgeon: Winfield Cunas, MD;  Location: [facility];  Service: Neurosurgery;  Laterality: Left;  LEFT Carpal Tunnel release  . CHEST TUBE INSERTION Right 09/09/2013   Procedure: INSERTION PLEURAL DRAINAGE CATHETER;  Surgeon: Ivin Poot, MD;  Location: [facility];  Service: Thoracic;  Laterality: Right;  . HYDROCELE EXCISION Right 04/11/2017   Procedure: RIGHT HYDROCELECTOMY ADULT;  Surgeon: Irine Seal, MD;  Location: [facility];  Service: Urology;  Laterality: Right;  . hydrocelectomy  2008  . ICD  05/25/2011   [employer] Endotak [employer] SG lead/[employer]nergen single chamber device  . IMPLANTABLE  CARDIOVERTER DEFIBRILLATOR IMPLANT N/A 05/25/2011   Procedure: IMPLANTABLE CARDIOVERTER DEFIBRILLATOR IMPLANT;  Surgeon: Evans Lance, MD;  Location: [facility] C[facility] LAB;  Service: Cardiovascular;  Laterality: N/A;  . NEPHRECTOMY  36 yrs ago   Wilms Tumor  . RIGHT/LEFT HEART CATH AND CORONARY ANGIOGRAPHY N/A 02/12/2017   Procedure: RIGHT/LEFT HEART CATH AND CORONARY ANGIOGRAPHY;  Surgeon: Jolaine Artist, MD;  Location: [facility] CV LAB;  Service: Cardiovascular;  Laterality: N/A;  . ULNAR NERVE TRANSPOSITION Right 01/09/2013   Procedure: ULNAR NERVE DECOMPRESSION/TRANSPOSITION;  Surgeon: Winfield Cunas, MD;  Location: [facility];  Service: Neurosurgery;  Laterality: Right;  RIGHT ulnar nerve decompression       Home Medications    Prior to Admission medications   Medication Sig Start Date End Date Taking? Authorizing Provider  aspirin 81 MG tablet Take 81 mg by mouth daily.      [provider]  Blood Glucose Monitoring Suppl (BLOOD GLUCOSE SYSTEM PAK) KIT Please dispense based on patient and insurance preference. Use to monitor FSBS 2-3x weekly. Dx: E11.9. 03/07/17   Alycia Rossetti, MD  carvedilol (COREG) 3.125 MG tablet Take 1 tablet (3.125 mg total) by mouth 2 (two) times daily with a meal. 07/09/16   [name], Shaune Pascal, MD  cyclobenzaprine (FLEXERIL) 10 MG tablet Take 1 tablet (10 mg total) by mouth 3 (three) times daily as needed for muscle spasms. 02/25/17   Alycia Rossetti, MD  Glucose Blood (BLOOD GLUCOSE TEST STRIPS) STRP Please dispense based on patient and insurance preference. Use to monitor FSBS 2-3x weekly. Dx: E11.9. 03/07/17   Alycia Rossetti, MD  Lancet Devices MISC Please dispense based on patient and insurance preference. Use to monitor FSBS 2-3x weekly. Dx: E11.9. 03/07/17   Alycia Rossetti, MD  Lancets MISC Please dispense based on patient and insurance preference. Use to monitor FSBS 2-3x weekly. Dx: E11.9. 03/07/17   Alycia Rossetti, MD  Multiple Vitamin  (MULTIVITAMIN WITH MINERALS) TABS tablet Take 1 tablet by mouth daily.    [provider]  oxyCODONE (OXY IR/ROXICODONE) 5 MG immediate release tablet Take 1 tablet (5 mg total) by mouth 3 (three) times daily as needed for severe pain. 05/30/17   Hamilton, Modena Nunnery, MD  sacubitril-valsartan (ENTRESTO) 49-51 MG Take 1 tablet by mouth 2 (two) times daily. 02/06/17   [name], Shaune Pascal, MD  sodium chloride (OCEAN) 0.65 % SOLN nasal spray Place 1 spray into both nostrils as needed for congestion.    [provider]  spironolactone (ALDACTONE) 25 MG tablet Take 0.5 tablets (12.5 mg total) by mouth daily. 12/05/16   [name], Shaune Pascal, MD  torsemide (DEMADEX) 20 MG tablet Take 1 tablet (20 mg total) by mouth daily. 03/11/17   [name], Shaune Pascal, MD    Family History Family History  Problem Relation Age of Onset  . Diabetes Mother     Social History Social History   Tobacco Use  . Smoking  status: Former Smoker    Packs/day: 0.25    Years: 20.00    Pack years: 5.00    Types: Cigarettes    Last attempt to quit: 09/07/2012    Years since quitting: 4.7  . Smokeless tobacco: Current User    Types: Snuff  Substance Use Topics  . Alcohol use: Yes    Alcohol/week: 0.0 oz    Comment: histoorically a heavy drinker ("beer only")  . Drug use: No     Allergies   Ibuprofen and Nsaids   Review of Systems Review of Systems  Constitutional: Negative for diaphoresis.  Respiratory: Negative for shortness of breath.   Cardiovascular: Positive for chest pain.  Gastrointestinal: Negative for nausea.  All other systems reviewed and are negative.    Physical Exam Updated Vital Signs BP 109/77   Pulse 79   Temp 97.9 F (36.6 C) (Oral)   Resp 17   Ht _0  (1.753 m)   Wt 59 kg (130 lb)   SpO2 98%   BMI 19.20 kg/m   Physical Exam  Constitutional: He is oriented to person, place, and time. He appears well-developed and well-nourished. No distress.  HENT:  Head:  Normocephalic and atraumatic.  Mouth/Throat: Oropharynx is clear and moist.  Neck: Normal range of motion. Neck supple.  Cardiovascular: Normal rate, regular rhythm and normal pulses. Exam reveals no friction rub.  No murmur heard. Pulmonary/Chest: Effort normal and breath sounds normal. No tachypnea. No respiratory distress. He has no wheezes. He has no rales.  Abdominal: Soft. Bowel sounds are normal. He exhibits no distension. There is no tenderness.  Musculoskeletal: Normal range of motion. He exhibits no edema.  Neurological: He is alert and oriented to person, place, and time. Coordination normal.  Skin: Skin is warm and dry. He is not diaphoretic.  Nursing note and vitals reviewed.    ED Treatments / Results  Labs (all labs ordered are listed, but only abnormal results are displayed) Labs Reviewed  BASIC METABOLIC PANEL - Abnormal; Notable for the following components:      Result Value   Sodium 134 (*)    Chloride 95 (*)    Creatinine, Ser 1.39 (*)    All other components within normal limits  CBC  I-STAT TROPONIN, ED  I-STAT TROPONIN, ED    EKG  EKG Interpretation  Date/Time:  Saturday June 08 2017 19:44:57 EST Ventricular Rate:  89 PR Interval:  130 QRS Duration: 124 QT Interval:  364 QTC Calculation: 442 R Axis:   -160 Text Interpretation:  Normal sinus rhythm Right superior axis deviation Cannot rule out Anterior infarct , age undetermined Abnormal ECG Confirmed by Veryl Speak [phone]) on 06/09/2017 1:16:00 AM       Radiology Dg Chest 2 View  Result Date: 06/08/2017 CLINICAL DATA:  Left-sided chest pain for 2 days EXAM: CHEST  2 VIEW COMPARISON:  10/11/2016 FINDINGS: Cardiac shadow is stable. Defibrillator is again seen and stable. Chronic blunting of the right costophrenic angle is noted. No focal infiltrate or sizable effusion is noted. Multiple compression deformities are noted at the thoracolumbar junction stable from the prior exam. No acute  abnormality noted. IMPRESSION: Chronic changes without acute abnormality. Electronically Signed   By: Inez Catalina M.D.   On: 06/08/2017 20:31    Procedures Procedures (including critical care time)  Medications Ordered in ED Medications  HYDROcodone-acetaminophen (NORCO/VICODIN) 5-325 MG per tablet 2 tablet (not administered)     Initial Impression / Assessment and Plan / ED Course  I have reviewed the triage vital signs and the nursing notes.  Pertinent labs & imaging results that were available during my care of the patient were reviewed by me and considered in my medical decision making (see chart for details).  Patient with cardiac history as described in the HPI presenting with complaints of left upper chest discomfort that is sharp in nature.  He has had negative troponin x2 this evening along with a heart cath showing normal coronaries in September of this year.  I highly doubt a cardiac etiology for his pain.  He does have a defibrillator in place and the pain he is experiencing has occurred intermittently since the device was placed.  It may well be related to the implantation of the device/musculoskeletal in nature.  Final Clinical Impressions(s) / ED Diagnoses   Final diagnoses:  None    ED Discharge Orders    None       Veryl Speak, MD 06/09/17 0202

## 2017-06-09 NOTE — Discharge Instructions (Signed)
Ibuprofen 600 mg every 6 hours as needed for pain.  Follow-up with your cardiologist this week if symptoms are not resolving, and return to the ER if you develop worsening pain, difficulty breathing, or other new and concerning symptoms.

## 2017-06-14 ENCOUNTER — Ambulatory Visit (HOSPITAL_COMMUNITY)
Admission: RE | Admit: 2017-06-14 | Discharge: 2017-06-14 | Disposition: A | Discharge status: Home / Self Care | Payer: Medicare HMO | Source: Ambulatory Visit | Attending: Internal Medicine | Admitting: Internal Medicine

## 2017-06-14 VITALS — BP 118/76 | HR 72 | Wt 138.8 lb

## 2017-06-14 DIAGNOSIS — F329 Major depressive disorder, single episode, unspecified: Secondary | ICD-10-CM | POA: Diagnosis not present

## 2017-06-14 DIAGNOSIS — Z905 Acquired absence of kidney: Secondary | ICD-10-CM | POA: Insufficient documentation

## 2017-06-14 DIAGNOSIS — M1990 Unspecified osteoarthritis, unspecified site: Secondary | ICD-10-CM | POA: Insufficient documentation

## 2017-06-14 DIAGNOSIS — G8929 Other chronic pain: Secondary | ICD-10-CM | POA: Diagnosis not present

## 2017-06-14 DIAGNOSIS — Z9221 Personal history of antineoplastic chemotherapy: Secondary | ICD-10-CM | POA: Insufficient documentation

## 2017-06-14 DIAGNOSIS — Z79899 Other long term (current) drug therapy: Secondary | ICD-10-CM | POA: Insufficient documentation

## 2017-06-14 DIAGNOSIS — Z923 Personal history of irradiation: Secondary | ICD-10-CM | POA: Insufficient documentation

## 2017-06-14 DIAGNOSIS — I071 Rheumatic tricuspid insufficiency: Secondary | ICD-10-CM | POA: Insufficient documentation

## 2017-06-14 DIAGNOSIS — Z8249 Family history of ischemic heart disease and other diseases of the circulatory system: Secondary | ICD-10-CM | POA: Diagnosis not present

## 2017-06-14 DIAGNOSIS — K219 Gastro-esophageal reflux disease without esophagitis: Secondary | ICD-10-CM | POA: Diagnosis not present

## 2017-06-14 DIAGNOSIS — M549 Dorsalgia, unspecified: Secondary | ICD-10-CM | POA: Insufficient documentation

## 2017-06-14 DIAGNOSIS — F17220 Nicotine dependence, chewing tobacco, uncomplicated: Secondary | ICD-10-CM | POA: Insufficient documentation

## 2017-06-14 DIAGNOSIS — Z95810 Presence of automatic (implantable) cardiac defibrillator: Secondary | ICD-10-CM | POA: Insufficient documentation

## 2017-06-14 DIAGNOSIS — Z7982 Long term (current) use of aspirin: Secondary | ICD-10-CM | POA: Insufficient documentation

## 2017-06-14 DIAGNOSIS — Z85528 Personal history of other malignant neoplasm of kidney: Secondary | ICD-10-CM | POA: Insufficient documentation

## 2017-06-14 DIAGNOSIS — G4700 Insomnia, unspecified: Secondary | ICD-10-CM | POA: Insufficient documentation

## 2017-06-14 DIAGNOSIS — I5022 Chronic systolic (congestive) heart failure: Secondary | ICD-10-CM | POA: Diagnosis not present

## 2017-06-14 DIAGNOSIS — I428 Other cardiomyopathies: Secondary | ICD-10-CM | POA: Insufficient documentation

## 2017-06-14 DIAGNOSIS — E785 Hyperlipidemia, unspecified: Secondary | ICD-10-CM | POA: Insufficient documentation

## 2017-06-14 DIAGNOSIS — R69 Illness, unspecified: Secondary | ICD-10-CM | POA: Diagnosis not present

## 2017-06-14 DIAGNOSIS — M419 Scoliosis, unspecified: Secondary | ICD-10-CM | POA: Diagnosis not present

## 2017-06-14 MED ORDER — SPIRONOLACTONE 25 MG PO TABS: 25.0000 mg | ORAL_TABLET | Freq: Every day | ORAL | 3 refills | Status: DC

## 2017-06-14 NOTE — Progress Notes (Signed)
Patient ID: [name], male   DOB: [birth year], 47 y.o.   MRN: [id number]    Primary Cardiologist: Dr Domenic Polite HF MD: Dr Haroldine Laws [name]: Dr Mosetta Pigeon  HPI: Mr Duchemin is 47 year old with history of NICM (diagnosed 10/2010 - norm cors 2012 and 2/51), chronic systolic heart failure EF 15% (08/2013), S/P ICD [employer], smoker, Wilms tumor s/p nephrectomy at age 86 had chemoradiation, scoliosis.    We saw him at the end of March 2015 and had large recurrent R pleural effusion. Referred to Dr. Prescott Gum who placed Pleurex catheter on 4/10, removed on 09/28/13.   CPX (5/15) with RER 1.06, VO2 max 18.5, VE/VCO2 slope 32.1.  Moderate to severe functional limitation, circulatory.   In 11/17 was admitted to [facility] with syncopal episode. ICD interrogation was negative.   Earlier this year we increased Entresto to 5/103.  Felt lightheaded and had stomach discomfort so he cut back.   Seen in ER in 5/18 with arm shoulder pain. Troponin ok. Improved with muscle relaxants  CPX 09/17/16 Peak VO2: 20.5 (53% predicted peak VO2) Slope 35  Has been evaluated at [facility] by Dr Mosetta Pigeon for possible transplant. Underwent L/RHC on 02/12/17. Had normal coronaries and relatively well-compensated hemodynamics. EF 20%.  Here for routine f/u. Says he feels great. Remains active with hunting. No CP or SOB. No edema, orthopnea or PND. Seen in ED last week for pain in his arm and shoulder. ECG and troponin normal. Now resolved. Scheduled to see Dr. Mosetta Pigeon next month.    ECHO 04/2014 EF 10% RV mildly dilated.   ECHO 2/18 EF 15% Mild MR RV mild HK. Severe TR. Triv MR . RV ok Echo 11/2016  EF 15% RV ok. Moderate to severe TR  Cath 02/12/17:  Normal cors  Ao = 91/60 (74) LV = 102/18 RA =  2 RV = 48/5 PA = 48/21 (31) PCW = 20 Fick cardiac output/index = 4.0/2.3 PVR = 2.8 WU Ao sat = 95% PA sat = 68%, 70% RA/PCWP = 0.10 PaPI = 13.5  CPX 09/17/16 Resting HR: 86 Peak HR: 150  (85% age predicted max  HR) BP rest: 96/80 BP peak: 118/72 Peak VO2: 20.5 (53% predicted peak VO2) Ve/VCO2 slope: 35 OUES: 1.38 Peak RER: 1.04 Ventilatory Threshold: 15.7 (41% predicted or measured peak VO2) Peak RR 32 Peak Ventilation: 47.5 VE/MVV: 50% PETCO2 at peak: 30 O2pulse: 8  (62% predicted O2pulse)  CPX 5/15 Resting HR: 89 Peak HR: 138  (77% age predicted max HR) BP rest: 106/68 BP peak: 126/74 (IPE) Peak VO2: 18.5 (46.5% predicted peak VO2) VE/VCO2 slope: 32.1 OUES: 1.33 Peak RER: 1.06 Ventilatory Threshold: 13.5 (33.9% predicted peak VO2) Peak RR 31 Peak Ventilation: 37.1 VE/MVV: 62.7% PETCO2 at peak: 33 O2pulse: 7  (58% predicted O2pulse)  SH: Lives with his son 83 year old . Unemployed. Disability approved 3 years ago. Does not drink alcohol. Dips tobacco.   FH: Grandfather MI  ROS: All systems negative except as listed in HPI, PMH and Problem List.  Past Medical History:  Diagnosis Date  . AICD (automatic cardioverter/defibrillator) present   . Arthritis   . Cardiomyopathy, nonischemic (HCC)    takes Digoxin daily and Carvedilol daily  . CHF (congestive heart failure) (HCC)    takes Lasix daily as well Aldactone  . Chronic back pain   . Chronic systolic heart failure ([place])   . Depression    takes Prozac daily  . GERD (gastroesophageal reflux disease)  takes Omeprazole daily  . Hx of Alcohol consumption heavy    rare beer currently  . hx of Tobacco abuse    quit smoking 2014  . Hyperlipidemia    was on Simvastatin but has been off a year  . Insomnia    takes Ambien as needed(just got script yesterday)  . Orthostatic hypotension   . Scoliosis   . 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 89 (XRT and chemo)    Current Outpatient Medications  Medication Sig Dispense Refill  . aspirin 81 MG tablet Take 81 mg by mouth daily.      . Blood Glucose Monitoring Suppl (BLOOD GLUCOSE SYSTEM PAK) KIT Please dispense based on patient and insurance preference. Use to monitor  FSBS 2-3x weekly. Dx: E11.9. 1 each 1  . carvedilol (COREG) 3.125 MG tablet Take 1 tablet (3.125 mg total) by mouth 2 (two) times daily with a meal. 180 tablet 2  . cyclobenzaprine (FLEXERIL) 10 MG tablet Take 1 tablet (10 mg total) by mouth 3 (three) times daily as needed for muscle spasms. 12 tablet 0  . Glucose Blood (BLOOD GLUCOSE TEST STRIPS) STRP Please dispense based on patient and insurance preference. Use to monitor FSBS 2-3x weekly. Dx: E11.9. 50 each 1  . Lancet Devices MISC Please dispense based on patient and insurance preference. Use to monitor FSBS 2-3x weekly. Dx: E11.9. 1 each 1  . Lancets MISC Please dispense based on patient and insurance preference. Use to monitor FSBS 2-3x weekly. Dx: E11.9. 50 each 1  . Multiple Vitamin (MULTIVITAMIN WITH MINERALS) TABS tablet Take 1 tablet by mouth daily.    Marland Kitchen oxyCODONE (OXY IR/ROXICODONE) 5 MG immediate release tablet Take 1 tablet (5 mg total) by mouth 3 (three) times daily as needed for severe pain. 60 tablet 0  . sacubitril-valsartan (ENTRESTO) 49-51 MG Take 1 tablet by mouth 2 (two) times daily. 60 tablet 6  . sodium chloride (OCEAN) 0.65 % SOLN nasal spray Place 1 spray into both nostrils as needed for congestion.    Marland Kitchen spironolactone (ALDACTONE) 25 MG tablet Take 0.5 tablets (12.5 mg total) by mouth daily. 45 tablet 3  . torsemide (DEMADEX) 20 MG tablet Take 1 tablet (20 mg total) by mouth daily. 30 tablet 3   No current facility-administered medications for this encounter.      PHYSICAL EXAM: Vitals:   06/14/17 1050  BP: 118/76  Pulse: 72  SpO2: 98%  Weight: 138 lb 12.8 oz (63 kg)   Filed Weights   06/14/17 1050  Weight: 138 lb 12.8 oz (63 kg)   General:  Well appearing. No resp difficulty HEENT: normal Neck: supple. no JVD. Carotids 2+ bilat; no bruits. No lymphadenopathy or thryomegaly appreciated. Cor: PMI laterally displaced. Regular rate & rhythm. No rubs, gallops or murmurs. Lungs: clear Abdomen: soft, nontender,  nondistended. No hepatosplenomegaly. No bruits or masses. Good bowel sounds. Extremities: no cyanosis, clubbing, rash, edema Neuro: alert & orientedx3, cranial nerves grossly intact. moves all 4 extremities w/o difficulty. Affect pleasant   ASSESSMENT & PLAN: 1. Chronic Systolic HF:  Nonischemic cardiomyopathy, ECHO 03/2014 EF 10%. Echo 2/18 EF 15% RV mild HK  [employer] ICD.   - Echo 11/2016 LVEF 15%. RV mild HK moderate -severe TR - CPX test from 4/18 moderate functional impairment improved from 2015. - Cath 9/18: normal cors. Relatively well-compensated hemodynmaics - Overall doing well despite persistent severe LV dysfunction.  - Stable NYHA II  - Volume status stable. Continue torsemide - Increase  spiro 25 mg daily )can cut back on torsemide as needed. BMET 1 week   - Continue entresto to 49/51. Unable to tolerate 97/103 - Continue torsemide 20 daily.  - Continue carvedilol 3.125 bid - Unable to tolerate digoxin due to heartburn - CPX reviewed with him again. pVO2 ~ 50% predicted.Improved from previous. We discussed the fact that he continues to do well despite his severe cardiomyopathy. Will continue current therapy for now but at some point will likely need advanced therapies - ideally OHTx.  - Given small stature and blood type A-pos  - Has f/u with Dr. [facility]etta [facility]on at [facility] in February. Will repeat CPX prior to visit   2. Severe TR -- Echo with moderate to severe TR but TV seems structurally intact. ? If related to ICD wire or just dilated annulus.   3. Presyncope - Resolved. ICD interrogated personally in clinic. No VT or AF.    4. Hydrocele - s/p repair with Dr. Jeffie Pollock.   Glori Bickers, MD  11:13 AM

## 2017-06-14 NOTE — Patient Instructions (Signed)
INCREASE Spironolactone to 25 mg (1 Tablet) Once daily  Labs in 2 weeks (bmet)  Cardiopulmonary test has been ordered for you, we will schedule at checkout.  Follow up in 3 months, we will call you to schedule appointment.

## 2017-06-24 ENCOUNTER — Other Ambulatory Visit: Payer: Self-pay | Admitting: Internal Medicine

## 2017-06-28 ENCOUNTER — Other Ambulatory Visit (HOSPITAL_COMMUNITY): Payer: Self-pay | Admitting: *Deleted

## 2017-06-28 ENCOUNTER — Other Ambulatory Visit: Payer: Self-pay | Admitting: *Deleted

## 2017-06-28 ENCOUNTER — Ambulatory Visit (HOSPITAL_COMMUNITY): Payer: Medicare HMO

## 2017-06-28 ENCOUNTER — Ambulatory Visit (HOSPITAL_COMMUNITY)
Admission: RE | Admit: 2017-06-28 | Discharge: 2017-06-28 | Disposition: A | Discharge status: Home / Self Care | Payer: Medicare HMO | Source: Ambulatory Visit | Attending: Internal Medicine | Admitting: Internal Medicine

## 2017-06-28 DIAGNOSIS — I5022 Chronic systolic (congestive) heart failure: Secondary | ICD-10-CM

## 2017-06-28 LAB — BASIC METABOLIC PANEL
ANION GAP: 10 (ref 5–15)
BUN: 13 mg/dL (ref 6–20)
CHLORIDE: 103 mmol/L (ref 101–111)
CO2: 27 mmol/L (ref 22–32)
Calcium: 9.3 mg/dL (ref 8.9–10.3)
Creatinine, Ser: 1.16 mg/dL (ref 0.61–1.24)
GFR calc non Af Amer: >60 mL/min (ref >60)
Glucose, Bld: 178 mg/dL — ABNORMAL HIGH (ref 65–99)
POTASSIUM: 3.2 mmol/L — AB (ref 3.5–5.1)
Sodium: 140 mmol/L (ref 135–145)

## 2017-06-28 MED ORDER — OXYCODONE HCL 5 MG PO TABS: 5.0000 mg | ORAL_TABLET | Freq: Three times a day (TID) | ORAL | 0 refills | Status: DC | PRN

## 2017-06-28 NOTE — Telephone Encounter (Signed)
Received call from patient.   Requested refill on Oxycodone.   Ok to refill??  Last office visit 03/12/2017.  Last refill 05/30/2017.

## 2017-07-04 ENCOUNTER — Other Ambulatory Visit (HOSPITAL_COMMUNITY): Payer: Self-pay | Admitting: Internal Medicine

## 2017-07-04 DIAGNOSIS — I5022 Chronic systolic (congestive) heart failure: Secondary | ICD-10-CM

## 2017-07-11 ENCOUNTER — Telehealth (HOSPITAL_COMMUNITY): Payer: Self-pay | Admitting: *Deleted

## 2017-07-11 DIAGNOSIS — E876 Hypokalemia: Secondary | ICD-10-CM

## 2017-07-11 NOTE — Telephone Encounter (Signed)
Notes recorded by Scarlette Calico, RN on 07/11/2017 at 3:17 PM EST Pt aware, he is unable to come this week, lab sch for 2/5 ------  Notes recorded by Scarlette Calico, RN on 07/10/2017 at 5:03 PM EST Left message to call back

## 2017-07-11 NOTE — Telephone Encounter (Signed)
-----   Message from Jolaine Artist, MD sent at 07/09/2017  3:50 PM EST ----- Can we repeat BMET this week?

## 2017-07-16 ENCOUNTER — Ambulatory Visit (HOSPITAL_COMMUNITY)
Admission: RE | Admit: 2017-07-16 | Discharge: 2017-07-16 | Disposition: A | Discharge status: Home / Self Care | Payer: Medicare HMO | Source: Ambulatory Visit | Attending: Cardiology | Admitting: Cardiology

## 2017-07-16 DIAGNOSIS — E876 Hypokalemia: Secondary | ICD-10-CM | POA: Diagnosis not present

## 2017-07-16 LAB — BASIC METABOLIC PANEL
ANION GAP: 11 (ref 5–15)
BUN: 10 mg/dL (ref 6–20)
CALCIUM: 9.2 mg/dL (ref 8.9–10.3)
CO2: 27 mmol/L (ref 22–32)
CREATININE: 0.94 mg/dL (ref 0.61–1.24)
Chloride: 96 mmol/L — ABNORMAL LOW (ref 101–111)
GFR calc Af Amer: >60 mL/min (ref >60)
GFR calc non Af Amer: >60 mL/min (ref >60)
GLUCOSE: 134 mg/dL — AB (ref 65–99)
Potassium: 4.4 mmol/L (ref 3.5–5.1)
Sodium: 134 mmol/L — ABNORMAL LOW (ref 135–145)

## 2017-08-01 ENCOUNTER — Other Ambulatory Visit: Payer: Self-pay | Admitting: *Deleted

## 2017-08-01 NOTE — Telephone Encounter (Signed)
Received call from patient.   Requested refill on Oxycodone.   Ok to refill??  Last office visit 03/12/2017.  Last refill 06/28/2017.

## 2017-08-02 MED ORDER — OXYCODONE HCL 5 MG PO TABS: 5.0000 mg | ORAL_TABLET | Freq: Three times a day (TID) | ORAL | 0 refills | Status: DC | PRN

## 2017-08-07 DIAGNOSIS — I428 Other cardiomyopathies: Secondary | ICD-10-CM | POA: Diagnosis not present

## 2017-08-07 DIAGNOSIS — I5022 Chronic systolic (congestive) heart failure: Secondary | ICD-10-CM | POA: Diagnosis not present

## 2017-08-26 ENCOUNTER — Ambulatory Visit (INDEPENDENT_AMBULATORY_CARE_PROVIDER_SITE_OTHER): Payer: Medicare HMO | Admitting: Family Medicine

## 2017-08-26 ENCOUNTER — Other Ambulatory Visit: Payer: Self-pay

## 2017-08-26 ENCOUNTER — Encounter: Payer: Self-pay | Admitting: Family Medicine

## 2017-08-26 VITALS — BP 112/68 | HR 90 | Temp 98.6°F | Resp 14 | Ht 69.0 in | Wt 146.0 lb

## 2017-08-26 DIAGNOSIS — E782 Mixed hyperlipidemia: Secondary | ICD-10-CM

## 2017-08-26 DIAGNOSIS — I420 Dilated cardiomyopathy: Secondary | ICD-10-CM | POA: Diagnosis not present

## 2017-08-26 DIAGNOSIS — G894 Chronic pain syndrome: Secondary | ICD-10-CM

## 2017-08-26 DIAGNOSIS — R7302 Impaired glucose tolerance (oral): Secondary | ICD-10-CM

## 2017-08-26 MED ORDER — OXYCODONE HCL 5 MG PO TABS: 5.0000 mg | ORAL_TABLET | Freq: Three times a day (TID) | ORAL | 0 refills | Status: DC | PRN

## 2017-08-26 NOTE — Assessment & Plan Note (Addendum)
Continue oxycodone, no acetaminophen secondary to his liver

## 2017-08-26 NOTE — Patient Instructions (Signed)
F/U 6 months for Physical  

## 2017-08-26 NOTE — Assessment & Plan Note (Signed)
Overall doing well.  Followed by cardiology he is taking his medications as prescribed no side effects

## 2017-08-26 NOTE — Progress Notes (Signed)
   Subjective:    Patient ID: [name], male    DOB: 01-20-1971, 47 y.o.   MRN: [id number]  Patient presents for Follow-up (is fasting)  Pt here to f/u chronic medical problems    HTN- taking BP meds, evavluated for heart transplant for his CHF/CARDIOMPOPATHY, seen by [facility], he is on [place] which has improved his function, he was able to walk on treadmill 17 minutes   He does take the toresmide/ aldactone/ coreg   Gained 10lbs, since he stopped dipping, he has been eating more    Borderline DM- last A1C 6.6%   Hyperlipidemia- due for recheck    Chronic pain syndrome, maintained on oxycodone , request refill as well as muscle relaxer    Review Of Systems:  GEN- denies fatigue, fever, weight loss,weakness, recent illness HEENT- denies eye drainage, change in vision, nasal discharge, CVS- denies chest pain, palpitations RESP- denies SOB, cough, wheeze ABD- denies N/V, change in stools, abd pain GU- denies dysuria, hematuria, dribbling, incontinence MSK- denies joint pain, muscle aches, injury Neuro- denies headache, dizziness, syncope, seizure activity       Objective:    BP 112/68   Pulse 90   Temp 98.6 F (37 C) (Oral)   Resp 14   Ht 5\' 9"  (1.753 m)   Wt 146 lb (66.2 kg)   SpO2 96%   BMI 21.56 kg/m  GEN- NAD, alert and oriented x3 HEENT- PERRL, EOMI, non injected sclera, pink conjunctiva, MMM, oropharynx clear Neck- Supple,  CVS- RRR, no murmur, defibrillator RESP-CTAB ABD-NABS,soft,NT,ND EXT- No edema Pulses- Radial, 2+        Assessment & Plan:      Problem List Items Addressed This Visit      Unprioritized   Nonischemic dilated cardiomyopathy (HCC) (Chronic)    Overall doing well.  Followed by cardiology he is taking his medications as prescribed no side effects      Relevant Orders   CBC with Differential/Platelet   Comprehensive metabolic panel   Hyperlipidemia    No currrent statin drug      Relevant Orders   CBC with  Differential/Platelet   Comprehensive metabolic panel   Lipid panel   Glucose intolerance (impaired glucose tolerance)    Recheck A1c if above 6.5% he is diabetic cutting out carbs watching the sugar and soda      Relevant Orders   Hemoglobin A1c   Chronic pain syndrome - Primary    Continue oxycodone, no acetaminophen secondary to his liver         Note: This dictation was prepared with Dragon dictation along with smaller phrase technology. Any transcriptional errors that result from this process are unintentional.

## 2017-08-26 NOTE — Assessment & Plan Note (Signed)
Recheck A1c if above 6.5% he is diabetic cutting out carbs watching the sugar and soda

## 2017-08-26 NOTE — Assessment & Plan Note (Signed)
No currrent statin drug

## 2017-08-27 LAB — COMPREHENSIVE METABOLIC PANEL
AG RATIO: 1.3 (calc) (ref 1.0–2.5)
ALBUMIN MSPROF: 4.4 g/dL (ref 3.6–5.1)
ALKALINE PHOSPHATASE (APISO): 143 U/L — AB (ref 40–115)
ALT: 31 U/L (ref 9–46)
AST: 35 U/L (ref 10–40)
BILIRUBIN TOTAL: 0.6 mg/dL (ref 0.2–1.2)
BUN: 14 mg/dL (ref 7–25)
CALCIUM: 9.7 mg/dL (ref 8.6–10.3)
CO2: 33 mmol/L — ABNORMAL HIGH (ref 20–32)
Chloride: 95 mmol/L — ABNORMAL LOW (ref 98–110)
Creat: 1.14 mg/dL (ref 0.60–1.35)
Globulin: 3.3 g/dL (calc) (ref 1.9–3.7)
Glucose, Bld: 149 mg/dL — ABNORMAL HIGH (ref 65–99)
POTASSIUM: 4.5 mmol/L (ref 3.5–5.3)
Sodium: 137 mmol/L (ref 135–146)
Total Protein: 7.7 g/dL (ref 6.1–8.1)

## 2017-08-27 LAB — CBC WITH DIFFERENTIAL/PLATELET
BASOS ABS: 43 {cells}/uL (ref 0–200)
Basophils Relative: 0.7 %
Eosinophils Absolute: 159 cells/uL (ref 15–500)
Eosinophils Relative: 2.6 %
HEMATOCRIT: 38.2 % — AB (ref 38.5–50.0)
HEMOGLOBIN: 13.2 g/dL (ref 13.2–17.1)
LYMPHS ABS: 763 {cells}/uL — AB (ref 850–3900)
MCH: 32.4 pg (ref 27.0–33.0)
MCHC: 34.6 g/dL (ref 32.0–36.0)
MCV: 93.6 fL (ref 80.0–100.0)
MPV: 9.9 fL (ref 7.5–12.5)
Monocytes Relative: 13.4 %
NEUTROS ABS: 4319 {cells}/uL (ref 1500–7800)
NEUTROS PCT: 70.8 %
Platelets: 326 10*3/uL (ref 140–400)
RBC: 4.08 10*6/uL — AB (ref 4.20–5.80)
RDW: 12.9 % (ref 11.0–15.0)
Total Lymphocyte: 12.5 %
WBC: 6.1 10*3/uL (ref 3.8–10.8)
WBCMIX: 817 {cells}/uL (ref 200–950)

## 2017-08-27 LAB — LIPID PANEL
CHOLESTEROL: 237 mg/dL — AB (ref <200)
HDL: 85 mg/dL (ref >40)
LDL CHOLESTEROL (CALC): 138 mg/dL — AB
Non-HDL Cholesterol (Calc): 152 mg/dL (calc) — ABNORMAL HIGH (ref <130)
Total CHOL/HDL Ratio: 2.8 (calc) (ref <5.0)
Triglycerides: 57 mg/dL (ref <150)

## 2017-08-27 LAB — HEMOGLOBIN A1C
HEMOGLOBIN A1C: 6.2 %{Hb} — AB (ref <5.7)
MEAN PLASMA GLUCOSE: 131 (calc)
eAG (mmol/L): 7.3 (calc)

## 2017-09-09 ENCOUNTER — Other Ambulatory Visit: Payer: Self-pay | Admitting: *Deleted

## 2017-09-09 MED ORDER — OXYCODONE HCL 5 MG PO TABS: 5.0000 mg | ORAL_TABLET | Freq: Three times a day (TID) | ORAL | 0 refills | Status: DC | PRN

## 2017-09-09 NOTE — Telephone Encounter (Signed)
Received call from patient.   Requested refill on Oxycodone.   Ok to refill??  Last office visit/ refill 08/26/2017.

## 2017-10-23 ENCOUNTER — Other Ambulatory Visit (HOSPITAL_COMMUNITY): Payer: Self-pay | Admitting: Cardiology

## 2017-10-23 DIAGNOSIS — I5022 Chronic systolic (congestive) heart failure: Secondary | ICD-10-CM

## 2017-10-24 ENCOUNTER — Other Ambulatory Visit: Payer: Self-pay | Admitting: *Deleted

## 2017-10-24 NOTE — Telephone Encounter (Addendum)
Received call from patient.  Requested refill on Oxycodone/APAP.   Ok to refill??  Last office visit 08/26/2017.  Last refill 09/09/2017.

## 2017-10-25 MED ORDER — OXYCODONE HCL 5 MG PO TABS: 5.0000 mg | ORAL_TABLET | Freq: Three times a day (TID) | ORAL | 0 refills | Status: DC | PRN

## 2017-11-11 ENCOUNTER — Other Ambulatory Visit (HOSPITAL_COMMUNITY): Payer: Self-pay | Admitting: Internal Medicine

## 2017-11-27 ENCOUNTER — Other Ambulatory Visit: Payer: Self-pay | Admitting: *Deleted

## 2017-11-27 MED ORDER — OXYCODONE HCL 5 MG PO TABS: 5.0000 mg | ORAL_TABLET | Freq: Three times a day (TID) | ORAL | 0 refills | Status: DC | PRN

## 2017-11-27 NOTE — Telephone Encounter (Signed)
Received call from patient.   Requested refill on Oxycodone.   Ok to refill??  Last office visit 08/26/2017.  Last refill 10/25/2017.

## 2017-12-27 ENCOUNTER — Other Ambulatory Visit: Payer: Self-pay | Admitting: *Deleted

## 2017-12-27 MED ORDER — OXYCODONE HCL 5 MG PO TABS: 5.0000 mg | ORAL_TABLET | Freq: Three times a day (TID) | ORAL | 0 refills | Status: DC | PRN

## 2017-12-27 NOTE — Telephone Encounter (Signed)
Ok to refill??  Last office visit 08/26/2017.  Last refill 11/27/2017.

## 2018-01-11 ENCOUNTER — Other Ambulatory Visit (HOSPITAL_COMMUNITY): Payer: Self-pay | Admitting: Internal Medicine

## 2018-01-14 ENCOUNTER — Other Ambulatory Visit (HOSPITAL_COMMUNITY): Payer: Self-pay | Admitting: Internal Medicine

## 2018-01-27 ENCOUNTER — Other Ambulatory Visit: Payer: Self-pay | Admitting: *Deleted

## 2018-01-27 MED ORDER — OXYCODONE HCL 5 MG PO TABS: 5.0000 mg | ORAL_TABLET | Freq: Three times a day (TID) | ORAL | 0 refills | Status: DC | PRN

## 2018-01-27 NOTE — Telephone Encounter (Signed)
Received call from patient.   Requested refill on Oxycodone.   Ok to refill??  Last office visit 08/26/2017.  Last refill 12/27/2017.

## 2018-02-11 ENCOUNTER — Encounter: Payer: Medicare HMO | Admitting: *Deleted

## 2018-02-13 ENCOUNTER — Other Ambulatory Visit (HOSPITAL_COMMUNITY): Payer: Self-pay | Admitting: Cardiology

## 2018-02-13 DIAGNOSIS — I5022 Chronic systolic (congestive) heart failure: Secondary | ICD-10-CM

## 2018-02-26 ENCOUNTER — Encounter: Payer: Medicare HMO | Admitting: Family Medicine

## 2018-02-26 ENCOUNTER — Encounter: Payer: Medicare HMO | Admitting: Physician Assistant

## 2018-02-27 ENCOUNTER — Other Ambulatory Visit: Payer: Self-pay | Admitting: *Deleted

## 2018-02-27 MED ORDER — OXYCODONE HCL 5 MG PO TABS: 5.0000 mg | ORAL_TABLET | Freq: Three times a day (TID) | ORAL | 0 refills | Status: DC | PRN

## 2018-02-27 NOTE — Telephone Encounter (Signed)
Received call from patient.   Requested refill on Oxycodone.   Ok to refill??  Last office visit 08/26/2017.  Last refill 01/27/2018.

## 2018-03-05 ENCOUNTER — Encounter: Payer: Self-pay | Admitting: Physician Assistant

## 2018-03-05 ENCOUNTER — Ambulatory Visit (INDEPENDENT_AMBULATORY_CARE_PROVIDER_SITE_OTHER): Payer: Medicare HMO | Admitting: Physician Assistant

## 2018-03-05 VITALS — BP 110/78 | HR 90 | Temp 98.1°F | Resp 16 | Ht 67.0 in | Wt 146.0 lb

## 2018-03-05 DIAGNOSIS — Z23 Encounter for immunization: Secondary | ICD-10-CM | POA: Diagnosis not present

## 2018-03-05 DIAGNOSIS — I5022 Chronic systolic (congestive) heart failure: Secondary | ICD-10-CM | POA: Diagnosis not present

## 2018-03-05 DIAGNOSIS — E785 Hyperlipidemia, unspecified: Secondary | ICD-10-CM

## 2018-03-05 DIAGNOSIS — R7302 Impaired glucose tolerance (oral): Secondary | ICD-10-CM | POA: Diagnosis not present

## 2018-03-05 DIAGNOSIS — I420 Dilated cardiomyopathy: Secondary | ICD-10-CM | POA: Diagnosis not present

## 2018-03-05 DIAGNOSIS — Z0000 Encounter for general adult medical examination without abnormal findings: Secondary | ICD-10-CM | POA: Diagnosis not present

## 2018-03-05 NOTE — Progress Notes (Signed)
Subjective:   [name] is a 47 y.o. male who presents for Medicare Annual/Subsequent preventive examination.  Cardiac Risk Factors include: Other (see comment), Risk factor comments: heart failure  He sees cardiology routinely.  He has known nonischemic cardiomyopathy.    Objective:    Vitals: BP 110/78   Pulse 90   Temp 98.1 F (36.7 C) (Oral)   Resp 16   Ht 5' 7" (1.702 m)   Wt 66.2 kg   SpO2 98%   BMI 22.87 kg/m   Body mass index is 22.87 kg/m.  Advanced Directives 03/05/2018 06/08/2017 04/08/2017 02/12/2017 10/11/2016 05/08/2016 04/01/2014  Does Patient Have a Medical Advance Directive? _0  No No  Would patient like information on creating a medical advance directive? No - Patient declined No - Patient declined No - Patient declined No - Patient declined - No - Patient declined -  Pre-existing out of facility DNR order (yellow form or pink MOST form) - - - - - - -   Today I showed him a hand out that covers the following topics----- advanced directives, living will, healthcare power of attorney-----asked him if he is already been given this information or if he would like this information to take him to review.  He replies that his son and his daughter know what to do if something happens to him and that he does not need this handout and notify he took it he would just throw it in the trash so defers this information today.  Tobacco Social History   Tobacco Use  Smoking Status Former Smoker  . Packs/day: 0.25  . Years: 20.00  . Pack years: 5.00  . Types: Cigarettes  . Last attempt to quit: 09/07/2012  . Years since quitting: 5.4  Smokeless Tobacco Current User  . Types: Snuff           Past Medical History:  Diagnosis Date  . AICD (automatic cardioverter/defibrillator) present   . Arthritis   . Cardiomyopathy, nonischemic (HCC)    takes Digoxin daily and Carvedilol daily  . CHF (congestive heart failure) (HCC)    takes Lasix daily as well  Aldactone  . Chronic back pain   . Chronic systolic heart failure ([place])   . Depression    takes Prozac daily  . GERD (gastroesophageal reflux disease)    takes Omeprazole daily  . Hx of Alcohol consumption heavy    rare beer currently  . hx of Tobacco abuse    quit smoking 2014  . Hyperlipidemia    was on Simvastatin but has been off a year  . Insomnia    takes Ambien as needed(just got script yesterday)  . Orthostatic hypotension   . Scoliosis   . 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 and chemo)   Past Surgical History:  Procedure Laterality Date  . CARPAL TUNNEL RELEASE Right 01/09/2013   Procedure: CARPAL TUNNEL RELEASE;  Surgeon: Winfield Cunas, MD;  Location: [facility];  Service: Neurosurgery;  Laterality: Right;  RIGHT carpal tunnel release  . CARPAL TUNNEL RELEASE Left 01/30/2013   Procedure: LEFT CARPAL TUNNEL RELEASE;  Surgeon: Winfield Cunas, MD;  Location: [facility];  Service: Neurosurgery;  Laterality: Left;  LEFT Carpal Tunnel release  . CHEST TUBE INSERTION Right 09/09/2013   Procedure: INSERTION PLEURAL DRAINAGE CATHETER;  Surgeon: Ivin Poot, MD;  Location: [facility];  Service: Thoracic;  Laterality: Right;  . HYDROCELE EXCISION Right 04/11/2017  Procedure: RIGHT HYDROCELECTOMY ADULT;  Surgeon: Irine Seal, MD;  Location: [facility];  Service: Urology;  Laterality: Right;  . hydrocelectomy  2008  . ICD  05/25/2011   [employer] End[employer]ak [employer] SG lead/Energen single chamber device  . IMPLANTABLE CARDIOVERTER DEFIBRILLATOR IMPLANT N/A 05/25/2011   Procedure: IMPLANTABLE CARDIOVERTER DEFIBRILLATOR IMPLANT;  Surgeon: Evans Lance, MD;  Location: [facility] CATH LAB;  Service: Cardiovascular;  Laterality: N/A;  . NEPHRECTOMY  36 yrs ago   Wilms Tumor  . RIGHT/LEFT HEART CATH AND CORONARY ANGIOGRAPHY N/A 02/12/2017   Procedure: RIGHT/LEFT HEART CATH AND CORONARY ANGIOGRAPHY;  Surgeon: Jolaine Artist, MD;  Location: [facility] CV LAB;  Service: Cardiovascular;   Laterality: N/A;  . ULNAR NERVE TRANSPOSITION Right 01/09/2013   Procedure: ULNAR NERVE DECOMPRESSION/TRANSPOSITION;  Surgeon: Winfield Cunas, MD;  Location: [facility];  Service: Neurosurgery;  Laterality: Right;  RIGHT ulnar nerve decompression   Family History  Problem Relation Age of Onset  . Diabetes Mother    Social History   Socioeconomic History  . Marital status: Single    Spouse name: Not on file  . Number of children: 1  . Years of education: Not on file  . Highest education level: Not on file  Occupational History  . Occupation: Full time    Comment: Engineer, civil (consulting)  Social Needs  . Financial resource strain: Not on file  . Food insecurity:    Worry: Not on file    Inability: Not on file  . Transportation needs:    Medical: Not on file    Non-medical: Not on file  Tobacco Use  . Smoking status: Former Smoker    Packs/day: 0.25    Years: 20.00    Pack years: 5.00    Types: Cigarettes    Last attempt to quit: 09/07/2012    Years since quitting: 5.4  . Smokeless tobacco: Current User    Types: Snuff  Substance and Sexual Activity  . Alcohol use: Yes    Alcohol/week: 0.0 standard drinks    Comment: histoorically a heavy drinker ("beer only")  . Drug use: No  . Sexual activity: Not Currently  Lifestyle  . Physical activity:    Days per week: Not on file    Minutes per session: Not on file  . Stress: Not on file  Relationships  . Social connections:    Talks on phone: Not on file    Gets together: Not on file    Attends religious service: Not on file    Active member of club or organization: Not on file    Attends meetings of clubs or organizations: Not on file    Relationship status: Not on file  Other Topics Concern  . Not on file  Social History Narrative  . Not on file    Outpatient Encounter Medications as of 03/05/2018  Medication Sig  . aspirin 81 MG tablet Take 81 mg by mouth daily.    . Blood Glucose Monitoring Suppl (BLOOD GLUCOSE SYSTEM  PAK) KIT Please dispense based on patient and insurance preference. Use to monitor FSBS 2-3x weekly. Dx: E11.9.  Marland Kitchen carvedilol (COREG) 3.125 MG tablet TAKE ONE TABLET BY MOUTH TWICE DAILY WITH A MEAL  . cyclobenzaprine (FLEXERIL) 10 MG tablet Take 1 tablet (10 mg total) by mouth 3 (three) times daily as needed for muscle spasms.  . Glucose Blood (BLOOD GLUCOSE TEST STRIPS) STRP Please dispense based on patient and insurance preference. Use to monitor FSBS 2-3x weekly. Dx:  E11.9.  . Lancet Devices MISC Please dispense based on patient and insurance preference. Use to monitor FSBS 2-3x weekly. Dx: E11.9.  . Lancets MISC Please dispense based on patient and insurance preference. Use to monitor FSBS 2-3x weekly. Dx: E11.9.  . Multiple Vitamin (MULTIVITAMIN WITH MINERALS) TABS tablet Take 1 tablet by mouth daily.  Marland Kitchen oxyCODONE (OXY IR/ROXICODONE) 5 MG immediate release tablet Take 1 tablet (5 mg total) by mouth 3 (three) times daily as needed for severe pain.  . sacubitril-valsartan (ENTRESTO) 49-51 MG Take 1 tablet by mouth 2 (two) times daily.  . sodium chloride (OCEAN) 0.65 % SOLN nasal spray Place 1 spray into both nostrils as needed for congestion.  Marland Kitchen spironolactone (ALDACTONE) 25 MG tablet Take 1 tablet (25 mg total) by mouth daily.  Marland Kitchen torsemide (DEMADEX) 20 MG tablet TAKE 1 TABLET BY MOUTH ONCE DAILY   No facility-administered encounter medications on file as of 03/05/2018.     Activities of Daily Living In your present state of health, do you have any difficulty performing the following activities: 03/05/2018 04/08/2017  Hearing? N N  Vision? N N  Difficulty concentrating or making decisions? N N  Walking or climbing stairs? N N  Dressing or bathing? N N  Doing errands, shopping? N N  Preparing Food and eating ? N -  Using the Toilet? N -  In the past six months, have you accidently leaked urine? N -  Do you have problems with loss of bowel control? N -  Managing your Medications? N -    Managing your Finances? N -  Housekeeping or managing your Housekeeping? N -  Some recent data might be hidden    Patient Care Team: [facility], Modena Nunnery, MD as PCP - General (Family Medicine) Evans Lance, MD as Consulting Physician (Cardiology)   Assessment:   This is a routine wellness examination for Sang.    Fall Risk Fall Risk  03/05/2018 08/26/2017 02/25/2017 08/22/2016 01/23/2016  Falls in the past year? No No Yes No No  Number falls in past yr: - - 1 - -  Injury with Fall? - - No - -   Is the patient's home free of loose throw rugs in walkways, pet beds, electrical cords, etc?   yes      Grab bars in the bathroom? no      Handrails on the stairs?   no      Adequate lighting?   yes   Depression Screen PHQ 2/9 Scores 03/05/2018 08/26/2017 03/12/2017 02/25/2017  PHQ - 2 Score 0 0 0 0  PHQ- 9 Score - - - 0    Cognitive Function     6CIT Screen 03/05/2018  What Year? 0 points  What month? 0 points  Count back from 20 0 points  Months in reverse 2 points  Repeat phrase 2 points    Immunization History  Administered Date(s) Administered  . Influenza Split 05/26/2011, 02/29/2012  . Influenza,inj,Quad PF,6+ Mos 03/16/2014, 03/18/2015, 04/25/2016, 02/25/2017  . Pneumococcal Polysaccharide-23 11/01/2010  . Tdap 02/25/2017     Screening Tests Health Maintenance  Topic Date Due  . INFLUENZA VACCINE  01/09/2018  . TETANUS/TDAP  02/26/2027  . HIV Screening  Completed     He is only age 47.  Not indicated for colonoscopy or PSA check yet. Immunizations are up-to-date. Other screenings are up-to-date.      Plan:     I have personally reviewed and noted the following in the patient's chart:   .  Medical and social history . Use of alcohol, tobacco or illicit drugs  . Current medications and supplements . Functional ability and status . Nutritional status . Physical activity . Advanced directives--He defers . List of other physicians . Vitals . Screenings  to include cognitive, depression, and falls . Referrals and appointments      [facility], PA-C  03/05/2018

## 2018-03-06 LAB — COMPLETE METABOLIC PANEL WITH GFR
AG RATIO: 1.4 (calc) (ref 1.0–2.5)
ALBUMIN MSPROF: 4.1 g/dL (ref 3.6–5.1)
ALT: 18 U/L (ref 9–46)
AST: 28 U/L (ref 10–40)
Alkaline phosphatase (APISO): 222 U/L — ABNORMAL HIGH (ref 40–115)
BILIRUBIN TOTAL: 1.7 mg/dL — AB (ref 0.2–1.2)
BUN: 14 mg/dL (ref 7–25)
CALCIUM: 9.6 mg/dL (ref 8.6–10.3)
CHLORIDE: 93 mmol/L — AB (ref 98–110)
CO2: 33 mmol/L — ABNORMAL HIGH (ref 20–32)
Creat: 1.13 mg/dL (ref 0.60–1.35)
GFR, EST AFRICAN AMERICAN: 91 mL/min/{1.73_m2} (ref >=60)
GFR, EST NON AFRICAN AMERICAN: 79 mL/min/{1.73_m2} (ref >=60)
GLOBULIN: 3 g/dL (ref 1.9–3.7)
Glucose, Bld: 118 mg/dL — ABNORMAL HIGH (ref 65–99)
Potassium: 4.1 mmol/L (ref 3.5–5.3)
Sodium: 136 mmol/L (ref 135–146)
Total Protein: 7.1 g/dL (ref 6.1–8.1)

## 2018-03-06 LAB — HEMOGLOBIN A1C
Hgb A1c MFr Bld: 6.5 % of total Hgb — ABNORMAL HIGH (ref <5.7)
Mean Plasma Glucose: 140 (calc)
eAG (mmol/L): 7.7 (calc)

## 2018-03-24 ENCOUNTER — Ambulatory Visit (INDEPENDENT_AMBULATORY_CARE_PROVIDER_SITE_OTHER): Payer: Medicare HMO | Admitting: *Deleted

## 2018-03-24 DIAGNOSIS — I420 Dilated cardiomyopathy: Secondary | ICD-10-CM

## 2018-03-25 NOTE — Progress Notes (Signed)
Remote ICD transmission.   

## 2018-03-27 ENCOUNTER — Encounter: Payer: Self-pay | Admitting: Cardiology

## 2018-03-28 ENCOUNTER — Other Ambulatory Visit: Payer: Self-pay | Admitting: *Deleted

## 2018-03-28 MED ORDER — OXYCODONE HCL 5 MG PO TABS: 5.0000 mg | ORAL_TABLET | Freq: Three times a day (TID) | ORAL | 0 refills | Status: DC | PRN

## 2018-03-28 NOTE — Telephone Encounter (Signed)
Received call from patient.   Requested refill on Oxycodone/APAP.   Ok to refill??  Last office visit 03/05/2018.  Last refill 02/27/2018.

## 2018-04-16 LAB — CUP PACEART REMOTE DEVICE CHECK
Implantable Lead Implant Date: 20121214
Implantable Pulse Generator Implant Date: 20121214
MDC IDC LEAD LOCATION: 753860
MDC IDC LEAD SERIAL: 307189
MDC IDC SESS DTM: 20191106124909
Pulse Gen Serial Number: 118994

## 2018-04-25 ENCOUNTER — Other Ambulatory Visit (HOSPITAL_COMMUNITY): Payer: Self-pay | Admitting: Internal Medicine

## 2018-04-29 ENCOUNTER — Other Ambulatory Visit: Payer: Self-pay | Admitting: *Deleted

## 2018-04-29 MED ORDER — OXYCODONE HCL 5 MG PO TABS: 5.0000 mg | ORAL_TABLET | Freq: Three times a day (TID) | ORAL | 0 refills | Status: DC | PRN

## 2018-04-29 NOTE — Telephone Encounter (Signed)
Received call from patient.   Requested refill on Oxycodone.   Ok to refill??  Last office visit 03/05/2018.  Last refill 03/28/2018.

## 2018-05-29 ENCOUNTER — Other Ambulatory Visit: Payer: Self-pay | Admitting: *Deleted

## 2018-05-29 NOTE — Telephone Encounter (Signed)
Received call from patient.   Requested refill on Oxycodone.   Ok to refill??  Last office visit 03/05/2018.  Last refill 04/29/2018.

## 2018-05-30 MED ORDER — OXYCODONE HCL 5 MG PO TABS: 5.0000 mg | ORAL_TABLET | Freq: Three times a day (TID) | ORAL | 0 refills | Status: DC | PRN

## 2018-06-05 ENCOUNTER — Other Ambulatory Visit (HOSPITAL_COMMUNITY): Payer: Self-pay | Admitting: Cardiology

## 2018-06-05 DIAGNOSIS — I5022 Chronic systolic (congestive) heart failure: Secondary | ICD-10-CM

## 2018-06-09 ENCOUNTER — Ambulatory Visit: Payer: Medicare HMO | Admitting: Family Medicine

## 2018-06-17 ENCOUNTER — Ambulatory Visit (INDEPENDENT_AMBULATORY_CARE_PROVIDER_SITE_OTHER): Payer: Medicare HMO | Admitting: Family Medicine

## 2018-06-17 ENCOUNTER — Other Ambulatory Visit: Payer: Self-pay

## 2018-06-17 ENCOUNTER — Encounter: Payer: Self-pay | Admitting: Family Medicine

## 2018-06-17 VITALS — BP 132/70 | HR 60 | Temp 97.8°F | Resp 14 | Ht 67.0 in | Wt 150.0 lb

## 2018-06-17 DIAGNOSIS — E785 Hyperlipidemia, unspecified: Secondary | ICD-10-CM | POA: Diagnosis not present

## 2018-06-17 DIAGNOSIS — I420 Dilated cardiomyopathy: Secondary | ICD-10-CM | POA: Diagnosis not present

## 2018-06-17 DIAGNOSIS — K7460 Unspecified cirrhosis of liver: Secondary | ICD-10-CM

## 2018-06-17 DIAGNOSIS — I5022 Chronic systolic (congestive) heart failure: Secondary | ICD-10-CM

## 2018-06-17 DIAGNOSIS — R7302 Impaired glucose tolerance (oral): Secondary | ICD-10-CM | POA: Diagnosis not present

## 2018-06-17 MED ORDER — FLUTICASONE PROPIONATE 50 MCG/ACT NA SUSP: 2.0000 | Freq: Every day | NASAL | 6 refills | Status: DC

## 2018-06-17 MED ORDER — CETIRIZINE HCL 10 MG PO TABS: 10.0000 mg | ORAL_TABLET | Freq: Every day | ORAL | 11 refills | Status: DC

## 2018-06-17 NOTE — Assessment & Plan Note (Signed)
Abstaining from alcohol in order to help preserve his liver function His liver function tests which have been elevated in the setting of the cirrhosis.  He is not following up with GI at this time

## 2018-06-17 NOTE — Patient Instructions (Addendum)
We will reschedule your cardiology visit with CHF clinic  F/U 4 months

## 2018-06-17 NOTE — Progress Notes (Signed)
   Subjective:    Patient ID: [name], male    DOB: 1971-03-12, 48 y.o.   MRN: [id number]  Patient presents for Follow-up (is fasting) and Nasal Congestion (x weeks)    Pt here to f/u chronic medical problems      Nasal congestion- he has been stopped up a lot, no drainage, used nasal saline over the counter, feels well otherwise, no cough, no fever  , using humidifier    He does have wood burning stove   Chronic pain he is still taking his hydrocodone as prescribed  Ischemic cardiomyopathy/congestive heart failure-somehow there was a rescheduling problem and he did not have a follow-up with his general cardiologist since last January.  He has a follow-up with [facility] in February.  He needs a new referral to reestablish care.  He has been getting his remote pacer checks done regularly. He has not had any side effects with his cardiac medications        Review Of Systems:  GEN- denies fatigue, fever, weight loss,weakness, recent illness HEENT- denies eye drainage, change in vision, nasal discharge, CVS- denies chest pain, palpitations RESP- denies SOB, cough, wheeze ABD- denies N/V, change in stools, abd pain GU- denies dysuria, hematuria, dribbling, incontinence MSK- denies joint pain, muscle aches, injury Neuro- denies headache, dizziness, syncope, seizure activity       Objective:    BP 132/70   Pulse 60   Temp 97.8 F (36.6 C) (Oral)   Resp 14   Ht 5\' 7"  (1.702 m)   Wt 150 lb (68 kg)   SpO2 99%   BMI 23.49 kg/m  GEN- NAD, alert and oriented x3 HEENT- PERRL, EOMI, non injected sclera, pink conjunctiva, MMM, oropharynx clear, nares clear, mild enlarged turbinates, no maxillary sinus tenderness  Neck- Supple, no thyromegaly, no LAD CVS- RRR, no murmur  Defibrillator RESP-CTAB ABD-NABS,soft,NT,ND EXT- No edema Pulses- Radial, DP- 2+        Assessment & Plan:      Problem List Items Addressed This Visit      Unprioritized   Chronic systolic  congestive heart failure (HCC)   Relevant Orders   Comprehensive metabolic panel   Ambulatory referral to Cardiology   Cirrhosis of liver ([place]) - Primary    Abstaining from alcohol in order to help preserve his liver function His liver function tests which have been elevated in the setting of the cirrhosis.  He is not following up with GI at this time      Relevant Orders   Comprehensive metabolic panel   Glucose intolerance (impaired glucose tolerance)    Check A1c he has a very poor diet states that he eats a lot of cookies and cakes and drinks [facility] his last A1c was at 6.5% 3 months ago.      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  He will also follow-up with [facility].  His blood pressure is controlled no sign of fluid retention no changes to medications today.      Relevant Orders   Ambulatory referral to Cardiology      Note: This dictation was prepared with Dragon dictation along with smaller phrase technology. Any transcriptional errors that result from this process are unintentional.

## 2018-06-17 NOTE — Assessment & Plan Note (Signed)
Check A1c he has a very poor diet states that he eats a lot of cookies and cakes and drinks [facility] his last A1c was at 6.5% 3 months ago.

## 2018-06-17 NOTE — Assessment & Plan Note (Signed)
Going to be reestablished with his local cardiologist at the CHF clinic.  He will also follow-up with [facility].  His blood pressure is controlled no sign of fluid retention no changes to medications today.

## 2018-06-18 LAB — COMPREHENSIVE METABOLIC PANEL
AG RATIO: 1.5 (calc) (ref 1.0–2.5)
ALT: 13 U/L (ref 9–46)
AST: 25 U/L (ref 10–40)
Albumin: 4.2 g/dL (ref 3.6–5.1)
Alkaline phosphatase (APISO): 256 U/L — ABNORMAL HIGH (ref 40–115)
BUN: 16 mg/dL (ref 7–25)
CHLORIDE: 96 mmol/L — AB (ref 98–110)
CO2: 31 mmol/L (ref 20–32)
Calcium: 9.3 mg/dL (ref 8.6–10.3)
Creat: 1.13 mg/dL (ref 0.60–1.35)
GLOBULIN: 2.8 g/dL (ref 1.9–3.7)
Glucose, Bld: 119 mg/dL — ABNORMAL HIGH (ref 65–99)
Potassium: 4.1 mmol/L (ref 3.5–5.3)
Sodium: 138 mmol/L (ref 135–146)
Total Bilirubin: 1.4 mg/dL — ABNORMAL HIGH (ref 0.2–1.2)
Total Protein: 7 g/dL (ref 6.1–8.1)

## 2018-06-18 LAB — CBC WITH DIFFERENTIAL/PLATELET
Absolute Monocytes: 545 cells/uL (ref 200–950)
BASOS ABS: 32 {cells}/uL (ref 0–200)
Basophils Relative: 0.7 %
EOS ABS: 108 {cells}/uL (ref 15–500)
Eosinophils Relative: 2.4 %
HCT: 40 % (ref 38.5–50.0)
Hemoglobin: 13.7 g/dL (ref 13.2–17.1)
Lymphs Abs: 567 cells/uL — ABNORMAL LOW (ref 850–3900)
MCH: 32.6 pg (ref 27.0–33.0)
MCHC: 34.3 g/dL (ref 32.0–36.0)
MCV: 95.2 fL (ref 80.0–100.0)
MONOS PCT: 12.1 %
MPV: 10.3 fL (ref 7.5–12.5)
NEUTROS ABS: 3249 {cells}/uL (ref 1500–7800)
NEUTROS PCT: 72.2 %
PLATELETS: 246 10*3/uL (ref 140–400)
RBC: 4.2 10*6/uL (ref 4.20–5.80)
RDW: 13.5 % (ref 11.0–15.0)
Total Lymphocyte: 12.6 %
WBC: 4.5 10*3/uL (ref 3.8–10.8)

## 2018-06-18 LAB — LIPID PANEL
Cholesterol: 172 mg/dL (ref <200)
HDL: 39 mg/dL — AB (ref >40)
LDL CHOLESTEROL (CALC): 117 mg/dL — AB
NON-HDL CHOLESTEROL (CALC): 133 mg/dL — AB (ref <130)
TRIGLYCERIDES: 66 mg/dL (ref <150)
Total CHOL/HDL Ratio: 4.4 (calc) (ref <5.0)

## 2018-06-18 LAB — HEMOGLOBIN A1C
EAG (MMOL/L): 7.4 (calc)
Hgb A1c MFr Bld: 6.3 % of total Hgb — ABNORMAL HIGH (ref <5.7)
MEAN PLASMA GLUCOSE: 134 (calc)

## 2018-06-19 ENCOUNTER — Encounter: Payer: Self-pay | Admitting: *Deleted

## 2018-06-23 ENCOUNTER — Ambulatory Visit (INDEPENDENT_AMBULATORY_CARE_PROVIDER_SITE_OTHER): Payer: Medicare HMO

## 2018-06-23 DIAGNOSIS — I420 Dilated cardiomyopathy: Secondary | ICD-10-CM | POA: Diagnosis not present

## 2018-06-24 NOTE — Progress Notes (Signed)
Remote ICD transmission.   

## 2018-06-26 LAB — CUP PACEART REMOTE DEVICE CHECK
Battery Remaining Longevity: 84 mo
Date Time Interrogation Session: 20200113060100
HIGH POWER IMPEDANCE MEASURED VALUE: 74 Ohm
Implantable Lead Implant Date: 20121214
Implantable Lead Location: 753860
Implantable Lead Model: 180
Implantable Lead Serial Number: 307189
Implantable Pulse Generator Implant Date: 20121214
Lead Channel Pacing Threshold Amplitude: 0.7 V
Lead Channel Pacing Threshold Pulse Width: 0.4 ms
Lead Channel Setting Pacing Amplitude: 2.4 V
Lead Channel Setting Pacing Pulse Width: 0.4 ms
MDC IDC MSMT BATTERY REMAINING PERCENTAGE: 88 %
MDC IDC MSMT LEADCHNL RV IMPEDANCE VALUE: 546 Ohm
MDC IDC SET LEADCHNL RV SENSING SENSITIVITY: 0.4 mV
MDC IDC STAT BRADY RV PERCENT PACED: 0 %
Pulse Gen Serial Number: 118994

## 2018-06-30 ENCOUNTER — Other Ambulatory Visit: Payer: Self-pay | Admitting: *Deleted

## 2018-06-30 MED ORDER — OXYCODONE HCL 5 MG PO TABS: 5.0000 mg | ORAL_TABLET | Freq: Three times a day (TID) | ORAL | 0 refills | Status: DC | PRN

## 2018-06-30 NOTE — Telephone Encounter (Signed)
Received call from patient.   Requested refill on Oxycodone.   Ok to refill??  Last office visit 06/17/2018.  Last refill 05/30/2018.

## 2018-07-01 ENCOUNTER — Other Ambulatory Visit (HOSPITAL_COMMUNITY): Payer: Self-pay | Admitting: Cardiology

## 2018-07-01 DIAGNOSIS — I5022 Chronic systolic (congestive) heart failure: Secondary | ICD-10-CM

## 2018-07-01 NOTE — Telephone Encounter (Signed)
Pt needs appointment for future refills 

## 2018-07-27 ENCOUNTER — Other Ambulatory Visit (HOSPITAL_COMMUNITY): Payer: Self-pay | Admitting: Cardiology

## 2018-07-27 DIAGNOSIS — I5022 Chronic systolic (congestive) heart failure: Secondary | ICD-10-CM

## 2018-07-31 ENCOUNTER — Other Ambulatory Visit: Payer: Self-pay | Admitting: *Deleted

## 2018-07-31 NOTE — Telephone Encounter (Signed)
Received call from patient.   Requested refill on Oxycodone.   Ok to refill??  Last office visit 06/17/2018.  Last refill 06/30/2018.

## 2018-08-01 MED ORDER — OXYCODONE HCL 5 MG PO TABS: 5.0000 mg | ORAL_TABLET | Freq: Three times a day (TID) | ORAL | 0 refills | Status: DC | PRN

## 2018-08-13 DIAGNOSIS — I5022 Chronic systolic (congestive) heart failure: Secondary | ICD-10-CM | POA: Diagnosis not present

## 2018-08-23 ENCOUNTER — Other Ambulatory Visit (HOSPITAL_COMMUNITY): Payer: Self-pay | Admitting: Cardiology

## 2018-08-23 DIAGNOSIS — I5022 Chronic systolic (congestive) heart failure: Secondary | ICD-10-CM

## 2018-08-26 ENCOUNTER — Other Ambulatory Visit (HOSPITAL_COMMUNITY): Payer: Self-pay | Admitting: Cardiology

## 2018-08-26 DIAGNOSIS — I5022 Chronic systolic (congestive) heart failure: Secondary | ICD-10-CM

## 2018-08-29 ENCOUNTER — Other Ambulatory Visit: Payer: Self-pay | Admitting: Family Medicine

## 2018-08-29 MED ORDER — OXYCODONE HCL 5 MG PO TABS: 5.0000 mg | ORAL_TABLET | Freq: Three times a day (TID) | ORAL | 0 refills | Status: DC | PRN

## 2018-08-29 NOTE — Telephone Encounter (Signed)
Last Filled 08/01/2018 Last office visit 06/17/2018

## 2018-09-03 ENCOUNTER — Telehealth (HOSPITAL_COMMUNITY): Payer: Self-pay | Admitting: Vascular Surgery

## 2018-09-03 NOTE — Telephone Encounter (Signed)
Called pt to reschedule 4/2 app w/ db appt move to 6/4, pt states he is doing ok, but the is having some fluid build up in his feet he wants to know if he needs to take more fluid pills.. please advise

## 2018-09-04 ENCOUNTER — Other Ambulatory Visit: Payer: Self-pay

## 2018-09-04 ENCOUNTER — Ambulatory Visit (HOSPITAL_COMMUNITY)
Admission: RE | Admit: 2018-09-04 | Discharge: 2018-09-04 | Disposition: A | Discharge status: Home / Self Care | Payer: Medicare HMO | Source: Ambulatory Visit | Attending: Cardiology | Admitting: Cardiology

## 2018-09-04 ENCOUNTER — Telehealth (HOSPITAL_COMMUNITY): Payer: Self-pay

## 2018-09-04 DIAGNOSIS — I5022 Chronic systolic (congestive) heart failure: Secondary | ICD-10-CM | POA: Diagnosis not present

## 2018-09-04 DIAGNOSIS — I420 Dilated cardiomyopathy: Secondary | ICD-10-CM

## 2018-09-04 NOTE — Progress Notes (Signed)
Heart Failure TeleHealth Note  Due to national recommendations of social distancing due to [place] 19, Audio/video telehealth visit is felt to be most appropriate for this patient at this time.  See MyChart message from today for patient consent regarding telehealth for [facility].  Date:  09/04/2018   ID:  [name], DOB [birth year], MRN [id number]  Location: Home  Provider location: [address] [place] Type of Visit: Acute Visit  PCP:  Alycia Rossetti, MD  Cardiologist:  No primary care provider on file. Primary HF: Dr Haroldine Laws   Chief Complaint: Heart Failure   History of Present Illness: [name] is a 48 y.o. male who presents via audio/video conferencing for a telehealth visit today.    Mr Rotan is 48 year old with history of NICM (diagnosed 10/2010 - norm cors 2012 and 8/56), chronic systolic heart failure EF 15% (08/2013), S/P ICD [employer], smoker, Wilms tumor s/p nephrectomy at age 49 had chemoradiation, scoliosis.    Has been evaluated at [facility] by Dr Mosetta Pigeon for possible transplant. Underwent L/RHC on 02/12/17. Had normal coronaries and relatively well-compensated hemodynamics. EF 20%.   Today, he denies symptoms of palpitations, chest pain, shortness of breath, orthopnea, PND, claudication, dizziness, presyncope, syncope, or bleeding. Says he has been having some leg swelling. Appetite ok. Eating fast food and chips.  He has not been weighing at home. The patient is tolerating medications without difficulties.    He denies symptoms of cough, fevers, chills, or new SOB worrisome for COVID 19.    Past Medical History:  Diagnosis Date  . AICD (automatic cardioverter/defibrillator) present   . Arthritis   . Cardiomyopathy, nonischemic (HCC)    takes Digoxin daily and Carvedilol daily  . CHF (congestive heart failure) (HCC)    takes Lasix daily as well Aldactone  . Chronic back pain   . Chronic systolic heart failure ([place])   . Depression     takes Prozac daily  . GERD (gastroesophageal reflux disease)    takes Omeprazole daily  . Hx of Alcohol consumption heavy    rare beer currently  . hx of Tobacco abuse    quit smoking 2014  . Hyperlipidemia    was on Simvastatin but has been off a year  . Insomnia    takes Ambien as needed(just got script yesterday)  . Orthostatic hypotension   . Scoliosis   . 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 63 (XRT and chemo)   Past Surgical History:  Procedure Laterality Date  . CARPAL TUNNEL RELEASE Right 01/09/2013   Procedure: CARPAL TUNNEL RELEASE;  Surgeon: Winfield Cunas, MD;  Location: [place] NEURO ORS;  Service: Neurosurgery;  Laterality: Right;  RIGHT carpal tunnel release  . CARPAL TUNNEL RELEASE Left 01/30/2013   Procedure: LEFT CARPAL TUNNEL RELEASE;  Surgeon: Winfield Cunas, MD;  Location: [place] [facility]URO ORS;  Service: Neurosurgery;  Laterality: Left;  LEFT Carpal Tunnel release  . CHEST TUBE INSERTION Right 09/09/2013   Procedure: INSERTION PLEURAL DRAINAGE CATHETER;  Surgeon: Ivin Poot, MD;  Location: [place];  Service: Thoracic;  Laterality: Right;  . HYDROCELE EXCISION Right 04/11/2017   Procedure: RIGHT HYDROCELECTOMY ADULT;  Surgeon: Irine Seal, MD;  Location: [facility] ORS;  Service: Urology;  Laterality: Right;  . hydrocelectomy  2008  . ICD  05/25/2011   [employer] End[employer]ak [employer] SG lead/[employer]nergen single chamber device  . IMPLANTABLE CARDIOVERTER DEFIBRILLATOR IMPLANT N/A 05/25/2011   Procedure: IMPLANTABLE CARDIOVERTER  DEFIBRILLATOR IMPLANT;  Surgeon: Evans Lance, MD;  Location: [facility] CATH LAB;  Service: Cardiovascular;  Laterality: N/A;  . NEPHRECTOMY  36 yrs ago   Wilms Tumor  . RIGHT/LEFT HEART CATH AND CORONARY ANGIOGRAPHY N/A 02/12/2017   Procedure: RIGHT/LEFT HEART CATH AND CORONARY ANGIOGRAPHY;  Surgeon: Jolaine Artist, MD;  Location: [place] CV LAB;  Service: Cardiovascular;  Laterality: N/A;  . ULNAR NERVE TRANSPOSITION Right 01/09/2013   Procedure: ULNAR NERVE  DECOMPRESSION/TRANSPOSITION;  Surgeon: Winfield Cunas, MD;  Location: [place] NEURO ORS;  Service: Neurosurgery;  Laterality: Right;  RIGHT ulnar nerve decompression     Current Outpatient Medications  Medication Sig Dispense Refill  . aspirin 81 MG tablet Take 81 mg by mouth daily.      . Blood Glucose Monitoring Suppl (BLOOD GLUCOSE SYSTEM PAK) KIT Please dispense based on patient and insurance preference. Use to monitor FSBS 2-3x weekly. Dx: E11.9. 1 each 1  . carvedilol (COREG) 3.125 MG tablet TAKE ONE TABLET BY MOUTH TWICE DAILY WITH A MEAL 180 tablet 2  . cetirizine (ZYRTEC) 10 MG tablet Take 1 tablet (10 mg total) by mouth daily. 30 tablet 11  . cyclobenzaprine (FLEXERIL) 10 MG tablet Take 1 tablet (10 mg total) by mouth 3 (three) times daily as needed for muscle spasms. 12 tablet 0  . ENTRESTO 49-51 MG TAKE 1 TABLET BY MOUTH TWICE DAILY 180 tablet 0  . fluticasone (FLONASE) 50 MCG/ACT nasal spray Place 2 sprays into both nostrils daily. 16 g 6  . Glucose Blood (BLOOD GLUCOSE TEST STRIPS) STRP Please dispense based on patient and insurance preference. Use to monitor FSBS 2-3x weekly. Dx: E11.9. 50 each 1  . Lancet Devices MISC Please dispense based on patient and insurance preference. Use to monitor FSBS 2-3x weekly. Dx: E11.9. 1 each 1  . Lancets MISC Please dispense based on patient and insurance preference. Use to monitor FSBS 2-3x weekly. Dx: E11.9. 50 each 1  . Multiple Vitamin (MULTIVITAMIN WITH MINERALS) TABS tablet Take 1 tablet by mouth daily.    Marland Kitchen oxyCODONE (OXY IR/ROXICODONE) 5 MG immediate release tablet Take 1 tablet (5 mg total) by mouth 3 (three) times daily as needed for severe pain. 60 tablet 0  . sodium chloride (OCEAN) 0.65 % SOLN nasal spray Place 1 spray into both nostrils as needed for congestion.    Marland Kitchen spironolactone (ALDACTONE) 25 MG tablet Take 1 tablet (25 mg total) by mouth daily. 90 tablet 3  . torsemide (DEMADEX) 20 MG tablet Take 1 tablet by mouth once daily 30  tablet 0   No current facility-administered medications for this encounter.     Allergies:   Ibuprofen and Nsaids   Social History:  The patient  reports that he quit smoking about 5 years ago. His smoking use included cigarettes. He has a 5.00 pack-year smoking history. His smokeless tobacco use includes snuff. He reports current alcohol use. He reports that he does not use drugs.   Family History:  The patient's family history includes Diabetes in his mother.   ROS:  Please see the history of present illness.   All other systems are personally reviewed and negative.   Exam:  Tele Health Call; Exam is subjective General:  No resp difficulty. HEENT: Normal Neck:Pt denies tenderness.  Cor: regular pulse Lungs: Normal respiratory effort with conversation.  Abdomen: Non-distended. Pt denies tenderness with self palpation.  Extremities: Reports lower extremity edema. Neuro: Alert & oriented x 3.   Recent Labs: 06/17/2018: ALT  13; BUN 16; Creat 1.13; Hemoglobin 13.7; Platelets 246; Potassium 4.1; Sodium 138  Personally reviewed   Wt Readings from Last 3 Encounters:  06/17/18 68 kg (150 lb)  03/05/18 66.2 kg (146 lb)  08/26/17 66.2 kg (146 lb)      Other studies personally reviewed: Additional studies/ records that were reviewed today include: None  ASSESSMENT AND PLAN:  1.  Chronic Systolic Heart Failure. ECHO 2018 Ef 20%  NYHA II. Sounds volume overloaded in the setting of high sodium diet. I have asked him to limit fast food and to change his torsemide dose.  -He was instructed to take 40 mg torsemide today then 20 mg daily tomorrow. I want him to alternate 40 mg one day then 20 mg the next.  -He will continue current dose of bb, spironolactone, entresto  -He was asked to weigh everyday and call back if his weight goes up 3-5 pounds in 24 hours.    COVID screen The patient does not have any symptoms that suggest any further testing/ screening at this time.  Social distancing  reinforced today.  Recommended follow-up:  He has an appointment in June. Plan to check BMET at that time.   Relevant cardiac medications were reviewed at length with the patient today.   The patient does not have concerns regarding their medications at this time.   The following changes were made today:  Take 40 mg torsemide one day then next day 20 mg daily.   Labs/ tests ordered today include:none   Patient Risk: After full review of this patients clinical status, I feel that they are at moderate risk for cardiac decompensation at this time.  Today, I have spent 21 minutes with the patient with telehealth technology discussing heart failure and low salt diet.   Jeanmarie Hubert, NP  09/04/2018 11:43 AM  [facility] [address] [facility] [place] [address] [phone] (office) [phone] (fax)

## 2018-09-04 NOTE — Telephone Encounter (Signed)
D/w Amy NP, she will arrange for telehealth visit today to discuss patient concerns.

## 2018-09-04 NOTE — Addendum Note (Signed)
Encounter addended by: Jovita Kussmaul, RN on: 09/04/2018 12:13 PM  Actions taken: Clinical Note Signed

## 2018-09-04 NOTE — Patient Instructions (Signed)
Take 40 mg torsemide for one day then continue regular dose of 20 mg daily.   Please weigh every day.  Keep follow up appointment.

## 2018-09-04 NOTE — Telephone Encounter (Signed)
Patient was called by Arbutus Leas to reschedule his f/u. Pt endorsed to her that he has been having some swelling in his feet. When I called him, he reports that he has swelling only in right foot. No pain or discoloration of that leg or foot. He denies SOB or chest pain. Mild SOB with increasing activity. He currently takes Entresto 49/51mg  bid, spiro 25mg , and torsemide 20mg  daily. He says this has been going on for a couple weeks. He also notes that it is worse in the night and improves in the morning after rising.

## 2018-09-11 ENCOUNTER — Encounter (HOSPITAL_COMMUNITY): Payer: Medicare HMO | Admitting: Internal Medicine

## 2018-09-22 ENCOUNTER — Ambulatory Visit (INDEPENDENT_AMBULATORY_CARE_PROVIDER_SITE_OTHER): Payer: Medicare HMO | Admitting: *Deleted

## 2018-09-22 ENCOUNTER — Other Ambulatory Visit: Payer: Self-pay

## 2018-09-22 DIAGNOSIS — I420 Dilated cardiomyopathy: Secondary | ICD-10-CM | POA: Diagnosis not present

## 2018-09-22 LAB — CUP PACEART REMOTE DEVICE CHECK
Battery Remaining Longevity: 78 mo
Battery Remaining Percentage: 85 %
Brady Statistic RV Percent Paced: 0 %
Date Time Interrogation Session: 20200413052100
HighPow Impedance: 68 Ohm
Implantable Lead Implant Date: 20121214
Implantable Lead Location: 753860
Implantable Lead Model: 180
Implantable Lead Serial Number: 307189
Implantable Pulse Generator Implant Date: 20121214
Lead Channel Impedance Value: 556 Ohm
Lead Channel Pacing Threshold Amplitude: 0.7 V
Lead Channel Pacing Threshold Pulse Width: 0.4 ms
Lead Channel Setting Pacing Amplitude: 2.4 V
Lead Channel Setting Pacing Pulse Width: 0.4 ms
Lead Channel Setting Sensing Sensitivity: 0.4 mV
Pulse Gen Serial Number: 118994

## 2018-09-29 ENCOUNTER — Other Ambulatory Visit: Payer: Self-pay | Admitting: *Deleted

## 2018-09-29 MED ORDER — OXYCODONE HCL 5 MG PO TABS: 5.0000 mg | ORAL_TABLET | Freq: Three times a day (TID) | ORAL | 0 refills | Status: DC | PRN

## 2018-09-29 NOTE — Telephone Encounter (Signed)
Received call from patient.   Requested refill on Oxycodone.  Ok to refill??  Last office visit 06/17/2018.  Last refill 08/29/2018.

## 2018-10-03 ENCOUNTER — Encounter: Payer: Self-pay | Admitting: Cardiology

## 2018-10-03 NOTE — Progress Notes (Signed)
Remote ICD transmission.   

## 2018-10-17 ENCOUNTER — Ambulatory Visit: Payer: Medicare HMO | Admitting: Family Medicine

## 2018-10-17 ENCOUNTER — Other Ambulatory Visit (HOSPITAL_COMMUNITY): Payer: Self-pay | Admitting: Cardiology

## 2018-10-17 DIAGNOSIS — I5022 Chronic systolic (congestive) heart failure: Secondary | ICD-10-CM

## 2018-10-28 ENCOUNTER — Other Ambulatory Visit: Payer: Self-pay | Admitting: *Deleted

## 2018-10-28 MED ORDER — OXYCODONE HCL 5 MG PO TABS: 5.0000 mg | ORAL_TABLET | Freq: Three times a day (TID) | ORAL | 0 refills | Status: DC | PRN

## 2018-10-28 NOTE — Telephone Encounter (Signed)
Received call from patient.   Requested refill on Oxycodone.   Ok to refill??  Last office visit 06/17/2018.  Last refill 09/29/2018.

## 2018-11-13 ENCOUNTER — Encounter (HOSPITAL_COMMUNITY): Payer: Self-pay | Admitting: Internal Medicine

## 2018-11-13 ENCOUNTER — Other Ambulatory Visit: Payer: Self-pay

## 2018-11-13 ENCOUNTER — Ambulatory Visit (HOSPITAL_COMMUNITY)
Admission: RE | Admit: 2018-11-13 | Discharge: 2018-11-13 | Disposition: A | Discharge status: Home / Self Care | Payer: Medicare HMO | Source: Ambulatory Visit | Attending: Internal Medicine | Admitting: Internal Medicine

## 2018-11-13 VITALS — BP 116/86 | HR 81 | Wt 157.8 lb

## 2018-11-13 DIAGNOSIS — J90 Pleural effusion, not elsewhere classified: Secondary | ICD-10-CM | POA: Diagnosis not present

## 2018-11-13 DIAGNOSIS — Z7982 Long term (current) use of aspirin: Secondary | ICD-10-CM | POA: Diagnosis not present

## 2018-11-13 DIAGNOSIS — G8929 Other chronic pain: Secondary | ICD-10-CM | POA: Insufficient documentation

## 2018-11-13 DIAGNOSIS — M419 Scoliosis, unspecified: Secondary | ICD-10-CM | POA: Insufficient documentation

## 2018-11-13 DIAGNOSIS — M1990 Unspecified osteoarthritis, unspecified site: Secondary | ICD-10-CM | POA: Insufficient documentation

## 2018-11-13 DIAGNOSIS — E785 Hyperlipidemia, unspecified: Secondary | ICD-10-CM | POA: Insufficient documentation

## 2018-11-13 DIAGNOSIS — F329 Major depressive disorder, single episode, unspecified: Secondary | ICD-10-CM | POA: Diagnosis not present

## 2018-11-13 DIAGNOSIS — I5022 Chronic systolic (congestive) heart failure: Secondary | ICD-10-CM

## 2018-11-13 DIAGNOSIS — I428 Other cardiomyopathies: Secondary | ICD-10-CM | POA: Insufficient documentation

## 2018-11-13 DIAGNOSIS — G4700 Insomnia, unspecified: Secondary | ICD-10-CM | POA: Insufficient documentation

## 2018-11-13 DIAGNOSIS — Z79899 Other long term (current) drug therapy: Secondary | ICD-10-CM | POA: Insufficient documentation

## 2018-11-13 DIAGNOSIS — Z95810 Presence of automatic (implantable) cardiac defibrillator: Secondary | ICD-10-CM | POA: Diagnosis not present

## 2018-11-13 DIAGNOSIS — I5082 Biventricular heart failure: Secondary | ICD-10-CM | POA: Diagnosis not present

## 2018-11-13 DIAGNOSIS — M549 Dorsalgia, unspecified: Secondary | ICD-10-CM | POA: Insufficient documentation

## 2018-11-13 DIAGNOSIS — K219 Gastro-esophageal reflux disease without esophagitis: Secondary | ICD-10-CM | POA: Diagnosis not present

## 2018-11-13 DIAGNOSIS — Z87891 Personal history of nicotine dependence: Secondary | ICD-10-CM | POA: Insufficient documentation

## 2018-11-13 DIAGNOSIS — R69 Illness, unspecified: Secondary | ICD-10-CM | POA: Diagnosis not present

## 2018-11-13 LAB — BASIC METABOLIC PANEL
Anion gap: 13 (ref 5–15)
BUN: 16 mg/dL (ref 6–20)
CO2: 26 mmol/L (ref 22–32)
Calcium: 9.1 mg/dL (ref 8.9–10.3)
Chloride: 96 mmol/L — ABNORMAL LOW (ref 98–111)
Creatinine, Ser: 1.29 mg/dL — ABNORMAL HIGH (ref 0.61–1.24)
GFR calc Af Amer: >60 mL/min (ref >60)
GFR calc non Af Amer: >60 mL/min (ref >60)
Glucose, Bld: 121 mg/dL — ABNORMAL HIGH (ref 70–99)
Potassium: 4 mmol/L (ref 3.5–5.1)
Sodium: 135 mmol/L (ref 135–145)

## 2018-11-13 LAB — BRAIN NATRIURETIC PEPTIDE: B Natriuretic Peptide: 3846.1 pg/mL — ABNORMAL HIGH (ref 0.0–100.0)

## 2018-11-13 MED ORDER — POTASSIUM CHLORIDE CRYS ER 20 MEQ PO TBCR: 20.0000 meq | EXTENDED_RELEASE_TABLET | ORAL | 3 refills | Status: DC

## 2018-11-13 MED ORDER — METOLAZONE 2.5 MG PO TABS: 2.5000 mg | ORAL_TABLET | ORAL | 0 refills | Status: DC

## 2018-11-13 MED ORDER — TORSEMIDE 20 MG PO TABS: 40.0000 mg | ORAL_TABLET | Freq: Every day | ORAL | 3 refills | Status: DC

## 2018-11-13 NOTE — Progress Notes (Addendum)
Patient ID: [name], male   DOB: July 18, 1970, 48 y.o.   MRN: [id number]    Primary Cardiologist: Dr [name] HF MD: Dr [name] [name]: Dr [name]  HPI: Mr [name] is 48 year old with history of NICM (diagnosed 10/2010 - norm cors 2012 and 1/70), chronic systolic heart failure EF 15% (08/2013), S/P ICD [employer], smoker, Wilms tumor s/p nephrectomy at age 5 had chemoradiation, scoliosis.    We saw him at the end of March 2015 and had large recurrent R pleural effusion. Referred to Dr. Prescott [name] who placed Pleurex catheter on 4/10, removed on 09/28/13.   CPX (5/15) with RER 1.06, VO2 max 18.5, VE/VCO2 slope 32.1.  Moderate to severe functional limitation, circulatory.   CPX 09/17/16 Peak VO2: 20.5 (53% predicted peak VO2) Slope 35 Underwent L/RHC on 02/12/17. Had normal coronaries and relatively well-compensated hemodynamics. EF 20%.  Earlier this year we increased Entresto to 49/103.  Felt lightheaded and had stomach discomfort so he cut back.   Has been evaluated at [facility] by Dr [name] for possible transplant. [name] seen 08/13/18 and felt he was doing well and too early for transplant.   Here for routine f/u. Says he feels great. Quit dipping snuff in 1/19 and gained some weight after that. Weight has been about 150 since that time. Has been swelling in his legs and belly. No orthopnea or PND. Taking all meds like he should. Active fishing. Tried doubling torsemide from 20-> 40 and it didn't help much   ECHO 04/2014 EF 10% RV mildly dilated.   ECHO 2/18 EF 15% Mild MR RV mild HK. Severe TR. Triv MR . RV ok Echo 11/2016  EF 15% RV ok. Moderate to severe TR  Cath 02/12/17:  Normal cors  Ao = 91/60 (74) LV = 102/18 RA =  2 RV = 48/5 PA = 48/21 (31) PCW = 20 Fick cardiac output/index = 4.0/2.3 PVR = 2.8 WU Ao sat = 95% PA sat = 68%, 70% RA/PCWP = 0.10 PaPI = 13.5  CPX 09/17/16 Resting HR: 86 Peak HR: 150  (85% age predicted max HR) BP rest: 96/80 BP peak: 118/72 Peak VO2:  20.5 (53% predicted peak VO2) Ve/VCO2 slope: 35 OUES: 1.38 Peak RER: 1.04 Ventilatory Threshold: 15.7 (41% predicted or measured peak VO2) Peak RR 32 Peak Ventilation: 47.5 VE/MVV: 50% PETCO2 at peak: 30 O2pulse: 8  (62% predicted O2pulse)  CPX 5/15 Resting HR: 89 Peak HR: 138  (77% age predicted max HR) BP rest: 106/68 BP peak: 126/74 (IPE) Peak VO2: 18.5 (46.5% predicted peak VO2) VE/VCO2 slope: 32.1 OUES: 1.33 Peak RER: 1.06 Ventilatory Threshold: 13.5 (33.9% predicted peak VO2) Peak RR 31 Peak Ventilation: 37.1 VE/MVV: 62.7% PETCO2 at peak: 33 O2pulse: 7  (58% predicted O2pulse)  SH: Lives with his son 15 year old . Unemployed. Disability approved 3 years ago. Does not drink alcohol. Dips tobacco.   FH: Grandfather MI  ROS: All systems negative except as listed in HPI, PMH and Problem List.  Past Medical History:  Diagnosis Date   AICD (automatic cardioverter/defibrillator) present    Arthritis    Cardiomyopathy, nonischemic ([place])    takes Digoxin daily and Carvedilol daily   CHF (congestive heart failure) (HCC)    takes Lasix daily as well Aldactone   Chronic back pain    Chronic systolic heart failure ([place])    Depression    takes Prozac daily   GERD (gastroesophageal reflux disease)    takes Omeprazole daily  Hx of Alcohol consumption heavy    rare beer currently   hx of Tobacco abuse    quit smoking 2014   Hyperlipidemia    was on Simvastatin but has been off a year   Insomnia    takes Ambien as needed(just got script yesterday)   Orthostatic hypotension    Scoliosis    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 48 (XRT and chemo)    Current Outpatient Medications  Medication Sig Dispense Refill   aspirin 81 MG tablet Take 81 mg by mouth daily.       Blood Glucose Monitoring Suppl (BLOOD GLUCOSE SYSTEM PAK) KIT Please dispense based on patient and insurance preference. Use to monitor FSBS 2-3x weekly. Dx: E11.9. 1 each 1    carvedilol (COREG) 3.125 MG tablet TAKE ONE TABLET BY MOUTH TWICE DAILY WITH A MEAL 180 tablet 2   cetirizine (ZYRTEC) 10 MG tablet Take 1 tablet (10 mg total) by mouth daily. 30 tablet 11   cyclobenzaprine (FLEXERIL) 10 MG tablet Take 1 tablet (10 mg total) by mouth 3 (three) times daily as needed for muscle spasms. 12 tablet 0   ENTRESTO 49-51 MG TAKE 1 TABLET BY MOUTH TWICE DAILY 180 tablet 0   fluticasone (FLONASE) 50 MCG/ACT nasal spray Place 2 sprays into both nostrils daily. 16 g 6   Glucose Blood (BLOOD GLUCOSE TEST STRIPS) STRP Please dispense based on patient and insurance preference. Use to monitor FSBS 2-3x weekly. Dx: E11.9. 50 each 1   Lancet Devices MISC Please dispense based on patient and insurance preference. Use to monitor FSBS 2-3x weekly. Dx: E11.9. 1 each 1   Lancets MISC Please dispense based on patient and insurance preference. Use to monitor FSBS 2-3x weekly. Dx: E11.9. 50 each 1   Multiple Vitamin (MULTIVITAMIN WITH MINERALS) TABS tablet Take 1 tablet by mouth daily.     oxyCODONE (OXY IR/ROXICODONE) 5 MG immediate release tablet Take 1 tablet (5 mg total) by mouth 3 (three) times daily as needed for severe pain. 60 tablet 0   sodium chloride (OCEAN) 0.65 % SOLN nasal spray Place 1 spray into both nostrils as needed for congestion.     spironolactone (ALDACTONE) 25 MG tablet Take 1 tablet (25 mg total) by mouth daily. 90 tablet 3   torsemide (DEMADEX) 20 MG tablet Take 1 tablet by mouth once daily 90 tablet 0   No current facility-administered medications for this encounter.      PHYSICAL EXAM: Vitals:   11/13/18 1104  BP: 116/86  Pulse: 81  SpO2: 99%  Weight: 71.6 kg (157 lb 12.8 oz)   Filed Weights   11/13/18 1104  Weight: 71.6 kg (157 lb 12.8 oz)   General:  Well appearing. No resp difficulty HEENT: normal Neck: supple. JVP to jaw Carotids 2+ bilat; no bruits. No lymphadenopathy or thryomegaly appreciated. Cor: PMI laterally displaced. Regular  rate & rhythm. 3/6 TR. Lungs: clear Abdomen: soft, nontender, + distended. No hepatosplenomegaly. No bruits or masses. Good bowel sounds. Extremities: no cyanosis, clubbing, rash, 2-3+ edema Neuro: alert & orientedx3, cranial nerves grossly intact. moves all 4 extremities w/o difficulty. Affect pleasant  ASSESSMENT & PLAN: 1. Chronic Systolic HF:  Nonischemic cardiomyopathy, ECHO 03/2014 EF 10%. Echo 2/18 EF 15% RV mild HK  [employer] ICD.   - Echo 11/2016 LVEF 15%. RV mild HK moderate -severe TR - CPX test from 4/18 moderate functional impairment improved from 2015. - Cath 9/18: normal cors. Relatively well-compensated hemodynmaics - Overall  doing well despite persistent severe LV dysfunction.  - Symptomatically stable NYHA II but has worsening right heart failure with significant volume overload - Add metolazone 2.3m with KCl 20 x 2 days then increase torsemide to 40 daily - Check labs today. See back in clinic with echo next week. - Continue entresto to 49/51. Unable to tolerate 97/103 - Continue torsemide 20 daily.  - Continue carvedilol 3.125 bid - Unable to tolerate digoxin due to heartburn - CPX reviewed pVO2 ~ 50% predicted.Improved from previous. We discussed the fact that he continues to do well despite his severe cardiomyopathy. Will continue current therapy for now but at some point will likely need advanced therapies - ideally OHTx.  - Given small stature and blood type A-pos  - Will need repeat CPX when COVID restrictions permit  2. Severe TR -- Echo with moderate to severe TR but TV seems structurally intact. ? If related to ICD wire or just dilated annulus.  - repeat echo as above  Total time spent 40 minutes. Over half that time spent discussing above.    DGlori Bickers MD  11:13 AM

## 2018-11-13 NOTE — Patient Instructions (Addendum)
Lab work done today. We will notify you of any abnormal lab work. No news is good news!  START Metolazone 2.5mg  (1 tab) TODAY AND TOMORROW ONLY. Please take 50meq (1 tab) of Potassium both today and tomorrow only.  INCREASE Torsemide 40mg  (2 tabs) daily  Your physician has requested that you have an echocardiogram. Echocardiography is a painless test that uses sound waves to create images of your heart. It provides your doctor with information about the size and shape of your heart and how well your heart's chambers and valves are working. This procedure takes approximately one hour. There are no restrictions for this procedure. This will be done at your follow up appointment in 1 week.  Please follow up with Dr. Haroldine Laws in 1 week with an echocardiogram.  It was great seeing you today!!

## 2018-11-14 ENCOUNTER — Other Ambulatory Visit (HOSPITAL_COMMUNITY): Payer: Self-pay | Admitting: Internal Medicine

## 2018-11-17 ENCOUNTER — Ambulatory Visit: Payer: Medicaid Other | Admitting: Family Medicine

## 2018-11-21 ENCOUNTER — Other Ambulatory Visit: Payer: Self-pay

## 2018-11-21 ENCOUNTER — Ambulatory Visit (HOSPITAL_COMMUNITY)
Admission: RE | Admit: 2018-11-21 | Discharge: 2018-11-21 | Disposition: A | Discharge status: Home / Self Care | Payer: Medicare HMO | Source: Ambulatory Visit | Attending: Internal Medicine | Admitting: Internal Medicine

## 2018-11-21 ENCOUNTER — Encounter (HOSPITAL_COMMUNITY): Payer: Self-pay | Admitting: Internal Medicine

## 2018-11-21 ENCOUNTER — Telehealth (HOSPITAL_COMMUNITY): Payer: Self-pay

## 2018-11-21 ENCOUNTER — Other Ambulatory Visit (HOSPITAL_COMMUNITY): Payer: Self-pay

## 2018-11-21 ENCOUNTER — Ambulatory Visit (HOSPITAL_BASED_OUTPATIENT_CLINIC_OR_DEPARTMENT_OTHER)
Admission: RE | Admit: 2018-11-21 | Discharge: 2018-11-21 | Disposition: A | Discharge status: Home / Self Care | Payer: Medicare HMO | Source: Ambulatory Visit | Attending: Internal Medicine | Admitting: Internal Medicine

## 2018-11-21 VITALS — BP 122/86 | HR 83 | Wt 148.4 lb

## 2018-11-21 DIAGNOSIS — E785 Hyperlipidemia, unspecified: Secondary | ICD-10-CM | POA: Insufficient documentation

## 2018-11-21 DIAGNOSIS — Z8249 Family history of ischemic heart disease and other diseases of the circulatory system: Secondary | ICD-10-CM | POA: Diagnosis not present

## 2018-11-21 DIAGNOSIS — Z85528 Personal history of other malignant neoplasm of kidney: Secondary | ICD-10-CM | POA: Diagnosis not present

## 2018-11-21 DIAGNOSIS — Z7982 Long term (current) use of aspirin: Secondary | ICD-10-CM | POA: Insufficient documentation

## 2018-11-21 DIAGNOSIS — Z95810 Presence of automatic (implantable) cardiac defibrillator: Secondary | ICD-10-CM | POA: Insufficient documentation

## 2018-11-21 DIAGNOSIS — G4700 Insomnia, unspecified: Secondary | ICD-10-CM | POA: Diagnosis not present

## 2018-11-21 DIAGNOSIS — M419 Scoliosis, unspecified: Secondary | ICD-10-CM | POA: Insufficient documentation

## 2018-11-21 DIAGNOSIS — Z79899 Other long term (current) drug therapy: Secondary | ICD-10-CM | POA: Diagnosis not present

## 2018-11-21 DIAGNOSIS — I5082 Biventricular heart failure: Secondary | ICD-10-CM | POA: Insufficient documentation

## 2018-11-21 DIAGNOSIS — Z7951 Long term (current) use of inhaled steroids: Secondary | ICD-10-CM | POA: Insufficient documentation

## 2018-11-21 DIAGNOSIS — Z905 Acquired absence of kidney: Secondary | ICD-10-CM | POA: Insufficient documentation

## 2018-11-21 DIAGNOSIS — I509 Heart failure, unspecified: Secondary | ICD-10-CM

## 2018-11-21 DIAGNOSIS — I428 Other cardiomyopathies: Secondary | ICD-10-CM | POA: Diagnosis not present

## 2018-11-21 DIAGNOSIS — I5022 Chronic systolic (congestive) heart failure: Secondary | ICD-10-CM | POA: Diagnosis not present

## 2018-11-21 DIAGNOSIS — Z87891 Personal history of nicotine dependence: Secondary | ICD-10-CM | POA: Insufficient documentation

## 2018-11-21 LAB — BASIC METABOLIC PANEL
Anion gap: 14 (ref 5–15)
BUN: 27 mg/dL — ABNORMAL HIGH (ref 6–20)
CO2: 27 mmol/L (ref 22–32)
Calcium: 9.5 mg/dL (ref 8.9–10.3)
Chloride: 85 mmol/L — ABNORMAL LOW (ref 98–111)
Creatinine, Ser: 1.18 mg/dL (ref 0.61–1.24)
GFR calc Af Amer: >60 mL/min (ref >60)
GFR calc non Af Amer: >60 mL/min (ref >60)
Glucose, Bld: 127 mg/dL — ABNORMAL HIGH (ref 70–99)
Potassium: 4.4 mmol/L (ref 3.5–5.1)
Sodium: 126 mmol/L — ABNORMAL LOW (ref 135–145)

## 2018-11-21 LAB — BRAIN NATRIURETIC PEPTIDE: B Natriuretic Peptide: 4368.9 pg/mL — ABNORMAL HIGH (ref 0.0–100.0)

## 2018-11-21 NOTE — Progress Notes (Signed)
Echocardiogram 2D Echocardiogram has been performed.  Oneal Deputy [name] 11/21/2018, 10:52 AM

## 2018-11-21 NOTE — Telephone Encounter (Signed)
Pt called to received labs. Reviewed lab work with pt. Pt verbalized understanding of labs and agreeable to follow up appointments.

## 2018-11-21 NOTE — Progress Notes (Signed)
Patient ID: [name], male   DOB: July 29, 1970, 48 y.o.   MRN: [id number]    Primary Cardiologist: Dr Domenic Polite HF MD: Dr Haroldine Laws [name]: Dr Mosetta Pigeon  HPI: [name] is 48 year old with history of NICM (diagnosed 10/2010 - norm cors 2012 and 1/82), chronic systolic heart failure EF 15% (08/2013), S/P ICD [employer], smoker, Wilms tumor s/p nephrectomy at age 72 had chemoradiation, scoliosis.    We saw him at the end of March 2015 and had large recurrent R pleural effusion. Referred to Dr. Prescott Gum who placed Pleurex catheter on 4/10, removed on 09/28/13.   CPX (5/15) with RER 1.06, VO2 max 18.5, VE/VCO2 slope 32.1.  Moderate to severe functional limitation, circulatory.   CPX 09/17/16 Peak VO2: 20.5 (53% predicted peak VO2) Slope 35 Underwent L/RHC on 02/12/17. Had normal coronaries and relatively well-compensated hemodynamics. EF 20%.  Earlier this year we increased Entresto to 2/103.  Felt lightheaded and had stomach discomfort so he cut back.   Has been evaluated at [facility] by Dr Mosetta Pigeon for possible transplant. [name] seen 08/13/18 and felt he was doing well and too early for transplant.   Here for routine f/u. We saw him last week and weight was up with significant LE edema. We gave him metolazone for 2 days without much affect then doubled toresemide 40 daily. Lost 10 pounds. Weight now leveling out. Feels great. Fishing and doing all his activities without problem. Edema resolved.   Echo today EF 15% RV mild HK with severe TR Personally reviewed   ECHO 04/2014 EF 10% RV mildly dilated.   ECHO 2/18 EF 15% Mild MR RV mild HK. Severe TR. Triv MR . RV ok Echo 11/2016  EF 15% RV ok. Moderate to severe TR  Cath 02/12/17:  Normal cors  Ao = 91/60 (74) LV = 102/18 RA =  2 RV = 48/5 PA = 48/21 (31) PCW = 20 Fick cardiac output/index = 4.0/2.3 PVR = 2.8 WU Ao sat = 95% PA sat = 68%, 70% RA/PCWP = 0.10 PaPI = 13.5  CPX 09/17/16 Resting HR: 86 Peak HR: 150  (85% age predicted max  HR) BP rest: 96/80 BP peak: 118/72 Peak VO2: 20.5 (53% predicted peak VO2) Ve/VCO2 slope: 35 OUES: 1.38 Peak RER: 1.04 Ventilatory Threshold: 15.7 (41% predicted or measured peak VO2) Peak RR 32 Peak Ventilation: 47.5 VE/MVV: 50% PETCO2 at peak: 30 O2pulse: 8  (62% predicted O2pulse)  CPX 5/15 Resting HR: 89 Peak HR: 138  (77% age predicted max HR) BP rest: 106/68 BP peak: 126/74 (IPE) Peak VO2: 18.5 (46.5% predicted peak VO2) VE/VCO2 slope: 32.1 OUES: 1.33 Peak RER: 1.06 Ventilatory Threshold: 13.5 (33.9% predicted peak VO2) Peak RR 31 Peak Ventilation: 37.1 VE/MVV: 62.7% PETCO2 at peak: 33 O2pulse: 7  (58% predicted O2pulse)  SH: Lives with his son 49 year old . Unemployed. Disability approved 3 years ago. Does not drink alcohol. Dips tobacco.   FH: Grandfather MI  ROS: All systems negative except as listed in HPI, PMH and Problem List.  Past Medical History:  Diagnosis Date  . AICD (automatic cardioverter/defibrillator) present   . Arthritis   . Cardiomyopathy, nonischemic (HCC)    takes Digoxin daily and Carvedilol daily  . CHF (congestive heart failure) (HCC)    takes Lasix daily as well Aldactone  . Chronic back pain   . Chronic systolic heart failure ([place])   . Depression    takes Prozac daily  . GERD (gastroesophageal reflux disease)  takes Omeprazole daily  . Hx of Alcohol consumption heavy    rare beer currently  . hx of Tobacco abuse    quit smoking 2014  . Hyperlipidemia    was on Simvastatin but has been off a year  . Insomnia    takes Ambien as needed(just got script yesterday)  . Orthostatic hypotension   . Scoliosis   . 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 47 (XRT and chemo)    Current Outpatient Medications  Medication Sig Dispense Refill  . aspirin 81 MG tablet Take 81 mg by mouth daily.      . Blood Glucose Monitoring Suppl (BLOOD GLUCOSE SYSTEM PAK) KIT Please dispense based on patient and insurance preference. Use to monitor  FSBS 2-3x weekly. Dx: E11.9. 1 each 1  . carvedilol (COREG) 3.125 MG tablet TAKE ONE TABLET BY MOUTH TWICE DAILY WITH A MEAL 180 tablet 2  . cetirizine (ZYRTEC) 10 MG tablet Take 1 tablet (10 mg total) by mouth daily. 30 tablet 11  . cyclobenzaprine (FLEXERIL) 10 MG tablet Take 1 tablet (10 mg total) by mouth 3 (three) times daily as needed for muscle spasms. 12 tablet 0  . ENTRESTO 49-51 MG Take 1 tablet by mouth twice daily 180 tablet 3  . fluticasone (FLONASE) 50 MCG/ACT nasal spray Place 2 sprays into both nostrils daily. 16 g 6  . Glucose Blood (BLOOD GLUCOSE TEST STRIPS) STRP Please dispense based on patient and insurance preference. Use to monitor FSBS 2-3x weekly. Dx: E11.9. 50 each 1  . Lancet Devices MISC Please dispense based on patient and insurance preference. Use to monitor FSBS 2-3x weekly. Dx: E11.9. 1 each 1  . Lancets MISC Please dispense based on patient and insurance preference. Use to monitor FSBS 2-3x weekly. Dx: E11.9. 50 each 1  . metolazone (ZAROXOLYN) 2.5 MG tablet Take 1 tablet (2.5 mg total) by mouth as directed. By the heart failure clinic 15 tablet 0  . Multiple Vitamin (MULTIVITAMIN WITH MINERALS) TABS tablet Take 1 tablet by mouth daily.    Marland Kitchen oxyCODONE (OXY IR/ROXICODONE) 5 MG immediate release tablet Take 1 tablet (5 mg total) by mouth 3 (three) times daily as needed for severe pain. 60 tablet 0  . potassium chloride SA (K-DUR) 20 MEQ tablet Take 1 tablet (20 mEq total) by mouth as directed. Take1 tab with Metalazone 90 tablet 3  . sodium chloride (OCEAN) 0.65 % SOLN nasal spray Place 1 spray into both nostrils as needed for congestion.    Marland Kitchen spironolactone (ALDACTONE) 25 MG tablet Take 1 tablet (25 mg total) by mouth daily. 90 tablet 3  . torsemide (DEMADEX) 20 MG tablet Take 2 tablets (40 mg total) by mouth daily. 180 tablet 3   No current facility-administered medications for this encounter.      PHYSICAL EXAM: Vitals:   11/21/18 1054  BP: 122/86  Pulse: 83   SpO2: 98%  Weight: 67.3 kg (148 lb 6.4 oz)   Filed Weights   11/21/18 1054  Weight: 67.3 kg (148 lb 6.4 oz)   General:  Well appearing. No resp difficulty HEENT: normal Neck: supple. JVP to jaw with prominent CV waves. Carotids 2+ bilat; no bruits. No lymphadenopathy or thryomegaly appreciated. Cor: PMI nondisplaced. Regular rate & rhythm. No rubs, gallops or murmurs. Lungs: clear Abdomen: soft, nontender, + distended. No hepatosplenomegaly. No bruits or masses. Good bowel sounds. Extremities: no cyanosis, clubbing, rash, edema Neuro: alert & orientedx3, cranial nerves grossly intact. moves all 4 extremities w/o  difficulty. Affect pleasant   ASSESSMENT & PLAN: 1. Chronic Systolic HF:  Nonischemic cardiomyopathy, ECHO 03/2014 EF 10%. Echo 2/18 EF 15% RV mild HK  [employer] ICD.   - Echo 11/2016 LVEF 15%. RV mild HK moderate -severe TR - CPX test from 4/18 moderate functional impairment improved from 2015. - Cath 9/18: normal cors. Relatively well-compensated hemodynmaics - Overall doing well despite persistent severe LV dysfunction.  - Symptomatically stable NYHA II - At last visit had worsening right heart failure with significant volume overload - Volume status improved with increased torsemide to 40 daily - Continue entresto to 49/51. Unable to tolerate 97/103 - Continue carvedilol 3.125 bid - Unable to tolerate digoxin due to heartburn - CPX reviewed pVO2 ~ 50% predicted.Improved from previous. We discussed the fact that he continues to do well despite his severe cardiomyopathy. Will continue current therapy for now but at some point will likely need advanced therapies - ideally OHTx given small stature and blood type A-pos (has seen Dr. Mosetta Pigeon at [facility] already) - Will need repeat CPX ASAP when COVID restrictions permit  2. Severe TR -- Echo with moderate to severe TR but TV seems structurally intact. ? If related to ICD wire or just dilated annulus.   Glori Bickers, MD   11:47 AM

## 2018-11-21 NOTE — Patient Instructions (Signed)
Routine lab work today. Will notify you of abnormal results  Your provider requests you have a CPX  (we will call you to schedule)  Follow up in 2 months with Dr.[name]

## 2018-11-21 NOTE — Telephone Encounter (Signed)
Left VM to schedule appt, 18 June for repeat BMET, left call back # if appt did not fit his schedule

## 2018-11-26 ENCOUNTER — Ambulatory Visit (INDEPENDENT_AMBULATORY_CARE_PROVIDER_SITE_OTHER): Payer: Medicare HMO | Admitting: Family Medicine

## 2018-11-26 ENCOUNTER — Other Ambulatory Visit: Payer: Self-pay

## 2018-11-26 ENCOUNTER — Encounter: Payer: Self-pay | Admitting: Family Medicine

## 2018-11-26 VITALS — BP 128/70 | HR 86 | Temp 98.6°F | Resp 14 | Ht 67.0 in | Wt 144.0 lb

## 2018-11-26 DIAGNOSIS — G894 Chronic pain syndrome: Secondary | ICD-10-CM | POA: Diagnosis not present

## 2018-11-26 DIAGNOSIS — R7302 Impaired glucose tolerance (oral): Secondary | ICD-10-CM | POA: Diagnosis not present

## 2018-11-26 DIAGNOSIS — K7460 Unspecified cirrhosis of liver: Secondary | ICD-10-CM | POA: Diagnosis not present

## 2018-11-26 DIAGNOSIS — I509 Heart failure, unspecified: Secondary | ICD-10-CM | POA: Diagnosis not present

## 2018-11-26 DIAGNOSIS — E785 Hyperlipidemia, unspecified: Secondary | ICD-10-CM

## 2018-11-26 MED ORDER — OXYCODONE HCL 5 MG PO TABS: 5.0000 mg | ORAL_TABLET | Freq: Three times a day (TID) | ORAL | 0 refills | Status: DC | PRN

## 2018-11-26 NOTE — Assessment & Plan Note (Signed)
On diuretics, trending LFT, no jaundice

## 2018-11-26 NOTE — Patient Instructions (Addendum)
F/U 4 months for Physical , okay to schedule his son same day, put back to back

## 2018-11-26 NOTE — Assessment & Plan Note (Signed)
Oxycodone as prescribed

## 2018-11-26 NOTE — Progress Notes (Signed)
   Subjective:    Patient ID: [name], male    DOB: November 25, 1970, 48 y.o.   MRN: [id number]  Patient presents for Follow-up (is fasting) and Medication Refill (Oxycodone (last RF 10/28/2018))  Here to follow-up chronic medical problems.  Medications reviewed. Followed by cardiology for his systolic heart failure he has a defibrillator in place. He recently had fluid retention weight was up to  157, demadex was increaed to 40mg  He did have metalazone for 2 days Planning for stress test, has already had repeat Echo  Needs repeat renal function and potassium He feels good, no SOB,no chest pain, leg swelling has gone awau    Chronic pain he has been maintained on oxycodone is been compliant with his medication. He has cirrhosis of the liver which is why we are avoiding acetaminophen.  Ghose intolerance his last A1c was 6.3% in January it had improved with dietary changes.    Review Of Systems:  GEN- denies fatigue, fever, weight loss,weakness, recent illness HEENT- denies eye drainage, change in vision, nasal discharge, CVS- denies chest pain, palpitations RESP- denies SOB, cough, wheeze ABD- denies N/V, change in stools, abd pain GU- denies dysuria, hematuria, dribbling, incontinence MSK- + joint pain, muscle aches, injury Neuro- denies headache, dizziness, syncope, seizure activity       Objective:    BP 128/70   Pulse 86   Temp 98.6 F (37 C) (Oral)   Resp 14   Ht 5\' 7"  (1.702 m)   Wt 144 lb (65.3 kg)   SpO2 97%   BMI 22.55 kg/m  GEN- NAD, alert and oriented x3 HEENT- PERRL, EOMI, non injected sclera, pink conjunctiva, MMM, oropharynx clear Neck- Supple, no thyromegaly CVS- RRR, no murmur, defibrillator RESP-CTAB ABD-NABS,soft,NT, mild distension EXT- No edema Pulses- Radial  2+        Assessment & Plan:      Problem List Items Addressed This Visit      Unprioritized   Chronic CHF (congestive heart failure) (HCC) - Primary    Significant weight  change with diuretics Medications per cardiology Recheck BMET       Relevant Orders   CBC with Differential/Platelet   Chronic pain syndrome    Oxycodone as prescribed      Relevant Medications   oxyCODONE (OXY IR/ROXICODONE) 5 MG immediate release tablet   Cirrhosis of liver (HCC)    On diuretics, trending LFT, no jaundice      Relevant Orders   CBC with Differential/Platelet   Comprehensive metabolic panel   Glucose intolerance (impaired glucose tolerance)    Recheck A1C      Relevant Orders   Hemoglobin A1c   Hyperlipidemia   Relevant Orders   Lipid panel      Note: This dictation was prepared with Dragon dictation along with smaller phrase technology. Any transcriptional errors that result from this process are unintentional.

## 2018-11-26 NOTE — Assessment & Plan Note (Signed)
Significant weight change with diuretics Medications per cardiology Recheck BMET

## 2018-11-26 NOTE — Assessment & Plan Note (Signed)
Recheck A1C 

## 2018-11-27 ENCOUNTER — Other Ambulatory Visit (HOSPITAL_COMMUNITY): Payer: Medicare HMO

## 2018-11-27 LAB — COMPREHENSIVE METABOLIC PANEL
AG Ratio: 1.1 (calc) (ref 1.0–2.5)
ALT: 21 U/L (ref 9–46)
AST: 30 U/L (ref 10–40)
Albumin: 4.5 g/dL (ref 3.6–5.1)
Alkaline phosphatase (APISO): 267 U/L — ABNORMAL HIGH (ref 36–130)
BUN/Creatinine Ratio: 25 (calc) — ABNORMAL HIGH (ref 6–22)
BUN: 33 mg/dL — ABNORMAL HIGH (ref 7–25)
CO2: 28 mmol/L (ref 20–32)
Calcium: 10.6 mg/dL — ABNORMAL HIGH (ref 8.6–10.3)
Chloride: 93 mmol/L — ABNORMAL LOW (ref 98–110)
Creat: 1.31 mg/dL (ref 0.60–1.35)
Globulin: 4.1 g/dL (calc) — ABNORMAL HIGH (ref 1.9–3.7)
Glucose, Bld: 115 mg/dL — ABNORMAL HIGH (ref 65–99)
Potassium: 5.5 mmol/L — ABNORMAL HIGH (ref 3.5–5.3)
Sodium: 135 mmol/L (ref 135–146)
Total Bilirubin: 1 mg/dL (ref 0.2–1.2)
Total Protein: 8.6 g/dL — ABNORMAL HIGH (ref 6.1–8.1)

## 2018-11-27 LAB — CBC WITH DIFFERENTIAL/PLATELET
Absolute Monocytes: 513 cells/uL (ref 200–950)
Basophils Absolute: 59 cells/uL (ref 0–200)
Basophils Relative: 1.3 %
Eosinophils Absolute: 162 cells/uL (ref 15–500)
Eosinophils Relative: 3.6 %
HCT: 42 % (ref 38.5–50.0)
Hemoglobin: 14 g/dL (ref 13.2–17.1)
Lymphs Abs: 563 cells/uL — ABNORMAL LOW (ref 850–3900)
MCH: 31.5 pg (ref 27.0–33.0)
MCHC: 33.3 g/dL (ref 32.0–36.0)
MCV: 94.6 fL (ref 80.0–100.0)
MPV: 10.1 fL (ref 7.5–12.5)
Monocytes Relative: 11.4 %
Neutro Abs: 3204 cells/uL (ref 1500–7800)
Neutrophils Relative %: 71.2 %
Platelets: 353 10*3/uL (ref 140–400)
RBC: 4.44 10*6/uL (ref 4.20–5.80)
RDW: 13.3 % (ref 11.0–15.0)
Total Lymphocyte: 12.5 %
WBC: 4.5 10*3/uL (ref 3.8–10.8)

## 2018-11-27 LAB — LIPID PANEL
Cholesterol: 185 mg/dL (ref <200)
HDL: 40 mg/dL (ref >=40)
LDL Cholesterol (Calc): 130 mg/dL (calc) — ABNORMAL HIGH
Non-HDL Cholesterol (Calc): 145 mg/dL (calc) — ABNORMAL HIGH (ref <130)
Total CHOL/HDL Ratio: 4.6 (calc) (ref <5.0)
Triglycerides: 62 mg/dL (ref <150)

## 2018-11-27 LAB — HEMOGLOBIN A1C
Hgb A1c MFr Bld: 6.3 % of total Hgb — ABNORMAL HIGH (ref <5.7)
Mean Plasma Glucose: 134 (calc)
eAG (mmol/L): 7.4 (calc)

## 2018-12-01 ENCOUNTER — Telehealth (HOSPITAL_COMMUNITY): Payer: Self-pay

## 2018-12-01 DIAGNOSIS — I5022 Chronic systolic (congestive) heart failure: Secondary | ICD-10-CM

## 2018-12-01 NOTE — Telephone Encounter (Signed)
LMTRC to schedule lab appt for BMET.

## 2018-12-01 NOTE — Telephone Encounter (Signed)
-----   Message from Jolaine Artist, MD sent at 11/29/2018 10:12 PM EDT ----- Please recheck BMET this week.

## 2018-12-03 NOTE — Telephone Encounter (Signed)
LM again for patient to call office back to schedule lab appt.

## 2018-12-03 NOTE — Telephone Encounter (Signed)
-----   Message from Jolaine Artist, MD sent at 11/29/2018 10:12 PM EDT ----- Please recheck BMET this week.

## 2018-12-03 NOTE — Telephone Encounter (Signed)
Spoke with patient, he will have labs done at [facility] on Friday 6/26. Rx faxed

## 2018-12-05 ENCOUNTER — Other Ambulatory Visit: Payer: Self-pay | Admitting: Internal Medicine

## 2018-12-05 DIAGNOSIS — I5022 Chronic systolic (congestive) heart failure: Secondary | ICD-10-CM | POA: Diagnosis not present

## 2018-12-06 LAB — BASIC METABOLIC PANEL
BUN/Creatinine Ratio: 28 — ABNORMAL HIGH (ref 9–20)
BUN: 30 mg/dL — ABNORMAL HIGH (ref 6–24)
CO2: 21 mmol/L (ref 20–29)
Calcium: 9.8 mg/dL (ref 8.7–10.2)
Chloride: 90 mmol/L — ABNORMAL LOW (ref 96–106)
Creatinine, Ser: 1.08 mg/dL (ref 0.76–1.27)
GFR calc Af Amer: 95 mL/min/{1.73_m2} (ref >59)
GFR calc non Af Amer: 82 mL/min/{1.73_m2} (ref >59)
Glucose: 126 mg/dL — ABNORMAL HIGH (ref 65–99)
Potassium: 5.1 mmol/L (ref 3.5–5.2)
Sodium: 128 mmol/L — ABNORMAL LOW (ref 134–144)

## 2018-12-06 LAB — SPECIMEN STATUS REPORT

## 2018-12-22 ENCOUNTER — Ambulatory Visit (INDEPENDENT_AMBULATORY_CARE_PROVIDER_SITE_OTHER): Payer: Medicare HMO | Admitting: *Deleted

## 2018-12-22 DIAGNOSIS — I509 Heart failure, unspecified: Secondary | ICD-10-CM | POA: Diagnosis not present

## 2018-12-22 LAB — CUP PACEART REMOTE DEVICE CHECK
Battery Remaining Longevity: 78 mo
Battery Remaining Percentage: 82 %
Brady Statistic RV Percent Paced: 0 %
Date Time Interrogation Session: 20200713051400
HighPow Impedance: 76 Ohm
Implantable Lead Implant Date: 20121214
Implantable Lead Location: 753860
Implantable Lead Model: 180
Implantable Lead Serial Number: 307189
Implantable Pulse Generator Implant Date: 20121214
Lead Channel Impedance Value: 544 Ohm
Lead Channel Pacing Threshold Amplitude: 0.7 V
Lead Channel Pacing Threshold Pulse Width: 0.4 ms
Lead Channel Setting Pacing Amplitude: 2.4 V
Lead Channel Setting Pacing Pulse Width: 0.4 ms
Lead Channel Setting Sensing Sensitivity: 0.4 mV
Pulse Gen Serial Number: 118994

## 2018-12-26 ENCOUNTER — Other Ambulatory Visit: Payer: Self-pay | Admitting: Family Medicine

## 2018-12-26 MED ORDER — OXYCODONE HCL 5 MG PO TABS: 5.0000 mg | ORAL_TABLET | Freq: Three times a day (TID) | ORAL | 0 refills | Status: DC | PRN

## 2019-01-05 NOTE — Progress Notes (Signed)
Remote ICD transmission.   

## 2019-01-09 ENCOUNTER — Telehealth (HOSPITAL_COMMUNITY): Payer: Self-pay | Admitting: *Deleted

## 2019-01-09 ENCOUNTER — Telehealth (HOSPITAL_COMMUNITY): Payer: Self-pay

## 2019-01-09 DIAGNOSIS — I5022 Chronic systolic (congestive) heart failure: Secondary | ICD-10-CM

## 2019-01-09 DIAGNOSIS — Z01810 Encounter for preprocedural cardiovascular examination: Secondary | ICD-10-CM

## 2019-01-09 NOTE — Telephone Encounter (Signed)
-----   Message from Zollie Beckers sent at 01/05/2019  4:32 PM EDT ----- Regarding: Order and Schedule COVID Test Hi!   Patient is scheduled for CPX Thursday 8/6 at 11am. Will you please order and contact patient to Schedule COVID for Monday 8/3.   Just let me know once this is complete,   Thank you!

## 2019-01-09 NOTE — Telephone Encounter (Signed)
Pt need order for covid test prior to cpx on Thursday. Called pt and scheduled test. Orders placed.

## 2019-01-09 NOTE — Telephone Encounter (Signed)
Called patient to inform him of CPX appointment 8/6 at 11am. Left voicemail with details. Will call again on Monday to remind patient of this appointment.     Landis Martins, MS, ACSM-RCEP Clinical Exercise Physiologist

## 2019-01-12 ENCOUNTER — Other Ambulatory Visit (HOSPITAL_COMMUNITY)
Admission: RE | Admit: 2019-01-12 | Discharge: 2019-01-12 | Disposition: A | Discharge status: Home / Self Care | Payer: Medicare HMO | Source: Ambulatory Visit | Attending: Internal Medicine | Admitting: Internal Medicine

## 2019-01-12 DIAGNOSIS — Z20828 Contact with and (suspected) exposure to other viral communicable diseases: Secondary | ICD-10-CM | POA: Insufficient documentation

## 2019-01-12 DIAGNOSIS — Z01812 Encounter for preprocedural laboratory examination: Secondary | ICD-10-CM | POA: Insufficient documentation

## 2019-01-12 LAB — SARS CORONAVIRUS 2 (TAT 6-24 HRS): SARS Coronavirus 2: NEGATIVE

## 2019-01-14 ENCOUNTER — Other Ambulatory Visit (HOSPITAL_COMMUNITY): Payer: Self-pay | Admitting: Internal Medicine

## 2019-01-15 ENCOUNTER — Ambulatory Visit (HOSPITAL_COMMUNITY): Payer: Medicare HMO | Attending: Family Medicine

## 2019-01-15 ENCOUNTER — Other Ambulatory Visit (HOSPITAL_COMMUNITY): Payer: Self-pay | Admitting: *Deleted

## 2019-01-15 ENCOUNTER — Other Ambulatory Visit: Payer: Self-pay

## 2019-01-15 DIAGNOSIS — I509 Heart failure, unspecified: Secondary | ICD-10-CM

## 2019-01-15 DIAGNOSIS — R9439 Abnormal result of other cardiovascular function study: Secondary | ICD-10-CM | POA: Insufficient documentation

## 2019-01-21 ENCOUNTER — Other Ambulatory Visit: Payer: Self-pay

## 2019-01-21 ENCOUNTER — Ambulatory Visit (HOSPITAL_COMMUNITY)
Admission: RE | Admit: 2019-01-21 | Discharge: 2019-01-21 | Disposition: A | Discharge status: Home / Self Care | Payer: Medicare HMO | Source: Ambulatory Visit | Attending: Internal Medicine | Admitting: Internal Medicine

## 2019-01-21 ENCOUNTER — Encounter (HOSPITAL_COMMUNITY): Payer: Self-pay | Admitting: Internal Medicine

## 2019-01-21 VITALS — BP 108/78 | HR 98 | Wt 148.2 lb

## 2019-01-21 DIAGNOSIS — Z9221 Personal history of antineoplastic chemotherapy: Secondary | ICD-10-CM | POA: Insufficient documentation

## 2019-01-21 DIAGNOSIS — Z95810 Presence of automatic (implantable) cardiac defibrillator: Secondary | ICD-10-CM | POA: Insufficient documentation

## 2019-01-21 DIAGNOSIS — I5022 Chronic systolic (congestive) heart failure: Secondary | ICD-10-CM | POA: Insufficient documentation

## 2019-01-21 DIAGNOSIS — I428 Other cardiomyopathies: Secondary | ICD-10-CM | POA: Diagnosis not present

## 2019-01-21 DIAGNOSIS — I071 Rheumatic tricuspid insufficiency: Secondary | ICD-10-CM | POA: Insufficient documentation

## 2019-01-21 DIAGNOSIS — R140 Abdominal distension (gaseous): Secondary | ICD-10-CM | POA: Diagnosis not present

## 2019-01-21 DIAGNOSIS — Z87891 Personal history of nicotine dependence: Secondary | ICD-10-CM | POA: Insufficient documentation

## 2019-01-21 DIAGNOSIS — E785 Hyperlipidemia, unspecified: Secondary | ICD-10-CM | POA: Insufficient documentation

## 2019-01-21 DIAGNOSIS — Z79899 Other long term (current) drug therapy: Secondary | ICD-10-CM | POA: Diagnosis not present

## 2019-01-21 DIAGNOSIS — G4700 Insomnia, unspecified: Secondary | ICD-10-CM | POA: Insufficient documentation

## 2019-01-21 DIAGNOSIS — Z85528 Personal history of other malignant neoplasm of kidney: Secondary | ICD-10-CM | POA: Diagnosis not present

## 2019-01-21 DIAGNOSIS — R188 Other ascites: Secondary | ICD-10-CM

## 2019-01-21 DIAGNOSIS — I420 Dilated cardiomyopathy: Secondary | ICD-10-CM

## 2019-01-21 DIAGNOSIS — Z8249 Family history of ischemic heart disease and other diseases of the circulatory system: Secondary | ICD-10-CM | POA: Insufficient documentation

## 2019-01-21 DIAGNOSIS — Z905 Acquired absence of kidney: Secondary | ICD-10-CM | POA: Diagnosis not present

## 2019-01-21 DIAGNOSIS — F329 Major depressive disorder, single episode, unspecified: Secondary | ICD-10-CM | POA: Diagnosis not present

## 2019-01-21 DIAGNOSIS — Z923 Personal history of irradiation: Secondary | ICD-10-CM | POA: Diagnosis not present

## 2019-01-21 DIAGNOSIS — Z7951 Long term (current) use of inhaled steroids: Secondary | ICD-10-CM | POA: Diagnosis not present

## 2019-01-21 DIAGNOSIS — K219 Gastro-esophageal reflux disease without esophagitis: Secondary | ICD-10-CM | POA: Diagnosis not present

## 2019-01-21 DIAGNOSIS — Z7982 Long term (current) use of aspirin: Secondary | ICD-10-CM | POA: Diagnosis not present

## 2019-01-21 LAB — BASIC METABOLIC PANEL
Anion gap: 13 (ref 5–15)
BUN: 26 mg/dL — ABNORMAL HIGH (ref 6–20)
CO2: 26 mmol/L (ref 22–32)
Calcium: 9.4 mg/dL (ref 8.9–10.3)
Chloride: 93 mmol/L — ABNORMAL LOW (ref 98–111)
Creatinine, Ser: 1.15 mg/dL (ref 0.61–1.24)
GFR calc Af Amer: >60 mL/min (ref >60)
GFR calc non Af Amer: >60 mL/min (ref >60)
Glucose, Bld: 131 mg/dL — ABNORMAL HIGH (ref 70–99)
Potassium: 4.4 mmol/L (ref 3.5–5.1)
Sodium: 132 mmol/L — ABNORMAL LOW (ref 135–145)

## 2019-01-21 LAB — BRAIN NATRIURETIC PEPTIDE: B Natriuretic Peptide: 3181.8 pg/mL — ABNORMAL HIGH (ref 0.0–100.0)

## 2019-01-21 MED ORDER — CYCLOBENZAPRINE HCL 10 MG PO TABS: 10.0000 mg | ORAL_TABLET | Freq: Three times a day (TID) | ORAL | 0 refills | Status: DC | PRN

## 2019-01-21 NOTE — Addendum Note (Signed)
Encounter addended by: Chari Manning, CMA on: 01/21/2019 10:34 AM  Actions taken: Order list changed, Clinical Note Signed

## 2019-01-21 NOTE — Progress Notes (Signed)
Patient ID: [name], male   DOB: [birth year], 48 y.o.   MRN: [id number]    Primary Cardiologist: Dr Domenic Polite HF MD: Dr Haroldine Laws [name]: Dr Mosetta Pigeon  HPI: [name] is 48 year old with history of NICM (diagnosed 10/2010 - norm cors 2012 and 0/25), chronic systolic heart failure EF 15% (08/2013), S/P ICD [employer], smoker, Wilms tumor s/p nephrectomy at age 16 had chemoradiation, scoliosis.    We saw him at the end of March 2015 and had large recurrent R pleural effusion. Referred to Dr. Prescott Gum who placed Pleurex catheter on 4/10, removed on 09/28/13.   CPX (5/15) with RER 1.06, VO2 max 18.5, VE/VCO2 slope 32.1.  Moderate to severe functional limitation, circulatory.   CPX 09/17/16 Peak VO2: 20.5 (53% predicted peak VO2) Slope 35 Underwent L/RHC on 02/12/17. Had normal coronaries and relatively well-compensated hemodynamics. EF 20%.  Earlier this year we increased Entresto to 32/103.  Felt lightheaded and had stomach discomfort so he cut back.   Has been evaluated at [facility] by Dr Mosetta Pigeon for possible transplant. [name] seen 08/13/18 and felt he was doing well and too early for transplant.   Here for routine f/u. Recently has been having more problems with volume overload. Says he feels OK. A bit more SOB in heat but can still cut grass and do other activities without too much problem. Can go up flight steps slowly. Recently had CPX with pVO2 17.1 (down from 20.5)  Slope 35 (stable). Feels that he could have done better but we used a steeper protocol. No LE edema, orthopnea or PND. Weight stable 140s. Getting cramps in shoulder and upper arm   Echo 6/20 EF 15% RV mild HK with severe TR Personally reviewed  CPX 01/15/19   FVC 2.85 (57%)    FEV1 2.04 (51%)     FEV1/FVC 72 (89%)     MVV 79 (50%)    Resting HR: 96 Peak HR: 157  (90% age predicted max HR)  BP rest: 92/64 BP peak: 100/58  Peak VO2: 17.7 (47% predicted peak VO2)  VE/VCO2 slope: 35  OUES: 1.30  Peak RER: 1.18   Ventilatory Threshold: 14.0 (37% predicted or measured peak VO2 and 79% measured PVO2)  VE/MVV: 58%  O2pulse: 8  (57% predicted O2pulse)    ECHO 04/2014 EF 10% RV mildly dilated.   ECHO 2/18 EF 15% Mild Richard RV mild HK. Severe TR. Triv Richard . RV ok Echo 11/2016  EF 15% RV ok. Moderate to severe TR  Cath 02/12/17:  Normal cors  Ao = 91/60 (74) LV = 102/18 RA =  2 RV = 48/5 PA = 48/21 (31) PCW = 20 Fick cardiac output/index = 4.0/2.3 PVR = 2.8 WU Ao sat = 95% PA sat = 68%, 70% RA/PCWP = 0.10 PaPI = 13.5  CPX 09/17/16 Resting HR: 86 Peak HR: 150  (85% age predicted max HR) BP rest: 96/80 BP peak: 118/72 Peak VO2: 20.5 (53% predicted peak VO2) Ve/VCO2 slope: 35 OUES: 1.38 Peak RER: 1.04 Ventilatory Threshold: 15.7 (41% predicted or measured peak VO2) Peak RR 32 Peak Ventilation: 47.5 VE/MVV: 50% PETCO2 at peak: 30 O2pulse: 8  (62% predicted O2pulse)  CPX 5/15 Resting HR: 89 Peak HR: 138  (77% age predicted max HR) BP rest: 106/68 BP peak: 126/74 (IPE) Peak VO2: 18.5 (46.5% predicted peak VO2) VE/VCO2 slope: 32.1 OUES: 1.33 Peak RER: 1.06 Ventilatory Threshold: 13.5 (33.9% predicted peak VO2) Peak RR 31 Peak Ventilation: 37.1 VE/MVV: 62.7% PETCO2 at  peak: 33 O2pulse: 7  (58% predicted O2pulse)  SH: Lives with his son 46 year old . Unemployed. Disability approved 3 years ago. Does not drink alcohol. Dips tobacco.   FH: Grandfather MI  ROS: All systems negative except as listed in HPI, PMH and Problem List.  Past Medical History:  Diagnosis Date  . AICD (automatic cardioverter/defibrillator) present   . Arthritis   . Cardiomyopathy, nonischemic (HCC)    takes Digoxin daily and Carvedilol daily  . CHF (congestive heart failure) (HCC)    takes Lasix daily as well Aldactone  . Chronic back pain   . Chronic systolic heart failure ([place])   . Depression    takes Prozac daily  . GERD (gastroesophageal reflux disease)    takes Omeprazole daily  . Hx  of Alcohol consumption heavy    rare beer currently  . hx of Tobacco abuse    quit smoking 2014  . Hyperlipidemia    was on Simvastatin but has been off a year  . Insomnia    takes Ambien as needed(just got script yesterday)  . Orthostatic hypotension   . Scoliosis   . 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 48 (XRT and chemo)    Current Outpatient Medications  Medication Sig Dispense Refill  . aspirin 81 MG tablet Take 81 mg by mouth daily.      . Blood Glucose Monitoring Suppl (BLOOD GLUCOSE SYSTEM PAK) KIT Please dispense based on patient and insurance preference. Use to monitor FSBS 2-3x weekly. Dx: E11.9. 1 each 1  . carvedilol (COREG) 3.125 MG tablet TAKE 1 TABLET BY MOUTH TWICE DAILY WITH A MEAL 180 tablet 0  . cetirizine (ZYRTEC) 10 MG tablet Take 1 tablet (10 mg total) by mouth daily. 30 tablet 11  . cyclobenzaprine (FLEXERIL) 10 MG tablet Take 1 tablet (10 mg total) by mouth 3 (three) times daily as needed for muscle spasms. 12 tablet 0  . ENTRESTO 49-51 MG Take 1 tablet by mouth twice daily 180 tablet 3  . fluticasone (FLONASE) 50 MCG/ACT nasal spray Place 2 sprays into both nostrils daily. 16 g 6  . Glucose Blood (BLOOD GLUCOSE TEST STRIPS) STRP Please dispense based on patient and insurance preference. Use to monitor FSBS 2-3x weekly. Dx: E11.9. 50 each 1  . Lancet Devices MISC Please dispense based on patient and insurance preference. Use to monitor FSBS 2-3x weekly. Dx: E11.9. 1 each 1  . Lancets MISC Please dispense based on patient and insurance preference. Use to monitor FSBS 2-3x weekly. Dx: E11.9. 50 each 1  . metolazone (ZAROXOLYN) 2.5 MG tablet Take 1 tablet (2.5 mg total) by mouth as directed. By the heart failure clinic 15 tablet 0  . Multiple Vitamin (MULTIVITAMIN WITH MINERALS) TABS tablet Take 1 tablet by mouth daily.    Marland Kitchen oxyCODONE (OXY IR/ROXICODONE) 5 MG immediate release tablet Take 1 tablet (5 mg total) by mouth 3 (three) times daily as needed for severe pain. 60  tablet 0  . potassium chloride SA (K-DUR) 20 MEQ tablet Take 1 tablet (20 mEq total) by mouth as directed. Take1 tab with Metalazone 90 tablet 3  . sodium chloride (OCEAN) 0.65 % SOLN nasal spray Place 1 spray into both nostrils as needed for congestion.    Marland Kitchen spironolactone (ALDACTONE) 25 MG tablet Take 1 tablet (25 mg total) by mouth daily. 90 tablet 3  . torsemide (DEMADEX) 20 MG tablet Take 2 tablets (40 mg total) by mouth daily. 180 tablet 3  No current facility-administered medications for this encounter.      PHYSICAL EXAM: Vitals:   01/21/19 0950  BP: 108/78  Pulse: 98  SpO2: 97%  Weight: 67.2 kg (148 lb 3.2 oz)   Filed Weights   01/21/19 0950  Weight: 67.2 kg (148 lb 3.2 oz)   General:  Well appearing. No resp difficulty HEENT: normal Neck: supple. JVP 7-9 +prominent CV waves. Carotids 2+ bilat; no bruits. No lymphadenopathy or thryomegaly appreciated. Cor: PMI nondisplaced. Regular rate & rhythm. 2/6 TR Lungs: clear Abdomen: soft, nontender, + distended. No hepatosplenomegaly. No bruits or masses. Good bowel sounds. Extremities: no cyanosis, clubbing, rash, edema Neuro: alert & orientedx3, cranial nerves grossly intact. moves all 4 extremities w/o difficulty. Affect pleasant    ASSESSMENT & PLAN: 1. Chronic Systolic HF:  Nonischemic cardiomyopathy, ECHO 03/2014 EF 10%. Echo 2/18 EF 15% RV mild HK  [employer] ICD.   - Echo 6/20 EF 15% RV normal severe TR - CPX test 8/20 with significant decline form previous. Still borderline but I think it is worth bumping his f/u appt with [facility] team in February up. I d/w Dr. Mosetta Pigeon at [facility] who agrees. Will consider repeat RHC first  - Cath 9/18: normal cors. Relatively well-compensated hemodynmaics - Symptomatically stable NYHA II - Volume status improved with increased torsemide to 40 daily but still with ab distension. Will get ab u/s to look for ascites and cirrhotic features - Continue entresto to 49/51. Unable  to tolerate 97/103 - Continue carvedilol 3.125 bid - Unable to tolerate digoxin due to heartburn - blood type A-pos  2. Severe TR -- Echo with moderate to severe TR but TV seems structurally intact. ? If related to ICD wire or just dilated annulus.   3. Muscle spasms - will give flexeril to use prn #10  4. Abdominal distension - check u/s  Total time spent 35 minutes. Over half that time spent discussing above.    Glori Bickers, MD  10:07 AM

## 2019-01-21 NOTE — H&P (View-Only) (Signed)
Patient ID: [name], male   DOB: 1970/11/28, 48 y.o.   MRN: [id number]    Primary Cardiologist: Dr Domenic Polite HF MD: Dr Haroldine Laws [name]: Dr Mosetta Pigeon  HPI: [name] is 48 year old with history of NICM (diagnosed 10/2010 - norm cors 2012 and 4/62), chronic systolic heart failure EF 15% (08/2013), S/P ICD [employer], smoker, Wilms tumor s/p nephrectomy at age 48 had chemoradiation, scoliosis.    We saw him at the end of March 2015 and had large recurrent R pleural effusion. Referred to Dr. Prescott Gum who placed Pleurex catheter on 4/10, removed on 09/28/13.   CPX (5/15) with RER 1.06, VO2 max 18.5, VE/VCO2 slope 32.1.  Moderate to severe functional limitation, circulatory.   CPX 09/17/16 Peak VO2: 20.5 (53% predicted peak VO2) Slope 35 Underwent L/RHC on 02/12/17. Had normal coronaries and relatively well-compensated hemodynamics. EF 20%.  Earlier this year we increased Entresto to 55/103.  Felt lightheaded and had stomach discomfort so he cut back.   Has been evaluated at [facility] by Dr Mosetta Pigeon for possible transplant. [name] seen 08/13/18 and felt he was doing well and too early for transplant.   Here for routine f/u. Recently has been having more problems with volume overload. Says he feels OK. A bit more SOB in heat but can still cut grass and do other activities without too much problem. Can go up flight steps slowly. Recently had CPX with pVO2 17.1 (down from 20.5)  Slope 35 (stable). Feels that he could have done better but we used a steeper protocol. No LE edema, orthopnea or PND. Weight stable 140s. Getting cramps in shoulder and upper arm   Echo 6/20 EF 15% RV mild HK with severe TR Personally reviewed  CPX 01/15/19   FVC 2.85 (57%)    FEV1 2.04 (51%)     FEV1/FVC 72 (89%)     MVV 79 (50%)    Resting HR: 96 Peak HR: 157  (90% age predicted max HR)  BP rest: 92/64 BP peak: 100/58  Peak VO2: 17.7 (47% predicted peak VO2)  VE/VCO2 slope: 35  OUES: 1.30  Peak RER: 1.18   Ventilatory Threshold: 14.0 (37% predicted or measured peak VO2 and 79% measured PVO2)  VE/MVV: 58%  O2pulse: 8  (57% predicted O2pulse)    ECHO 04/2014 EF 10% RV mildly dilated.   ECHO 2/18 EF 15% Mild Richard RV mild HK. Severe TR. Triv Richard . RV ok Echo 11/2016  EF 15% RV ok. Moderate to severe TR  Cath 02/12/17:  Normal cors  Ao = 91/60 (74) LV = 102/18 RA =  2 RV = 48/5 PA = 48/21 (31) PCW = 20 Fick cardiac output/index = 4.0/2.3 PVR = 2.8 WU Ao sat = 95% PA sat = 68%, 70% RA/PCWP = 0.10 PaPI = 13.5  CPX 09/17/16 Resting HR: 86 Peak HR: 150  (85% age predicted max HR) BP rest: 96/80 BP peak: 118/72 Peak VO2: 20.5 (53% predicted peak VO2) Ve/VCO2 slope: 35 OUES: 1.38 Peak RER: 1.04 Ventilatory Threshold: 15.7 (41% predicted or measured peak VO2) Peak RR 32 Peak Ventilation: 47.5 VE/MVV: 50% PETCO2 at peak: 30 O2pulse: 8  (62% predicted O2pulse)  CPX 5/15 Resting HR: 89 Peak HR: 138  (77% age predicted max HR) BP rest: 106/68 BP peak: 126/74 (IPE) Peak VO2: 18.5 (46.5% predicted peak VO2) VE/VCO2 slope: 32.1 OUES: 1.33 Peak RER: 1.06 Ventilatory Threshold: 13.5 (33.9% predicted peak VO2) Peak RR 31 Peak Ventilation: 37.1 VE/MVV: 62.7% PETCO2 at  peak: 33 O2pulse: 7  (58% predicted O2pulse)  SH: Lives with his son 46 year old . Unemployed. Disability approved 3 years ago. Does not drink alcohol. Dips tobacco.   FH: Grandfather MI  ROS: All systems negative except as listed in HPI, PMH and Problem List.  Past Medical History:  Diagnosis Date  . AICD (automatic cardioverter/defibrillator) present   . Arthritis   . Cardiomyopathy, nonischemic (HCC)    takes Digoxin daily and Carvedilol daily  . CHF (congestive heart failure) (HCC)    takes Lasix daily as well Aldactone  . Chronic back pain   . Chronic systolic heart failure ([place])   . Depression    takes Prozac daily  . GERD (gastroesophageal reflux disease)    takes Omeprazole daily  . Hx  of Alcohol consumption heavy    rare beer currently  . hx of Tobacco abuse    quit smoking 2014  . Hyperlipidemia    was on Simvastatin but has been off a year  . Insomnia    takes Ambien as needed(just got script yesterday)  . Orthostatic hypotension   . Scoliosis   . 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 15 (XRT and chemo)    Current Outpatient Medications  Medication Sig Dispense Refill  . aspirin 81 MG tablet Take 81 mg by mouth daily.      . Blood Glucose Monitoring Suppl (BLOOD GLUCOSE SYSTEM PAK) KIT Please dispense based on patient and insurance preference. Use to monitor FSBS 2-3x weekly. Dx: E11.9. 1 each 1  . carvedilol (COREG) 3.125 MG tablet TAKE 1 TABLET BY MOUTH TWICE DAILY WITH A MEAL 180 tablet 0  . cetirizine (ZYRTEC) 10 MG tablet Take 1 tablet (10 mg total) by mouth daily. 30 tablet 11  . cyclobenzaprine (FLEXERIL) 10 MG tablet Take 1 tablet (10 mg total) by mouth 3 (three) times daily as needed for muscle spasms. 12 tablet 0  . ENTRESTO 49-51 MG Take 1 tablet by mouth twice daily 180 tablet 3  . fluticasone (FLONASE) 50 MCG/ACT nasal spray Place 2 sprays into both nostrils daily. 16 g 6  . Glucose Blood (BLOOD GLUCOSE TEST STRIPS) STRP Please dispense based on patient and insurance preference. Use to monitor FSBS 2-3x weekly. Dx: E11.9. 50 each 1  . Lancet Devices MISC Please dispense based on patient and insurance preference. Use to monitor FSBS 2-3x weekly. Dx: E11.9. 1 each 1  . Lancets MISC Please dispense based on patient and insurance preference. Use to monitor FSBS 2-3x weekly. Dx: E11.9. 50 each 1  . metolazone (ZAROXOLYN) 2.5 MG tablet Take 1 tablet (2.5 mg total) by mouth as directed. By the heart failure clinic 15 tablet 0  . Multiple Vitamin (MULTIVITAMIN WITH MINERALS) TABS tablet Take 1 tablet by mouth daily.    Marland Kitchen oxyCODONE (OXY IR/ROXICODONE) 5 MG immediate release tablet Take 1 tablet (5 mg total) by mouth 3 (three) times daily as needed for severe pain. 60  tablet 0  . potassium chloride SA (K-DUR) 20 MEQ tablet Take 1 tablet (20 mEq total) by mouth as directed. Take1 tab with Metalazone 90 tablet 3  . sodium chloride (OCEAN) 0.65 % SOLN nasal spray Place 1 spray into both nostrils as needed for congestion.    Marland Kitchen spironolactone (ALDACTONE) 25 MG tablet Take 1 tablet (25 mg total) by mouth daily. 90 tablet 3  . torsemide (DEMADEX) 20 MG tablet Take 2 tablets (40 mg total) by mouth daily. 180 tablet 3  No current facility-administered medications for this encounter.      PHYSICAL EXAM: Vitals:   01/21/19 0950  BP: 108/78  Pulse: 98  SpO2: 97%  Weight: 67.2 kg (148 lb 3.2 oz)   Filed Weights   01/21/19 0950  Weight: 67.2 kg (148 lb 3.2 oz)   General:  Well appearing. No resp difficulty HEENT: normal Neck: supple. JVP 7-9 +prominent CV waves. Carotids 2+ bilat; no bruits. No lymphadenopathy or thryomegaly appreciated. Cor: PMI nondisplaced. Regular rate & rhythm. 2/6 TR Lungs: clear Abdomen: soft, nontender, + distended. No hepatosplenomegaly. No bruits or masses. Good bowel sounds. Extremities: no cyanosis, clubbing, rash, edema Neuro: alert & orientedx3, cranial nerves grossly intact. moves all 4 extremities w/o difficulty. Affect pleasant    ASSESSMENT & PLAN: 1. Chronic Systolic HF:  Nonischemic cardiomyopathy, ECHO 03/2014 EF 10%. Echo 2/18 EF 15% RV mild HK  [employer] ICD.   - Echo 6/20 EF 15% RV normal severe TR - CPX test 8/20 with significant decline form previous. Still borderline but I think it is worth bumping his f/u appt with [facility] team in February up. I d/w Dr. Mosetta Pigeon at [facility] who agrees. Will consider repeat RHC first  - Cath 9/18: normal cors. Relatively well-compensated hemodynmaics - Symptomatically stable NYHA II - Volume status improved with increased torsemide to 40 daily but still with ab distension. Will get ab u/s to look for ascites and cirrhotic features - Continue entresto to 49/51. Unable  to tolerate 97/103 - Continue carvedilol 3.125 bid - Unable to tolerate digoxin due to heartburn - blood type A-pos  2. Severe TR -- Echo with moderate to severe TR but TV seems structurally intact. ? If related to ICD wire or just dilated annulus.   3. Muscle spasms - will give flexeril to use prn #10  4. Abdominal distension - check u/s  Total time spent 35 minutes. Over half that time spent discussing above.    Glori Bickers, MD  10:07 AM

## 2019-01-21 NOTE — Patient Instructions (Addendum)
Labs done today. We will call for any abnormal test results.   New Prescription for Flexeril 10 mg has been sent to your pharmacy.  Your physician requested for you to have an Abdominal Ultrasound. You will receive a call to set this appointment up.  Follow up in 2 Months.  At the [facility], you and your health needs are our priority. As part of our continuing mission to provide you with exceptional heart care, we have created designated Provider Care Teams. These Care Teams include your primary Cardiologist (physician) and Advanced Practice Providers (APPs- Physician Assistants and Nurse Practitioners) who all work together to provide you with the care you need, when you need it.   You may see any of the following providers on your designated Care Team at your next follow up: Marland Kitchen Dr Glori Bickers . Dr Loralie Champagne . Darrick Grinder, NP   Please be sure to bring in all your medications bottles to every appointment.

## 2019-01-23 ENCOUNTER — Other Ambulatory Visit (HOSPITAL_COMMUNITY): Payer: Self-pay

## 2019-01-23 DIAGNOSIS — I5022 Chronic systolic (congestive) heart failure: Secondary | ICD-10-CM

## 2019-01-23 NOTE — Progress Notes (Signed)
Spoke with pt and reviewed RHC & covid testing instructions. Pt verbalized understanding with no further questions. Will also mail instructions.

## 2019-01-26 ENCOUNTER — Other Ambulatory Visit (HOSPITAL_COMMUNITY)
Admission: RE | Admit: 2019-01-26 | Discharge: 2019-01-26 | Disposition: A | Discharge status: Home / Self Care | Payer: Medicare HMO | Source: Ambulatory Visit | Attending: Internal Medicine | Admitting: Internal Medicine

## 2019-01-26 ENCOUNTER — Other Ambulatory Visit: Payer: Self-pay

## 2019-01-26 DIAGNOSIS — Z20828 Contact with and (suspected) exposure to other viral communicable diseases: Secondary | ICD-10-CM | POA: Insufficient documentation

## 2019-01-26 LAB — SARS CORONAVIRUS 2 (TAT 6-24 HRS): SARS Coronavirus 2: NEGATIVE

## 2019-01-28 ENCOUNTER — Other Ambulatory Visit: Payer: Self-pay | Admitting: *Deleted

## 2019-01-28 MED ORDER — OXYCODONE HCL 5 MG PO TABS: 5.0000 mg | ORAL_TABLET | Freq: Three times a day (TID) | ORAL | 0 refills | Status: DC | PRN

## 2019-01-28 NOTE — Telephone Encounter (Signed)
Received call from patient.   Requested refill on Oxycodone.   Ok to refill??  Last office visit 6/17/220.  Last refill 12/26/2018.

## 2019-01-29 ENCOUNTER — Ambulatory Visit (HOSPITAL_COMMUNITY)
Admission: RE | Admit: 2019-01-29 | Discharge: 2019-01-29 | Disposition: A | Discharge status: Home / Self Care | Payer: Medicare HMO | Attending: Internal Medicine | Admitting: Internal Medicine

## 2019-01-29 ENCOUNTER — Encounter (HOSPITAL_COMMUNITY)
Admission: RE | Disposition: A | Discharge status: Home / Self Care | Payer: Self-pay | Source: Home / Self Care | Attending: Internal Medicine

## 2019-01-29 ENCOUNTER — Other Ambulatory Visit: Payer: Self-pay

## 2019-01-29 DIAGNOSIS — G8929 Other chronic pain: Secondary | ICD-10-CM | POA: Diagnosis not present

## 2019-01-29 DIAGNOSIS — Z95810 Presence of automatic (implantable) cardiac defibrillator: Secondary | ICD-10-CM | POA: Diagnosis not present

## 2019-01-29 DIAGNOSIS — F329 Major depressive disorder, single episode, unspecified: Secondary | ICD-10-CM | POA: Insufficient documentation

## 2019-01-29 DIAGNOSIS — M62838 Other muscle spasm: Secondary | ICD-10-CM | POA: Insufficient documentation

## 2019-01-29 DIAGNOSIS — R140 Abdominal distension (gaseous): Secondary | ICD-10-CM | POA: Diagnosis not present

## 2019-01-29 DIAGNOSIS — Z79899 Other long term (current) drug therapy: Secondary | ICD-10-CM | POA: Diagnosis not present

## 2019-01-29 DIAGNOSIS — K219 Gastro-esophageal reflux disease without esophagitis: Secondary | ICD-10-CM | POA: Insufficient documentation

## 2019-01-29 DIAGNOSIS — I5022 Chronic systolic (congestive) heart failure: Secondary | ICD-10-CM

## 2019-01-29 DIAGNOSIS — Z905 Acquired absence of kidney: Secondary | ICD-10-CM | POA: Diagnosis not present

## 2019-01-29 DIAGNOSIS — M419 Scoliosis, unspecified: Secondary | ICD-10-CM | POA: Diagnosis not present

## 2019-01-29 DIAGNOSIS — Z85528 Personal history of other malignant neoplasm of kidney: Secondary | ICD-10-CM | POA: Insufficient documentation

## 2019-01-29 DIAGNOSIS — E785 Hyperlipidemia, unspecified: Secondary | ICD-10-CM | POA: Insufficient documentation

## 2019-01-29 DIAGNOSIS — Z7982 Long term (current) use of aspirin: Secondary | ICD-10-CM | POA: Insufficient documentation

## 2019-01-29 DIAGNOSIS — I428 Other cardiomyopathies: Secondary | ICD-10-CM | POA: Diagnosis not present

## 2019-01-29 DIAGNOSIS — G4700 Insomnia, unspecified: Secondary | ICD-10-CM | POA: Diagnosis not present

## 2019-01-29 DIAGNOSIS — R69 Illness, unspecified: Secondary | ICD-10-CM | POA: Diagnosis not present

## 2019-01-29 DIAGNOSIS — Z87891 Personal history of nicotine dependence: Secondary | ICD-10-CM | POA: Diagnosis not present

## 2019-01-29 HISTORY — PX: RIGHT HEART CATH: CATH118263

## 2019-01-29 LAB — POCT I-STAT EG7
Acid-base deficit: 1 mmol/L (ref 0.0–2.0)
Bicarbonate: 23.6 mmol/L (ref 20.0–28.0)
Bicarbonate: 25 mmol/L (ref 20.0–28.0)
Calcium, Ion: 1.12 mmol/L — ABNORMAL LOW (ref 1.15–1.40)
Calcium, Ion: 1.22 mmol/L (ref 1.15–1.40)
HCT: 36 % — ABNORMAL LOW (ref 39.0–52.0)
HCT: 37 % — ABNORMAL LOW (ref 39.0–52.0)
Hemoglobin: 12.2 g/dL — ABNORMAL LOW (ref 13.0–17.0)
Hemoglobin: 12.6 g/dL — ABNORMAL LOW (ref 13.0–17.0)
O2 Saturation: 62 %
O2 Saturation: 62 %
Potassium: 4.6 mmol/L (ref 3.5–5.1)
Potassium: 4.8 mmol/L (ref 3.5–5.1)
Sodium: 128 mmol/L — ABNORMAL LOW (ref 135–145)
Sodium: 131 mmol/L — ABNORMAL LOW (ref 135–145)
TCO2: 25 mmol/L (ref 22–32)
TCO2: 26 mmol/L (ref 22–32)
pCO2, Ven: 38.5 mmHg — ABNORMAL LOW (ref 44.0–60.0)
pCO2, Ven: 40.3 mmHg — ABNORMAL LOW (ref 44.0–60.0)
pH, Ven: 7.396 (ref 7.250–7.430)
pH, Ven: 7.4 (ref 7.250–7.430)
pO2, Ven: 32 mmHg (ref 32.0–45.0)
pO2, Ven: 32 mmHg (ref 32.0–45.0)

## 2019-01-29 LAB — BASIC METABOLIC PANEL
Anion gap: 11 (ref 5–15)
BUN: 21 mg/dL — ABNORMAL HIGH (ref 6–20)
CO2: 25 mmol/L (ref 22–32)
Calcium: 9.3 mg/dL (ref 8.9–10.3)
Chloride: 90 mmol/L — ABNORMAL LOW (ref 98–111)
Creatinine, Ser: 1.08 mg/dL (ref 0.61–1.24)
GFR calc Af Amer: >60 mL/min (ref >60)
GFR calc non Af Amer: >60 mL/min (ref >60)
Glucose, Bld: 141 mg/dL — ABNORMAL HIGH (ref 70–99)
Potassium: 4.7 mmol/L (ref 3.5–5.1)
Sodium: 126 mmol/L — ABNORMAL LOW (ref 135–145)

## 2019-01-29 LAB — CBC
HCT: 35.8 % — ABNORMAL LOW (ref 39.0–52.0)
Hemoglobin: 11.9 g/dL — ABNORMAL LOW (ref 13.0–17.0)
MCH: 30.9 pg (ref 26.0–34.0)
MCHC: 33.2 g/dL (ref 30.0–36.0)
MCV: 93 fL (ref 80.0–100.0)
Platelets: 302 10*3/uL (ref 150–400)
RBC: 3.85 MIL/uL — ABNORMAL LOW (ref 4.22–5.81)
RDW: 14.5 % (ref 11.5–15.5)
WBC: 4.5 10*3/uL (ref 4.0–10.5)
nRBC: 0 % (ref 0.0–0.2)

## 2019-01-29 SURGERY — RIGHT HEART CATH
Anesthesia: LOCAL

## 2019-01-29 MED ORDER — HEPARIN (PORCINE) IN NACL 1000-0.9 UT/500ML-% IV SOLN
INTRAVENOUS | Status: AC
  Filled 2019-01-29: qty 500

## 2019-01-29 MED ORDER — LIDOCAINE HCL (PF) 1 % IJ SOLN
INTRAMUSCULAR | Status: AC
  Filled 2019-01-29: qty 30

## 2019-01-29 MED ORDER — LIDOCAINE HCL (PF) 1 % IJ SOLN
INTRAMUSCULAR | Status: DC | PRN
  Administered 2019-01-29: 2 mL

## 2019-01-29 MED ORDER — LABETALOL HCL 5 MG/ML IV SOLN: 10.0000 mg | INTRAVENOUS | Status: DC | PRN

## 2019-01-29 MED ORDER — SODIUM CHLORIDE 0.9% FLUSH: 3.0000 mL | INTRAVENOUS | Status: DC | PRN

## 2019-01-29 MED ORDER — ASPIRIN 81 MG PO CHEW: 81.0000 mg | CHEWABLE_TABLET | ORAL | Status: DC

## 2019-01-29 MED ORDER — ONDANSETRON HCL 4 MG/2ML IJ SOLN: 4.0000 mg | Freq: Four times a day (QID) | INTRAMUSCULAR | Status: DC | PRN

## 2019-01-29 MED ORDER — SODIUM CHLORIDE 0.9 % IV SOLN: 250.0000 mL | INTRAVENOUS | Status: DC | PRN

## 2019-01-29 MED ORDER — HEPARIN (PORCINE) IN NACL 1000-0.9 UT/500ML-% IV SOLN
INTRAVENOUS | Status: DC | PRN
  Administered 2019-01-29: 500 mL

## 2019-01-29 MED ORDER — HYDRALAZINE HCL 20 MG/ML IJ SOLN: 10.0000 mg | INTRAMUSCULAR | Status: DC | PRN

## 2019-01-29 MED ORDER — SODIUM CHLORIDE 0.9% FLUSH: 3.0000 mL | Freq: Two times a day (BID) | INTRAVENOUS | Status: DC

## 2019-01-29 MED ORDER — SODIUM CHLORIDE 0.9 % IV SOLN: INTRAVENOUS | Status: DC

## 2019-01-29 MED ORDER — ACETAMINOPHEN 325 MG PO TABS: 650.0000 mg | ORAL_TABLET | ORAL | Status: DC | PRN

## 2019-01-29 SURGICAL SUPPLY — 7 items
CATH BALLN WEDGE 5F 110CM (CATHETERS) ×1 IMPLANT
GUIDEWIRE .025 260CM (WIRE) ×1 IMPLANT
PACK CARDIAC CATHETERIZATION (CUSTOM PROCEDURE TRAY) ×2 IMPLANT
SHEATH GLIDE SLENDER 4/5FR (SHEATH) ×1 IMPLANT
TRANSDUCER W/STOPCOCK (MISCELLANEOUS) ×2 IMPLANT
TUBING ART PRESS 72  MALE/FEM (TUBING) ×1
TUBING ART PRESS 72 MALE/FEM (TUBING) IMPLANT

## 2019-01-29 NOTE — Interval H&P Note (Signed)
History and Physical Interval Note:  01/29/2019 2:18 PM  [name]  has presented today for surgery, with the diagnosis of heart failure.  The various methods of treatment have been discussed with the patient and family. After consideration of risks, benefits and other options for treatment, the patient has consented to  Procedure(s): RIGHT HEART CATH (N/A) as a surgical intervention.  The patient's history has been reviewed, patient examined, no change in status, stable for surgery.  I have reviewed the patient's chart and labs.  Questions were answered to the patient's satisfaction.     Chemika Nightengale

## 2019-01-29 NOTE — Discharge Instructions (Signed)
Venogram, Care After °This sheet gives you information about how to care for yourself after your procedure. Your health care provider may also give you more specific instructions. If you have problems or questions, contact your health care provider. °What can I expect after the procedure? °After the procedure, it is common to have: °· Bruising or mild discomfort in the area where the IV was inserted (insertion site). °Follow these instructions at home: °Eating and drinking ° °· Follow instructions from your health care provider about eating or drinking restrictions. °· Drink a lot of fluids for the first several days after the procedure, as directed by your health care provider. This helps to wash (flush) the contrast out of your body. Examples of healthy fluids include water or low-calorie drinks. °General instructions °· Check your IV insertion area every day for signs of infection. Check for: °? Redness, swelling, or pain. °? Fluid or blood. °? Warmth. °? Pus or a bad smell. °· Take over-the-counter and prescription medicines only as told by your health care provider. °· Rest and return to your normal activities as told by your health care provider. Ask your health care provider what activities are safe for you. °· Do not drive for 24 hours if you were given a medicine to help you relax (sedative), or until your health care provider approves. °· Keep all follow-up visits as told by your health care provider. This is important. °Contact a health care provider if: °· Your skin becomes itchy or you develop a rash or hives. °· You have a fever that does not get better with medicine. °· You feel nauseous. °· You vomit. °· You have redness, swelling, or pain around the insertion site. °· You have fluid or blood coming from the insertion site. °· Your insertion area feels warm to the touch. °· You have pus or a bad smell coming from the insertion site. °Get help right away if: °· You have difficulty breathing or  shortness of breath. °· You develop chest pain. °· You faint. °· You feel very dizzy. °These symptoms may represent a serious problem that is an emergency. Do not wait to see if the symptoms will go away. Get medical help right away. Call your local emergency services (911 in the [place] Do not drive yourself to the hospital. °Summary °· After your procedure, it is common to have bruising or mild discomfort in the area where the IV was inserted. °· You should check your IV insertion area every day for signs of infection. °· Take over-the-counter and prescription medicines only as told by your health care provider. °· You should drink a lot of fluids for the first several days after the procedure to help flush the contrast from your body. °This information is not intended to replace advice given to you by your health care provider. Make sure you discuss any questions you have with your health care provider. °Document Released: 03/18/2013 Document Revised: 05/10/2017 Document Reviewed: 04/21/2016 °Elsevier Patient Education © 2020 [employer] Inc[employer] ° °

## 2019-01-30 ENCOUNTER — Encounter (HOSPITAL_COMMUNITY): Payer: Self-pay | Admitting: Internal Medicine

## 2019-02-03 ENCOUNTER — Ambulatory Visit (HOSPITAL_COMMUNITY)
Admission: RE | Admit: 2019-02-03 | Discharge: 2019-02-03 | Disposition: A | Discharge status: Home / Self Care | Payer: Medicare HMO | Source: Ambulatory Visit | Attending: Internal Medicine | Admitting: Internal Medicine

## 2019-02-03 ENCOUNTER — Other Ambulatory Visit: Payer: Self-pay

## 2019-02-03 DIAGNOSIS — R188 Other ascites: Secondary | ICD-10-CM | POA: Insufficient documentation

## 2019-02-05 DIAGNOSIS — R188 Other ascites: Secondary | ICD-10-CM | POA: Diagnosis not present

## 2019-02-05 DIAGNOSIS — I361 Nonrheumatic tricuspid (valve) insufficiency: Secondary | ICD-10-CM | POA: Diagnosis not present

## 2019-02-05 DIAGNOSIS — I5022 Chronic systolic (congestive) heart failure: Secondary | ICD-10-CM | POA: Diagnosis not present

## 2019-02-05 DIAGNOSIS — R932 Abnormal findings on diagnostic imaging of liver and biliary tract: Secondary | ICD-10-CM | POA: Diagnosis not present

## 2019-02-10 ENCOUNTER — Telehealth (HOSPITAL_COMMUNITY): Payer: Self-pay | Admitting: *Deleted

## 2019-02-10 ENCOUNTER — Encounter: Payer: Self-pay | Admitting: Physician Assistant

## 2019-02-10 DIAGNOSIS — R188 Other ascites: Secondary | ICD-10-CM

## 2019-02-10 DIAGNOSIS — K7469 Other cirrhosis of liver: Secondary | ICD-10-CM

## 2019-02-10 NOTE — Telephone Encounter (Signed)
Notes recorded by Scarlette Calico, RN on 02/10/2019 at 1:42 PM EDT  Pt aware and agreeable to referral, ref placed

## 2019-02-10 NOTE — Telephone Encounter (Signed)
-----   Message from Jolaine Artist, MD sent at 02/08/2019  3:22 PM EDT ----- Needs referral to a liver doctor at [place] as part of transplant w/u

## 2019-02-19 ENCOUNTER — Other Ambulatory Visit: Payer: Self-pay

## 2019-02-24 ENCOUNTER — Other Ambulatory Visit: Payer: Self-pay

## 2019-02-24 ENCOUNTER — Encounter: Payer: Self-pay | Admitting: Physician Assistant

## 2019-02-24 ENCOUNTER — Other Ambulatory Visit (INDEPENDENT_AMBULATORY_CARE_PROVIDER_SITE_OTHER): Payer: Medicare HMO

## 2019-02-24 ENCOUNTER — Ambulatory Visit (INDEPENDENT_AMBULATORY_CARE_PROVIDER_SITE_OTHER): Payer: Medicare HMO | Admitting: Physician Assistant

## 2019-02-24 VITALS — BP 100/70 | HR 97 | Temp 97.9°F | Ht 69.0 in | Wt 161.0 lb

## 2019-02-24 DIAGNOSIS — K7460 Unspecified cirrhosis of liver: Secondary | ICD-10-CM | POA: Diagnosis not present

## 2019-02-24 LAB — COMPREHENSIVE METABOLIC PANEL
ALT: 20 U/L (ref 0–53)
AST: 30 U/L (ref 0–37)
Albumin: 4.2 g/dL (ref 3.5–5.2)
Alkaline Phosphatase: 285 U/L — ABNORMAL HIGH (ref 39–117)
BUN: 21 mg/dL (ref 6–23)
CO2: 33 mEq/L — ABNORMAL HIGH (ref 19–32)
Calcium: 9.8 mg/dL (ref 8.4–10.5)
Chloride: 92 mEq/L — ABNORMAL LOW (ref 96–112)
Creatinine, Ser: 1.09 mg/dL (ref 0.40–1.50)
GFR: 72.88 mL/min (ref >60.00)
Glucose, Bld: 119 mg/dL — ABNORMAL HIGH (ref 70–99)
Potassium: 3.8 mEq/L (ref 3.5–5.1)
Sodium: 135 mEq/L (ref 135–145)
Total Bilirubin: 0.8 mg/dL (ref 0.2–1.2)
Total Protein: 8 g/dL (ref 6.0–8.3)

## 2019-02-24 LAB — PROTIME-INR
INR: 1.3 ratio — ABNORMAL HIGH (ref 0.8–1.0)
Prothrombin Time: 14.9 s — ABNORMAL HIGH (ref 9.6–13.1)

## 2019-02-24 LAB — CBC WITH DIFFERENTIAL/PLATELET
Basophils Absolute: 0 10*3/uL (ref 0.0–0.1)
Basophils Relative: 0.7 % (ref 0.0–3.0)
Eosinophils Absolute: 0.1 10*3/uL (ref 0.0–0.7)
Eosinophils Relative: 1.1 % (ref 0.0–5.0)
HCT: 35.7 % — ABNORMAL LOW (ref 39.0–52.0)
Hemoglobin: 12 g/dL — ABNORMAL LOW (ref 13.0–17.0)
Lymphocytes Relative: 6.3 % — ABNORMAL LOW (ref 12.0–46.0)
Lymphs Abs: 0.3 10*3/uL — ABNORMAL LOW (ref 0.7–4.0)
MCHC: 33.7 g/dL (ref 30.0–36.0)
MCV: 95.1 fl (ref 78.0–100.0)
Monocytes Absolute: 0.6 10*3/uL (ref 0.1–1.0)
Monocytes Relative: 11.9 % (ref 3.0–12.0)
Neutro Abs: 3.8 10*3/uL (ref 1.4–7.7)
Neutrophils Relative %: 80 % — ABNORMAL HIGH (ref 43.0–77.0)
Platelets: 337 10*3/uL (ref 150.0–400.0)
RBC: 3.76 Mil/uL — ABNORMAL LOW (ref 4.22–5.81)
RDW: 16.4 % — ABNORMAL HIGH (ref 11.5–15.5)
WBC: 4.7 10*3/uL (ref 4.0–10.5)

## 2019-02-24 LAB — IBC + FERRITIN
Ferritin: 142.3 ng/mL (ref 22.0–322.0)
Iron: 60 ug/dL (ref 42–165)
Saturation Ratios: 15.7 % — ABNORMAL LOW (ref 20.0–50.0)
Transferrin: 273 mg/dL (ref 212.0–360.0)

## 2019-02-24 NOTE — Progress Notes (Signed)
Agree with assessment as outlined. 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 Sounds like this is probably due to alcohol but needs workup to exclude other etiologies. He has ascites, could be due to CHF or cirrhosis. Would proceed with paracentesis to further evaluate and clarify. He warrants cross sectional imaging of his liver and HCC screening. He is on Coreg and if he stays on this does not need variceal screening although beta blockade can make ascites worse from cirrhosis, although sounds like he needs it for his CHF. He would not be a candidate for a liver transplant with severe CHF, and I suspect he would not be a candidate for heart transplant with known cirrhosis at his age but defer to his transplant evaluation. He needs to completely abstain from alcohol. Further recommendations pending his workup and course.

## 2019-02-24 NOTE — Patient Instructions (Signed)
Your provider has requested that you go to the basement level for lab work before leaving today. Press "B" on the elevator. The lab is located at the first door on the left as you exit the elevator.  You have been scheduled for an MRI at [facility] on 03/04/2019. Your appointment time is 8:00am. Please arrive 15 minutes prior to your appointment time for registration purposes. Please make certain not to have anything to eat or drink 6 hours prior to your test. In addition, if you have any metal in your body, have a pacemaker or defibrillator, please be sure to let your ordering physician know. This test typically takes 45 minutes to 1 hour to complete. Should you need to reschedule, please call [phone] to do so.  You have been scheduled for an abdominal ultrasound at [facility]  Radiology (1st floor of hospital) on 03/02/2019 at 8:30am. Please arrive 15 minutes prior to your appointment for registration. Make certain not to have anything to eat or drink 6 hours prior to your appointment. Should you need to reschedule your appointment, please contact radiology at [phone]. This test typically takes about 30 minutes to perform.  You have been scheduled for an abdominal paracentesis at [facility] radiology  on 03/02/2019 at 10:00am. Please arrive at least 15 minutes prior to your appointment time for registration. Should you need to reschedule this appointment for any reason, please call our office at [phone].  Thank you for choosing me and [facility].  Dennison Bulla

## 2019-02-24 NOTE — Progress Notes (Signed)
Chief Complaint: Cirrhosis with ascites  HPI:    [name] is a 48 year old Caucasian male with a complicated past medical history as listed below including severe nonischemic cardiomyopathy (11/21/2018 echo with LVEF 15%) who was referred to me by [name], Shaune Pascal, MD for a complaint of cirrhosis with ascites.      02/05/2019 office visit at [facility] cardiology for chronic systolic heart failure.  At that time discussed 06/30/2015 ultrasound of the abdomen with echogenic surface with a nodular contour suggesting fatty infiltration/hepatocellular disease and possible cirrhosis.  Portal vein was patent with normal directional flow.  No gallstones or biliary distention.  They are discussing possible heart transplant.  Prior to this they wanted further work-up in regards to liver disease to see if this is contributing to any of his ascites.  His diuretics were increased.  Torsemide 40 mg twice daily.  Continued on Spironolactone 25 mg daily.    Today, the patient presents clinic and explains that he feels fairly well for himself.  Notes that he has been battling with all the symptoms for at least 3 years now.  It all started when he "passed out" when walking back into his house 1 day.  Since then has had trouble with ascites and describes heart work-up as above.  They are now discussing transplant.  Tells me that he does have a history of alcoholism but has not "drank like that for at least 2 years".  Tells me that now he may have an occasional beer or 2 with his neighbor once a week.  Patient tells me he is not as good as he should be about checking his daily weights but does feel as though his abdomen has been getting bigger and bigger.  Tells me he does take all of his medications as prescribed.    Denies fever, chills, abdominal pain, heartburn, reflux or symptoms that awaken him from his sleep.  Past Medical History:  Diagnosis Date  . AICD (automatic cardioverter/defibrillator) present   . Arthritis    . Cardiomyopathy, nonischemic (HCC)    takes Digoxin daily and Carvedilol daily  . CHF (congestive heart failure) (HCC)    takes Lasix daily as well Aldactone  . Chronic back pain   . Chronic systolic heart failure ([place])   . Depression    takes Prozac daily  . GERD (gastroesophageal reflux disease)    takes Omeprazole daily  . Hx of Alcohol consumption heavy    rare beer currently  . hx of Tobacco abuse    quit smoking 2014  . Hyperlipidemia    was on Simvastatin but has been off a year  . Insomnia    takes Ambien as needed(just got script yesterday)  . Orthostatic hypotension   . Scoliosis   . 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 82 (XRT and chemo)    Past Surgical History:  Procedure Laterality Date  . CARPAL TUNNEL RELEASE Right 01/09/2013   Procedure: CARPAL TUNNEL RELEASE;  Surgeon: Winfield Cunas, MD;  Location: [place] [facility];  Service: Neurosurgery;  Laterality: Right;  RIGHT carpal tunnel release  . CARPAL TUNNEL RELEASE Left 01/30/2013   Procedure: LEFT CARPAL TUNNEL RELEASE;  Surgeon: Winfield Cunas, MD;  Location: [place] [facility];  Service: Neurosurgery;  Laterality: Left;  LEFT Carpal Tunnel release  . CHEST TUBE INSERTION Right 09/09/2013   Procedure: INSERTION PLEURAL DRAINAGE CATHETER;  Surgeon: Ivin Poot, MD;  Location: [place];  Service: Thoracic;  Laterality: Right;  .  HYDROCELE EXCISION Right 04/11/2017   Procedure: RIGHT HYDROCELECTOMY ADULT;  Surgeon: Irine Seal, MD;  Location: [facility];  Service: Urology;  Laterality: Right;  . hydrocelectomy  2008  . ICD  05/25/2011   [employer] Endotak [employer] SG lead/Energen single chamber device  . IMPLANTABLE CARDIOVERTER DEFIBRILLATOR IMPLANT N/A 05/25/2011   Procedure: IMPLANTABLE CARDIOVERTER DEFIBRILLATOR IMPLANT;  Surgeon: Evans Lance, MD;  Location: [facility] CATH LAB;  Service: Cardiovascular;  Laterality: N/A;  . NEPHRECTOMY  36 yrs ago   Wilms Tumor  . RIGHT HEART CATH N/A 01/29/2019   Procedure: RIGHT HEART CATH;   Surgeon: Jolaine Artist, MD;  Location: [place] CV LAB;  Service: Cardiovascular;  Laterality: N/A;  . RIGHT/LEFT HEART CATH AND CORONARY ANGIOGRAPHY N/A 02/12/2017   Procedure: RIGHT/LEFT HEART CATH AND CORONARY ANGIOGRAPHY;  Surgeon: Jolaine Artist, MD;  Location: [place] CV LAB;  Service: Cardiovascular;  Laterality: N/A;  . ULNAR NERVE TRANSPOSITION Right 01/09/2013   Procedure: ULNAR NERVE DECOMPRESSION/TRANSPOSITION;  Surgeon: Winfield Cunas, MD;  Location: [place] [facility] ORS;  Service: Neurosurgery;  Laterality: Right;  RIGHT ulnar nerve decompression    Current Outpatient Medications  Medication Sig Dispense Refill  . aspirin 81 MG tablet Take 81 mg by mouth daily.      . Blood Glucose Monitoring Suppl (BLOOD GLUCOSE SYSTEM PAK) KIT Please dispense based on patient and insurance preference. Use to monitor FSBS 2-3x weekly. Dx: E11.9. 1 each 1  . carvedilol (COREG) 3.125 MG tablet TAKE 1 TABLET BY MOUTH TWICE DAILY WITH A MEAL (Patient taking differently: Take 3.125 mg by mouth 2 (two) times daily. ) 180 tablet 0  . cetirizine (ZYRTEC) 10 MG tablet Take 1 tablet (10 mg total) by mouth daily. 30 tablet 11  . cyclobenzaprine (FLEXERIL) 10 MG tablet Take 1 tablet (10 mg total) by mouth 3 (three) times daily as needed for muscle spasms. 10 tablet 0  . ENTRESTO 49-51 MG Take 1 tablet by mouth twice daily (Patient taking differently: Take 1 tablet by mouth 2 (two) times daily. ) 180 tablet 3  . fluticasone (FLONASE) 50 MCG/ACT nasal spray Place 2 sprays into both nostrils daily. 16 g 6  . Glucose Blood (BLOOD GLUCOSE TEST STRIPS) STRP Please dispense based on patient and insurance preference. Use to monitor FSBS 2-3x weekly. Dx: E11.9. 50 each 1  . Lancet Devices MISC Please dispense based on patient and insurance preference. Use to monitor FSBS 2-3x weekly. Dx: E11.9. 1 each 1  . Lancets MISC Please dispense based on patient and insurance preference. Use to monitor FSBS 2-3x weekly. Dx:  E11.9. 50 each 1  . Multiple Vitamin (MULTIVITAMIN WITH MINERALS) TABS tablet Take 1 tablet by mouth daily.    Marland Kitchen oxyCODONE (OXY IR/ROXICODONE) 5 MG immediate release tablet Take 1 tablet (5 mg total) by mouth 3 (three) times daily as needed for severe pain. 60 tablet 0  . potassium chloride SA (K-DUR) 20 MEQ tablet Take 1 tablet (20 mEq total) by mouth as directed. Take1 tab with Metalazone (Patient taking differently: Take 20 mEq by mouth daily as needed (with excessive fluid retention take with metolazone). Take1 tab with Metalazone) 90 tablet 3  . spironolactone (ALDACTONE) 25 MG tablet Take 1 tablet (25 mg total) by mouth daily. 90 tablet 3  . torsemide (DEMADEX) 20 MG tablet Take 2 tablets (40 mg total) by mouth daily. 180 tablet 3  . metolazone (ZAROXOLYN) 2.5 MG tablet Take 1 tablet (2.5 mg total) by mouth as  directed. By the heart failure clinic (Patient taking differently: Take 2.5 mg by mouth daily as needed (for excession fluid retention.). By the heart failure clinic) 15 tablet 0   No current facility-administered medications for this visit.     Allergies as of 02/24/2019 - Review Complete 02/24/2019  Allergen Reaction Noted  . Ibuprofen Hives 11/09/2010  . Nsaids  09/21/2015    Family History  Problem Relation Age of Onset  . Diabetes Mother     Social History   Socioeconomic History  . Marital status: Single    Spouse name: Not on file  . Number of children: 1  . Years of education: Not on file  . Highest education level: Not on file  Occupational History  . Occupation: Full time    Comment: Engineer, civil (consulting)  Social Needs  . Financial resource strain: Not on file  . Food insecurity    Worry: Not on file    Inability: Not on file  . Transportation needs    Medical: Not on file    Non-medical: Not on file  Tobacco Use  . Smoking status: Former Smoker    Packs/day: 0.25    Years: 20.00    Pack years: 5.00    Types: Cigarettes    Quit date: 09/07/2012    Years  since quitting: 6.4  . Smokeless tobacco: Current User    Types: Snuff  Substance and Sexual Activity  . Alcohol use: Yes    Alcohol/week: 0.0 standard drinks    Comment: histoorically a heavy drinker ("beer only")  . Drug use: No  . Sexual activity: Not Currently  Lifestyle  . Physical activity    Days per week: Not on file    Minutes per session: Not on file  . Stress: Not on file  Relationships  . Social Herbalist on phone: Not on file    Gets together: Not on file    Attends religious service: Not on file    Active member of club or organization: Not on file    Attends meetings of clubs or organizations: Not on file    Relationship status: Not on file  . Intimate partner violence    Fear of current or ex partner: Not on file    Emotionally abused: Not on file    Physically abused: Not on file    Forced sexual activity: Not on file  Other Topics Concern  . Not on file  Social History Narrative  . Not on file    Review of Systems:    Constitutional: No weight loss, fever or chills Skin: No rash  Cardiovascular: No chest pain Respiratory:+DOE Gastrointestinal: See HPI and otherwise negative Genitourinary: No dysuria Neurological: No headache, dizziness or syncope Musculoskeletal: No new muscle or joint pain Hematologic: No bleeding  Psychiatric: No history of depression or anxiety   Physical Exam:  Vital signs: BP 100/70   Pulse 97   Temp 97.9 F (36.6 C)   Ht '5\' 9"'$  (1.753 m)   Wt 161 lb (73 kg)   BMI 23.78 kg/m   Constitutional:   Pleasant Caucasian male appears to be in NAD, Well developed, Well nourished, alert and cooperative Head:  Normocephalic and atraumatic. Eyes:   PEERL, EOMI. No icterus. Conjunctiva pink. Ears:  Normal auditory acuity. Neck:  Supple Throat: Oral cavity and pharynx without inflammation, swelling or lesion.  Respiratory: Respirations even and unlabored. Lungs clear to auscultation bilaterally.   No wheezes, crackles,  or rhonchi.  Cardiovascular: Normal S1, S2. No MRG. Regular rate and rhythm. No peripheral edema, cyanosis or pallor.  Gastrointestinal:  +ascites, Soft, nondistended, nontender. No rebound or guarding. Normal bowel sounds. No appreciable masses or hepatomegaly. Rectal:  Not performed.  Msk:  Symmetrical without gross deformities. Without edema, no deformity or joint abnormality.  Neurologic:  Alert and  oriented x4;  grossly normal neurologically.  Skin:   Dry and intact without significant lesions or rashes. Psychiatric: Demonstrates good judgement and reason without abnormal affect or behaviors.  MOST RECENT LABS AND IMAGING: CBC    Component Value Date/Time   WBC 4.5 01/29/2019 1203   RBC 3.85 (L) 01/29/2019 1203   HGB 12.6 (L) 01/29/2019 1501   HCT 37.0 (L) 01/29/2019 1501   PLT 302 01/29/2019 1203   MCV 93.0 01/29/2019 1203   MCH 30.9 01/29/2019 1203   MCHC 33.2 01/29/2019 1203   RDW 14.5 01/29/2019 1203   LYMPHSABS 563 (L) 11/26/2018 1127   MONOABS 780 05/15/2016 1609   EOSABS 162 11/26/2018 1127   BASOSABS 59 11/26/2018 1127    CMP     Component Value Date/Time   NA 128 (L) 01/29/2019 1501   NA 128 (L) 12/05/2018 1035   K 4.8 01/29/2019 1501   CL 90 (L) 01/29/2019 1203   CO2 25 01/29/2019 1203   GLUCOSE 141 (H) 01/29/2019 1203   BUN 21 (H) 01/29/2019 1203   BUN 30 (H) 12/05/2018 1035   CREATININE 1.08 01/29/2019 1203   CREATININE 1.31 11/26/2018 1127   CALCIUM 9.3 01/29/2019 1203   PROT 8.6 (H) 11/26/2018 1127   ALBUMIN 3.5 (L) 05/15/2016 1609   AST 30 11/26/2018 1127   ALT 21 11/26/2018 1127   ALKPHOS 136 (H) 05/15/2016 1609   BILITOT 1.0 11/26/2018 1127   GFRNONAA >60 01/29/2019 1203   GFRNONAA 79 03/05/2018 1528   GFRAA >60 01/29/2019 1203   GFRAA 91 03/05/2018 1528    Assessment: 1.  Cirrhosis of the liver with ascites: Appears to been question even on ultrasound back in 2014 but no work-up, now with heart failure, likely a lot of the fluid is from  this, but must also consider cirrhosis 2. Heart failure: Last EF 15%, discussion of heart transplant by [facility]  Plan: 1.  Ordered MRI of the abdomen with and without contrast as there has been no previous cross-sectional imaging. (patient requested imaging be done at [facility] as he lives in [place]) 2.  Ordered ultrasound of the abdomen with paracentesis and fluid for cell count, cytology, albumin 3.  Ordered liver labs to include hepatitis labs, ANA, ASMA, AMA, alpha-1 antitrypsin, total IgG, PT/INR, iron studies with ferritin, repeat CBC and CMP, ceruloplasmin and AFP 4.  Had discussion with the patient that he should completely abstain from alcohol use.  He verbalized understanding. 5.  It should be noted that patient is likely on Coreg for his heart but this can also increase ascites. 6.  We will defer EGD for variceal screening given heart failure. 7.  Continue Spironolactone 25 mg daily and Torsemide 40 mg twice daily.  Could consider increasing Spironolactone to 50 mg/day pending labs 7.  Please await further recommendations after labs and imaging above.  Patient was assigned to Dr. Havery Moros today.  Case was discussed with Dr. Havery Moros before time of patient's appointment.  Ellouise Newer, PA-C [facility] 02/24/2019, 11:35 AM  Cc: [name], Shaune Pascal, MD

## 2019-02-26 ENCOUNTER — Other Ambulatory Visit: Payer: Self-pay | Admitting: *Deleted

## 2019-02-26 NOTE — Telephone Encounter (Signed)
Received call from patient.   Requested refill on Oxycodone.   Ok to refill??  Last office visit 11/26/2018.  Last refill 01/28/2019.

## 2019-02-27 LAB — HEPATITIS C ANTIBODY
Hepatitis C Ab: NONREACTIVE
SIGNAL TO CUT-OFF: 0.05 (ref <1.00)

## 2019-02-27 LAB — ALPHA-1-ANTITRYPSIN: A-1 Antitrypsin, Ser: 126 mg/dL (ref 83–199)

## 2019-02-27 LAB — ANTI-NUCLEAR AB-TITER (ANA TITER): ANA Titer 1: 1:80 {titer} — ABNORMAL HIGH

## 2019-02-27 LAB — ANA: Anti Nuclear Antibody (ANA): POSITIVE — AB

## 2019-02-27 LAB — HEPATITIS B SURFACE ANTIBODY,QUALITATIVE: Hep B S Ab: NONREACTIVE

## 2019-02-27 LAB — CERULOPLASMIN: Ceruloplasmin: 46 mg/dL — ABNORMAL HIGH (ref 18–36)

## 2019-02-27 LAB — ANTI-SMOOTH MUSCLE ANTIBODY, IGG: Actin (Smooth Muscle) Antibody (IGG): <20 U (ref <20)

## 2019-02-27 LAB — HEPATITIS B SURFACE ANTIGEN: Hepatitis B Surface Ag: NONREACTIVE

## 2019-02-27 LAB — HEPATITIS A ANTIBODY, TOTAL: Hepatitis A AB,Total: NONREACTIVE

## 2019-02-27 LAB — MITOCHONDRIAL ANTIBODIES: Mitochondrial M2 Ab, IgG: <=20 U

## 2019-02-27 LAB — AFP TUMOR MARKER: AFP-Tumor Marker: 7.6 ng/mL — ABNORMAL HIGH (ref <6.1)

## 2019-02-27 LAB — IGG: IgG (Immunoglobin G), Serum: 1543 mg/dL (ref 600–1640)

## 2019-02-27 MED ORDER — OXYCODONE HCL 5 MG PO TABS: 5.0000 mg | ORAL_TABLET | Freq: Three times a day (TID) | ORAL | 0 refills | Status: DC | PRN

## 2019-02-27 NOTE — Addendum Note (Signed)
Addended byDebbe Mounts on: 02/27/2019 09:20 AM   Modules accepted: Orders

## 2019-03-02 ENCOUNTER — Ambulatory Visit (HOSPITAL_COMMUNITY)
Admission: RE | Admit: 2019-03-02 | Discharge: 2019-03-02 | Disposition: A | Discharge status: Home / Self Care | Payer: Medicare HMO | Source: Ambulatory Visit | Attending: Physician Assistant | Admitting: Physician Assistant

## 2019-03-02 ENCOUNTER — Other Ambulatory Visit: Payer: Self-pay

## 2019-03-02 ENCOUNTER — Encounter (HOSPITAL_COMMUNITY): Payer: Self-pay

## 2019-03-02 DIAGNOSIS — R188 Other ascites: Secondary | ICD-10-CM | POA: Diagnosis not present

## 2019-03-02 DIAGNOSIS — K7460 Unspecified cirrhosis of liver: Secondary | ICD-10-CM

## 2019-03-02 LAB — PROTEIN, PLEURAL OR PERITONEAL FLUID: Total protein, fluid: 4.7 g/dL

## 2019-03-02 LAB — BODY FLUID CELL COUNT WITH DIFFERENTIAL
Eos, Fluid: 0 %
Lymphs, Fluid: 34 %
Monocyte-Macrophage-Serous Fluid: 40 % — ABNORMAL LOW (ref 50–90)
Neutrophil Count, Fluid: 26 % — ABNORMAL HIGH (ref 0–25)
Other Cells, Fluid: 1 %
Total Nucleated Cell Count, Fluid: 336 cu mm (ref 0–1000)

## 2019-03-02 LAB — ALBUMIN, PLEURAL OR PERITONEAL FLUID: Albumin, Fluid: 2.9 g/dL

## 2019-03-02 NOTE — Procedures (Signed)
PreOperative Dx: Cirrhosis, ascites Postoperative Dx: Cirrhosis, ascites Procedure:   US guided paracentesis Radiologist:  Patsy Zaragoza Anesthesia:  10 ml of1% lidocaine Specimen:  3.7 L of cloudy yellow ascitic fluid EBL:   < 1 ml Complications: None  

## 2019-03-02 NOTE — Progress Notes (Signed)
Paracentesis complete no signs of distress.  

## 2019-03-03 LAB — PATHOLOGIST SMEAR REVIEW

## 2019-03-04 ENCOUNTER — Ambulatory Visit (HOSPITAL_COMMUNITY): Payer: Medicare HMO

## 2019-03-04 ENCOUNTER — Telehealth: Payer: Self-pay

## 2019-03-04 NOTE — Telephone Encounter (Signed)
-----   Message from Levin Erp, [place] sent at 03/04/2019  8:27 AM EDT ----- Can you find out if patient missed his appt for MRI. Thanks-JLL ----- Message ----- From: SYSTEM Sent: 03/04/2019  12:05 AM EDT To: Levin Erp, PA

## 2019-03-04 NOTE — Telephone Encounter (Signed)
Patient is scheduled for MRI on 03/10/19

## 2019-03-09 ENCOUNTER — Other Ambulatory Visit: Payer: Self-pay

## 2019-03-09 ENCOUNTER — Telehealth: Payer: Self-pay

## 2019-03-09 DIAGNOSIS — K7460 Unspecified cirrhosis of liver: Secondary | ICD-10-CM

## 2019-03-09 NOTE — Telephone Encounter (Signed)
Patient scheduled for CT of abdomin at [facility]. Patient to arrive at 9:45am and be NPO 4 hrs. Before. Patient aware of the above.

## 2019-03-09 NOTE — Telephone Encounter (Signed)
----- Message from Levin Erp, [place] sent at 03/09/2019  8:25 AM EDT ----- Regarding: FW: order for pelvis Can we order CT abdomen for cirrhosis. Thanks-JLL ----- Message ----- From: Yetta Flock, MD Sent: 03/09/2019   8:01 AM EDT To: Pam Drown, [place] Subject: RE: order for pelvis                           Thanks for trying [facility] and looking into it.   Given he [name] cirrhosis and they are considering him for transplant he warrants cross sectional imaging. I would try a CT scan of the liver with contrast and see if that would be approved? I'm assuming MRI was declined as his [place] did not show a mass lesion and that he [name] not had a prior CT. Thanks ----- Message ----- From: Levin Erp, PA Sent: 03/06/2019  12:03 PM EDT To: Joellyn Haff, MD Subject: RE: order for pelvis                           Apparently just plain not approved. I tried. I will forward to Dr. Havery Moros. We can cancel imaging and let patient know. Thanks-JLL  Dr. Havery Moros- MRI not approved by insurance. U/S done the other day with labs. Any other recommendations at this point?  Thanks-JLL ----- Message ----- From: Darden Dates Sent: 03/05/2019  12:10 PM EDT To: Levin Erp, PA Subject: RE: order for pelvis                           Ok, thanks for getting back with me. I appreciate it! Amy ----- Message ----- From: Levin Erp, PA Sent: 03/05/2019  11:54 AM EDT To: Darden Dates Subject: RE: order for pelvis                           I did not know. I called. I have a peer to peer set up for tomorrow at 12:00. I will let you know.  Thanks-JLL ----- Message ----- From: Darden Dates Sent: 03/05/2019  11:02 AM EDT To: Levin Erp, PA Subject: FW: order for pelvis                           [name], Please see msg below.  I have not had a reply from Rovonda so I thought I should go ahead and  forward to you to see if you were asked to call. Thanks, Amy ----- Message ----- From: Darden Dates Sent: 03/04/2019  12:58 PM EDT To: Debbe Mounts, CMA Subject: RE: order for pelvis                           Ok, need you now.  Case# [id number] [name] gone peer to peer with Dr. Havery Moros or [facility]. You will need to call [phone] and press option 4 to set up appt for them. Thanks, Amy ----- Message ----- From: Debbe Mounts, CMA Sent: 03/03/2019   2:07 PM EDT To: Darden Dates Subject: RE: order for pelvis  Okay, thank you. Let me know if you need me to do something.   Rovonda ----- Message ----- From: Darden Dates Sent: 03/03/2019  12:27 PM EDT To: Debbe Mounts, CMA Subject: RE: order for pelvis                           Just FYI, this pt's MRI is still in review because the ultrasound report was not ready until yesterday afternoon.  His appt may be rescheduled for him by the hospital.  [name] a cut off time of like 2pm the day before a procedure that an Josem Kaufmann [name] to be obtained.  There will prob be nothing you will need to do.  They will prob call the pt and push him out.  I'll keep watching and let you know what I hear later. Thanks, Amy ----- Message ----- From: Debbe Mounts, CMA Sent: 02/27/2019   9:21 AM EDT To: Darden Dates Subject: RE: order for pelvis                           Order for MR Pelvis cancelled - ok per Ellouise Newer. I called and cancelled with scheduling. And pt can not move dates as his transportation [name] already made arrangements with their job.   Thanks,   Rovonda    ----- Message ----- From: Darden Dates Sent: 02/27/2019   8:51 AM EDT To: Debbe Mounts, CMA Subject: order for pelvis                               Ro, Can you tell me why there is an order for MR Pelvis? Note just states Abd.  Also, you will need to push out MR appt further from ultrasound appt.  I have to send  report when I do precert for MR.  I would say push it out atleast 4 days from ultrasound appt. Thanks, Amy

## 2019-03-10 ENCOUNTER — Ambulatory Visit (HOSPITAL_COMMUNITY): Payer: Medicare HMO

## 2019-03-23 ENCOUNTER — Ambulatory Visit (HOSPITAL_COMMUNITY)
Admission: RE | Admit: 2019-03-23 | Discharge: 2019-03-23 | Disposition: A | Discharge status: Home / Self Care | Payer: Medicare HMO | Source: Ambulatory Visit | Attending: Physician Assistant | Admitting: Physician Assistant

## 2019-03-23 ENCOUNTER — Other Ambulatory Visit: Payer: Self-pay

## 2019-03-23 DIAGNOSIS — K7460 Unspecified cirrhosis of liver: Secondary | ICD-10-CM | POA: Diagnosis not present

## 2019-03-23 MED ORDER — IOHEXOL 300 MG/ML  SOLN
100.0000 mL | Freq: Once | INTRAMUSCULAR | Status: AC | PRN
  Administered 2019-03-23: 12:00:00 100 mL via INTRAVENOUS

## 2019-03-24 ENCOUNTER — Ambulatory Visit (INDEPENDENT_AMBULATORY_CARE_PROVIDER_SITE_OTHER): Payer: Medicare HMO | Admitting: *Deleted

## 2019-03-24 DIAGNOSIS — I509 Heart failure, unspecified: Secondary | ICD-10-CM

## 2019-03-24 DIAGNOSIS — I420 Dilated cardiomyopathy: Secondary | ICD-10-CM

## 2019-03-25 ENCOUNTER — Encounter (HOSPITAL_COMMUNITY): Payer: Self-pay | Admitting: Internal Medicine

## 2019-03-25 ENCOUNTER — Ambulatory Visit (HOSPITAL_COMMUNITY)
Admission: RE | Admit: 2019-03-25 | Discharge: 2019-03-25 | Disposition: A | Discharge status: Home / Self Care | Payer: Medicare HMO | Source: Ambulatory Visit | Attending: Internal Medicine | Admitting: Internal Medicine

## 2019-03-25 ENCOUNTER — Other Ambulatory Visit: Payer: Self-pay

## 2019-03-25 VITALS — BP 112/60 | HR 84 | Wt 145.0 lb

## 2019-03-25 DIAGNOSIS — G8929 Other chronic pain: Secondary | ICD-10-CM | POA: Diagnosis not present

## 2019-03-25 DIAGNOSIS — Z8249 Family history of ischemic heart disease and other diseases of the circulatory system: Secondary | ICD-10-CM | POA: Diagnosis not present

## 2019-03-25 DIAGNOSIS — Z7982 Long term (current) use of aspirin: Secondary | ICD-10-CM | POA: Diagnosis not present

## 2019-03-25 DIAGNOSIS — G4700 Insomnia, unspecified: Secondary | ICD-10-CM | POA: Diagnosis not present

## 2019-03-25 DIAGNOSIS — F329 Major depressive disorder, single episode, unspecified: Secondary | ICD-10-CM | POA: Diagnosis not present

## 2019-03-25 DIAGNOSIS — K7469 Other cirrhosis of liver: Secondary | ICD-10-CM

## 2019-03-25 DIAGNOSIS — I509 Heart failure, unspecified: Secondary | ICD-10-CM | POA: Diagnosis not present

## 2019-03-25 DIAGNOSIS — Z905 Acquired absence of kidney: Secondary | ICD-10-CM | POA: Diagnosis not present

## 2019-03-25 DIAGNOSIS — K7460 Unspecified cirrhosis of liver: Secondary | ICD-10-CM | POA: Insufficient documentation

## 2019-03-25 DIAGNOSIS — M419 Scoliosis, unspecified: Secondary | ICD-10-CM | POA: Diagnosis not present

## 2019-03-25 DIAGNOSIS — K219 Gastro-esophageal reflux disease without esophagitis: Secondary | ICD-10-CM | POA: Insufficient documentation

## 2019-03-25 DIAGNOSIS — I071 Rheumatic tricuspid insufficiency: Secondary | ICD-10-CM | POA: Diagnosis not present

## 2019-03-25 DIAGNOSIS — I428 Other cardiomyopathies: Secondary | ICD-10-CM | POA: Diagnosis not present

## 2019-03-25 DIAGNOSIS — I5022 Chronic systolic (congestive) heart failure: Secondary | ICD-10-CM | POA: Insufficient documentation

## 2019-03-25 DIAGNOSIS — Z85528 Personal history of other malignant neoplasm of kidney: Secondary | ICD-10-CM | POA: Insufficient documentation

## 2019-03-25 DIAGNOSIS — Z87891 Personal history of nicotine dependence: Secondary | ICD-10-CM | POA: Insufficient documentation

## 2019-03-25 DIAGNOSIS — R69 Illness, unspecified: Secondary | ICD-10-CM | POA: Diagnosis not present

## 2019-03-25 DIAGNOSIS — Z79899 Other long term (current) drug therapy: Secondary | ICD-10-CM | POA: Diagnosis not present

## 2019-03-25 DIAGNOSIS — Z6710 Type A blood, Rh positive: Secondary | ICD-10-CM | POA: Diagnosis not present

## 2019-03-25 DIAGNOSIS — M1990 Unspecified osteoarthritis, unspecified site: Secondary | ICD-10-CM | POA: Diagnosis not present

## 2019-03-25 DIAGNOSIS — Z95810 Presence of automatic (implantable) cardiac defibrillator: Secondary | ICD-10-CM | POA: Diagnosis not present

## 2019-03-25 LAB — CUP PACEART REMOTE DEVICE CHECK
Battery Remaining Longevity: 72 mo
Battery Remaining Percentage: 78 %
Brady Statistic RV Percent Paced: 0 %
Date Time Interrogation Session: 20201014153600
HighPow Impedance: 80 Ohm
Implantable Lead Implant Date: 20121214
Implantable Lead Location: 753860
Implantable Lead Model: 180
Implantable Lead Serial Number: 307189
Implantable Pulse Generator Implant Date: 20121214
Lead Channel Impedance Value: 532 Ohm
Lead Channel Pacing Threshold Amplitude: 0.7 V
Lead Channel Pacing Threshold Pulse Width: 0.4 ms
Lead Channel Setting Pacing Amplitude: 2.4 V
Lead Channel Setting Pacing Pulse Width: 0.4 ms
Lead Channel Setting Sensing Sensitivity: 0.4 mV
Pulse Gen Serial Number: 118994

## 2019-03-25 LAB — COMPREHENSIVE METABOLIC PANEL
ALT: 25 U/L (ref 0–44)
AST: 38 U/L (ref 15–41)
Albumin: 3.7 g/dL (ref 3.5–5.0)
Alkaline Phosphatase: 197 U/L — ABNORMAL HIGH (ref 38–126)
Anion gap: 12 (ref 5–15)
BUN: 15 mg/dL (ref 6–20)
CO2: 27 mmol/L (ref 22–32)
Calcium: 9.7 mg/dL (ref 8.9–10.3)
Chloride: 97 mmol/L — ABNORMAL LOW (ref 98–111)
Creatinine, Ser: 1.2 mg/dL (ref 0.61–1.24)
GFR calc Af Amer: >60 mL/min (ref >60)
GFR calc non Af Amer: >60 mL/min (ref >60)
Glucose, Bld: 103 mg/dL — ABNORMAL HIGH (ref 70–99)
Potassium: 4.2 mmol/L (ref 3.5–5.1)
Sodium: 136 mmol/L (ref 135–145)
Total Bilirubin: 0.8 mg/dL (ref 0.3–1.2)
Total Protein: 7.8 g/dL (ref 6.5–8.1)

## 2019-03-25 LAB — BRAIN NATRIURETIC PEPTIDE: B Natriuretic Peptide: 2704 pg/mL — ABNORMAL HIGH (ref 0.0–100.0)

## 2019-03-25 MED ORDER — POTASSIUM CHLORIDE CRYS ER 20 MEQ PO TBCR: 20.0000 meq | EXTENDED_RELEASE_TABLET | ORAL | 3 refills | Status: DC

## 2019-03-25 MED ORDER — METOLAZONE 2.5 MG PO TABS: 2.5000 mg | ORAL_TABLET | ORAL | 0 refills | Status: DC

## 2019-03-25 MED ORDER — ENTRESTO 49-51 MG PO TABS: 1.0000 | ORAL_TABLET | Freq: Two times a day (BID) | ORAL | 3 refills | Status: DC

## 2019-03-25 MED ORDER — CARVEDILOL 6.25 MG PO TABS: ORAL_TABLET | ORAL | 5 refills | Status: DC

## 2019-03-25 NOTE — Progress Notes (Signed)
Patient ID: [name], male   DOB: 11-12-1970, 48 y.o.   MRN: [id number]    Primary Cardiologist: Dr Domenic Polite HF MD: Dr Haroldine Laws [name]: Dr Mosetta Pigeon  HPI: [name] is 48 year old with history of NICM (diagnosed 10/2010 - norm cors 2012 and 9/73), chronic systolic heart failure EF 15% (08/2013), S/P ICD [employer], smoker, Wilms tumor s/p nephrectomy at age 30 had chemoradiation, scoliosis.    We saw him at the end of March 2015 and had large recurrent R pleural effusion. Referred to Dr. Prescott Gum who placed Pleurex catheter on 4/10, removed on 09/28/13.   CPX (5/15) with RER 1.06, VO2 max 18.5, VE/VCO2 slope 32.1.  Moderate to severe functional limitation, circulatory.   CPX 09/17/16 Peak VO2: 20.5 (53% predicted peak VO2) Slope 35 Underwent L/RHC on 02/12/17. Had normal coronaries and relatively well-compensated hemodynamics. EF 20%.  Earlier this year we increased Entresto to 66/103.  Felt lightheaded and had stomach discomfort so he cut back.   Has been evaluated at [facility] by Dr Mosetta Pigeon for possible transplant. Seen in 8/20 and concern was for possible cirrhosis given progressive ascites. .   CPX 8/20 with pVO2 17.1 (down from 20.5)  Slope 35 (stable).   Paracentesis 03/02/19: 3.7 L off   Here for routine f/u. Since we last saw him had paracentesis and also seen by Dr. Havery Moros in GI. Ab CT 03/23/19 confirmed cirrhosis. Says he feels much better after paracentesis. Able to do all activities with any significant sob or fatigue unless he goes to fast. No edema, orthopnea or PND. Ab bloating much better.    Albumin 4.2  INR 1.3 on 9/20  Echo 6/20 EF 15% RV mild HK with severe TR Personally reviewed  Echo 01/29/19   RA = 17 RV = 46/18 PA = 51/22 (33) PCW = 28 Fick cardiac output/index =4.0/2.2 PVR =1.0 WU Ao sat = 99% PA sat = 62%, 62% RA/PCWP = 0.61 PaPI = 1.71  CPX 01/15/19   FVC 2.85 (57%)    FEV1 2.04 (51%)     FEV1/FVC 72 (89%)     MVV 79 (50%)     Resting HR: 96 Peak HR: 157  (90% age predicted max HR)  BP rest: 92/64 BP peak: 100/58  Peak VO2: 17.7 (47% predicted peak VO2)  VE/VCO2 slope: 35  OUES: 1.30  Peak RER: 1.18  Ventilatory Threshold: 14.0 (37% predicted or measured peak VO2 and 79% measured PVO2)  VE/MVV: 58%  O2pulse: 8  (57% predicted O2pulse)    ECHO 04/2014 EF 10% RV mildly dilated.   ECHO 2/18 EF 15% Mild Richard RV mild HK. Severe TR. Triv Richard . RV ok Echo 11/2016  EF 15% RV ok. Moderate to severe TR  Cath 02/12/17:  Normal cors  Ao = 91/60 (74) LV = 102/18 RA =  2 RV = 48/5 PA = 48/21 (31) PCW = 20 Fick cardiac output/index = 4.0/2.3 PVR = 2.8 WU Ao sat = 95% PA sat = 68%, 70% RA/PCWP = 0.10 PaPI = 13.5  CPX 09/17/16 Resting HR: 86 Peak HR: 150  (85% age predicted max HR) BP rest: 96/80 BP peak: 118/72 Peak VO2: 20.5 (53% predicted peak VO2) Ve/VCO2 slope: 35 OUES: 1.38 Peak RER: 1.04 Ventilatory Threshold: 15.7 (41% predicted or measured peak VO2) Peak RR 32 Peak Ventilation: 47.5 VE/MVV: 50% PETCO2 at peak: 30 O2pulse: 8  (62% predicted O2pulse)  CPX 5/15 Resting HR: 89 Peak HR: 138  (77% age predicted  max HR) BP rest: 106/68 BP peak: 126/74 (IPE) Peak VO2: 18.5 (46.5% predicted peak VO2) VE/VCO2 slope: 32.1 OUES: 1.33 Peak RER: 1.06 Ventilatory Threshold: 13.5 (33.9% predicted peak VO2) Peak RR 31 Peak Ventilation: 37.1 VE/MVV: 62.7% PETCO2 at peak: 33 O2pulse: 7  (58% predicted O2pulse)  SH: Lives with his son 39 year old . Unemployed. Disability approved 3 years ago. Does not drink alcohol. Dips tobacco.   FH: Grandfather MI  ROS: All systems negative except as listed in HPI, PMH and Problem List.  Past Medical History:  Diagnosis Date  . AICD (automatic cardioverter/defibrillator) present   . Arthritis   . Cardiomyopathy, nonischemic (HCC)    takes Digoxin daily and Carvedilol daily  . CHF (congestive heart failure) (HCC)    takes Lasix daily as well  Aldactone  . Chronic back pain   . Chronic systolic heart failure ([place])   . Depression    takes Prozac daily  . GERD (gastroesophageal reflux disease)    takes Omeprazole daily  . Hx of Alcohol consumption heavy    rare beer currently  . hx of Tobacco abuse    quit smoking 2014  . Hyperlipidemia    was on Simvastatin but has been off a year  . Insomnia    takes Ambien as needed(just got script yesterday)  . Orthostatic hypotension   . Scoliosis   . 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 45 (XRT and chemo)    Current Outpatient Medications  Medication Sig Dispense Refill  . aspirin 81 MG tablet Take 81 mg by mouth daily.      . Blood Glucose Monitoring Suppl (BLOOD GLUCOSE SYSTEM PAK) KIT Please dispense based on patient and insurance preference. Use to monitor FSBS 2-3x weekly. Dx: E11.9. 1 each 1  . carvedilol (COREG) 3.125 MG tablet TAKE 1 TABLET BY MOUTH TWICE DAILY WITH A MEAL (Patient taking differently: Take 3.125 mg by mouth 2 (two) times daily. ) 180 tablet 0  . cetirizine (ZYRTEC) 10 MG tablet Take 1 tablet (10 mg total) by mouth daily. 30 tablet 11  . cyclobenzaprine (FLEXERIL) 10 MG tablet Take 1 tablet (10 mg total) by mouth 3 (three) times daily as needed for muscle spasms. 10 tablet 0  . ENTRESTO 49-51 MG Take 1 tablet by mouth twice daily (Patient taking differently: Take 1 tablet by mouth 2 (two) times daily. ) 180 tablet 3  . fluticasone (FLONASE) 50 MCG/ACT nasal spray Place 2 sprays into both nostrils daily. 16 g 6  . Glucose Blood (BLOOD GLUCOSE TEST STRIPS) STRP Please dispense based on patient and insurance preference. Use to monitor FSBS 2-3x weekly. Dx: E11.9. 50 each 1  . Lancet Devices MISC Please dispense based on patient and insurance preference. Use to monitor FSBS 2-3x weekly. Dx: E11.9. 1 each 1  . Lancets MISC Please dispense based on patient and insurance preference. Use to monitor FSBS 2-3x weekly. Dx: E11.9. 50 each 1  . metolazone (ZAROXOLYN) 2.5 MG tablet  Take 1 tablet (2.5 mg total) by mouth as directed. By the heart failure clinic (Patient taking differently: Take 2.5 mg by mouth daily as needed (for excession fluid retention.). By the heart failure clinic) 15 tablet 0  . Multiple Vitamin (MULTIVITAMIN WITH MINERALS) TABS tablet Take 1 tablet by mouth daily.    Marland Kitchen oxyCODONE (OXY IR/ROXICODONE) 5 MG immediate release tablet Take 1 tablet (5 mg total) by mouth 3 (three) times daily as needed for severe pain. 60 tablet 0  .  potassium chloride SA (K-DUR) 20 MEQ tablet Take 1 tablet (20 mEq total) by mouth as directed. Take1 tab with Metalazone (Patient taking differently: Take 20 mEq by mouth daily as needed (with excessive fluid retention take with metolazone). Take1 tab with Metalazone) 90 tablet 3  . spironolactone (ALDACTONE) 25 MG tablet Take 1 tablet (25 mg total) by mouth daily. 90 tablet 3  . torsemide (DEMADEX) 20 MG tablet Take 2 tablets (40 mg total) by mouth daily. 180 tablet 3   No current facility-administered medications for this encounter.      PHYSICAL EXAM: Vitals:   03/25/19 1133  BP: 112/60  Pulse: 84  SpO2: 98%  Weight: 65.8 kg (145 lb)   Filed Weights   03/25/19 1133  Weight: 65.8 kg (145 lb)   General:  Well appearing. No resp difficulty HEENT: normal Neck: supple. JVP 7-8 + prominent CV waves. Carotids 2+ bilat; no bruits. No lymphadenopathy or thryomegaly appreciated. Cor: PMI nondisplaced. Regular rate & rhythm. No rubs, gallops or murmurs. Lungs: clear Abdomen: soft, nontender, minimally distended. No hepatosplenomegaly. No bruits or masses. Good bowel sounds. Extremities: no cyanosis, clubbing, rash, edema Neuro: alert & orientedx3, cranial nerves grossly intact. moves all 4 extremities w/o difficulty. Affect pleasant    ASSESSMENT & PLAN: 1. Chronic Systolic HF:  Nonischemic cardiomyopathy, ECHO 03/2014 EF 10%. Echo 2/18 EF 15% RV mild HK  [employer] ICD.   - Echo 6/20 EF 15% RV normal severe TR -  CPX test 8/20 with significant decline form previous.  - Cath 9/18: normal cors. Relatively well-compensated hemodynmaics - W/u has revealed evidence of cirrhosis which I suspect is due to a combination of ETOH and RV failure/severe TR. Synthetic function currently seems ok (Albumin 4.2  INR 1.3 on 9/20) - I doubt TV repair will be sufficient for him so question will be if he will still be a transplant candidate. He has f/u with Dr. Mosetta Pigeon in December to discuss - Symptomatically stable NYHA II - Volume status improved with torsemide 20 bid - Continue entresto to 49/51. Unable to tolerate 97/103 - Increase carvedilol to 3.125/6.25 and then 6.25 bid - Continue spiro 25 daily  - Unable to tolerate digoxin due to heartburn - blood type A-pos  2. Severe TR - Echo with moderate to severe TR but TV seems structurally intact. ? If related to ICD wire or just dilated annulus.  - I doubt TV repair will be sufficient for him so question will be if he will still be a transplant candidate. He has f/u with Dr. Mosetta Pigeon in December to discuss  3. Cirrhosis - ongoing w/u as above - appreciate GIs input   Glori Bickers, MD  12:15 PM

## 2019-03-25 NOTE — Patient Instructions (Signed)
Lab work done today. We will contact you only if your labs are abnormal.  INCREASE Carvedilol dose gradually:  Take 3.125 mg in AM and increase PM dose to 6.25 mg for 2 weeks, if feeling ok on this dose then increase to 6.25 mg twice daily, we have sent you in a new prescription for 6.25 mg tablets.  Your physician recommends that you schedule a follow-up appointment in: 4 weeks with Dr. Haroldine Laws.  At the [facility], you and your health needs are our priority. As part of our continuing mission to provide you with exceptional heart care, we have created designated Provider Care Teams. These Care Teams include your primary Cardiologist (physician) and Advanced Practice Providers (APPs- Physician Assistants and Nurse Practitioners) who all work together to provide you with the care you need, when you need it.   You may see any of the following providers on your designated Care Team at your next follow up: Marland Kitchen Dr Glori Bickers . Dr Loralie Champagne . Darrick Grinder, NP . Lyda Jester, PA   Please be sure to bring in all your medications bottles to every appointment.

## 2019-03-30 ENCOUNTER — Ambulatory Visit (INDEPENDENT_AMBULATORY_CARE_PROVIDER_SITE_OTHER): Payer: Medicare HMO | Admitting: Family Medicine

## 2019-03-30 ENCOUNTER — Other Ambulatory Visit: Payer: Self-pay

## 2019-03-30 ENCOUNTER — Encounter: Payer: Self-pay | Admitting: Family Medicine

## 2019-03-30 VITALS — BP 102/64 | HR 86 | Temp 98.2°F | Resp 14 | Ht 69.0 in | Wt 149.0 lb

## 2019-03-30 DIAGNOSIS — Z0001 Encounter for general adult medical examination with abnormal findings: Secondary | ICD-10-CM

## 2019-03-30 DIAGNOSIS — R7302 Impaired glucose tolerance (oral): Secondary | ICD-10-CM | POA: Diagnosis not present

## 2019-03-30 DIAGNOSIS — Z23 Encounter for immunization: Secondary | ICD-10-CM

## 2019-03-30 DIAGNOSIS — Z0000 Encounter for general adult medical examination without abnormal findings: Secondary | ICD-10-CM

## 2019-03-30 DIAGNOSIS — I509 Heart failure, unspecified: Secondary | ICD-10-CM | POA: Diagnosis not present

## 2019-03-30 DIAGNOSIS — E785 Hyperlipidemia, unspecified: Secondary | ICD-10-CM

## 2019-03-30 DIAGNOSIS — G894 Chronic pain syndrome: Secondary | ICD-10-CM | POA: Diagnosis not present

## 2019-03-30 MED ORDER — OXYCODONE HCL 5 MG PO TABS: 5.0000 mg | ORAL_TABLET | Freq: Three times a day (TID) | ORAL | 0 refills | Status: DC | PRN

## 2019-03-30 NOTE — Patient Instructions (Signed)
F/U 4 months  FLu shot given  

## 2019-03-30 NOTE — Progress Notes (Signed)
Subjective:   Patient presents for Medicare Annual/Subsequent preventive examination.   Cirrhosis of the liver.  He is followed by GI he needs Twinrix immunization.  He is taking his diuretics as prescribed.    CHF-no changes in his medications.  There was some concern about his beta-blocker from GI standpoint however he requires his from his cardiovascular same point.  He has not had any chest pain no shortness of breath he was able to exercise over the weekend without difficulty.  No fluid retention.  He is following with his cardiologist.   Chronic pain -  Due for refill     Borderline DM  6.3% discussed diet avoiding the sweets and  the carbs    Hyperlipidemia- due for fasting labs for lipids     Due for flu shot    Review Past Medical/Family/Social: Per EMR   Risk Factors  Current exercise habits: stays active  Dietary issues discussed: Blood sugars  Cardiac risk factors: CHF, Cirrhosis, HTN  Depression Screen  (Note: if answer to either of the following is "Yes", a more complete depression screening is indicated)  Over the past two weeks, have you felt down, depressed or hopeless? No Over the past two weeks, have you felt little interest or pleasure in doing things? No Have you lost interest or pleasure in daily life? No Do you often feel hopeless? No Do you cry easily over simple problems? No   Activities of Daily Living  In your present state of health, do you have any difficulty performing the following activities?:  Driving? No  Managing money? No  Feeding yourself? No  Getting from bed to chair? No  Climbing a flight of stairs? No  Preparing food and eating?: No  Bathing or showering? No  Getting dressed: No  Getting to the toilet? No  Using the toilet:No  Moving around from place to place: No  In the past year have you fallen or had a near fall?:No  Are you sexually active? No  Do you have more than one partner? No   Hearing Difficulties: No  Do you often  ask people to speak up or repeat themselves? No  Do you experience ringing or noises in your ears? No Do you have difficulty understanding soft or whispered voices? No  Do you feel that you have a problem with memory? No Do you often misplace items? No  Do you feel safe at home? Yes  Cognitive Testing  Alert? Yes Normal Appearance?Yes  Oriented to person? Yes Place? Yes  Time? Yes  Recall of three objects? Yes  Can perform simple calculations? Yes  Displays appropriate judgment?Yes  Can read the correct time from a watch face?Yes   List the Names of Other Physician/Practitioners you currently use:  Per above ,cardiology, [facility]    Screening Tests / Date                   Influenza Vaccine Due Tetanus/tdap - Due   ROS: GEN- Denes  fatigue, denies  fever, weight loss,weakness, recent illness HEENT- denies eye drainage, change in vision, nasal discharge, CVS- denies chest pain, palpitations RESP- denies SOB, cough, wheeze ABD- denies N/V, change in stools, abd pain GU- denies dysuria, hematuria, dribbling, incontinence MSK- denies joint pain, muscle aches, injury Neuro- denies headache, dizziness, syncope, seizure activity  PHYSICAL: GEN- NAD, alert and oriented x3 HEENT- PERRL, EOMI, non injected sclera, pink conjunctiva, MMM, oropharynx clear Neck- Supple, no thryomegaly CVS- RRR, no murmur,  RESP-CTAB ABD-NABS,soft,NT,ND  EXT- No edema Pulses- Radial, DP- 2+  Fall/audit C/DEPRESSION neg  Assessment:    Annual wellness medicare exam   Plan:    During the course of the visit the patient was educated and counseled about appropriate screening and preventive services including:  Fasting labs to be obtained. Immunization he was given hepatitis A B vaccine in the setting of his cirrhosis.  Also given flu shot  CHF currently compensated followed by cardiology reviewed his medications everything is up-to-date.  Chronic pain his oxycodone was refilled no  changes at this time.  Avoiding acetaminophen and NSAIDs  Borderline diabetes mellitus recheck his A1c no current medications.  Trying to control with diet Diet review for nutrition referral? Yes ____ Not Indicated __x__  Patient Instructions (the written plan) was given to the patient.  Medicare Attestation  I have personally reviewed:  The patient's medical and social history  Their use of alcohol, tobacco or illicit drugs  Their current medications and supplements  The patient's functional ability including ADLs,fall risks, home safety risks, cognitive, and hearing and visual impairment  Diet and physical activities  Evidence for depression or mood disorders  The patient's weight, height, BMI, and visual acuity have been recorded in the chart. I have made referrals, counseling, and provided education to the patient based on review of the above and I have provided the patient with a written personalized care plan for preventive services.

## 2019-03-31 ENCOUNTER — Encounter: Payer: Self-pay | Admitting: Family Medicine

## 2019-03-31 LAB — BASIC METABOLIC PANEL
BUN/Creatinine Ratio: 22 (calc) (ref 6–22)
BUN: 28 mg/dL — ABNORMAL HIGH (ref 7–25)
CO2: 30 mmol/L (ref 20–32)
Calcium: 10.1 mg/dL (ref 8.6–10.3)
Chloride: 98 mmol/L (ref 98–110)
Creat: 1.25 mg/dL (ref 0.60–1.35)
Glucose, Bld: 124 mg/dL — ABNORMAL HIGH (ref 65–99)
Potassium: 5.1 mmol/L (ref 3.5–5.3)
Sodium: 136 mmol/L (ref 135–146)

## 2019-03-31 LAB — HEMOGLOBIN A1C
Hgb A1c MFr Bld: 6.3 % of total Hgb — ABNORMAL HIGH (ref <5.7)
Mean Plasma Glucose: 134 (calc)
eAG (mmol/L): 7.4 (calc)

## 2019-03-31 LAB — LIPID PANEL
Cholesterol: 221 mg/dL — ABNORMAL HIGH (ref <200)
HDL: 48 mg/dL (ref >=40)
LDL Cholesterol (Calc): 155 mg/dL (calc) — ABNORMAL HIGH
Non-HDL Cholesterol (Calc): 173 mg/dL (calc) — ABNORMAL HIGH (ref <130)
Total CHOL/HDL Ratio: 4.6 (calc) (ref <5.0)
Triglycerides: 77 mg/dL (ref <150)

## 2019-04-03 NOTE — Progress Notes (Signed)
Remote ICD transmission.   

## 2019-04-07 ENCOUNTER — Encounter: Payer: Self-pay | Admitting: Gastroenterology

## 2019-04-07 ENCOUNTER — Ambulatory Visit (INDEPENDENT_AMBULATORY_CARE_PROVIDER_SITE_OTHER): Payer: Medicare HMO | Admitting: Gastroenterology

## 2019-04-07 VITALS — BP 110/78 | HR 78 | Temp 98.3°F | Ht 69.0 in | Wt 145.8 lb

## 2019-04-07 DIAGNOSIS — K7460 Unspecified cirrhosis of liver: Secondary | ICD-10-CM

## 2019-04-07 DIAGNOSIS — I509 Heart failure, unspecified: Secondary | ICD-10-CM

## 2019-04-07 DIAGNOSIS — R188 Other ascites: Secondary | ICD-10-CM | POA: Diagnosis not present

## 2019-04-07 NOTE — Patient Instructions (Addendum)
If you are age 46 or older, your body mass index should be between 23-30. Your Body mass index is 21.53 kg/m. If this is out of the aforementioned range listed, please consider follow up with your Primary Care Provider.  If you are age 8 or younger, your body mass index should be between 19-25. Your Body mass index is 21.53 kg/m. If this is out of the aformentioned range listed, please consider follow up with your Primary Care Provider.   To help prevent the possible spread of infection to our patients, communities, and staff; we will be implementing the following measures:  As of now we are not allowing any visitors/family members to accompany you to any upcoming appointments with [facility]. If you have any concerns about this please contact our office to discuss prior to the appointment.    You will be due for an Ultrasound and labs in April 2021. We will remind you when it is time to schedule.   You will be due for an office visit in 6 months.  We will remind you when it is time to schedule that appointment.  Thank you for entrusting me with your care and for choosing [facility], Dr. Northview Cellar

## 2019-04-07 NOTE — Progress Notes (Signed)
HPI :  48 year old male with a history of severe nonischemic cardiomyopathy, EF 15%, history of cirrhosis, and ascites, here for a follow-up visit.  He was referred to [place] in recent months for cirrhosis.  He had has severe chronic CHF and is being evaluated for possible heart transplant at [facility].  He was noted to have worsening ascites was referred to [place] for further evaluation.  He does have a significant alcohol history.  He states some days he would drink several drinks a day, usually more on the weekend.  He drank heavily in his 74s, states he has not drank heavily in several years however.  He rarely drinks any alcohol at this time.  He had a serologic work-up done for chronic liver diseases since we have last seen him and these were negative.  He was not immune to hepatitis a and B and is started the vaccine series.  He had a paracentesis to clarify the etiology of his ascites.  His SAAG was greater than 1.1, and total protein value was 4.7.  He reports that he has felt significant improvement since the paracentesis, 3.7 L were withdrawn.  His diuretics are managed by Dr. Haroldine Laws, he takes Aldactone and torsemide and states this works fairly well for his edema.  He is on Coreg 6.25 mg twice a day.  We have held off on screening EGD for varices given he is already on Coreg and his significant cardiomyopathy.  He had a CT scan with and without contrast since his last visit.  He had evidence of cirrhosis with ascites, no evidence of mass lesion.  There was no obvious varices noted on this exam as well.  He thinks he has had heart failure for at least 10 years.  He denies any strong family history of cirrhosis or heart disease.  He denies any history of jaundice.  He is never had any history of hepatic encephalopathy or GI bleeding.  Looking back at his record he has had cirrhotic changes of his liver since at least 2014.  CT scan 03/23/19 - c/w cirrhosis, no concerning lesions, ascites   Past  Medical History:  Diagnosis Date  . AICD (automatic cardioverter/defibrillator) present   . Arthritis   . Cardiomyopathy, nonischemic (HCC)    takes Digoxin daily and Carvedilol daily  . CHF (congestive heart failure) (HCC)    takes Lasix daily as well Aldactone  . Chronic back pain   . Chronic systolic heart failure ([place])   . Cirrhosis ([place])   . Depression    takes Prozac daily  . GERD (gastroesophageal reflux disease)    takes Omeprazole daily  . Hx of Alcohol consumption heavy    rare beer currently  . hx of Tobacco abuse    quit smoking 2014  . Hyperlipidemia    was on Simvastatin but has been off a year  . Insomnia    takes Ambien as needed(just got script yesterday)  . Orthostatic hypotension   . Scoliosis   . 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 48 (XRT and chemo)     Past Surgical History:  Procedure Laterality Date  . CARPAL TUNNEL RELEASE Right 01/09/2013   Procedure: CARPAL TUNNEL RELEASE;  Surgeon: Winfield Cunas, MD;  Location: [facility];  Service: Neurosurgery;  Laterality: Right;  RIGHT carpal tunnel release  . CARPAL TUNNEL RELEASE Left 01/30/2013   Procedure: LEFT CARPAL TUNNEL RELEASE;  Surgeon: Winfield Cunas, MD;  Location: [facility];  Service:  Neurosurgery;  Laterality: Left;  LEFT Carpal Tunnel release  . CHEST TUBE INSERTION Right 09/09/2013   Procedure: INSERTION PLEURAL DRAINAGE CATHETER;  Surgeon: Ivin Poot, MD;  Location: [facility];  Service: Thoracic;  Laterality: Right;  . HYDROCELE EXCISION Right 04/11/2017   Procedure: RIGHT HYDROCELECTOMY ADULT;  Surgeon: Irine Seal, MD;  Location: [facility];  Service: Urology;  Laterality: Right;  . hydrocelectomy  2008  . ICD  05/25/2011   [employer] End[employer]ak [employer] SG lead/Energen single chamber device  . IMPLANTABLE CARDIOVERTER DEFIBRILLATOR IMPLANT N/A 05/25/2011   Procedure: IMPLANTABLE CARDIOVERTER DEFIBRILLATOR IMPLANT;  Surgeon: Evans Lance, MD;  Location: [facility] LAB;  Service:  Cardiovascular;  Laterality: N/A;  . NEPHRECTOMY  36 yrs ago   Wilms Tumor  . RIGHT HEART CATH N/A 01/29/2019   Procedure: RIGHT HEART CATH;  Surgeon: Jolaine Artist, MD;  Location: [place] CV LAB;  Service: Cardiovascular;  Laterality: N/A;  . RIGHT/LEFT HEART CATH AND CORONARY ANGIOGRAPHY N/A 02/12/2017   Procedure: RIGHT/LEFT HEART CATH AND CORONARY ANGIOGRAPHY;  Surgeon: Jolaine Artist, MD;  Location: [place] CV LAB;  Service: Cardiovascular;  Laterality: N/A;  . ULNAR NERVE TRANSPOSITION Right 01/09/2013   Procedure: ULNAR NERVE DECOMPRESSION/TRANSPOSITION;  Surgeon: Winfield Cunas, MD;  Location: [place] [facility];  Service: Neurosurgery;  Laterality: Right;  RIGHT ulnar nerve decompression   Family History  Problem Relation Age of Onset  . Diabetes Mother    Social History   Tobacco Use  . Smoking status: Former Smoker    Packs/day: 0.25    Years: 20.00    Pack years: 5.00    Types: Cigarettes    Quit date: 09/07/2012    Years since quitting: 6.5  . Smokeless tobacco: Current User    Types: Snuff  Substance Use Topics  . Alcohol use: Yes    Alcohol/week: 0.0 standard drinks    Comment: histoorically a heavy drinker ("beer only"), none for a couple months   . Drug use: No   Current Outpatient Medications  Medication Sig Dispense Refill  . aspirin 81 MG tablet Take 81 mg by mouth daily.      . Blood Glucose Monitoring Suppl (BLOOD GLUCOSE SYSTEM PAK) KIT Please dispense based on patient and insurance preference. Use to monitor FSBS 2-3x weekly. Dx: E11.9. 1 each 1  . carvedilol (COREG) 6.25 MG tablet TAKE 1 TABLET BY MOUTH TWICE DAILY WITH A MEAL 60 tablet 5  . cetirizine (ZYRTEC) 10 MG tablet Take 1 tablet (10 mg total) by mouth daily. 30 tablet 11  . cyclobenzaprine (FLEXERIL) 10 MG tablet Take 1 tablet (10 mg total) by mouth 3 (three) times daily as needed for muscle spasms. 10 tablet 0  . fluticasone (FLONASE) 50 MCG/ACT nasal spray Place 2 sprays into both  nostrils daily. 16 g 6  . Glucose Blood (BLOOD GLUCOSE TEST STRIPS) STRP Please dispense based on patient and insurance preference. Use to monitor FSBS 2-3x weekly. Dx: E11.9. 50 each 1  . Lancet Devices MISC Please dispense based on patient and insurance preference. Use to monitor FSBS 2-3x weekly. Dx: E11.9. 1 each 1  . Lancets MISC Please dispense based on patient and insurance preference. Use to monitor FSBS 2-3x weekly. Dx: E11.9. 50 each 1  . metolazone (ZAROXOLYN) 2.5 MG tablet Take 1 tablet (2.5 mg total) by mouth as directed. By the heart failure clinic 15 tablet 0  . Multiple Vitamin (MULTIVITAMIN WITH MINERALS) TABS tablet Take 1 tablet by mouth  daily.    . oxyCODONE (OXY IR/ROXICODONE) 5 MG immediate release tablet Take 1 tablet (5 mg total) by mouth 3 (three) times daily as needed for severe pain. 60 tablet 0  . potassium chloride SA (KLOR-CON) 20 MEQ tablet Take 1 tablet (20 mEq total) by mouth as directed. Take1 tab with Metalazone 90 tablet 3  . sacubitril-valsartan (ENTRESTO) 49-51 MG Take 1 tablet by mouth 2 (two) times daily. 180 tablet 3  . spironolactone (ALDACTONE) 25 MG tablet Take 1 tablet (25 mg total) by mouth daily. 90 tablet 3  . torsemide (DEMADEX) 20 MG tablet Take 2 tablets (40 mg total) by mouth daily. 180 tablet 3   No current facility-administered medications for this visit.    Allergies  Allergen Reactions  . Ibuprofen Hives  . Nsaids     ELEVATED LFT'S     Review of Systems: All systems reviewed and negative except where noted in HPI.    Ct Abdomen W Wo Contrast  Result Date: 03/23/2019 CLINICAL DATA:  Cirrhosis. EXAM: CT ABDOMEN WITHOUT AND WITH CONTRAST TECHNIQUE: Multidetector CT imaging of the abdomen was performed following the standard protocol before and following the bolus administration of intravenous contrast. CONTRAST:  165m OMNIPAQUE IOHEXOL 300 MG/ML  SOLN COMPARISON:  None. FINDINGS: Lower chest: Trace right pleural effusion.   Cardiomegaly. Hepatobiliary: Macronodular hepatic contour with enlargement of the caudate, compatible with the clinical history of cirrhosis. Heterogeneous portal venous perfusion. No suspicious/enhancing hepatic lesions. Gallbladder is unremarkable. No intrahepatic or extrahepatic ductal dilatation. Pancreas: Within normal limits. Spleen: Spleen is normal in size. Adrenals/Urinary Tract: Adrenal glands are within normal limits. Status post left nephrectomy. Right kidney is within normal limits. No enhancing renal lesions. 3 mm nonobstructing right lower pole renal calculus (series 3/image 83). No hydronephrosis. Stomach/Bowel: Stomach is within normal limits. Visualized bowel is unremarkable. Vascular/Lymphatic: No evidence of abdominal aortic aneurysm. Atherosclerotic calcifications of the abdominal aorta and branch vessels. Portal vein is patent. No suspicious abdominopelvic lymphadenopathy. Other: Mild to moderate upper abdominal ascites. Musculoskeletal: Visualized osseous structures are within normal limits. IMPRESSION: Cirrhosis.  No suspicious/enhancing hepatic lesions. Mild to moderate upper abdominal ascites. Trace right pleural effusion. Status post left nephrectomy. 3 mm nonobstructing right lower pole renal calculus. No hydronephrosis. Electronically Signed   By: SJulian HyM.D.   On: 03/23/2019 13:26   Lab Results  Component Value Date   WBC 4.7 02/24/2019   HGB 12.0 (L) 02/24/2019   HCT 35.7 (L) 02/24/2019   MCV 95.1 02/24/2019   PLT 337.0 02/24/2019    Lab Results  Component Value Date   CREATININE 1.25 03/30/2019   BUN 28 (H) 03/30/2019   NA 136 03/30/2019   K 5.1 03/30/2019   CL 98 03/30/2019   CO2 30 03/30/2019    Lab Results  Component Value Date   ALT 25 03/25/2019   AST 38 03/25/2019   ALKPHOS 197 (H) 03/25/2019   BILITOT 0.8 03/25/2019    Lab Results  Component Value Date   INR 1.3 (H) 02/24/2019   INR 1.09 02/06/2017   INR 1.18 10/19/2015       Physical Exam: BP 110/78   Pulse 78   Temp 98.3 F (36.8 C)   Ht _0  (1.753 m)   Wt 145 lb 12.8 oz (66.1 kg)   BMI 21.53 kg/m  Constitutional: Pleasant,well-developed, male in no acute distress. HEENT: Normocephalic and atraumatic. Conjunctivae are normal. No scleral icterus. Neck supple.  Cardiovascular: Normal rate, regular rhythm.  Pulmonary/chest:  Effort normal and breath sounds normal. No wheezing, rales or rhonchi. Abdominal: Soft, mildly distended, nontender. There are no masses palpable. No hepatomegaly. Extremities: no edema Lymphadenopathy: No cervical adenopathy noted. Neurological: Alert and oriented to person place and time. No asterixis Skin: Skin is warm and dry. No rashes noted. Psychiatric: Normal mood and affect. Behavior is normal.   ASSESSMENT AND PLAN: 48 year old male here for reassessment of the following:  Cirrhosis with ascites / CHF - patient with a history of CHF for at least 10 years, on imaging it looks like he has had cirrhosis since at least 2014 or so.  It is unclear what caused his cirrhosis, it could be cardiac +/- alcohol in etiology. He has ascites but has no other decompensations to date.  Paracentesis was performed, SAAG > 1.1 and total protein is 4.7.  This analysis suggest that his ascites are more likely due to heart failure than his cirrhosis.  He is being evaluated for heart transplant at [facility].  Given his cirrhosis this may limit his candidacy for this, but defer to [facility] regarding that decision.  His cross-sectional imaging shows no evidence of HCC and no varices.  Given he is already on Coreg, and his comorbidities, will forego screening EGD at this time as he is being treated for varices regardless with the Coreg.  We will be checking INR, AFP, LFTs every 6 months, plan on another surveillance ultrasound in April 2020.  He will complete his vaccination series for hepatitis a and B.  He will see me every 6 months or sooner for his liver disease.   I advised he needs to completely abstain from drinking alcohol, he has no issues doing that.  He can let me know if he needs a paracentesis in the interim again, that provided significant benefit.  I will touch base with Dr. Haroldine Laws about his case.  Patient agreed with the plan, all questions answered  Red [place] Cellar, MD [facility]

## 2019-04-17 ENCOUNTER — Other Ambulatory Visit: Payer: Self-pay

## 2019-04-23 ENCOUNTER — Other Ambulatory Visit: Payer: Self-pay

## 2019-04-23 ENCOUNTER — Encounter (HOSPITAL_COMMUNITY): Payer: Self-pay | Admitting: Internal Medicine

## 2019-04-23 ENCOUNTER — Ambulatory Visit (HOSPITAL_COMMUNITY)
Admission: RE | Admit: 2019-04-23 | Discharge: 2019-04-23 | Disposition: A | Discharge status: Home / Self Care | Payer: Medicare HMO | Source: Ambulatory Visit | Attending: Internal Medicine | Admitting: Internal Medicine

## 2019-04-23 VITALS — BP 96/76 | HR 86 | Wt 146.0 lb

## 2019-04-23 DIAGNOSIS — Z85528 Personal history of other malignant neoplasm of kidney: Secondary | ICD-10-CM | POA: Insufficient documentation

## 2019-04-23 DIAGNOSIS — M1990 Unspecified osteoarthritis, unspecified site: Secondary | ICD-10-CM | POA: Insufficient documentation

## 2019-04-23 DIAGNOSIS — I071 Rheumatic tricuspid insufficiency: Secondary | ICD-10-CM | POA: Diagnosis not present

## 2019-04-23 DIAGNOSIS — F329 Major depressive disorder, single episode, unspecified: Secondary | ICD-10-CM | POA: Insufficient documentation

## 2019-04-23 DIAGNOSIS — E785 Hyperlipidemia, unspecified: Secondary | ICD-10-CM | POA: Diagnosis not present

## 2019-04-23 DIAGNOSIS — Z87891 Personal history of nicotine dependence: Secondary | ICD-10-CM | POA: Diagnosis not present

## 2019-04-23 DIAGNOSIS — Z7982 Long term (current) use of aspirin: Secondary | ICD-10-CM | POA: Diagnosis not present

## 2019-04-23 DIAGNOSIS — M419 Scoliosis, unspecified: Secondary | ICD-10-CM | POA: Diagnosis not present

## 2019-04-23 DIAGNOSIS — K219 Gastro-esophageal reflux disease without esophagitis: Secondary | ICD-10-CM | POA: Diagnosis not present

## 2019-04-23 DIAGNOSIS — R188 Other ascites: Secondary | ICD-10-CM

## 2019-04-23 DIAGNOSIS — I5022 Chronic systolic (congestive) heart failure: Secondary | ICD-10-CM | POA: Diagnosis not present

## 2019-04-23 DIAGNOSIS — I428 Other cardiomyopathies: Secondary | ICD-10-CM | POA: Insufficient documentation

## 2019-04-23 DIAGNOSIS — K7460 Unspecified cirrhosis of liver: Secondary | ICD-10-CM | POA: Diagnosis not present

## 2019-04-23 DIAGNOSIS — R69 Illness, unspecified: Secondary | ICD-10-CM | POA: Diagnosis not present

## 2019-04-23 LAB — BASIC METABOLIC PANEL
Anion gap: 10 (ref 5–15)
BUN: 23 mg/dL — ABNORMAL HIGH (ref 6–20)
CO2: 27 mmol/L (ref 22–32)
Calcium: 9.3 mg/dL (ref 8.9–10.3)
Chloride: 97 mmol/L — ABNORMAL LOW (ref 98–111)
Creatinine, Ser: 1.16 mg/dL (ref 0.61–1.24)
GFR calc Af Amer: >60 mL/min (ref >60)
GFR calc non Af Amer: >60 mL/min (ref >60)
Glucose, Bld: 157 mg/dL — ABNORMAL HIGH (ref 70–99)
Potassium: 3.8 mmol/L (ref 3.5–5.1)
Sodium: 134 mmol/L — ABNORMAL LOW (ref 135–145)

## 2019-04-23 LAB — BRAIN NATRIURETIC PEPTIDE: B Natriuretic Peptide: 2900 pg/mL — ABNORMAL HIGH (ref 0.0–100.0)

## 2019-04-23 MED ORDER — FARXIGA 10 MG PO TABS: 10.0000 mg | ORAL_TABLET | Freq: Every day | ORAL | 6 refills | Status: DC

## 2019-04-23 NOTE — Patient Instructions (Signed)
Start Farxiga 10 mg daily  Labs done today, we will notify you of abnormal readings  Your physician recommends that you schedule a follow-up appointment in: 3 months  If you have any questions or concerns before your next appointment please send [place] a message through [place] or call our office at [phone].  At the [facility], you and your health needs are our priority. As part of our continuing mission to provide you with exceptional heart care, we have created designated Provider Care Teams. These Care Teams include your primary Cardiologist (physician) and Advanced Practice Providers (APPs- Physician Assistants and Nurse Practitioners) who all work together to provide you with the care you need, when you need it.   You may see any of the following providers on your designated Care Team at your next follow up: Marland Kitchen Dr Glori Bickers . Dr Loralie Champagne . Darrick Grinder, NP . Lyda Jester, PA   Please be sure to bring in all your medications bottles to every appointment.

## 2019-04-23 NOTE — Progress Notes (Addendum)
Patient ID: [name], male   DOB: Feb 13, 1971, 48 y.o.   MRN: [id number]    Primary Cardiologist: Dr Domenic Polite HF MD: Dr Haroldine Laws [name]: Dr Mosetta Pigeon  HPI: [name] is 48 year old with history of NICM (diagnosed 10/2010 - norm cors 2012 and 0/86), chronic systolic heart failure EF 15% (08/2013), S/P ICD [employer], smoker, Wilms tumor s/p nephrectomy at age 78 had chemoradiation, scoliosis.    We saw him at the end of March 2015 and had large recurrent R pleural effusion. Referred to Dr. Prescott Gum who placed Pleurex catheter on 4/10, removed on 09/28/13.   CPX (5/15) with RER 1.06, VO2 max 18.5, VE/VCO2 slope 32.1.  Moderate to severe functional limitation, circulatory.   CPX 09/17/16 Peak VO2: 20.5 (53% predicted peak VO2) Slope 35 Underwent L/RHC on 02/12/17. Had normal coronaries and relatively well-compensated hemodynamics. EF 20%.  Earlier this year we increased Entresto to 63/103.  Felt lightheaded and had stomach discomfort so he cut back.   Has been evaluated at [facility] by Dr Mosetta Pigeon for possible transplant. Seen in 8/20 and concern was for possible cirrhosis given progressive ascites. .   CPX 8/20 with pVO2 17.1 (down from 20.5)  Slope 35 (stable).   Paracentesis 03/02/19: 3.7 L off   Here for routine f/u. Since we last saw him had paracentesis and also seen by Dr. Havery Moros in GI. Ab CT 03/23/19 confirmed cirrhosis. Continues to feel much better after paracentesis. Able to go fishing and hunting without much problem as long as he takes his time. Denis SOB, orthopnea or PND. GI feels like liver disease would not preclude transplant.   Albumin 4.2  INR 1.3 on 9/20  Echo 6/20 EF 15% RV mild HK with severe TR Personally reviewed  Echo 01/29/19   RA = 17 RV = 46/18 PA = 51/22 (33) PCW = 28 Fick cardiac output/index =4.0/2.2 PVR =1.0 WU Ao sat = 99% PA sat = 62%, 62% RA/PCWP = 0.61 PaPI = 1.71  CPX 01/15/19   FVC 2.85 (57%)    FEV1 2.04 (51%)     FEV1/FVC 72 (89%)      MVV 79 (50%)    Resting HR: 96 Peak HR: 157  (90% age predicted max HR)  BP rest: 92/64 BP peak: 100/58  Peak VO2: 17.7 (47% predicted peak VO2)  VE/VCO2 slope: 35  OUES: 1.30  Peak RER: 1.18  Ventilatory Threshold: 14.0 (37% predicted or measured peak VO2 and 79% measured PVO2)  VE/MVV: 58%  O2pulse: 8  (57% predicted O2pulse)    ECHO 04/2014 EF 10% RV mildly dilated.   ECHO 2/18 EF 15% Mild Richard RV mild HK. Severe TR. Triv Richard . RV ok Echo 11/2016  EF 15% RV ok. Moderate to severe TR  Cath 02/12/17:  Normal cors  Ao = 91/60 (74) LV = 102/18 RA =  2 RV = 48/5 PA = 48/21 (31) PCW = 20 Fick cardiac output/index = 4.0/2.3 PVR = 2.8 WU Ao sat = 95% PA sat = 68%, 70% RA/PCWP = 0.10 PaPI = 13.5  CPX 09/17/16 Resting HR: 86 Peak HR: 150  (85% age predicted max HR) BP rest: 96/80 BP peak: 118/72 Peak VO2: 20.5 (53% predicted peak VO2) Ve/VCO2 slope: 35 OUES: 1.38 Peak RER: 1.04 Ventilatory Threshold: 15.7 (41% predicted or measured peak VO2) Peak RR 32 Peak Ventilation: 47.5 VE/MVV: 50% PETCO2 at peak: 30 O2pulse: 8  (62% predicted O2pulse)  CPX 5/15 Resting HR: 89 Peak HR: 138  (  77% age predicted max HR) BP rest: 106/68 BP peak: 126/74 (IPE) Peak VO2: 18.5 (46.5% predicted peak VO2) VE/VCO2 slope: 32.1 OUES: 1.33 Peak RER: 1.06 Ventilatory Threshold: 13.5 (33.9% predicted peak VO2) Peak RR 31 Peak Ventilation: 37.1 VE/MVV: 62.7% PETCO2 at peak: 33 O2pulse: 7  (58% predicted O2pulse)  SH: Lives with his son 82 year old . Unemployed. Disability approved 3 years ago. Does not drink alcohol. Dips tobacco.   FH: Grandfather MI  ROS: All systems negative except as listed in HPI, PMH and Problem List.  Past Medical History:  Diagnosis Date  . AICD (automatic cardioverter/defibrillator) present   . Arthritis   . Cardiomyopathy, nonischemic (HCC)    takes Digoxin daily and Carvedilol daily  . CHF (congestive heart failure) (HCC)     takes Lasix daily as well Aldactone  . Chronic back pain   . Chronic systolic heart failure ([place])   . Cirrhosis ([place])   . Depression    takes Prozac daily  . GERD (gastroesophageal reflux disease)    takes Omeprazole daily  . Hx of Alcohol consumption heavy    rare beer currently  . hx of Tobacco abuse    quit smoking 2014  . Hyperlipidemia    was on Simvastatin but has been off a year  . Insomnia    takes Ambien as needed(just got script yesterday)  . Orthostatic hypotension   . Scoliosis   . 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 60 (XRT and chemo)    Current Outpatient Medications  Medication Sig Dispense Refill  . aspirin 81 MG tablet Take 81 mg by mouth daily.      . Blood Glucose Monitoring Suppl (BLOOD GLUCOSE SYSTEM PAK) KIT Please dispense based on patient and insurance preference. Use to monitor FSBS 2-3x weekly. Dx: E11.9. 1 each 1  . carvedilol (COREG) 6.25 MG tablet TAKE 1 TABLET BY MOUTH TWICE DAILY WITH A MEAL 60 tablet 5  . cetirizine (ZYRTEC) 10 MG tablet Take 1 tablet (10 mg total) by mouth daily. 30 tablet 11  . cyclobenzaprine (FLEXERIL) 10 MG tablet Take 1 tablet (10 mg total) by mouth 3 (three) times daily as needed for muscle spasms. 10 tablet 0  . fluticasone (FLONASE) 50 MCG/ACT nasal spray Place 2 sprays into both nostrils daily. 16 g 6  . Glucose Blood (BLOOD GLUCOSE TEST STRIPS) STRP Please dispense based on patient and insurance preference. Use to monitor FSBS 2-3x weekly. Dx: E11.9. 50 each 1  . Lancet Devices MISC Please dispense based on patient and insurance preference. Use to monitor FSBS 2-3x weekly. Dx: E11.9. 1 each 1  . Lancets MISC Please dispense based on patient and insurance preference. Use to monitor FSBS 2-3x weekly. Dx: E11.9. 50 each 1  . metolazone (ZAROXOLYN) 2.5 MG tablet Take 1 tablet (2.5 mg total) by mouth as directed. By the heart failure clinic 15 tablet 0  . Multiple Vitamin (MULTIVITAMIN WITH MINERALS) TABS tablet Take 1 tablet by  mouth daily.    Marland Kitchen oxyCODONE (OXY IR/ROXICODONE) 5 MG immediate release tablet Take 1 tablet (5 mg total) by mouth 3 (three) times daily as needed for severe pain. 60 tablet 0  . potassium chloride SA (KLOR-CON) 20 MEQ tablet Take 1 tablet (20 mEq total) by mouth as directed. Take1 tab with Metalazone 90 tablet 3  . sacubitril-valsartan (ENTRESTO) 49-51 MG Take 1 tablet by mouth 2 (two) times daily. 180 tablet 3  . spironolactone (ALDACTONE) 25 MG tablet Take 1 tablet (  25 mg total) by mouth daily. 90 tablet 3  . torsemide (DEMADEX) 20 MG tablet Take 2 tablets (40 mg total) by mouth daily. 180 tablet 3   No current facility-administered medications for this encounter.      PHYSICAL EXAM: Vitals:   04/23/19 1006  BP: 96/76  Pulse: 86  SpO2: 98%  Weight: 66.2 kg (146 lb)   Filed Weights   04/23/19 1006  Weight: 66.2 kg (146 lb)   General:  Well appearing. No resp difficulty HEENT: normal Neck: supple. JVP 7 +prominent CV waves. Carotids 2+ bilat; no bruits. No lymphadenopathy or thryomegaly appreciated. Cor: PMI nondisplaced. Regular rate & rhythm. 2/6 TR and Richard Lungs: clear Abdomen: soft, nontender, nondistended. No hepatosplenomegaly. No bruits or masses. Good bowel sounds. Extremities: no cyanosis, clubbing, rash, edema Neuro: alert & orientedx3, cranial nerves grossly intact. moves all 4 extremities w/o difficulty. Affect pleasant    ASSESSMENT & PLAN: 1. Chronic Systolic HF:  Nonischemic cardiomyopathy, ECHO 03/2014 EF 10%. Echo 2/18 EF 15% RV mild HK  [employer] ICD.   - Echo 6/20 EF 15% RV normal severe TR - CPX test 8/20 with significant decline form previous.  - Cath 9/18: normal cors. Relatively well-compensated hemodynmaics - W/u has revealed evidence of cirrhosis which I suspect is due to a combination of ETOH and RV failure/severe TR. Synthetic function currently seems ok (Albumin 4.2  INR 1.3 on 9/20) - I doubt TV repair will be sufficient for him so question  will be if he will still be a transplant candidate. GI has seen and feels liver disease likely not severe enough to be a barrier to transplant.  May need [facility] team to weigh in as well - He has f/u with Dr. Mosetta Pigeon in December to discuss. I will d/w him prior - Symptomatically stable NYHA II - Volume status improved with torsemide 20 bid - Continue entresto to 49/51. Unable to tolerate 97/103 - Failed titration on carvedilol to 3.125/6.25. No b back on 3.125 bid - Continue spiro 25 daily  - Add Farxiga 10 - Unable to tolerate digoxin due to heartburn - blood type A-pos  2. Severe TR - Echo with moderate to severe TR but TV seems structurally intact. ? If related to ICD wire or just dilated annulus.  - I doubt TV repair will be sufficient for him so question will be if he will still be a transplant candidate. He has f/u with Dr. Mosetta Pigeon in December to discuss  3. Cirrhosis - w/u as above - appreciate GI input   Glori Bickers, MD  10:19 AM

## 2019-04-28 ENCOUNTER — Other Ambulatory Visit: Payer: Self-pay | Admitting: *Deleted

## 2019-04-28 MED ORDER — OXYCODONE HCL 5 MG PO TABS: 5.0000 mg | ORAL_TABLET | Freq: Three times a day (TID) | ORAL | 0 refills | Status: DC | PRN

## 2019-04-28 NOTE — Telephone Encounter (Signed)
Received call from patient.   Requested refill on Oxycodone.   Ok to refill??  Last office visit/ refill 03/30/2019.

## 2019-05-28 ENCOUNTER — Other Ambulatory Visit: Payer: Self-pay | Admitting: *Deleted

## 2019-05-28 NOTE — Telephone Encounter (Signed)
Received call from patient.   Requested refill on Oxycodone.   Ok to refill??  Last office visit 03/30/2019.  Last refill 04/28/2019.

## 2019-05-29 MED ORDER — OXYCODONE HCL 5 MG PO TABS: 5.0000 mg | ORAL_TABLET | Freq: Three times a day (TID) | ORAL | 0 refills | Status: DC | PRN

## 2019-06-21 ENCOUNTER — Other Ambulatory Visit (HOSPITAL_COMMUNITY): Payer: Self-pay | Admitting: Internal Medicine

## 2019-06-26 ENCOUNTER — Other Ambulatory Visit: Payer: Self-pay

## 2019-06-26 MED ORDER — OXYCODONE HCL 5 MG PO TABS: 5.0000 mg | ORAL_TABLET | Freq: Three times a day (TID) | ORAL | 0 refills | Status: DC | PRN

## 2019-06-26 NOTE — Telephone Encounter (Signed)
Requested Prescriptions   Pending Prescriptions Disp Refills  . oxyCODONE (OXY IR/ROXICODONE) 5 MG immediate release tablet 60 tablet 0    Sig: Take 1 tablet (5 mg total) by mouth 3 (three) times daily as needed for severe pain.    Last OV 03/30/2019   Last written 05/29/2019

## 2019-07-27 ENCOUNTER — Encounter (HOSPITAL_COMMUNITY): Payer: Medicare HMO | Admitting: Internal Medicine

## 2019-07-29 ENCOUNTER — Other Ambulatory Visit: Payer: Self-pay | Admitting: *Deleted

## 2019-07-29 MED ORDER — OXYCODONE HCL 5 MG PO TABS: 5.0000 mg | ORAL_TABLET | Freq: Three times a day (TID) | ORAL | 0 refills | Status: DC | PRN

## 2019-07-29 NOTE — Telephone Encounter (Signed)
Received call from patient.   Requested refill on Oxycodone.  Ok to refill??  Last office visit 03/30/2019.  Last refill 06/26/2019.

## 2019-07-30 ENCOUNTER — Other Ambulatory Visit (HOSPITAL_COMMUNITY): Payer: Self-pay | Admitting: Internal Medicine

## 2019-07-31 ENCOUNTER — Ambulatory Visit: Payer: Medicaid Other | Admitting: Family Medicine

## 2019-08-06 ENCOUNTER — Other Ambulatory Visit (HOSPITAL_COMMUNITY): Payer: Self-pay | Admitting: Internal Medicine

## 2019-08-07 ENCOUNTER — Ambulatory Visit (INDEPENDENT_AMBULATORY_CARE_PROVIDER_SITE_OTHER): Payer: Medicare HMO | Admitting: Family Medicine

## 2019-08-07 ENCOUNTER — Other Ambulatory Visit: Payer: Self-pay

## 2019-08-07 ENCOUNTER — Encounter: Payer: Self-pay | Admitting: Family Medicine

## 2019-08-07 VITALS — BP 128/74 | HR 100 | Temp 98.5°F | Resp 14 | Ht 69.0 in | Wt 149.0 lb

## 2019-08-07 DIAGNOSIS — G894 Chronic pain syndrome: Secondary | ICD-10-CM | POA: Diagnosis not present

## 2019-08-07 DIAGNOSIS — K7460 Unspecified cirrhosis of liver: Secondary | ICD-10-CM | POA: Diagnosis not present

## 2019-08-07 DIAGNOSIS — R7302 Impaired glucose tolerance (oral): Secondary | ICD-10-CM

## 2019-08-07 DIAGNOSIS — E785 Hyperlipidemia, unspecified: Secondary | ICD-10-CM

## 2019-08-07 DIAGNOSIS — I509 Heart failure, unspecified: Secondary | ICD-10-CM

## 2019-08-07 NOTE — Patient Instructions (Signed)
F/U 4 months  

## 2019-08-07 NOTE — Progress Notes (Signed)
   Subjective:    Patient ID: [name], male    DOB: 07/11/1970, 49 y.o.   MRN: [id number]  Patient presents for Follow-up (is fasting)   Borderline DM- last A1C 6.3% -farxiga 10mg  once a a day  Started by cardiology in setting of his CHF and hear disease  CHF- on entresto, demadex, has metaolzone on hand but doesn't take daily , no fluid overload or SOB/CHEST PAIN  Cirrhosis of liver, has remained off ETOH   Hyperlipidemia- trying to management with diet, due to liver disease no STATIN drug   Chronic pain maintained on oxycodone prn      No new concerns today   Review Of Systems:  GEN- denies fatigue, fever, weight loss,weakness, recent illness HEENT- denies eye drainage, change in vision, nasal discharge, CVS- denies chest pain, palpitations RESP- denies SOB, cough, wheeze ABD- denies N/V, change in stools, abd pain GU- denies dysuria, hematuria, dribbling, incontinence MSK- +joint pain, muscle aches, injury Neuro- denies headache, dizziness, syncope, seizure activity       Objective:    BP 128/74   Pulse 100   Temp 98.5 F (36.9 C) (Temporal)   Resp 14   Ht 5\' 9"  (1.753 m)   Wt 149 lb (67.6 kg)   SpO2 97%   BMI 22.00 kg/m  GEN- NAD, alert and oriented x3 HEENT- PERRL, EOMI, non injected sclera, pink conjunctiva, Neck- Supple, no thyromegaly CVS- RRR, no murmur RESP-CTAB ABD-NABS,soft,NT,ND EXT- No edema Pulses- Radial 2+        Assessment & Plan:      Problem List Items Addressed This Visit      Unprioritized   Chronic CHF (congestive heart failure) ([place]) - Primary    Currently compensated, follows weight and BP Reviewed cardiology note       Relevant Orders   CBC with Differential/Platelet (Completed)   Comprehensive metabolic panel (Completed)   Hemoglobin A1c (Completed)   Lipid panel (Completed)   Chronic pain syndrome    Continue oxycodone      Cirrhosis of liver (HCC)   Glucose intolerance (impaired glucose tolerance)     Discussed watching carbs/sweets On Farxiga, check A1C      Relevant Orders   Hemoglobin A1c (Completed)   Hyperlipidemia    Diet ,due to liver cirrhosis         Note: This dictation was prepared with Dragon dictation along with smaller phrase technology. Any transcriptional errors that result from this process are unintentional.

## 2019-08-08 LAB — CBC WITH DIFFERENTIAL/PLATELET
Absolute Monocytes: 757 cells/uL (ref 200–950)
Basophils Absolute: 40 cells/uL (ref 0–200)
Basophils Relative: 0.9 %
Eosinophils Absolute: 70 cells/uL (ref 15–500)
Eosinophils Relative: 1.6 %
HCT: 44.4 % (ref 38.5–50.0)
Hemoglobin: 15.3 g/dL (ref 13.2–17.1)
Lymphs Abs: 585 cells/uL — ABNORMAL LOW (ref 850–3900)
MCH: 32.6 pg (ref 27.0–33.0)
MCHC: 34.5 g/dL (ref 32.0–36.0)
MCV: 94.7 fL (ref 80.0–100.0)
MPV: 10.9 fL (ref 7.5–12.5)
Monocytes Relative: 17.2 %
Neutro Abs: 2948 cells/uL (ref 1500–7800)
Neutrophils Relative %: 67 %
Platelets: 256 10*3/uL (ref 140–400)
RBC: 4.69 10*6/uL (ref 4.20–5.80)
RDW: 14.2 % (ref 11.0–15.0)
Total Lymphocyte: 13.3 %
WBC: 4.4 10*3/uL (ref 3.8–10.8)

## 2019-08-08 LAB — COMPREHENSIVE METABOLIC PANEL
AG Ratio: 1.3 (calc) (ref 1.0–2.5)
ALT: 36 U/L (ref 9–46)
AST: 42 U/L — ABNORMAL HIGH (ref 10–40)
Albumin: 4.3 g/dL (ref 3.6–5.1)
Alkaline phosphatase (APISO): 150 U/L — ABNORMAL HIGH (ref 36–130)
BUN: 20 mg/dL (ref 7–25)
CO2: 30 mmol/L (ref 20–32)
Calcium: 9.9 mg/dL (ref 8.6–10.3)
Chloride: 96 mmol/L — ABNORMAL LOW (ref 98–110)
Creat: 1.26 mg/dL (ref 0.60–1.35)
Globulin: 3.2 g/dL (calc) (ref 1.9–3.7)
Glucose, Bld: 143 mg/dL — ABNORMAL HIGH (ref 65–99)
Potassium: 4.1 mmol/L (ref 3.5–5.3)
Sodium: 138 mmol/L (ref 135–146)
Total Bilirubin: 0.8 mg/dL (ref 0.2–1.2)
Total Protein: 7.5 g/dL (ref 6.1–8.1)

## 2019-08-08 LAB — LIPID PANEL
Cholesterol: 262 mg/dL — ABNORMAL HIGH (ref <200)
HDL: 87 mg/dL (ref >=40)
LDL Cholesterol (Calc): 157 mg/dL (calc) — ABNORMAL HIGH
Non-HDL Cholesterol (Calc): 175 mg/dL (calc) — ABNORMAL HIGH (ref <130)
Total CHOL/HDL Ratio: 3 (calc) (ref <5.0)
Triglycerides: 80 mg/dL (ref <150)

## 2019-08-08 LAB — HEMOGLOBIN A1C
Hgb A1c MFr Bld: 6.7 % of total Hgb — ABNORMAL HIGH (ref <5.7)
Mean Plasma Glucose: 146 (calc)
eAG (mmol/L): 8.1 (calc)

## 2019-08-09 ENCOUNTER — Encounter: Payer: Self-pay | Admitting: Family Medicine

## 2019-08-09 NOTE — Assessment & Plan Note (Signed)
Diet ,due to liver cirrhosis

## 2019-08-09 NOTE — Assessment & Plan Note (Signed)
Discussed watching carbs/sweets On Farxiga, check A1C

## 2019-08-09 NOTE — Assessment & Plan Note (Signed)
Currently compensated, follows weight and BP Reviewed cardiology note

## 2019-08-09 NOTE — Assessment & Plan Note (Signed)
Continue oxycodone 

## 2019-08-12 ENCOUNTER — Encounter: Payer: Self-pay | Admitting: *Deleted

## 2019-08-12 ENCOUNTER — Other Ambulatory Visit: Payer: Self-pay | Admitting: *Deleted

## 2019-08-12 DIAGNOSIS — E119 Type 2 diabetes mellitus without complications: Secondary | ICD-10-CM | POA: Insufficient documentation

## 2019-08-12 DIAGNOSIS — E1169 Type 2 diabetes mellitus with other specified complication: Secondary | ICD-10-CM

## 2019-08-18 ENCOUNTER — Ambulatory Visit: Payer: Medicare HMO | Admitting: Nutrition

## 2019-08-26 ENCOUNTER — Other Ambulatory Visit: Payer: Self-pay | Admitting: *Deleted

## 2019-08-26 DIAGNOSIS — Z23 Encounter for immunization: Secondary | ICD-10-CM | POA: Diagnosis not present

## 2019-08-26 MED ORDER — OXYCODONE HCL 5 MG PO TABS: 5.0000 mg | ORAL_TABLET | Freq: Three times a day (TID) | ORAL | 0 refills | Status: DC | PRN

## 2019-08-26 NOTE — Telephone Encounter (Signed)
Received call from patient.   Requested refill on Oxycodone.   Ok to refill??  Last office visit 08/07/2019.  Last refill 07/29/2019.

## 2019-09-04 ENCOUNTER — Other Ambulatory Visit (HOSPITAL_COMMUNITY)
Admission: RE | Admit: 2019-09-04 | Discharge: 2019-09-04 | Disposition: A | Discharge status: Home / Self Care | Payer: Medicare HMO | Source: Ambulatory Visit | Attending: Internal Medicine | Admitting: Internal Medicine

## 2019-09-04 ENCOUNTER — Other Ambulatory Visit: Payer: Self-pay

## 2019-09-04 ENCOUNTER — Ambulatory Visit (INDEPENDENT_AMBULATORY_CARE_PROVIDER_SITE_OTHER): Payer: Medicare HMO | Admitting: *Deleted

## 2019-09-04 ENCOUNTER — Encounter (HOSPITAL_COMMUNITY): Payer: Self-pay | Admitting: Internal Medicine

## 2019-09-04 ENCOUNTER — Other Ambulatory Visit (HOSPITAL_COMMUNITY): Payer: Self-pay

## 2019-09-04 ENCOUNTER — Ambulatory Visit (HOSPITAL_COMMUNITY)
Admission: RE | Admit: 2019-09-04 | Discharge: 2019-09-04 | Disposition: A | Discharge status: Home / Self Care | Payer: Medicare HMO | Source: Ambulatory Visit | Attending: Internal Medicine | Admitting: Internal Medicine

## 2019-09-04 VITALS — BP 102/76 | HR 92 | Wt 151.8 lb

## 2019-09-04 DIAGNOSIS — Z87891 Personal history of nicotine dependence: Secondary | ICD-10-CM | POA: Insufficient documentation

## 2019-09-04 DIAGNOSIS — Z01812 Encounter for preprocedural laboratory examination: Secondary | ICD-10-CM | POA: Insufficient documentation

## 2019-09-04 DIAGNOSIS — I5022 Chronic systolic (congestive) heart failure: Secondary | ICD-10-CM | POA: Diagnosis not present

## 2019-09-04 DIAGNOSIS — Z7984 Long term (current) use of oral hypoglycemic drugs: Secondary | ICD-10-CM | POA: Insufficient documentation

## 2019-09-04 DIAGNOSIS — E785 Hyperlipidemia, unspecified: Secondary | ICD-10-CM | POA: Insufficient documentation

## 2019-09-04 DIAGNOSIS — K7460 Unspecified cirrhosis of liver: Secondary | ICD-10-CM | POA: Insufficient documentation

## 2019-09-04 DIAGNOSIS — Z20822 Contact with and (suspected) exposure to covid-19: Secondary | ICD-10-CM | POA: Insufficient documentation

## 2019-09-04 DIAGNOSIS — I071 Rheumatic tricuspid insufficiency: Secondary | ICD-10-CM

## 2019-09-04 DIAGNOSIS — M1990 Unspecified osteoarthritis, unspecified site: Secondary | ICD-10-CM | POA: Diagnosis not present

## 2019-09-04 DIAGNOSIS — J90 Pleural effusion, not elsewhere classified: Secondary | ICD-10-CM | POA: Diagnosis not present

## 2019-09-04 DIAGNOSIS — M419 Scoliosis, unspecified: Secondary | ICD-10-CM | POA: Diagnosis not present

## 2019-09-04 DIAGNOSIS — Z95810 Presence of automatic (implantable) cardiac defibrillator: Secondary | ICD-10-CM | POA: Diagnosis not present

## 2019-09-04 DIAGNOSIS — K7469 Other cirrhosis of liver: Secondary | ICD-10-CM

## 2019-09-04 DIAGNOSIS — Z7982 Long term (current) use of aspirin: Secondary | ICD-10-CM | POA: Diagnosis not present

## 2019-09-04 DIAGNOSIS — I428 Other cardiomyopathies: Secondary | ICD-10-CM | POA: Insufficient documentation

## 2019-09-04 LAB — CBC
HCT: 45 % (ref 39.0–52.0)
Hemoglobin: 14.8 g/dL (ref 13.0–17.0)
MCH: 31.9 pg (ref 26.0–34.0)
MCHC: 32.9 g/dL (ref 30.0–36.0)
MCV: 97 fL (ref 80.0–100.0)
Platelets: 255 10*3/uL (ref 150–400)
RBC: 4.64 MIL/uL (ref 4.22–5.81)
RDW: 14.4 % (ref 11.5–15.5)
WBC: 4.4 10*3/uL (ref 4.0–10.5)
nRBC: 0 % (ref 0.0–0.2)

## 2019-09-04 LAB — CUP PACEART REMOTE DEVICE CHECK
Battery Remaining Longevity: 66 mo
Battery Remaining Percentage: 72 %
Brady Statistic RV Percent Paced: 0 %
Date Time Interrogation Session: 20210326092900
HighPow Impedance: 95 Ohm
Implantable Lead Implant Date: 20121214
Implantable Lead Location: 753860
Implantable Lead Model: 180
Implantable Lead Serial Number: 307189
Implantable Pulse Generator Implant Date: 20121214
Lead Channel Impedance Value: 612 Ohm
Lead Channel Pacing Threshold Amplitude: 0.7 V
Lead Channel Pacing Threshold Pulse Width: 0.4 ms
Lead Channel Setting Pacing Amplitude: 2.4 V
Lead Channel Setting Pacing Pulse Width: 0.4 ms
Lead Channel Setting Sensing Sensitivity: 0.4 mV
Pulse Gen Serial Number: 118994

## 2019-09-04 LAB — BASIC METABOLIC PANEL
Anion gap: 12 (ref 5–15)
BUN: 22 mg/dL — ABNORMAL HIGH (ref 6–20)
CO2: 31 mmol/L (ref 22–32)
Calcium: 9.3 mg/dL (ref 8.9–10.3)
Chloride: 94 mmol/L — ABNORMAL LOW (ref 98–111)
Creatinine, Ser: 1.3 mg/dL — ABNORMAL HIGH (ref 0.61–1.24)
GFR calc Af Amer: >60 mL/min (ref >60)
GFR calc non Af Amer: >60 mL/min (ref >60)
Glucose, Bld: 131 mg/dL — ABNORMAL HIGH (ref 70–99)
Potassium: 3.4 mmol/L — ABNORMAL LOW (ref 3.5–5.1)
Sodium: 137 mmol/L (ref 135–145)

## 2019-09-04 LAB — SARS CORONAVIRUS 2 (TAT 6-24 HRS): SARS Coronavirus 2: NEGATIVE

## 2019-09-04 MED ORDER — SODIUM CHLORIDE 0.9% FLUSH: 3.0000 mL | Freq: Two times a day (BID) | INTRAVENOUS | Status: DC

## 2019-09-04 NOTE — Addendum Note (Signed)
Encounter addended by: Valeda Malm, RN on: 09/04/2019 10:29 AM  Actions taken: Order list changed, Diagnosis association updated, Charge Capture section accepted, Clinical Note Signed

## 2019-09-04 NOTE — H&P (View-Only) (Signed)
Patient ID: [name], male   DOB: 12-16-1970, 49 y.o.   MRN: [id number]    Primary Cardiologist: Dr Domenic Polite HF MD: Dr Haroldine Laws [name]: Dr Mosetta Pigeon  HPI: [name] is 49 year old with history of NICM (diagnosed 10/2010 - norm cors 2012 and 1/11), chronic systolic heart failure EF 15% (08/2013), S/P ICD [employer], smoker, Wilms tumor s/p nephrectomy at age 32 had chemoradiation, scoliosis.    We saw him at the end of March 2015 and had large recurrent R pleural effusion. Referred to Dr. Prescott Gum who placed Pleurex catheter on 4/10, removed on 09/28/13.   CPX (5/15) with RER 1.06, VO2 max 18.5, VE/VCO2 slope 32.1.  Moderate to severe functional limitation, circulatory.   CPX 09/17/16 Peak VO2: 20.5 (53% predicted peak VO2) Slope 35 Underwent L/RHC on 02/12/17. Had normal coronaries and relatively well-compensated hemodynamics. EF 20%.  Earlier this year we increased Entresto to 21/103.  Felt lightheaded and had stomach discomfort so he cut back.   Has been evaluated at [facility] by Dr Mosetta Pigeon for possible transplant. Seen in 8/20 and concern was for possible cirrhosis given progressive ascites. .   CPX 8/20 with pVO2 17.1 (down from 20.5)  Slope 35 (stable).   Paracentesis 03/02/19: 3.7 L off   Seen by Dr. Havery Moros in GI. Ab CT 03/23/19 confirmed cirrhosis.   Here for routine f/u. Continues to feel very well. Got COVID shot last week and had some belly swelling after. Otherwise feel fine. Doing whatever he wants to do cuts grass, cuts wood, etc. Gets SOB when he picks up something heavy. [facility] appt has been rescheduled.   Albumin 4.2  INR 1.3 on 9/20  Echo 6/20 EF 15% RV mild HK with severe TR Personally reviewed  Echo 01/29/19   RA = 17 RV = 46/18 PA = 51/22 (33) PCW = 28 Fick cardiac output/index =4.0/2.2 PVR =1.0 WU Ao sat = 99% PA sat = 62%, 62% RA/PCWP = 0.61 PaPI = 1.71  CPX 01/15/19   FVC 2.85 (57%)    FEV1 2.04 (51%)     FEV1/FVC 72 (89%)     MVV 79 (50%)     Resting HR: 96 Peak HR: 157  (90% age predicted max HR)  BP rest: 92/64 BP peak: 100/58  Peak VO2: 17.7 (47% predicted peak VO2)  VE/VCO2 slope: 35  OUES: 1.30  Peak RER: 1.18  Ventilatory Threshold: 14.0 (37% predicted or measured peak VO2 and 79% measured PVO2)  VE/MVV: 58%  O2pulse: 8  (57% predicted O2pulse)    ECHO 04/2014 EF 10% RV mildly dilated.   ECHO 2/18 EF 15% Mild Richard RV mild HK. Severe TR. Triv Richard . RV ok Echo 11/2016  EF 15% RV ok. Moderate to severe TR  Cath 02/12/17:  Normal cors  Ao = 91/60 (74) LV = 102/18 RA =  2 RV = 48/5 PA = 48/21 (31) PCW = 20 Fick cardiac output/index = 4.0/2.3 PVR = 2.8 WU Ao sat = 95% PA sat = 68%, 70% RA/PCWP = 0.10 PaPI = 13.5  CPX 09/17/16 Resting HR: 86 Peak HR: 150  (85% age predicted max HR) BP rest: 96/80 BP peak: 118/72 Peak VO2: 20.5 (53% predicted peak VO2) Ve/VCO2 slope: 35 OUES: 1.38 Peak RER: 1.04 Ventilatory Threshold: 15.7 (41% predicted or measured peak VO2) Peak RR 32 Peak Ventilation: 47.5 VE/MVV: 50% PETCO2 at peak: 30 O2pulse: 8  (62% predicted O2pulse)  CPX 5/15 Resting HR: 89 Peak HR: 138  (  77% age predicted max HR) BP rest: 106/68 BP peak: 126/74 (IPE) Peak VO2: 18.5 (46.5% predicted peak VO2) VE/VCO2 slope: 32.1 OUES: 1.33 Peak RER: 1.06 Ventilatory Threshold: 13.5 (33.9% predicted peak VO2) Peak RR 31 Peak Ventilation: 37.1 VE/MVV: 62.7% PETCO2 at peak: 33 O2pulse: 7  (58% predicted O2pulse)  SH: Lives with his son 28 year old . Unemployed. Disability approved 3 years ago. Does not drink alcohol. Dips tobacco.   FH: Grandfather MI  ROS: All systems negative except as listed in HPI, PMH and Problem List.  Past Medical History:  Diagnosis Date  . AICD (automatic cardioverter/defibrillator) present   . Arthritis   . Cardiomyopathy, nonischemic (HCC)    takes Digoxin daily and Carvedilol daily  . CHF (congestive heart failure) (HCC)    takes Lasix daily as  well Aldactone  . Chronic back pain   . Chronic systolic heart failure ([place])   . Cirrhosis ([place])   . Depression    takes Prozac daily  . GERD (gastroesophageal reflux disease)    takes Omeprazole daily  . Hx of Alcohol consumption heavy    rare beer currently  . hx of Tobacco abuse    quit smoking 2014  . Hyperlipidemia    was on Simvastatin but has been off a year  . Insomnia    takes Ambien as needed(just got script yesterday)  . Orthostatic hypotension   . Scoliosis   . 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 76 (XRT and chemo)    Current Outpatient Medications  Medication Sig Dispense Refill  . aspirin 81 MG tablet Take 81 mg by mouth daily.      . Blood Glucose Monitoring Suppl (BLOOD GLUCOSE SYSTEM PAK) KIT Please dispense based on patient and insurance preference. Use to monitor FSBS 2-3x weekly. Dx: E11.9. 1 each 1  . carvedilol (COREG) 3.125 MG tablet TAKE 1 TABLET BY MOUTH TWICE DAILY WITH A MEAL 180 tablet 0  . cetirizine (ZYRTEC) 10 MG tablet Take 1 tablet (10 mg total) by mouth daily. 30 tablet 11  . cyclobenzaprine (FLEXERIL) 10 MG tablet Take 1 tablet (10 mg total) by mouth 3 (three) times daily as needed for muscle spasms. 10 tablet 0  . dapagliflozin propanediol (FARXIGA) 10 MG TABS tablet Take 10 mg by mouth daily before breakfast. 30 tablet 6  . fluticasone (FLONASE) 50 MCG/ACT nasal spray Place 2 sprays into both nostrils daily. 16 g 6  . Glucose Blood (BLOOD GLUCOSE TEST STRIPS) STRP Please dispense based on patient and insurance preference. Use to monitor FSBS 2-3x weekly. Dx: E11.9. 50 each 1  . Lancet Devices MISC Please dispense based on patient and insurance preference. Use to monitor FSBS 2-3x weekly. Dx: E11.9. 1 each 1  . Lancets MISC Please dispense based on patient and insurance preference. Use to monitor FSBS 2-3x weekly. Dx: E11.9. 50 each 1  . Multiple Vitamin (MULTIVITAMIN WITH MINERALS) TABS tablet Take 1 tablet by mouth daily.    Marland Kitchen oxyCODONE (OXY  IR/ROXICODONE) 5 MG immediate release tablet Take 1 tablet (5 mg total) by mouth 3 (three) times daily as needed for severe pain. 60 tablet 0  . sacubitril-valsartan (ENTRESTO) 49-51 MG Take 1 tablet by mouth 2 (two) times daily. 180 tablet 3  . spironolactone (ALDACTONE) 25 MG tablet Take 1 tablet (25 mg total) by mouth daily. 90 tablet 3  . torsemide (DEMADEX) 20 MG tablet Take 2 tablets (40 mg total) by mouth daily. 180 tablet 3  . metolazone (  ZAROXOLYN) 2.5 MG tablet TAKE 1 TABLET BY MOUTH AS DIRECTED BY  HEART  FAILURE  CLINIC (Patient not taking: Reported on 09/04/2019) 15 tablet 0  . potassium chloride SA (KLOR-CON) 20 MEQ tablet Take 1 tablet (20 mEq total) by mouth as directed. Take1 tab with Metalazone (Patient not taking: Reported on 09/04/2019) 90 tablet 3   No current facility-administered medications for this encounter.     PHYSICAL EXAM: Vitals:   09/04/19 0930  BP: 102/76  Pulse: 92  SpO2: 98%  Weight: 68.9 kg (151 lb 12.8 oz)   Filed Weights   09/04/19 0930  Weight: 68.9 kg (151 lb 12.8 oz)   General:  Well appearing. No resp difficulty HEENT: normal Neck: supple. no JVD. Carotids 2+ bilat; no bruits. No lymphadenopathy or thryomegaly appreciated. Cor: PMI nondisplaced. Regular rate & rhythm. No rubs, gallops or murmurs. 2/6 TR Lungs: clear Abdomen: soft, nontender, nondistended. No hepatosplenomegaly. No bruits or masses. Good bowel sounds. Extremities: no cyanosis, clubbing, rash, edema Neuro: alert & orientedx3, cranial nerves grossly intact. moves all 4 extremities w/o difficulty. Affect pleasant    ASSESSMENT & PLAN: 1. Chronic Systolic HF:  Nonischemic cardiomyopathy, ECHO 03/2014 EF 10%. Echo 2/18 EF 15% RV mild HK  [employer] ICD.   - Echo 6/20 EF 15% RV normal severe TR - Remains NYHA II. Volume status ok  - CPX test 8/20 with significant decline from previous.  - Cath 9/18: normal cors. Relatively well-compensated hemodynmaics - W/u has revealed  evidence of cirrhosis which I suspect is due to a combination of ETOH and RV failure/severe TR. Synthetic function currently seems ok (Albumin 4.2  INR 1.3 on 9/20) - I doubt TV repair will be sufficient for him so question will be if he will still be a transplant candidate. GI has seen and feels liver disease likely not severe enough to be a barrier to transplant.  May need [facility] team to weigh in as well - Has follow with [facility] next month - Symptomatically stable NYHA II - Volume status improved with torsemide 20 bid - Continue entresto to 49/51. Unable to tolerate 97/103 - On carvedilol 3.125 bid Failed titration on carvedilol to 3.125/6.25.  - Continue spiro 25 daily  - Farxiga 10 - Unable to tolerate digoxin due to heartburn - Recent labs stable - I discussed with Dr. Mosetta Pigeon at [facility] will do [place] with hepatic wedge prior to visit - blood type A-pos  2. Severe TR - Echo with moderate to severe TR but TV seems structurally intact. ? If related to ICD wire or just dilated annulus.  - I doubt TV repair will be sufficient for him so question will be if he will still be a transplant candidate.  - nochange  3. Cirrhosis - w/u as above - appreciate GI input - will do hepatic wedge with RHC   Glori Bickers, MD  10:03 AM

## 2019-09-04 NOTE — Progress Notes (Signed)
Patient ID: [name], male   DOB: [birth year], 49 y.o.   MRN: [id number]    Primary Cardiologist: Dr [name] HF MD: Dr [name] [name]: Dr [name]  HPI: [name] is 49 year old with history of NICM (diagnosed 10/2010 - norm cors 2012 and 0/45), chronic systolic heart failure EF 15% (08/2013), S/P ICD [employer], smoker, Wilms tumor s/p nephrectomy at age 48 had chemoradiation, scoliosis.    We saw him at the end of March 2015 and had large recurrent R pleural effusion. Referred to Dr. Prescott [name] who placed Pleurex catheter on 4/10, removed on 09/28/13.   CPX (5/15) with RER 1.06, VO2 max 18.5, VE/VCO2 slope 32.1.  Moderate to severe functional limitation, circulatory.   CPX 09/17/16 Peak VO2: 20.5 (53% predicted peak VO2) Slope 35 Underwent L/RHC on 02/12/17. Had normal coronaries and relatively well-compensated hemodynamics. EF 20%.  Earlier this year we increased Entresto to 37/103.  Felt lightheaded and had stomach discomfort so he cut back.   Has been evaluated at [facility] by Dr [name] for possible transplant. Seen in 8/20 and concern was for possible cirrhosis given progressive ascites. .   CPX 8/20 with pVO2 17.1 (down from 20.5)  Slope 35 (stable).   Paracentesis 03/02/19: 3.7 L off   Seen by Dr. Havery [name] in GI. Ab CT 03/23/19 confirmed cirrhosis.   Here for routine f/u. Continues to feel very well. Got COVID shot last week and had some belly swelling after. Otherwise feel fine. Doing whatever he wants to do cuts grass, cuts wood, etc. Gets SOB when he picks up something heavy. [facility] appt has been rescheduled.   Albumin 4.2  INR 1.3 on 9/20  Echo 6/20 EF 15% RV mild HK with severe TR Personally reviewed  Echo 01/29/19   RA = 17 RV = 46/18 PA = 51/22 (33) PCW = 28 Fick cardiac output/index =4.0/2.2 PVR =1.0 WU Ao sat = 99% PA sat = 62%, 62% RA/PCWP = 0.61 PaPI = 1.71  CPX 01/15/19   FVC 2.85 (57%)    FEV1 2.04 (51%)     FEV1/FVC 72 (89%)     MVV 79 (50%)     Resting HR: 96 Peak HR: 157  (90% age predicted max HR)  BP rest: 92/64 BP peak: 100/58  Peak VO2: 17.7 (47% predicted peak VO2)  VE/VCO2 slope: 35  OUES: 1.30  Peak RER: 1.18  Ventilatory Threshold: 14.0 (37% predicted or measured peak VO2 and 79% measured PVO2)  VE/MVV: 58%  O2pulse: 8  (57% predicted O2pulse)    ECHO 04/2014 EF 10% RV mildly dilated.   ECHO 2/18 EF 15% Mild Richard RV mild HK. Severe TR. Triv Richard . RV ok Echo 11/2016  EF 15% RV ok. Moderate to severe TR  Cath 02/12/17:  Normal cors  Ao = 91/60 (74) LV = 102/18 RA =  2 RV = 48/5 PA = 48/21 (31) PCW = 20 Fick cardiac output/index = 4.0/2.3 PVR = 2.8 WU Ao sat = 95% PA sat = 68%, 70% RA/PCWP = 0.10 PaPI = 13.5  CPX 09/17/16 Resting HR: 86 Peak HR: 150  (85% age predicted max HR) BP rest: 96/80 BP peak: 118/72 Peak VO2: 20.5 (53% predicted peak VO2) Ve/VCO2 slope: 35 OUES: 1.38 Peak RER: 1.04 Ventilatory Threshold: 15.7 (41% predicted or measured peak VO2) Peak RR 32 Peak Ventilation: 47.5 VE/MVV: 50% PETCO2 at peak: 30 O2pulse: 8  (62% predicted O2pulse)  CPX 5/15 Resting HR: 89 Peak HR: 138  (  77% age predicted max HR) BP rest: 106/68 BP peak: 126/74 (IPE) Peak VO2: 18.5 (46.5% predicted peak VO2) VE/VCO2 slope: 32.1 OUES: 1.33 Peak RER: 1.06 Ventilatory Threshold: 13.5 (33.9% predicted peak VO2) Peak RR 31 Peak Ventilation: 37.1 VE/MVV: 62.7% PETCO2 at peak: 33 O2pulse: 7  (58% predicted O2pulse)  SH: Lives with his son 78 year old . Unemployed. Disability approved 3 years ago. Does not drink alcohol. Dips tobacco.   FH: Grandfather MI  ROS: All systems negative except as listed in HPI, PMH and Problem List.  Past Medical History:  Diagnosis Date  . AICD (automatic cardioverter/defibrillator) present   . Arthritis   . Cardiomyopathy, nonischemic (HCC)    takes Digoxin daily and Carvedilol daily  . CHF (congestive heart failure) (HCC)    takes Lasix daily as  well Aldactone  . Chronic back pain   . Chronic systolic heart failure ([place])   . Cirrhosis ([place])   . Depression    takes Prozac daily  . GERD (gastroesophageal reflux disease)    takes Omeprazole daily  . Hx of Alcohol consumption heavy    rare beer currently  . hx of Tobacco abuse    quit smoking 2014  . Hyperlipidemia    was on Simvastatin but has been off a year  . Insomnia    takes Ambien as needed(just got script yesterday)  . Orthostatic hypotension   . Scoliosis   . 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 56 (XRT and chemo)    Current Outpatient Medications  Medication Sig Dispense Refill  . aspirin 81 MG tablet Take 81 mg by mouth daily.      . Blood Glucose Monitoring Suppl (BLOOD GLUCOSE SYSTEM PAK) KIT Please dispense based on patient and insurance preference. Use to monitor FSBS 2-3x weekly. Dx: E11.9. 1 each 1  . carvedilol (COREG) 3.125 MG tablet TAKE 1 TABLET BY MOUTH TWICE DAILY WITH A MEAL 180 tablet 0  . cetirizine (ZYRTEC) 10 MG tablet Take 1 tablet (10 mg total) by mouth daily. 30 tablet 11  . cyclobenzaprine (FLEXERIL) 10 MG tablet Take 1 tablet (10 mg total) by mouth 3 (three) times daily as needed for muscle spasms. 10 tablet 0  . dapagliflozin propanediol (FARXIGA) 10 MG TABS tablet Take 10 mg by mouth daily before breakfast. 30 tablet 6  . fluticasone (FLONASE) 50 MCG/ACT nasal spray Place 2 sprays into both nostrils daily. 16 g 6  . Glucose Blood (BLOOD GLUCOSE TEST STRIPS) STRP Please dispense based on patient and insurance preference. Use to monitor FSBS 2-3x weekly. Dx: E11.9. 50 each 1  . Lancet Devices MISC Please dispense based on patient and insurance preference. Use to monitor FSBS 2-3x weekly. Dx: E11.9. 1 each 1  . Lancets MISC Please dispense based on patient and insurance preference. Use to monitor FSBS 2-3x weekly. Dx: E11.9. 50 each 1  . Multiple Vitamin (MULTIVITAMIN WITH MINERALS) TABS tablet Take 1 tablet by mouth daily.    Marland Kitchen oxyCODONE (OXY  IR/ROXICODONE) 5 MG immediate release tablet Take 1 tablet (5 mg total) by mouth 3 (three) times daily as needed for severe pain. 60 tablet 0  . sacubitril-valsartan (ENTRESTO) 49-51 MG Take 1 tablet by mouth 2 (two) times daily. 180 tablet 3  . spironolactone (ALDACTONE) 25 MG tablet Take 1 tablet (25 mg total) by mouth daily. 90 tablet 3  . torsemide (DEMADEX) 20 MG tablet Take 2 tablets (40 mg total) by mouth daily. 180 tablet 3  . metolazone (  ZAROXOLYN) 2.5 MG tablet TAKE 1 TABLET BY MOUTH AS DIRECTED BY  HEART  FAILURE  CLINIC (Patient not taking: Reported on 09/04/2019) 15 tablet 0  . potassium chloride SA (KLOR-CON) 20 MEQ tablet Take 1 tablet (20 mEq total) by mouth as directed. Take1 tab with Metalazone (Patient not taking: Reported on 09/04/2019) 90 tablet 3   No current facility-administered medications for this encounter.     PHYSICAL EXAM: Vitals:   09/04/19 0930  BP: 102/76  Pulse: 92  SpO2: 98%  Weight: 68.9 kg (151 lb 12.8 oz)   Filed Weights   09/04/19 0930  Weight: 68.9 kg (151 lb 12.8 oz)   General:  Well appearing. No resp difficulty HEENT: normal Neck: supple. no JVD. Carotids 2+ bilat; no bruits. No lymphadenopathy or thryomegaly appreciated. Cor: PMI nondisplaced. Regular rate & rhythm. No rubs, gallops or murmurs. 2/6 TR Lungs: clear Abdomen: soft, nontender, nondistended. No hepatosplenomegaly. No bruits or masses. Good bowel sounds. Extremities: no cyanosis, clubbing, rash, edema Neuro: alert & orientedx3, cranial nerves grossly intact. moves all 4 extremities w/o difficulty. Affect pleasant    ASSESSMENT & PLAN: 1. Chronic Systolic HF:  Nonischemic cardiomyopathy, ECHO 03/2014 EF 10%. Echo 2/18 EF 15% RV mild HK  [employer] ICD.   - Echo 6/20 EF 15% RV normal severe TR - Remains NYHA II. Volume status ok  - CPX test 8/20 with significant decline from previous.  - Cath 9/18: normal cors. Relatively well-compensated hemodynmaics - W/u has revealed  evidence of cirrhosis which I suspect is due to a combination of ETOH and RV failure/severe TR. Synthetic function currently seems ok (Albumin 4.2  INR 1.3 on 9/20) - I doubt TV repair will be sufficient for him so question will be if he will still be a transplant candidate. GI has seen and feels liver disease likely not severe enough to be a barrier to transplant.  May need [facility] team to weigh in as well - Has follow with [facility] next month - Symptomatically stable NYHA II - Volume status improved with torsemide 20 bid - Continue entresto to 49/51. Unable to tolerate 97/103 - On carvedilol 3.125 bid Failed titration on carvedilol to 3.125/6.25.  - Continue spiro 25 daily  - Farxiga 10 - Unable to tolerate digoxin due to heartburn - Recent labs stable - I discussed with Dr. Mosetta [name] at [facility] will do [place] with hepatic wedge prior to visit - blood type A-pos  2. Severe TR - Echo with moderate to severe TR but TV seems structurally intact. ? If related to ICD wire or just dilated annulus.  - I doubt TV repair will be sufficient for him so question will be if he will still be a transplant candidate.  - nochange  3. Cirrhosis - w/u as above - appreciate GI input - will do hepatic wedge with RHC   Glori Bickers, MD  10:03 AM

## 2019-09-04 NOTE — Patient Instructions (Signed)
No medication changes today!    Labs today We will only contact you if something comes back abnormal or we need to make some changes. Otherwise no news is good news!   Your physician recommends that you schedule a follow-up appointment in: 4 months with Dr Haroldine Laws. We will call you to schedule this appointment.    Please call office at [phone] option 2 if you have any questions or concerns.   At the [facility], you and your health needs are our priority. As part of our continuing mission to provide you with exceptional heart care, we have created designated Provider Care Teams. These Care Teams include your primary Cardiologist (physician) and Advanced Practice Providers (APPs- Physician Assistants and Nurse Practitioners) who all work together to provide you with the care you need, when you need it.   You may see any of the following providers on your designated Care Team at your next follow up: Marland Kitchen Dr Glori Bickers . Dr Loralie Champagne . Darrick Grinder, NP . Lyda Jester, PA . Audry Riles, PharmD   Please be sure to bring in all your medications bottles to every appointment.     [facility] [place] [id number] [id number] Dept: [phone] Loc: [phone]  [name]  09/04/2019  You are scheduled for a Cardiac Catheterization on Tuesday, March 30 with Dr. Glori Bickers.  1. Please arrive at the [facility] (Main Entrance A) at [facility]: [address] [place], [place] [address] at 12:00 PM (This time is two hours before your procedure to ensure your preparation). Free valet parking service is available.   Special note: Every effort is made to have your procedure done on time. Please understand that emergencies sometimes delay scheduled procedures.  2. Diet: Do not eat solid foods after midnight.  The patient may have clear liquids  until 8am upon the day of the procedure.  3. Labs: Done today in office  You will need a pre procedure COVID test    WHEN: Today, March 26th, 2021   12pm WHERE: [facility]       [place] [address]  This is a drive thru testing site, you will remain in your car. Be sure to get in the line FOR PROCEDURES Once you have been swabbed you will need to remain home in quarantine until you return for your procedure.   4. Medication instructions in preparation for your procedure:   Contrast Allergy: No   HOLD Farxiga, Torsemide and Spironolactone on the morning of your procedure  On the morning of your procedure, take your Aspirin and any morning medicines NOT listed above.  You may use sips of water.  5. Plan for one night stay--bring personal belongings. 6. Bring a current list of your medications and current insurance cards. 7. You MUST have a responsible person to drive you home. 8. Someone MUST be with you the first 24 hours after you arrive home or your discharge will be delayed. 9. Please wear clothes that are easy to get on and off and wear slip-on shoes.  Thank you for allowing [place] to care for you!   -- [place] [facility]

## 2019-09-05 NOTE — Progress Notes (Signed)
ICD remote 

## 2019-09-08 ENCOUNTER — Ambulatory Visit (HOSPITAL_COMMUNITY)
Admission: RE | Admit: 2019-09-08 | Discharge: 2019-09-08 | Disposition: A | Discharge status: Home / Self Care | Payer: Medicare HMO | Attending: Internal Medicine | Admitting: Internal Medicine

## 2019-09-08 ENCOUNTER — Encounter (HOSPITAL_COMMUNITY)
Admission: RE | Disposition: A | Discharge status: Home / Self Care | Payer: Self-pay | Source: Home / Self Care | Attending: Internal Medicine

## 2019-09-08 ENCOUNTER — Other Ambulatory Visit: Payer: Self-pay

## 2019-09-08 DIAGNOSIS — Z7984 Long term (current) use of oral hypoglycemic drugs: Secondary | ICD-10-CM | POA: Insufficient documentation

## 2019-09-08 DIAGNOSIS — I5022 Chronic systolic (congestive) heart failure: Secondary | ICD-10-CM | POA: Diagnosis not present

## 2019-09-08 DIAGNOSIS — E785 Hyperlipidemia, unspecified: Secondary | ICD-10-CM | POA: Insufficient documentation

## 2019-09-08 DIAGNOSIS — Z79899 Other long term (current) drug therapy: Secondary | ICD-10-CM | POA: Insufficient documentation

## 2019-09-08 DIAGNOSIS — Z923 Personal history of irradiation: Secondary | ICD-10-CM | POA: Diagnosis not present

## 2019-09-08 DIAGNOSIS — F329 Major depressive disorder, single episode, unspecified: Secondary | ICD-10-CM | POA: Diagnosis not present

## 2019-09-08 DIAGNOSIS — M419 Scoliosis, unspecified: Secondary | ICD-10-CM | POA: Insufficient documentation

## 2019-09-08 DIAGNOSIS — R69 Illness, unspecified: Secondary | ICD-10-CM | POA: Diagnosis not present

## 2019-09-08 DIAGNOSIS — Z87891 Personal history of nicotine dependence: Secondary | ICD-10-CM | POA: Diagnosis not present

## 2019-09-08 DIAGNOSIS — I428 Other cardiomyopathies: Secondary | ICD-10-CM | POA: Diagnosis not present

## 2019-09-08 DIAGNOSIS — K7460 Unspecified cirrhosis of liver: Secondary | ICD-10-CM | POA: Insufficient documentation

## 2019-09-08 DIAGNOSIS — Z905 Acquired absence of kidney: Secondary | ICD-10-CM | POA: Diagnosis not present

## 2019-09-08 DIAGNOSIS — K219 Gastro-esophageal reflux disease without esophagitis: Secondary | ICD-10-CM | POA: Insufficient documentation

## 2019-09-08 DIAGNOSIS — Z9221 Personal history of antineoplastic chemotherapy: Secondary | ICD-10-CM | POA: Insufficient documentation

## 2019-09-08 DIAGNOSIS — Z85528 Personal history of other malignant neoplasm of kidney: Secondary | ICD-10-CM | POA: Insufficient documentation

## 2019-09-08 DIAGNOSIS — Z95810 Presence of automatic (implantable) cardiac defibrillator: Secondary | ICD-10-CM | POA: Diagnosis not present

## 2019-09-08 DIAGNOSIS — Z7982 Long term (current) use of aspirin: Secondary | ICD-10-CM | POA: Diagnosis not present

## 2019-09-08 HISTORY — PX: RIGHT HEART CATH: CATH118263

## 2019-09-08 LAB — POCT I-STAT EG7
Acid-Base Excess: 4 mmol/L — ABNORMAL HIGH (ref 0.0–2.0)
Acid-Base Excess: 5 mmol/L — ABNORMAL HIGH (ref 0.0–2.0)
Bicarbonate: 28.7 mmol/L — ABNORMAL HIGH (ref 20.0–28.0)
Bicarbonate: 30 mmol/L — ABNORMAL HIGH (ref 20.0–28.0)
Calcium, Ion: 1.15 mmol/L (ref 1.15–1.40)
Calcium, Ion: 1.25 mmol/L (ref 1.15–1.40)
HCT: 41 % (ref 39.0–52.0)
HCT: 42 % (ref 39.0–52.0)
Hemoglobin: 13.9 g/dL (ref 13.0–17.0)
Hemoglobin: 14.3 g/dL (ref 13.0–17.0)
O2 Saturation: 63 %
O2 Saturation: 63 %
Potassium: 3.5 mmol/L (ref 3.5–5.1)
Potassium: 3.7 mmol/L (ref 3.5–5.1)
Sodium: 139 mmol/L (ref 135–145)
Sodium: 138 mmol/L (ref 135–145)
TCO2: 30 mmol/L (ref 22–32)
TCO2: 31 mmol/L (ref 22–32)
pCO2, Ven: 44.2 mmHg (ref 44.0–60.0)
pCO2, Ven: 46 mmHg (ref 44.0–60.0)
pH, Ven: 7.42 (ref 7.250–7.430)
pH, Ven: 7.422 (ref 7.250–7.430)
pO2, Ven: 32 mmHg (ref 32.0–45.0)
pO2, Ven: 33 mmHg (ref 32.0–45.0)

## 2019-09-08 LAB — GLUCOSE, CAPILLARY
Glucose-Capillary: 149 mg/dL — ABNORMAL HIGH (ref 70–99)
Glucose-Capillary: 106 mg/dL — ABNORMAL HIGH (ref 70–99)

## 2019-09-08 SURGERY — RIGHT HEART CATH
Anesthesia: LOCAL

## 2019-09-08 MED ORDER — SODIUM CHLORIDE 0.9 % IV SOLN: INTRAVENOUS | Status: DC

## 2019-09-08 MED ORDER — SODIUM CHLORIDE 0.9% FLUSH: 3.0000 mL | INTRAVENOUS | Status: DC | PRN

## 2019-09-08 MED ORDER — SODIUM CHLORIDE 0.9% FLUSH: 3.0000 mL | Freq: Two times a day (BID) | INTRAVENOUS | Status: DC

## 2019-09-08 MED ORDER — SODIUM CHLORIDE 0.9 % IV SOLN: 250.0000 mL | INTRAVENOUS | Status: DC | PRN

## 2019-09-08 MED ORDER — LABETALOL HCL 5 MG/ML IV SOLN: 10.0000 mg | INTRAVENOUS | Status: DC | PRN

## 2019-09-08 MED ORDER — ACETAMINOPHEN 325 MG PO TABS: 650.0000 mg | ORAL_TABLET | ORAL | Status: DC | PRN

## 2019-09-08 MED ORDER — LIDOCAINE HCL (PF) 1 % IJ SOLN
INTRAMUSCULAR | Status: DC | PRN
  Administered 2019-09-08: 30 mL

## 2019-09-08 MED ORDER — HEPARIN (PORCINE) IN NACL 1000-0.9 UT/500ML-% IV SOLN
INTRAVENOUS | Status: DC | PRN
  Administered 2019-09-08: 500 mL

## 2019-09-08 MED ORDER — HEPARIN (PORCINE) IN NACL 1000-0.9 UT/500ML-% IV SOLN
INTRAVENOUS | Status: AC
  Filled 2019-09-08: qty 500

## 2019-09-08 MED ORDER — HYDRALAZINE HCL 20 MG/ML IJ SOLN: 10.0000 mg | INTRAMUSCULAR | Status: DC | PRN

## 2019-09-08 MED ORDER — ONDANSETRON HCL 4 MG/2ML IJ SOLN: 4.0000 mg | Freq: Four times a day (QID) | INTRAMUSCULAR | Status: DC | PRN

## 2019-09-08 MED ORDER — LIDOCAINE HCL (PF) 1 % IJ SOLN
INTRAMUSCULAR | Status: AC
  Filled 2019-09-08: qty 30

## 2019-09-08 SURGICAL SUPPLY — 7 items
CATH SWAN GANZ 7F STRAIGHT (CATHETERS) ×2 IMPLANT
CLOSURE MYNX CONTROL 6F/7F (Vascular Products) ×2 IMPLANT
KIT HEART LEFT (KITS) ×2 IMPLANT
PACK CARDIAC CATHETERIZATION (CUSTOM PROCEDURE TRAY) ×2 IMPLANT
SHEATH PINNACLE 7F 10CM (SHEATH) ×2 IMPLANT
SLEEVE REPOSITIONING LENGTH 30 (MISCELLANEOUS) ×2 IMPLANT
TRANSDUCER W/STOPCOCK (MISCELLANEOUS) ×2 IMPLANT

## 2019-09-08 NOTE — Interval H&P Note (Signed)
History and Physical Interval Note:  09/08/2019 3:01 PM  [name]  has presented today for surgery, with the diagnosis of heart failure.  The various methods of treatment have been discussed with the patient and family. After consideration of risks, benefits and other options for treatment, the patient has consented to  Procedure(s): RIGHT HEART CATH (N/A) as a surgical intervention.  The patient's history has been reviewed, patient examined, no change in status, stable for surgery.  I have reviewed the patient's chart and labs.  Questions were answered to the patient's satisfaction.     [name]

## 2019-09-08 NOTE — Discharge Instructions (Signed)

## 2019-09-15 ENCOUNTER — Ambulatory Visit (INDEPENDENT_AMBULATORY_CARE_PROVIDER_SITE_OTHER): Payer: Medicare HMO | Admitting: Internal Medicine

## 2019-09-15 ENCOUNTER — Other Ambulatory Visit: Payer: Self-pay

## 2019-09-15 ENCOUNTER — Encounter: Payer: Self-pay | Admitting: Internal Medicine

## 2019-09-15 VITALS — BP 120/66 | HR 102 | Temp 98.3°F | Ht 69.0 in | Wt 150.0 lb

## 2019-09-15 DIAGNOSIS — I5022 Chronic systolic (congestive) heart failure: Secondary | ICD-10-CM

## 2019-09-15 LAB — CUP PACEART INCLINIC DEVICE CHECK
Brady Statistic AS VP Percent: 1 % — CL
Date Time Interrogation Session: 20210406165819
Implantable Lead Implant Date: 20121214
Implantable Lead Location: 753860
Implantable Lead Model: 180
Implantable Lead Serial Number: 307189
Implantable Pulse Generator Implant Date: 20121214
Lead Channel Pacing Threshold Amplitude: 0.7 V
Lead Channel Pacing Threshold Pulse Width: 0.4 ms
Lead Channel Sensing Intrinsic Amplitude: 18.4 mV
Lead Channel Setting Pacing Amplitude: 2.4 V
Lead Channel Setting Pacing Pulse Width: 0.4 ms
Lead Channel Setting Sensing Sensitivity: 0.4 mV
Pulse Gen Serial Number: 118994

## 2019-09-15 NOTE — Progress Notes (Signed)
HPI [name] returns today for folllowup. He is a pleasant 49 yo man with a h/o chronic systolic heart failure, s/p ICD insertion, cirrhosis, TR and on maximal medical therapy. He has class 2 symptoms. This morning before coming in he mowed 3 lawns. He has not had chest pain or edema. His cirrhosis is thought due in part to TR but perhaps ETOH as well.  Allergies  Allergen Reactions  . Ibuprofen Hives  . Nsaids     ELEVATED LFT'S     Current Outpatient Medications  Medication Sig Dispense Refill  . aspirin 81 MG tablet Take 81 mg by mouth daily.      . Blood Glucose Monitoring Suppl (BLOOD GLUCOSE SYSTEM PAK) KIT Please dispense based on patient and insurance preference. Use to monitor FSBS 2-3x weekly. Dx: E11.9. 1 each 1  . carvedilol (COREG) 3.125 MG tablet TAKE 1 TABLET BY MOUTH TWICE DAILY WITH A MEAL (Patient taking differently: Take 3.125 mg by mouth in the morning and at bedtime. ) 180 tablet 0  . cetirizine (ZYRTEC) 10 MG tablet Take 1 tablet (10 mg total) by mouth daily. 30 tablet 11  . cyclobenzaprine (FLEXERIL) 10 MG tablet Take 1 tablet (10 mg total) by mouth 3 (three) times daily as needed for muscle spasms. 10 tablet 0  . dapagliflozin propanediol (FARXIGA) 10 MG TABS tablet Take 10 mg by mouth daily before breakfast. 30 tablet 6  . fluticasone (FLONASE) 50 MCG/ACT nasal spray Place 2 sprays into both nostrils daily. 16 g 6  . Glucose Blood (BLOOD GLUCOSE TEST STRIPS) STRP Please dispense based on patient and insurance preference. Use to monitor FSBS 2-3x weekly. Dx: E11.9. 50 each 1  . Lancet Devices MISC Please dispense based on patient and insurance preference. Use to monitor FSBS 2-3x weekly. Dx: E11.9. 1 each 1  . Lancets MISC Please dispense based on patient and insurance preference. Use to monitor FSBS 2-3x weekly. Dx: E11.9. 50 each 1  . metolazone (ZAROXOLYN) 2.5 MG tablet TAKE 1 TABLET BY MOUTH AS DIRECTED BY  HEART  FAILURE  CLINIC 15 tablet 0  . Multiple  Vitamin (MULTIVITAMIN WITH MINERALS) TABS tablet Take 1 tablet by mouth daily.    Marland Kitchen oxyCODONE (OXY IR/ROXICODONE) 5 MG immediate release tablet Take 1 tablet (5 mg total) by mouth 3 (three) times daily as needed for severe pain. 60 tablet 0  . potassium chloride SA (KLOR-CON) 20 MEQ tablet Take 1 tablet (20 mEq total) by mouth as directed. Take1 tab with Metalazone 90 tablet 3  . sacubitril-valsartan (ENTRESTO) 49-51 MG Take 1 tablet by mouth 2 (two) times daily. 180 tablet 3  . spironolactone (ALDACTONE) 25 MG tablet Take 1 tablet (25 mg total) by mouth daily. 90 tablet 3  . torsemide (DEMADEX) 20 MG tablet Take 2 tablets (40 mg total) by mouth daily. (Patient taking differently: Take 20 mg by mouth 2 (two) times daily. ) 180 tablet 3   No current facility-administered medications for this visit.     Past Medical History:  Diagnosis Date  . AICD (automatic cardioverter/defibrillator) present   . Arthritis   . Cardiomyopathy, nonischemic (HCC)    takes Digoxin daily and Carvedilol daily  . CHF (congestive heart failure) (HCC)    takes Lasix daily as well Aldactone  . Chronic back pain   . Chronic systolic heart failure ([place])   . Cirrhosis ([place])   . Depression    takes Prozac daily  . GERD (gastroesophageal  reflux disease)    takes Omeprazole daily  . Hx of Alcohol consumption heavy    rare beer currently  . hx of Tobacco abuse    quit smoking 2014  . Hyperlipidemia    was on Simvastatin but has been off a year  . Insomnia    takes Ambien as needed(just got script yesterday)  . Orthostatic hypotension   . Scoliosis   . 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 48 (XRT and chemo)    ROS:   All systems reviewed and negative except as noted in the HPI.   Past Surgical History:  Procedure Laterality Date  . CARPAL TUNNEL RELEASE Right 01/09/2013   Procedure: CARPAL TUNNEL RELEASE;  Surgeon: Winfield Cunas, MD;  Location: [place] [facility]O ORS;  Service: Neurosurgery;  Laterality: Right;  RIGHT  carpal tunnel release  . CARPAL TUNNEL RELEASE Left 01/30/2013   Procedure: LEFT CARPAL TUNNEL RELEASE;  Surgeon: Winfield Cunas, MD;  Location: [place] NEURO ORS;  Service: Neurosurgery;  Laterality: Left;  LEFT Carpal Tunnel release  . CHEST TUBE INSERTION Right 09/09/2013   Procedure: INSERTION PLEURAL DRAINAGE CATHETER;  Surgeon: Ivin Poot, MD;  Location: [facility];  Service: Thoracic;  Laterality: Right;  . HYDROCELE EXCISION Right 04/11/2017   Procedure: RIGHT HYDROCELECTOMY ADULT;  Surgeon: Irine Seal, MD;  Location: [facility];  Service: Urology;  Laterality: Right;  . hydrocelectomy  2008  . ICD  05/25/2011   [employer] Endotak Reliance SG lead/Energen single chamber device  . IMPLANTABLE CARDIOVERTER DEFIBRILLATOR IMPLANT N/A 05/25/2011   Procedure: IMPLANTABLE CARDIOVERTER DEFIBRILLATOR IMPLANT;  Surgeon: Evans Lance, MD;  Location: [facility] CATH LAB;  Service: Cardiovascular;  Laterality: N/A;  . NEPHRECTOMY  36 yrs ago   Wilms Tumor  . RIGHT HEART CATH N/A 01/29/2019   Procedure: RIGHT HEART CATH;  Surgeon: Jolaine Artist, MD;  Location: [place] CV LAB;  Service: Cardiovascular;  Laterality: N/A;  . RIGHT HEART CATH N/A 09/08/2019   Procedure: RIGHT HEART CATH;  Surgeon: Jolaine Artist, MD;  Location: [place] CV LAB;  Service: Cardiovascular;  Laterality: N/A;  . RIGHT/LEFT HEART CATH AND CORONARY ANGIOGRAPHY N/A 02/12/2017   Procedure: RIGHT/LEFT HEART CATH AND CORONARY ANGIOGRAPHY;  Surgeon: Jolaine Artist, MD;  Location: [place] CV LAB;  Service: Cardiovascular;  Laterality: N/A;  . ULNAR NERVE TRANSPOSITION Right 01/09/2013   Procedure: ULNAR NERVE DECOMPRESSION/TRANSPOSITION;  Surgeon: Winfield Cunas, MD;  Location: [place] NEURO ORS;  Service: Neurosurgery;  Laterality: Right;  RIGHT ulnar nerve decompression     Family History  Problem Relation Age of Onset  . Diabetes Mother      Social History   Socioeconomic History  . Marital status: Single     Spouse name: Not on file  . Number of children: 1  . Years of education: Not on file  . Highest education level: Not on file  Occupational History  . Occupation: Full time    Comment: Engineer, civil (consulting)  Tobacco Use  . Smoking status: Former Smoker    Packs/day: 0.25    Years: 20.00    Pack years: 5.00    Types: Cigarettes    Quit date: 09/07/2012    Years since quitting: 7.0  . Smokeless tobacco: Current User    Types: Snuff  Substance and Sexual Activity  . Alcohol use: Yes    Alcohol/week: 0.0 standard drinks    Comment: histoorically a heavy drinker ("beer only"), none for a couple months   .  Drug use: No  . Sexual activity: Not Currently  Other Topics Concern  . Not on file  Social History Narrative  . Not on file   Social Determinants of Health   Financial Resource Strain:   . Difficulty of Paying Living Expenses:   Food Insecurity:   . Worried About Charity fundraiser in the Last Year:   . Arboriculturist in the Last Year:   Transportation Needs:   . Film/video editor (Medical):   Marland Kitchen Lack of Transportation (Non-Medical):   Physical Activity:   . Days of Exercise per Week:   . Minutes of Exercise per Session:   Stress:   . Feeling of Stress :   Social Connections:   . Frequency of Communication with Friends and Family:   . Frequency of Social Gatherings with Friends and Family:   . Attends Religious Services:   . Active Member of Clubs or Organizations:   . Attends Archivist Meetings:   Marland Kitchen Marital Status:   Intimate Partner Violence:   . Fear of Current or Ex-Partner:   . Emotionally Abused:   Marland Kitchen Physically Abused:   . Sexually Abused:      BP 120/66   Pulse (!) 102   Temp 98.3 F (36.8 C)   Ht 5' 9"  (1.753 m)   SpO2 97%   BMI 22.15 kg/m   Physical Exam:  Well appearing NAD HEENT: Unremarkable Neck:  7 cm JVD, no thyromegally Lymphatics:  No adenopathy Back:  No CVA tenderness Lungs:  Clear with no wheezes HEART:  Regular  rate rhythm, no murmurs, no rubs, no clicks Abd:  soft, positive bowel sounds, no organomegally, no rebound, no guarding Ext:  2 plus pulses, no edema, no cyanosis, no clubbing Skin:  No rashes no nodules Neuro:  CN II through XII intact, motor grossly intact  DEVICE  Normal device function.  See PaceArt for details.   Assess/Plan: 1. Chronic systolic heart failure - he has class 2 symptoms and is tolerating maximal medical therapy. He will continue his current meds and maintain a low sodium diet. 2. TR - I cannot hear his TR on exam and he does not have a RV lift or heave. 3. Cirrhosis - he is well compensated and has no peripheral edema and minimal abdominal swelling.   Mikle Bosworth.D.

## 2019-09-15 NOTE — Patient Instructions (Signed)
Medication Instructions:  Your physician recommends that you continue on your current medications as directed. Please refer to the Current Medication list given to you today.  *If you need a refill on your cardiac medications before your next appointment, please call your pharmacy*   Lab Work: NONE   If you have labs (blood work) drawn today and your tests are completely normal, you will receive your results only by: . MyChart Message (if you have MyChart) OR . A paper copy in the mail If you have any lab test that is abnormal or we need to change your treatment, we will call you to review the results.   Testing/Procedures: NONE    Follow-Up: At [facility], you and your health needs are our priority.  As part of our continuing mission to provide you with exceptional heart care, we have created designated Provider Care Teams.  These Care Teams include your primary Cardiologist (physician) and Advanced Practice Providers (APPs -  Physician Assistants and Nurse Practitioners) who all work together to provide you with the care you need, when you need it.  We recommend signing up for the patient portal called "MyChart".  Sign up information is provided on this After Visit Summary.  MyChart is used to connect with patients for Virtual Visits (Telemedicine).  Patients are able to view lab/test results, encounter notes, upcoming appointments, etc.  Non-urgent messages can be sent to your provider as well.   To learn more about what you can do with MyChart, go to [email or web address].    Your next appointment:   1 year(s)  The format for your next appointment:   In Person  Provider:   Gregg Taylor, MD   Other Instructions Thank you for choosing [facility]!    

## 2019-09-17 ENCOUNTER — Encounter: Payer: Self-pay | Admitting: Nutrition

## 2019-09-17 ENCOUNTER — Encounter: Payer: Medicare HMO | Attending: Family Medicine | Admitting: Nutrition

## 2019-09-17 ENCOUNTER — Other Ambulatory Visit: Payer: Self-pay

## 2019-09-17 VITALS — Ht 69.0 in | Wt 154.0 lb

## 2019-09-17 DIAGNOSIS — E118 Type 2 diabetes mellitus with unspecified complications: Secondary | ICD-10-CM | POA: Diagnosis not present

## 2019-09-17 DIAGNOSIS — E1165 Type 2 diabetes mellitus with hyperglycemia: Secondary | ICD-10-CM | POA: Insufficient documentation

## 2019-09-17 DIAGNOSIS — IMO0002 Reserved for concepts with insufficient information to code with codable children: Secondary | ICD-10-CM

## 2019-09-17 DIAGNOSIS — I5022 Chronic systolic (congestive) heart failure: Secondary | ICD-10-CM | POA: Diagnosis not present

## 2019-09-17 DIAGNOSIS — K7030 Alcoholic cirrhosis of liver without ascites: Secondary | ICD-10-CM | POA: Insufficient documentation

## 2019-09-17 DIAGNOSIS — E782 Mixed hyperlipidemia: Secondary | ICD-10-CM

## 2019-09-17 DIAGNOSIS — R69 Illness, unspecified: Secondary | ICD-10-CM | POA: Diagnosis not present

## 2019-09-17 NOTE — Patient Instructions (Signed)
Goals  Eat three meals per day Cut out sodas and drink only water Cut down on fast food and cook more at home Keep exercising and staying busy. Test blood sugars in am and occasionally at bedtime Cut out sweets, processed foods, junk food  Increase fresh fruits, vegetables and whole grains.

## 2019-09-17 NOTE — Progress Notes (Signed)
  Medical Nutrition Therapy:  Appt start time: 0930 end time:  1030.   Assessment:  Primary concerns today: Diabetes Type 2, Newly diangosed. PMH: CHF, HTN, Hyperlipidemia and Cirrhosis. Denies drinking or smoking. Lives with son, 47 yrs old. Eat out a lot. On disability. Eats 2-3 times per day. PCP Dr. Buelah Manis. Sees Dr. Haroldine Laws Cardiologist. He admits to drinking a lot of sodas, eating a lot of fast food. Stays active working in yards and mowing. Doesn't eat a lot of vegetables but likes fruit. Willing to work on improving eating habits and drinking water instead of sodas. Currently taking Faxiga daily for his DM.  Creatine level elevated. Has 1 kidney. Diet is high  Lab Results  Component Value Date   HGBA1C 6.7 (H) 08/07/2019   CMP Latest Ref Rng & Units 09/08/2019 09/08/2019 09/04/2019  Glucose 70 - 99 mg/dL - - 131(H)  BUN 6 - 20 mg/dL - - 22(H)  Creatinine 0.61 - 1.24 mg/dL - - 1.30(H)  Sodium 135 - 145 mmol/L 138 139 137  Potassium 3.5 - 5.1 mmol/L 3.7 3.5 3.4(L)  Chloride 98 - 111 mmol/L - - 94(L)  CO2 22 - 32 mmol/L - - 31  Calcium 8.9 - 10.3 mg/dL - - 9.3  Total Protein 6.1 - 8.1 g/dL - - -  Total Bilirubin 0.2 - 1.2 mg/dL - - -  Alkaline Phos 38 - 126 U/L - - -  AST 10 - 40 U/L - - -  ALT 9 - 46 U/L - - -    Preferred Learning Style:   No preference indicated   Learning Readiness:   Ready  Change in progress   MEDICATIONS:    DIETARY INTAKE:  24-hr recall:  B ( AM): Oatmeal or bacon and eggs,  Soda, OJ or chocolate milk Snk ( AM):   L ( PM): chicken pot pie, Mt dew Snk ( PM):  D ( PM): PIzza 2 slices, Mt Dew Snk ( PM): Potato chips, soda Beverages: soda, water  Usual physical activity: mows grass, yard work  Estimated energy needs: 2000 calories 2258 g carbohydrates 150 g protein 56 g fat  Progress Towards Goal(s):  In progress.   Nutritional Diagnosis:  NB-1.1 Food and nutrition-related knowledge deficit As related to DIabetes Type 2.  As  evidenced by A1C 6.7%.    Intervention:  Nutrition and Diabetes education provided on My Plate, CHO counting, meal planning, portion sizes, timing of meals, avoiding snacks between meals unless having a low blood sugar, target ranges for A1C and blood sugars, signs/symptoms and treatment of hyper/hypoglycemia, monitoring blood sugars, taking medications as prescribed, benefits of exercising 30 minutes per day and prevention of complications of DM. Marland Kitchen Goals  Eat three meals per day Cut out sodas and drink only water Cut down on fast food and cook more at home Keep exercising and staying busy. Test blood sugars in am and occasionally at bedtime Cut out sweets, processed foods, junk food  Increase fresh fruits, vegetables and whole grains.   Teaching Method Utilized:  Visual Auditory Hands on  Handouts given during visit include:  The Plate Method   Diabetes Instructions  LIving well with DM  Barriers to learning/adherence to lifestyle change: none  Demonstrated degree of understanding via:  Teach Back   Monitoring/Evaluation:  Dietary intake, exercise, , and body weight in 1 month(s). Suggest to check eGFR to evaluate kidney function.

## 2019-09-22 ENCOUNTER — Telehealth (HOSPITAL_COMMUNITY): Payer: Self-pay | Admitting: *Deleted

## 2019-09-22 MED ORDER — POTASSIUM CHLORIDE CRYS ER 20 MEQ PO TBCR: 20.0000 meq | EXTENDED_RELEASE_TABLET | Freq: Every day | ORAL | 3 refills | Status: DC

## 2019-09-22 NOTE — Telephone Encounter (Signed)
-----   Message from Jolaine Artist, MD sent at 09/20/2019  9:31 PM EDT ----- Please add kdur 20 daily. Take 2 tabs the first day.  Repeat BMET in 2 weeks

## 2019-09-22 NOTE — Telephone Encounter (Signed)
[name], [place]  09/22/2019 8:56 AM EDT    Pt aware and agreeable. Script mailed to pts home address for pt to have labs drawn in [place] and results faxed to our clinic.   [name], [place]  09/22/2019 8:51 AM EDT    Left VM for pt to call for lab results.    [name], Shaune Pascal, MD  09/20/2019 9:31 PM EDT    Please add kdur 20 daily. Take 2 tabs the first day. Repeat BMET in 2 weeks

## 2019-09-25 ENCOUNTER — Other Ambulatory Visit: Payer: Self-pay | Admitting: *Deleted

## 2019-09-25 MED ORDER — OXYCODONE HCL 5 MG PO TABS: 5.0000 mg | ORAL_TABLET | Freq: Three times a day (TID) | ORAL | 0 refills | Status: DC | PRN

## 2019-09-25 NOTE — Telephone Encounter (Signed)
Received call from patient.   Requested refill on Oxycodone.   Ok to refill??  Last office visit 08/07/2019.  Last refill 08/26/2019.

## 2019-09-30 DIAGNOSIS — I5022 Chronic systolic (congestive) heart failure: Secondary | ICD-10-CM | POA: Diagnosis not present

## 2019-09-30 DIAGNOSIS — I361 Nonrheumatic tricuspid (valve) insufficiency: Secondary | ICD-10-CM | POA: Diagnosis not present

## 2019-10-17 ENCOUNTER — Other Ambulatory Visit (HOSPITAL_COMMUNITY): Payer: Self-pay | Admitting: Internal Medicine

## 2019-10-17 DIAGNOSIS — I5022 Chronic systolic (congestive) heart failure: Secondary | ICD-10-CM

## 2019-10-22 ENCOUNTER — Ambulatory Visit: Payer: Medicare HMO | Admitting: Nutrition

## 2019-10-26 ENCOUNTER — Other Ambulatory Visit: Payer: Self-pay | Admitting: *Deleted

## 2019-10-26 MED ORDER — OXYCODONE HCL 5 MG PO TABS: 5.0000 mg | ORAL_TABLET | Freq: Three times a day (TID) | ORAL | 0 refills | Status: DC | PRN

## 2019-10-26 NOTE — Telephone Encounter (Signed)
Received call from patient.   Requested refill on Oxycodone.   Ok to refill??  Last office visit 08/07/2019.  Last refill 09/25/2019.

## 2019-10-29 ENCOUNTER — Telehealth: Payer: Self-pay | Admitting: *Deleted

## 2019-10-29 NOTE — Telephone Encounter (Signed)
Spoke with patient. Advised that Latitude monitor is disconnected. Pt reports it is plugged into the wall, but he does not think it has an adapter. He would prefer to receive an internet adapter. Pt is aware that adapter will be ordered; confirmed home address. Pt agreeable to plan and denies additional questions or concerns at this time.  Message sent to [facility], [employer], requesting adapter be shipped to the pt.

## 2019-11-13 NOTE — Telephone Encounter (Signed)
ICD monitor up to date as of 11/08/19. Next transmission on 12/22/19.

## 2019-11-26 ENCOUNTER — Other Ambulatory Visit: Payer: Self-pay | Admitting: *Deleted

## 2019-11-26 NOTE — Telephone Encounter (Signed)
Received call from patient.   Requested refill on Oxycodone.   Ok to refill??  Last office visit 08/07/2019.  Last refill  10/26/2019.

## 2019-11-27 MED ORDER — OXYCODONE HCL 5 MG PO TABS: 5.0000 mg | ORAL_TABLET | Freq: Three times a day (TID) | ORAL | 0 refills | Status: DC | PRN

## 2019-12-07 ENCOUNTER — Encounter: Payer: Self-pay | Admitting: Family Medicine

## 2019-12-07 ENCOUNTER — Other Ambulatory Visit: Payer: Self-pay

## 2019-12-07 ENCOUNTER — Ambulatory Visit (INDEPENDENT_AMBULATORY_CARE_PROVIDER_SITE_OTHER): Payer: Medicare HMO | Admitting: Family Medicine

## 2019-12-07 VITALS — BP 122/68 | HR 70 | Temp 98.0°F | Resp 14 | Ht 69.0 in | Wt 147.0 lb

## 2019-12-07 DIAGNOSIS — K7030 Alcoholic cirrhosis of liver without ascites: Secondary | ICD-10-CM

## 2019-12-07 DIAGNOSIS — J0100 Acute maxillary sinusitis, unspecified: Secondary | ICD-10-CM

## 2019-12-07 DIAGNOSIS — G894 Chronic pain syndrome: Secondary | ICD-10-CM | POA: Diagnosis not present

## 2019-12-07 DIAGNOSIS — E782 Mixed hyperlipidemia: Secondary | ICD-10-CM

## 2019-12-07 DIAGNOSIS — E1169 Type 2 diabetes mellitus with other specified complication: Secondary | ICD-10-CM | POA: Diagnosis not present

## 2019-12-07 DIAGNOSIS — R69 Illness, unspecified: Secondary | ICD-10-CM | POA: Diagnosis not present

## 2019-12-07 MED ORDER — AMOXICILLIN 875 MG PO TABS: 875.0000 mg | ORAL_TABLET | Freq: Two times a day (BID) | ORAL | 0 refills | Status: DC

## 2019-12-07 MED ORDER — METHYLPREDNISOLONE ACETATE 40 MG/ML IJ SUSP
40.0000 mg | Freq: Once | INTRAMUSCULAR | Status: AC
  Administered 2019-12-07: 40 mg via INTRAMUSCULAR

## 2019-12-07 MED ORDER — LEVOCETIRIZINE DIHYDROCHLORIDE 5 MG PO TABS: 5.0000 mg | ORAL_TABLET | Freq: Every evening | ORAL | 3 refills | Status: DC

## 2019-12-07 NOTE — Progress Notes (Signed)
Subjective:    Patient ID: [name], male    DOB: 08-May-1971, 49 y.o.   MRN: [id number]  Patient presents for Sinus Pressure (x weeks- pressure in face, nasal drainage with green colored mucus)  Pt here to f/u chronic medical problems   Medications reviewed     DM- last A1C 6.7% -he stopped farxiga due to hypoglycemia symptoms  , he was diagnosed in Feb, previously borderline DM . Due for repeat A1C   CBG at home range 90-110  Cut back on soft drinks    CHF- on entresto, demadex, has metaolzone on hand but doesn't take daily , no fluid overload or SOB/CHEST PAIN  Cirrhosis of liver, has remained off ETOH , has f/u with GI in a few months   Hyperlipidemia- trying to management with diet, due to liver disease no STATIN drug   Chronic pain maintained on oxycodone prn   Sinus pressure drainage for the past few weeks, no fever no cough. Has thick green mucous . COVID19 is UTD , not improving with netty pot, zyrtec      Review Of Systems:  GEN- denies fatigue, fever, weight loss,weakness, recent illness HEENT- denies eye drainage, change in vision, nasal discharge, CVS- denies chest pain, palpitations RESP- denies SOB, cough, wheeze ABD- denies N/V, change in stools, abd pain GU- denies dysuria, hematuria, dribbling, incontinence MSK- denies joint pain, muscle aches, injury Neuro- denies headache, dizziness, syncope, seizure activity       Objective:    BP 122/68   Pulse 70   Temp 98 F (36.7 C) (Temporal)   Resp 14   Ht 5\' 9"  (1.753 m)   Wt 147 lb (66.7 kg)   SpO2 96%   BMI 21.71 kg/m  GEN- NAD, alert and oriented x3 HEENT- PERRL, EOMI, non injected sclera, pink conjunctiva, MMM, oropharynx clear, TM clear bilat no effusion,  + maxillary sinus tenderness, inflammed turbinates,  Nasal drainage  Neck- Supple, no LAD CVS- RRR, no murmur RESP-CTAB EXT- No edema Pulses- Radial 2+         Assessment & Plan:      Problem List Items Addressed This  Visit      Unprioritized   Chronic pain syndrome    Continue oxycodone      Cirrhosis of liver (HCC) - Primary    GI scheduled, no sign of fluid overload He has remained off ETOH       Relevant Orders   CBC with Differential/Platelet (Completed)   DM (diabetes mellitus) ([place])    Improved diet so he was having hypoglycemia symptoms  [name] is indicated for CAD, but he was symptomatic so will check A1C today and make decision to restart or not , may be able to tolerate 5mg  dose and still get benefits      Relevant Orders   Comprehensive metabolic panel (Completed)   Hemoglobin A1c (Completed)   HM DIABETES FOOT EXAM (Completed)   Microalbumin / creatinine urine ratio   CBC with Differential/Platelet (Completed)   Hyperlipidemia   Relevant Orders   Lipid panel (Completed)    Other Visit Diagnoses    Acute maxillary sinusitis, recurrence not specified       D/C zyrtec, change to xyzal, add amox, continue nasal saline rinse   Relevant Medications   levocetirizine (XYZAL) 5 MG tablet   amoxicillin (AMOXIL) 875 MG tablet   methylPREDNISolone acetate (DEPO-MEDROL) injection 40 mg (Completed)      Note: This dictation was prepared with Diplomatic Services operational officer  dictation along with smaller phrase technology. Any transcriptional errors that result from this process are unintentional.

## 2019-12-07 NOTE — Patient Instructions (Addendum)
Schedule with cardiology  Take antibiotics as prescribed Try new allergy medication xyzal F/U as previous

## 2019-12-08 ENCOUNTER — Encounter: Payer: Self-pay | Admitting: Family Medicine

## 2019-12-08 NOTE — Assessment & Plan Note (Signed)
Improved diet so he was having hypoglycemia symptoms  Phineas Real is indicated for CAD, but he was symptomatic so will check A1C today and make decision to restart or not , may be able to tolerate 5mg  dose and still get benefits

## 2019-12-08 NOTE — Assessment & Plan Note (Signed)
GI scheduled, no sign of fluid overload He has remained off ETOH

## 2019-12-08 NOTE — Assessment & Plan Note (Signed)
Continue oxycodone 

## 2019-12-09 LAB — COMPREHENSIVE METABOLIC PANEL
AG Ratio: 1.2 (calc) (ref 1.0–2.5)
ALT: 20 U/L (ref 9–46)
AST: 26 U/L (ref 10–40)
Albumin: 4 g/dL (ref 3.6–5.1)
Alkaline phosphatase (APISO): 236 U/L — ABNORMAL HIGH (ref 36–130)
BUN: 12 mg/dL (ref 7–25)
CO2: 31 mmol/L (ref 20–32)
Calcium: 9.7 mg/dL (ref 8.6–10.3)
Chloride: 97 mmol/L — ABNORMAL LOW (ref 98–110)
Creat: 1.06 mg/dL (ref 0.60–1.35)
Globulin: 3.4 g/dL (calc) (ref 1.9–3.7)
Glucose, Bld: 134 mg/dL — ABNORMAL HIGH (ref 65–99)
Potassium: 4.2 mmol/L (ref 3.5–5.3)
Sodium: 138 mmol/L (ref 135–146)
Total Bilirubin: 1.1 mg/dL (ref 0.2–1.2)
Total Protein: 7.4 g/dL (ref 6.1–8.1)

## 2019-12-09 LAB — CBC WITH DIFFERENTIAL/PLATELET
Absolute Monocytes: 683 cells/uL (ref 200–950)
Basophils Absolute: 52 cells/uL (ref 0–200)
Basophils Relative: 0.8 %
Eosinophils Absolute: 143 cells/uL (ref 15–500)
Eosinophils Relative: 2.2 %
HCT: 43.1 % (ref 38.5–50.0)
Hemoglobin: 14.5 g/dL (ref 13.2–17.1)
Lymphs Abs: 546 cells/uL — ABNORMAL LOW (ref 850–3900)
MCH: 33.1 pg — ABNORMAL HIGH (ref 27.0–33.0)
MCHC: 33.6 g/dL (ref 32.0–36.0)
MCV: 98.4 fL (ref 80.0–100.0)
MPV: 9.5 fL (ref 7.5–12.5)
Monocytes Relative: 10.5 %
Neutro Abs: 5077 cells/uL (ref 1500–7800)
Neutrophils Relative %: 78.1 %
Platelets: 373 10*3/uL (ref 140–400)
RBC: 4.38 10*6/uL (ref 4.20–5.80)
RDW: 13.2 % (ref 11.0–15.0)
Total Lymphocyte: 8.4 %
WBC: 6.5 10*3/uL (ref 3.8–10.8)

## 2019-12-09 LAB — LIPID PANEL
Cholesterol: 218 mg/dL — ABNORMAL HIGH (ref <200)
HDL: 45 mg/dL (ref >=40)
LDL Cholesterol (Calc): 155 mg/dL (calc) — ABNORMAL HIGH
Non-HDL Cholesterol (Calc): 173 mg/dL (calc) — ABNORMAL HIGH (ref <130)
Total CHOL/HDL Ratio: 4.8 (calc) (ref <5.0)
Triglycerides: 81 mg/dL (ref <150)

## 2019-12-09 LAB — HEMOGLOBIN A1C
Hgb A1c MFr Bld: 5.9 % of total Hgb — ABNORMAL HIGH (ref <5.7)
Mean Plasma Glucose: 123 (calc)
eAG (mmol/L): 6.8 (calc)

## 2019-12-09 LAB — MICROALBUMIN / CREATININE URINE RATIO

## 2019-12-22 ENCOUNTER — Other Ambulatory Visit: Payer: Self-pay

## 2019-12-22 DIAGNOSIS — K7460 Unspecified cirrhosis of liver: Secondary | ICD-10-CM

## 2019-12-22 NOTE — Progress Notes (Signed)
LM for pt that he has an appt with [facility] on Friday of this week but is overdue for RUQ Ultrasound.  Asked him to call back so that we can get him scheduled.  He also needs labs and Twinrix series.

## 2019-12-25 ENCOUNTER — Ambulatory Visit: Payer: Medicare HMO | Admitting: Gastroenterology

## 2019-12-28 ENCOUNTER — Other Ambulatory Visit: Payer: Self-pay | Admitting: *Deleted

## 2019-12-28 NOTE — Telephone Encounter (Signed)
Received call from patient.   Requested refill on Oxycodone.   Ok to refill??  Last office visit 12/07/2019.  Last refill 11/27/2019.

## 2019-12-29 MED ORDER — OXYCODONE HCL 5 MG PO TABS: 5.0000 mg | ORAL_TABLET | Freq: Three times a day (TID) | ORAL | 0 refills | Status: DC | PRN

## 2020-01-27 ENCOUNTER — Other Ambulatory Visit: Payer: Self-pay | Admitting: *Deleted

## 2020-01-27 MED ORDER — OXYCODONE HCL 5 MG PO TABS: 5.0000 mg | ORAL_TABLET | Freq: Three times a day (TID) | ORAL | 0 refills | Status: DC | PRN

## 2020-01-27 NOTE — Telephone Encounter (Signed)
Received call from patient.   Requested refill on Oxycodone.   Ok to refill??  Last office visit 12/07/2019  Last refill 12/29/2019.

## 2020-02-18 ENCOUNTER — Other Ambulatory Visit (INDEPENDENT_AMBULATORY_CARE_PROVIDER_SITE_OTHER): Payer: Medicare HMO

## 2020-02-18 ENCOUNTER — Encounter: Payer: Self-pay | Admitting: Gastroenterology

## 2020-02-18 ENCOUNTER — Ambulatory Visit (INDEPENDENT_AMBULATORY_CARE_PROVIDER_SITE_OTHER): Payer: Medicare HMO | Admitting: Gastroenterology

## 2020-02-18 VITALS — BP 110/64 | HR 88 | Ht 69.0 in | Wt 147.2 lb

## 2020-02-18 DIAGNOSIS — Z1211 Encounter for screening for malignant neoplasm of colon: Secondary | ICD-10-CM

## 2020-02-18 DIAGNOSIS — I5022 Chronic systolic (congestive) heart failure: Secondary | ICD-10-CM

## 2020-02-18 DIAGNOSIS — K7460 Unspecified cirrhosis of liver: Secondary | ICD-10-CM

## 2020-02-18 DIAGNOSIS — Z23 Encounter for immunization: Secondary | ICD-10-CM | POA: Diagnosis not present

## 2020-02-18 LAB — PROTIME-INR
INR: 1.2 ratio — ABNORMAL HIGH (ref 0.8–1.0)
Prothrombin Time: 13.5 s — ABNORMAL HIGH (ref 9.6–13.1)

## 2020-02-18 LAB — HEPATIC FUNCTION PANEL
ALT: 27 U/L (ref 0–53)
AST: 31 U/L (ref 0–37)
Albumin: 4.2 g/dL (ref 3.5–5.2)
Alkaline Phosphatase: 150 U/L — ABNORMAL HIGH (ref 39–117)
Bilirubin, Direct: 0.3 mg/dL (ref 0.0–0.3)
Total Bilirubin: 1.1 mg/dL (ref 0.2–1.2)
Total Protein: 7.7 g/dL (ref 6.0–8.3)

## 2020-02-18 LAB — GAMMA GT: GGT: 270 U/L — ABNORMAL HIGH (ref 7–51)

## 2020-02-18 NOTE — Progress Notes (Signed)
HPI :  49 year old male here for a follow-up visit for cirrhosis.  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  I previously saw him in October 2020.  At that point time he was referred to [place] for newly diagnosed cirrhosis as he is being evaluated at [facility] for possible heart transplant, they are considering him for dual organ transplant.  He does have a history of ascites previously but does not complain of it now, no history of jaundice.  Given he has been taking Coreg for his heart he has not had an EGD for varices screening.  He had a paracentesis to clarify the etiology of his ascites.  His SAAG was greater than 1.1, and total protein value was 4.7.  That paracentesis was in September 2020 and has not needed one since.  His diuretics are managed by Dr. Haroldine Laws, on aldactone and torsemide which is working really well for him.   He does have a significant alcohol history.  He states some days he would drink several drinks a day, usually more on the weekend.  He drank heavily in his 53s. He had a serologic work-up done for chronic liver diseases since we have last seen him and these were negative.  He was not immune to hepatitis a and B and got the first dose of his vaccine but did not follow up for the rest of the series.   He generally does not drink much alcohol as he states he does not get out much, however had a reunion of some friends this past week and had 7 beers in one sitting.  He states he will rarely have a few beers at a time.  He denies any significant shortness of breath or cardiopulmonary symptoms right now at rest.  He has not had a prior colonoscopy.  No family history of colon cancer.  Denies any bowel issues at present, no blood in stools.  No abdominal pains.  He states he has a follow-up visit with cardiology soon and they are considering some therapy for one of his  heart valves. Last imaging of his abdomen in 03/2019.  His liver enzymes have looked okay other than elevated alkaline phosphatase persistently.   Echo 11/21/18 - EF 15%   Past Medical History:  Diagnosis Date   AICD (automatic cardioverter/defibrillator) present    Arthritis    Cardiomyopathy, nonischemic (HCC)    takes Digoxin daily and Carvedilol daily   CHF (congestive heart failure) (HCC)    takes Lasix daily as well Aldactone   Chronic back pain    Chronic systolic heart failure (HCC)    Cirrhosis ([place])    Depression    takes Prozac daily   GERD (gastroesophageal reflux disease)    takes Omeprazole daily   Hx of Alcohol consumption heavy    rare beer currently   hx of Tobacco abuse    quit smoking 2014   Hyperlipidemia    was on Simvastatin but has been off a year   Insomnia    takes Ambien as needed(just got script yesterday)   Orthostatic hypotension    Scoliosis    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 and chemo)     Past Surgical History:  Procedure Laterality Date   CARPAL TUNNEL RELEASE Right 01/09/2013   Procedure: CARPAL TUNNEL RELEASE;  Surgeon: Winfield Cunas, MD;  Location: [place]  ORS;  Service: Neurosurgery;  Laterality: Right;  RIGHT carpal tunnel release   CARPAL TUNNEL RELEASE Left 01/30/2013   Procedure: LEFT CARPAL TUNNEL RELEASE;  Surgeon: Winfield Cunas, MD;  Location: [place] NEURO ORS;  Service: Neurosurgery;  Laterality: Left;  LEFT Carpal Tunnel release   CHEST TUBE INSERTION Right 09/09/2013   Procedure: INSERTION PLEURAL DRAINAGE CATHETER;  Surgeon: Ivin Poot, MD;  Location: [place];  Service: Thoracic;  Laterality: Right;   HYDROCELE EXCISION Right 04/11/2017   Procedure: RIGHT HYDROCELECTOMY ADULT;  Surgeon: Irine Seal, MD;  Location: [facility] ORS;  Service: Urology;  Laterality: Right;   hydrocelectomy  2008   ICD  05/25/2011   [facility] Scientific [employer] SG lead/[employer]ner[employer] single chamber device   IMPLANTABLE  CARDIOVERTER DEFIBRILLATOR IMPLANT N/A 05/25/2011   Procedure: IMPLANTABLE CARDIOVERTER DEFIBRILLATOR IMPLANT;  Surgeon: Evans Lance, MD;  Location: [facility] CATH LAB;  Service: Cardiovascular;  Laterality: N/A;   NEPHRECTOMY  36 yrs ago   Wilms Tumor   RIGHT HEART CATH N/A 01/29/2019   Procedure: RIGHT HEART CATH;  Surgeon: Jolaine Artist, MD;  Location: [place] CV LAB;  Service: Cardiovascular;  Laterality: N/A;   RIGHT HEART CATH N/A 09/08/2019   Procedure: RIGHT HEART CATH;  Surgeon: Jolaine Artist, MD;  Location: [place] CV LAB;  Service: Cardiovascular;  Laterality: N/A;   RIGHT/LEFT HEART CATH AND CORONARY ANGIOGRAPHY N/A 02/12/2017   Procedure: RIGHT/LEFT HEART CATH AND CORONARY ANGIOGRAPHY;  Surgeon: Jolaine Artist, MD;  Location: [place] CV LAB;  Service: Cardiovascular;  Laterality: N/A;   ULNAR NERVE TRANSPOSITION Right 01/09/2013   Procedure: ULNAR NERVE DECOMPRESSION/TRANSPOSITION;  Surgeon: Winfield Cunas, MD;  Location: [place] NEURO ORS;  Service: Neurosurgery;  Laterality: Right;  RIGHT ulnar nerve decompression   Family History  Problem Relation Age of Onset   Diabetes Mother    Social History   Tobacco Use   Smoking status: Former Smoker    Packs/day: 0.25    Years: 20.00    Pack years: 5.00    Types: Cigarettes    Quit date: 09/07/2012    Years since quitting: 7.4   Smokeless tobacco: Current User    Types: Snuff  Vaping Use   Vaping Use: Never used  Substance Use Topics   Alcohol use: Yes    Alcohol/week: 0.0 standard drinks    Comment: histoorically a heavy drinker ("beer only"), none for a couple months    Drug use: No   Current Outpatient Medications  Medication Sig Dispense Refill   amoxicillin (AMOXIL) 875 MG tablet Take 1 tablet (875 mg total) by mouth 2 (two) times daily. 20 tablet 0   aspirin 81 MG tablet Take 81 mg by mouth daily.       Blood Glucose Monitoring Suppl (BLOOD GLUCOSE SYSTEM PAK) KIT Please dispense based  on patient and insurance preference. Use to monitor FSBS 2-3x weekly. Dx: E11.9. 1 each 1   carvedilol (COREG) 3.125 MG tablet TAKE 1 TABLET BY MOUTH TWICE DAILY WITH A MEAL (Patient taking differently: Take 3.125 mg by mouth in the morning and at bedtime. ) 180 tablet 0   fluticasone (FLONASE) 50 MCG/ACT nasal spray Place 2 sprays into both nostrils daily. 16 g 6   Glucose Blood (BLOOD GLUCOSE TEST STRIPS) STRP Please dispense based on patient and insurance preference. Use to monitor FSBS 2-3x weekly. Dx: E11.9. 50 each 1   Lancet Devices MISC Please dispense based on patient and insurance preference. Use to monitor  FSBS 2-3x weekly. Dx: E11.9. 1 each 1   Lancets MISC Please dispense based on patient and insurance preference. Use to monitor FSBS 2-3x weekly. Dx: E11.9. 50 each 1   levocetirizine (XYZAL) 5 MG tablet Take 1 tablet (5 mg total) by mouth every evening. 30 tablet 3   metolazone (ZAROXOLYN) 2.5 MG tablet TAKE 1 TABLET BY MOUTH AS DIRECTED BY  HEART  FAILURE  CLINIC 15 tablet 0   Multiple Vitamin (MULTIVITAMIN WITH MINERALS) TABS tablet Take 1 tablet by mouth daily.     oxyCODONE (OXY IR/ROXICODONE) 5 MG immediate release tablet Take 1 tablet (5 mg total) by mouth 3 (three) times daily as needed for severe pain. 60 tablet 0   potassium chloride SA (KLOR-CON) 20 MEQ tablet Take 1 tablet (20 mEq total) by mouth daily. Take an additional 83mq with Metolazone. 90 tablet 3   sacubitril-valsartan (ENTRESTO) 49-51 MG Take 1 tablet by mouth 2 (two) times daily. 180 tablet 3   spironolactone (ALDACTONE) 25 MG tablet Take 1 tablet (25 mg total) by mouth daily. 90 tablet 3   torsemide (DEMADEX) 20 MG tablet Take 2 tablets by mouth once daily 180 tablet 3   No current facility-administered medications for this visit.   Allergies  Allergen Reactions   Ibuprofen Hives   Nsaids     ELEVATED LFT'S     Review of Systems: All systems reviewed and negative except where noted in HPI.    Lab Results  Component Value Date   WBC 6.5 12/07/2019   HGB 14.5 12/07/2019   HCT 43.1 12/07/2019   MCV 98.4 12/07/2019   PLT 373 12/07/2019    Lab Results  Component Value Date   CREATININE 1.06 12/07/2019   BUN 12 12/07/2019   NA 138 12/07/2019   K 4.2 12/07/2019   CL 97 (L) 12/07/2019   CO2 31 12/07/2019    Lab Results  Component Value Date   ALT 27 02/18/2020   AST 31 02/18/2020   ALKPHOS 150 (H) 02/18/2020   BILITOT 1.1 02/18/2020     Physical Exam: BP 110/64    Pulse 88    Ht _0  (1.753 m)    Wt 147 lb 3.2 oz (66.8 kg)    BMI 21.74 kg/m  Constitutional: Pleasant,well-developed, male in no acute distress. Abdominal: Soft, nondistended, nontender.  There are no masses palpable.  Extremities: no edema Lymphadenopathy: No cervical adenopathy noted. Neurological: Alert and oriented to person place and time. Skin: Skin is warm and dry. No rashes noted. Psychiatric: Normal mood and affect. Behavior is normal.   ASSESSMENT AND PLAN: 49year old male here for reassessment of the following:  Cirrhosis / CHF - as above he has a severe nonischemic cardiomyopathy with an EF of 15%, [facility]  They are considering him for multi organ transplant given his history of cirrhosis.  His cirrhosis is thought to be possibly alcohol induced plus minus cardiogenic.  His ascitic fluid was more consistent with cardiogenic etiology.  With aggressive diuretics and management of his heart failure his ascites has resolved and his volume status looks good.  I had a lengthy discussion with him about cirrhosis, risks of decompensation and HCC.  He must abstain completely from alcohol to minimize his risk for progression of liver disease, and to be considered a candidate for liver transplant /heart transplant.  He verbalized understanding with this.  [facility] so we will coordinate that.  He has had a pretty extensive  evaluation for his liver disease, AP is mildly elevated, will send GGT to confirm a trauma from his liver.  Also due for AFP and INR level.  He is also due for follow-up Twinrix vaccine.  He should see me every 6 months, if not sooner with any issues.  Given he is on Coreg we do not need to screen for esophageal varices.  He agreed  Colon cancer screening - he is due for routine colon cancer screening we discussed options for that.  At some point in time he is agreeable to a colonoscopy, however he has some close follow-up with cardiology pending and they may consider additional interventions on his heart from what he tells me, although I do not see any clear plans of that in his chart.  He is high risk for anesthesia given his severe cardiomyopathy and he is asymptomatic in regards to lower tract symptoms.  He wants to hold off on colonoscopy right now and await his course with cardiology.  If he is going to be considered for liver and heart transplant he will likely need this.  He wants to hold off for now we will reassess this at his next visit.  Wallington Cellar, MD [facility]

## 2020-02-18 NOTE — Patient Instructions (Addendum)
If you are age 47 or older, your body mass index should be between 23-30. Your Body mass index is 21.74 kg/m. If this is out of the aforementioned range listed, please consider follow up with your Primary Care Provider.  If you are age 30 or younger, your body mass index should be between 19-25. Your Body mass index is 21.74 kg/m. If this is out of the aformentioned range listed, please consider follow up with your Primary Care Provider.    You have been scheduled for an abdominal ultrasound at [facility] (1st floor of hospital) on Friday, 02-26-20 at 8:30am. Please arrive 15 minutes prior to your appointment for registration. Make certain not to have anything to eat or drink 6 hours prior to your appointment. Should you need to reschedule your appointment, please contact radiology at [phone]. This test typically takes about 30 minutes to perform.  Please go to the lab in the basement of our building to have lab work done as you leave today. Hit "B" for basement when you get on the elevator.  When the doors open the lab is on your left.  We will call you with the results. Thank you.  Due to recent changes in healthcare laws, you may see the results of your imaging and laboratory studies on MyChart before your provider has had a chance to review them.  We understand that in some cases there may be results that are confusing or concerning to you. Not all laboratory results come back in the same time frame and the provider may be waiting for multiple results in order to interpret others.  Please give [place] 48 hours in order for your provider to thoroughly review all the results before contacting the office for clarification of your results.   We have given you your first Twinrix injection today to protect you against Hepatitis A and Hepatitis B.  Your next injection will be in 30 days on Monday, 03-21-20 at 10:30 am.  If you need to reschedule please call our office at [phone].  Please follow up in  6 months.  We will send you a reminder when it is time to schedule.  Thank you for entrusting me with your care and for choosing [facility], Dr. Dunbar Cellar

## 2020-02-19 ENCOUNTER — Other Ambulatory Visit: Payer: Self-pay

## 2020-02-19 DIAGNOSIS — I5022 Chronic systolic (congestive) heart failure: Secondary | ICD-10-CM

## 2020-02-19 DIAGNOSIS — K7460 Unspecified cirrhosis of liver: Secondary | ICD-10-CM

## 2020-02-19 LAB — AFP TUMOR MARKER: AFP-Tumor Marker: 8.6 ng/mL — ABNORMAL HIGH (ref <6.1)

## 2020-02-22 ENCOUNTER — Ambulatory Visit (INDEPENDENT_AMBULATORY_CARE_PROVIDER_SITE_OTHER): Payer: Medicare HMO | Admitting: *Deleted

## 2020-02-22 DIAGNOSIS — I5022 Chronic systolic (congestive) heart failure: Secondary | ICD-10-CM | POA: Diagnosis not present

## 2020-02-24 LAB — CUP PACEART REMOTE DEVICE CHECK
Battery Remaining Longevity: 60 mo
Battery Remaining Percentage: 66 %
Brady Statistic RV Percent Paced: 0 %
Date Time Interrogation Session: 20210913010200
HighPow Impedance: 82 Ohm
Implantable Lead Implant Date: 20121214
Implantable Lead Location: 753860
Implantable Lead Model: 180
Implantable Lead Serial Number: 307189
Implantable Pulse Generator Implant Date: 20121214
Lead Channel Impedance Value: 570 Ohm
Lead Channel Pacing Threshold Amplitude: 0.7 V
Lead Channel Pacing Threshold Pulse Width: 0.4 ms
Lead Channel Setting Pacing Amplitude: 2.4 V
Lead Channel Setting Pacing Pulse Width: 0.4 ms
Lead Channel Setting Sensing Sensitivity: 0.4 mV
Pulse Gen Serial Number: 118994

## 2020-02-24 NOTE — Progress Notes (Signed)
Remote ICD transmission.   

## 2020-02-25 ENCOUNTER — Other Ambulatory Visit (HOSPITAL_COMMUNITY): Payer: Self-pay | Admitting: Internal Medicine

## 2020-02-26 ENCOUNTER — Ambulatory Visit (HOSPITAL_COMMUNITY)
Admission: RE | Admit: 2020-02-26 | Discharge: 2020-02-26 | Disposition: A | Discharge status: Home / Self Care | Payer: Medicare HMO | Source: Ambulatory Visit | Attending: Gastroenterology | Admitting: Gastroenterology

## 2020-02-26 ENCOUNTER — Other Ambulatory Visit: Payer: Self-pay | Admitting: *Deleted

## 2020-02-26 ENCOUNTER — Other Ambulatory Visit: Payer: Self-pay

## 2020-02-26 DIAGNOSIS — R188 Other ascites: Secondary | ICD-10-CM | POA: Diagnosis not present

## 2020-02-26 DIAGNOSIS — K7460 Unspecified cirrhosis of liver: Secondary | ICD-10-CM | POA: Insufficient documentation

## 2020-02-26 MED ORDER — OXYCODONE HCL 5 MG PO TABS
5.0000 mg | ORAL_TABLET | Freq: Three times a day (TID) | ORAL | 0 refills | Status: DC | PRN
Start: 2020-02-26 — End: 2020-03-28

## 2020-02-26 NOTE — Telephone Encounter (Signed)
Received call from patient.   Requested refill on Oxycodone.  Ok to refill??  Last office visit 12/07/2019.  Last refill 01/27/2020.

## 2020-03-21 ENCOUNTER — Ambulatory Visit (INDEPENDENT_AMBULATORY_CARE_PROVIDER_SITE_OTHER): Payer: Medicare HMO | Admitting: Gastroenterology

## 2020-03-21 DIAGNOSIS — Z23 Encounter for immunization: Secondary | ICD-10-CM

## 2020-03-22 NOTE — Progress Notes (Signed)
Pt received his 2nd Twinrix injection. Tolerated well

## 2020-03-28 ENCOUNTER — Other Ambulatory Visit: Payer: Self-pay | Admitting: *Deleted

## 2020-03-28 MED ORDER — OXYCODONE HCL 5 MG PO TABS
5.0000 mg | ORAL_TABLET | Freq: Three times a day (TID) | ORAL | 0 refills | Status: DC | PRN
Start: 2020-03-28 — End: 2020-04-27

## 2020-03-28 NOTE — Telephone Encounter (Signed)
Received call from patient.   Requested refill on Oxycodone.   Ok to refill??  Last office visit 12/07/2019.  Last refill 02/26/2020.

## 2020-03-30 ENCOUNTER — Encounter: Payer: Medicaid Other | Admitting: Family Medicine

## 2020-04-08 ENCOUNTER — Ambulatory Visit (INDEPENDENT_AMBULATORY_CARE_PROVIDER_SITE_OTHER): Payer: Medicare HMO | Admitting: Family Medicine

## 2020-04-08 ENCOUNTER — Other Ambulatory Visit: Payer: Self-pay

## 2020-04-08 VITALS — BP 100/64 | HR 91 | Temp 98.2°F | Ht 65.0 in | Wt 151.2 lb

## 2020-04-08 DIAGNOSIS — E782 Mixed hyperlipidemia: Secondary | ICD-10-CM

## 2020-04-08 DIAGNOSIS — I5022 Chronic systolic (congestive) heart failure: Secondary | ICD-10-CM

## 2020-04-08 DIAGNOSIS — Z0001 Encounter for general adult medical examination with abnormal findings: Secondary | ICD-10-CM

## 2020-04-08 DIAGNOSIS — E1169 Type 2 diabetes mellitus with other specified complication: Secondary | ICD-10-CM | POA: Diagnosis not present

## 2020-04-08 DIAGNOSIS — Z23 Encounter for immunization: Secondary | ICD-10-CM | POA: Diagnosis not present

## 2020-04-08 DIAGNOSIS — Z0000 Encounter for general adult medical examination without abnormal findings: Secondary | ICD-10-CM

## 2020-04-08 DIAGNOSIS — G894 Chronic pain syndrome: Secondary | ICD-10-CM

## 2020-04-08 DIAGNOSIS — R69 Illness, unspecified: Secondary | ICD-10-CM | POA: Diagnosis not present

## 2020-04-08 DIAGNOSIS — K7030 Alcoholic cirrhosis of liver without ascites: Secondary | ICD-10-CM

## 2020-04-08 NOTE — Progress Notes (Signed)
Subjective:   Patient presents for Medicare Annual/Subsequent preventive examination.  Cirrhosis of the liver.  He is followed by GI he h.  He is taking his diuretics as prescribed.    CHF-no changes in his medications.  There was some concern about his beta-blocker from GI standpoint however he requires his from his cardiovascular same point.  He has not had any chest pain no shortness of breath he was able to exercise over the weekend without difficulty.  No fluid retention.  He is following with his cardiologist.   Chronic pain -  taking meds as prescribed   DM- last A1C 5.9%, currently diet controlled   he has seen nutritionist     Hyperlipidemia- due for fasting labs for lipids     Due for flu shot    Review Past Medical/Family/Social: Per EMR   Risk Factors  Current exercise habits: stays active  Dietary issues discussed: Blood sugars  Cardiac risk factors: CHF, Cirrhosis, HTN  Depression Screen  (Note: if answer to either of the following is "Yes", a more complete depression screening is indicated)  Over the past two weeks, have you felt down, depressed or hopeless? No Over the past two weeks, have you felt little interest or pleasure in doing things? No Have you lost interest or pleasure in daily life? No Do you often feel hopeless? No Do you cry easily over simple problems? No   Activities of Daily Living  In your present state of health, do you have any difficulty performing the following activities?:  Driving? No  Managing money? No  Feeding yourself? No  Getting from bed to chair? No  Climbing a flight of stairs? No  Preparing food and eating?: No  Bathing or showering? No  Getting dressed: No  Getting to the toilet? No  Using the toilet:No  Moving around from place to place: No  In the past year have you fallen or had a near fall?:No    Hearing Difficulties: No  Do you often ask people to speak up or repeat themselves? No  Do you experience  ringing or noises in your ears? No Do you have difficulty understanding soft or whispered voices? No  Do you feel that you have a problem with memory? No Do you often misplace items? No  Do you feel safe at home? Yes  Cognitive Testing  Alert? Yes Normal Appearance?Yes  Oriented to person? Yes Place? Yes  Time? Yes  Recall of three objects? Yes  Can perform simple calculations? Yes  Displays appropriate judgment?Yes  Can read the correct time from a watch face?Yes   List the Names of Other Physician/Practitioners you currently use:Per above ,cardiology, [facility]  Screening Tests / Date Influenza VaccineDue Tetanus/tdap- Due   ROS: GEN-Denes fatigue, deniesfever, weight loss,weakness, recent illness HEENT- denies eye drainage, change in vision, nasal discharge, CVS- denies chest pain, palpitations RESP- denies SOB, cough, wheeze ABD- denies N/V, change in stools, abd pain GU- denies dysuria, hematuria, dribbling, incontinence MSK- denies joint pain, muscle aches, injury Neuro- denies headache, dizziness, syncope, seizure activity  PHYSICAL: GEN- NAD, alert and oriented x3 HEENT- PERRL, EOMI, non injected sclera, pink conjunctiva, MMM, oropharynx clear Neck- Supple, no thryomegaly CVS- RRR, no murmur,  RESP-CTAB ABD-NABS,soft,NT,ND  EXT- No edema Pulses- Radial, DP- 2+  Fall/audit C/DEPRESSION neg  Assessment:    Annual wellness medicare exam   Plan:    During the course of the visit the patient was educated and counseled about appropriate screening  and preventive services including:  Fasting labs to be obtained. Immunization - flu shot given  CHF currently compensated followed by cardiology reviewed his medications everything is up-to-date.  Chronic pain his oxycodone was refilled no changes at this time.  Avoiding acetaminophen and NSAIDs  DM type 2-.  Trying to control with diet, discussed scheduling eye  exam  Cirrhosis of liver currently compensated  Check labs today   Diet review for nutrition referral? Yes ____ Not   Indicated __x__  Patient Instructions (the written plan) was given to the patient.  Medicare Attestation  I have personally reviewed:  The patient's medical and social history  Their use of alcohol, tobacco or illicit drugs  Their current medications and supplements  The patient's functional ability including ADLs,fall risks, home safety risks, cognitive, and hearing and visual impairment  Diet and physical activities  Evidence for depression or mood disorders  The patient's weight, height, BMI, and visual acuity have been recorded in the chart. I have made referrals, counseling, and provided education to the patient based on review of the above and I have provided the patient with a written personalized care plan for preventive services.

## 2020-04-08 NOTE — Patient Instructions (Signed)
Schedule and eye exam Flu shot given  F/U 3 months

## 2020-04-09 ENCOUNTER — Encounter: Payer: Self-pay | Admitting: Family Medicine

## 2020-04-09 LAB — CBC WITH DIFFERENTIAL/PLATELET
Absolute Monocytes: 602 cells/uL (ref 200–950)
Basophils Absolute: 30 cells/uL (ref 0–200)
Basophils Relative: 0.7 %
Eosinophils Absolute: 69 cells/uL (ref 15–500)
Eosinophils Relative: 1.6 %
HCT: 41 % (ref 38.5–50.0)
Hemoglobin: 14.4 g/dL (ref 13.2–17.1)
Lymphs Abs: 469 cells/uL — ABNORMAL LOW (ref 850–3900)
MCH: 34.6 pg — ABNORMAL HIGH (ref 27.0–33.0)
MCHC: 35.1 g/dL (ref 32.0–36.0)
MCV: 98.6 fL (ref 80.0–100.0)
MPV: 10.2 fL (ref 7.5–12.5)
Monocytes Relative: 14 %
Neutro Abs: 3130 cells/uL (ref 1500–7800)
Neutrophils Relative %: 72.8 %
Platelets: 258 10*3/uL (ref 140–400)
RBC: 4.16 10*6/uL — ABNORMAL LOW (ref 4.20–5.80)
RDW: 14.2 % (ref 11.0–15.0)
Total Lymphocyte: 10.9 %
WBC: 4.3 10*3/uL (ref 3.8–10.8)

## 2020-04-09 LAB — COMPREHENSIVE METABOLIC PANEL
AG Ratio: 1.3 (calc) (ref 1.0–2.5)
ALT: 23 U/L (ref 9–46)
AST: 30 U/L (ref 10–40)
Albumin: 4 g/dL (ref 3.6–5.1)
Alkaline phosphatase (APISO): 154 U/L — ABNORMAL HIGH (ref 36–130)
BUN: 14 mg/dL (ref 7–25)
CO2: 31 mmol/L (ref 20–32)
Calcium: 9.5 mg/dL (ref 8.6–10.3)
Chloride: 95 mmol/L — ABNORMAL LOW (ref 98–110)
Creat: 1.13 mg/dL (ref 0.60–1.35)
Globulin: 3 g/dL (calc) (ref 1.9–3.7)
Glucose, Bld: 130 mg/dL — ABNORMAL HIGH (ref 65–99)
Potassium: 3.7 mmol/L (ref 3.5–5.3)
Sodium: 139 mmol/L (ref 135–146)
Total Bilirubin: 1.4 mg/dL — ABNORMAL HIGH (ref 0.2–1.2)
Total Protein: 7 g/dL (ref 6.1–8.1)

## 2020-04-09 LAB — LIPID PANEL
Cholesterol: 187 mg/dL (ref <200)
HDL: 60 mg/dL (ref >=40)
LDL Cholesterol (Calc): 112 mg/dL (calc) — ABNORMAL HIGH
Non-HDL Cholesterol (Calc): 127 mg/dL (calc) (ref <130)
Total CHOL/HDL Ratio: 3.1 (calc) (ref <5.0)
Triglycerides: 61 mg/dL (ref <150)

## 2020-04-09 LAB — HEMOGLOBIN A1C
Hgb A1c MFr Bld: 6.3 % of total Hgb — ABNORMAL HIGH (ref <5.7)
Mean Plasma Glucose: 134 (calc)
eAG (mmol/L): 7.4 (calc)

## 2020-04-11 ENCOUNTER — Encounter: Payer: Self-pay | Admitting: *Deleted

## 2020-04-27 ENCOUNTER — Other Ambulatory Visit: Payer: Self-pay | Admitting: *Deleted

## 2020-04-27 MED ORDER — OXYCODONE HCL 5 MG PO TABS: 5.0000 mg | ORAL_TABLET | Freq: Three times a day (TID) | ORAL | 0 refills | Status: DC | PRN

## 2020-04-27 NOTE — Telephone Encounter (Signed)
Received call from patient.   Requested refill on Oxycodone.   Ok to refill??  Last office visit 04/08/2020.  Last refill 03/28/2020.

## 2020-05-27 ENCOUNTER — Other Ambulatory Visit: Payer: Self-pay | Admitting: *Deleted

## 2020-05-27 MED ORDER — OXYCODONE HCL 5 MG PO TABS: 5.0000 mg | ORAL_TABLET | Freq: Three times a day (TID) | ORAL | 0 refills | Status: DC | PRN

## 2020-05-27 NOTE — Telephone Encounter (Signed)
Received call from patient.   Requested refill on Oxycodone.   Ok to refill??  Last office visit 04/08/2020.  Last refill 04/27/2020.

## 2020-06-14 ENCOUNTER — Other Ambulatory Visit (HOSPITAL_COMMUNITY): Payer: Self-pay | Admitting: Internal Medicine

## 2020-06-30 ENCOUNTER — Other Ambulatory Visit: Payer: Self-pay | Admitting: *Deleted

## 2020-06-30 MED ORDER — OXYCODONE HCL 5 MG PO TABS
5.0000 mg | ORAL_TABLET | Freq: Three times a day (TID) | ORAL | 0 refills | Status: DC | PRN
Start: 2020-06-30 — End: 2020-07-28

## 2020-06-30 NOTE — Telephone Encounter (Signed)
Received VM from patient.   Requested refill on Oxycodone.   Ok to refill??  Last office visit 04/08/2020.  Last refill 05/27/2020.

## 2020-07-05 ENCOUNTER — Other Ambulatory Visit: Payer: Self-pay | Admitting: Family Medicine

## 2020-07-06 ENCOUNTER — Ambulatory Visit: Payer: Medicare HMO | Admitting: Family Medicine

## 2020-07-11 ENCOUNTER — Encounter: Payer: Self-pay | Admitting: Family Medicine

## 2020-07-11 ENCOUNTER — Ambulatory Visit (INDEPENDENT_AMBULATORY_CARE_PROVIDER_SITE_OTHER): Payer: Medicare HMO | Admitting: Family Medicine

## 2020-07-11 ENCOUNTER — Other Ambulatory Visit: Payer: Self-pay

## 2020-07-11 VITALS — BP 106/74 | HR 88 | Temp 98.0°F | Resp 14 | Ht 65.0 in | Wt 156.0 lb

## 2020-07-11 DIAGNOSIS — Z8781 Personal history of (healed) traumatic fracture: Secondary | ICD-10-CM

## 2020-07-11 DIAGNOSIS — I5022 Chronic systolic (congestive) heart failure: Secondary | ICD-10-CM | POA: Diagnosis not present

## 2020-07-11 DIAGNOSIS — K7030 Alcoholic cirrhosis of liver without ascites: Secondary | ICD-10-CM | POA: Diagnosis not present

## 2020-07-11 DIAGNOSIS — E1169 Type 2 diabetes mellitus with other specified complication: Secondary | ICD-10-CM

## 2020-07-11 DIAGNOSIS — G894 Chronic pain syndrome: Secondary | ICD-10-CM

## 2020-07-11 DIAGNOSIS — E782 Mixed hyperlipidemia: Secondary | ICD-10-CM | POA: Diagnosis not present

## 2020-07-11 DIAGNOSIS — R69 Illness, unspecified: Secondary | ICD-10-CM | POA: Diagnosis not present

## 2020-07-11 NOTE — Patient Instructions (Addendum)
F/U 4 months- Dr. Pickard  

## 2020-07-11 NOTE — Assessment & Plan Note (Signed)
Continue  Oxycodone, chronic back pain, scoliosis Avoid NSAIDs with cardiac history Reduce tylenol use due to cirrhosis

## 2020-07-11 NOTE — Assessment & Plan Note (Signed)
Followed by GI Check LFT

## 2020-07-11 NOTE — Progress Notes (Signed)
   Subjective:    Patient ID: [name], male    DOB: [birth year], 50 y.o.   MRN: [id number]  Patient presents for Follow-up (Is fasting/)   Pt here to f/u chronic medical problems  His SOn had COVID-19 last week, he tested negative and never had symptoms Vaccines and booster UTD    DM- A1 6.3% in Oct , no current meds due for recheck    HLD- last LDL 112    CHF- taking entresto, coreg, aldacton, demadex   Chronic pain on oxycodone    No concerns today    Review Of Systems:  GEN- denies fatigue, fever, weight loss,weakness, recent illness HEENT- denies eye drainage, change in vision, nasal discharge, CVS- denies chest pain, palpitations RESP- denies SOB, cough, wheeze ABD- denies N/V, change in stools, abd pain GU- denies dysuria, hematuria, dribbling, incontinence MSK- denies joint pain, muscle aches, injury Neuro- denies headache, dizziness, syncope, seizure activity       Objective:    BP 106/74   Pulse 88   Temp 98 F (36.7 C) (Temporal)   Resp 14   Ht 5\' 5"  (1.651 m)   Wt 156 lb (70.8 kg)   SpO2 95%   BMI 25.96 kg/m  GEN- NAD, alert and oriented x3 HEENT- PERRL, EOMI, non injected sclera, pink conjunctiva, MMM, oropharynx clear Neck- Supple, no thyromegaly CVS- RRR, no murmur RESP-CTAB ABD-NABS,soft,NT,ND EXT- No edema Pulses- Radial, DP- 2+        Assessment & Plan:      Problem List Items Addressed This Visit      Unprioritized   Chronic CHF (congestive heart failure) ([place]) - Primary    Currently compensated, no change to meds BP controlled      Relevant Orders   CBC with Differential/Platelet   Comprehensive metabolic panel   Chronic pain syndrome    Continue  Oxycodone, chronic back pain, scoliosis Avoid NSAIDs with cardiac history Reduce tylenol use due to cirrhosis      Cirrhosis of liver (HCC)    Followed by GI Check LFT      DM (diabetes mellitus) (HCC)    Diet controlled, recheck labs      Relevant Orders    Hemoglobin A1c   H/O compression fracture of spine   Hyperlipidemia   Relevant Orders   Lipid panel      Note: This dictation was prepared with Dragon dictation along with smaller phrase technology. Any transcriptional errors that result from this process are unintentional.

## 2020-07-11 NOTE — Assessment & Plan Note (Signed)
Diet controlled, recheck labs 

## 2020-07-11 NOTE — Assessment & Plan Note (Signed)
Currently compensated, no change to meds BP controlled

## 2020-07-12 LAB — COMPREHENSIVE METABOLIC PANEL
AG Ratio: 1.2 (calc) (ref 1.0–2.5)
ALT: 21 U/L (ref 9–46)
AST: 30 U/L (ref 10–40)
Albumin: 4.1 g/dL (ref 3.6–5.1)
Alkaline phosphatase (APISO): 208 U/L — ABNORMAL HIGH (ref 36–130)
BUN: 19 mg/dL (ref 7–25)
CO2: 31 mmol/L (ref 20–32)
Calcium: 9.6 mg/dL (ref 8.6–10.3)
Chloride: 94 mmol/L — ABNORMAL LOW (ref 98–110)
Creat: 1.31 mg/dL (ref 0.60–1.35)
Globulin: 3.4 g/dL (calc) (ref 1.9–3.7)
Glucose, Bld: 111 mg/dL — ABNORMAL HIGH (ref 65–99)
Potassium: 4.1 mmol/L (ref 3.5–5.3)
Sodium: 137 mmol/L (ref 135–146)
Total Bilirubin: 1.5 mg/dL — ABNORMAL HIGH (ref 0.2–1.2)
Total Protein: 7.5 g/dL (ref 6.1–8.1)

## 2020-07-12 LAB — CBC WITH DIFFERENTIAL/PLATELET
Absolute Monocytes: 842 cells/uL (ref 200–950)
Basophils Absolute: 61 cells/uL (ref 0–200)
Basophils Relative: 1.1 %
Eosinophils Absolute: 138 cells/uL (ref 15–500)
Eosinophils Relative: 2.5 %
HCT: 46.6 % (ref 38.5–50.0)
Hemoglobin: 15.9 g/dL (ref 13.2–17.1)
Lymphs Abs: 649 cells/uL — ABNORMAL LOW (ref 850–3900)
MCH: 32.1 pg (ref 27.0–33.0)
MCHC: 34.1 g/dL (ref 32.0–36.0)
MCV: 94 fL (ref 80.0–100.0)
MPV: 10.2 fL (ref 7.5–12.5)
Monocytes Relative: 15.3 %
Neutro Abs: 3812 cells/uL (ref 1500–7800)
Neutrophils Relative %: 69.3 %
Platelets: 274 10*3/uL (ref 140–400)
RBC: 4.96 10*6/uL (ref 4.20–5.80)
RDW: 13.2 % (ref 11.0–15.0)
Total Lymphocyte: 11.8 %
WBC: 5.5 10*3/uL (ref 3.8–10.8)

## 2020-07-12 LAB — LIPID PANEL
Cholesterol: 215 mg/dL — ABNORMAL HIGH (ref <200)
HDL: 47 mg/dL (ref >=40)
LDL Cholesterol (Calc): 149 mg/dL (calc) — ABNORMAL HIGH
Non-HDL Cholesterol (Calc): 168 mg/dL (calc) — ABNORMAL HIGH (ref <130)
Total CHOL/HDL Ratio: 4.6 (calc) (ref <5.0)
Triglycerides: 89 mg/dL (ref <150)

## 2020-07-12 LAB — HEMOGLOBIN A1C
Hgb A1c MFr Bld: 7 % of total Hgb — ABNORMAL HIGH (ref <5.7)
Mean Plasma Glucose: 154 mg/dL
eAG (mmol/L): 8.5 mmol/L

## 2020-07-28 ENCOUNTER — Other Ambulatory Visit: Payer: Self-pay | Admitting: *Deleted

## 2020-07-28 NOTE — Telephone Encounter (Signed)
Received call from patient.   Requested refill on Oxycodone.   Ok to refill??  Last office visit 07/11/2020.  Last refill 06/30/2020.

## 2020-07-29 MED ORDER — OXYCODONE HCL 5 MG PO TABS: 5.0000 mg | ORAL_TABLET | Freq: Three times a day (TID) | ORAL | 0 refills | Status: DC | PRN

## 2020-08-18 ENCOUNTER — Telehealth: Payer: Self-pay

## 2020-08-18 NOTE — Telephone Encounter (Signed)
-----   Message from Yevette Edwards, RN sent at 02/19/2020 12:21 PM EDT ----- Regarding: Labs Repeat labs, order in epic

## 2020-08-18 NOTE — Telephone Encounter (Signed)
Lm on vm for patient to return call 

## 2020-08-18 NOTE — Telephone Encounter (Signed)
Spoke with patient to remind him that he is due for repeat labs at this time. Patient is aware that no appointment is necessary, he can stop by the lab at his convenience between 7:30 AM - 5 PM, Monday through Friday. Patient verbalized understanding and had no concerns at the end of the call.

## 2020-08-22 ENCOUNTER — Other Ambulatory Visit (INDEPENDENT_AMBULATORY_CARE_PROVIDER_SITE_OTHER): Payer: Medicare HMO

## 2020-08-22 ENCOUNTER — Ambulatory Visit (INDEPENDENT_AMBULATORY_CARE_PROVIDER_SITE_OTHER): Payer: Medicare HMO | Admitting: Gastroenterology

## 2020-08-22 ENCOUNTER — Encounter: Payer: Self-pay | Admitting: Gastroenterology

## 2020-08-22 VITALS — BP 100/78 | HR 100 | Ht 65.75 in | Wt 155.1 lb

## 2020-08-22 DIAGNOSIS — Z1211 Encounter for screening for malignant neoplasm of colon: Secondary | ICD-10-CM | POA: Diagnosis not present

## 2020-08-22 DIAGNOSIS — I509 Heart failure, unspecified: Secondary | ICD-10-CM

## 2020-08-22 DIAGNOSIS — I5022 Chronic systolic (congestive) heart failure: Secondary | ICD-10-CM

## 2020-08-22 DIAGNOSIS — Z23 Encounter for immunization: Secondary | ICD-10-CM | POA: Diagnosis not present

## 2020-08-22 DIAGNOSIS — K7460 Unspecified cirrhosis of liver: Secondary | ICD-10-CM

## 2020-08-22 LAB — PROTIME-INR
INR: 1.2 ratio — ABNORMAL HIGH (ref 0.8–1.0)
Prothrombin Time: 13.6 s — ABNORMAL HIGH (ref 9.6–13.1)

## 2020-08-22 NOTE — Patient Instructions (Addendum)
If you are age 48 or older, your body mass index should be between 23-30. Your Body mass index is 25.23 kg/m. If this is out of the aforementioned range listed, please consider follow up with your Primary Care Provider.  If you are age 71 or younger, your body mass index should be between 19-25. Your Body mass index is 25.23 kg/m. If this is out of the aformentioned range listed, please consider follow up with your Primary Care Provider.   We have given you your 3rd and final Twinrix vaccine today.  _____________________________________________________________________________  Dennis Bast have been scheduled for an abdominal ultrasound at [facility]  on Wednesday, 08-31-20 at 8:30am. Please arrive 15 minutes prior to your appointment for registration. Make certain not to have anything to eat or drink 6 hours prior to your appointment. Should you need to reschedule your appointment, please contact radiology at [phone]. This test typically takes about 30 minutes to perform.  _______________________________________________________________________________  Please go to the lab in the basement of our building to have lab work done as you leave today. Hit "B" for basement when you get on the elevator.  When the doors open the lab is on your left.  We will call you with the results. Thank you.    Due to recent changes in healthcare laws, you may see the results of your imaging and laboratory studies on MyChart before your provider has had a chance to review them.  We understand that in some cases there may be results that are confusing or concerning to you. Not all laboratory results come back in the same time frame and the provider may be waiting for multiple results in order to interpret others.  Please give [place] 48 hours in order for your provider to thoroughly review all the results before contacting the office for clarification of your results  Please follow up in 6 months. We will send you a  reminder when it is time to schedule your appointment.    Thank you for entrusting me with your care and for choosing [facility], Dr.  Cellar

## 2020-08-22 NOTE — Progress Notes (Signed)
HPI :  50 year old male here for a follow-up visit for cirrhosis.  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  Recall that I met him in October 2020.  He has been seen by [facility] in the past for possible dual organ transplant of his heart and liver.  He has not seen him since our last visit in September of last year.  He historically has had a history of ascites but currently not an issue for him.  No history of jaundice or encephalopathy.  He has been on Coreg for his cardiovascular issues thus he has not had an EGD for variceal screening.  He has been on torsemide and Aldactone for his heart and he states he been doing really well on the regimen.  Denies any cardiopulmonary symptoms at this time.  He does not have any lower extremity edema of note.  He states he avoids putting salt on his food and tries to be compliant with a low-sodium diet.  He has not had any problems with jaundice or hepatic decompensation since have seen him.  He does have a significant alcohol history.  I have recommended complete abstinence and he states he has a few beers per month, has not been able to stop completely.  He has started the Twinrix vaccine series and is due for his final vaccine today.  Generally he feels well without complaints, able to function all right at this time.  He has no family history of colon cancer, has never had a prior colonoscopy.  Denies any blood in his stools or problems with his bowels.  We have held off on screening colonoscopy at the last visit until he could see cardiology for follow-up, he has a scheduled visit to see Dr. Haroldine Laws in 2 weeks.   Echo 11/21/18 - EF 15%  RUQ [place] 02/26/20 - cirrhosis with 14m polyp vs. SJoaquim Lai Past Medical History:  Diagnosis Date  . AICD (automatic cardioverter/defibrillator) present   . Arthritis   . Cardiomyopathy, nonischemic (HCC)     takes Digoxin daily and Carvedilol daily  . CHF (congestive heart failure) (HCC)    takes Lasix daily as well Aldactone  . Chronic back pain   . Chronic systolic heart failure ([place]   . Cirrhosis ([place]   . Depression    takes Prozac daily  . GERD (gastroesophageal reflux disease)    takes Omeprazole daily  . Hx of Alcohol consumption heavy    rare beer currently  . hx of Tobacco abuse    quit smoking 2014  . Hyperlipidemia    was on Simvastatin but has been off a year  . Insomnia    takes Ambien as needed(just got script yesterday)  . Orthostatic hypotension   . Scoliosis   . 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 89(XRT and chemo)     Past Surgical History:  Procedure Laterality Date  . CARPAL TUNNEL RELEASE Right 01/09/2013   Procedure: CARPAL TUNNEL RELEASE;  Surgeon: KWinfield Cunas MD;  [facility];  Service: Neurosurgery;  Laterality: Right;  RIGHT carpal tunnel release  . CARPAL TUNNEL RELEASE Left 01/30/2013   Procedure: LEFT CARPAL TUNNEL RELEASE;  Surgeon: KWinfield Cunas MD;  [facility];  Service: Neurosurgery;  Laterality: Left;  LEFT Carpal Tunnel release  . CHEST TUBE INSERTION Right 09/09/2013   Procedure: INSERTION PLEURAL DRAINAGE  CATHETER;  Surgeon: Kerin Perna, MD;  Location: [facility] OR;  Service: Thoracic;  Laterality: Right;  . HYDROCELE EXCISION Right 04/11/2017   Procedure: RIGHT HYDROCELECTOMY ADULT;  Surgeon: Bjorn Pippin, MD;  Location: [facility];  Service: Urology;  Laterality: Right;  . hydrocelectomy  2008  . ICD  05/25/2011   [employer] End[employer] SG lead/Energen single chamber device  . IMPLANTABLE CARDIOVERTER DEFIBRILLATOR IMPLANT N/A 05/25/2011   Procedure: IMPLANTABLE CARDIOVERTER DEFIBRILLATOR IMPLANT;  Surgeon: Marinus Maw, MD;  Location: [facility] CATH LAB;  Service: Cardiovascular;  Laterality: N/A;  . NEPHRECTOMY  36 yrs ago   Wilms Tumor  . RIGHT HEART CATH N/A 01/29/2019   Procedure: RIGHT HEART CATH;  Surgeon:  Dolores Patty, MD;  Location: [facility] INVASIVE CV LAB;  Service: Cardiovascular;  Laterality: N/A;  . RIGHT HEART CATH N/A 09/08/2019   Procedure: RIGHT HEART CATH;  Surgeon: Dolores Patty, MD;  Location: [facility] INVASIVE CV LAB;  Service: Cardiovascular;  Laterality: N/A;  . RIGHT/LEFT HEART CATH AND CORONARY ANGIOGRAPHY N/A 02/12/2017   Procedure: RIGHT/LEFT HEART CATH AND CORONARY ANGIOGRAPHY;  Surgeon: Dolores Patty, MD;  Location: [facility] INVASIVE CV LAB;  Service: Cardiovascular;  Laterality: N/A;  . ULNAR NERVE TRANSPOSITION Right 01/09/2013   Procedure: ULNAR NERVE DECOMPRESSION/TRANSPOSITION;  Surgeon: Carmela Hurt, MD;  Location: [facility] NEURO ORS;  Service: Neurosurgery;  Laterality: Right;  RIGHT ulnar nerve decompression   Family History  Problem Relation Age of Onset  . Diabetes Mother    Social History   Tobacco Use  . Smoking status: Former Smoker    Packs/day: 0.25    Years: 20.00    Pack years: 5.00    Types: Cigarettes    Quit date: 09/07/2012    Years since quitting: 7.9  . Smokeless tobacco: Current User    Types: Snuff  Vaping Use  . Vaping Use: Never used  Substance Use Topics  . Alcohol use: Yes    Alcohol/week: 0.0 standard drinks    Comment: histoorically a heavy drinker ("beer only"), none for a couple months   . Drug use: No   Current Outpatient Medications  Medication Sig Dispense Refill  . aspirin 81 MG tablet Take 81 mg by mouth daily.    . Blood Glucose Monitoring Suppl (BLOOD GLUCOSE SYSTEM PAK) KIT Please dispense based on patient and insurance preference. Use to monitor FSBS 2-3x weekly. Dx: E11.9. 1 each 1  . carvedilol (COREG) 3.125 MG tablet TAKE 1 TABLET BY MOUTH TWICE DAILY WITH A MEAL 180 tablet 0  . fluticasone (FLONASE) 50 MCG/ACT nasal spray Place 2 sprays into both nostrils daily. 16 g 6  . Glucose Blood (BLOOD GLUCOSE TEST STRIPS) STRP Please dispense based on patient and insurance preference. Use to monitor FSBS 2-3x weekly. Dx: E11.9. 50  each 1  . Lancet Devices MISC Please dispense based on patient and insurance preference. Use to monitor FSBS 2-3x weekly. Dx: E11.9. 1 each 1  . Lancets MISC Please dispense based on patient and insurance preference. Use to monitor FSBS 2-3x weekly. Dx: E11.9. 50 each 1  . levocetirizine (XYZAL) 5 MG tablet TAKE 1 TABLET BY MOUTH ONCE DAILY IN THE EVENING 30 tablet 0  . metolazone (ZAROXOLYN) 2.5 MG tablet TAKE 1 TABLET BY MOUTH AS DIRECTED BY  HEART  FAILURE  CLINIC 15 tablet 0  . Multiple Vitamin (MULTIVITAMIN WITH MINERALS) TABS tablet Take 1 tablet by mouth daily.    Marland Kitchen oxyCODONE (OXY IR/ROXICODONE) 5 MG immediate  release tablet Take 1 tablet (5 mg total) by mouth 3 (three) times daily as needed for severe pain. 60 tablet 0  . potassium chloride SA (KLOR-CON) 20 MEQ tablet Take 1 tablet (20 mEq total) by mouth daily. Take an additional 42mq with Metolazone. 90 tablet 3  . sacubitril-valsartan (ENTRESTO) 49-51 MG Take 1 tablet by mouth 2 (two) times daily. Needs appt for future refills 180 tablet 0  . spironolactone (ALDACTONE) 25 MG tablet Take 1 tablet (25 mg total) by mouth daily. 90 tablet 3  . torsemide (DEMADEX) 20 MG tablet Take 2 tablets by mouth once daily 180 tablet 3   No current facility-administered medications for this visit.   Allergies  Allergen Reactions  . Ibuprofen Hives  . Nsaids     ELEVATED LFT'S     Review of Systems: All systems reviewed and negative except where noted in HPI.   Lab Results  Component Value Date   WBC 5.5 07/11/2020   HGB 15.9 07/11/2020   HCT 46.6 07/11/2020   MCV 94.0 07/11/2020   PLT 274 07/11/2020    Lab Results  Component Value Date   CREATININE 1.31 07/11/2020   BUN 19 07/11/2020   NA 137 07/11/2020   K 4.1 07/11/2020   CL 94 (L) 07/11/2020   CO2 31 07/11/2020    Lab Results  Component Value Date   INR 1.2 (H) 02/18/2020   INR 1.3 (H) 02/24/2019   INR 1.09 02/06/2017     Physical Exam: BP 100/78 (BP Location: Left  Arm, Patient Position: Sitting, Cuff Size: Normal)   Pulse 100   Ht 5' 5.75" (1.67 m) Comment: height measured without shoes  Wt 155 lb 2 oz (70.4 kg)   BMI 25.23 kg/m  Constitutional: Pleasant,well-developed, male in no acute distress. HEENT: Normocephalic and atraumatic. Conjunctivae are normal. No scleral icterus. Neck supple.  Cardiovascular: Normal rate, regular rhythm.  Pulmonary/chest: Effort normal and breath sounds normal.  Abdominal: Soft, nondistended, nontender.  There are no masses palpable. No hepatomegaly. Extremities: no edema Lymphadenopathy: No cervical adenopathy noted. Neurological: Alert and oriented to person place and time. Skin: Skin is warm and dry. No rashes noted. Psychiatric: Normal mood and affect. Behavior is normal.   ASSESSMENT AND PLAN: 50year old male here for reassessment of following:  Cirrhosis - likely due to alcohol plus cardiogenic CHF Colon cancer screening  Severe nonischemic cardiomyopathy as outlined above, [facility]  He has not followed up with him since I have last seen him.  He does continue to drink alcohol periodically and I counseled him that he must completely abstain with these diagnoses or he will not be considered a candidate for transplant.  Further, counseled him that continued alcohol use can increase his risk for further hepatic decompensation.  He has struggled with this in the past but seems to understand the risks of it and states he will quit.  Otherwise, he has been doing really well on his diuretic regimen since of last seen him.  He has no ascites or edema that is appreciable today.  No cardiopulmonary symptoms.  His LFTs are up-to-date and stable.  He is due for AFP and right upper quadrant ultrasound for [place]creening.  Also due for INR.  We will give him the final dose of Twinrix vaccine today.  Otherwise discussed his long-term risk for decompensation and HCC and he  understands, again will work on alcohol abstinence.  Otherwise we discussed colon cancer screening.  He remains at high risk for anesthesia given his low ejection fraction.  He has follow-up with cardiology in 2 weeks for reassessment.  I discussed options for screening with him.  He is asymptomatic and without family history.  Offered him a FIT test initially for screening given his risks for anesthesia with optical colonoscopy.  I discussed this exam with him and he is willing to proceed with it.  Further recommendations pending the results.  Plan: - complete alcohol abstinence - INR and AFP today, repeat basic labs in 6 months - right upper quadrant ultrasound for HCC screening - continue low-sodium diet - Twinrix vaccine today - FIT for colon cancer screening - as long as he remains on Coreg do not need to pursue EGD for varices screening - follow up in the office in 6 months for reassessment  [place] Cellar, MD [facility]

## 2020-08-23 LAB — AFP TUMOR MARKER: AFP-Tumor Marker: 8.3 ng/mL — ABNORMAL HIGH (ref <6.1)

## 2020-08-29 ENCOUNTER — Other Ambulatory Visit: Payer: Self-pay

## 2020-08-29 MED ORDER — OXYCODONE HCL 5 MG PO TABS: 5.0000 mg | ORAL_TABLET | Freq: Three times a day (TID) | ORAL | 0 refills | Status: DC | PRN

## 2020-08-31 ENCOUNTER — Ambulatory Visit (HOSPITAL_COMMUNITY): Admission: RE | Admit: 2020-08-31 | Payer: Medicaid Other | Source: Ambulatory Visit

## 2020-09-04 NOTE — Progress Notes (Signed)
Patient ID: [name], male   DOB: 1970-11-02, 50 y.o.   MRN: [id number]    Patient did not show for visit. Note left for templating purposes only   ADVANCED HF CLINIC NOTE    Primary Cardiologist: Dr Domenic Polite HF MD: Dr Haroldine Laws [name]: Dr Mosetta Pigeon  HPI: Mr [name] is 50 year old with history of NICM (diagnosed 10/2010 - norm cors 2012 and 6/62), chronic systolic heart failure EF 15% (08/2013), S/P ICD [employer], smoker, Wilms tumor s/p nephrectomy at age 57 had chemoradiation, scoliosis.    We saw him at the end of March 2015 and had large recurrent R pleural effusion. Referred to Dr. Prescott Gum who placed Pleurex catheter on 4/10, removed on 09/28/13.   CPX (5/15) with RER 1.06, VO2 max 18.5, VE/VCO2 slope 32.1.  Moderate to severe functional limitation, circulatory.   CPX 09/17/16 Peak VO2: 20.5 (53% predicted peak VO2) Slope 35 Underwent L/RHC on 02/12/17. Had normal coronaries and relatively well-compensated hemodynamics. EF 20%.  Echo 6/20 EF 15% RV mild HK with severe TR Personally reviewed  CPX 8/20 with pVO2 17.1 (down from 20.5)  Slope 35 (stable).   CT abdomen 10/20 c/w cirrhosis.  Paracentesis 03/02/19: 3.7 L off. No need for repeat paracenteses  Has been evaluated at [facility] by Dr Mosetta Pigeon for possible transplant. Seen last in 4/21 - felt too early for transplant but getting closer.  Here for routine f/u      Echo 01/29/19   RA = 17 RV = 46/18 PA = 51/22 (33) PCW = 28 Fick cardiac output/index =4.0/2.2 PVR =1.0 WU Ao sat = 99% PA sat = 62%, 62% RA/PCWP = 0.61 PaPI = 1.71  CPX 01/15/19   FVC 2.85 (57%)      FEV1 2.04 (51%)        FEV1/FVC 72 (89%)        MVV 79 (50%)      Resting HR: 96 Peak HR: 157   (90% age predicted max HR)  BP rest: 92/64 BP peak: 100/58  Peak VO2: 17.7 (47% predicted peak VO2)  VE/VCO2 slope:  35  OUES: 1.30  Peak RER: 1.18  Ventilatory Threshold: 14.0 (37% predicted or measured peak VO2 and 79% measured PVO2)  VE/MVV:   58%  O2pulse:  8   (57% predicted O2pulse)    ECHO 04/2014 EF 10% RV mildly dilated.   ECHO 2/18 EF 15% Mild MR RV mild HK. Severe TR. Triv MR . RV ok Echo 11/2016  EF 15% RV ok. Moderate to severe TR  Cath 02/12/17:  Normal cors  Ao = 91/60 (74) LV = 102/18 RA =  2 RV = 48/5 PA = 48/21 (31) PCW = 20 Fick cardiac output/index = 4.0/2.3 PVR = 2.8 WU Ao sat = 95% PA sat = 68%, 70% RA/PCWP = 0.10 PaPI = 13.5   CPX 09/17/16 Resting HR: 86 Peak HR: 150   (85% age predicted max HR) BP rest: 96/80 BP peak: 118/72 Peak VO2: 20.5 (53% predicted peak VO2) Ve/VCO2 slope:  35 OUES: 1.38 Peak RER: 1.04 Ventilatory Threshold: 15.7 (41% predicted or measured peak VO2) Peak RR 32 Peak Ventilation:  47.5 VE/MVV:  50% PETCO2 at peak:  30 O2pulse:  8   (62% predicted O2pulse)  CPX 5/15 Resting HR: 89 Peak HR: 138   (77% age predicted max HR) BP rest: 106/68 BP peak: 126/74 (IPE) Peak VO2: 18.5 (46.5% predicted peak VO2) VE/VCO2 slope:  32.1 OUES: 1.33 Peak RER: 1.06  Ventilatory Threshold: 13.5 (33.9% predicted peak VO2) Peak RR 31 Peak Ventilation:  37.1 VE/MVV:  62.7% PETCO2 at peak:  33 O2pulse:  7   (58% predicted O2pulse)  SH: Lives with his [name] 48 year old . Unemployed. Disability approved 3 years ago. Does not drink alcohol. Dips tobacco.   FH: Grandfather MI  ROS: All systems negative except as listed in HPI, PMH and Problem List.  Past Medical History:  Diagnosis Date   AICD (automatic cardioverter/defibrillator) present    Arthritis    Cardiomyopathy, nonischemic ([place])    takes Digoxin daily and Carvedilol daily   CHF (congestive heart failure) (HCC)    takes Lasix daily as well Aldactone   Chronic back pain    Chronic systolic heart failure (HCC)    Cirrhosis ([place])    Depression    takes Prozac daily   GERD (gastroesophageal reflux disease)    takes Omeprazole daily   Hx of Alcohol consumption heavy    rare beer currently   hx of Tobacco abuse    quit  smoking 2014   Hyperlipidemia    was on Simvastatin but has been off a year   Insomnia    takes Ambien as needed(just got script yesterday)   Orthostatic hypotension    Scoliosis    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 73 (XRT and chemo)    Current Outpatient Medications  Medication Sig Dispense Refill   aspirin 81 MG tablet Take 81 mg by mouth daily.     Blood Glucose Monitoring Suppl (BLOOD GLUCOSE SYSTEM PAK) KIT Please dispense based on patient and insurance preference. Use to monitor FSBS 2-3x weekly. Dx: E11.9. 1 each 1   carvedilol (COREG) 3.125 MG tablet TAKE 1 TABLET BY MOUTH TWICE DAILY WITH A MEAL 180 tablet 0   fluticasone (FLONASE) 50 MCG/ACT nasal spray Place 2 sprays into both nostrils daily. 16 g 6   Glucose Blood (BLOOD GLUCOSE TEST STRIPS) STRP Please dispense based on patient and insurance preference. Use to monitor FSBS 2-3x weekly. Dx: E11.9. 50 each 1   Lancet Devices MISC Please dispense based on patient and insurance preference. Use to monitor FSBS 2-3x weekly. Dx: E11.9. 1 each 1   Lancets MISC Please dispense based on patient and insurance preference. Use to monitor FSBS 2-3x weekly. Dx: E11.9. 50 each 1   levocetirizine (XYZAL) 5 MG tablet TAKE 1 TABLET BY MOUTH ONCE DAILY IN THE EVENING 30 tablet 0   metolazone (ZAROXOLYN) 2.5 MG tablet TAKE 1 TABLET BY MOUTH AS DIRECTED BY  HEART  FAILURE  CLINIC 15 tablet 0   Multiple Vitamin (MULTIVITAMIN WITH MINERALS) TABS tablet Take 1 tablet by mouth daily.     oxyCODONE (OXY IR/ROXICODONE) 5 MG immediate release tablet Take 1 tablet (5 mg total) by mouth 3 (three) times daily as needed for severe pain. 60 tablet 0   potassium chloride SA (KLOR-CON) 20 MEQ tablet Take 1 tablet (20 mEq total) by mouth daily. Take an additional 14mq with Metolazone. 90 tablet 3   sacubitril-valsartan (ENTRESTO) 49-51 MG Take 1 tablet by mouth 2 (two) times daily. Needs appt for future refills 180 tablet 0   spironolactone (ALDACTONE) 25 MG tablet  Take 1 tablet (25 mg total) by mouth daily. 90 tablet 3   torsemide (DEMADEX) 20 MG tablet Take 2 tablets by mouth once daily 180 tablet 3   No current facility-administered medications for this encounter.     PHYSICAL EXAM: There were no vitals  filed for this visit. There were no vitals filed for this visit. General:  Well appearing. No resp difficulty HEENT: normal Neck: supple. JVP 7 +prominent CV waves. Carotids 2+ bilat; no bruits. No lymphadenopathy or thryomegaly appreciated. Cor: PMI nondisplaced. Regular rate & rhythm. 2/6 TR and MR Lungs: clear Abdomen: soft, nontender, nondistended. No hepatosplenomegaly. No bruits or masses. Good bowel sounds. Extremities: no cyanosis, clubbing, rash, edema Neuro: alert & orientedx3, cranial nerves grossly intact. moves all 4 extremities w/o difficulty. Affect pleasant    ASSESSMENT & PLAN: 1. Chronic Systolic HF:  Nonischemic cardiomyopathy, ECHO 03/2014 EF 10%. Echo 2/18 EF 15% RV mild HK  [employer] ICD.   - Echo 6/20 EF 15% RV normal severe TR - CPX test 8/20 with significant decline form previous.  - Cath 9/18: normal cors. Relatively well-compensated hemodynmaics - W/u has revealed evidence of cirrhosis which I suspect is due to a combination of ETOH and RV failure/severe TR. Synthetic function currently seems ok (Albumin 4.2  INR 1.3 on 9/20) - I doubt TV repair will be sufficient for him so question will be if he will still be a transplant candidate. GI has seen and feels liver disease likely not severe enough to be a barrier to transplant.  May need [facility] team to weigh in as well - He saw Dr. Mosetta Pigeon in 4/21 who felt we would need to proceed with advanced therapies sooner than later in his disease given cirrhosis but still felt he was a bit early. Recommended close f/u with CPX - Symptomatically stable NYHA II - Volume status improved with torsemide 20 bid - Continue entresto to 49/51. Unable to tolerate 97/103 - Failed  titration on carvedilol to 3.125/6.25. Now back on 3.125 bid - Continue spiro 25 daily  - Continue Farxiga 10 - Unable to tolerate digoxin due to heartburn - blood type A-pos  2. Severe TR - Echo with moderate to severe TR but TV seems structurally intact. ? If related to ICD wire or just dilated annulus.  - I doubt TV repair will be sufficient for him so question will be if he will still be a transplant candidate. He has f/u with Dr. Mosetta Pigeon in December to discuss  3. Cirrhosis - w/u as above - appreciate GI input     Patient did not show for visit. Note left for templating purposes only  Glori Bickers, MD  8:54 PM

## 2020-09-05 ENCOUNTER — Inpatient Hospital Stay (HOSPITAL_COMMUNITY)
Admission: RE | Admit: 2020-09-05 | Discharge: 2020-09-05 | Disposition: A | Discharge status: Home / Self Care | Payer: Medicare HMO | Source: Ambulatory Visit | Attending: Internal Medicine | Admitting: Internal Medicine

## 2020-09-09 ENCOUNTER — Other Ambulatory Visit: Payer: Self-pay | Admitting: Family Medicine

## 2020-09-21 ENCOUNTER — Other Ambulatory Visit (HOSPITAL_COMMUNITY): Payer: Self-pay | Admitting: Internal Medicine

## 2020-09-21 DIAGNOSIS — I5022 Chronic systolic (congestive) heart failure: Secondary | ICD-10-CM

## 2020-09-27 ENCOUNTER — Other Ambulatory Visit: Payer: Self-pay | Admitting: *Deleted

## 2020-09-27 ENCOUNTER — Other Ambulatory Visit (HOSPITAL_COMMUNITY): Payer: Self-pay | Admitting: Internal Medicine

## 2020-09-27 NOTE — Telephone Encounter (Signed)
Received call from patient.   Requested refill on Oxycodone.   Ok to refill??  Last office visit 07/11/2020.  Last refill 08/29/2020.  Of note, patient has appointment scheduled on 11/08/2020 to establish with you.

## 2020-09-28 MED ORDER — OXYCODONE HCL 5 MG PO TABS: 5.0000 mg | ORAL_TABLET | Freq: Three times a day (TID) | ORAL | 0 refills | Status: DC | PRN

## 2020-10-17 IMAGING — US ULTRASOUND ABDOMEN COMPLETE
1 series · 14 of 25 positions shown · non-contrast
Comparison: Ultrasound dated 06/30/2015

CLINICAL DATA: Ascites.  Left nephrectomy.

EXAM:
ABDOMEN ULTRASOUND COMPLETE

[Series 1: ultrasound abdomen complete · 0.20mm/px · 14 of 118 slices shown]
[im 1/118]
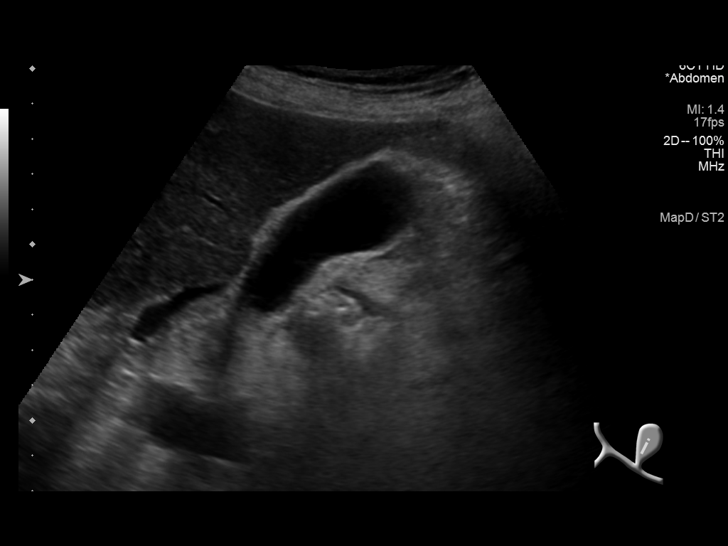
[im 10/118]
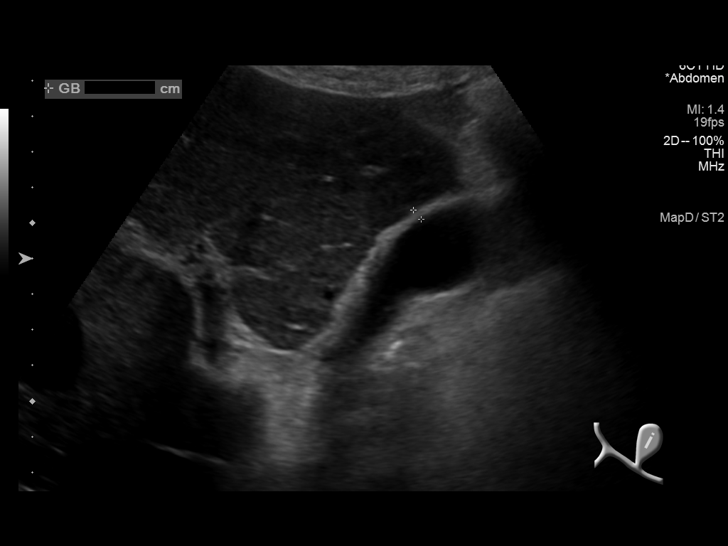
[im 20/118]
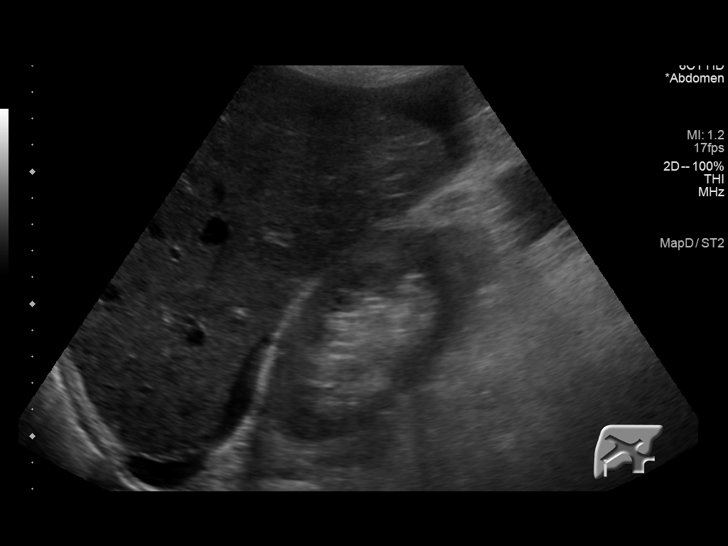
[im 30/118]
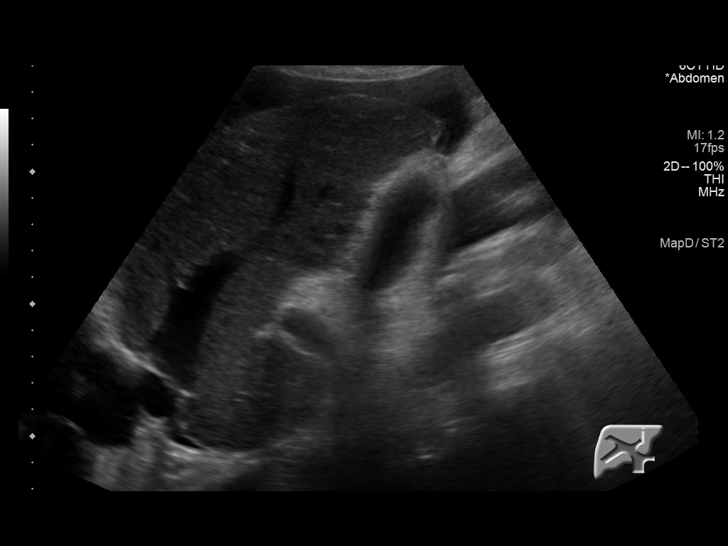
[im 40/118]
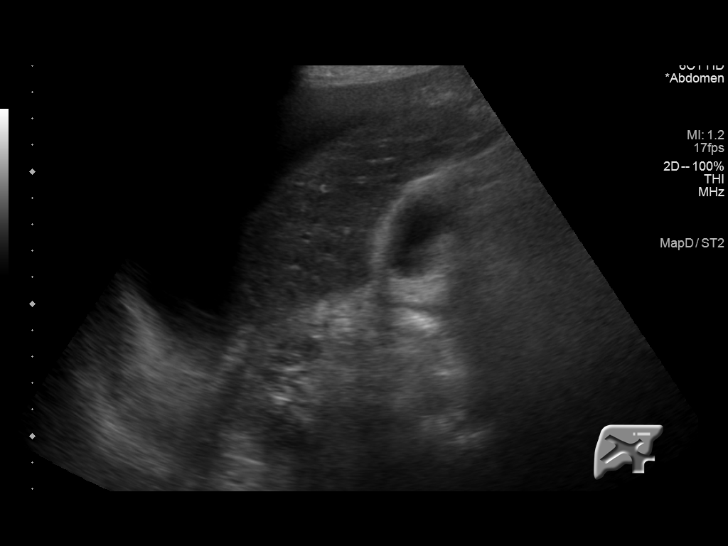
[im 44/118]
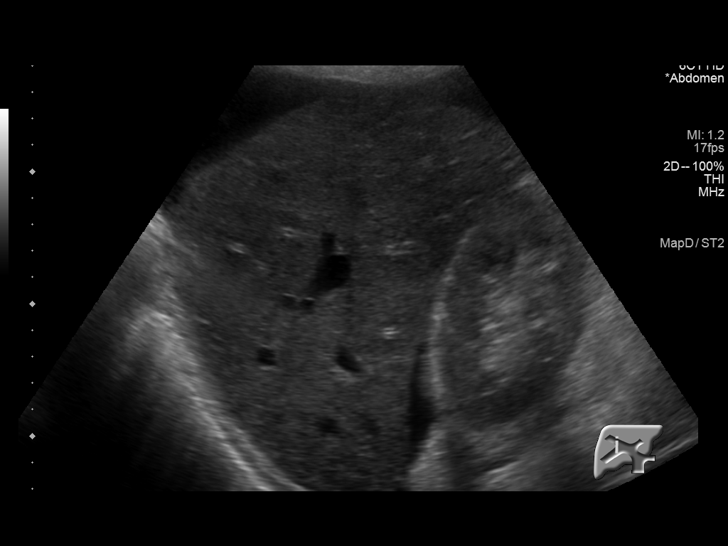
[im 54/118]
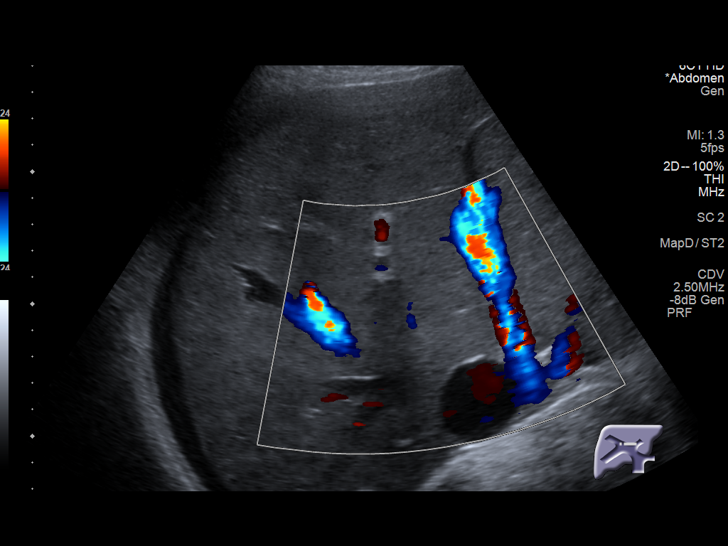
[im 64/118]
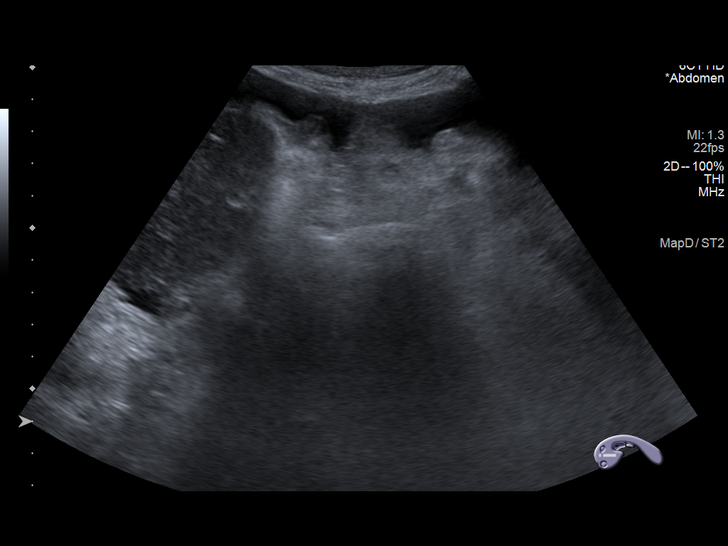
[im 74/118]
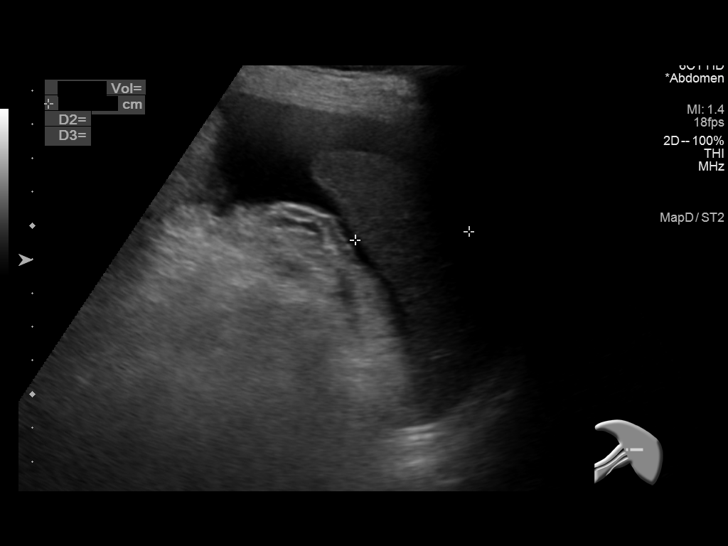
[im 79/118]
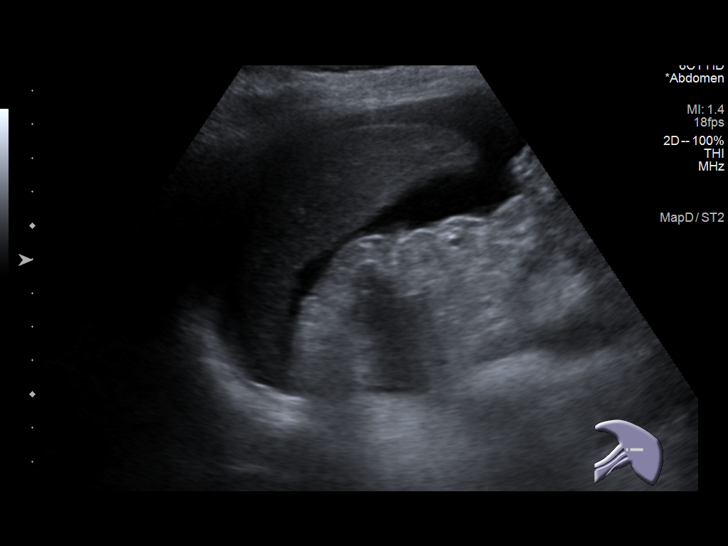
[im 88/118]
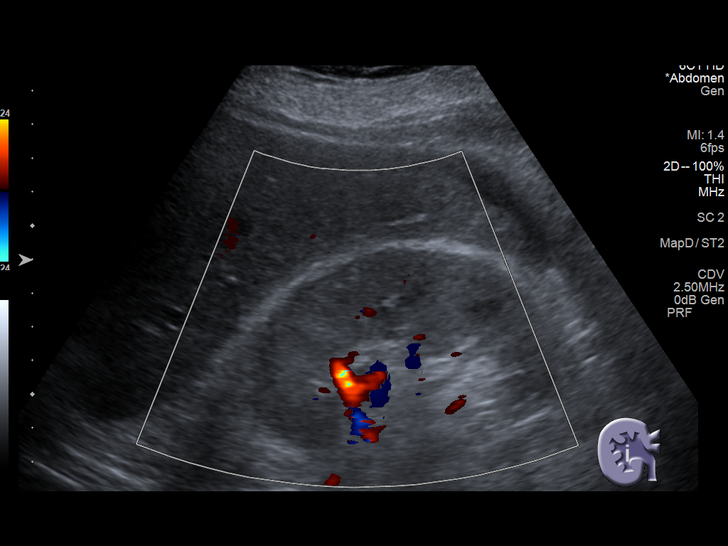
[im 98/118]
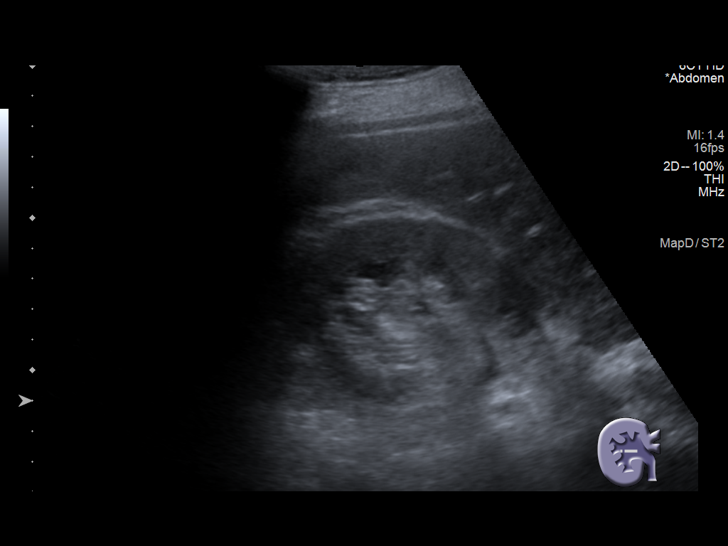
[im 108/118]
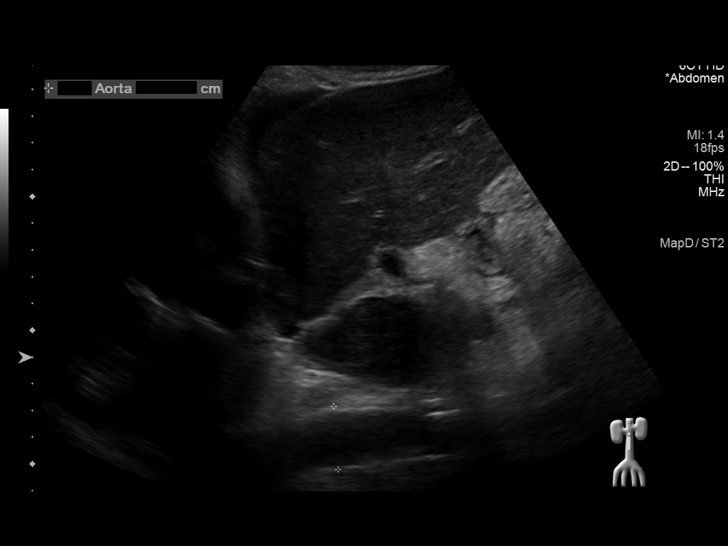
[im 118/118]
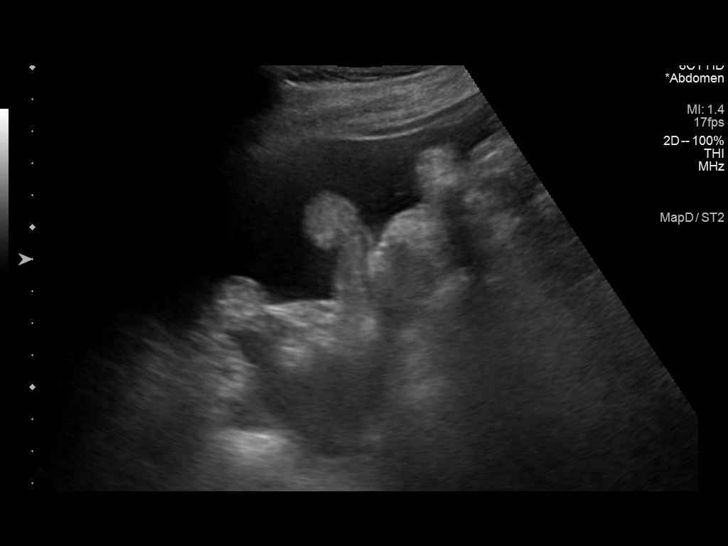

[14 of 25 positions shown; findings below may reference images not displayed]

FINDINGS: Gallbladder: No gallstones or wall thickening visualized. No
sonographic Murphy sign noted by sonographer.

Common bile duct: Diameter: 2.9 mm, normal.

Liver: No focal lesion identified. Slight nodularity of the liver
contour. Heterogeneous echogenicity. Portal vein is patent on color
Doppler imaging with normal direction of blood flow towards the
liver.

IVC: No abnormality visualized.

Pancreas: Not visualized.

Spleen: Size and appearance within normal limits.

Right Kidney: Length: 11.3 cm. Echogenicity within normal limits. No
mass or hydronephrosis visualized.

Left Kidney: Removed

Abdominal aorta: Only the proximal abdominal aorta was visualized
and appears normal. The mid and distal aorta are obscured.

Other findings: Moderate ascites throughout the abdomen.
IMPRESSION: Findings consistent with cirrhosis with ascites. No focal liver
lesions.

## 2020-10-27 ENCOUNTER — Ambulatory Visit (HOSPITAL_COMMUNITY)
Admission: RE | Admit: 2020-10-27 | Discharge: 2020-10-27 | Disposition: A | Discharge status: Home / Self Care | Payer: Medicare HMO | Source: Ambulatory Visit | Attending: Internal Medicine | Admitting: Internal Medicine

## 2020-10-27 ENCOUNTER — Encounter (HOSPITAL_COMMUNITY): Payer: Self-pay | Admitting: Internal Medicine

## 2020-10-27 ENCOUNTER — Other Ambulatory Visit: Payer: Self-pay | Admitting: *Deleted

## 2020-10-27 ENCOUNTER — Other Ambulatory Visit: Payer: Self-pay

## 2020-10-27 VITALS — BP 90/60 | HR 88 | Wt 155.0 lb

## 2020-10-27 DIAGNOSIS — I5022 Chronic systolic (congestive) heart failure: Secondary | ICD-10-CM | POA: Insufficient documentation

## 2020-10-27 DIAGNOSIS — Z560 Unemployment, unspecified: Secondary | ICD-10-CM | POA: Insufficient documentation

## 2020-10-27 DIAGNOSIS — Z923 Personal history of irradiation: Secondary | ICD-10-CM | POA: Diagnosis not present

## 2020-10-27 DIAGNOSIS — I428 Other cardiomyopathies: Secondary | ICD-10-CM | POA: Insufficient documentation

## 2020-10-27 DIAGNOSIS — Z85528 Personal history of other malignant neoplasm of kidney: Secondary | ICD-10-CM | POA: Diagnosis not present

## 2020-10-27 DIAGNOSIS — F17290 Nicotine dependence, other tobacco product, uncomplicated: Secondary | ICD-10-CM | POA: Insufficient documentation

## 2020-10-27 DIAGNOSIS — I071 Rheumatic tricuspid insufficiency: Secondary | ICD-10-CM | POA: Diagnosis not present

## 2020-10-27 DIAGNOSIS — Z79899 Other long term (current) drug therapy: Secondary | ICD-10-CM | POA: Insufficient documentation

## 2020-10-27 DIAGNOSIS — Z7982 Long term (current) use of aspirin: Secondary | ICD-10-CM | POA: Insufficient documentation

## 2020-10-27 DIAGNOSIS — Z905 Acquired absence of kidney: Secondary | ICD-10-CM | POA: Insufficient documentation

## 2020-10-27 DIAGNOSIS — Z8249 Family history of ischemic heart disease and other diseases of the circulatory system: Secondary | ICD-10-CM | POA: Diagnosis not present

## 2020-10-27 DIAGNOSIS — K7460 Unspecified cirrhosis of liver: Secondary | ICD-10-CM | POA: Diagnosis not present

## 2020-10-27 DIAGNOSIS — Z95810 Presence of automatic (implantable) cardiac defibrillator: Secondary | ICD-10-CM | POA: Diagnosis not present

## 2020-10-27 DIAGNOSIS — R69 Illness, unspecified: Secondary | ICD-10-CM | POA: Diagnosis not present

## 2020-10-27 LAB — CBC
HCT: 43.5 % (ref 39.0–52.0)
Hemoglobin: 14.7 g/dL (ref 13.0–17.0)
MCH: 32.2 pg (ref 26.0–34.0)
MCHC: 33.8 g/dL (ref 30.0–36.0)
MCV: 95.4 fL (ref 80.0–100.0)
Platelets: 242 10*3/uL (ref 150–400)
RBC: 4.56 MIL/uL (ref 4.22–5.81)
RDW: 15 % (ref 11.5–15.5)
WBC: 4.9 10*3/uL (ref 4.0–10.5)
nRBC: 0 % (ref 0.0–0.2)

## 2020-10-27 LAB — COMPREHENSIVE METABOLIC PANEL
ALT: 24 U/L (ref 0–44)
AST: 35 U/L (ref 15–41)
Albumin: 3.5 g/dL (ref 3.5–5.0)
Alkaline Phosphatase: 154 U/L — ABNORMAL HIGH (ref 38–126)
Anion gap: 8 (ref 5–15)
BUN: 10 mg/dL (ref 6–20)
CO2: 31 mmol/L (ref 22–32)
Calcium: 9 mg/dL (ref 8.9–10.3)
Chloride: 94 mmol/L — ABNORMAL LOW (ref 98–111)
Creatinine, Ser: 1.24 mg/dL (ref 0.61–1.24)
GFR, Estimated: >60 mL/min (ref >60)
Glucose, Bld: 141 mg/dL — ABNORMAL HIGH (ref 70–99)
Potassium: 3.5 mmol/L (ref 3.5–5.1)
Sodium: 133 mmol/L — ABNORMAL LOW (ref 135–145)
Total Bilirubin: 1.1 mg/dL (ref 0.3–1.2)
Total Protein: 6.8 g/dL (ref 6.5–8.1)

## 2020-10-27 LAB — BRAIN NATRIURETIC PEPTIDE: B Natriuretic Peptide: 3430.6 pg/mL — ABNORMAL HIGH (ref 0.0–100.0)

## 2020-10-27 MED ORDER — TORSEMIDE 20 MG PO TABS: 40.0000 mg | ORAL_TABLET | Freq: Every day | ORAL | 11 refills | Status: DC

## 2020-10-27 NOTE — Patient Instructions (Signed)
Labs done today, we will call you for abnormal results  Your physician has requested that you have an echocardiogram. Echocardiography is a painless test that uses sound waves to create images of your heart. It provides your doctor with information about the size and shape of your heart and how well your heart's chambers and valves are working. This procedure takes approximately one hour. There are no restrictions for this procedure.  Your physician has recommended that you have a cardiopulmonary stress test (CPX). CPX testing is a non-invasive measurement of heart and lung function. It replaces a traditional treadmill stress test. This type of test provides a tremendous amount of information that relates not only to your present condition but also for future outcomes. This test combines measurements of you ventilation, respiratory gas exchange in the lungs, electrocardiogram (EKG), blood pressure and physical response before, during, and following an exercise protocol.  Please call our office in October to schedule your follow up appointment  If you have any questions or concerns before your next appointment please send [place] a message through [place] or call our office at [phone].    TO LEAVE A MESSAGE FOR THE NURSE SELECT OPTION 2, PLEASE LEAVE A MESSAGE INCLUDING: . YOUR NAME . DATE OF BIRTH . CALL BACK NUMBER . REASON FOR CALL**this is important as we prioritize the call backs  [place] AS LONG AS YOU CALL BEFORE 4:00 PM  At the [facility], you and your health needs are our priority. As part of our continuing mission to provide you with exceptional heart care, we have created designated Provider Care Teams. These Care Teams include your primary Cardiologist (physician) and Advanced Practice Providers (APPs- Physician Assistants and Nurse Practitioners) who all work together to provide you with the care you need, when you need it.   You  may see any of the following providers on your designated Care Team at your next follow up: Marland Kitchen Dr Glori Bickers . Dr Loralie Champagne . Dr Vickki Muff . Darrick Grinder, NP . Lyda Jester, [place] . Audry Riles, PharmD   Please be sure to bring in all your medications bottles to every appointment.

## 2020-10-27 NOTE — Telephone Encounter (Signed)
Received call from patient.   Requested refill on Oxycodone.   Ok to refill??  Last office visit 07/11/2020.  Last refill 09/28/2020.  Of note, patient has appointment scheduled on 11/08/2020 to establish with you.

## 2020-10-27 NOTE — Addendum Note (Signed)
Encounter addended by: Scarlette Calico, RN on: 10/27/2020 3:22 PM  Actions taken: Order list changed, Diagnosis association updated, Clinical Note Signed, Charge Capture section accepted

## 2020-10-27 NOTE — Progress Notes (Signed)
Patient ID: [name], male   DOB: 1970-12-16, 50 y.o.   MRN: [id number]   ADVANCED HF CLINIC NOTE    Primary Cardiologist: Dr Domenic Polite HF MD: Dr Haroldine Laws [name]: Dr Mosetta Pigeon  HPI: Mr [name] is 50 year old with history of NICM (diagnosed 10/2010 - norm cors 2012 and 8/18), chronic systolic heart failure EF 15% (08/2013), S/P ICD [employer], smoker, Wilms tumor s/p nephrectomy at age 56 had chemoradiation, scoliosis.    We saw him at the end of March 2015 and had large recurrent R pleural effusion. Referred to Dr. Prescott Gum who placed Pleurex catheter on 4/10, removed on 09/28/13.   CPX (5/15) with RER 1.06, VO2 max 18.5, VE/VCO2 slope 32.1.  Moderate to severe functional limitation, circulatory.   CPX 09/17/16 Peak VO2: 20.5 (53% predicted peak VO2) Slope 35 Underwent L/RHC on 02/12/17. Had normal coronaries and relatively well-compensated hemodynamics. EF 20%.  Echo 6/20 EF 15% RV mild HK with severe TR Personally reviewed  CPX 8/20 with pVO2 17.1 (down from 20.5)  Slope 35 (stable).   CT abdomen 10/20 c/w cirrhosis.  Paracentesis 03/02/19: 3.7 L off. No need for repeat paracenteses  Has been evaluated at [facility] by Dr Mosetta Pigeon for possible transplant. Seen last in 4/21 - felt too early for transplant but getting closer.  Here for routine f/u. Feels good. Out doing whatever he wants. Denies SOB, CP, edema, orthopnea or PND. Taking all meds as prescribed.    Echo 01/29/19   RA = 17 RV = 46/18 PA = 51/22 (33) PCW = 28 Fick cardiac output/index =4.0/2.2 PVR =1.0 WU Ao sat = 99% PA sat = 62%, 62% RA/PCWP = 0.61 PaPI = 1.71  CPX 01/15/19   FVC 2.85 (57%)      FEV1 2.04 (51%)        FEV1/FVC 72 (89%)        MVV 79 (50%)      Resting HR: 96 Peak HR: 157   (90% age predicted max HR)  BP rest: 92/64 BP peak: 100/58  Peak VO2: 17.7 (47% predicted peak VO2)  VE/VCO2 slope:  35  OUES: 1.30  Peak RER: 1.18  Ventilatory Threshold: 14.0 (37% predicted or measured peak VO2 and  79% measured PVO2)  VE/MVV:  58%  O2pulse:  8   (57% predicted O2pulse)    ECHO 04/2014 EF 10% RV mildly dilated.   ECHO 2/18 EF 15% Mild MR RV mild HK. Severe TR. Triv MR . RV ok Echo 11/2016  EF 15% RV ok. Moderate to severe TR  Cath 02/12/17:  Normal cors  Ao = 91/60 (74) LV = 102/18 RA =  2 RV = 48/5 PA = 48/21 (31) PCW = 20 Fick cardiac output/index = 4.0/2.3 PVR = 2.8 WU Ao sat = 95% PA sat = 68%, 70% RA/PCWP = 0.10 PaPI = 13.5   CPX 09/17/16 Resting HR: 86 Peak HR: 150   (85% age predicted max HR) BP rest: 96/80 BP peak: 118/72 Peak VO2: 20.5 (53% predicted peak VO2) Ve/VCO2 slope:  35 OUES: 1.38 Peak RER: 1.04 Ventilatory Threshold: 15.7 (41% predicted or measured peak VO2) Peak RR 32 Peak Ventilation:  47.5 VE/MVV:  50% PETCO2 at peak:  30 O2pulse:  8   (62% predicted O2pulse)  CPX 5/15 Resting HR: 89 Peak HR: 138   (77% age predicted max HR) BP rest: 106/68 BP peak: 126/74 (IPE) Peak VO2: 18.5 (46.5% predicted peak VO2) VE/VCO2 slope:  32.1 OUES: 1.33 Peak  RER: 1.06 Ventilatory Threshold: 13.5 (33.9% predicted peak VO2) Peak RR 31 Peak Ventilation:  37.1 VE/MVV:  62.7% PETCO2 at peak:  33 O2pulse:  7   (58% predicted O2pulse)  SH: Lives with his son 68 year old . Unemployed. Disability approved 3 years ago. Does not drink alcohol. Dips tobacco.   FH: Grandfather MI  ROS: All systems negative except as listed in HPI, PMH and Problem List.  Past Medical History:  Diagnosis Date  . AICD (automatic cardioverter/defibrillator) present   . Arthritis   . Cardiomyopathy, nonischemic (HCC)    takes Digoxin daily and Carvedilol daily  . CHF (congestive heart failure) (HCC)    takes Lasix daily as well Aldactone  . Chronic back pain   . Chronic systolic heart failure ([place])   . Cirrhosis ([place])   . Depression    takes Prozac daily  . GERD (gastroesophageal reflux disease)    takes Omeprazole daily  . Hx of Alcohol consumption heavy    rare beer  currently  . hx of Tobacco abuse    quit smoking 2014  . Hyperlipidemia    was on Simvastatin but has been off a year  . Insomnia    takes Ambien as needed(just got script yesterday)  . Orthostatic hypotension   . Scoliosis   . 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 64 (XRT and chemo)    Current Outpatient Medications  Medication Sig Dispense Refill  . aspirin 81 MG tablet Take 81 mg by mouth daily.    . Blood Glucose Monitoring Suppl (BLOOD GLUCOSE SYSTEM PAK) KIT Please dispense based on patient and insurance preference. Use to monitor FSBS 2-3x weekly. Dx: E11.9. 1 each 1  . carvedilol (COREG) 3.125 MG tablet TAKE 1 TABLET BY MOUTH TWICE DAILY WITH A MEAL 180 tablet 0  . fluticasone (FLONASE) 50 MCG/ACT nasal spray Place 2 sprays into both nostrils daily. 16 g 6  . Glucose Blood (BLOOD GLUCOSE TEST STRIPS) STRP Please dispense based on patient and insurance preference. Use to monitor FSBS 2-3x weekly. Dx: E11.9. 50 each 1  . Lancet Devices MISC Please dispense based on patient and insurance preference. Use to monitor FSBS 2-3x weekly. Dx: E11.9. 1 each 1  . levocetirizine (XYZAL) 5 MG tablet TAKE 1 TABLET BY MOUTH ONCE DAILY IN THE EVENING 30 tablet 0  . metolazone (ZAROXOLYN) 2.5 MG tablet TAKE 1 TABLET BY MOUTH AS DIRECTED BY  HEART  FAILURE  CLINIC 15 tablet 0  . Multiple Vitamin (MULTIVITAMIN WITH MINERALS) TABS tablet Take 1 tablet by mouth daily.    Marland Kitchen oxyCODONE (OXY IR/ROXICODONE) 5 MG immediate release tablet Take 1 tablet (5 mg total) by mouth 3 (three) times daily as needed for severe pain. 60 tablet 0  . potassium chloride SA (KLOR-CON) 20 MEQ tablet Take 1 tablet (20 mEq total) by mouth daily. Take an additional 28mq with Metolazone. 90 tablet 3  . sacubitril-valsartan (ENTRESTO) 49-51 MG Take 1 tablet by mouth 2 (two) times daily. Needs appt for future refills 180 tablet 0  . spironolactone (ALDACTONE) 25 MG tablet Take 1 tablet (25 mg total) by mouth daily. 90 tablet 3  .  torsemide (DEMADEX) 20 MG tablet Take 2 tablets by mouth once daily 60 tablet 0   No current facility-administered medications for this encounter.     PHYSICAL EXAM: Vitals:   10/27/20 1430  BP: 90/60  Pulse: 88  SpO2: 97%  Weight: 70.3 kg (155 lb)   FAutoliv  10/27/20 1430  Weight: 70.3 kg (155 lb)   General:  Well appearing. No resp difficulty HEENT: normal Neck: supple. JVP 5-6. Carotids 2+ bilat; no bruits. No lymphadenopathy or thryomegaly appreciated. Cor: PMI nondisplaced. Regular rate & rhythm. 2/6 MR/TR Lungs: clear Abdomen: soft, nontender, nondistended. No hepatosplenomegaly. No bruits or masses. Good bowel sounds. Extremities: no cyanosis, clubbing, rash, tr edema Neuro: alert & orientedx3, cranial nerves grossly intact. moves all 4 extremities w/o difficulty. Affect pleasant  NSR 87 +LVH Personally reviewed  ICD interrogation:  No VT/AF. Activity level 2.3hr/day Personally reviewed  ASSESSMENT & PLAN: 1. Chronic Systolic HF:  Nonischemic cardiomyopathy, ECHO 03/2014 EF 10%. Echo 2/18 EF 15% RV mild HK  [employer] ICD.   - Echo 6/20 EF 15% RV normal severe TR - CPX test 8/20 with significant decline form previous.  - Cath 9/18: normal cors. Relatively well-compensated hemodynmaics - W/u has revealed evidence of cirrhosis which I suspect is due to a combination of ETOH and RV failure/severe TR. Synthetic function currently seems ok (Albumin 4.2  INR 1.3 on 9/20) - I doubt TV repair will be sufficient for him so question will be if he will still be a transplant candidate. GI has seen and feels liver disease likely not severe enough to be a barrier to transplant.  May need [facility] team to weigh in as well - He saw Dr. Mosetta Pigeon in 4/21 who felt we would need to proceed with advanced therapies sooner than later in his disease given cirrhosis but still felt he was a bit early. Recommended close f/u with CPX - Symptomatically stable NYHA II - Volume status  ok on torsemide 20 bid - Continue entresto to 49/51. Unable to tolerate 97/103 - Failed titration on carvedilol to 3.125/6.25. Now back on 3.125 bid - Continue spiro 25 daily  - Continue Farxiga 10 - Unable to tolerate digoxin due to heartburn - blood type A-pos - Doing fairly well. Will repeat echo and CPX testing to reassess   2. Severe TR - Echo with moderate to severe TR but TV seems structurally intact. ? If related to ICD wire or just dilated annulus.  - I doubt TV repair will be sufficient for him so question will be if he will still be a transplant candidate.  3. Cirrhosis - w/u as above - appreciate GI input   Glori Bickers, MD  3:03 PM

## 2020-10-28 ENCOUNTER — Telehealth (HOSPITAL_COMMUNITY): Payer: Self-pay

## 2020-10-28 MED ORDER — OXYCODONE HCL 5 MG PO TABS: 5.0000 mg | ORAL_TABLET | Freq: Three times a day (TID) | ORAL | 0 refills | Status: DC | PRN

## 2020-10-28 NOTE — Telephone Encounter (Signed)
[place] alert received for 2 VT events, 1 requiring ATP (06/26/20). No EGM's available per [facility] rep. Transmission was a consult and those EGM's only save for 72 hours. Patients remote monitor is not connecting d/t no Internet per patient. Patient has in-clinic apt. With Dr. Lovena Le on 11/09/20 and [place] will be present to give patient new monitor. Patient agreeable to plan.  [place] driving restrictions x6 months from 06/26/20 discussed with patient with verbal understanding. Shock plan reviewed.

## 2020-10-31 NOTE — Telephone Encounter (Signed)
Noted.GT 

## 2020-11-08 ENCOUNTER — Other Ambulatory Visit: Payer: Self-pay

## 2020-11-08 ENCOUNTER — Encounter: Payer: Self-pay | Admitting: Family Medicine

## 2020-11-08 ENCOUNTER — Ambulatory Visit (INDEPENDENT_AMBULATORY_CARE_PROVIDER_SITE_OTHER): Payer: Medicare HMO | Admitting: Family Medicine

## 2020-11-08 VITALS — BP 102/64 | HR 62 | Temp 98.3°F | Resp 14 | Ht 65.5 in | Wt 152.0 lb

## 2020-11-08 DIAGNOSIS — E1169 Type 2 diabetes mellitus with other specified complication: Secondary | ICD-10-CM

## 2020-11-08 DIAGNOSIS — I5022 Chronic systolic (congestive) heart failure: Secondary | ICD-10-CM | POA: Diagnosis not present

## 2020-11-08 DIAGNOSIS — Z8781 Personal history of (healed) traumatic fracture: Secondary | ICD-10-CM | POA: Diagnosis not present

## 2020-11-08 DIAGNOSIS — G894 Chronic pain syndrome: Secondary | ICD-10-CM

## 2020-11-08 MED ORDER — SPIRONOLACTONE 25 MG PO TABS: 25.0000 mg | ORAL_TABLET | Freq: Every day | ORAL | 3 refills | Status: DC

## 2020-11-08 NOTE — Progress Notes (Signed)
Subjective:    Patient ID: [name], male    DOB: 16-Jan-1971, 50 y.o.   MRN: [id number]  HPI  Patient is a 50 year old Caucasian male who previously was seen my partner who has since retired.  Past medical history significant for systolic congestive heart failure.  Ejection fraction on echocardiogram in 2020 was 15%.  He is in the advanced heart failure clinic under the care of Dr. Missy Sabins.  Patient is currently on a combination of Entresto, spironolactone, and carvedilol.  He also takes torsemide on a daily basis to maintain euvolemia.  Today he denies any shortness of breath.  He denies any peripheral edema.  He denies any chest pain.  He states that he is able to do pretty much what he wants as long as he paces himself.  For instance he still splits firewood using a log splitter.  He lifts the blanks of wood onto the splitter.  I am quite impressed by that.  He denies any dyspnea with regular activity such as walking around and performing activities of daily living.  He is no longer smoking.  He states that he rarely drinks alcohol.  His chart does have a history of alcohol abuse.  He states that he rarely drinks.  He also has a history of type 2 diabetes mellitus that is currently being managed with diet.  He is not regularly checking his blood sugars.  He denies any polyuria, polydipsia, blurry vision. Past Medical History:  Diagnosis Date  . AICD (automatic cardioverter/defibrillator) present   . Arthritis   . Cardiomyopathy, nonischemic (HCC)    takes Digoxin daily and Carvedilol daily  . CHF (congestive heart failure) (HCC)    takes Lasix daily as well Aldactone  . Chronic back pain   . Chronic systolic heart failure ([place])   . Cirrhosis ([place])   . Depression    takes Prozac daily  . GERD (gastroesophageal reflux disease)    takes Omeprazole daily  . Hx of Alcohol consumption heavy    rare beer currently  . hx of Tobacco abuse    quit smoking 2014  . Hyperlipidemia    was  on Simvastatin but has been off a year  . Insomnia    takes Ambien as needed(just got script yesterday)  . Orthostatic hypotension   . Scoliosis   . 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 77 (XRT and chemo)   Past Surgical History:  Procedure Laterality Date  . CARPAL TUNNEL RELEASE Right 01/09/2013   Procedure: CARPAL TUNNEL RELEASE;  Surgeon: Winfield Cunas, MD;  Location: [place] NEURO ORS;  Service: Neurosurgery;  Laterality: Right;  RIGHT carpal tunnel release  . CARPAL TUNNEL RELEASE Left 01/30/2013   Procedure: LEFT CARPAL TUNNEL RELEASE;  Surgeon: Winfield Cunas, MD;  Location: [place] NEURO ORS;  Service: Neurosurgery;  Laterality: Left;  LEFT Carpal Tunnel release  . CHEST TUBE INSERTION Right 09/09/2013   Procedure: INSERTION PLEURAL DRAINAGE CATHETER;  Surgeon: Ivin Poot, MD;  Location: [facility];  Service: Thoracic;  Laterality: Right;  . HYDROCELE EXCISION Right 04/11/2017   Procedure: RIGHT HYDROCELECTOMY ADULT;  Surgeon: Irine Seal, MD;  Location: [facility] ORS;  Service: Urology;  Laterality: Right;  . hydrocelectomy  2008  . ICD  05/25/2011   [employer] Endotak Reliance SG lead/Energen single chamber device  . IMPLANTABLE CARDIOVERTER DEFIBRILLATOR IMPLANT N/A 05/25/2011   Procedure: IMPLANTABLE CARDIOVERTER DEFIBRILLATOR IMPLANT;  Surgeon: Evans Lance, MD;  [facility]: [facility] CATH LAB;  Service:  Cardiovascular;  Laterality: N/A;  . NEPHRECTOMY  36 yrs ago   Wilms Tumor  . RIGHT HEART CATH N/A 01/29/2019   Procedure: RIGHT HEART CATH;  Surgeon: Jolaine Artist, MD;  Location: [place] CV LAB;  Service: Cardiovascular;  Laterality: N/A;  . RIGHT HEART CATH N/A 09/08/2019   Procedure: RIGHT HEART CATH;  Surgeon: Jolaine Artist, MD;  Location: [place] CV LAB;  Service: Cardiovascular;  Laterality: N/A;  . RIGHT/LEFT HEART CATH AND CORONARY ANGIOGRAPHY N/A 02/12/2017   Procedure: RIGHT/LEFT HEART CATH AND CORONARY ANGIOGRAPHY;  Surgeon: Jolaine Artist, MD;  Location: [place]  CV LAB;  Service: Cardiovascular;  Laterality: N/A;  . ULNAR NERVE TRANSPOSITION Right 01/09/2013   Procedure: ULNAR NERVE DECOMPRESSION/TRANSPOSITION;  Surgeon: Winfield Cunas, MD;  Location: [place] NEURO ORS;  Service: Neurosurgery;  Laterality: Right;  RIGHT ulnar nerve decompression   Current Outpatient Medications on File Prior to Visit  Medication Sig Dispense Refill  . aspirin 81 MG tablet Take 81 mg by mouth daily.    . Blood Glucose Monitoring Suppl (BLOOD GLUCOSE SYSTEM PAK) KIT Please dispense based on patient and insurance preference. Use to monitor FSBS 2-3x weekly. Dx: E11.9. 1 each 1  . carvedilol (COREG) 3.125 MG tablet TAKE 1 TABLET BY MOUTH TWICE DAILY WITH A MEAL 180 tablet 0  . fluticasone (FLONASE) 50 MCG/ACT nasal spray Place 2 sprays into both nostrils daily. 16 g 6  . Glucose Blood (BLOOD GLUCOSE TEST STRIPS) STRP Please dispense based on patient and insurance preference. Use to monitor FSBS 2-3x weekly. Dx: E11.9. 50 each 1  . Lancet Devices MISC Please dispense based on patient and insurance preference. Use to monitor FSBS 2-3x weekly. Dx: E11.9. 1 each 1  . levocetirizine (XYZAL) 5 MG tablet TAKE 1 TABLET BY MOUTH ONCE DAILY IN THE EVENING 30 tablet 0  . metolazone (ZAROXOLYN) 2.5 MG tablet TAKE 1 TABLET BY MOUTH AS DIRECTED BY  HEART  FAILURE  CLINIC 15 tablet 0  . Multiple Vitamin (MULTIVITAMIN WITH MINERALS) TABS tablet Take 1 tablet by mouth daily.    Marland Kitchen oxyCODONE (OXY IR/ROXICODONE) 5 MG immediate release tablet Take 1 tablet (5 mg total) by mouth 3 (three) times daily as needed for severe pain. 60 tablet 0  . potassium chloride SA (KLOR-CON) 20 MEQ tablet Take 1 tablet (20 mEq total) by mouth daily. Take an additional 24mq with Metolazone. 90 tablet 3  . sacubitril-valsartan (ENTRESTO) 49-51 MG Take 1 tablet by mouth 2 (two) times daily. Needs appt for future refills 180 tablet 0  . torsemide (DEMADEX) 20 MG tablet Take 2 tablets (40 mg total) by mouth daily. 60 tablet 11    No current facility-administered medications on file prior to visit.   Allergies  Allergen Reactions  . Ibuprofen Hives  . Nsaids     ELEVATED LFT'S   Social History   Socioeconomic History  . Marital status: Single    Spouse name: Not on file  . Number of children: 1  . Years of education: Not on file  . Highest education level: Not on file  Occupational History  . Occupation: Full time    Comment: MEngineer, civil (consulting) Tobacco Use  . Smoking status: Former Smoker    Packs/day: 0.25    Years: 20.00    Pack years: 5.00    Types: Cigarettes    Quit date: 09/07/2012    Years since quitting: 8.1  . Smokeless tobacco: Current User    Types:  Snuff  Vaping Use  . Vaping Use: Never used  Substance and Sexual Activity  . Alcohol use: Yes    Alcohol/week: 0.0 standard drinks    Comment: histoorically a heavy drinker ("beer only"), none for a couple months   . Drug use: No  . Sexual activity: Not Currently  Other Topics Concern  . Not on file  Social History Narrative  . Not on file   Social Determinants of Health   Financial Resource Strain: Not on file  Food Insecurity: Not on file  Transportation Needs: Not on file  Physical Activity: Not on file  Stress: Not on file  Social Connections: Not on file  Intimate Partner Violence: Not on file     Review of Systems  All other systems reviewed and are negative.      Objective:   Physical Exam Vitals reviewed.  Constitutional:      Appearance: Normal appearance. He is normal weight.  Cardiovascular:     Rate and Rhythm: Normal rate and regular rhythm.     Heart sounds: Normal heart sounds. No murmur heard.   Pulmonary:     Effort: Pulmonary effort is normal. No respiratory distress.     Breath sounds: Normal breath sounds. No wheezing or rhonchi.  Abdominal:     General: Bowel sounds are normal.     Palpations: Abdomen is soft.  Musculoskeletal:     Right lower leg: No edema.     Left lower leg: No edema.   Neurological:     Mental Status: He is alert.           Assessment & Plan:  Type 2 diabetes mellitus with other specified complication, without long-term current use of insulin (HCC) - Plan: Microalbumin, urine, Hemoglobin A1c, Lipid panel  Chronic systolic congestive heart failure (HCC)  H/O compression fracture of spine  Chronic pain syndrome  He also has chronic back pain.  When he was 50 years old he was diagnosed with Wilms tumor.  He received radiation therapy.  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  This is caused him to have chronic lower back pain.  He has to take oxycodone 1-2 times a day for pain.  Check a hemoglobin A1c.  Goal hemoglobin A1c is less than 6.5.  Also check a fasting lipid panel.  Goal LDL cholesterol is less than 100.  His last CMP was checked just a week or so ago by his cardiologist.  Blood pressure today is excellent.  Advise complete abstinence from alcohol and tobacco

## 2020-11-09 ENCOUNTER — Ambulatory Visit (INDEPENDENT_AMBULATORY_CARE_PROVIDER_SITE_OTHER): Payer: Medicare HMO | Admitting: Internal Medicine

## 2020-11-09 ENCOUNTER — Encounter: Payer: Self-pay | Admitting: Internal Medicine

## 2020-11-09 VITALS — BP 110/68 | HR 98 | Ht 69.0 in | Wt 155.2 lb

## 2020-11-09 DIAGNOSIS — I5022 Chronic systolic (congestive) heart failure: Secondary | ICD-10-CM

## 2020-11-09 LAB — MICROALBUMIN, URINE: Microalb, Ur: 2.1 mg/dL

## 2020-11-09 LAB — LIPID PANEL
Cholesterol: 181 mg/dL (ref <200)
HDL: 55 mg/dL (ref >=40)
LDL Cholesterol (Calc): 110 mg/dL (calc) — ABNORMAL HIGH
Non-HDL Cholesterol (Calc): 126 mg/dL (calc) (ref <130)
Total CHOL/HDL Ratio: 3.3 (calc) (ref <5.0)
Triglycerides: 74 mg/dL (ref <150)

## 2020-11-09 LAB — HEMOGLOBIN A1C
Hgb A1c MFr Bld: 6.7 % of total Hgb — ABNORMAL HIGH (ref <5.7)
Mean Plasma Glucose: 146 mg/dL
eAG (mmol/L): 8.1 mmol/L

## 2020-11-09 NOTE — Patient Instructions (Signed)
Medication Instructions:  Your physician recommends that you continue on your current medications as directed. Please refer to the Current Medication list given to you today.  *If you need a refill on your cardiac medications before your next appointment, please call your pharmacy*   Lab Work: None today  If you have labs (blood work) drawn today and your tests are completely normal, you will receive your results only by: . MyChart Message (if you have MyChart) OR . A paper copy in the mail If you have any lab test that is abnormal or we need to change your treatment, we will call you to review the results.   Testing/Procedures: None today    Follow-Up: At [facility], you and your health needs are our priority.  As part of our continuing mission to provide you with exceptional heart care, we have created designated Provider Care Teams.  These Care Teams include your primary Cardiologist (physician) and Advanced Practice Providers (APPs -  Physician Assistants and Nurse Practitioners) who all work together to provide you with the care you need, when you need it.  We recommend signing up for the patient portal called "MyChart".  Sign up information is provided on this After Visit Summary.  MyChart is used to connect with patients for Virtual Visits (Telemedicine).  Patients are able to view lab/test results, encounter notes, upcoming appointments, etc.  Non-urgent messages can be sent to your provider as well.   To learn more about what you can do with MyChart, go to [email or web address].    Your next appointment:   12 month(s)  The format for your next appointment:   In Person  Provider:   Gregg Taylor, MD   Other Instructions  None      

## 2020-11-09 NOTE — Progress Notes (Signed)
HPI Mr. Yeatts returns today for folllowup. He is a pleasant 50 yo man with a h/o chronic systolic heart failure, s/p ICD insertion, cirrhosis, TR and on maximal medical therapy. He has class 2 symptoms. He has not had chest pain or edema. His cirrhosis is thought due in part to TR but perhaps ETOH as well. He has had a single episode of VT about 5 months ago. He had fallen on the ice. He has not had any more falls. Allergies  Allergen Reactions  . Ibuprofen Hives  . Nsaids     ELEVATED LFT'S     Current Outpatient Medications  Medication Sig Dispense Refill  . aspirin 81 MG tablet Take 81 mg by mouth daily.    . Blood Glucose Monitoring Suppl (BLOOD GLUCOSE SYSTEM PAK) KIT Please dispense based on patient and insurance preference. Use to monitor FSBS 2-3x weekly. Dx: E11.9. 1 each 1  . carvedilol (COREG) 3.125 MG tablet TAKE 1 TABLET BY MOUTH TWICE DAILY WITH A MEAL 180 tablet 0  . fluticasone (FLONASE) 50 MCG/ACT nasal spray Place 2 sprays into both nostrils daily. 16 g 6  . Glucose Blood (BLOOD GLUCOSE TEST STRIPS) STRP Please dispense based on patient and insurance preference. Use to monitor FSBS 2-3x weekly. Dx: E11.9. 50 each 1  . Lancet Devices MISC Please dispense based on patient and insurance preference. Use to monitor FSBS 2-3x weekly. Dx: E11.9. 1 each 1  . levocetirizine (XYZAL) 5 MG tablet TAKE 1 TABLET BY MOUTH ONCE DAILY IN THE EVENING 30 tablet 0  . metolazone (ZAROXOLYN) 2.5 MG tablet TAKE 1 TABLET BY MOUTH AS DIRECTED BY  HEART  FAILURE  CLINIC 15 tablet 0  . Multiple Vitamin (MULTIVITAMIN WITH MINERALS) TABS tablet Take 1 tablet by mouth daily.    Marland Kitchen oxyCODONE (OXY IR/ROXICODONE) 5 MG immediate release tablet Take 1 tablet (5 mg total) by mouth 3 (three) times daily as needed for severe pain. 60 tablet 0  . potassium chloride SA (KLOR-CON) 20 MEQ tablet Take 1 tablet (20 mEq total) by mouth daily. Take an additional 70mq with Metolazone. 90 tablet 3  .  sacubitril-valsartan (ENTRESTO) 49-51 MG Take 1 tablet by mouth 2 (two) times daily. Needs appt for future refills 180 tablet 0  . spironolactone (ALDACTONE) 25 MG tablet Take 1 tablet (25 mg total) by mouth daily. 90 tablet 3  . torsemide (DEMADEX) 20 MG tablet Take 2 tablets (40 mg total) by mouth daily. 60 tablet 11   No current facility-administered medications for this visit.     Past Medical History:  Diagnosis Date  . AICD (automatic cardioverter/defibrillator) present   . Arthritis   . Cardiomyopathy, nonischemic (HCC)    takes Digoxin daily and Carvedilol daily  . CHF (congestive heart failure) (HCC)    takes Lasix daily as well Aldactone  . Chronic back pain   . Chronic systolic heart failure ([place]   . Cirrhosis ([place]   . Depression    takes Prozac daily  . GERD (gastroesophageal reflux disease)    takes Omeprazole daily  . Hx of Alcohol consumption heavy    rare beer currently  . hx of Tobacco abuse    quit smoking 2014  . Hyperlipidemia    was on Simvastatin but has been off a year  . Insomnia    takes Ambien as needed(just got script yesterday)  . Orthostatic hypotension   . Scoliosis   . Wilm's tumor    Nephrectomy  age 27 (XRT and chemo)    ROS:   All systems reviewed and negative except as noted in the HPI.   Past Surgical History:  Procedure Laterality Date  . CARPAL TUNNEL RELEASE Right 01/09/2013   Procedure: CARPAL TUNNEL RELEASE;  Surgeon: Winfield Cunas, MD;  Location: [place] [facility];  Service: Neurosurgery;  Laterality: Right;  RIGHT carpal tunnel release  . CARPAL TUNNEL RELEASE Left 01/30/2013   Procedure: LEFT CARPAL TUNNEL RELEASE;  Surgeon: Winfield Cunas, MD;  Location: [place] [facility];  Service: Neurosurgery;  Laterality: Left;  LEFT Carpal Tunnel release  . CHEST TUBE INSERTION Right 09/09/2013   Procedure: INSERTION PLEURAL DRAINAGE CATHETER;  Surgeon: Ivin Poot, MD;  Location: [facility];  Service: Thoracic;  Laterality: Right;  . HYDROCELE  EXCISION Right 04/11/2017   Procedure: RIGHT HYDROCELECTOMY ADULT;  Surgeon: Irine Seal, MD;  Location: [facility];  Service: Urology;  Laterality: Right;  . hydrocelectomy  2008  . ICD  05/25/2011   [employer] Endotak [employer] SG lead/Energen single chamber device  . IMPLANTABLE CARDIOVERTER DEFIBRILLATOR IMPLANT N/A 05/25/2011   Procedure: IMPLANTABLE CARDIOVERTER DEFIBRILLATOR IMPLANT;  Surgeon: Evans Lance, MD;  Location: [facility] CATH LAB;  Service: Cardiovascular;  Laterality: N/A;  . NEPHRECTOMY  36 yrs ago   Wilms Tumor  . RIGHT HEART CATH N/A 01/29/2019   Procedure: RIGHT HEART CATH;  Surgeon: Jolaine Artist, MD;  Location: [place] CV LAB;  Service: Cardiovascular;  Laterality: N/A;  . RIGHT HEART CATH N/A 09/08/2019   Procedure: RIGHT HEART CATH;  Surgeon: Jolaine Artist, MD;  Location: [place] CV LAB;  Service: Cardiovascular;  Laterality: N/A;  . RIGHT/LEFT HEART CATH AND CORONARY ANGIOGRAPHY N/A 02/12/2017   Procedure: RIGHT/LEFT HEART CATH AND CORONARY ANGIOGRAPHY;  Surgeon: Jolaine Artist, MD;  Location: [place] CV LAB;  Service: Cardiovascular;  Laterality: N/A;  . ULNAR NERVE TRANSPOSITION Right 01/09/2013   Procedure: ULNAR NERVE DECOMPRESSION/TRANSPOSITION;  Surgeon: Winfield Cunas, MD;  Location: [place] [facility]UR[facility] OR[facility];  Service: Neurosurgery;  Laterality: Right;  RIGHT ulnar nerve decompression     Family History  Problem Relation Age of Onset  . Diabetes Mother      Social History   Socioeconomic History  . Marital status: Single    Spouse name: Not on file  . Number of children: 1  . Years of education: Not on file  . Highest education level: Not on file  Occupational History  . Occupation: Full time    Comment: Engineer, civil (consulting)  Tobacco Use  . Smoking status: Former Smoker    Packs/day: 0.25    Years: 20.00    Pack years: 5.00    Types: Cigarettes    Quit date: 09/07/2012    Years since quitting: 8.1  . Smokeless tobacco: Current User     Types: Snuff  Vaping Use  . Vaping Use: Never used  Substance and Sexual Activity  . Alcohol use: Yes    Alcohol/week: 0.0 standard drinks    Comment: histoorically a heavy drinker ("beer only"), none for a couple months   . Drug use: No  . Sexual activity: Not Currently  Other Topics Concern  . Not on file  Social History Narrative  . Not on file   Social Determinants of Health   Financial Resource Strain: Not on file  Food Insecurity: Not on file  Transportation Needs: Not on file  Physical Activity: Not on file  Stress: Not on file  Social Connections:  Not on file  Intimate Partner Violence: Not on file     BP 110/68   Pulse 98   Ht _0  (1.753 m)   Wt 155 lb 3.2 oz (70.4 kg)   SpO2 96%   BMI 22.92 kg/m   Physical Exam:  Well appearing NAD HEENT: Unremarkable Neck:  No JVD, no thyromegally Lymphatics:  No adenopathy Back:  No CVA tenderness Lungs:  Clear with no wheezes HEART:  Regular rate rhythm, 2/6 systolic murmurs, no rubs, no clicks S3 is present Abd:  soft, positive bowel sounds, no organomegally, no rebound, no guarding Ext:  2 plus pulses, no edema, no cyanosis, no clubbing Skin:  No rashes no nodules Neuro:  CN II through XII intact, motor grossly intact   DEVICE  Normal device function.  See PaceArt for details.   Assess/Plan 1. Chronic systolic heart failure - he has class 2 symptoms and is tolerating maximal medical therapy. He will continue his current meds and maintain a low sodium diet. 2. TR - I cannot hear his TR on exam and he does not have a RV lift or heave. He does have MR and an S3.  3. Cirrhosis - he is well compensated and has no peripheral edema and minimal abdominal swelling.  4. VT - he has been told about not driving for another month.   Carleene Overlie Angelis Gates,MD :

## 2020-11-10 ENCOUNTER — Ambulatory Visit (INDEPENDENT_AMBULATORY_CARE_PROVIDER_SITE_OTHER): Payer: Medicare HMO

## 2020-11-10 DIAGNOSIS — I420 Dilated cardiomyopathy: Secondary | ICD-10-CM | POA: Diagnosis not present

## 2020-11-10 LAB — CUP PACEART REMOTE DEVICE CHECK
Battery Remaining Longevity: 54 mo
Battery Remaining Percentage: 56 %
Brady Statistic RV Percent Paced: 0 %
Date Time Interrogation Session: 20220601134300
HighPow Impedance: 91 Ohm
Implantable Lead Implant Date: 20121214
Implantable Lead Location: 753860
Implantable Lead Model: 180
Implantable Lead Serial Number: 307189
Implantable Pulse Generator Implant Date: 20121214
Lead Channel Impedance Value: 544 Ohm
Lead Channel Pacing Threshold Amplitude: 0.7 V
Lead Channel Pacing Threshold Pulse Width: 0.4 ms
Lead Channel Setting Pacing Amplitude: 2.4 V
Lead Channel Setting Pacing Pulse Width: 0.4 ms
Lead Channel Setting Sensing Sensitivity: 0.4 mV
Pulse Gen Serial Number: 118994

## 2020-11-11 ENCOUNTER — Encounter: Payer: Self-pay | Admitting: *Deleted

## 2020-11-15 ENCOUNTER — Other Ambulatory Visit: Payer: Self-pay | Admitting: *Deleted

## 2020-11-15 MED ORDER — EMPAGLIFLOZIN 25 MG PO TABS: 25.0000 mg | ORAL_TABLET | Freq: Every day | ORAL | 3 refills | Status: DC

## 2020-11-28 ENCOUNTER — Other Ambulatory Visit: Payer: Self-pay | Admitting: *Deleted

## 2020-11-28 NOTE — Telephone Encounter (Signed)
Received call from patient.   Requested refill on Oxycodone.   Ok to refill??  Last office visit 11/08/2020.  Last refill 10/28/2020.

## 2020-11-29 MED ORDER — OXYCODONE HCL 5 MG PO TABS: 5.0000 mg | ORAL_TABLET | Freq: Three times a day (TID) | ORAL | 0 refills | Status: DC | PRN

## 2020-12-04 IMAGING — CT CT ABDOMEN WO/W CM
3 of 10 series · 11 of 46 positions shown, 17 images · IV contrast (omnipaque)
Comparison: None.

CLINICAL DATA: Cirrhosis.

EXAM:
CT ABDOMEN WITHOUT AND WITH CONTRAST
TECHNIQUE: Multidetector CT imaging of the abdomen was performed following the
standard protocol before and following the bolus administration of
intravenous contrast.
CONTRAST:  100mL OMNIPAQUE IOHEXOL 300 MG/ML  SOLN

[Series 3: axial arterial · axial · arterial · 0.71mm/px · z∈[+941,+1019]mm · 3 of 77 slices shown]
[im 13/77  soft-tissue]
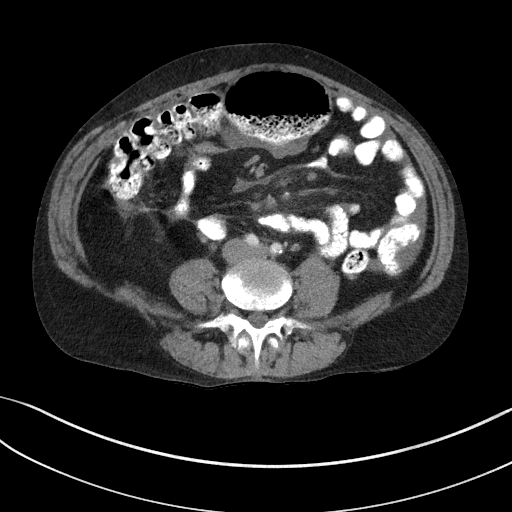
[im 26/77  soft-tissue]
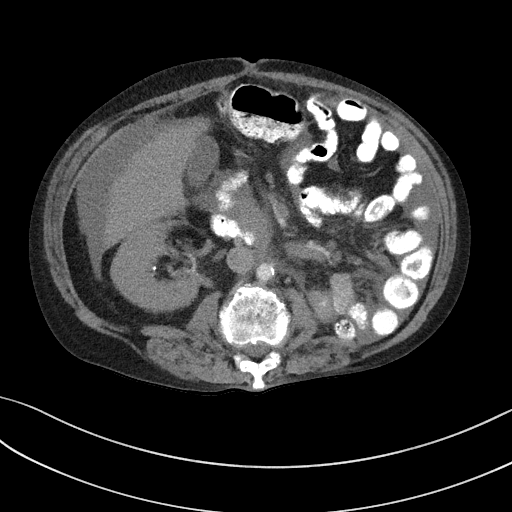
[im 39/77  soft-tissue]
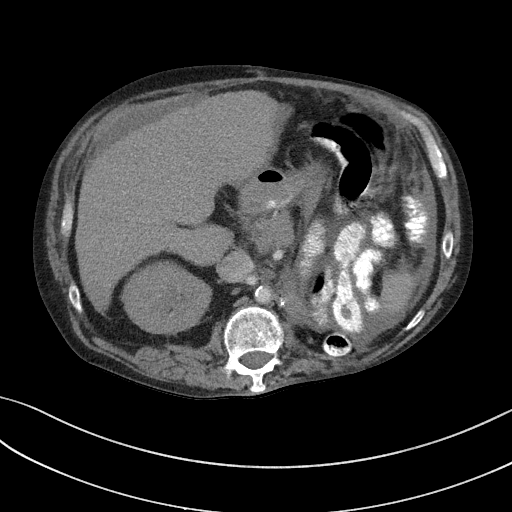

[Series 4: axial venous · axial · portal-venous · 0.71mm/px · z∈[+941,+1094]mm · 5 of 77 slices shown, 10 images]
[im 13/77  soft-tissue]
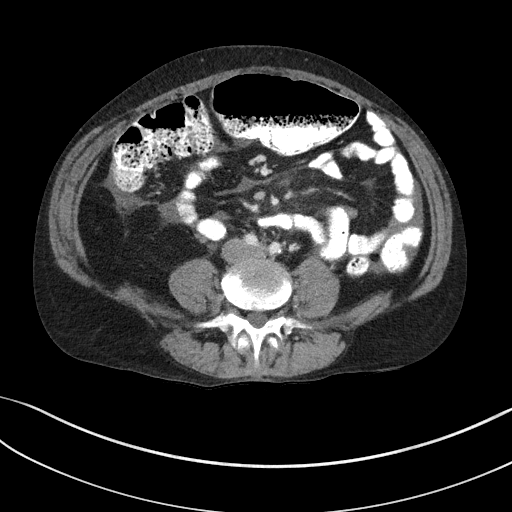
[im 13/77  bone]
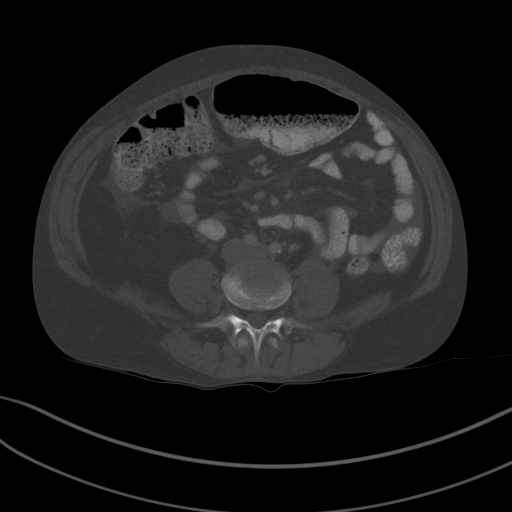
[im 26/77  soft-tissue]
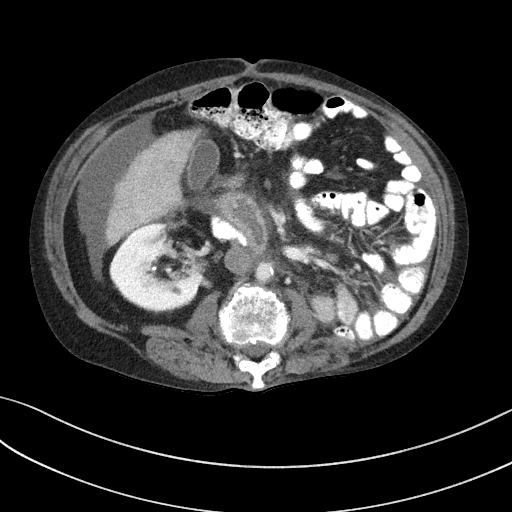
[im 26/77  lung]
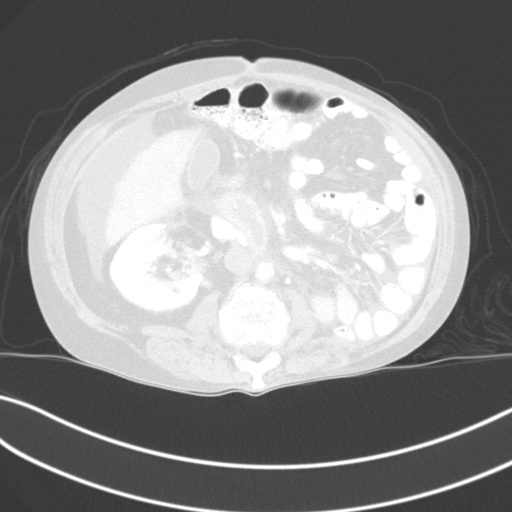
[im 39/77  soft-tissue]
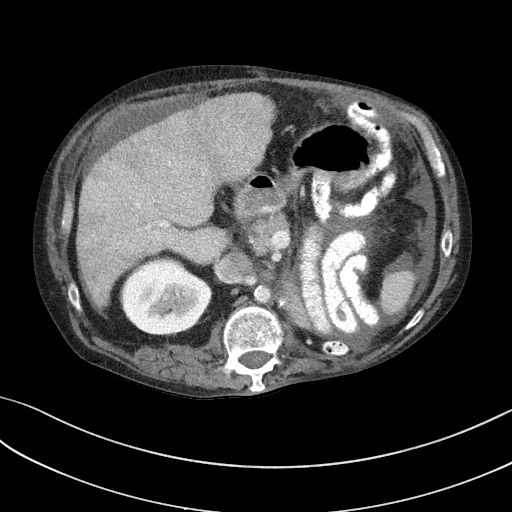
[im 39/77  lung]
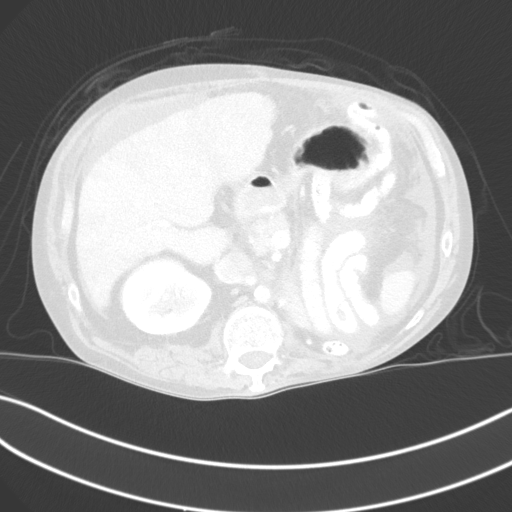
[im 51/77  soft-tissue]
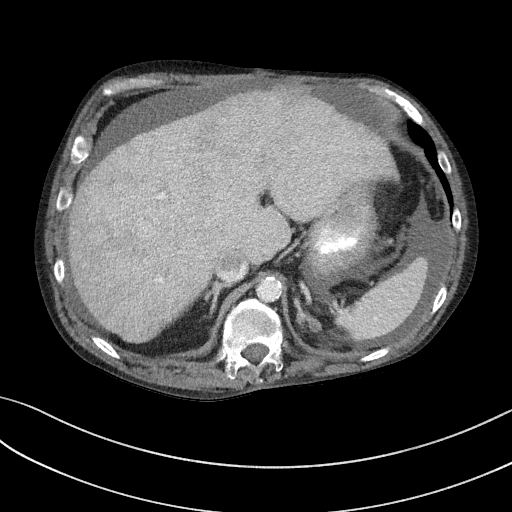
[im 51/77  lung]
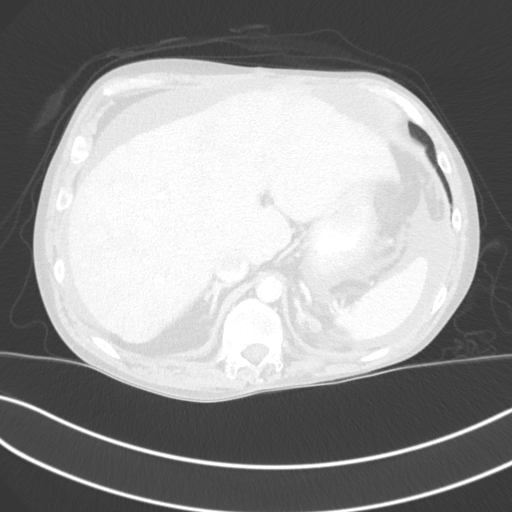
[im 64/77  soft-tissue]
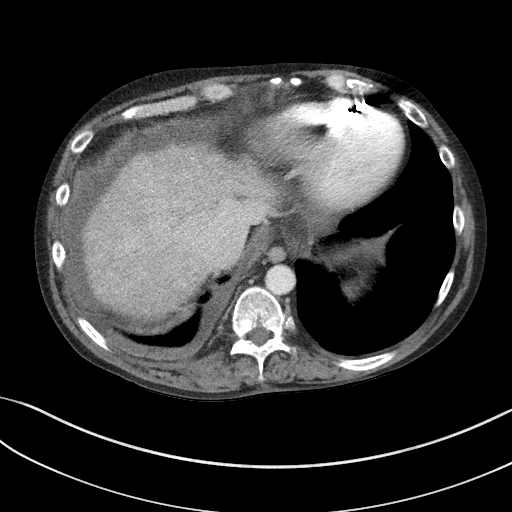
[im 64/77  lung]
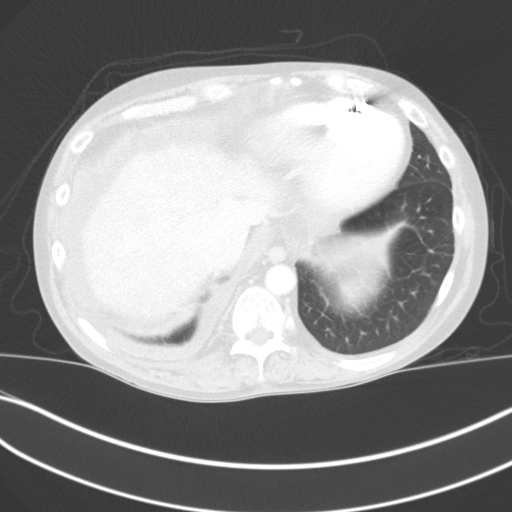

[Series 11: coronal arterial · coronal · arterial · 0.45mm/px · 3 of 101 slices shown, 4 images]
[im 26/101  soft-tissue]
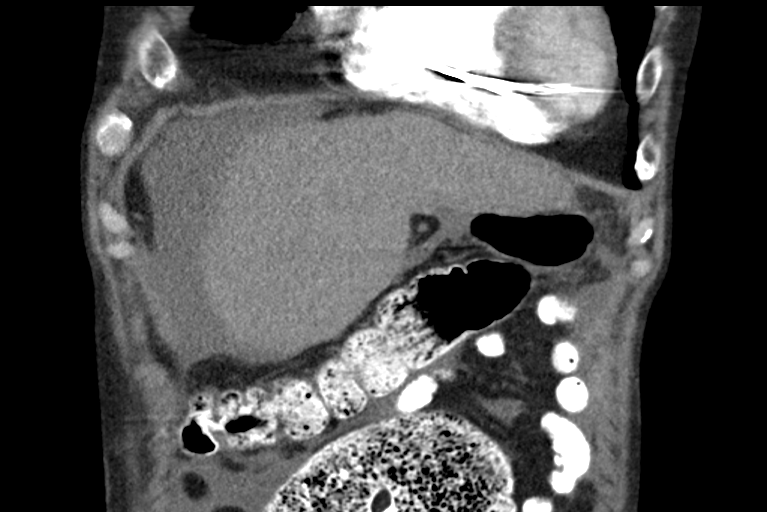
[im 51/101  soft-tissue]
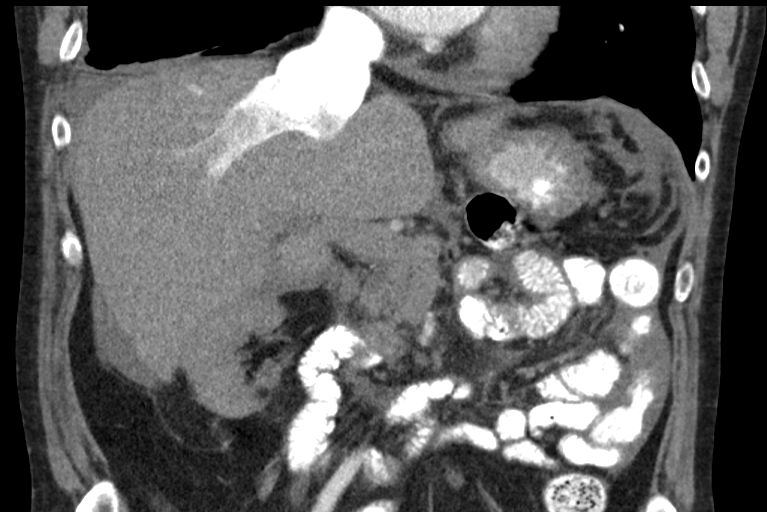
[im 51/101  bone]
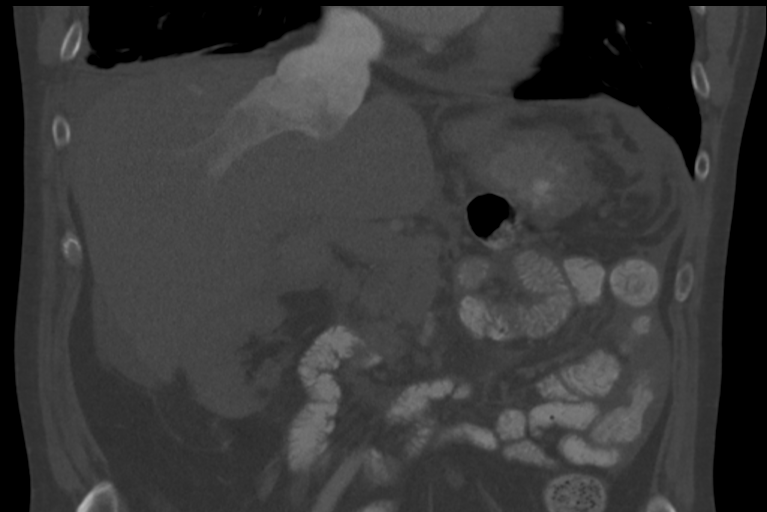
[im 76/101  soft-tissue]
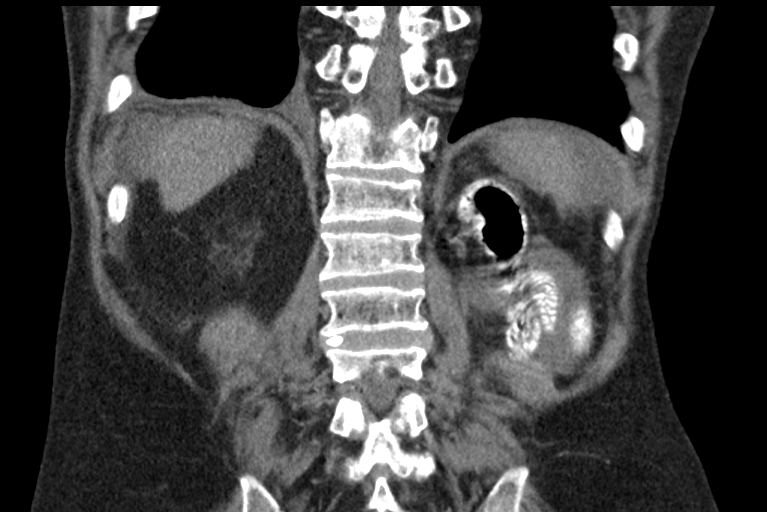

[11 of 46 positions shown; findings below may reference images not displayed]

FINDINGS: Lower chest: Trace right pleural effusion.  Cardiomegaly.

Hepatobiliary: Macronodular hepatic contour with enlargement of the
caudate, compatible with the clinical history of cirrhosis.
Heterogeneous portal venous perfusion. No suspicious/enhancing
hepatic lesions.

Gallbladder is unremarkable. No intrahepatic or extrahepatic ductal
dilatation.

Pancreas: Within normal limits.

Spleen: Spleen is normal in size.

Adrenals/Urinary Tract: Adrenal glands are within normal limits.

Status post left nephrectomy. Right kidney is within normal limits.
No enhancing renal lesions.

3 mm nonobstructing right lower pole renal calculus (series 3/image
83). No hydronephrosis.

Stomach/Bowel: Stomach is within normal limits.

Visualized bowel is unremarkable.

Vascular/Lymphatic: No evidence of abdominal aortic aneurysm.

Atherosclerotic calcifications of the abdominal aorta and branch
vessels. Portal vein is patent.

No suspicious abdominopelvic lymphadenopathy.

Other: Mild to moderate upper abdominal ascites.

Musculoskeletal: Visualized osseous structures are within normal
limits.
IMPRESSION: Cirrhosis.  No suspicious/enhancing hepatic lesions.

Mild to moderate upper abdominal ascites. Trace right pleural
effusion.

Status post left nephrectomy. 3 mm nonobstructing right lower pole
renal calculus. No hydronephrosis.

## 2020-12-05 NOTE — Progress Notes (Signed)
Remote ICD transmission.   

## 2020-12-27 ENCOUNTER — Other Ambulatory Visit: Payer: Self-pay

## 2020-12-27 ENCOUNTER — Ambulatory Visit (HOSPITAL_COMMUNITY): Payer: Medicare HMO

## 2020-12-27 ENCOUNTER — Ambulatory Visit (HOSPITAL_COMMUNITY)
Admission: RE | Admit: 2020-12-27 | Discharge: 2020-12-27 | Disposition: A | Discharge status: Home / Self Care | Payer: Medicare HMO | Source: Ambulatory Visit | Attending: Family Medicine | Admitting: Family Medicine

## 2020-12-27 DIAGNOSIS — I5022 Chronic systolic (congestive) heart failure: Secondary | ICD-10-CM

## 2020-12-27 DIAGNOSIS — I4891 Unspecified atrial fibrillation: Secondary | ICD-10-CM | POA: Insufficient documentation

## 2020-12-27 DIAGNOSIS — Z95810 Presence of automatic (implantable) cardiac defibrillator: Secondary | ICD-10-CM | POA: Insufficient documentation

## 2020-12-27 DIAGNOSIS — I088 Other rheumatic multiple valve diseases: Secondary | ICD-10-CM | POA: Insufficient documentation

## 2020-12-27 LAB — ECHOCARDIOGRAM COMPLETE
AR max vel: 2.13 cm2
AV Area VTI: 2.35 cm2
AV Area mean vel: 2.03 cm2
AV Mean grad: 2 mmHg
AV Peak grad: 2.8 mmHg
Ao pk vel: 0.84 m/s
S' Lateral: 5.3 cm

## 2020-12-27 MED ORDER — OXYCODONE HCL 5 MG PO TABS: 5.0000 mg | ORAL_TABLET | Freq: Three times a day (TID) | ORAL | 0 refills | Status: DC | PRN

## 2020-12-27 MED ORDER — PERFLUTREN LIPID MICROSPHERE
1.0000 mL | INTRAVENOUS | Status: AC | PRN
  Administered 2020-12-27: 2 mL via INTRAVENOUS
  Filled 2020-12-27: qty 10

## 2020-12-27 NOTE — Progress Notes (Signed)
  Echocardiogram 2D Echocardiogram has been performed.  Merrie Roof F 12/27/2020, 12:07 PM

## 2021-01-23 ENCOUNTER — Other Ambulatory Visit (HOSPITAL_COMMUNITY): Payer: Self-pay | Admitting: Internal Medicine

## 2021-01-27 ENCOUNTER — Other Ambulatory Visit: Payer: Self-pay | Admitting: *Deleted

## 2021-01-27 MED ORDER — OXYCODONE HCL 5 MG PO TABS: 5.0000 mg | ORAL_TABLET | Freq: Three times a day (TID) | ORAL | 0 refills | Status: DC | PRN

## 2021-01-27 NOTE — Telephone Encounter (Signed)
Received call from patient.   Requested refill on Oxycodone.   Ok to refill??  Last office visit 11/08/2020.  Last refill 12/27/2020.

## 2021-02-09 ENCOUNTER — Ambulatory Visit (INDEPENDENT_AMBULATORY_CARE_PROVIDER_SITE_OTHER): Payer: Medicare HMO

## 2021-02-09 DIAGNOSIS — I420 Dilated cardiomyopathy: Secondary | ICD-10-CM | POA: Diagnosis not present

## 2021-02-14 LAB — CUP PACEART REMOTE DEVICE CHECK
Battery Remaining Longevity: 48 mo
Battery Remaining Percentage: 53 %
Brady Statistic RV Percent Paced: 0 %
Date Time Interrogation Session: 20220901045200
HighPow Impedance: 80 Ohm
Implantable Lead Implant Date: 20121214
Implantable Lead Location: 753860
Implantable Lead Model: 180
Implantable Lead Serial Number: 307189
Implantable Pulse Generator Implant Date: 20121214
Lead Channel Impedance Value: 631 Ohm
Lead Channel Pacing Threshold Amplitude: 0.7 V
Lead Channel Pacing Threshold Pulse Width: 0.4 ms
Lead Channel Setting Pacing Amplitude: 2.4 V
Lead Channel Setting Pacing Pulse Width: 0.4 ms
Lead Channel Setting Sensing Sensitivity: 0.4 mV
Pulse Gen Serial Number: 118994

## 2021-02-21 NOTE — Progress Notes (Signed)
Remote ICD transmission.   

## 2021-02-28 ENCOUNTER — Other Ambulatory Visit: Payer: Self-pay | Admitting: *Deleted

## 2021-02-28 MED ORDER — OXYCODONE HCL 5 MG PO TABS: 5.0000 mg | ORAL_TABLET | Freq: Three times a day (TID) | ORAL | 0 refills | Status: DC | PRN

## 2021-02-28 NOTE — Telephone Encounter (Signed)
Received VM from patient requesting refill on Oxycodone.   Ok to refill??  Last office visit 11/08/2020.  Last refill 01/27/2021.

## 2021-03-29 ENCOUNTER — Other Ambulatory Visit: Payer: Self-pay | Admitting: *Deleted

## 2021-03-29 NOTE — Telephone Encounter (Signed)
Received call from patient.   Requested refill on Oxycodone/APAP.   Ok to refill??  Last office visit 11/08/2020.  Last refill 02/28/2021.

## 2021-03-30 ENCOUNTER — Ambulatory Visit (HOSPITAL_COMMUNITY)
Admission: RE | Admit: 2021-03-30 | Discharge: 2021-03-30 | Disposition: A | Discharge status: Home / Self Care | Payer: Medicare HMO | Source: Ambulatory Visit | Attending: Internal Medicine | Admitting: Internal Medicine

## 2021-03-30 ENCOUNTER — Other Ambulatory Visit: Payer: Self-pay

## 2021-03-30 ENCOUNTER — Encounter (HOSPITAL_COMMUNITY): Payer: Self-pay | Admitting: Internal Medicine

## 2021-03-30 ENCOUNTER — Telehealth (HOSPITAL_COMMUNITY): Payer: Self-pay | Admitting: *Deleted

## 2021-03-30 VITALS — BP 104/70 | HR 93 | Wt 157.2 lb

## 2021-03-30 DIAGNOSIS — M419 Scoliosis, unspecified: Secondary | ICD-10-CM | POA: Diagnosis not present

## 2021-03-30 DIAGNOSIS — F17210 Nicotine dependence, cigarettes, uncomplicated: Secondary | ICD-10-CM | POA: Insufficient documentation

## 2021-03-30 DIAGNOSIS — Z7984 Long term (current) use of oral hypoglycemic drugs: Secondary | ICD-10-CM | POA: Diagnosis not present

## 2021-03-30 DIAGNOSIS — I509 Heart failure, unspecified: Secondary | ICD-10-CM | POA: Diagnosis not present

## 2021-03-30 DIAGNOSIS — I5022 Chronic systolic (congestive) heart failure: Secondary | ICD-10-CM

## 2021-03-30 DIAGNOSIS — I071 Rheumatic tricuspid insufficiency: Secondary | ICD-10-CM | POA: Insufficient documentation

## 2021-03-30 DIAGNOSIS — Z79899 Other long term (current) drug therapy: Secondary | ICD-10-CM | POA: Diagnosis not present

## 2021-03-30 DIAGNOSIS — Z7982 Long term (current) use of aspirin: Secondary | ICD-10-CM | POA: Diagnosis not present

## 2021-03-30 DIAGNOSIS — Z85528 Personal history of other malignant neoplasm of kidney: Secondary | ICD-10-CM | POA: Insufficient documentation

## 2021-03-30 DIAGNOSIS — Z923 Personal history of irradiation: Secondary | ICD-10-CM | POA: Diagnosis not present

## 2021-03-30 DIAGNOSIS — R69 Illness, unspecified: Secondary | ICD-10-CM | POA: Diagnosis not present

## 2021-03-30 DIAGNOSIS — Z905 Acquired absence of kidney: Secondary | ICD-10-CM | POA: Diagnosis not present

## 2021-03-30 DIAGNOSIS — K7469 Other cirrhosis of liver: Secondary | ICD-10-CM

## 2021-03-30 DIAGNOSIS — Z95810 Presence of automatic (implantable) cardiac defibrillator: Secondary | ICD-10-CM | POA: Insufficient documentation

## 2021-03-30 DIAGNOSIS — I428 Other cardiomyopathies: Secondary | ICD-10-CM | POA: Insufficient documentation

## 2021-03-30 DIAGNOSIS — K7460 Unspecified cirrhosis of liver: Secondary | ICD-10-CM | POA: Insufficient documentation

## 2021-03-30 LAB — CBC
HCT: 41.9 % (ref 39.0–52.0)
Hemoglobin: 14.2 g/dL (ref 13.0–17.0)
MCH: 33.8 pg (ref 26.0–34.0)
MCHC: 33.9 g/dL (ref 30.0–36.0)
MCV: 99.8 fL (ref 80.0–100.0)
Platelets: 272 10*3/uL (ref 150–400)
RBC: 4.2 MIL/uL — ABNORMAL LOW (ref 4.22–5.81)
RDW: 14.6 % (ref 11.5–15.5)
WBC: 4.3 10*3/uL (ref 4.0–10.5)
nRBC: 0 % (ref 0.0–0.2)

## 2021-03-30 LAB — COMPREHENSIVE METABOLIC PANEL
ALT: 19 U/L (ref 0–44)
AST: 32 U/L (ref 15–41)
Albumin: 3.2 g/dL — ABNORMAL LOW (ref 3.5–5.0)
Alkaline Phosphatase: 190 U/L — ABNORMAL HIGH (ref 38–126)
Anion gap: 10 (ref 5–15)
BUN: 12 mg/dL (ref 6–20)
CO2: 30 mmol/L (ref 22–32)
Calcium: 8.8 mg/dL — ABNORMAL LOW (ref 8.9–10.3)
Chloride: 94 mmol/L — ABNORMAL LOW (ref 98–111)
Creatinine, Ser: 1.14 mg/dL (ref 0.61–1.24)
GFR, Estimated: >60 mL/min (ref >60)
Glucose, Bld: 154 mg/dL — ABNORMAL HIGH (ref 70–99)
Potassium: 3.2 mmol/L — ABNORMAL LOW (ref 3.5–5.1)
Sodium: 134 mmol/L — ABNORMAL LOW (ref 135–145)
Total Bilirubin: 1.2 mg/dL (ref 0.3–1.2)
Total Protein: 6.7 g/dL (ref 6.5–8.1)

## 2021-03-30 LAB — BRAIN NATRIURETIC PEPTIDE: B Natriuretic Peptide: 3343.2 pg/mL — ABNORMAL HIGH (ref 0.0–100.0)

## 2021-03-30 MED ORDER — OXYCODONE HCL 5 MG PO TABS: 5.0000 mg | ORAL_TABLET | Freq: Three times a day (TID) | ORAL | 0 refills | Status: DC | PRN

## 2021-03-30 NOTE — Telephone Encounter (Signed)
-----   Message from Jolaine Artist, MD sent at 03/30/2021  3:47 PM EDT ----- Increase potassium to 40 daily. Recheck bmet next week.

## 2021-03-30 NOTE — Patient Instructions (Signed)
Good to see you today!  No medication changes  Lab work done today ,we will call with any abnormal results  Your physician recommends that you schedule a follow-up appointment in: 4 months  If you have any questions or concerns before your next appointment please send [place] a message through [place] or call our office at [phone].    TO LEAVE A MESSAGE FOR THE NURSE SELECT OPTION 2, PLEASE LEAVE A MESSAGE INCLUDING: YOUR NAME DATE OF BIRTH CALL BACK NUMBER REASON FOR CALL**this is important as we prioritize the call backs  YOU WILL RECEIVE A CALL BACK THE SAME DAY AS LONG AS YOU CALL BEFORE 4:00 PM  At the [facility], you and your health needs are our priority. As part of our continuing mission to provide you with exceptional heart care, we have created designated Provider Care Teams. These Care Teams include your primary Cardiologist (physician) and Advanced Practice Providers (APPs- Physician Assistants and Nurse Practitioners) who all work together to provide you with the care you need, when you need it.   You may see any of the following providers on your designated Care Team at your next follow up: Dr Glori Bickers Dr Loralie Champagne Dr Patrice Paradise, NP Lyda Jester, [place] Ginnie Smart Audry Riles, PharmD   Please be sure to bring in all your medications bottles to every appointment.

## 2021-03-30 NOTE — Progress Notes (Signed)
Patient ID: [name], male   DOB: 09/29/1970, 50 y.o.   MRN: [id number]   ADVANCED HF CLINIC NOTE    Primary Cardiologist: Dr Domenic Polite HF MD: Dr Haroldine Laws [name]: Dr Mosetta Pigeon  HPI: [name] is 50 year old with history of NICM (diagnosed 10/2010 - norm cors 2012 and 9/47), chronic systolic heart failure EF 15% (08/2013), S/P ICD [employer], smoker, Wilms tumor s/p nephrectomy at age 86 had chemoradiation, scoliosis.    We saw him at the end of March 2015 and had large recurrent R pleural effusion. Referred to Dr. Prescott Gum who placed Pleurex catheter on 4/10, removed on 09/28/13.   CPX (5/15) with RER 1.06, VO2 max 18.5, VE/VCO2 slope 32.1.  Moderate to severe functional limitation, circulatory.   CPX 09/17/16 Peak VO2: 20.5 (53% predicted peak VO2) Slope 35 Underwent L/RHC on 02/12/17. Had normal coronaries and relatively well-compensated hemodynamics. EF 20%.  Echo 6/20 EF 15% RV mild HK with severe TR Personally reviewed  CPX 8/20 with pVO2 17.1 (down from 20.5)  Slope 35 (stable).   CT abdomen 10/20 c/w cirrhosis.  Paracentesis 03/02/19: 3.7 L off. No need for repeat paracenteses  Has been evaluated at [facility] by Dr Mosetta Pigeon for possible transplant. Seen last in 4/21 - felt too early for transplant but getting closer.  Here for routine f/u. Says he feels good. Say he does what he wants to do. Going fishing. Can walk a 1/4 mile.Takes rest breaks as needed.  Denies CP, SOB, orthopnea or PND.   Echo 12/17/20: LVEF < 20% RV normal severe TR Personally reviewed   CPX 12/17/20 FVC 2.41 (49%)       FEV1 1.51 (38%)         FEV1/FVC 63 (78%)         MVV 62 (40%)       BP rest: 106/60 Standing BP: 100/62 BP peak: 104/62  Peak VO2: 18.3 (50% predicted peak VO2)  VE/VCO2 slope:  38  Peak RER: 1.10  Ventilatory Threshold: 15.6 (42% predicted or measured peak VO2)  VE/MVV:  84%     Echo 01/29/19   RA = 17 RV = 46/18 PA = 51/22 (33) PCW = 28 Fick cardiac output/index  =4.0/2.2 PVR =1.0 WU Ao sat = 99% PA sat = 62%, 62% RA/PCWP = 0.61 PaPI = 1.71  CPX 01/15/19   FVC 2.85 (57%)      FEV1 2.04 (51%)        FEV1/FVC 72 (89%)        MVV 79 (50%)      Resting HR: 96 Peak HR: 157   (90% age predicted max HR)  BP rest: 92/64 BP peak: 100/58  Peak VO2: 17.7 (47% predicted peak VO2)  VE/VCO2 slope:  35  OUES: 1.30  Peak RER: 1.18  Ventilatory Threshold: 14.0 (37% predicted or measured peak VO2 and 79% measured PVO2)  VE/MVV:  58%  O2pulse:  8   (57% predicted O2pulse)    ECHO 04/2014 EF 10% RV mildly dilated.   ECHO 2/18 EF 15% Mild Richard RV mild HK. Severe TR. Triv Richard . RV ok Echo 11/2016  EF 15% RV ok. Moderate to severe TR  Cath 02/12/17:  Normal cors  Ao = 91/60 (74) LV = 102/18 RA =  2 RV = 48/5 PA = 48/21 (31) PCW = 20 Fick cardiac output/index = 4.0/2.3 PVR = 2.8 WU Ao sat = 95% PA sat = 68%, 70% RA/PCWP = 0.10 PaPI =  13.5   CPX 09/17/16 Resting HR: 86 Peak HR: 150   (85% age predicted max HR) BP rest: 96/80 BP peak: 118/72 Peak VO2: 20.5 (53% predicted peak VO2) Ve/VCO2 slope:  35 OUES: 1.38 Peak RER: 1.04 Ventilatory Threshold: 15.7 (41% predicted or measured peak VO2) Peak RR 32 Peak Ventilation:  47.5 VE/MVV:  50% PETCO2 at peak:  30 O2pulse:  8   (62% predicted O2pulse)  CPX 5/15 Resting HR: 89 Peak HR: 138   (77% age predicted max HR) BP rest: 106/68 BP peak: 126/74 (IPE) Peak VO2: 18.5 (46.5% predicted peak VO2) VE/VCO2 slope:  32.1 OUES: 1.33 Peak RER: 1.06 Ventilatory Threshold: 13.5 (33.9% predicted peak VO2) Peak RR 31 Peak Ventilation:  37.1 VE/MVV:  62.7% PETCO2 at peak:  33 O2pulse:  7   (58% predicted O2pulse)  SH: Lives with his son 30 year old . Unemployed. Disability approved 3 years ago. Does not drink alcohol. Dips tobacco.   FH: Grandfather MI  ROS: All systems negative except as listed in HPI, PMH and Problem List.  Past Medical History:  Diagnosis Date   AICD (automatic  cardioverter/defibrillator) present    Arthritis    Cardiomyopathy, nonischemic ([place])    takes Digoxin daily and Carvedilol daily   CHF (congestive heart failure) (HCC)    takes Lasix daily as well Aldactone   Chronic back pain    Chronic systolic heart failure (HCC)    Cirrhosis ([place])    Depression    takes Prozac daily   GERD (gastroesophageal reflux disease)    takes Omeprazole daily   Hx of Alcohol consumption heavy    rare beer currently   hx of Tobacco abuse    quit smoking 2014   Hyperlipidemia    was on Simvastatin but has been off a year   Insomnia    takes Ambien as needed(just got script yesterday)   Orthostatic hypotension    Scoliosis    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 57 (XRT and chemo)    Current Outpatient Medications  Medication Sig Dispense Refill   aspirin 81 MG tablet Take 81 mg by mouth daily.     Blood Glucose Monitoring Suppl (BLOOD GLUCOSE SYSTEM PAK) KIT Please dispense based on patient and insurance preference. Use to monitor FSBS 2-3x weekly. Dx: E11.9. 1 each 1   carvedilol (COREG) 3.125 MG tablet TAKE 1 TABLET BY MOUTH TWICE DAILY WITH A MEAL 180 tablet 0   empagliflozin (JARDIANCE) 25 MG TABS tablet Take 1 tablet (25 mg total) by mouth daily before breakfast. 90 tablet 3   ENTRESTO 49-51 MG TAKE 1 TABLET BY MOUTH TWICE DAILY . APPOINTMENT REQUIRED FOR FUTURE REFILLS 180 tablet 0   fluticasone (FLONASE) 50 MCG/ACT nasal spray Place 2 sprays into both nostrils daily. 16 g 6   Glucose Blood (BLOOD GLUCOSE TEST STRIPS) STRP Please dispense based on patient and insurance preference. Use to monitor FSBS 2-3x weekly. Dx: E11.9. 50 each 1   Lancet Devices MISC Please dispense based on patient and insurance preference. Use to monitor FSBS 2-3x weekly. Dx: E11.9. 1 each 1   levocetirizine (XYZAL) 5 MG tablet TAKE 1 TABLET BY MOUTH ONCE DAILY IN THE EVENING 30 tablet 0   metolazone (ZAROXOLYN) 2.5 MG tablet TAKE 1 TABLET BY MOUTH AS DIRECTED BY  HEART  FAILURE   CLINIC 15 tablet 0   Multiple Vitamin (MULTIVITAMIN WITH MINERALS) TABS tablet Take 1 tablet by mouth daily.     oxyCODONE (OXY  IR/ROXICODONE) 5 MG immediate release tablet Take 1 tablet (5 mg total) by mouth 3 (three) times daily as needed for severe pain. 60 tablet 0   potassium chloride SA (KLOR-CON) 20 MEQ tablet Take 1 tablet (20 mEq total) by mouth daily. Take an additional 18mq with Metolazone. 90 tablet 3   spironolactone (ALDACTONE) 25 MG tablet Take 1 tablet (25 mg total) by mouth daily. 90 tablet 3   torsemide (DEMADEX) 20 MG tablet Take 2 tablets (40 mg total) by mouth daily. 60 tablet 11   No current facility-administered medications for this encounter.     PHYSICAL EXAM: Vitals:   03/30/21 1002  BP: 104/70  Pulse: 93  SpO2: 97%  Weight: 71.3 kg (157 lb 3.2 oz)   Filed Weights   03/30/21 1002  Weight: 71.3 kg (157 lb 3.2 oz)   General:  Well appearing. No resp difficulty HEENT: normal Neck: supple. JVP to jaw with prominent v-waves. Carotids 2+ bilat; no bruits. No lymphadenopathy or thryomegaly appreciated. Cor: PMI nondisplaced. Regular rate & rhythm. No rubs, gallops or murmurs. Lungs: clear Abdomen: soft, nontender, mildly distended. No hepatosplenomegaly. No bruits or masses. Good bowel sounds. Extremities: no cyanosis, clubbing, rash, edema Neuro: alert & orientedx3, cranial nerves grossly intact. moves all 4 extremities w/o difficulty. Affect pleasant    ICD interrogation:  No VT/AF. Activity level 1.9 hr/day Personally reviewed  ASSESSMENT & PLAN: 1. Chronic Systolic HF:  Nonischemic cardiomyopathy, ECHO 03/2014 EF 10%. Echo 2/18 EF 15% RV mild HK  [employer] ICD.   - Echo 6/20 EF 15% RV normal severe TR - CPX test 8/20 with significant decline form previous.  - Cath 9/18: normal cors. Relatively well-compensated hemodynmaics - W/u has revealed evidence of cirrhosis which I suspect is due to a combination of ETOH and RV failure/severe TR.  Synthetic function currently seems ok (Albumin 4.2  INR 1.3 on 9/20) - I doubt TV repair will be sufficient for him so question will be if he will still be a transplant candidate. GI has seen and feels liver disease likely not severe enough to be a barrier to transplant.  May need [facility] team to weigh in as well - He saw Dr. DMosetta Pigeonin 4/21 who felt we would need to proceed with advanced therapies sooner than later in his disease given cirrhosis but still felt he was a bit early. Recommended close f/u with CPX - Symptomatically NHYA II - Volume status mildly elevated on torsemide 20 bid increase morning does for several days  - Continue entresto to 49/51. Unable to tolerate 97/103 - Failed titration on carvedilol to 3.125/6.25. Now back on 3.125 bid - Continue spiro 25 daily  - Continue Farxiga 10 - Unable to tolerate digoxin due to heartburn - blood type A-pos - Doing fairly well. CPX 7/22 was stable. Flat BP response. Was more lung limited on that test, Continue close monitoring. Be careful not to wait too long. Will follow closely need to be careful not to miss his window. Consider chest CT to get better understanding of why spirometry was down on last CPX. I will d/w Dr. DMosetta Pigeon   2. Severe TR - Echo with moderate to severe TR but TV seems structurally intact. ? If related to ICD wire or just dilated annulus.  - I doubt TV repair will be sufficient for him so question will be if he will still be a transplant candidate.  3. Cirrhosis - w/u as above - appreciate GI input - due  to RV failure. Stable    Glori Bickers, MD  10:46 AM

## 2021-03-30 NOTE — Addendum Note (Signed)
Encounter addended by: Stanford Scotland, RN on: 03/30/2021 11:05 AM  Actions taken: Charge Capture section accepted, Clinical Note Signed

## 2021-03-30 NOTE — Addendum Note (Signed)
Encounter addended by: Stanford Scotland, RN on: 03/30/2021 11:02 AM  Actions taken: Visit diagnoses modified, Order list changed, Diagnosis association updated

## 2021-04-07 ENCOUNTER — Ambulatory Visit (HOSPITAL_COMMUNITY)
Admission: RE | Admit: 2021-04-07 | Discharge: 2021-04-07 | Disposition: A | Discharge status: Home / Self Care | Payer: Medicare HMO | Source: Ambulatory Visit | Attending: Cardiology | Admitting: Cardiology

## 2021-04-07 ENCOUNTER — Other Ambulatory Visit: Payer: Self-pay

## 2021-04-07 DIAGNOSIS — I5022 Chronic systolic (congestive) heart failure: Secondary | ICD-10-CM | POA: Insufficient documentation

## 2021-04-07 LAB — BASIC METABOLIC PANEL
Anion gap: 10 (ref 5–15)
BUN: 15 mg/dL (ref 6–20)
CO2: 29 mmol/L (ref 22–32)
Calcium: 8.9 mg/dL (ref 8.9–10.3)
Chloride: 92 mmol/L — ABNORMAL LOW (ref 98–111)
Creatinine, Ser: 1.26 mg/dL — ABNORMAL HIGH (ref 0.61–1.24)
GFR, Estimated: >60 mL/min (ref >60)
Glucose, Bld: 132 mg/dL — ABNORMAL HIGH (ref 70–99)
Potassium: 3.7 mmol/L (ref 3.5–5.1)
Sodium: 131 mmol/L — ABNORMAL LOW (ref 135–145)

## 2021-04-28 ENCOUNTER — Other Ambulatory Visit: Payer: Self-pay | Admitting: *Deleted

## 2021-04-28 MED ORDER — OXYCODONE HCL 5 MG PO TABS: 5.0000 mg | ORAL_TABLET | Freq: Three times a day (TID) | ORAL | 0 refills | Status: DC | PRN

## 2021-04-28 NOTE — Telephone Encounter (Signed)
PDMP reviewed.  Appears patient takes this medication chronically.  Refill given in place of PCP who is out of office.  Prescription date for 30 days from last fill.

## 2021-04-28 NOTE — Telephone Encounter (Signed)
Ok to refill??  Last office visit 11/08/2020.  Last refill 03/30/2021.  Of note, letter sent to patient to schedule 6 month F/U.

## 2021-05-11 ENCOUNTER — Ambulatory Visit (INDEPENDENT_AMBULATORY_CARE_PROVIDER_SITE_OTHER): Payer: Medicare HMO

## 2021-05-11 DIAGNOSIS — I420 Dilated cardiomyopathy: Secondary | ICD-10-CM

## 2021-05-11 LAB — CUP PACEART REMOTE DEVICE CHECK
Battery Remaining Longevity: 48 mo
Battery Remaining Percentage: 48 %
Brady Statistic RV Percent Paced: 0 %
Date Time Interrogation Session: 20221201032000
HighPow Impedance: 84 Ohm
Implantable Lead Implant Date: 20121214
Implantable Lead Location: 753860
Implantable Lead Model: 180
Implantable Lead Serial Number: 307189
Implantable Pulse Generator Implant Date: 20121214
Lead Channel Impedance Value: 609 Ohm
Lead Channel Pacing Threshold Amplitude: 0.7 V
Lead Channel Pacing Threshold Pulse Width: 0.4 ms
Lead Channel Setting Pacing Amplitude: 2.4 V
Lead Channel Setting Pacing Pulse Width: 0.4 ms
Lead Channel Setting Sensing Sensitivity: 0.4 mV
Pulse Gen Serial Number: 118994

## 2021-05-23 NOTE — Progress Notes (Signed)
Remote ICD transmission.   

## 2021-05-26 ENCOUNTER — Other Ambulatory Visit (HOSPITAL_COMMUNITY): Payer: Self-pay | Admitting: Internal Medicine

## 2021-05-30 ENCOUNTER — Ambulatory Visit (INDEPENDENT_AMBULATORY_CARE_PROVIDER_SITE_OTHER): Payer: Medicare HMO | Admitting: Family Medicine

## 2021-05-30 ENCOUNTER — Encounter: Payer: Self-pay | Admitting: Family Medicine

## 2021-05-30 ENCOUNTER — Other Ambulatory Visit: Payer: Self-pay

## 2021-05-30 VITALS — BP 108/78 | HR 98 | Temp 97.8°F | Resp 18 | Ht 69.0 in | Wt 159.0 lb

## 2021-05-30 DIAGNOSIS — E1169 Type 2 diabetes mellitus with other specified complication: Secondary | ICD-10-CM

## 2021-05-30 DIAGNOSIS — Z23 Encounter for immunization: Secondary | ICD-10-CM | POA: Diagnosis not present

## 2021-05-30 DIAGNOSIS — G894 Chronic pain syndrome: Secondary | ICD-10-CM

## 2021-05-30 MED ORDER — FLUTICASONE PROPIONATE 50 MCG/ACT NA SUSP: 2.0000 | Freq: Every day | NASAL | 6 refills | Status: DC

## 2021-05-30 NOTE — Progress Notes (Signed)
Subjective:    Patient ID: [name], male    DOB: August 22, 1970, 50 y.o.   MRN: [id number]  HPI  Patient is a 50 year old Caucasian gentleman who is here today for follow-up of his diabetes.  He has a history of nonischemic cardiomyopathy with an ejection fraction of 15%.  He sees Dr. Haroldine Laws at the advanced [facility] failure clinic.  He is currently on Entresto carvedilol and spironolactone.  He was supposed to be taking [place].  However both Jardiance and Farxiga left the patient feeling "wiped out" in the morning.  He thought it was dropping his blood sugar stimulant.  The medication then he felt better after stopping.  However his blood sugars recently randomly have been 150.  He is not checking it every day but he admits his diet has been.  He also complains of nasal congestion.  He eats his house with complete scope to the humidity in his home is very low.  He reports pressure in sinus and mucus trapped deep within his nose every morning when he wakes up.  He is not taking his Flonase.  He is not using humidifier.  He denies any chest pain.  He denies any shortness of breath with exertion.  Overall he seems to be clinically doing well.  He also has a history of cirrhosis. Past Medical History:  Diagnosis Date   AICD (automatic cardioverter/defibrillator) present    Arthritis    Cardiomyopathy, nonischemic (HCC)    takes Digoxin daily and Carvedilol daily   CHF (congestive heart failure) (HCC)    takes Lasix daily as well Aldactone   Chronic back pain    Chronic systolic heart failure (HCC)    Cirrhosis (HCC)    Depression    takes Prozac daily   GERD (gastroesophageal reflux disease)    takes Omeprazole daily   Hx of Alcohol consumption heavy    rare beer currently   hx of Tobacco abuse    quit smoking 2014   Hyperlipidemia    was on Simvastatin but has been off a year   Insomnia    takes Ambien as needed(just got script yesterday)   Orthostatic hypotension    Scoliosis     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 27 (XRT and chemo)   Past Surgical History:  Procedure Laterality Date   CARPAL TUNNEL RELEASE Right 01/09/2013   Procedure: CARPAL TUNNEL RELEASE;  Surgeon: Winfield Cunas, MD;  Location: [facility];  Service: Neurosurgery;  Laterality: Right;  RIGHT carpal tunnel release   CARPAL TUNNEL RELEASE Left 01/30/2013   Procedure: LEFT CARPAL TUNNEL RELEASE;  Surgeon: Winfield Cunas, MD;  Location: [place] [facility];  Service: Neurosurgery;  Laterality: Left;  LEFT Carpal Tunnel release   CHEST TUBE INSERTION Right 09/09/2013   Procedure: INSERTION PLEURAL DRAINAGE CATHETER;  Surgeon: Ivin Poot, MD;  Location: [place];  Service: Thoracic;  Laterality: Right;   HYDROCELE EXCISION Right 04/11/2017   Procedure: RIGHT HYDROCELECTOMY ADULT;  Surgeon: Irine Seal, MD;  Location: [facility];  Service: Urology;  Laterality: Right;   hydrocelectomy  2008   ICD  05/25/2011   [facility] Scientific [employer] SG lead/[employer]ner[employer] single chamber device   IMPLANTABLE CARDIOVERTER DEFIBRILLATOR IMPLANT N/A 05/25/2011   Procedure: IMPLANTABLE CARDIOVERTER DEFIBRILLATOR IMPLANT;  Surgeon: Evans Lance, MD;  Location: [facility] CATH LAB;  Service: Cardiovascular;  Laterality: N/A;   NEPHRECTOMY  36 yrs ago   Wilms Tumor   RIGHT HEART CATH N/A 01/29/2019  Procedure: RIGHT HEART CATH;  Surgeon: Jolaine Artist, MD;  Location: [place] CV LAB;  Service: Cardiovascular;  Laterality: N/A;   RIGHT HEART CATH N/A 09/08/2019   Procedure: RIGHT HEART CATH;  Surgeon: Jolaine Artist, MD;  Location: [place] CV LAB;  Service: Cardiovascular;  Laterality: N/A;   RIGHT/LEFT HEART CATH AND CORONARY ANGIOGRAPHY N/A 02/12/2017   Procedure: RIGHT/LEFT HEART CATH AND CORONARY ANGIOGRAPHY;  Surgeon: Jolaine Artist, MD;  Location: [place] CV LAB;  Service: Cardiovascular;  Laterality: N/A;   ULNAR NERVE TRANSPOSITION Right 01/09/2013   Procedure: ULNAR NERVE DECOMPRESSION/TRANSPOSITION;  Surgeon: Winfield Cunas, MD;  Location: [place] [facility];  Service: Neurosurgery;  Laterality: Right;  RIGHT ulnar nerve decompression   Current Outpatient Medications on File Prior to Visit  Medication Sig Dispense Refill   aspirin 81 MG tablet Take 81 mg by mouth daily.     carvedilol (COREG) 3.125 MG tablet TAKE 1 TABLET BY MOUTH TWICE DAILY WITH A MEAL 180 tablet 0   ENTRESTO 49-51 MG TAKE 1 TABLET BY MOUTH TWICE DAILY . APPOINTMENT REQUIRED FOR FUTURE REFILLS 180 tablet 0   levocetirizine (XYZAL) 5 MG tablet TAKE 1 TABLET BY MOUTH ONCE DAILY IN THE EVENING 30 tablet 0   metolazone (ZAROXOLYN) 2.5 MG tablet TAKE 1 TABLET BY MOUTH AS DIRECTED BY  HEART  FAILURE  CLINIC 15 tablet 0   Multiple Vitamin (MULTIVITAMIN WITH MINERALS) TABS tablet Take 1 tablet by mouth daily.     oxyCODONE (OXY IR/ROXICODONE) 5 MG immediate release tablet Take 1 tablet (5 mg total) by mouth 3 (three) times daily as needed for severe pain. 60 tablet 0   potassium chloride SA (KLOR-CON) 20 MEQ tablet Take 1 tablet (20 mEq total) by mouth daily. Take an additional 81mq with Metolazone. 90 tablet 3   spironolactone (ALDACTONE) 25 MG tablet Take 1 tablet (25 mg total) by mouth daily. 90 tablet 3   torsemide (DEMADEX) 20 MG tablet Take 2 tablets (40 mg total) by mouth daily. 60 tablet 11   Blood Glucose Monitoring Suppl (BLOOD GLUCOSE SYSTEM PAK) KIT Please dispense based on patient and insurance preference. Use to monitor FSBS 2-3x weekly. Dx: E11.9. 1 each 1   empagliflozin (JARDIANCE) 25 MG TABS tablet Take 1 tablet (25 mg total) by mouth daily before breakfast. (Patient not taking: Reported on 05/30/2021) 90 tablet 3   fluticasone (FLONASE) 50 MCG/ACT nasal spray Place 2 sprays into both nostrils daily. (Patient not taking: Reported on 05/30/2021) 16 g 6   Glucose Blood (BLOOD GLUCOSE TEST STRIPS) STRP Please dispense based on patient and insurance preference. Use to monitor FSBS 2-3x weekly. Dx: E11.9. 50 each 1   Lancet Devices MISC  Please dispense based on patient and insurance preference. Use to monitor FSBS 2-3x weekly. Dx: E11.9. 1 each 1   No current facility-administered medications on file prior to visit.   Allergies  Allergen Reactions   Ibuprofen Hives   Nsaids     ELEVATED LFT'S   Social History   Socioeconomic History   Marital status: Single    Spouse name: Not on file   Number of children: 1   Years of education: Not on file   Highest education level: Not on file  Occupational History   Occupation: Full time    Comment: MEngineer, civil (consulting) Tobacco Use   Smoking status: Former    Packs/day: 0.25    Years: 20.00    Pack years: 5.00  Types: Cigarettes    Quit date: 09/07/2012    Years since quitting: 8.7   Smokeless tobacco: Current    Types: Snuff  Vaping Use   Vaping Use: Never used  Substance and Sexual Activity   Alcohol use: Yes    Alcohol/week: 0.0 standard drinks    Comment: histoorically a heavy drinker ("beer only"), none for a couple months    Drug use: No   Sexual activity: Not Currently  Other Topics Concern   Not on file  Social History Narrative   Not on file   Social Determinants of Health   Financial Resource Strain: Not on file  Food Insecurity: Not on file  Transportation Needs: Not on file  Physical Activity: Not on file  Stress: Not on file  Social Connections: Not on file  Intimate Partner Violence: Not on file     Review of Systems  All other systems reviewed and are negative.     Objective:   Physical Exam Vitals reviewed.  Constitutional:      Appearance: Normal appearance. He is normal weight.  HENT:     Right Ear: Tympanic membrane and ear canal normal.     Left Ear: Tympanic membrane and ear canal normal.     Nose: Nasal deformity, septal deviation and congestion present.  Cardiovascular:     Rate and Rhythm: Normal rate and regular rhythm.     Heart sounds: Murmur heard.  Pulmonary:     Effort: Pulmonary effort is normal. No respiratory  distress.     Breath sounds: Normal breath sounds. No wheezing or rhonchi.  Abdominal:     General: Bowel sounds are normal.     Palpations: Abdomen is soft.  Musculoskeletal:     Right lower leg: No edema.     Left lower leg: No edema.  Neurological:     Mental Status: He is alert.          Assessment & Plan:  Need for immunization against influenza - Plan: Flu Vaccine QUAD 48moIM (Fluarix, Fluzone & Alfiuria Quad PF)  Type 2 diabetes mellitus with other specified complication, without long-term current use of insulin (HCC) - Plan: Hemoglobin AL2G BASIC METABOLIC PANEL WITH GFR  Chronic pain syndrome I have recommended the patient get a humidifier in his bedroom.  I would also recommend resuming Flonase to see if this would help with congestion.  His blood pressure is low today.  I do not feel that the FIranor JVania Reawas causing hypoglycemia.  I suspect that he was having drops in blood pressure due to medication that he is taking.  If his A1c is elevated, I would like to try the patient back on Farxiga at least 5 mg a day.  I explained the reduction in cardiovascular death that the SGLT2 medications provide and patient is willing to try them again to the lower dose.  I will continue to prescribe oxycodone but the patient takes 1-2 times a day for back pain and knee pain and ankle pain.

## 2021-05-31 LAB — HEMOGLOBIN A1C
Hgb A1c MFr Bld: 6.4 % of total Hgb — ABNORMAL HIGH (ref <5.7)
Mean Plasma Glucose: 137 mg/dL
eAG (mmol/L): 7.6 mmol/L

## 2021-05-31 LAB — BASIC METABOLIC PANEL WITH GFR
BUN: 16 mg/dL (ref 7–25)
CO2: 33 mmol/L — ABNORMAL HIGH (ref 20–32)
Calcium: 9.2 mg/dL (ref 8.6–10.3)
Chloride: 93 mmol/L — ABNORMAL LOW (ref 98–110)
Creat: 1.17 mg/dL (ref 0.60–1.29)
Glucose, Bld: 141 mg/dL — ABNORMAL HIGH (ref 65–99)
Potassium: 4 mmol/L (ref 3.5–5.3)
Sodium: 136 mmol/L (ref 135–146)
eGFR: 77 mL/min/{1.73_m2} (ref >=60)

## 2021-06-01 ENCOUNTER — Other Ambulatory Visit: Payer: Self-pay | Admitting: Family Medicine

## 2021-06-01 ENCOUNTER — Other Ambulatory Visit: Payer: Self-pay

## 2021-06-01 MED ORDER — OXYCODONE HCL 5 MG PO TABS
5.0000 mg | ORAL_TABLET | Freq: Three times a day (TID) | ORAL | 0 refills | Status: DC | PRN
Start: 2021-06-01 — End: 2021-06-29

## 2021-06-01 MED ORDER — DAPAGLIFLOZIN PROPANEDIOL 5 MG PO TABS: 5.0000 mg | ORAL_TABLET | Freq: Every day | ORAL | 5 refills | Status: DC

## 2021-06-08 ENCOUNTER — Other Ambulatory Visit (HOSPITAL_COMMUNITY): Payer: Self-pay | Admitting: *Deleted

## 2021-06-08 DIAGNOSIS — I5022 Chronic systolic (congestive) heart failure: Secondary | ICD-10-CM

## 2021-06-08 MED ORDER — TORSEMIDE 20 MG PO TABS: 40.0000 mg | ORAL_TABLET | Freq: Every day | ORAL | 3 refills | Status: DC

## 2021-06-29 ENCOUNTER — Other Ambulatory Visit: Payer: Self-pay

## 2021-06-29 MED ORDER — OXYCODONE HCL 5 MG PO TABS: 5.0000 mg | ORAL_TABLET | Freq: Three times a day (TID) | ORAL | 0 refills | Status: DC | PRN

## 2021-06-29 NOTE — Telephone Encounter (Signed)
LOV 05/30/21 Last refill 06/01/21, #60, 0 refills  Please review, thanks!

## 2021-07-07 ENCOUNTER — Other Ambulatory Visit: Payer: Self-pay

## 2021-07-07 ENCOUNTER — Ambulatory Visit (INDEPENDENT_AMBULATORY_CARE_PROVIDER_SITE_OTHER): Payer: Medicare HMO

## 2021-07-07 VITALS — Ht 69.0 in | Wt 159.0 lb

## 2021-07-07 DIAGNOSIS — Z0000 Encounter for general adult medical examination without abnormal findings: Secondary | ICD-10-CM

## 2021-07-07 NOTE — Patient Instructions (Signed)
[name] , Thank you for taking time to come for your Medicare Wellness Visit. I appreciate your ongoing commitment to your health goals. Please review the following plan we discussed and let me know if I can assist you in the future.   Screening recommendations/referrals: Colonoscopy: Declined at this time.  Recommended yearly ophthalmology/optometry visit for glaucoma screening and checkup Recommended yearly dental visit for hygiene and checkup  Vaccinations: Influenza vaccine: Done 05/30/2021.  Pneumococcal vaccine: Done 11/01/2010. Tdap vaccine: Done 02/25/2017 Repeat in 10 years  Shingles vaccine: Due after age 35.   Covid-19: Done 08/16/2019, 09/23/2019 and 05/23/2020.  Advanced directives: Advance directive discussed with you today. Even though you declined this today, please call our office should you change your mind, and we can give you the proper paperwork for you to fill out.   Conditions/risks identified: Aim for 30 minutes of exercise or brisk walking each day, drink 6-8 glasses of water and eat lots of fruits and vegetables.   Next appointment: Follow up in one year for your annual wellness visit 2024.  Preventive Care 40-64 Years, Male Preventive care refers to lifestyle choices and visits with your health care provider that can promote health and wellness. What does preventive care include? A yearly physical exam. This is also called an annual well check. Dental exams once or twice a year. Routine eye exams. Ask your health care provider how often you should have your eyes checked. Personal lifestyle choices, including: Daily care of your teeth and gums. Regular physical activity. Eating a healthy diet. Avoiding tobacco and drug use. Limiting alcohol use. Practicing safe sex. Taking low-dose aspirin every day starting at age 52. What happens during an annual well check? The services and screenings done by your health care provider during your annual well check  will depend on your age, overall health, lifestyle risk factors, and family history of disease. Counseling  Your health care provider may ask you questions about your: Alcohol use. Tobacco use. Drug use. Emotional well-being. Home and relationship well-being. Sexual activity. Eating habits. Work and work Statistician. Screening  You may have the following tests or measurements: Height, weight, and BMI. Blood pressure. Lipid and cholesterol levels. These may be checked every 5 years, or more frequently if you are over 73 years old. Skin check. Lung cancer screening. You may have this screening every year starting at age 70 if you have a 30-pack-year history of smoking and currently smoke or have quit within the past 15 years. Fecal occult blood test (FOBT) of the stool. You may have this test every year starting at age 38. Flexible sigmoidoscopy or colonoscopy. You may have a sigmoidoscopy every 5 years or a colonoscopy every 10 years starting at age 103. Prostate cancer screening. Recommendations will vary depending on your family history and other risks. Hepatitis C blood test. Hepatitis B blood test. Sexually transmitted disease (STD) testing. Diabetes screening. This is done by checking your blood sugar (glucose) after you have not eaten for a while (fasting). You may have this done every 1-3 years. Discuss your test results, treatment options, and if necessary, the need for more tests with your health care provider. Vaccines  Your health care provider may recommend certain vaccines, such as: Influenza vaccine. This is recommended every year. Tetanus, diphtheria, and acellular pertussis (Tdap, Td) vaccine. You may need a Td booster every 10 years. Zoster vaccine. You may need this after age 65. Pneumococcal 13-valent conjugate (PCV13) vaccine. You may need this if you have  certain conditions and have not been vaccinated. Pneumococcal polysaccharide (PPSV23) vaccine. You may need one  or two doses if you smoke cigarettes or if you have certain conditions. Talk to your health care provider about which screenings and vaccines you need and how often you need them. This information is not intended to replace advice given to you by your health care provider. Make sure you discuss any questions you have with your health care provider. Document Released: 06/24/2015 Document Revised: 02/15/2016 Document Reviewed: 03/29/2015 Elsevier Interactive Patient Education  2017 [place] Prevention in the Home Falls can cause injuries. They can happen to people of all ages. There are many things you can do to make your home safe and to help prevent falls. What can I do on the outside of my home? Regularly fix the edges of walkways and driveways and fix any cracks. Remove anything that might make you trip as you walk through a door, such as a raised step or threshold. Trim any bushes or trees on the path to your home. Use bright outdoor lighting. Clear any walking paths of anything that might make someone trip, such as rocks or tools. Regularly check to see if handrails are loose or broken. Make sure that both sides of any steps have handrails. Any raised decks and porches should have guardrails on the edges. Have any leaves, snow, or ice cleared regularly. Use sand or salt on walking paths during winter. Clean up any spills in your garage right away. This includes oil or grease spills. What can I do in the bathroom? Use night lights. Install grab bars by the toilet and in the tub and shower. Do not use towel bars as grab bars. Use non-skid mats or decals in the tub or shower. If you need to sit down in the shower, use a plastic, non-slip stool. Keep the floor dry. Clean up any water that spills on the floor as soon as it happens. Remove soap buildup in the tub or shower regularly. Attach bath mats securely with double-sided non-slip rug tape. Do not have throw rugs and other  things on the floor that can make you trip. What can I do in the bedroom? Use night lights. Make sure that you have a light by your bed that is easy to reach. Do not use any sheets or blankets that are too big for your bed. They should not hang down onto the floor. Have a firm chair that has side arms. You can use this for support while you get dressed. Do not have throw rugs and other things on the floor that can make you trip. What can I do in the kitchen? Clean up any spills right away. Avoid walking on wet floors. Keep items that you use a lot in easy-to-reach places. If you need to reach something above you, use a strong step stool that has a grab bar. Keep electrical cords out of the way. Do not use floor polish or wax that makes floors slippery. If you must use wax, use non-skid floor wax. Do not have throw rugs and other things on the floor that can make you trip. What can I do with my stairs? Do not leave any items on the stairs. Make sure that there are handrails on both sides of the stairs and use them. Fix handrails that are broken or loose. Make sure that handrails are as long as the stairways. Check any carpeting to make sure that it is firmly attached  to the stairs. Fix any carpet that is loose or worn. Avoid having throw rugs at the top or bottom of the stairs. If you do have throw rugs, attach them to the floor with carpet tape. Make sure that you have a light switch at the top of the stairs and the bottom of the stairs. If you do not have them, ask someone to add them for you. What else can I do to help prevent falls? Wear shoes that: Do not have high heels. Have rubber bottoms. Are comfortable and fit you well. Are closed at the toe. Do not wear sandals. If you use a stepladder: Make sure that it is fully opened. Do not climb a closed stepladder. Make sure that both sides of the stepladder are locked into place. Ask someone to hold it for you, if possible. Clearly  mark and make sure that you can see: Any grab bars or handrails. First and last steps. Where the edge of each step is. Use tools that help you move around (mobility aids) if they are needed. These include: Canes. Walkers. Scooters. Crutches. Turn on the lights when you go into a dark area. Replace any light bulbs as soon as they burn out. Set up your furniture so you have a clear path. Avoid moving your furniture around. If any of your floors are uneven, fix them. If there are any pets around you, be aware of where they are. Review your medicines with your doctor. Some medicines can make you feel dizzy. This can increase your chance of falling. Ask your doctor what other things that you can do to help prevent falls. This information is not intended to replace advice given to you by your health care provider. Make sure you discuss any questions you have with your health care provider. Document Released: 03/24/2009 Document Revised: 11/03/2015 Document Reviewed: 07/02/2014 Elsevier Interactive Patient Education  2017 [employer].

## 2021-07-07 NOTE — Progress Notes (Signed)
Subjective:   [name] is a 51 y.o. male who presents for Medicare Annual/Subsequent preventive examination. Virtual Visit via Telephone Note  I connected with  LORCAN SHELP on 07/07/21 at 10:30 AM EST by telephone and verified that I am speaking with the correct person using two identifiers.  Location: Patient: HOME Provider: [facility] Persons participating in the virtual visit: patient/Nurse Health Advisor   I discussed the limitations, risks, security and privacy concerns of performing an evaluation and management service by telephone and the availability of in person appointments. The patient expressed understanding and agreed to proceed.  Interactive audio and video telecommunications were attempted between this nurse and patient, however failed, due to patient having technical difficulties OR patient did not have access to video capability.  We continued and completed visit with audio only.  Some vital signs may be absent or patient reported.   Chriss Driver, LPN  Review of Systems     Cardiac Risk Factors include: dyslipidemia;male gender;smoking/ tobacco exposure;sedentary lifestyle;Other (see comment), Risk factor comments: CHF, Wilms Tumor  PHONE VISIT. PT AT HOME. NURSE AT [facility].    Objective:    Today's Vitals   07/07/21 1033  Weight: 159 lb (72.1 kg)  Height: 5' 9"  (1.753 m)   Body mass index is 23.48 kg/m.  Advanced Directives 07/07/2021 09/08/2019 03/30/2019 01/29/2019 03/05/2018 06/08/2017 04/08/2017  Does Patient Have a Medical Advance Directive? No No No No No No No  Would patient like information on creating a medical advance directive? No - Patient declined No - Patient declined No - Patient declined No - Patient declined No - Patient declined No - Patient declined No - Patient declined  Pre-existing out of facility DNR order (yellow form or pink MOST form) - - - - - - -    Current Medications (verified) Outpatient Encounter Medications as of  07/07/2021  Medication Sig   aspirin 81 MG tablet Take 81 mg by mouth daily.   Blood Glucose Monitoring Suppl (BLOOD GLUCOSE SYSTEM PAK) KIT Please dispense based on patient and insurance preference. Use to monitor FSBS 2-3x weekly. Dx: E11.9.   carvedilol (COREG) 3.125 MG tablet TAKE 1 TABLET BY MOUTH TWICE DAILY WITH A MEAL   dapagliflozin propanediol (FARXIGA) 5 MG TABS tablet Take 1 tablet (5 mg total) by mouth daily before breakfast.   ENTRESTO 49-51 MG TAKE 1 TABLET BY MOUTH TWICE DAILY . APPOINTMENT REQUIRED FOR FUTURE REFILLS   fluticasone (FLONASE) 50 MCG/ACT nasal spray Place 2 sprays into both nostrils daily.   Glucose Blood (BLOOD GLUCOSE TEST STRIPS) STRP Please dispense based on patient and insurance preference. Use to monitor FSBS 2-3x weekly. Dx: E11.9.   Lancet Devices MISC Please dispense based on patient and insurance preference. Use to monitor FSBS 2-3x weekly. Dx: E11.9.   levocetirizine (XYZAL) 5 MG tablet TAKE 1 TABLET BY MOUTH ONCE DAILY IN THE EVENING   metolazone (ZAROXOLYN) 2.5 MG tablet TAKE 1 TABLET BY MOUTH AS DIRECTED BY  HEART  FAILURE  CLINIC   Multiple Vitamin (MULTIVITAMIN WITH MINERALS) TABS tablet Take 1 tablet by mouth daily.   oxyCODONE (OXY IR/ROXICODONE) 5 MG immediate release tablet Take 1 tablet (5 mg total) by mouth 3 (three) times daily as needed for severe pain.   potassium chloride SA (KLOR-CON) 20 MEQ tablet Take 1 tablet (20 mEq total) by mouth daily. Take an additional 23mq with Metolazone.   spironolactone (ALDACTONE) 25 MG tablet Take 1 tablet (25 mg total) by mouth  daily.   torsemide (DEMADEX) 20 MG tablet Take 2 tablets (40 mg total) by mouth daily. Take extra tab as needed for swelling   No facility-administered encounter medications on file as of 07/07/2021.    Allergies (verified) Ibuprofen and Nsaids   History: Past Medical History:  Diagnosis Date   AICD (automatic cardioverter/defibrillator) present    Arthritis     Cardiomyopathy, nonischemic (HCC)    takes Digoxin daily and Carvedilol daily   CHF (congestive heart failure) (HCC)    takes Lasix daily as well Aldactone   Chronic back pain    Chronic systolic heart failure (HCC)    Cirrhosis (HCC)    Depression    takes Prozac daily   GERD (gastroesophageal reflux disease)    takes Omeprazole daily   Hx of Alcohol consumption heavy    rare beer currently   hx of Tobacco abuse    quit smoking 2014   Hyperlipidemia    was on Simvastatin but has been off a year   Insomnia    takes Ambien as needed(just got script yesterday)   Orthostatic hypotension    Scoliosis    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 51 (XRT and chemo)   Past Surgical History:  Procedure Laterality Date   CARPAL TUNNEL RELEASE Right 01/09/2013   Procedure: CARPAL TUNNEL RELEASE;  Surgeon: Winfield Cunas, MD;  Location: [place] [facility];  Service: Neurosurgery;  Laterality: Right;  RIGHT carpal tunnel release   CARPAL TUNNEL RELEASE Left 01/30/2013   Procedure: LEFT CARPAL TUNNEL RELEASE;  Surgeon: Winfield Cunas, MD;  Location: [place] [facility];  Service: Neurosurgery;  Laterality: Left;  LEFT Carpal Tunnel release   CHEST TUBE INSERTION Right 09/09/2013   Procedure: INSERTION PLEURAL DRAINAGE CATHETER;  Surgeon: Ivin Poot, MD;  Location: [place];  Service: Thoracic;  Laterality: Right;   HYDROCELE EXCISION Right 04/11/2017   Procedure: RIGHT HYDROCELECTOMY ADULT;  Surgeon: Irine Seal, MD;  Location: [facility];  Service: Urology;  Laterality: Right;   hydrocelectomy  2008   ICD  05/25/2011   [facility] [employer] SG lead/[employer] single chamber device   IMPLANTABLE CARDIOVERTER DEFIBRILLATOR IMPLANT N/A 05/25/2011   Procedure: IMPLANTABLE CARDIOVERTER DEFIBRILLATOR IMPLANT;  Surgeon: Evans Lance, MD;  [facility];  Service: Cardiovascular;  Laterality: N/A;   NEPHRECTOMY  36 yrs ago   Wilms Tumor   RIGHT HEART CATH N/A 01/29/2019   Procedure: RIGHT HEART CATH;   Surgeon: Jolaine Artist, MD;  Location: [place] [facility] LAB;  Service: Cardiovascular;  Laterality: N/A;   RIGHT HEART CATH N/A 09/08/2019   Procedure: RIGHT HEART CATH;  Surgeon: Jolaine Artist, MD;  Location: [place] CV LAB;  Service: Cardiovascular;  Laterality: N/A;   RIGHT/LEFT HEART CATH AND CORONARY ANGIOGRAPHY N/A 02/12/2017   Procedure: RIGHT/LEFT HEART CATH AND CORONARY ANGIOGRAPHY;  Surgeon: Jolaine Artist, MD;  Location: [place] [facility] LAB;  Service: Cardiovascular;  Laterality: N/A;   ULNAR NERVE TRANSPOSITION Right 01/09/2013   Procedure: ULNAR NERVE DECOMPRESSION/TRANSPOSITION;  Surgeon: Winfield Cunas, MD;  Location: [place] [facility];  Service: Neurosurgery;  Laterality: Right;  RIGHT ulnar nerve decompression   Family History  Problem Relation Age of Onset   Diabetes Mother    Social History   Socioeconomic History   Marital status: Single    Spouse name: Not on file   Number of children: 1   Years of education: Not on file   Highest education level:  Not on file  Occupational History   Occupation: Full time    Comment: Engineer, civil (consulting)  Tobacco Use   Smoking status: Former    Packs/day: 0.25    Years: 20.00    Pack years: 5.00    Types: Cigarettes    Quit date: 09/07/2012    Years since quitting: 8.8   Smokeless tobacco: Current    Types: Snuff  Vaping Use   Vaping Use: Never used  Substance and Sexual Activity   Alcohol use: Yes    Alcohol/week: 0.0 standard drinks    Comment: histoorically a heavy drinker ("beer only"), none for a couple months    Drug use: No   Sexual activity: Not Currently  Other Topics Concern   Not on file  Social History Narrative   Not on file   Social Determinants of Health   Financial Resource Strain: Low Risk    Difficulty of Paying Living Expenses: Not hard at all  Food Insecurity: No Food Insecurity   Worried About Charity fundraiser in the Last Year: Never true   [place] in the Last Year: Never true   Transportation Needs: No Transportation Needs   Lack of Transportation (Medical): No   Lack of Transportation (Non-Medical): No  Physical Activity: Insufficiently Active   Days of Exercise per Week: 4 days   Minutes of Exercise per Session: 30 min  Stress: No Stress Concern Present   Feeling of Stress : Only a little  Social Connections: Moderately Integrated   Frequency of Communication with Friends and Family: More than three times a week   Frequency of Social Gatherings with Friends and Family: More than three times a week   Attends Religious Services: 1 to 4 times per year   Active Member of [employer] or Organizations: Yes   Attends Archivist Meetings: 1 to 4 times per year   Marital Status: Never married    Tobacco Counseling Ready to quit: Not Answered Counseling given: Not Answered   Clinical Intake:  Pre-visit preparation completed: Yes  Pain : No/denies pain     BMI - recorded: 23.48 Nutritional Status: BMI of 19-24  Normal Nutritional Risks: None Diabetes: No  How often do you need to have someone help you when you read instructions, pamphlets, or other written materials from your doctor or pharmacy?: 1 - Never  Diabetic?NO  Interpreter Needed?: No  Information entered by :: mj Samia Kukla,lpn   Activities of Daily Living In your present state of health, do you have any difficulty performing the following activities: 07/07/2021  Hearing? N  Vision? N  Difficulty concentrating or making decisions? N  Walking or climbing stairs? N  Dressing or bathing? N  Doing errands, shopping? N  Preparing Food and eating ? N  Using the Toilet? N  In the past six months, have you accidently leaked urine? N  Do you have problems with loss of bowel control? N  Managing your Medications? N  Managing your Finances? N  Housekeeping or managing your Housekeeping? N  Some recent data might be hidden    Patient Care Team: Susy Frizzle, MD as PCP - General  (Family Medicine) Evans Lance, MD as Consulting Physician (Cardiology)  Indicate any recent Medical Services you may have received from other than [facility] providers in the past year (date may be approximate).     Assessment:   This is a routine wellness examination for X[place].  Hearing/Vision screen Hearing Screening - Comments::  No hearing issues.  Vision Screening - Comments:: Has eye appointment scheduled.   Dietary issues and exercise activities discussed: Current Exercise Habits: Home exercise routine, Type of exercise: walking, Time (Minutes): 30, Frequency (Times/Week): 4, Weekly Exercise (Minutes/Week): 120, Intensity: Mild, Exercise limited by: cardiac condition(s)   Goals Addressed             This Visit's Progress    Exercise 3x per week (30 min per time)       Keep exercising and staying healthy.       Depression Screen PHQ 2/9 Scores 07/07/2021 11/08/2020 07/11/2020 04/08/2020 09/17/2019 08/07/2019 03/30/2019  PHQ - 2 Score 0 0 0 0 0 0 0  PHQ- 9 Score - - - - - - -    Fall Risk Fall Risk  07/07/2021 11/08/2020 07/11/2020 04/08/2020 09/17/2019  Falls in the past year? 0 1 1 - 0  Number falls in past yr: 0 0 0 0 0  Comment - - slpped on ice - -  Injury with Fall? 0 0 0 0 0  Risk for fall due to : No Fall Risks No Fall Risks No Fall Risks - -  Follow up Falls prevention discussed Falls evaluation completed Falls evaluation completed Falls evaluation completed -    FALL RISK PREVENTION PERTAINING TO THE HOME:  Any stairs in or around the home? Yes  If so, are there any without handrails? No  Home free of loose throw rugs in walkways, pet beds, electrical cords, etc? Yes  Adequate lighting in your home to reduce risk of falls? Yes   ASSISTIVE DEVICES UTILIZED TO PREVENT FALLS:  Life alert? No  Use of a cane, walker or w/c? No  Grab bars in the bathroom? No  Shower chair or bench in shower? No  Elevated toilet seat or a handicapped toilet? No   TIMED UP AND  GO:  Was the test performed? No .  Phone visit.  Cognitive Function:     6CIT Screen 07/07/2021 03/05/2018  What Year? 0 points 0 points  What month? 0 points 0 points  What time? 0 points -  Count back from 20 0 points 0 points  Months in reverse 0 points 2 points  Repeat phrase 0 points 2 points  Total Score 0 -    Immunizations Immunization History  Administered Date(s) Administered   Hep A / Hep B 03/30/2019, 02/18/2020, 03/21/2020, 08/22/2020   Influenza Split 05/26/2011, 02/29/2012   Influenza,inj,Quad PF,6+ Mos 03/16/2014, 03/18/2015, 04/25/2016, 02/25/2017, 03/05/2018, 03/30/2019, 04/08/2020, 05/30/2021   Influenza-Unspecified 03/16/2014, 03/18/2015, 04/25/2016, 02/25/2017, 03/05/2018, 03/30/2019   Moderna Sars-Covid-2 Vaccination 08/16/2019, 09/23/2019, 05/23/2020   Pneumococcal Polysaccharide-23 11/01/2010   Tdap 02/25/2017    TDAP status: Up to date  Flu Vaccine status: Up to date  Pneumococcal vaccine status: Up to date  Covid-19 vaccine status: Completed vaccines  Qualifies for Shingles Vaccine? No   Zostavax completed  N/A   Shingrix Completed?: No.    Education has been provided regarding the importance of this vaccine. Patient has been advised to call insurance company to determine out of pocket expense if they have not yet received this vaccine. Advised may also receive vaccine at local pharmacy or Health Dept. Verbalized acceptance and understanding.  Screening Tests Health Maintenance  Topic Date Due   OPHTHALMOLOGY EXAM  Never done   COLONOSCOPY (Pts 45-35yr Insurance coverage will need to be confirmed)  Never done   COVID-19 Vaccine (4 - Booster for Moderna series) 07/18/2020   FOOT EXAM  11/08/2021   HEMOGLOBIN A1C  11/28/2021   TETANUS/TDAP  02/26/2027   INFLUENZA VACCINE  Completed   Hepatitis C Screening  Completed   HIV Screening  Completed   HPV VACCINES  Aged Out    Health Maintenance  Health Maintenance Due  Topic Date Due    OPHTHALMOLOGY EXAM  Never done   COLONOSCOPY (Pts 45-88yr Insurance coverage will need to be confirmed)  Never done   COVID-19 Vaccine (4 - Booster for Moderna series) 07/18/2020    Colorectal cancer screening: Referral to GI placed Pt states he is coordinating with his Cardiologist to schedule..Marland KitchenPt aware the office will call re: appt.  Lung Cancer Screening: (Low Dose CT Chest recommended if Age 49-80years, 30 pack-year currently smoking OR have quit w/in 15years.) does not qualify.  Additional Screening:  Hepatitis C Screening: does not qualify;  Vision Screening: Recommended annual ophthalmology exams for early detection of glaucoma and other disorders of the eye. Is the patient up to date with their annual eye exam?  No  Who is the provider or what is the name of the office in which the patient attends annual eye exams? Pt states he is in process of scheduling appointment.  If pt is not established with a provider, would they like to be referred to a provider to establish care? No .   Dental Screening: Recommended annual dental exams for proper oral hygiene  Community Resource Referral / Chronic Care Management: CRR required this visit?  No   CCM required this visit?  No      Plan:     I have personally reviewed and noted the following in the patients chart:   Medical and social history Use of alcohol, tobacco or illicit drugs  Current medications and supplements including opioid prescriptions. Patient is currently taking opioid prescriptions. Information provided to patient regarding non-opioid alternatives. Patient advised to discuss non-opioid treatment plan with their provider. Functional ability and status Nutritional status Physical activity Advanced directives List of other physicians Hospitalizations, surgeries, and ER visits in previous 12 months Vitals Screenings to include cognitive, depression, and falls Referrals and appointments  In addition, I have  reviewed and discussed with patient certain preventive protocols, quality metrics, and best practice recommendations. A written personalized care plan for preventive services as well as general preventive health recommendations were provided to patient.     MChriss Driver LPN   11/44/[id number]  Nurse Notes: Discussed Colonosocopy. Pt states he is coordinating with Cardiologist to schedule.

## 2021-07-30 NOTE — Progress Notes (Signed)
Patient ID: [name], male   DOB: 1970/06/24, 51 y.o.   MRN: [id number]   ADVANCED HF CLINIC NOTE    Primary Cardiologist: Dr Domenic Polite HF MD: Dr Haroldine Laws [name]: Dr Mosetta Pigeon  HPI: [name] is 51 year old male with history of NICM (diagnosed 10/2010 - norm cors 2012 and 4/40), chronic systolic heart failure EF 15% (08/2013), S/P ICD [employer], smoker, Wilms tumor s/p nephrectomy at age 54 had chemoradiation, scoliosis.    We saw him at the end of March 2015 and had large recurrent R pleural effusion. Referred to Dr. Prescott Gum who placed Pleurex catheter on 4/10, removed on 09/28/13.   CPX (5/15) with RER 1.06, VO2 max 18.5, VE/VCO2 slope 32.1.  Moderate to severe functional limitation, circulatory.   CPX 09/17/16 Peak VO2: 20.5 (53% predicted peak VO2) Slope 35 Underwent L/RHC on 02/12/17. Had normal coronaries and relatively well-compensated hemodynamics. EF 20%.  Echo 6/20 EF 15% RV mild HK with severe TR Personally reviewed  CPX 8/20 with pVO2 17.1 (down from 20.5)  Slope 35 (stable).   CT abdomen 10/20 c/w cirrhosis.  Paracentesis 03/02/19: 3.7 L off. No need for repeat paracenteses  Has been evaluated at [facility] by Dr Mosetta Pigeon for possible transplant. Seen last in 4/21 - felt too early for transplant but getting closer.  Here for routine f/u. Says he feels good. Denies edema, orthopnea or PND. Was rabbit hunting last month and walked several miles. Compliant with meds. He cut Farxiga from 10 to 5 as he could tolerate better. BP ok. No dizzines.  Echo 12/17/20: LVEF < 20% RV normal severe TR Personally reviewed   CPX 12/17/20 FVC 2.41 (49%)       FEV1 1.51 (38%)         FEV1/FVC 63 (78%)         MVV 62 (40%)       BP rest: 106/60 Standing BP: 100/62 BP peak: 104/62  Peak VO2: 18.3 (50% predicted peak VO2)  VE/VCO2 slope:  38  Peak RER: 1.10  Ventilatory Threshold: 15.6 (42% predicted or measured peak VO2)  VE/MVV:  84%     Echo 01/29/19   RA = 17 RV = 46/18 PA =  51/22 (33) PCW = 28 Fick cardiac output/index =4.0/2.2 PVR =1.0 WU Ao sat = 99% PA sat = 62%, 62% RA/PCWP = 0.61 PaPI = 1.71  CPX 01/15/19   FVC 2.85 (57%)      FEV1 2.04 (51%)        FEV1/FVC 72 (89%)        MVV 79 (50%)      Resting HR: 96 Peak HR: 157   (90% age predicted max HR)  BP rest: 92/64 BP peak: 100/58  Peak VO2: 17.7 (47% predicted peak VO2)  VE/VCO2 slope:  35  OUES: 1.30  Peak RER: 1.18  Ventilatory Threshold: 14.0 (37% predicted or measured peak VO2 and 79% measured PVO2)  VE/MVV:  58%  O2pulse:  8   (57% predicted O2pulse)    ECHO 04/2014 EF 10% RV mildly dilated.   ECHO 2/18 EF 15% Mild Richard RV mild HK. Severe TR. Triv Richard . RV ok Echo 11/2016  EF 15% RV ok. Moderate to severe TR  Cath 02/12/17:  Normal cors  Ao = 91/60 (74) LV = 102/18 RA =  2 RV = 48/5 PA = 48/21 (31) PCW = 20 Fick cardiac output/index = 4.0/2.3 PVR = 2.8 WU Ao sat = 95% PA sat =  68%, 70% RA/PCWP = 0.10 PaPI = 13.5   CPX 09/17/16 Resting HR: 86 Peak HR: 150   (85% age predicted max HR) BP rest: 96/80 BP peak: 118/72 Peak VO2: 20.5 (53% predicted peak VO2) Ve/VCO2 slope:  35 OUES: 1.38 Peak RER: 1.04 Ventilatory Threshold: 15.7 (41% predicted or measured peak VO2) Peak RR 32 Peak Ventilation:  47.5 VE/MVV:  50% PETCO2 at peak:  30 O2pulse:  8   (62% predicted O2pulse)  CPX 5/15 Resting HR: 89 Peak HR: 138   (77% age predicted max HR) BP rest: 106/68 BP peak: 126/74 (IPE) Peak VO2: 18.5 (46.5% predicted peak VO2) VE/VCO2 slope:  32.1 OUES: 1.33 Peak RER: 1.06 Ventilatory Threshold: 13.5 (33.9% predicted peak VO2) Peak RR 31 Peak Ventilation:  37.1 VE/MVV:  62.7% PETCO2 at peak:  33 O2pulse:  7   (58% predicted O2pulse)  SH: Lives with his son 44 year old . Unemployed. Disability approved 3 years ago. Does not drink alcohol. Dips tobacco.   FH: Grandfather MI  ROS: All systems negative except as listed in HPI, PMH and Problem List.  Past Medical History:   Diagnosis Date   AICD (automatic cardioverter/defibrillator) present    Arthritis    Cardiomyopathy, nonischemic ([place])    takes Digoxin daily and Carvedilol daily   CHF (congestive heart failure) (HCC)    takes Lasix daily as well Aldactone   Chronic back pain    Chronic systolic heart failure (HCC)    Cirrhosis ([place])    Depression    takes Prozac daily   GERD (gastroesophageal reflux disease)    takes Omeprazole daily   Hx of Alcohol consumption heavy    rare beer currently   hx of Tobacco abuse    quit smoking 2014   Hyperlipidemia    was on Simvastatin but has been off a year   Insomnia    takes Ambien as needed(just got script yesterday)   Orthostatic hypotension    Scoliosis    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 83 (XRT and chemo)    Current Outpatient Medications  Medication Sig Dispense Refill   aspirin 81 MG tablet Take 81 mg by mouth daily.     Blood Glucose Monitoring Suppl (BLOOD GLUCOSE SYSTEM PAK) KIT Please dispense based on patient and insurance preference. Use to monitor FSBS 2-3x weekly. Dx: E11.9. 1 each 1   carvedilol (COREG) 3.125 MG tablet TAKE 1 TABLET BY MOUTH TWICE DAILY WITH A MEAL 180 tablet 0   dapagliflozin propanediol (FARXIGA) 5 MG TABS tablet Take 1 tablet (5 mg total) by mouth daily before breakfast. 30 tablet 5   ENTRESTO 49-51 MG TAKE 1 TABLET BY MOUTH TWICE DAILY . APPOINTMENT REQUIRED FOR FUTURE REFILLS 180 tablet 0   fluticasone (FLONASE) 50 MCG/ACT nasal spray Place 2 sprays into both nostrils daily. 16 g 6   Glucose Blood (BLOOD GLUCOSE TEST STRIPS) STRP Please dispense based on patient and insurance preference. Use to monitor FSBS 2-3x weekly. Dx: E11.9. 50 each 1   Lancet Devices MISC Please dispense based on patient and insurance preference. Use to monitor FSBS 2-3x weekly. Dx: E11.9. 1 each 1   metolazone (ZAROXOLYN) 2.5 MG tablet TAKE 1 TABLET BY MOUTH AS DIRECTED BY  HEART  FAILURE  CLINIC 15 tablet 0   Multiple Vitamin (MULTIVITAMIN  WITH MINERALS) TABS tablet Take 1 tablet by mouth daily.     oxyCODONE (OXY IR/ROXICODONE) 5 MG immediate release tablet Take 1 tablet (5 mg total)  by mouth 3 (three) times daily as needed for severe pain. 60 tablet 0   potassium chloride SA (KLOR-CON) 20 MEQ tablet Take 1 tablet (20 mEq total) by mouth daily. Take an additional 77mq with Metolazone. 90 tablet 3   spironolactone (ALDACTONE) 25 MG tablet Take 1 tablet (25 mg total) by mouth daily. 90 tablet 3   torsemide (DEMADEX) 20 MG tablet Take 2 tablets (40 mg total) by mouth daily. Take extra tab as needed for swelling 135 tablet 3   No current facility-administered medications for this encounter.     PHYSICAL EXAM: Vitals:   08/01/21 1010  BP: 102/60  Pulse: 85  SpO2: 100%  Weight: 68.7 kg (151 lb 6.4 oz)    Filed Weights   08/01/21 1010  Weight: 68.7 kg (151 lb 6.4 oz)    General:  Well appearing. No resp difficulty HEENT: normal Neck: supple. JVP 8-9 with prominent v waves. Carotids 2+ bilat; no bruits. No lymphadenopathy or thryomegaly appreciated. Cor: PMI nondisplaced. Regular rate & rhythm. No rubs, gallops or murmurs. Lungs: clear Abdomen: soft, nontender, nondistended. No hepatosplenomegaly. No bruits or masses. Good bowel sounds. Extremities: no cyanosis, clubbing, rash, edema Neuro: alert & orientedx3, cranial nerves grossly intact. moves all 4 extremities w/o difficulty. Affect pleasant   ICD interrogation:  No VT/AF. Activity level 1.9 hr/day Personally reviewed  ASSESSMENT & PLAN:  1. Chronic Systolic HF:  Nonischemic cardiomyopathy, ECHO 03/2014 EF 10%. Echo 2/18 EF 15% RV mild HK  [employer] ICD.   - Echo 6/20 EF 15% RV normal severe TR - CPX test 8/20 with significant decline form previous.  - Cath 9/18: normal cors. Relatively well-compensated hemodynmaics - W/u has revealed evidence of cirrhosis which I suspect is due to a combination of ETOH and RV failure/severe TR. Synthetic function  currently seems ok (Albumin 4.2  INR 1.3 on 9/20) - I doubt TV repair will be sufficient for him so question will be if he will still be a transplant candidate. GI has seen and feels liver disease likely not severe enough to be a barrier to transplant.  May need [facility] team to weigh in as well - He saw Dr. DMosetta Pigeonin 4/21 who felt we would need to proceed with advanced therapies sooner than later in his disease given cirrhosis but still felt he was a bit early. Recommended close f/u with CPX - Symptomatically stable NYHA II - Volume status mildly elevated on torsemide 240 daily + PRN - Continue entresto to 49/51. Unable to tolerate 97/103 - Failed titration on carvedilol to 3.125/6.25. Now back on 3.125 bid - Continue spiro 25 daily  - Continue Farxiga 5 - Unable to tolerate digoxin due to heartburn - blood type A-pos - Doing fairly well. CPX 7/22 was stable. Flat BP response. Was more lung limited on that test, Continue close monitoring. Be careful not to wait too long. Will follow closely need to be careful not to miss his window. Consider chest CT to get better understanding of why spirometry was down on last CPX. - Stable continue to follow closely  2. Severe TR - Echo with moderate to severe TR but TV seems structurally intact. ? If related to ICD wire or just dilated annulus.  - I doubt TV repair will be sufficient for him so question will be if he will still be a transplant candidate.  3. Cirrhosis - w/u as above - appreciate GI input - due to RV failure.  - stable. No ascites  on exam    Glori Bickers, MD  10:24 AM

## 2021-07-31 ENCOUNTER — Other Ambulatory Visit: Payer: Self-pay

## 2021-07-31 NOTE — Telephone Encounter (Signed)
LOV 05/30/21 Last refill 06/29/21, #60, 0 refills  Please review, thanks!

## 2021-08-01 ENCOUNTER — Other Ambulatory Visit: Payer: Self-pay

## 2021-08-01 ENCOUNTER — Ambulatory Visit (HOSPITAL_COMMUNITY)
Admission: RE | Admit: 2021-08-01 | Discharge: 2021-08-01 | Disposition: A | Discharge status: Home / Self Care | Payer: Medicare HMO | Source: Ambulatory Visit | Attending: Internal Medicine | Admitting: Internal Medicine

## 2021-08-01 ENCOUNTER — Encounter (HOSPITAL_COMMUNITY): Payer: Self-pay | Admitting: Internal Medicine

## 2021-08-01 VITALS — BP 102/60 | HR 85 | Wt 151.4 lb

## 2021-08-01 DIAGNOSIS — Z85528 Personal history of other malignant neoplasm of kidney: Secondary | ICD-10-CM | POA: Diagnosis not present

## 2021-08-01 DIAGNOSIS — I5022 Chronic systolic (congestive) heart failure: Secondary | ICD-10-CM

## 2021-08-01 DIAGNOSIS — K7469 Other cirrhosis of liver: Secondary | ICD-10-CM | POA: Diagnosis not present

## 2021-08-01 DIAGNOSIS — Z923 Personal history of irradiation: Secondary | ICD-10-CM | POA: Insufficient documentation

## 2021-08-01 DIAGNOSIS — I071 Rheumatic tricuspid insufficiency: Secondary | ICD-10-CM | POA: Diagnosis not present

## 2021-08-01 DIAGNOSIS — I428 Other cardiomyopathies: Secondary | ICD-10-CM | POA: Insufficient documentation

## 2021-08-01 DIAGNOSIS — K7460 Unspecified cirrhosis of liver: Secondary | ICD-10-CM | POA: Insufficient documentation

## 2021-08-01 DIAGNOSIS — Z79899 Other long term (current) drug therapy: Secondary | ICD-10-CM | POA: Insufficient documentation

## 2021-08-01 DIAGNOSIS — Z95810 Presence of automatic (implantable) cardiac defibrillator: Secondary | ICD-10-CM | POA: Insufficient documentation

## 2021-08-01 DIAGNOSIS — Z905 Acquired absence of kidney: Secondary | ICD-10-CM | POA: Diagnosis not present

## 2021-08-01 LAB — CBC
HCT: 44.2 % (ref 39.0–52.0)
Hemoglobin: 14.5 g/dL (ref 13.0–17.0)
MCH: 31.6 pg (ref 26.0–34.0)
MCHC: 32.8 g/dL (ref 30.0–36.0)
MCV: 96.3 fL (ref 80.0–100.0)
Platelets: 283 10*3/uL (ref 150–400)
RBC: 4.59 MIL/uL (ref 4.22–5.81)
RDW: 15.9 % — ABNORMAL HIGH (ref 11.5–15.5)
WBC: 3.9 10*3/uL — ABNORMAL LOW (ref 4.0–10.5)
nRBC: 0 % (ref 0.0–0.2)

## 2021-08-01 LAB — COMPREHENSIVE METABOLIC PANEL
ALT: 18 U/L (ref 0–44)
AST: 32 U/L (ref 15–41)
Albumin: 3.4 g/dL — ABNORMAL LOW (ref 3.5–5.0)
Alkaline Phosphatase: 269 U/L — ABNORMAL HIGH (ref 38–126)
Anion gap: 10 (ref 5–15)
BUN: 11 mg/dL (ref 6–20)
CO2: 34 mmol/L — ABNORMAL HIGH (ref 22–32)
Calcium: 8.9 mg/dL (ref 8.9–10.3)
Chloride: 91 mmol/L — ABNORMAL LOW (ref 98–111)
Creatinine, Ser: 1.18 mg/dL (ref 0.61–1.24)
GFR, Estimated: >60 mL/min (ref >60)
Glucose, Bld: 138 mg/dL — ABNORMAL HIGH (ref 70–99)
Potassium: 3.5 mmol/L (ref 3.5–5.1)
Sodium: 135 mmol/L (ref 135–145)
Total Bilirubin: 1.5 mg/dL — ABNORMAL HIGH (ref 0.3–1.2)
Total Protein: 7.3 g/dL (ref 6.5–8.1)

## 2021-08-01 LAB — BRAIN NATRIURETIC PEPTIDE: B Natriuretic Peptide: >4500 pg/mL — ABNORMAL HIGH (ref 0.0–100.0)

## 2021-08-01 MED ORDER — DAPAGLIFLOZIN PROPANEDIOL 5 MG PO TABS: 5.0000 mg | ORAL_TABLET | Freq: Every day | ORAL | 11 refills | Status: DC

## 2021-08-01 MED ORDER — OXYCODONE HCL 5 MG PO TABS: 5.0000 mg | ORAL_TABLET | Freq: Three times a day (TID) | ORAL | 0 refills | Status: DC | PRN

## 2021-08-01 NOTE — Patient Instructions (Addendum)
Labs today We will only contact you if something comes back abnormal or we need to make some changes. Otherwise no news is good news!  A refill for [name] was sent to your pharmacy!  Your physician has requested that you have an echocardiogram. Echocardiography is a painless test that uses sound waves to create images of your heart. It provides your doctor with information about the size and shape of your heart and how well your hearts chambers and valves are working. This procedure takes approximately one hour. There are no restrictions for this procedure.  Your physician recommends that you schedule a follow-up appointment in: 6 months with an echo   Please call office at [phone] option [phone] if you have any questions or concerns.   At the [facility], you and your health needs are our priority. As part of our continuing mission to provide you with exceptional heart care, we have created designated Provider Care Teams. These Care Teams include your primary Cardiologist (physician) and Advanced Practice Providers (APPs- Physician Assistants and Nurse Practitioners) who all work together to provide you with the care you need, when you need it.   You may see any of the following providers on your designated Care Team at your next follow up: Dr [name] Dr Richard Kerns, NP [name], [place] [name] [name], [place] [name], PharmD   Please be sure to bring in all your medications bottles to every appointment.

## 2021-08-10 ENCOUNTER — Ambulatory Visit (INDEPENDENT_AMBULATORY_CARE_PROVIDER_SITE_OTHER): Payer: Medicare HMO

## 2021-08-10 DIAGNOSIS — I420 Dilated cardiomyopathy: Secondary | ICD-10-CM

## 2021-08-11 LAB — CUP PACEART REMOTE DEVICE CHECK
Battery Remaining Longevity: 42 mo
Battery Remaining Percentage: 46 %
Brady Statistic RV Percent Paced: 0 %
Date Time Interrogation Session: 20230303101100
HighPow Impedance: 79 Ohm
Implantable Lead Implant Date: 20121214
Implantable Lead Location: 753860
Implantable Lead Model: 180
Implantable Lead Serial Number: 307189
Implantable Pulse Generator Implant Date: 20121214
Lead Channel Impedance Value: 597 Ohm
Lead Channel Pacing Threshold Amplitude: 0.7 V
Lead Channel Pacing Threshold Pulse Width: 0.4 ms
Lead Channel Setting Pacing Amplitude: 2.4 V
Lead Channel Setting Pacing Pulse Width: 0.4 ms
Lead Channel Setting Sensing Sensitivity: 0.4 mV
Pulse Gen Serial Number: 118994

## 2021-08-17 NOTE — Progress Notes (Signed)
Remote ICD transmission.   

## 2021-08-28 ENCOUNTER — Other Ambulatory Visit: Payer: Self-pay

## 2021-08-28 NOTE — Telephone Encounter (Signed)
LOV 05/30/21 ?Last refill 08/01/21, #60, 0 refills ? ?Please review, thanks!  ?

## 2021-08-29 MED ORDER — OXYCODONE HCL 5 MG PO TABS: 5.0000 mg | ORAL_TABLET | Freq: Three times a day (TID) | ORAL | 0 refills | Status: DC | PRN

## 2021-09-03 ENCOUNTER — Emergency Department (HOSPITAL_COMMUNITY): Payer: Medicare HMO

## 2021-09-03 ENCOUNTER — Inpatient Hospital Stay (HOSPITAL_COMMUNITY)
Admission: EM | Admit: 2021-09-03 | Discharge: 2021-09-05 | DRG: 291 | Disposition: A | Discharge status: Home / Self Care | Payer: Medicare HMO | Attending: Internal Medicine | Admitting: Internal Medicine

## 2021-09-03 ENCOUNTER — Encounter (HOSPITAL_COMMUNITY): Payer: Self-pay

## 2021-09-03 ENCOUNTER — Other Ambulatory Visit: Payer: Self-pay

## 2021-09-03 DIAGNOSIS — E876 Hypokalemia: Secondary | ICD-10-CM | POA: Diagnosis not present

## 2021-09-03 DIAGNOSIS — I248 Other forms of acute ischemic heart disease: Secondary | ICD-10-CM | POA: Diagnosis not present

## 2021-09-03 DIAGNOSIS — I5023 Acute on chronic systolic (congestive) heart failure: Secondary | ICD-10-CM | POA: Diagnosis not present

## 2021-09-03 DIAGNOSIS — I5043 Acute on chronic combined systolic (congestive) and diastolic (congestive) heart failure: Principal | ICD-10-CM | POA: Diagnosis present

## 2021-09-03 DIAGNOSIS — G4700 Insomnia, unspecified: Secondary | ICD-10-CM | POA: Diagnosis present

## 2021-09-03 DIAGNOSIS — Z85528 Personal history of other malignant neoplasm of kidney: Secondary | ICD-10-CM

## 2021-09-03 DIAGNOSIS — K7469 Other cirrhosis of liver: Secondary | ICD-10-CM

## 2021-09-03 DIAGNOSIS — E871 Hypo-osmolality and hyponatremia: Secondary | ICD-10-CM | POA: Diagnosis not present

## 2021-09-03 DIAGNOSIS — M1990 Unspecified osteoarthritis, unspecified site: Secondary | ICD-10-CM | POA: Diagnosis present

## 2021-09-03 DIAGNOSIS — Z95810 Presence of automatic (implantable) cardiac defibrillator: Secondary | ICD-10-CM | POA: Diagnosis not present

## 2021-09-03 DIAGNOSIS — I361 Nonrheumatic tricuspid (valve) insufficiency: Secondary | ICD-10-CM | POA: Diagnosis present

## 2021-09-03 DIAGNOSIS — K7030 Alcoholic cirrhosis of liver without ascites: Secondary | ICD-10-CM | POA: Diagnosis present

## 2021-09-03 DIAGNOSIS — Z923 Personal history of irradiation: Secondary | ICD-10-CM

## 2021-09-03 DIAGNOSIS — J129 Viral pneumonia, unspecified: Secondary | ICD-10-CM | POA: Diagnosis not present

## 2021-09-03 DIAGNOSIS — Z20822 Contact with and (suspected) exposure to covid-19: Secondary | ICD-10-CM | POA: Diagnosis present

## 2021-09-03 DIAGNOSIS — R051 Acute cough: Secondary | ICD-10-CM | POA: Diagnosis not present

## 2021-09-03 DIAGNOSIS — K7460 Unspecified cirrhosis of liver: Secondary | ICD-10-CM | POA: Diagnosis present

## 2021-09-03 DIAGNOSIS — F419 Anxiety disorder, unspecified: Secondary | ICD-10-CM | POA: Diagnosis present

## 2021-09-03 DIAGNOSIS — Z9221 Personal history of antineoplastic chemotherapy: Secondary | ICD-10-CM

## 2021-09-03 DIAGNOSIS — J9601 Acute respiratory failure with hypoxia: Secondary | ICD-10-CM | POA: Diagnosis not present

## 2021-09-03 DIAGNOSIS — R69 Illness, unspecified: Secondary | ICD-10-CM | POA: Diagnosis not present

## 2021-09-03 DIAGNOSIS — M549 Dorsalgia, unspecified: Secondary | ICD-10-CM | POA: Diagnosis present

## 2021-09-03 DIAGNOSIS — E785 Hyperlipidemia, unspecified: Secondary | ICD-10-CM | POA: Diagnosis present

## 2021-09-03 DIAGNOSIS — K2100 Gastro-esophageal reflux disease with esophagitis, without bleeding: Secondary | ICD-10-CM

## 2021-09-03 DIAGNOSIS — D649 Anemia, unspecified: Secondary | ICD-10-CM | POA: Diagnosis present

## 2021-09-03 DIAGNOSIS — I110 Hypertensive heart disease with heart failure: Secondary | ICD-10-CM | POA: Diagnosis not present

## 2021-09-03 DIAGNOSIS — K219 Gastro-esophageal reflux disease without esophagitis: Secondary | ICD-10-CM | POA: Diagnosis present

## 2021-09-03 DIAGNOSIS — R0602 Shortness of breath: Secondary | ICD-10-CM | POA: Diagnosis not present

## 2021-09-03 DIAGNOSIS — I959 Hypotension, unspecified: Secondary | ICD-10-CM | POA: Diagnosis not present

## 2021-09-03 DIAGNOSIS — J90 Pleural effusion, not elsewhere classified: Secondary | ICD-10-CM | POA: Diagnosis not present

## 2021-09-03 DIAGNOSIS — Z79899 Other long term (current) drug therapy: Secondary | ICD-10-CM | POA: Diagnosis not present

## 2021-09-03 DIAGNOSIS — I420 Dilated cardiomyopathy: Secondary | ICD-10-CM | POA: Diagnosis present

## 2021-09-03 DIAGNOSIS — Z7982 Long term (current) use of aspirin: Secondary | ICD-10-CM | POA: Diagnosis not present

## 2021-09-03 DIAGNOSIS — Z886 Allergy status to analgesic agent status: Secondary | ICD-10-CM

## 2021-09-03 DIAGNOSIS — K7031 Alcoholic cirrhosis of liver with ascites: Secondary | ICD-10-CM | POA: Diagnosis not present

## 2021-09-03 DIAGNOSIS — I517 Cardiomegaly: Secondary | ICD-10-CM | POA: Diagnosis not present

## 2021-09-03 DIAGNOSIS — F32A Depression, unspecified: Secondary | ICD-10-CM | POA: Diagnosis present

## 2021-09-03 DIAGNOSIS — R059 Cough, unspecified: Secondary | ICD-10-CM | POA: Diagnosis not present

## 2021-09-03 DIAGNOSIS — Z905 Acquired absence of kidney: Secondary | ICD-10-CM

## 2021-09-03 DIAGNOSIS — G8929 Other chronic pain: Secondary | ICD-10-CM | POA: Diagnosis present

## 2021-09-03 DIAGNOSIS — Z720 Tobacco use: Secondary | ICD-10-CM

## 2021-09-03 LAB — COMPREHENSIVE METABOLIC PANEL
ALT: 19 U/L (ref 0–44)
AST: 38 U/L (ref 15–41)
Albumin: 3.1 g/dL — ABNORMAL LOW (ref 3.5–5.0)
Alkaline Phosphatase: 255 U/L — ABNORMAL HIGH (ref 38–126)
Anion gap: 13 (ref 5–15)
BUN: 14 mg/dL (ref 6–20)
CO2: 27 mmol/L (ref 22–32)
Calcium: 8.1 mg/dL — ABNORMAL LOW (ref 8.9–10.3)
Chloride: 89 mmol/L — ABNORMAL LOW (ref 98–111)
Creatinine, Ser: 1.03 mg/dL (ref 0.61–1.24)
GFR, Estimated: >60 mL/min (ref >60)
Glucose, Bld: 98 mg/dL (ref 70–99)
Potassium: 3.4 mmol/L — ABNORMAL LOW (ref 3.5–5.1)
Sodium: 129 mmol/L — ABNORMAL LOW (ref 135–145)
Total Bilirubin: 1.3 mg/dL — ABNORMAL HIGH (ref 0.3–1.2)
Total Protein: 6.7 g/dL (ref 6.5–8.1)

## 2021-09-03 LAB — CBC WITH DIFFERENTIAL/PLATELET
Abs Immature Granulocytes: 0.01 10*3/uL (ref 0.00–0.07)
Basophils Absolute: 0 10*3/uL (ref 0.0–0.1)
Basophils Relative: 0 %
Eosinophils Absolute: 0 10*3/uL (ref 0.0–0.5)
Eosinophils Relative: 1 %
HCT: 40.7 % (ref 39.0–52.0)
Hemoglobin: 13.7 g/dL (ref 13.0–17.0)
Immature Granulocytes: 0 %
Lymphocytes Relative: 6 %
Lymphs Abs: 0.3 10*3/uL — ABNORMAL LOW (ref 0.7–4.0)
MCH: 32.3 pg (ref 26.0–34.0)
MCHC: 33.7 g/dL (ref 30.0–36.0)
MCV: 96 fL (ref 80.0–100.0)
Monocytes Absolute: 0.5 10*3/uL (ref 0.1–1.0)
Monocytes Relative: 11 %
Neutro Abs: 3.6 10*3/uL (ref 1.7–7.7)
Neutrophils Relative %: 82 %
Platelets: 231 10*3/uL (ref 150–400)
RBC: 4.24 MIL/uL (ref 4.22–5.81)
RDW: 15.4 % (ref 11.5–15.5)
WBC: 4.4 10*3/uL (ref 4.0–10.5)
nRBC: 0 % (ref 0.0–0.2)

## 2021-09-03 LAB — TROPONIN I (HIGH SENSITIVITY)
Troponin I (High Sensitivity): 34 ng/L — ABNORMAL HIGH (ref <18)
Troponin I (High Sensitivity): 35 ng/L — ABNORMAL HIGH (ref <18)

## 2021-09-03 LAB — RAPID URINE DRUG SCREEN, HOSP PERFORMED
Amphetamines: NOT DETECTED
Barbiturates: NOT DETECTED
Benzodiazepines: NOT DETECTED
Cocaine: NOT DETECTED
Opiates: NOT DETECTED
Tetrahydrocannabinol: NOT DETECTED

## 2021-09-03 LAB — RESP PANEL BY RT-PCR (FLU A&B, COVID) ARPGX2
Influenza A by PCR: NEGATIVE
Influenza B by PCR: NEGATIVE
SARS Coronavirus 2 by RT PCR: NEGATIVE

## 2021-09-03 LAB — BRAIN NATRIURETIC PEPTIDE: B Natriuretic Peptide: 2289 pg/mL — ABNORMAL HIGH (ref 0.0–100.0)

## 2021-09-03 LAB — URINALYSIS, ROUTINE W REFLEX MICROSCOPIC
Bacteria, UA: NONE SEEN
Bilirubin Urine: NEGATIVE
Glucose, UA: NEGATIVE mg/dL
Ketones, ur: NEGATIVE mg/dL
Leukocytes,Ua: NEGATIVE
Nitrite: NEGATIVE
Protein, ur: NEGATIVE mg/dL
Specific Gravity, Urine: 1.004 — ABNORMAL LOW (ref 1.005–1.030)
pH: 6 (ref 5.0–8.0)

## 2021-09-03 MED ORDER — ONDANSETRON HCL 4 MG PO TABS: 4.0000 mg | ORAL_TABLET | Freq: Four times a day (QID) | ORAL | Status: DC | PRN

## 2021-09-03 MED ORDER — ENOXAPARIN SODIUM 40 MG/0.4ML IJ SOSY
40.0000 mg | PREFILLED_SYRINGE | INTRAMUSCULAR | Status: DC
  Administered 2021-09-03 – 2021-09-04 (×2): 40 mg via SUBCUTANEOUS
  Filled 2021-09-03 (×2): qty 0.4

## 2021-09-03 MED ORDER — ACETAMINOPHEN 650 MG RE SUPP
650.0000 mg | Freq: Four times a day (QID) | RECTAL | Status: DC | PRN
Start: 2021-09-03 — End: 2021-09-05

## 2021-09-03 MED ORDER — POTASSIUM CHLORIDE CRYS ER 20 MEQ PO TBCR
20.0000 meq | EXTENDED_RELEASE_TABLET | Freq: Every day | ORAL | Status: DC
  Filled 2021-09-03: qty 1

## 2021-09-03 MED ORDER — SACUBITRIL-VALSARTAN 49-51 MG PO TABS
1.0000 | ORAL_TABLET | Freq: Two times a day (BID) | ORAL | Status: DC
  Administered 2021-09-03: 1 via ORAL
  Filled 2021-09-03 (×2): qty 1

## 2021-09-03 MED ORDER — POTASSIUM CHLORIDE CRYS ER 20 MEQ PO TBCR: 20.0000 meq | EXTENDED_RELEASE_TABLET | Freq: Every day | ORAL | Status: DC

## 2021-09-03 MED ORDER — DOCUSATE SODIUM 100 MG PO CAPS
100.0000 mg | ORAL_CAPSULE | Freq: Two times a day (BID) | ORAL | Status: DC
  Administered 2021-09-03 – 2021-09-05 (×3): 100 mg via ORAL
  Filled 2021-09-03 (×4): qty 1

## 2021-09-03 MED ORDER — OXYCODONE HCL 5 MG PO TABS: 5.0000 mg | ORAL_TABLET | Freq: Three times a day (TID) | ORAL | Status: DC | PRN

## 2021-09-03 MED ORDER — FUROSEMIDE 10 MG/ML IJ SOLN
60.0000 mg | INTRAMUSCULAR | Status: AC
  Administered 2021-09-03: 60 mg via INTRAVENOUS
  Filled 2021-09-03: qty 6

## 2021-09-03 MED ORDER — ACETAMINOPHEN 325 MG PO TABS
650.0000 mg | ORAL_TABLET | Freq: Four times a day (QID) | ORAL | Status: DC | PRN
  Administered 2021-09-04: 650 mg via ORAL
  Filled 2021-09-03: qty 2

## 2021-09-03 MED ORDER — ONDANSETRON HCL 4 MG/2ML IJ SOLN: 4.0000 mg | Freq: Four times a day (QID) | INTRAMUSCULAR | Status: DC | PRN

## 2021-09-03 MED ORDER — POTASSIUM CHLORIDE CRYS ER 20 MEQ PO TBCR
40.0000 meq | EXTENDED_RELEASE_TABLET | Freq: Two times a day (BID) | ORAL | Status: AC
  Administered 2021-09-03 – 2021-09-04 (×2): 40 meq via ORAL
  Filled 2021-09-03 (×2): qty 2

## 2021-09-03 MED ORDER — SPIRONOLACTONE 25 MG PO TABS
25.0000 mg | ORAL_TABLET | Freq: Every day | ORAL | Status: DC
  Filled 2021-09-03: qty 1

## 2021-09-03 MED ORDER — FUROSEMIDE 10 MG/ML IJ SOLN
60.0000 mg | Freq: Two times a day (BID) | INTRAMUSCULAR | Status: DC
  Administered 2021-09-04 – 2021-09-05 (×3): 60 mg via INTRAVENOUS
  Filled 2021-09-03 (×3): qty 6

## 2021-09-03 MED ORDER — POTASSIUM CHLORIDE CRYS ER 20 MEQ PO TBCR
40.0000 meq | EXTENDED_RELEASE_TABLET | Freq: Once | ORAL | Status: AC
  Administered 2021-09-03: 40 meq via ORAL
  Filled 2021-09-03: qty 2

## 2021-09-03 MED ORDER — HYDRALAZINE HCL 20 MG/ML IJ SOLN: 10.0000 mg | Freq: Four times a day (QID) | INTRAMUSCULAR | Status: DC | PRN

## 2021-09-03 MED ORDER — FUROSEMIDE 10 MG/ML IJ SOLN
60.0000 mg | Freq: Once | INTRAMUSCULAR | Status: AC
  Administered 2021-09-03: 60 mg via INTRAVENOUS
  Filled 2021-09-03: qty 6

## 2021-09-03 MED ORDER — BENZONATATE 100 MG PO CAPS
100.0000 mg | ORAL_CAPSULE | Freq: Three times a day (TID) | ORAL | Status: DC | PRN
  Administered 2021-09-03 – 2021-09-05 (×4): 100 mg via ORAL
  Filled 2021-09-03 (×4): qty 1

## 2021-09-03 MED ORDER — CARVEDILOL 3.125 MG PO TABS
3.1250 mg | ORAL_TABLET | Freq: Two times a day (BID) | ORAL | Status: DC
  Administered 2021-09-04 – 2021-09-05 (×2): 3.125 mg via ORAL
  Filled 2021-09-03 (×3): qty 1

## 2021-09-03 MED ORDER — ASPIRIN EC 81 MG PO TBEC
81.0000 mg | DELAYED_RELEASE_TABLET | Freq: Every day | ORAL | Status: DC
  Administered 2021-09-04 – 2021-09-05 (×2): 81 mg via ORAL
  Filled 2021-09-03 (×2): qty 1

## 2021-09-03 MED ORDER — POLYETHYLENE GLYCOL 3350 17 G PO PACK: 17.0000 g | PACK | Freq: Every day | ORAL | Status: DC | PRN

## 2021-09-03 MED ORDER — FLUTICASONE PROPIONATE 50 MCG/ACT NA SUSP
2.0000 | Freq: Every day | NASAL | Status: DC
  Administered 2021-09-04 – 2021-09-05 (×2): 2 via NASAL
  Filled 2021-09-03: qty 16

## 2021-09-03 MED ORDER — GUAIFENESIN-DM 100-10 MG/5ML PO SYRP
5.0000 mL | ORAL_SOLUTION | ORAL | Status: DC | PRN
  Administered 2021-09-03 – 2021-09-05 (×2): 5 mL via ORAL
  Filled 2021-09-03 (×3): qty 5

## 2021-09-03 NOTE — Assessment & Plan Note (Addendum)
-   Patient's potassium was 3.8 this a.m. ?-Replete with p.o. potassium chloride 40 mg twice daily x2 doses yesterday ?-Continue to monitor and replete as necessary ?-Repeat CMP in the a.m. ?

## 2021-09-03 NOTE — Assessment & Plan Note (Addendum)
-  Has an EF of less than 20% and grade 3 diastolic dysfunction and on repeat echo it is about the same ?-Initiated on IV diuretic 60 mg twice daily and will continue as blood pressure allows ?-Cardiology been consulted for further evaluation and we will repeat a chest x-ray and echocardiogram ?-Resumed his home medications Coreg 3.25 mg p.o. twice daily, aspirin 81 mg p.o. daily, Entresto 49-51; as well as spironolactone 25 mg p.o. daily ?-Unfortunately because of his hypotension is Entresto and spironolactone were held ?-Reportedly no longer taking his [name] ?-Holding his metolazone and torsemide for now ?-Further evaluation recommendation by cardiology ?-Troponins were mildly elevated likely secondary to demand ischemia ?-Patient is being worked up for a possible heart transplant and prior to admission has been on Farxiga, torsemide, Coreg and Entresto ?-Continues to have congestion on lung examination ?-Does not desire to be transferred to [facility] and wants to follow-up with Dr. Haroldine Laws as an outpatient ?

## 2021-09-03 NOTE — Assessment & Plan Note (Signed)
-   Does not appear to be taking statin anymore and will defer to cardiology for further evaluation and consideration for repeat lipid panel while hospitalized ?

## 2021-09-03 NOTE — ED Provider Notes (Signed)
?[name] ?Provider Note ? ? ?CSN: [id number] ?Arrival date & time: 09/03/21  1025 ? ?  ? ?History ? ?Chief Complaint  ?Patient presents with  ? Cough  ? ? ?BALEY [name] is a 51 y.o. male. ? ?HPI ? ?Patient with medical history including liver cirrhosis, heart failure EF of less than 25%, cardiomyopathy AICD presents with complaints of cough.  He states this started on Wednesday states he had a productive cough clear/yellowish in nature, states he started to feel better yesterday but last night he  felt as if he is getting worse he states that he was unable to sleep on his back as he was coughing so much and had to sleep in a recliner.  He states he was having worsening leg swelling but this has improved after he increase his fluid pills.  He denies any pleuritic chest pain, shortness of breath, chest pain, he has not weighed himself unclear if he has had any increased weight gain.  He has no fevers chills congestion stomach pains nausea vomiting general body aches.  He denies recent sick contacts, patient is concerned he might have pneumonia. ? ?I reviewed patient's chart followed by Dr. Haroldine Laws cardiology has severe chronic systolic heart failure with severe tricuspid valve regurg liver cirrhosis. ? ?Home Medications ?Prior to Admission medications   ?Medication Sig Start Date End Date Taking? Authorizing Provider  ?aspirin 81 MG tablet Take 81 mg by mouth daily.   Yes [provider]  ?carvedilol (COREG) 3.125 MG tablet TAKE 1 TABLET BY MOUTH TWICE DAILY WITH A MEAL 05/26/21  Yes [name], Shaune Pascal, MD  ?ENTRESTO 49-51 MG TAKE 1 TABLET BY MOUTH TWICE DAILY . APPOINTMENT REQUIRED FOR FUTURE REFILLS ?Patient taking differently: Take 1 tablet by mouth 2 (two) times daily. 01/24/21  Yes [name], Shaune Pascal, MD  ?fluticasone (FLONASE) 50 MCG/ACT nasal spray Place 2 sprays into both nostrils daily. 05/30/21  Yes Susy Frizzle, MD  ?oxyCODONE (OXY IR/ROXICODONE) 5 MG immediate  release tablet Take 1 tablet (5 mg total) by mouth 3 (three) times daily as needed for severe pain. 08/29/21  Yes Susy Frizzle, MD  ?potassium chloride SA (KLOR-CON) 20 MEQ tablet Take 1 tablet (20 mEq total) by mouth daily. Take an additional 70mq with Metolazone. 09/22/19  Yes [name], DShaune Pascal MD  ?spironolactone (ALDACTONE) 25 MG tablet Take 1 tablet (25 mg total) by mouth daily. 11/08/20  Yes PSusy Frizzle MD  ?torsemide (DEMADEX) 20 MG tablet Take 2 tablets (40 mg total) by mouth daily. Take extra tab as needed for swelling 06/08/21  Yes [name], DShaune Pascal MD  ?Blood Glucose Monitoring Suppl (BLOOD GLUCOSE SYSTEM PAK) KIT Please dispense based on patient and insurance preference. Use to monitor FSBS 2-3x weekly. Dx: E11.9. 03/07/17   DAlycia Rossetti MD  ?dapagliflozin propanediol (FARXIGA) 5 MG TABS tablet Take 1 tablet (5 mg total) by mouth daily before breakfast. ?Patient not taking: Reported on 09/03/2021 08/01/21   [name], DShaune Pascal MD  ?Glucose Blood (BLOOD GLUCOSE TEST STRIPS) STRP Please dispense based on patient and insurance preference. Use to monitor FSBS 2-3x weekly. Dx: E11.9. 03/07/17   DAlycia Rossetti MD  ?Lancet Devices MISC Please dispense based on patient and insurance preference. Use to monitor FSBS 2-3x weekly. Dx: E11.9. 03/07/17   DAlycia Rossetti MD  ?metolazone (ZAROXOLYN) 2.5 MG tablet TAKE 1 TABLET BY MOUTH AS DIRECTED BY  HEART  FAILURE  CLINIC ?Patient not taking: Reported on 09/03/2021  06/22/19   [name], Shaune Pascal, MD  ?   ? ?Allergies    ?Ibuprofen and Nsaids   ? ?Review of Systems   ?Review of Systems  ?Constitutional:  Negative for chills and fever.  ?Respiratory:  Positive for cough. Negative for shortness of breath.   ?Cardiovascular:  Positive for leg swelling. Negative for chest pain.  ?Gastrointestinal:  Negative for abdominal pain.  ?Neurological:  Negative for headaches.  ? ?Physical Exam ?Updated Vital Signs ?BP 100/68 (BP Location: Left Arm)   Pulse  (!) 107   Temp 98.6 ?F (37 ?C) (Oral)   Resp 19   Ht 5' 9"  (1.753 m)   Wt 68.9 kg   SpO2 94%   BMI 22.45 kg/m?  ?Physical Exam ?Vitals and nursing note reviewed.  ?Constitutional:   ?   General: He is not in acute distress. ?   Appearance: He is not ill-appearing.  ?HENT:  ?   Head: Normocephalic and atraumatic.  ?   Nose: No congestion.  ?Eyes:  ?   Conjunctiva/sclera: Conjunctivae normal.  ?Neck:  ?   Comments: No notable JVD. ?Cardiovascular:  ?   Rate and Rhythm: Normal rate and regular rhythm.  ?   Pulses: Normal pulses.  ?   Heart sounds: No murmur heard. ?  No friction rub. No gallop.  ?Pulmonary:  ?   Effort: No respiratory distress.  ?   Breath sounds: Wheezing and rales present. No rhonchi.  ?   Comments: No evidence of respiratory distress able speak in full sentences nontachypneic nonhypoxic, had noted decreased lung sounds left lower lobe with rales heard bilaterally, intermittent wheezing heard in the upper lobes no rhonchi or stridor present. ?Abdominal:  ?   Palpations: Abdomen is soft.  ?   Tenderness: There is no abdominal tenderness. There is no right CVA tenderness or left CVA tenderness.  ?   Comments: Abdomen slightly distended but normoactive bowel sounds, dull to percussion, negative fluid wave, nontender on my exam no guarding, rebound times, peritoneal sign.  ?Musculoskeletal:  ?   Right lower leg: Edema present.  ?   Left lower leg: Edema present.  ?   Comments: Patient has noted 2+ pitting edema bilaterally up to the mid shins, no chronic skin changes, no unilateral leg swelling 2+ dorsal pedal pulses bilaterally.  ?Skin: ?   General: Skin is warm and dry.  ?Neurological:  ?   Mental Status: He is alert.  ?Psychiatric:     ?   Mood and Affect: Mood normal.  ? ? ?ED Results / Procedures / Treatments   ?Labs ?(all labs ordered are listed, but only abnormal results are displayed) ?Labs Reviewed  ?BRAIN NATRIURETIC PEPTIDE - Abnormal; Notable for the following components:  ?    Result  Value  ? B Natriuretic Peptide 2,289.0 (*)   ? All other components within normal limits  ?COMPREHENSIVE METABOLIC PANEL - Abnormal; Notable for the following components:  ? Sodium 129 (*)   ? Potassium 3.4 (*)   ? Chloride 89 (*)   ? Calcium 8.1 (*)   ? Albumin 3.1 (*)   ? Alkaline Phosphatase 255 (*)   ? Total Bilirubin 1.3 (*)   ? All other components within normal limits  ?CBC WITH DIFFERENTIAL/PLATELET - Abnormal; Notable for the following components:  ? Lymphs Abs 0.3 (*)   ? All other components within normal limits  ?URINALYSIS, ROUTINE W REFLEX MICROSCOPIC - Abnormal; Notable for the following components:  ? Specific Gravity,  Urine 1.004 (*)   ? Hgb urine dipstick SMALL (*)   ? All other components within normal limits  ?TROPONIN I (HIGH SENSITIVITY) - Abnormal; Notable for the following components:  ? Troponin I (High Sensitivity) 34 (*)   ? All other components within normal limits  ?TROPONIN I (HIGH SENSITIVITY) - Abnormal; Notable for the following components:  ? Troponin I (High Sensitivity) 35 (*)   ? All other components within normal limits  ?RESP PANEL BY RT-PCR (FLU A&B, COVID) [id number]  ?RAPID URINE DRUG SCREEN, HOSP PERFORMED  ? ? ?EKG ?None ? ?Radiology ?DG Chest 2 View ? ?Result Date: 09/03/2021 ?CLINICAL DATA:  51 year old male with history of cough and shortness of breath. EXAM: CHEST - 2 VIEW COMPARISON:  Chest x-ray 06/08/2017. FINDINGS: There is cephalization of the pulmonary vasculature and slight indistinctness of the interstitial markings suggestive of mild pulmonary edema. Small right pleural effusion. No definite left pleural effusion. No pneumothorax. Mild cardiomegaly. Upper mediastinal contours are within normal limits. Left sided pacemaker/AICD with lead tip projecting over the expected location of the right ventricular apex. IMPRESSION: 1. The appearance the chest suggests mild congestive heart failure, as above. Electronically Signed   By: Vinnie Langton M.D.   On: 09/03/2021  11:00   ? ?Procedures ?Procedures  ? ? ?Medications Ordered in ED ?Medications  ?potassium chloride SA (KLOR-CON M) CR tablet 40 mEq (has no administration in time range)  ?furosemide (LASIX) injection 60 mg (60 mg

## 2021-09-03 NOTE — ED Notes (Signed)
Pt provided warm blanket.

## 2021-09-03 NOTE — Assessment & Plan Note (Addendum)
-   Has been having a worsening cough so we will admit as an inpatient given that he has an elevated BNP of 2289.0 and clinical signs of volume overload especially on his chest x-ray and lower extremity swelling;  ?-Repeat BNP was >4,500.0 and contiuing IV Lasix  ?-Patient had orthopnea ?-Given IV Lasix 60 mg once in the ED and will resume 60 mg IV twice daily and hold his home torsemide ?-Cardiology was consulted for further evaluation and will repeat an echocardiogram which is essentially unchanged ?-Patient is -958 mL ?-Hold his GDMT with Spironolactone, and Entresto given his Hypotension ?-C/w Carvedilol 3.125 mg po BID ?-We will need strict I's and O's and daily weights ?-Continue monitor for signs symptoms of volume overload and repeat chest x-ray in the morning ?-Troponin slightly elevated at 34 and trended up to 35 and likely in the setting of demand ischemia from his volume overload ?-Repeat CXR this AM showed "Mild cardiomegaly. Unchanged small right pleural effusion and/or pleural thickening. Unchanged mild, diffuse interstitial pulmonary opacity most consistent with edema. No new or focal airspace opacity." ?? ?

## 2021-09-03 NOTE — Assessment & Plan Note (Addendum)
-   In the history of alcoholism. ?-Currently getting diuresed with IV Lasix now ?-Check LFTs in the morning ?-His T. bili was slightly elevated at 1.3 -> 1.4 will need to continue monitor and trend  ?

## 2021-09-03 NOTE — Assessment & Plan Note (Addendum)
Acute Respiratory Failure with Hypoxia ?-He could have a viral illness or this could be secondary to his CHF ?-Patient was slightly febrile at 100.4 and his WBC is trended down to 3.5; empirically will treat him for a CAP  This is less likely ?-Initiate benzonatate ?-Check respiratory virus panel and pending  ?-His COVID-19 and influenza A and influenza B were negative repeat chest x-ray in a.m. and continue monitor and trend carefully ?-Continue with supportive care ?-If necessary will provide breathing treatments ?-He became hypoxic yesterday and is now on 2 L of supplemental oxygen ?-SpO2: 97 % ?O2 Flow Rate (L/min): 2 L/min ?FiO2 (%): 21 % ?-Continuous Pulse Oximetry and Maintain O2 Sats >90% ?-Continue supplemental O2 via [place] and wean O2 as Tolerated ?-Needs Repeat CXR and Ambulatory Home O2 Screen Prior to D/C  ?

## 2021-09-03 NOTE — ED Triage Notes (Signed)
Reports cough since last Wednesday but worse last night nad had to sleep in recliner.  Patient has hx of CHF.  Bilateral leg swelling and some abd swelling.  Patient also reports pain in back of head 8/10.  Reports sputum is clear. ?

## 2021-09-03 NOTE — Assessment & Plan Note (Addendum)
-  Patient's sodium is now 129 -> 132 ?-Likely in the setting of volume overload ?-Expect to improve with diuresis ?-Continue to monitor and trend and repeat CMP in a.m. ?

## 2021-09-03 NOTE — H&P (Addendum)
History and Physical    Patient: [name]:[id number] DOB: 26-Jan-1971 DOA: 09/03/2021 DOS: the patient was seen and examined on 09/03/2021 PCP: Donita Brooks, MD  Patient coming from: Home  Chief Complaint:  Chief Complaint  Patient presents with   Cough   HPI: [name] is a 51 y.o. 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  He was having increased worsening swelling of his extremities so he called his cardiologist and he was told to increase his evening dose of torsemide to 40 mg twice daily.  Normally he takes 40 mg of torsemide in the morning and 20 mg in the evening.  He denies any pleuritic chest pain, shortness of breath or chest pain but has not weighed himself and is unclear if he has had any weight gains.  He denies any sick contacts but he is concerned that he may have pneumonia given his cough.  He called his cardiologist Dr. Gala Romney but unfortunately [name] is on vacation in [place].  Chest x-ray done on admission and showed pulmonary vasculature congestion and  markings suggestive of mild pulmonary edema.  Patient denies any nausea, vomiting, diarrhea, changes in bowel and thinks his leg swelling is a little bit better with the increased dose of Lasix but states that his cough and congestion is bad.  [facility] has been asked to admit this patient for his cough and acute on chronic systolic and diastolic CHF.    Review of Systems: As mentioned in the history of present illness. All other systems reviewed and are negative. Past Medical History:  Diagnosis Date   AICD (automatic cardioverter/defibrillator) present    Arthritis    Cardiomyopathy, nonischemic (HCC)    takes Digoxin daily and Carvedilol daily   CHF (congestive heart failure) (HCC)    takes Lasix daily as well Aldactone   Chronic back pain    Chronic systolic heart failure (HCC)    Cirrhosis (HCC)    Depression    takes Prozac daily   GERD (gastroesophageal reflux disease)    takes Omeprazole daily   Hx of Alcohol consumption heavy    rare beer currently   hx of Tobacco abuse    quit smoking 2014   Hyperlipidemia    was on Simvastatin but has been off a year   Insomnia    takes Ambien as needed(just got script yesterday)   Orthostatic hypotension    Scoliosis    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 and chemo)   Past Surgical History:  Procedure Laterality Date   CARPAL TUNNEL RELEASE  Right 01/09/2013   Procedure: CARPAL TUNNEL RELEASE;  Surgeon: Carmela Hurt, MD;  Location: [facility];  Service: Neurosurgery;  Laterality: Right;  RIGHT carpal tunnel release   CARPAL TUNNEL RELEASE Left 01/30/2013   Procedure: LEFT CARPAL TUNNEL RELEASE;  Surgeon: Carmela Hurt, MD;  Location: [facility];  Service: Neurosurgery;  Laterality: Left;  LEFT Carpal Tunnel release   CHEST TUBE INSERTION Right 09/09/2013   Procedure: INSERTION PLEURAL DRAINAGE CATHETER;  Surgeon: Kerin Perna, MD;  Location: [facility] OR;  Service: Thoracic;  Laterality: Right;   HYDROCELE EXCISION Right 04/11/2017    Procedure: RIGHT HYDROCELECTOMY ADULT;  Surgeon: Bjorn Pippin, MD;  Location: [facility] ORS;  Service: Urology;  Laterality: Right;   hydrocelectomy  2008   ICD  05/25/2011   [facility] Scientific [employer] SG lead/[employer] single chamber device   IMPLANTABLE CARDIOVERTER DEFIBRILLATOR IMPLANT N/A 05/25/2011   Procedure: IMPLANTABLE CARDIOVERTER DEFIBRILLATOR IMPLANT;  Surgeon: Marinus Maw, MD;  Location: [facility] CATH LAB;  Service: Cardiovascular;  Laterality: N/A;   NEPHRECTOMY  36 yrs ago   Wilms Tumor   RIGHT HEART CATH N/A 01/29/2019   Procedure: RIGHT HEART CATH;  Surgeon: Dolores Patty, MD;  Location: [facility];  Service: Cardiovascular;  Laterality: N/A;   RIGHT HEART CATH N/A 09/08/2019   Procedure: RIGHT HEART CATH;  Surgeon: Dolores Patty, MD;  Location: [facility];  Service: Cardiovascular;  Laterality: N/A;   RIGHT/LEFT HEART CATH AND CORONARY ANGIOGRAPHY N/A 02/12/2017   Procedure: RIGHT/LEFT HEART CATH AND CORONARY ANGIOGRAPHY;  Surgeon: Dolores Patty, MD;  Location: [facility] CV [facility];  Service: Cardiovascular;  Laterality: N/A;   ULNAR NERVE TRANSPOSITION Right 01/09/2013   Procedure: ULNAR NERVE DECOMPRESSION/TRANSPOSITION;  Surgeon: Carmela Hurt, MD;  Location: [facility];  Service: Neurosurgery;  Laterality: Right;  RIGHT ulnar nerve decompression   Social History:  reports that he quit smoking about 8 years ago. His smoking use included cigarettes. He has a 5.00 pack-year smoking history. His smokeless tobacco use includes snuff. He reports current alcohol use. He reports that he does not use drugs.  Allergies  Allergen Reactions   Ibuprofen Hives   Nsaids     ELEVATED LFT'S   Family History  Problem Relation Age of Onset   Diabetes Mother    Prior to Admission medications   Medication Sig Start Date End Date Taking? Authorizing Provider  aspirin 81 MG tablet Take 81 mg by mouth daily.   Yes [provider]  carvedilol (COREG)  3.125 MG tablet TAKE 1 TABLET BY MOUTH TWICE DAILY WITH A MEAL 05/26/21  Yes [name], Bevelyn Buckles, MD  ENTRESTO 49-51 MG TAKE 1 TABLET BY MOUTH TWICE DAILY . APPOINTMENT REQUIRED FOR FUTURE REFILLS Patient taking differently: Take 1 tablet by mouth 2 (two) times daily. 01/24/21  Yes [name], Bevelyn Buckles, MD  fluticasone (FLONASE) 50 MCG/ACT nasal spray Place 2 sprays into both nostrils daily. 05/30/21  Yes Donita Brooks, MD  oxyCODONE (OXY IR/ROXICODONE) 5 MG immediate release tablet Take 1 tablet (5 mg total) by mouth 3 (three) times daily as needed for severe pain. 08/29/21  Yes Donita Brooks, MD  potassium chloride SA (KLOR-CON) 20 MEQ tablet Take 1 tablet (20 mEq total) by mouth daily. Take an additional with Metolazone. 09/22/19  Yes [name], Bevelyn Buckles, MD  spironolactone (ALDACTONE) 25 MG tablet Take 1 tablet (25 mg total) by mouth daily. 11/08/20  Yes Donita Brooks, MD  torsemide (DEMADEX) 20 MG tablet Take 2 tablets (40 mg total) by mouth daily. Take extra tab as needed for swelling 06/08/21  Yes [name], Bevelyn Buckles, MD  Blood Glucose Monitoring Suppl (BLOOD GLUCOSE SYSTEM PAK) KIT Please dispense based on patient and insurance preference. Use to monitor FSBS 2-3x weekly. Dx: E11.9. 03/07/17   Salley Scarlet, MD  dapagliflozin propanediol (FARXIGA) 5 MG TABS tablet Take 1 tablet (5 mg total) by mouth daily before breakfast. Patient not taking: Reported on 09/03/2021 08/01/21   [name], Bevelyn Buckles, MD  Glucose Blood (BLOOD GLUCOSE TEST STRIPS) STRP Please dispense based on patient and insurance preference. Use to monitor FSBS 2-3x weekly. Dx: E11.9. 03/07/17   Salley Scarlet, MD  Lancet Devices MISC Please dispense based on patient and insurance preference. Use to monitor FSBS 2-3x weekly. Dx: E11.9. 03/07/17   Salley Scarlet, MD  metolazone (ZAROXOLYN) 2.5 MG tablet TAKE 1 TABLET BY MOUTH AS DIRECTED BY  HEART  FAILURE  CLINIC Patient not taking: Reported on 09/03/2021 06/22/19    [name], Bevelyn Buckles, MD   Physical Exam: Vitals:   09/03/21 1025 09/03/21 1230 09/03/21 1235 09/03/21 1730  BP:  100/68 100/68 103/64  Pulse: (!) 102  (!) 107 (!) 105  Resp: (!) 30 (!) 26 19 19   Temp:      TempSrc:      SpO2: 97% 98% 94% 91%  Weight:      Height:       Examination: Physical Exam:  Constitutional: WN/WD chronically ill-appearing Caucasian male currently no acute distress but is coughing quite a bit Respiratory: Diminished to auscultation bilaterally with some slight crackles and mild wheezing, no wheezing, rales, rhonchi or crackles. Normal respiratory effort and patient is not tachypenic. No accessory muscle use.  Unlabored breathing not wearing supplemental oxygen nasal cannula Cardiovascular: Tachycardic rate but regular rhythm, has a 3 out of 6 murmur and an AICD noted under his skin on his chest.  Has 1+ extremity edema Abdomen: Soft, non-tender, non-distended. Bowel sounds positive.  GU: Deferred. Musculoskeletal: No clubbing / cyanosis of digits/nails. No joint deformity upper and lower extremities. Skin: No rashes, lesions, ulcers on limited skin evaluation. No induration; Warm and dry.  Neurologic: CN 2-12 grossly intact with no focal deficits. Psychiatric: Normal judgment and insight. Alert and oriented x 3. Normal mood and appropriate affect.   Data Reviewed:  I have independently reviewed and assessed the patient's clinical laboratory data and his sodium is 129, potassium is 3.4, T. bili is 1.3, BNP is 2289.0, and troponins are minimally elevated at 34 and 35 respectively.  Chest x-ray has been reviewed and it appears that he is volume overloaded.  Urinalysis is unremarkable except some small hemoglobin in his urine dipstick likely microscopic, and a negative UDS.  KG was personally reviewed and showed a sinus tachycardia with a rate of 102 and a QTc of 476.  Assessment and Plan: * Acute on chronic combined systolic and diastolic CHF (congestive heart  failure) (HCC) - Has been having a worsening cough so we will admit as an inpatient given that he has an elevated BNP of 2289.0 and clinical signs of volume overload especially on his chest x-ray and lower extremity swelling -Patient had orthopnea -Given IV Lasix 60 mg once in the ED and will resume 60 mg IV twice daily and hold his home torsemide -Cardiology was consulted for further evaluation and will repeat an echocardiogram -We will need strict I's and O's and daily weights -  Continue monitor for signs symptoms of volume overload and repeat chest x-ray in the morning -Troponin slightly elevated at 34 and trended up to 35 and likely in the setting of demand ischemia from his volume overload   Hypokalemia - Patient's potassium was 3.4 this morning -With p.o. potassium chloride 40 mg twice daily x2 doses  Hyponatremia - Patient's sodium is now 129 -Likely in the setting of volume overload -Expect to improve with diuresis -Continue to monitor and trend and repeat CMP in a.m.  Cough - He could have a viral illness or this could be secondary to his CHF -Initiate benzonatate -Check respiratory virus panel  -His COVID-19 and influenza A and influenza B were negative repeat chest x-ray in a.m. and continue monitor and trend carefully -Continue with supportive care -If necessary will provide breathing treatments but he is not hypoxic  GERD (gastroesophageal reflux disease) - Does not appear to be on a PPI anymore but if necessary will provide him with PPI  Cirrhosis of liver (HCC) - In the history of alcoholism. -Currently getting diuresed with IV Lasix now -Check LFTs in the morning -His T. bili was slightly elevated at 1.3 will need to continue monitor and trend  Hyperlipidemia - Does not appear to be taking statin anymore and will defer to cardiology for further evaluation and consideration for repeat lipid panel while hospitalized  Depression - Has not active SI or HI and does  not appear to be taking any medications by his MAR  Nonischemic dilated cardiomyopathy (HCC) - Has an EF of less than 20% and grade 3 diastolic dysfunction -Initiated on IV diuretic -Cardiology been consulted for further evaluation and we will repeat a chest x-ray and echocardiogram -Resume his home medications Coreg 3.25 mg p.o. twice daily, aspirin 81 mg p.o. daily, Entresto 49-51; as well as spironolactone 25 mg p.o. daily -Reportedly no longer taking his [place] -Holding his metolazone and torsemide for now -Further evaluation recommendation by cardiology -Troponins were mildly elevated likely secondary to demand ischemia   Advance Care Planning:   Code Status: Prior FULL CODE   Consults: Cardiology   Family Communication: No family present at bedside   Severity of Illness: The appropriate patient status for this patient is INPATIENT. Inpatient status is judged to be reasonable and necessary in order to provide the required intensity of service to ensure the patient's safety. The patient's presenting symptoms, physical exam findings, and initial radiographic and laboratory data in the context of their chronic comorbidities is felt to place them at high risk for further clinical deterioration. Furthermore, it is not anticipated that the patient will be medically stable for discharge from the hospital within 2 midnights of admission.   * I certify that at the point of admission it is my clinical judgment that the patient will require inpatient hospital care spanning beyond 2 midnights from the point of admission due to high intensity of service, high risk for further deterioration and high frequency of surveillance required.*  Author: Marguerita Merles, DO [facility]  09/03/2021 7:04 PM  For on call review [email or web address].

## 2021-09-03 NOTE — Assessment & Plan Note (Signed)
-   Has not active SI or HI and does not appear to be taking any medications by his MAR ?

## 2021-09-03 NOTE — Assessment & Plan Note (Signed)
-   Does not appear to be on a PPI anymore but if necessary will provide him with PPI ?

## 2021-09-04 ENCOUNTER — Inpatient Hospital Stay (HOSPITAL_COMMUNITY): Payer: Medicare HMO

## 2021-09-04 DIAGNOSIS — I959 Hypotension, unspecified: Secondary | ICD-10-CM

## 2021-09-04 DIAGNOSIS — K7030 Alcoholic cirrhosis of liver without ascites: Secondary | ICD-10-CM | POA: Diagnosis not present

## 2021-09-04 DIAGNOSIS — R059 Cough, unspecified: Secondary | ICD-10-CM

## 2021-09-04 DIAGNOSIS — K2100 Gastro-esophageal reflux disease with esophagitis, without bleeding: Secondary | ICD-10-CM | POA: Diagnosis not present

## 2021-09-04 DIAGNOSIS — E785 Hyperlipidemia, unspecified: Secondary | ICD-10-CM

## 2021-09-04 DIAGNOSIS — I5043 Acute on chronic combined systolic (congestive) and diastolic (congestive) heart failure: Secondary | ICD-10-CM

## 2021-09-04 LAB — COMPREHENSIVE METABOLIC PANEL
ALT: 17 U/L (ref 0–44)
AST: 33 U/L (ref 15–41)
Albumin: 2.9 g/dL — ABNORMAL LOW (ref 3.5–5.0)
Alkaline Phosphatase: 228 U/L — ABNORMAL HIGH (ref 38–126)
Anion gap: 12 (ref 5–15)
BUN: 19 mg/dL (ref 6–20)
CO2: 25 mmol/L (ref 22–32)
Calcium: 8 mg/dL — ABNORMAL LOW (ref 8.9–10.3)
Chloride: 95 mmol/L — ABNORMAL LOW (ref 98–111)
Creatinine, Ser: 1.09 mg/dL (ref 0.61–1.24)
GFR, Estimated: >60 mL/min (ref >60)
Glucose, Bld: 108 mg/dL — ABNORMAL HIGH (ref 70–99)
Potassium: 3.8 mmol/L (ref 3.5–5.1)
Sodium: 132 mmol/L — ABNORMAL LOW (ref 135–145)
Total Bilirubin: 1.4 mg/dL — ABNORMAL HIGH (ref 0.3–1.2)
Total Protein: 6.1 g/dL — ABNORMAL LOW (ref 6.5–8.1)

## 2021-09-04 LAB — ECHOCARDIOGRAM COMPLETE
AR max vel: 3.7 cm2
AV Area VTI: 3.32 cm2
AV Area mean vel: 3.1 cm2
AV Mean grad: 2 mmHg
AV Peak grad: 2.6 mmHg
Ao pk vel: 0.81 m/s
Area-P 1/2: 7.37 cm2
Calc EF: 22.1 %
Height: 67 in
MV VTI: 3.34 cm2
S' Lateral: 5.2 cm
Single Plane A2C EF: 20.6 %
Single Plane A4C EF: 19.2 %
Weight: 2412.71 oz

## 2021-09-04 LAB — CBC
HCT: 38.5 % — ABNORMAL LOW (ref 39.0–52.0)
Hemoglobin: 13.1 g/dL (ref 13.0–17.0)
MCH: 32.5 pg (ref 26.0–34.0)
MCHC: 34 g/dL (ref 30.0–36.0)
MCV: 95.5 fL (ref 80.0–100.0)
Platelets: 212 10*3/uL (ref 150–400)
RBC: 4.03 MIL/uL — ABNORMAL LOW (ref 4.22–5.81)
RDW: 15.4 % (ref 11.5–15.5)
WBC: 3.5 10*3/uL — ABNORMAL LOW (ref 4.0–10.5)
nRBC: 0 % (ref 0.0–0.2)

## 2021-09-04 LAB — BRAIN NATRIURETIC PEPTIDE: B Natriuretic Peptide: >4500 pg/mL — ABNORMAL HIGH (ref 0.0–100.0)

## 2021-09-04 LAB — HIV ANTIBODY (ROUTINE TESTING W REFLEX): HIV Screen 4th Generation wRfx: NONREACTIVE

## 2021-09-04 LAB — GLUCOSE, CAPILLARY: Glucose-Capillary: 81 mg/dL (ref 70–99)

## 2021-09-04 LAB — TSH: TSH: 2.221 u[IU]/mL (ref 0.350–4.500)

## 2021-09-04 MED ORDER — AZITHROMYCIN 250 MG PO TABS
500.0000 mg | ORAL_TABLET | Freq: Every day | ORAL | Status: DC
  Administered 2021-09-04 – 2021-09-05 (×2): 500 mg via ORAL
  Filled 2021-09-04 (×3): qty 2

## 2021-09-04 MED ORDER — SODIUM CHLORIDE 0.9 % IV SOLN
1.0000 g | INTRAVENOUS | Status: DC
  Administered 2021-09-04: 1 g via INTRAVENOUS
  Filled 2021-09-04: qty 10

## 2021-09-04 MED ORDER — SODIUM CHLORIDE 0.9 % IV BOLUS: 250.0000 mL | Freq: Once | INTRAVENOUS | Status: DC

## 2021-09-04 MED ORDER — PERFLUTREN LIPID MICROSPHERE
1.0000 mL | INTRAVENOUS | Status: AC | PRN
  Administered 2021-09-04: 8 mL via INTRAVENOUS
  Filled 2021-09-04: qty 10

## 2021-09-04 NOTE — Hospital Course (Signed)
[name] is a 51 y.o. 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  He was having increased worsening swelling of his extremities so he called his cardiologist and he was told to increase his evening dose of torsemide to 40 mg twice daily.  Normally he takes 40 mg of torsemide in the morning and 20 mg in the evening.  He denies any pleuritic chest pain, shortness of breath or chest pain but has not weighed himself and is unclear if he has had any weight gains.  He denies any sick contacts but he is concerned that he may have pneumonia given his cough.  He called his cardiologist Dr. Haroldine Laws but unfortunately [name] is on a trip to [place].  Chest x-ray done on admission and showed pulmonary vasculature congestion and markings suggestive of mild pulmonary edema.  Patient denies any nausea, vomiting, diarrhea, changes in bowel and thinks his leg swelling is a little bit better with the increased dose of Lasix but states that his cough and congestion is bad.  TRH has been asked to admit this patient for his  cough and acute on chronic systolic and diastolic CHF. ? ?**Interim History ?Overnight the patient spiked a slight temperature and his BNP elevated.  Cardiology was consulted and repeat echo was done and showed no real change in potential EF less than 20% and grade 3 diastolic dysfunction.  Patient was diuresed and however became little hypotensive this morning so his spironolactone and Entresto were held.  Given his slight temperature and because his is now leukopenic we will empirically treat him for a pneumonia infection but likely this is viral. ?

## 2021-09-04 NOTE — Progress Notes (Signed)
BP 96/72, HR 79, O2 96% on 2L/. Notified Dr. Alfredia Ferguson. Pt resting at this time, respirations even and non-labored. No s/s of acute distress. ?

## 2021-09-04 NOTE — Progress Notes (Signed)
?   09/04/21 0618  ?Assess: MEWS Score  ?Temp 98.3 ?F (36.8 ?C)  ?BP (!) 71/47  ?Pulse Rate 78  ?Resp 17  ?SpO2 (!) 86 %  ?O2 Device Nasal Cannula  ?Assess: MEWS Score  ?MEWS Temp 0  ?MEWS Systolic 2  ?MEWS Pulse 0  ?MEWS RR 0  ?MEWS LOC 0  ?MEWS Score 2  ?MEWS Score Color Yellow  ?Assess: if the MEWS score is Yellow or Red  ?Were vital signs taken at a resting state? Yes  ?Focused Assessment No change from prior assessment  ?MEWS guidelines implemented *See Row Information* Yes  ?Treat  ?MEWS Interventions Escalated (See documentation below)  ?Pain Scale 0-10  ?Pain Score 0  ?Take Vital Signs  ?Increase Vital Sign Frequency  Yellow: Q 2hr X 2 then Q 4hr X 2, if remains yellow, continue Q 4hrs  ?Escalate  ?MEWS: Escalate Yellow: discuss with charge nurse/RN and consider discussing with provider and RRT  ?Notify: Charge Nurse/RN  ?Name of Charge Nurse/RN Notified Krisiti, RN  ?Date Charge Nurse/RN Notified 09/04/21  ?Time Charge Nurse/RN Notified [phone]  ?Notify: Provider  ?Provider Name/Title Adefesso  ?Date Provider Notified 09/04/21  ?Time Provider Notified [phone]  ?Notification Type Page  ?Notification Reason Change in status  ? ?Patient hypotensive this AM with mild hypoxia.  MD and charge aware.  Denies pain, does not appear to be in any distress and is not tachypneic at this time.   VS frequency increased per protocol ?

## 2021-09-04 NOTE — Consult Note (Signed)
?Cardiology Consultation:  ? ?Patient ID: [name] ?MRN: [id number]; DOB: March 15, 1971 ? ?Admit date: 09/03/2021 ?Date of Consult: 09/04/2021 ? ?PCP:  Susy Frizzle, MD ?  ?[facility] Providers ?Cardiologist:  None      ? ? ?Patient Profile:  ? ?[name] is a 51 y.o. male with a hx of NICM who is being seen 09/04/2021 for the evaluation of acute on chronic systolic CHF at the request of Dr. Alfredia Ferguson. ? ?History of Present Illness:  ? ?Mr. Bilotti is a 51 yo male patient with NICM9normal cors cath 09/1658), chronic systolic CHF S/P ICD followed by Dr. 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  ? ?Patient saw Dr. Haroldine Laws 08/01/21 and was stable, couldn't tolerate titration of meds. Consider chest CT to understand why spirometry was down on CPX 12/2020. ? ?Patient says he has overdone it the past couple weeks installing a floor with heavy lifting and pounding nails. He is pretty careful about salt but had taco's Sat. He called CHF office Friday with cough and dyspnea but Dr. Haroldine Laws not available. He came in with mostly worsening cough. CXR today small right pleural effusion, unchanged mild, diffuse pulm opacity most consistent with edema. Has diuresed 1 L. Has temp >100.  ? ? ?Past Medical History:  ?Diagnosis Date  ? AICD (automatic cardioverter/defibrillator) present   ? Arthritis   ? Cardiomyopathy, nonischemic ([place])   ? takes Digoxin daily and Carvedilol daily  ? CHF (congestive heart failure) ([place])   ? takes Lasix daily as well Aldactone  ? Chronic back pain   ? Chronic systolic heart failure ([place])   ? Cirrhosis ([place])   ? Depression   ? takes Prozac daily  ? GERD (gastroesophageal reflux disease)   ? takes Omeprazole daily  ? Hx of Alcohol consumption heavy   ? rare beer currently  ? hx of Tobacco abuse   ? quit smoking 2014  ? Hyperlipidemia   ? was on Simvastatin but has been off a year  ? Insomnia   ? takes Ambien as needed(just got script  yesterday)  ? Orthostatic hypotension   ? Scoliosis   ? Wilm's tumor   ? 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 35 (XRT and chemo)  ? ? ?Past Surgical History:  ?Procedure Laterality Date  ? CARPAL TUNNEL RELEASE Right 01/09/2013  ? Procedure: CARPAL TUNNEL RELEASE;  Surgeon: Winfield Cunas, MD;  Location: [facility];  Service: Neurosurgery;  Laterality: Right;  RIGHT carpal tunnel release  ? CARPAL TUNNEL RELEASE Left 01/30/2013  ? Procedure: LEFT CARPAL TUNNEL RELEASE;  Surgeon: Winfield Cunas, MD;  Location: [facility];  Service: Neurosurgery;  Laterality: Left;  LEFT Carpal Tunnel release  ? CHEST TUBE INSERTION Right 09/09/2013  ? Procedure: INSERTION PLEURAL DRAINAGE CATHETER;  Surgeon: Ivin Poot, MD;  Location: [facility];  Service: Thoracic;  Laterality: Right;  ? HYDROCELE EXCISION Right 04/11/2017  ? Procedure: RIGHT HYDROCELECTOMY ADULT;  Surgeon: Irine Seal, MD;  Location: [facility];  Service: Urology;  Laterality: Right;  ? hydrocelectomy  2008  ? ICD  05/25/2011  ? [employer] Endotak Reliance SG lead/Energen single chamber device  ? IMPLANTABLE CARDIOVERTER DEFIBRILLATOR IMPLANT N/A 05/25/2011  ? Procedure: IMPLANTABLE CARDIOVERTER DEFIBRILLATOR IMPLANT;  Surgeon: Evans Lance, MD;  Location: [facility] C[facility] LAB;  Service: Cardiovascular;  Laterality: N/A;  ? NEPHRECTOMY  36 yrs ago  ? Wilms Tumor  ? RIGHT HEART CATH N/A 01/29/2019  ? Procedure: RIGHT  HEART CATH;  Surgeon: Jolaine Artist, MD;  Location: [facility];  Service: Cardiovascular;  Laterality: N/A;  ? RIGHT HEART CATH N/A 09/08/2019  ? Procedure: RIGHT HEART CATH;  Surgeon: Jolaine Artist, MD;  Location: [facility];  Service: Cardiovascular;  Laterality: N/A;  ? RIGHT/LEFT HEART CATH AND CORONARY ANGIOGRAPHY N/A 02/12/2017  ? Procedure: RIGHT/LEFT HEART CATH AND CORONARY ANGIOGRAPHY;  Surgeon: Jolaine Artist, MD;  Location: [facility];  Service: Cardiovascular;  Laterality: N/A;  ? ULNAR NERVE TRANSPOSITION Right 01/09/2013  ?  Procedure: ULNAR NERVE DECOMPRESSION/TRANSPOSITION;  Surgeon: Winfield Cunas, MD;  Location: [facility];  Service: Neurosurgery;  Laterality: Right;  RIGHT ulnar nerve decompression  ?  ? ?Home Medications:  ?Prior to Admission medications   ?Medication Sig Start Date End Date Taking? Authorizing Provider  ?aspirin 81 MG tablet Take 81 mg by mouth daily.   Yes [provider]  ?carvedilol (COREG) 3.125 MG tablet TAKE 1 TABLET BY MOUTH TWICE DAILY WITH A MEAL 05/26/21  Yes [name], Shaune Pascal, MD  ?ENTRESTO 49-51 MG TAKE 1 TABLET BY MOUTH TWICE DAILY . APPOINTMENT REQUIRED FOR FUTURE REFILLS ?Patient taking differently: Take 1 tablet by mouth 2 (two) times daily. 01/24/21  Yes [name], Shaune Pascal, MD  ?fluticasone (FLONASE) 50 MCG/ACT nasal spray Place 2 sprays into both nostrils daily. 05/30/21  Yes Susy Frizzle, MD  ?oxyCODONE (OXY IR/ROXICODONE) 5 MG immediate release tablet Take 1 tablet (5 mg total) by mouth 3 (three) times daily as needed for severe pain. 08/29/21  Yes Susy Frizzle, MD  ?potassium chloride SA (KLOR-CON) 20 MEQ tablet Take 1 tablet (20 mEq total) by mouth daily. Take an additional 51mq with Metolazone. 09/22/19  Yes [name], DShaune Pascal MD  ?spironolactone (ALDACTONE) 25 MG tablet Take 1 tablet (25 mg total) by mouth daily. 11/08/20  Yes PSusy Frizzle MD  ?torsemide (DEMADEX) 20 MG tablet Take 2 tablets (40 mg total) by mouth daily. Take extra tab as needed for swelling 06/08/21  Yes [name], DShaune Pascal MD  ?Blood Glucose Monitoring Suppl (BLOOD GLUCOSE SYSTEM PAK) KIT Please dispense based on patient and insurance preference. Use to monitor FSBS 2-3x weekly. Dx: E11.9. 03/07/17   DAlycia Rossetti MD  ?dapagliflozin propanediol (FARXIGA) 5 MG TABS tablet Take 1 tablet (5 mg total) by mouth daily before breakfast. ?Patient not taking: Reported on 09/03/2021 08/01/21   [name], DShaune Pascal MD  ?Glucose Blood (BLOOD GLUCOSE TEST STRIPS) STRP Please dispense based on patient  and insurance preference. Use to monitor FSBS 2-3x weekly. Dx: E11.9. 03/07/17   DAlycia Rossetti MD  ?Lancet Devices MISC Please dispense based on patient and insurance preference. Use to monitor FSBS 2-3x weekly. Dx: E11.9. 03/07/17   DAlycia Rossetti MD  ?metolazone (ZAROXOLYN) 2.5 MG tablet TAKE 1 TABLET BY MOUTH AS DIRECTED BY  HEART  FAILURE  CLINIC ?Patient not taking: Reported on 09/03/2021 06/22/19   [name], DShaune Pascal MD  ? ? ?Inpatient Medications: ?Scheduled Meds: ? aspirin EC  81 mg Oral Daily  ? carvedilol  3.125 mg Oral BID WC  ? docusate sodium  100 mg Oral BID  ? enoxaparin (LOVENOX) injection  40 mg Subcutaneous Q24H  ? fluticasone  2 spray Each Nare Daily  ? furosemide  60 mg Intravenous Q12H  ? [START ON 09/05/2021] potassium chloride SA  20 mEq Oral Daily  ? potassium chloride  40 mEq Oral BID  ? ?Continuous Infusions: ?  sodium chloride    ? ?PRN Meds: ?acetaminophen **OR** acetaminophen, benzonatate, guaiFENesin-dextromethorphan, hydrALAZINE, ondansetron **OR** ondansetron (ZOFRAN) IV, oxyCODONE, polyethylene glycol ? ?Allergies:    ?Allergies  ?Allergen Reactions  ? Ibuprofen Hives  ? Nsaids   ?  ELEVATED LFT'S  ? ? ?Social History:   ?Social History  ? ?Socioeconomic History  ? Marital status: Single  ?  Spouse name: Not on file  ? Number of children: 1  ? Years of education: Not on file  ? Highest education level: Not on file  ?Occupational History  ? Occupation: Full time  ?  Comment: Engineer, civil (consulting)  ?Tobacco Use  ? Smoking status: Former  ?  Packs/day: 0.25  ?  Years: 20.00  ?  Pack years: 5.00  ?  Types: Cigarettes  ?  Quit date: 09/07/2012  ?  Years since quitting: 8.9  ? Smokeless tobacco: Current  ?  Types: Snuff  ?Vaping Use  ? Vaping Use: Never used  ?Substance and Sexual Activity  ? Alcohol use: Yes  ?  Alcohol/week: 0.0 standard drinks  ?  Comment: histoorically a heavy drinker ("beer only"), none for a couple months   ? Drug use: No  ? Sexual activity: Not Currently  ?Other  Topics Concern  ? Not on file  ?Social History Narrative  ? Not on file  ? ?Social Determinants of Health  ? ?Financial Resource Strain: Low Risk   ? Difficulty of Paying Living Expenses: Not hard at all  ?Food [employer]

## 2021-09-04 NOTE — Progress Notes (Signed)
?PROGRESS NOTE ? ? ? ?[name]  WGN:[id number] DOB: [birth year] DOA: 09/03/2021 ?PCP: Susy Frizzle, MD  ? ?Brief Narrative:  ?[name] is a 51 y.o. 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  He was having increased worsening swelling of his extremities so he called his cardiologist and he was told to increase his evening dose of torsemide to 40 mg twice daily.  Normally he takes 40 mg of torsemide in the morning and 20 mg in the evening.  He denies any pleuritic chest pain, shortness of breath or chest pain but has not weighed himself and is unclear if he has had any weight gains.  He denies any sick contacts but he is concerned that he may have pneumonia given his cough.  He called his cardiologist Dr. Haroldine Laws but unfortunately [name] is on a trip to [place].  Chest x-ray done on admission and showed pulmonary vasculature congestion and markings suggestive of mild pulmonary edema.  Patient denies any nausea, vomiting, diarrhea, changes in bowel and thinks his leg swelling is a little bit better  with the increased dose of Lasix but states that his cough and congestion is bad.  TRH has been asked to admit this patient for his cough and acute on chronic systolic and diastolic CHF. ? ?**Interim History ?Overnight the patient spiked a slight temperature and his BNP elevated.  Cardiology was consulted and repeat echo was done and showed no real change in potential EF less than 20% and grade 3 diastolic dysfunction.  Patient was diuresed and however became little hypotensive this morning so his spironolactone and Entresto were held.  Given his slight temperature and because his is now leukopenic we will empirically treat him for a pneumonia infection but likely this is viral.  ? ? ?Assessment and Plan: ?* Acute on chronic combined systolic and diastolic CHF (congestive heart failure) ([place]) ?- Has been having a worsening cough so we will admit as an inpatient given that he has an elevated BNP of 2289.0 and clinical signs of volume overload especially on his chest x-ray and lower extremity swelling;  ?-Repeat BNP was >4,500.0 and contiuing IV Lasix  ?-Patient had orthopnea ?-Given IV Lasix 60 mg once in the ED and will resume 60 mg IV twice daily and hold his home torsemide ?-Cardiology was consulted for further evaluation and will repeat an echocardiogram which is essentially unchanged ?-Patient is -958 mL ?-Hold his GDMT with Spironolactone, and Entresto given his Hypotension ?-C/w Carvedilol 3.125 mg po BID ?-We will need strict I's and O's and daily weights ?-Continue monitor for  signs symptoms of volume overload and repeat chest x-ray in the morning ?-Troponin slightly elevated at 34 and trended up to 35 and likely in the setting of demand ischemia from his volume overload ?-Repeat CXR this AM showed "Mild cardiomegaly. Unchanged small right pleural effusion and/or pleural thickening. Unchanged mild, diffuse interstitial pulmonary opacity most consistent with edema. No new or focal airspace opacity." ?   ? ?Hypokalemia ?- Patient's potassium was 3.8 this a.m. ?-Replete with p.o. potassium chloride 40 mg twice daily x2 doses yesterday ?-Continue to monitor and replete as necessary ?-Repeat CMP in the a.m. ? ?Hyponatremia ?-Patient's sodium is now 129 -> 132 ?-Likely in the setting of volume overload ?-Expect to improve with diuresis ?-Continue to monitor and trend and repeat CMP in a.m. ? ?Cough ?Acute Respiratory Failure with Hypoxia ?-He could have a viral illness or this could be secondary to his CHF ?-Patient was slightly febrile at 100.4 and his WBC is trended down to 3.5; empirically will treat him for a CAP  This is less likely ?-Initiate benzonatate ?-Check respiratory virus panel and pending  ?-His COVID-19 and influenza A and influenza B were negative repeat chest x-ray in a.m. and continue monitor and trend carefully ?-Continue with supportive care ?-If necessary will provide breathing treatments ?-He became hypoxic yesterday and is now on 2 L of supplemental oxygen ?-SpO2: 97 % ?O2 Flow Rate (L/min): 2 L/min ?FiO2 (%): 21 % ?-Continuous Pulse Oximetry and Maintain O2 Sats >90% ?-Continue supplemental O2 via [place] and wean O2 as Tolerated ?-Needs Repeat CXR and Ambulatory Home O2 Screen Prior to D/C  ? ?GERD (gastroesophageal reflux disease) ?- Does not appear to be on a PPI anymore but if necessary will provide him with PPI ? ?Cirrhosis of liver ([place]) ?- In the history of alcoholism. ?-Currently getting diuresed with IV Lasix now ?-Check LFTs in the morning ?-His T. bili was slightly elevated at 1.3 -> 1.4 will need to continue monitor and trend  ? ?Hyperlipidemia ?- Does not appear to be taking statin anymore and will defer to cardiology for further evaluation and consideration for repeat lipid panel while hospitalized ? ?Depression ?- Has not active SI or HI and does not appear to be taking any medications by his MAR ? ?Nonischemic dilated cardiomyopathy ([place]) ?-Has an EF of less than 20% and grade 3  diastolic dysfunction and on repeat echo it is about the same ?-Initiated on IV diuretic 60 mg twice daily and will continue as blood pressure allows ?-Cardiology been consulted for further evaluation and we will repeat a chest x-ray and echocardiogram ?-Resumed his home medications Coreg 3.25 mg p.o. twice daily, aspirin 81 mg p.o. daily, Entresto 49-51; as well as spironolactone 25 mg p.o. daily ?-Unfortunately because of his hypotension is Entresto and spironolactone were held ?-Reportedly no longer taking his [name] ?-Holding his metolazone and torsemide for now ?-Further evaluation recommendation by cardiology ?-Troponins were mildly elevated likely secondary to demand ischemia ?-Patient is being worked up for a possible heart transplant and prior to admission has been on Farxiga, torsemide, Coreg and Entresto ?-Continues to have congestion on lung examination ?-Does not desire to be transferred to [facility] and wants to follow-up with Dr. Haroldine Laws as an outpatient ? ?DVT prophylaxis: enoxaparin (LOVENOX) injection 40 mg Start: 09/03/21 2315 ?SCDs Start: 09/03/21 2228 ? ?  Code Status: Full Code ?Family Communication: Family currently at bedside  ? ?Disposition Plan:  ?Level of care: Telemetry ?Status is: Inpatient ?Remains inpatient appropriate because: Remains  volume overloaded and now on Supplemental O2 via [place] ?  ?Consultants:  ?Cardiology ? ?Procedures:  ?ECHOCARDIOGRAM ?IMPRESSIONS  ? ? ? 1. Left ventricular ejection fraction, by estimation, is <20%. The left  ?ventricle has severely decreased function. The left ventricle demonstrates  ?global hypokinesis. The left ventricular internal cavity size was  ?moderately dilated. Left ventricular  ?diastolic parameters are consistent with Grade III diastolic dysfunction  ?(restrictive).  ? 2. Right ventricular systolic function is normal. The right ventricular  ?size is mildly enlarged. There is moderately elevated pulmonary artery  ?systolic pressure. The  estimated right ventricular systolic pressure is  ?91.3 mmHg.  ? 3. Left atrial size was moderately dilated.  ? 4. Right atrial size was moderately dilated.  ? 5. The mitral valve is normal in structure. No evidenc

## 2021-09-04 NOTE — Progress Notes (Incomplete)
*  PRELIMINARY RESULTS* ?Echocardiogram ?2D Echocardiogram has been performed. ? ?[name] ?09/04/2021, 10:44 AM ?

## 2021-09-04 NOTE — Progress Notes (Signed)
?  Transition of Care (TOC) Screening Note ? ? ?Patient Details  ?Name: [name] ?Date of Birth: 1971/05/04 ? ? ?Transition of Care (TOC) CM/SW Contact:    ?Boneta Lucks, RN ?Phone Number: ?09/04/2021, 11:12 AM ? ? ? Following possible transfer to [facility]. ? ?Transition of Care Department [facility]) has reviewed patient and no TOC needs have been identified at this time. We will continue to monitor patient advancement through interdisciplinary progression rounds. If new patient transition needs arise, please place a TOC consult. ? ? ?

## 2021-09-04 NOTE — Progress Notes (Signed)
?   09/04/21 1959  ?Assess: MEWS Score  ?Temp 98.7 ?F (37.1 ?C)  ?Pulse Rate 89  ?Resp (!) 22  ?SpO2 93 %  ?O2 Device Room Air  ?Assess: MEWS Score  ?MEWS Temp 0  ?MEWS Systolic 1  ?MEWS Pulse 0  ?MEWS RR 1  ?MEWS LOC 0  ?MEWS Score 2  ?MEWS Score Color Yellow  ?Assess: if the MEWS score is Yellow or Red  ?Were vital signs taken at a resting state? Yes  ?Focused Assessment No change from prior assessment  ?Early Detection of Sepsis Score *See Row Information* Low  ?MEWS guidelines implemented *See Row Information* Yes  ?Take Vital Signs  ?Increase Vital Sign Frequency  Yellow: Q 2hr X 2 then Q 4hr X 2, if remains yellow, continue Q 4hrs  ?Notify: Charge Nurse/RN  ?Name of Charge Nurse/RN Notified Deeann Dowse RN  ?Date Charge Nurse/RN Notified 09/04/21  ?Time Charge Nurse/RN Notified 2020  ? ? ?

## 2021-09-05 ENCOUNTER — Inpatient Hospital Stay (HOSPITAL_COMMUNITY): Payer: Medicare HMO

## 2021-09-05 DIAGNOSIS — K7031 Alcoholic cirrhosis of liver with ascites: Secondary | ICD-10-CM

## 2021-09-05 DIAGNOSIS — I420 Dilated cardiomyopathy: Secondary | ICD-10-CM | POA: Diagnosis not present

## 2021-09-05 DIAGNOSIS — I5023 Acute on chronic systolic (congestive) heart failure: Secondary | ICD-10-CM | POA: Diagnosis not present

## 2021-09-05 DIAGNOSIS — I5043 Acute on chronic combined systolic (congestive) and diastolic (congestive) heart failure: Secondary | ICD-10-CM | POA: Diagnosis not present

## 2021-09-05 DIAGNOSIS — E871 Hypo-osmolality and hyponatremia: Secondary | ICD-10-CM | POA: Diagnosis not present

## 2021-09-05 DIAGNOSIS — I517 Cardiomegaly: Secondary | ICD-10-CM | POA: Diagnosis not present

## 2021-09-05 DIAGNOSIS — E785 Hyperlipidemia, unspecified: Secondary | ICD-10-CM | POA: Diagnosis not present

## 2021-09-05 DIAGNOSIS — I509 Heart failure, unspecified: Secondary | ICD-10-CM | POA: Diagnosis not present

## 2021-09-05 DIAGNOSIS — R051 Acute cough: Secondary | ICD-10-CM | POA: Diagnosis not present

## 2021-09-05 DIAGNOSIS — K2100 Gastro-esophageal reflux disease with esophagitis, without bleeding: Secondary | ICD-10-CM | POA: Diagnosis not present

## 2021-09-05 DIAGNOSIS — R69 Illness, unspecified: Secondary | ICD-10-CM | POA: Diagnosis not present

## 2021-09-05 DIAGNOSIS — E876 Hypokalemia: Secondary | ICD-10-CM | POA: Diagnosis not present

## 2021-09-05 LAB — COMPREHENSIVE METABOLIC PANEL
ALT: 15 U/L (ref 0–44)
AST: 35 U/L (ref 15–41)
Albumin: 2.9 g/dL — ABNORMAL LOW (ref 3.5–5.0)
Alkaline Phosphatase: 213 U/L — ABNORMAL HIGH (ref 38–126)
Anion gap: 8 (ref 5–15)
BUN: 16 mg/dL (ref 6–20)
CO2: 28 mmol/L (ref 22–32)
Calcium: 8.2 mg/dL — ABNORMAL LOW (ref 8.9–10.3)
Chloride: 98 mmol/L (ref 98–111)
Creatinine, Ser: 1.02 mg/dL (ref 0.61–1.24)
GFR, Estimated: >60 mL/min (ref >60)
Glucose, Bld: 113 mg/dL — ABNORMAL HIGH (ref 70–99)
Potassium: 3.1 mmol/L — ABNORMAL LOW (ref 3.5–5.1)
Sodium: 134 mmol/L — ABNORMAL LOW (ref 135–145)
Total Bilirubin: 0.9 mg/dL (ref 0.3–1.2)
Total Protein: 6.4 g/dL — ABNORMAL LOW (ref 6.5–8.1)

## 2021-09-05 LAB — CBC WITH DIFFERENTIAL/PLATELET
Abs Immature Granulocytes: 0.01 10*3/uL (ref 0.00–0.07)
Basophils Absolute: 0 10*3/uL (ref 0.0–0.1)
Basophils Relative: 1 %
Eosinophils Absolute: 0 10*3/uL (ref 0.0–0.5)
Eosinophils Relative: 1 %
HCT: 37.7 % — ABNORMAL LOW (ref 39.0–52.0)
Hemoglobin: 12.8 g/dL — ABNORMAL LOW (ref 13.0–17.0)
Immature Granulocytes: 0 %
Lymphocytes Relative: 12 %
Lymphs Abs: 0.4 10*3/uL — ABNORMAL LOW (ref 0.7–4.0)
MCH: 32.8 pg (ref 26.0–34.0)
MCHC: 34 g/dL (ref 30.0–36.0)
MCV: 96.7 fL (ref 80.0–100.0)
Monocytes Absolute: 0.4 10*3/uL (ref 0.1–1.0)
Monocytes Relative: 13 %
Neutro Abs: 2.2 10*3/uL (ref 1.7–7.7)
Neutrophils Relative %: 73 %
Platelets: 206 10*3/uL (ref 150–400)
RBC: 3.9 MIL/uL — ABNORMAL LOW (ref 4.22–5.81)
RDW: 15.5 % (ref 11.5–15.5)
WBC: 3.1 10*3/uL — ABNORMAL LOW (ref 4.0–10.5)
nRBC: 0 % (ref 0.0–0.2)

## 2021-09-05 LAB — MAGNESIUM: Magnesium: 1.7 mg/dL (ref 1.7–2.4)

## 2021-09-05 LAB — GLUCOSE, CAPILLARY: Glucose-Capillary: 115 mg/dL — ABNORMAL HIGH (ref 70–99)

## 2021-09-05 LAB — PHOSPHORUS: Phosphorus: 2.7 mg/dL (ref 2.5–4.6)

## 2021-09-05 MED ORDER — POLYETHYLENE GLYCOL 3350 17 G PO PACK: 17.0000 g | PACK | Freq: Every day | ORAL | 0 refills | Status: DC | PRN

## 2021-09-05 MED ORDER — TORSEMIDE 20 MG PO TABS
20.0000 mg | ORAL_TABLET | Freq: Every day | ORAL | 0 refills | Status: DC
Start: 2021-09-05 — End: 2021-09-11

## 2021-09-05 MED ORDER — POTASSIUM CHLORIDE CRYS ER 20 MEQ PO TBCR
40.0000 meq | EXTENDED_RELEASE_TABLET | Freq: Two times a day (BID) | ORAL | Status: DC
  Administered 2021-09-05: 40 meq via ORAL
  Filled 2021-09-05: qty 2

## 2021-09-05 MED ORDER — POTASSIUM CHLORIDE CRYS ER 20 MEQ PO TBCR: 40.0000 meq | EXTENDED_RELEASE_TABLET | Freq: Every day | ORAL | Status: DC

## 2021-09-05 MED ORDER — BENZONATATE 100 MG PO CAPS: 100.0000 mg | ORAL_CAPSULE | Freq: Three times a day (TID) | ORAL | 0 refills | Status: DC | PRN

## 2021-09-05 MED ORDER — DOCUSATE SODIUM 100 MG PO CAPS: 100.0000 mg | ORAL_CAPSULE | Freq: Two times a day (BID) | ORAL | 0 refills | Status: DC

## 2021-09-05 MED ORDER — MAGNESIUM SULFATE 2 GM/50ML IV SOLN
2.0000 g | Freq: Once | INTRAVENOUS | Status: AC
  Administered 2021-09-05: 2 g via INTRAVENOUS
  Filled 2021-09-05: qty 50

## 2021-09-05 MED ORDER — LIVING BETTER WITH HEART FAILURE BOOK: Freq: Once | Status: DC

## 2021-09-05 MED ORDER — CEFDINIR 300 MG PO CAPS: 300.0000 mg | ORAL_CAPSULE | Freq: Two times a day (BID) | ORAL | 0 refills | Status: AC

## 2021-09-05 MED ORDER — AZITHROMYCIN 500 MG PO TABS: 250.0000 mg | ORAL_TABLET | Freq: Every day | ORAL | 0 refills | Status: AC

## 2021-09-05 NOTE — Evaluation (Signed)
Occupational Therapy Evaluation ?Patient Details ?Name: [name] ?MRN: [id number] ?DOB: 07/28/1970 ?Today's Date: 09/05/2021 ? ? ?History of Present Illness DELLA HOMAN is a 51 y.o. 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  He was having increased worsening swelling of his extremities so he called his cardiologist and he was told to increase his evening dose of torsemide to 40 mg twice daily.  Normally he takes 40 mg of torsemide in the morning and 20 mg in the evening.  He denies any pleuritic chest pain, shortness of breath or chest pain but has not weighed himself and is unclear if he has had any weight gains.  He denies any sick contacts but he is concerned that he may have pneumonia given his cough.  He called his cardiologist Dr. Haroldine Laws but unfortunately [name] is on vacation in [place].  Chest x-ray done on admission and showed pulmonary vasculature congestion and markings suggestive of mild pulmonary edema.  Patient denies any nausea, vomiting, diarrhea, changes in bowel and thinks his leg  swelling is a little bit better with the increased dose of Lasix but states that his cough and congestion is bad.  [facility]H has been asked to admit this patient for his cough and acute on chronic systolic and diastolic CHF. (per MD note)  ? ?Clinical Impression ?  ?Pt agreeable to OT and PT co-evaluation. Pt presents at or near baseline levels with independent mobility in room and hall and independent bed mobility. Donning shoes independently with WFL B UE functional use. Pt is not recommended for further acute OT services and will be discharged to care of nursing staff for remaining length of stay.  ?   ? ?Recommendations for follow up therapy are one component of a multi-disciplinary discharge planning process, led by the attending physician.  Recommendations may be updated based on patient status, additional functional criteria and insurance authorization.  ? ?Follow Up Recommendations ? No OT follow up  ?  ?Assistance Recommended at Discharge None  ?Patient can return home with the following   ? ?  ?Functional Status Assessment ? Patient has not had a recent decline in their functional status  ?Equipment Recommendations ? None recommended by OT  ?  ?Recommendations for Other Services   ? ? ?  ?Precautions / Restrictions Precautions ?Precautions: None ?Restrictions ?Weight Bearing Restrictions: No  ? ?  ? ?Mobility Bed Mobility ?Overal bed mobility: Independent ?  ?  ?  ?  ?  ?  ?  ?  ? ?Transfers ?Overall transfer level: Independent ?  ?  ?  ?  ?  ?  ?  ?  ?  ?  ? ?  ?  Balance Overall balance assessment: Independent ?  ?  ?  ?  ?  ?  ?  ?  ?  ?  ?  ?  ?  ?  ?  ?  ?  ?  ?   ? ?ADL either performed or assessed with clinical judgement  ? ?ADL Overall ADL's : Independent ?  ?  ?  ?  ?  ?  ?  ?  ?  ?  ?  ?  ?  ?  ?  ?  ?  ?  ?  ?   ? ? ? ?Vision Baseline Vision/History: 0 No visual deficits ?Ability to See in Adequate Light: 0 Adequate ?Patient Visual Report: No change from baseline ?Vision Assessment?: No apparent visual  deficits  ?   ?   ?  ?   ?  ? ?Pertinent Vitals/Pain Pain Assessment ?Pain Assessment: No/denies pain  ? ? ? ?Hand Dominance Right ?  ?Extremity/Trunk Assessment Upper Extremity Assessment ?Upper Extremity Assessment: Overall WFL for tasks assessed ?  ?Lower Extremity Assessment ?Lower Extremity Assessment: Defer to PT evaluation ?  ?Cervical / Trunk Assessment ?Cervical / Trunk Assessment: Normal ?  ?Communication Communication ?Communication: No difficulties ?  ?Cognition Arousal/Alertness: Awake/alert ?Behavior During Therapy: [facility] for tasks assessed/performed ?Overall Cognitive Status: Within Functional Limits for tasks assessed ?  ?  ?  ?  ?  ?  ?  ?  ?  ?  ?  ?  ?  ?  ?  ?  ?  ?  ?  ?   ? ?  ?   ?  ?    ? ? ?Home Living Family/patient expects to be discharged to:: Private residence ?Living Arrangements: Children ?Available Help at Discharge: Family;Available PRN/intermittently ?Type of Home: House ?Home Access: Stairs to enter ?Entrance Stairs-Number of Steps: 1 ?Entrance Stairs-Rails: Can reach both ?Home Layout: One level ?  ?  ?Bathroom Shower/Tub: Tub/shower unit ?  ?Bathroom Toilet: Standard ?Bathroom Accessibility: Yes ?How Accessible: Accessible via walker ?Home Equipment: None ?  ?  ?  ? ?  ?Prior Functioning/Environment Prior Level of Function : Independent/Modified Independent ?  ?  ?  ?  ?  ?  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 ?ADLs Comments: Independent; drives ?  ? ?  ?  ?   ?  ?   ?    ?  ?OT Goals(Current goals can be found in the care plan section) Acute Rehab OT Goals ?Patient Stated Goal: return home  ?OT Frequency:   ?  ? ?Co-evaluation PT/OT/SLP Co-Evaluation/Treatment: Yes ?Reason for Co-Treatment: To address functional/ADL transfers ?  ?OT goals addressed during session: ADL's and self-care ?  ? ?  ?   ?  ?  ?  ?  ?  ?  ?  ?End of Session   ? ?Activity Tolerance: Patient tolerated treatment well ?Patient left: in bed;with call bell/phone within reach ? ?OT Visit  Diagnosis: Other (comment);Unsteadiness on feet (R26.81) (cough)  ?              ?Time: 8338-2505 ?OT Time Calculation (min): 9 min ?Charges:  OT General Charges ?$OT Visit: 1 Visit ?OT Evaluation ?$OT Eval Low Complexity: 1 Low ? ?Larey Seat OT, MOT ? ?Larey Seat ?09/05/2021, 9:55 AM ?

## 2021-09-05 NOTE — TOC Transition Note (Signed)
Transition of Care (TOC) - CM/SW Discharge Note ? ? ?Patient Details  ?Name: [name] ?MRN: [id number] ?Date of Birth: [birth year] ? ?Transition of Care (TOC) CM/SW Contact:  ?Salome Arnt, LCSW ?Phone Number: ?09/05/2021, 10:48 AM ? ? ?Clinical Narrative:  TOC received consult for CHF screening. PT/OT evaluated pt and no follow up needed. LCSW offered to set up home health RN, but pt declined. Living Well with CHF book ordered. No other needs reported by pt. Anticipate d/c later today per MD.    ? ? ? ?Final next level of care: Home/Self Care ?Barriers to Discharge: Barriers Resolved ? ? ?Patient Goals and CMS Choice ?Patient states their goals for this hospitalization and ongoing recovery are:: return home ?  ?Choice offered to / list presented to : Patient ? ?Discharge Placement ?  ?           ?  ?  ?  ?Patient and family notified of of transfer: 09/05/21 ? ?Discharge Plan and Services ?  ?  ?           ?  ?  ?  ?  ?  ?HH Arranged: Refused HH ?  ?  ?  ?  ? ?Social Determinants of Health (SDOH) Interventions ?  ? ? ?Readmission Risk Interventions ?   ? View : No data to display.  ?  ?  ?  ? ? ? ? ? ?

## 2021-09-05 NOTE — Progress Notes (Signed)
? ?Progress Note ? ?Patient Name: [name] ?Date of Encounter: 09/05/2021 ? ?[facility] Cardiologist: Glori Bickers, MD  ? ?Subjective  ? ?Feeling better, less short of breath.  Still has a mild cough but feels like this is improving.  He would like to go home. ? ?Inpatient Medications  ?  ?Scheduled Meds: ? aspirin EC  81 mg Oral Daily  ? azithromycin  500 mg Oral Daily  ? carvedilol  3.125 mg Oral BID WC  ? docusate sodium  100 mg Oral BID  ? enoxaparin (LOVENOX) injection  40 mg Subcutaneous Q24H  ? fluticasone  2 spray Each Nare Daily  ? furosemide  60 mg Intravenous Q12H  ? potassium chloride  40 mEq Oral BID  ? [START ON 09/06/2021] potassium chloride  40 mEq Oral Daily  ? ?Continuous Infusions: ? cefTRIAXone (ROCEPHIN)  IV 1 g (09/04/21 1431)  ? magnesium sulfate bolus IVPB    ? sodium chloride    ? ?PRN Meds: ?acetaminophen **OR** acetaminophen, benzonatate, guaiFENesin-dextromethorphan, hydrALAZINE, ondansetron **OR** ondansetron (ZOFRAN) IV, oxyCODONE, polyethylene glycol  ? ?Vital Signs  ?  ?Vitals:  ? 09/04/21 1959 09/05/21 0008 09/05/21 0354 09/05/21 0500  ?BP:  96/75 98/69   ?Pulse: 89 97 94   ?Resp: (!) 22 (!) 25 (!) 24   ?Temp: 98.7 ?F (37.1 ?C) 99.6 ?F (37.6 ?C) 99.8 ?F (37.7 ?C)   ?TempSrc: Oral Oral Oral   ?SpO2: 93% 95% (!) 88%   ?Weight:    67.4 kg  ?Height:      ? ? ?Intake/Output Summary (Last 24 hours) at 09/05/2021 1791 ?Last data filed at 09/05/2021 5056 ?Gross per 24 hour  ?Intake 994.71 ml  ?Output 750 ml  ?Net 244.71 ml  ? ? ?  09/05/2021  ?  5:00 AM 09/04/2021  ?  6:18 AM 09/03/2021  ? 10:18 PM  ?Last 3 Weights  ?Weight (lbs) 148 lb 9.4 oz 150 lb 12.7 oz 153 lb 10.6 oz  ?Weight (kg) 67.4 kg 68.4 kg 69.7 kg  ?   ? ?Telemetry  ?  ?Sinus rhythm with occasional PVCs.  No adverse arrhythmias- Personally Reviewed ? ?ECG  ?  ?Sinus rhythm 102 interventricular conduction delay.  No change from prior.- Personally Reviewed ? ?Physical Exam  ? ?GEN: No acute distress.   ?Neck: No  JVD ?Cardiac: RRR, 2/6 systolic murmur, no rubs, or gallops.  ?Respiratory: Mild wheeze/crackle bilaterally. ?GI: Soft, nontender, non-distended  ?MS: No edema; No deformity. ?Neuro:  Nonfocal  ?Psych: Normal affect  ? ?Labs  ?  ?High Sensitivity Troponin:   ?Recent Labs  ?Lab 09/03/21 ?0959 09/03/21 ?1221  ?TROPONINIHS 34* 35*  ?   ?Chemistry ?Recent Labs  ?Lab 09/03/21 ?0959 09/04/21 ?0409 09/05/21 ?9794  ?NA 129* 132* 134*  ?K 3.4* 3.8 3.1*  ?CL 89* 95* 98  ?CO2 '27 25 28  '$ ?GLUCOSE 98 108* 113*  ?BUN '14 19 16  '$ ?CREATININE 1.03 1.09 1.02  ?CALCIUM 8.1* 8.0* 8.2*  ?MG  --   --  1.7  ?PROT 6.7 6.1* 6.4*  ?ALBUMIN 3.1* 2.9* 2.9*  ?AST 38 33 35  ?ALT '19 17 15  '$ ?ALKPHOS 255* 228* 213*  ?BILITOT 1.3* 1.4* 0.9  ?GFRNONAA >60 >60 >60  ?ANIONGAP '13 12 8  '$ ?  ?Lipids No results for input(s): CHOL, TRIG, HDL, LABVLDL, LDLCALC, CHOLHDL in the last 168 hours.  ?Hematology ?Recent Labs  ?Lab 09/03/21 ?0959 09/04/21 ?0409 09/05/21 ?8016  ?WBC 4.4 3.5* 3.1*  ?RBC 4.24 4.03* 3.90*  ?  HGB 13.7 13.1 12.8*  ?HCT 40.7 38.5* 37.7*  ?MCV 96.0 95.5 96.7  ?MCH 32.3 32.5 32.8  ?MCHC 33.7 34.0 34.0  ?RDW 15.4 15.4 15.5  ?PLT 231 212 206  ? ?Thyroid  ?Recent Labs  ?Lab 09/04/21 ?0409  ?TSH 2.221  ?  ?BNP ?Recent Labs  ?Lab 09/03/21 ?0959 09/04/21 ?0409  ?BNP 2,289.0* >4,500.0*  ?  ?DDimer No results for input(s): DDIMER in the last 168 hours.  ? ?Radiology  ?  ?X-ray chest PA and lateral ? ?Result Date: 09/04/2021 ?CLINICAL DATA:  Shortness of breath, cough EXAM: CHEST - 2 VIEW COMPARISON:  09/03/2021 FINDINGS: Mild cardiomegaly with left chest single lead pacer defibrillator. Unchanged small right pleural effusion and/or pleural thickening. Mild, diffuse interstitial pulmonary opacity, unchanged. The visualized skeletal structures are unremarkable. IMPRESSION: 1. Mild cardiomegaly. 2. Unchanged small right pleural effusion and/or pleural thickening. 3. Unchanged mild, diffuse interstitial pulmonary opacity most consistent with edema. No new or  focal airspace opacity. Electronically Signed   By: Delanna Ahmadi M.D.   On: 09/04/2021 08:36  ? ?DG Chest 2 View ? ?Result Date: 09/03/2021 ?CLINICAL DATA:  51 year old male with history of cough and shortness of breath. EXAM: CHEST - 2 VIEW COMPARISON:  Chest x-ray 06/08/2017. FINDINGS: There is cephalization of the pulmonary vasculature and slight indistinctness of the interstitial markings suggestive of mild pulmonary edema. Small right pleural effusion. No definite left pleural effusion. No pneumothorax. Mild cardiomegaly. Upper mediastinal contours are within normal limits. Left sided pacemaker/AICD with lead tip projecting over the expected location of the right ventricular apex. IMPRESSION: 1. The appearance the chest suggests mild congestive heart failure, as above. Electronically Signed   By: Vinnie Langton M.D.   On: 09/03/2021 11:00  ? ?DG CHEST PORT 1 VIEW ? ?Result Date: 09/05/2021 ?CLINICAL DATA:  Congestive heart failure.  History of cardiomyopathy EXAM: PORTABLE CHEST 1 VIEW COMPARISON:  Yesterday FINDINGS: Pacer/ICD with tip at right ventricle. Midline trachea. Mild cardiomegaly. Trace right pleural fluid or thickening, similar. No pneumothorax. Interstitial edema is improved with mild pulmonary venous congestion remaining. Patchy right upper lobe interstitial opacities are somewhat more apparent given improvement in diffuse process. IMPRESSION: Cardiomegaly with improved interstitial edema. Mild pulmonary venous congestion remaining. Primarily right upper lobe interstitial opacities remain and are likely related to venous congestion. Concurrent infection, including atypical etiologies, could look similar. Electronically Signed   By: Abigail Miyamoto M.D.   On: 09/05/2021 08:15  ? ?ECHOCARDIOGRAM COMPLETE ? ?Result Date: 09/04/2021 ?   ECHOCARDIOGRAM REPORT   Patient Name:   [name] Date of Exam: 09/04/2021 Medical Rec #:  [id number]      Height:       67.0 in Accession #:    [id number]     Weight:        150.8 lb Date of Birth:  1970/11/21      BSA:          1.794 m? Patient Age:    51 years       BP:           96/72 mmHg Patient Gender: M              HR:           85 bpm. Exam Location:  [facility] Procedure: 2D Echo, Cardiac Doppler, Color Doppler and Intracardiac            Opacification Agent Indications:     CHF  History:  Patient has prior history of Echocardiogram examinations, most                  recent 12/27/2020. CHF, Defibrillator; Risk Factors:Diabetes and                  Dyslipidemia. ETOH and Tobacco abuse                  Images by Lonn Georgia, student.  Sonographer:     Wenda Low Referring Phys:  [id number] Georgina Quint LATIF [facility] Diagnosing Phys: Candee Furbish MD  Sonographer Comments: Image acquisition challenging due to respiratory motion. IMPRESSIONS  1. Left ventricular ejection fraction, by estimation, is <20%. The left ventricle has severely decreased function. The left ventricle demonstrates global hypokinesis. The left ventricular internal cavity size was moderately dilated. Left ventricular diastolic parameters are consistent with Grade III diastolic dysfunction (restrictive).  2. Right ventricular systolic function is normal. The right ventricular size is mildly enlarged. There is moderately elevated pulmonary artery systolic pressure. The estimated right ventricular systolic pressure is 35.5 mmHg.  3. Left atrial size was moderately dilated.  4. Right atrial size was moderately dilated.  5. The mitral valve is normal in structure. No evidence of mitral valve regurgitation. No evidence of mitral stenosis.  6. Tricuspid valve regurgitation is moderate.  7. The aortic valve is normal in structure. Aortic valve regurgitation is not visualized. No aortic stenosis is present.  8. The inferior vena cava is dilated in size with <50% respiratory variability, suggesting right atrial pressure of 15 mmHg. Comparison(s): No significant change from prior study. Prior images reviewed side by side.  FINDINGS  Left Ventricle: Left ventricular ejection fraction, by estimation, is <20%. The left ventricle has severely decreased function. The left ventricle demonstrates global hypokinesis. Definity contrast

## 2021-09-05 NOTE — Evaluation (Signed)
Physical Therapy Evaluation ?Patient Details ?Name: [name] ?MRN: [id number] ?DOB: March 26, 1971 ?Today's Date: 09/05/2021 ? ?History of Present Illness ? [name] is a 51 y.o. 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  He was having increased worsening swelling of his extremities so he called his cardiologist and he was told to increase his evening dose of torsemide to 40 mg twice daily.  Normally he takes 40 mg of torsemide in the morning and 20 mg in the evening.  He denies any pleuritic chest pain, shortness of breath or chest pain but has not weighed himself and is unclear if he has had any weight gains.  He denies any sick contacts but he is concerned that he may have pneumonia given his cough.  He called his cardiologist Dr. Haroldine Laws but unfortunately [name] is on vacation in [place].  Chest x-ray done on admission and showed pulmonary vasculature congestion and markings suggestive of mild pulmonary edema.  Patient denies any nausea, vomiting, diarrhea, changes in bowel and thinks his leg swelling  is a little bit better with the increased dose of Lasix but states that his cough and congestion is bad.  TRH has been asked to admit this patient for his cough and acute on chronic systolic and diastolic CHF. ?  ?Clinical Impression ? Patient functioning at baseline for functional mobility and gait demonstrating good return for ambulation in room and hallways without loss of balance while on room air with SpO2 at 96%.  Plan:  Patient discharged from physical therapy to care of nursing for ambulation daily as tolerated for length of stay.  ?   ?   ? ?Recommendations for follow up therapy are one component of a multi-disciplinary discharge planning process, led by the attending physician.  Recommendations may be updated based on patient status, additional functional criteria and insurance authorization. ? ?Follow Up Recommendations No PT follow up ? ?  ?Assistance Recommended at Discharge None  ?Patient can return home with the following ? Other (comment) (patient at baseline) ? ?  ?Equipment Recommendations None recommended by PT  ?Recommendations for Other Services ?    ?  ?Functional Status Assessment    ? ?  ?Precautions / Restrictions Precautions ?Precautions: None ?Restrictions ?Weight Bearing Restrictions: No  ? ?  ? ?Mobility ? Bed Mobility ?Overal bed mobility: Independent ?  ?  ?  ?  ?  ?  ?  ?  ? ?Transfers ?Overall transfer level: Independent ?  ?  ?  ?  ?  ?  ?  ?  ?  ?  ? ?Ambulation/Gait ?  Ambulation/Gait assistance: Independent ?Gait Distance (Feet): 250 Feet ?Assistive device: None ?Gait Pattern/deviations: WFL(Within Functional Limits) ?Gait velocity: near normal ?  ?  ?General Gait Details: demonstrates good return for ambulation in room and hallways without loss of balance, on room air with SpO2 at 96% ? ?Stairs ?  ?  ?  ?  ?  ? ?Wheelchair Mobility ?  ? ?Modified Rankin (Stroke Patients Only) ?  ? ?  ? ?Balance Overall balance assessment: Independent ?  ?  ?  ?  ?  ?  ?  ?  ?  ?  ?  ?  ?  ?  ?  ?   ?  ?  ?   ? ? ? ?Pertinent Vitals/Pain Pain Assessment ?Pain Assessment: No/denies pain  ? ? ?Home Living Family/patient expects to be discharged to:: Private residence ?Living Arrangements: Children ?Available Help at Discharge: Family;Available PRN/intermittently ?Type of Home: House ?Home Access: Stairs to enter ?Entrance Stairs-Rails: Can reach both ?Entrance Stairs-Number of Steps: 1 ?  ?Home Layout: One level ?Home Equipment: None ?   ?  ?Prior Function Prior Level of Function : Independent/Modified Independent ?  ?  ?  ?  ?  ?  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 ?ADLs Comments: Independent; drives ?  ? ? ?Hand Dominance  ? Dominant Hand: Right ? ?  ?Extremity/Trunk Assessment  ? Upper Extremity Assessment ?Upper Extremity Assessment: Defer to OT evaluation ?  ? ?Lower Extremity Assessment ?Lower Extremity Assessment: Overall WFL for tasks assessed ?  ? ?Cervical / Trunk Assessment ?Cervical / Trunk Assessment: Normal  ?Communication  ? Communication: No difficulties  ?Cognition Arousal/Alertness: Awake/alert ?Behavior During Therapy: [facility] for tasks assessed/performed ?Overall Cognitive Status: Within Functional Limits for tasks assessed ?  ?  ?  ?  ?  ?  ?  ?  ?  ?  ?  ?  ?  ?  ?  ?  ?  ?  ?  ? ?  ?General Comments   ? ?  ?Exercises    ? ?Assessment/Plan  ?  ?PT Assessment Patient does not need any further PT services  ?PT Problem List   ? ?   ?  ?PT Treatment Interventions     ? ?PT Goals (Current goals can be found in the Care Plan section)  ?Acute Rehab PT Goals ?Patient Stated Goal: return home ?PT Goal Formulation: With patient ?Time For Goal Achievement: 09/05/21 ?Potential to Achieve Goals: Good ? ?  ?Frequency   ?  ? ? ?Co-evaluation PT/OT/SLP Co-Evaluation/Treatment: Yes ?Reason for Co-Treatment: To address functional/ADL transfers ?PT goals addressed during session: Mobility/safety with mobility;Balance ?OT goals addressed during session: ADL's and self-care ?  ? ? ?  ?AM-PAC PT "6  Clicks" Mobility  ?Outcome Measure Help needed turning from your back to your side while in a flat bed without using bedrails?: None ?Help needed moving from lying on your back to sitting on the side of a flat bed without using bedrails?: None ?Help needed moving to and from a bed to a chair (including a wheelchair)?: None ?Help needed standing up from a chair using your arms (e.g., wheelchair or bedside chair)?: None ?Help needed to walk in hospital room?: None ?Help needed climbing 3-5 steps with a railing? : None ?6 Click Score: 24 ? ?  ?End of Session   ?Activity Tolerance: Patient tolerated treatment well ?Patient left: in bed;with call bell/phone within reach ?Nurse Communication: Mobility status ?PT Visit Diagnosis: Unsteadiness on feet (R26.81);Other  abnormalities of gait and mobility (R26.89);Muscle weakness (generalized) (M62.81) ?  ? ?Time: 5947-0761 ?PT Time Calculation (min) (ACUTE ONLY): 20 min ? ? ?Charges:   PT Evaluation ?$PT Eval Moderate Complexity: 1 Mod ?PT Treatments ?$Therapeutic Activity: 8-22 mins ?  ?   ? ? ?11:43 AM, 09/05/21 ?Lonell Grandchild, MPT ?Physical Therapist with [place] ?[facility] ?[phone] office ?[phone] mobile phone ? ? ?

## 2021-09-05 NOTE — Discharge Summary (Addendum)
Physician Discharge Summary  ?[name] TKZ:[id number] DOB: 1970-12-23 DOA: 09/03/2021 ? ?PCP: Susy Frizzle, MD ? ?Admit date: 09/03/2021 ?Discharge date: 09/05/2021 ? ?Admitted From: Home ?Disposition:  Home ? ?Recommendations for Outpatient Follow-up:  ?Follow up with PCP in 1-2 weeks ?Follow up with Cardiology within 1-2 weeks ?Repeat CXR in 1 week  ?Please obtain CMP/CBC, Mag, Phos in one week ?Please follow up on the following pending results: ? ?Home Health: No  ?Equipment/Devices: None   ? ?Discharge Condition: Stable  ?CODE STATUS: FULL CODE ?Diet recommendation: Heart Healthy Diet  ? ?Brief/Interim Summary: ?[name] is a 51 y.o. 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  He was having increased worsening swelling of his extremities so he called his cardiologist and he was told to increase his evening dose of torsemide to 40 mg twice daily.  Normally he takes 40 mg of torsemide in the morning and 20 mg in the evening.  He denies any pleuritic chest pain, shortness of breath or chest pain but  has not weighed himself and is unclear if he has had any weight gains.  He denies any sick contacts but he is concerned that he may have pneumonia given his cough.  He called his cardiologist Dr. Haroldine Laws but unfortunately [name] is on a trip to [place].  Chest x-ray done on admission and showed pulmonary vasculature congestion and markings suggestive of mild pulmonary edema.  Patient denies any nausea, vomiting, diarrhea, changes in bowel and thinks his leg swelling is a little bit better with the increased dose of Lasix but states that his cough and congestion is bad.  TRH has been asked to admit this patient for his cough and acute on chronic systolic and diastolic CHF. ?  ?**Interim History ?Overnight the patient spiked a slight temperature and his BNP elevated.  Cardiology was consulted and repeat echo was done and showed no real change in potential EF less than 20% and grade 3 diastolic dysfunction.  Patient was diuresed and however became little hypotensive this morning so his spironolactone and Entresto were held.  Given his slight temperature and because his is now leukopenic we will empirically treat him for a pneumonia infection but likely this is viral.  ? ?Patient subsequently improved with diuresis and was weaned off of supplemental oxygen.  He did have a cough but thinks is not as bad.  Cardiology cleared the patient and resumed his home diuretics and he will follow-up with Dr. Haroldine Laws within the week.  He is stable for discharge PT OT recommend no follow-up.  He will need to  see his PCP within 1 week and also repeat his chest x-ray.  He was empirically placed on antibiotics for his cough but likely the cause of it is viral. ? ?Discharge Diagnoses:  ?Principal Problem: ?  Acute on chronic combined systolic and diastolic CHF (congestive heart failure) ([place]) ?Active Problems: ?  Nonischemic dilated cardiomyopathy ([place]) ?  Depression ?  Hyperlipidemia ?  Cirrhosis of liver ([place]) ?  GERD  (gastroesophageal reflux disease) ?  Cough ?  Hyponatremia ?  Hypokalemia ? ?* Acute on chronic combined systolic and diastolic CHF (congestive heart failure) ([place]) ?- Has been having a worsening cough so we will admit as an inpatient given that he has an elevated BNP of 2289.0 and clinical signs of volume overload especially on his chest x-ray and lower extremity swelling;  ?-Repeat BNP was >4,500.0 and contiuing IV Lasix  ?-Patient had orthopnea ?-Given IV Lasix 60 mg once in the ED and will resume 60 mg IV twice daily and hold his home torsemide now resume at 40 in the morning and 20 in the evening per cardiology recommendations ?-Cardiology was consulted for further evaluation and will repeat an echocardiogram which is essentially unchanged ?-Patient is -853 mL ?-Hold his GDMT with Spironolactone, and Entresto given his Hypotension ?-C/w Carvedilol 3.125 mg po BID ?-We will need strict I's and O's and daily weights ?-Continue monitor for signs symptoms of volume overload and repeat chest x-ray in the morning ?-Troponin slightly elevated at 34 and trended up to 35 and likely in the setting of demand ischemia from his volume overload ?-Repeat CXR yesterday AM showed "Mild cardiomegaly. Unchanged small right pleural effusion and/or pleural thickening. Unchanged mild, diffuse interstitial pulmonary opacity most consistent with edema. No new or focal airspace opacity." ?-Cardiology has cleared the patient for discharge and he will follow-up with his primary cardiologist Dr. Haroldine Laws this week ?  ?Hypokalemia ?- Patient's potassium was 3.1 this AM ?-Replete with p.o. potassium chloride 40 mg twice daily x2 doses and continue daily K+ Supplkement ?-Continue to monitor and replete as necessary ?-Repeat CMP within 1 week  ?  ?Hyponatremia ?-Patient's sodium is now 129 -> 132 -> 134 ?-Likely in the setting of volume overload ?-Expect to improve with diuresis ?-Continue to monitor and trend and repeat CMP within 1 week  ?   ?Cough ?Acute Respiratory Failure with Hypoxia, improved ?-He could have a viral illness or this could be secondary to his CHF ?-Patient was slightly febrile at 100.4 and his WBC is trended down to 3.5; empirically will treat him for a CAP  This is less likely and will continue antibiotics for 5 days with cefdinir and azithromycin ?-Initiate benzonatate ?-Ordered respiratory virus panel but was never collected but can get be done in outpatient setting ?-His COVID-19 and influenza A and influenza B were negative repeat chest x-ray in a.m. and continue monitor and trend carefully ?-Continue with supportive care ?-If necessary will provide breathing treatments ?-He became hypoxic the day before yesterday and is now on 2 L of supplemental oxygen but has been weaned off of it ?-Continuous Pulse Oximetry and Maintain O2 Sats >90% ?-Continue supplemental O2 via [place] and wean O2 as Tolerated ?-Repeat chest x-ray in outpatient setting ?  ?GERD (gastroesophageal reflux disease) ?- Does not appear to be on a PPI anymore but if necessary will provide him with PPI ?  ?Cirrhosis of liver ([place]) ?- In the history of alcoholism. ?-Currently getting diuresed with IV Lasix now ?-Check LFTs in the morning ?-  His T. bili was slightly elevated at 1.3 -> 1.4 and is improved to 0.9 will need to continue monitor and trend  ? ?Leukopenia ?-To be reactive or could due to viral upper respiratory infection ?-WBC went from 4.4 today 3.1 ?-Empirically treating for superimposed bacterial infection with cefdinir and azithromycin ?-Continue to monitor and trend and repeat CBC in outpatient setting ? ?Normocytic anemia ?-Patient's hemoglobin/hematocrit was 13.7/40.7 and trended down to 12.8/37.7 ?-Check anemia panel in outpatient setting ?-Continue to monitor for signs and symptoms of bleeding ?-No overt bleeding noted ? ?Hyperlipidemia ?- Does not appear to be taking statin anymore and will defer to cardiology for further evaluation and consideration  for repeat lipid panel while hospitalized ?  ?Depression ?- Has not active SI or HI and does not appear to be taking any medications by his MAR ?  ?Nonischemic dilated cardiomyopathy ([place]) ?-Has an EF of less than

## 2021-09-05 NOTE — Progress Notes (Signed)
Patient was discharged to home. Reviewed AVS and Heart Failure book. Patient will call and set up an appointment with his cardiologist. IV removed. Prescriptions sent to the patient's pharmacy.  ?

## 2021-09-06 ENCOUNTER — Telehealth (HOSPITAL_COMMUNITY): Payer: Self-pay | Admitting: *Deleted

## 2021-09-06 ENCOUNTER — Telehealth: Payer: Self-pay

## 2021-09-06 NOTE — Telephone Encounter (Signed)
Pt called to schedule hospital f/u. Pt was admitted on 3/26 for CHF. Pt scheduled with APP 4/6 but asked that I send a message to Dr.[name] to see if he can be worked in with him instead. Pt said Dr.[name] told him anytime he needed him he would work him in that day.  Pt aware that Dr.[name] is out of the country but asked that I still forward message.  ?

## 2021-09-06 NOTE — Telephone Encounter (Signed)
Transition Care Management Follow-up Telephone Call ?Date of discharge and from where: 09/05/21 [facility] ?How have you been since you were released from the hospital? Pt states he is doing well.  ?Any questions or concerns? No ? ?Items Reviewed: ?Did the pt receive and understand the discharge instructions provided? Yes  ?Medications obtained and verified? Yes  ?Other? No  ?Any new allergies since your discharge? No  ?Dietary orders reviewed? Yes ?Do you have support at home? Yes  ? ?Home Care and Equipment/Supplies: ?Were home health services ordered? N ?If so, what is the name of the agency? N/A  ?Has the agency set up a time to come to the patient's home? not applicable ?Were any new equipment or medical supplies ordered?  No ?What is the name of the medical supply agency? N/A ?Were you able to get the supplies/equipment? not applicable ?Do you have any questions related to the use of the equipment or supplies? No ? ?Functional Questionnaire: (I = Independent and D = Dependent) ?ADLs: I ? ?Bathing/Dressing- I ? ?Meal Prep- I ? ?Eating- I ? ?Maintaining continence- I ? ?Transferring/Ambulation- I ? ?Managing Meds- I ? ?Follow up appointments reviewed: ? ?PCP Hospital f/u appt confirmed? Yes  Scheduled to see Dr. Dennard Schaumann on 09/12/21 @ 2. ?[facility] f/u appt confirmed? Yes  Scheduled to see Dr. Haroldine Laws on 09/11/21 @ 3:20. ?Are transportation arrangements needed? No  ?If their condition worsens, is the pt aware to call PCP or go to the Emergency Dept.? Yes ?Was the patient provided with contact information for the PCP's office or ED? Yes ?Was to pt encouraged to call back with questions or concerns? Yes ? ?

## 2021-09-06 NOTE — Telephone Encounter (Signed)
Contacted pt appt scheduled with Dr.Bensimhom.  ?

## 2021-09-09 LAB — CULTURE, BLOOD (ROUTINE X 2)
Culture: NO GROWTH
Culture: NO GROWTH
Special Requests: ADEQUATE

## 2021-09-11 ENCOUNTER — Ambulatory Visit (HOSPITAL_COMMUNITY)
Admission: RE | Admit: 2021-09-11 | Discharge: 2021-09-11 | Disposition: A | Discharge status: Home / Self Care | Payer: Medicare HMO | Source: Ambulatory Visit | Attending: Internal Medicine | Admitting: Internal Medicine

## 2021-09-11 ENCOUNTER — Encounter (HOSPITAL_COMMUNITY): Payer: Self-pay | Admitting: Internal Medicine

## 2021-09-11 VITALS — BP 110/78 | HR 100 | Wt 147.0 lb

## 2021-09-11 DIAGNOSIS — Z95810 Presence of automatic (implantable) cardiac defibrillator: Secondary | ICD-10-CM | POA: Insufficient documentation

## 2021-09-11 DIAGNOSIS — I420 Dilated cardiomyopathy: Secondary | ICD-10-CM | POA: Diagnosis not present

## 2021-09-11 DIAGNOSIS — I5022 Chronic systolic (congestive) heart failure: Secondary | ICD-10-CM | POA: Diagnosis not present

## 2021-09-11 DIAGNOSIS — Z905 Acquired absence of kidney: Secondary | ICD-10-CM | POA: Insufficient documentation

## 2021-09-11 DIAGNOSIS — K7469 Other cirrhosis of liver: Secondary | ICD-10-CM

## 2021-09-11 DIAGNOSIS — Z7984 Long term (current) use of oral hypoglycemic drugs: Secondary | ICD-10-CM | POA: Insufficient documentation

## 2021-09-11 DIAGNOSIS — M419 Scoliosis, unspecified: Secondary | ICD-10-CM | POA: Insufficient documentation

## 2021-09-11 DIAGNOSIS — I071 Rheumatic tricuspid insufficiency: Secondary | ICD-10-CM

## 2021-09-11 DIAGNOSIS — Z923 Personal history of irradiation: Secondary | ICD-10-CM | POA: Insufficient documentation

## 2021-09-11 DIAGNOSIS — Z79899 Other long term (current) drug therapy: Secondary | ICD-10-CM | POA: Diagnosis not present

## 2021-09-11 DIAGNOSIS — K7460 Unspecified cirrhosis of liver: Secondary | ICD-10-CM | POA: Diagnosis not present

## 2021-09-11 LAB — COMPREHENSIVE METABOLIC PANEL
ALT: 23 U/L (ref 0–44)
AST: 44 U/L — ABNORMAL HIGH (ref 15–41)
Albumin: 3.2 g/dL — ABNORMAL LOW (ref 3.5–5.0)
Alkaline Phosphatase: 242 U/L — ABNORMAL HIGH (ref 38–126)
Anion gap: 11 (ref 5–15)
BUN: 9 mg/dL (ref 6–20)
CO2: 32 mmol/L (ref 22–32)
Calcium: 8.9 mg/dL (ref 8.9–10.3)
Chloride: 91 mmol/L — ABNORMAL LOW (ref 98–111)
Creatinine, Ser: 1.14 mg/dL (ref 0.61–1.24)
GFR, Estimated: >60 mL/min (ref >60)
Glucose, Bld: 137 mg/dL — ABNORMAL HIGH (ref 70–99)
Potassium: 2.9 mmol/L — ABNORMAL LOW (ref 3.5–5.1)
Sodium: 134 mmol/L — ABNORMAL LOW (ref 135–145)
Total Bilirubin: 1.1 mg/dL (ref 0.3–1.2)
Total Protein: 7.1 g/dL (ref 6.5–8.1)

## 2021-09-11 LAB — CBC
HCT: 41.5 % (ref 39.0–52.0)
Hemoglobin: 13.8 g/dL (ref 13.0–17.0)
MCH: 31.9 pg (ref 26.0–34.0)
MCHC: 33.3 g/dL (ref 30.0–36.0)
MCV: 95.8 fL (ref 80.0–100.0)
Platelets: 382 10*3/uL (ref 150–400)
RBC: 4.33 MIL/uL (ref 4.22–5.81)
RDW: 14.8 % (ref 11.5–15.5)
WBC: 4.5 10*3/uL (ref 4.0–10.5)
nRBC: 0 % (ref 0.0–0.2)

## 2021-09-11 LAB — BRAIN NATRIURETIC PEPTIDE: B Natriuretic Peptide: 3064.9 pg/mL — ABNORMAL HIGH (ref 0.0–100.0)

## 2021-09-11 MED ORDER — TORSEMIDE 20 MG PO TABS: 40.0000 mg | ORAL_TABLET | Freq: Every day | ORAL | 3 refills | Status: DC

## 2021-09-11 NOTE — Patient Instructions (Signed)
Labs done today, we will call you for abnormal results ? ?Your physician recommends that you schedule a follow-up appointment in: 2 months ? ?If you have any questions or concerns before your next appointment please send [place] a message through [place] or call our office at [phone].   ? ?TO LEAVE A MESSAGE FOR THE NURSE SELECT OPTION 2, PLEASE LEAVE A MESSAGE INCLUDING: ?YOUR NAME ?DATE OF BIRTH ?CALL BACK NUMBER ?REASON FOR CALL**this is important as we prioritize the call backs ? ?YOU WILL RECEIVE A CALL BACK THE SAME DAY AS LONG AS YOU CALL BEFORE 4:00 PM ? ?At the [facility], you and your health needs are our priority. As part of our continuing mission to provide you with exceptional heart care, we have created designated Provider Care Teams. These Care Teams include your primary Cardiologist (physician) and Advanced Practice Providers (APPs- Physician Assistants and Nurse Practitioners) who all work together to provide you with the care you need, when you need it.  ? ?You may see any of the following providers on your designated Care Team at your next follow up: ?Dr Glori Bickers ?Dr Loralie Champagne ?Darrick Grinder, NP ?Lyda Jester, PA ?Jessica Milford,NP ?Marlyce Huge, PA ?Audry Riles, PharmD ? ? ?Please be sure to bring in all your medications bottles to every appointment.  ? ? ?

## 2021-09-11 NOTE — Progress Notes (Signed)
?Patient ID: [name], male   DOB: [birth year], 51 y.o.   MRN: [id number] ? ? ?ADVANCED HF CLINIC NOTE ? ? ? Primary Cardiologist: Dr Domenic Polite ?HF MD: Dr Haroldine Laws ?[name]: Dr Mosetta Pigeon ? ?HPI: ? ?[name] is a 51 year old male with history of NICM (diagnosed 10/2010 - norm cors 2012 and 9/50), chronic systolic heart failure EF 15% (08/2013), S/P ICD [employer], smoker, Wilms tumor s/p nephrectomy at age 11 had chemoradiation, scoliosis.   ? ?We saw him at the end of March 2015 and had large recurrent R pleural effusion. Referred to Dr. Prescott Gum who placed Pleurex catheter on 4/10, removed on 09/28/13.  ? ?CPX (5/15) with RER 1.06, VO2 max 18.5, VE/VCO2 slope 32.1.  Moderate to severe functional limitation, circulatory.  ? ?CPX 09/17/16 ?Peak VO2: 20.5 (53% predicted peak VO2) Slope 35 ?Underwent L/RHC on 02/12/17. Had normal coronaries and relatively well-compensated hemodynamics. EF 20%. ? ?Echo 6/20 EF 15% RV mild HK with severe TR Personally reviewed ? ?CPX 8/20 with pVO2 17.1 (down from 20.5)  Slope 35 (stable).  ? ?CT abdomen 10/20 c/w cirrhosis. ? ?Paracentesis 03/02/19: 3.7 L off. No need for repeat paracenteses ? ?Has been evaluated at [facility] by Dr Mosetta Pigeon for possible transplant. Seen last in 4/21 - felt too early for transplant but getting closer. ? ?Echo 12/17/20: LVEF < 20% RV normal severe TR ? ?Admitted 3/26-28/23 due to volume overload, fever and cough. Purulent sinus drainage. Underwent IV diuresis.  But also received abx. Echo 09/04/21 unchanged. LVEF < 20% G3DD. Severe TR. Entresto and spiro stopped due to low BP ? ?Here for post-hospital f/u. Says he is feeling better but still having some wheezing. Feels like abx have helped a lot. Taking torsemide 40/20. LE edema has resolved. Says he feels like he had an infection and it kicked him over into HF.  ? ? ?CPX 12/17/20 ?FVC 2.41 (49%)      ? ?FEV1 1.51 (38%)        ? ?FEV1/FVC 63 (78%)        ? ?MVV 62 (40%)  ?     ?BP rest: 106/60 Standing BP: 100/62 BP peak:  104/62  ?Peak VO2: 18.3 (50% predicted peak VO2)  ?VE/VCO2 slope:  38  ?Peak RER: 1.10  ?Ventilatory Threshold: 15.6 (42% predicted or measured peak VO2)  ?VE/MVV:  84%  ? ? ? ?[place] 01/29/19  ? ?RA = 17 ?RV = 46/18 ?PA = 51/22 (33) ?PCW = 28 ?Fick cardiac output/index =4.0/2.2 ?PVR =1.0 WU ?Ao sat = 99% ?PA sat = 62%, 62% ?RA/PCWP = 0.61 ?PaPI = 1.71 ? ?CPX 01/15/19  ? ?FVC 2.85 (57%)      ?FEV1 2.04 (51%)        ?FEV1/FVC 72 (89%)        ?MVV 79 (50%)  ?    ?Resting HR: 96 Peak HR: 157   (90% age predicted max HR)  ?BP rest: 92/64 BP peak: 100/58  ?Peak VO2: 17.7 (47% predicted peak VO2)  ?VE/VCO2 slope:  35  ?OUES: 1.30  ?Peak RER: 1.18  ?Ventilatory Threshold: 14.0 (37% predicted or measured peak VO2 and 79% measured PVO2)  ?VE/MVV:  58%  ?O2pulse:  8   (57% predicted O2pulse)  ? ? ?ECHO 04/2014 EF 10% RV mildly dilated.   ?ECHO 2/18 EF 15% Mild MR RV mild HK. Severe TR. Triv MR . RV ok ?Echo 11/2016  EF 15% RV ok. Moderate to severe TR ? ?Cath 02/12/17: ? ?  Normal cors ? ?Ao = 91/60 (74) ?LV = 102/18 ?RA =  2 ?RV = 48/5 ?PA = 48/21 (31) ?PCW = 20 ?Fick cardiac output/index = 4.0/2.3 ?PVR = 2.8 WU ?Ao sat = 95% ?PA sat = 68%, 70% ?RA/PCWP = 0.10 ?PaPI = 13.5 ?  ?CPX 09/17/16 ?Resting HR: 86 Peak HR: 150   (85% age predicted max HR) ?BP rest: 96/80 BP peak: 118/72 ?Peak VO2: 20.5 (53% predicted peak VO2) ?Ve/VCO2 slope:  35 ?OUES: 1.38 ?Peak RER: 1.04 ?Ventilatory Threshold: 15.7 (41% predicted or measured peak VO2) ?Peak RR 32 ?Peak Ventilation:  47.5 ?VE/MVV:  50% ?PETCO2 at peak:  30 ?O2pulse:  8   (62% predicted O2pulse) ? ?CPX 5/15 ?Resting HR: 89 Peak HR: 138   (77% age predicted max HR) ?BP rest: 106/68 BP peak: 126/74 (IPE) ?Peak VO2: 18.5 (46.5% predicted peak VO2) ?VE/VCO2 slope:  32.1 ?OUES: 1.33 ?Peak RER: 1.06 ?Ventilatory Threshold: 13.5 (33.9% predicted peak VO2) ?Peak RR 31 ?Peak Ventilation:  37.1 ?VE/MVV:  62.7% ?PETCO2 at peak:  33 ?O2pulse:  7   (58% predicted O2pulse) ? ?SH: Lives with his son 78 year  old . Unemployed. Disability approved 3 years ago. Does not drink alcohol. Dips tobacco.  ? ?FH: Grandfather MI ? ?ROS: All systems negative except as listed in HPI, PMH and Problem List. ? ?Past Medical History:  ?Diagnosis Date  ? AICD (automatic cardioverter/defibrillator) present   ? Arthritis   ? Cardiomyopathy, nonischemic ([place])   ? takes Digoxin daily and Carvedilol daily  ? CHF (congestive heart failure) ([place])   ? takes Lasix daily as well Aldactone  ? Chronic back pain   ? Chronic systolic heart failure ([place])   ? Cirrhosis ([place])   ? Depression   ? takes Prozac daily  ? GERD (gastroesophageal reflux disease)   ? takes Omeprazole daily  ? Hx of Alcohol consumption heavy   ? rare beer currently  ? hx of Tobacco abuse   ? quit smoking 2014  ? Hyperlipidemia   ? was on Simvastatin but has been off a year  ? Insomnia   ? takes Ambien as needed(just got script yesterday)  ? Orthostatic hypotension   ? Scoliosis   ? Wilm's tumor   ? 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 51 (XRT and chemo)  ? ? ?Current Outpatient Medications  ?Medication Sig Dispense Refill  ? aspirin 81 MG tablet Take 81 mg by mouth daily.    ? benzonatate (TESSALON) 100 MG capsule Take 1 capsule (100 mg total) by mouth 3 (three) times daily as needed for cough. 20 capsule 0  ? Blood Glucose Monitoring Suppl (BLOOD GLUCOSE SYSTEM PAK) KIT Please dispense based on patient and insurance preference. Use to monitor FSBS 2-3x weekly. Dx: E11.9. 1 each 1  ? carvedilol (COREG) 3.125 MG tablet TAKE 1 TABLET BY MOUTH TWICE DAILY WITH A MEAL 180 tablet 0  ? docusate sodium (COLACE) 100 MG capsule Take 1 capsule (100 mg total) by mouth 2 (two) times daily. 10 capsule 0  ? ENTRESTO 49-51 MG TAKE 1 TABLET BY MOUTH TWICE DAILY . APPOINTMENT REQUIRED FOR FUTURE REFILLS 180 tablet 0  ? fluticasone (FLONASE) 50 MCG/ACT nasal spray Place 2 sprays into both nostrils daily. 16 g 6  ? Glucose Blood (BLOOD GLUCOSE TEST STRIPS) STRP Please dispense based on patient and insurance preference.  Use to monitor FSBS 2-3x weekly. Dx: E11.9. 50 each 1  ? [employer] MISC Please dispense based on patient and insurance preference. Use to  monitor FSBS 2-3x weekly. Dx: E11.9. 1 each 1  ? oxyCODONE (OXY IR/ROXICODONE) 5 MG immediate release tablet Take 1 tablet (5 mg total) by mouth 3 (three) times daily as needed for severe pain. 60 tablet 0  ? polyethylene glycol (MIRALAX / GLYCOLAX) 17 g packet Take 17 g by mouth daily as needed for mild constipation. 14 each 0  ? spironolactone (ALDACTONE) 25 MG tablet Take 1 tablet (25 mg total) by mouth daily. 90 tablet 3  ? torsemide (DEMADEX) 20 MG tablet Take 2 tablets (40 mg total) by mouth daily. Take extra tab as needed for swelling 135 tablet 3  ? ?No current facility-administered medications for this encounter.  ? ? ? ?PHYSICAL EXAM: ?Vitals:  ? 09/11/21 1514  ?BP: 110/78  ?Pulse: 100  ?SpO2: 97%  ?Weight: 66.7 kg (147 lb)  ? ?Wt Readings from Last 3 Encounters:  ?09/11/21 66.7 kg (147 lb)  ?09/05/21 67.4 kg (148 lb 9.4 oz)  ?08/01/21 68.7 kg (151 lb 6.4 oz)  ? ?  ?General:  Sitting on exam table No resp difficulty ?HEENT: normal ?Neck: supple. JVP to jaw with prominent v waves. Carotids 2+ bilat; no bruits. No lymphadenopathy or thryomegaly appreciated. ?Cor: PMI nondisplaced. Regular rate & rhythm. 26/ TR and MR  ?Lungs: coarse. Minimla wheeze ?Abdomen: soft, nontender, mildly distended. No hepatosplenomegaly. No bruits or masses. Good bowel sounds. ?Extremities: no cyanosis, clubbing, rash, edema ?Neuro: alert & orientedx3, cranial nerves grossly intact. moves all 4 extremities w/o difficulty. Affect pleasant ? ? ?ICD interrogation:  No VT/AF. Activity level stable ~2hr/day Personally reviewed ?  ? ?ASSESSMENT & PLAN: ? ?1. Chronic Systolic HF:  Nonischemic cardiomyopathy, ECHO 03/2014 EF 10%. Echo 2/18 EF 15% RV mild HK  [employer] ICD.   ?- Echo 6/20 EF 15% RV normal severe TR ?- Cath 9/18: normal cors. Relatively well-compensated hemodynmaics ?- W/u  has revealed evidence of cirrhosis which I suspect is due to a combination of ETOH and RV failure/severe TR. Synthetic function currently seems ok (Albumin 4.2  INR 1.3 on 9/20) ?- I doubt TV repair will b

## 2021-09-12 ENCOUNTER — Telehealth (HOSPITAL_COMMUNITY): Payer: Self-pay | Admitting: Surgery

## 2021-09-12 ENCOUNTER — Ambulatory Visit (INDEPENDENT_AMBULATORY_CARE_PROVIDER_SITE_OTHER): Payer: Medicare HMO | Admitting: Family Medicine

## 2021-09-12 VITALS — BP 84/52 | HR 91 | Temp 97.1°F | Ht 67.0 in | Wt 142.6 lb

## 2021-09-12 DIAGNOSIS — M109 Gout, unspecified: Secondary | ICD-10-CM | POA: Diagnosis not present

## 2021-09-12 DIAGNOSIS — I5022 Chronic systolic (congestive) heart failure: Secondary | ICD-10-CM

## 2021-09-12 DIAGNOSIS — R062 Wheezing: Secondary | ICD-10-CM | POA: Diagnosis not present

## 2021-09-12 DIAGNOSIS — E876 Hypokalemia: Secondary | ICD-10-CM

## 2021-09-12 DIAGNOSIS — I5043 Acute on chronic combined systolic (congestive) and diastolic (congestive) heart failure: Secondary | ICD-10-CM

## 2021-09-12 MED ORDER — PREDNISONE 20 MG PO TABS: ORAL_TABLET | ORAL | 0 refills | Status: DC

## 2021-09-12 MED ORDER — POTASSIUM CHLORIDE CRYS ER 20 MEQ PO TBCR: 40.0000 meq | EXTENDED_RELEASE_TABLET | Freq: Every day | ORAL | 6 refills | Status: DC

## 2021-09-12 NOTE — Telephone Encounter (Signed)
I contacted patient to review results and recommendations per Dr. Haroldine Laws.  He tells me that he visited PCP today who also reviewed results and recommendations.  I have updated [facility] medication list and patient will return to PCP to have repeat labwork on Monday morning. ?

## 2021-09-12 NOTE — Telephone Encounter (Signed)
-----   Message from Jolaine Artist, MD sent at 09/12/2021 11:54 AM EDT ----- ?K 2.9  Give 120 kcl today. Then start 40 daily. Recheck on Friday please. ?

## 2021-09-12 NOTE — Progress Notes (Signed)
? ?Subjective:  ? ? Patient ID: [name], male    DOB: Jun 23, 1970, 51 y.o.   MRN: [id number] ? ?HPI ?Admit date: 09/03/2021 ?Discharge date: 09/05/2021 ?  ? ?Brief/Interim Summary: ?[name] is a 51 y.o. 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  He was having increased worsening swelling of his extremities so he called his cardiologist and he was told to increase his evening dose of torsemide to 40 mg twice daily.  Normally he takes 40 mg of torsemide in the morning and 20 mg in the evening.  He denies any pleuritic chest pain, shortness of breath or chest pain but has not weighed himself and is unclear if he has had any weight gains.  He denies any sick contacts but he is concerned that he may have pneumonia given his cough.  He called his cardiologist Dr. Haroldine Laws but unfortunately [name] is on a trip to [place].  Chest x-ray done on admission and showed pulmonary vasculature congestion and markings suggestive of mild pulmonary edema.  Patient denies any nausea, vomiting, diarrhea, changes in bowel  and thinks his leg swelling is a little bit better with the increased dose of Lasix but states that his cough and congestion is bad.  [facility] has been asked to admit this patient for his cough and acute on chronic systolic and diastolic CHF. ?  ?**Interim History ?Overnight the patient spiked a slight temperature and his BNP elevated.  Cardiology was consulted and repeat echo was done and showed no real change in potential EF less than 20% and grade 3 diastolic dysfunction.  Patient was diuresed and however became little hypotensive this morning so his spironolactone and Entresto were held.  Given his slight temperature and because his is now leukopenic we will empirically treat him for a pneumonia infection but likely this is viral.  ?  ?Patient subsequently improved with diuresis and was weaned off of supplemental oxygen.  He did have a cough but thinks is not as bad.  Cardiology cleared the patient and resumed his home diuretics and he will follow-up with Dr. Haroldine Laws within the week.  He is stable for discharge PT OT recommend no follow-up.  He will need to see his PCP within 1 week and also repeat his chest x-ray.  He was empirically placed on antibiotics for his cough but likely the cause of it is viral. ?  ?Discharge Diagnoses:  ?Principal Problem: ?  Acute on chronic combined systolic and diastolic CHF (congestive heart failure) ([place]) ?Active Problems: ?  Nonischemic dilated cardiomyopathy ([place]) ?  Depression ?  Hyperlipidemia ?  Cirrhosis of liver ([place]) ?  GERD (gastroesophageal reflux disease) ?  Cough ?  Hyponatremia ?  Hypokalemia ?  ? ?09/12/21 ?Patient saw his cardiologist yesterday.  His potassium was checked and was low at 2.9.  His cardiologist recommended 120 mill equivalents of potassium today and 40 mill equivalents every day until his potassium was rechecked on Friday.  Patient has not received this message yet.  So I conveyed the message to him and he states that he will follow his cardiologist  recommendations regarding his potassium.  Cardiology saw him yesterday and temporarily increased his dose of his fluid pill.  Today on his pulmonary exam, the patient states he feels much better and his breathing is closer back to his baseline however he still has bibasilar Rales that are prominent both left and right lung.  He is also audibly wheezing in both lungs.  He denies any history of emphysema or asthma however his lungs certainly sound like he is having bronchospasm.  He does report some shortness of breath and his pulse oximetry is 92%.  He is also complaining of pain in his right first MTP joint.  He suspects that he may have gout.  The MTP joint is swollen and tender and painful.  I suspect that he may have gout exacerbation due to aggressive diuresis. ?Past Medical History:  ?Diagnosis Date  ? AICD (automatic cardioverter/defibrillator) present   ? Arthritis   ? Cardiomyopathy, nonischemic ([place])   ? takes Digoxin daily and Carvedilol daily  ? CHF (congestive heart failure) ([place])   ? takes Lasix daily as well Aldactone  ? Chronic back pain   ? Chronic systolic heart failure ([place])   ? Cirrhosis ([place])   ? Depression   ? takes Prozac daily  ? GERD (gastroesophageal reflux disease)   ? takes Omeprazole daily  ? Hx of Alcohol consumption heavy   ? rare beer currently  ? hx of Tobacco abuse   ? quit smoking 2014  ? Hyperlipidemia   ? was on Simvastatin but has been off a year  ? Insomnia   ? takes Ambien as needed(just got script yesterday)  ? Orthostatic hypotension   ? Scoliosis   ? Wilm's tumor   ? 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 58 (XRT and chemo)  ? ?Past Surgical History:  ?Procedure Laterality Date  ? CARPAL TUNNEL RELEASE Right 01/09/2013  ? Procedure: CARPAL TUNNEL RELEASE;  Surgeon: Winfield Cunas, MD;  Location: [facility];  Service: Neurosurgery;  Laterality: Right;  RIGHT carpal tunnel release  ? CARPAL TUNNEL RELEASE Left 01/30/2013  ? Procedure: LEFT CARPAL TUNNEL RELEASE;  Surgeon: Winfield Cunas, MD;   Location: [facility];  Service: Neurosurgery;  Laterality: Left;  LEFT Carpal Tunnel release  ? CHEST TUBE INSERTION Right 09/09/2013  ? Procedure: INSERTION PLEURAL DRAINAGE CATHETER;  Surgeon: Ivin Poot, MD;  Location: [facility];  Service: Thoracic;  Laterality: Right;  ? HYDROCELE EXCISION Right 04/11/2017  ? Procedure: RIGHT HYDROCELECTOMY ADULT;  Surgeon: Irine Seal, MD;  Location: [facility];  Service: Urology;  Laterality: Right;  ? hydrocelectomy  2008  ? ICD  05/25/2011  ? [employer] SG lead/[employer] single chamber device  ? IMPLANTABLE CARDIOVERTER DEFIBRILLATOR IMPLANT N/A 05/25/2011  ? Procedure: IMPLANTABLE CARDIOVERTER DEFIBRILLATOR IMPLANT;  Surgeon: Evans Lance, MD;  Location: [facility];  Service: Cardiovascular;  Laterality: N/A;  ? NEPHRECTOMY  36 yrs ago  ? Wilms Tumor  ? RIGHT HEART CATH N/A 01/29/2019  ?  Procedure: RIGHT HEART CATH;  Surgeon: Jolaine Artist, MD;  Location: [facility] [place];  Service: Cardiovascular;  Laterality: N/A;  ? RIGHT HEART CATH N/A 09/08/2019  ? Procedure: RIGHT HEART CATH;  Surgeon: Jolaine Artist, MD;  Location: [facility];  Service: Cardiovascular;  Laterality: N/A;  ? RIGHT/LEFT HEART CATH AND CORONARY ANGIOGRAPHY N/A 02/12/2017  ? Procedure: RIGHT/LEFT HEART CATH AND CORONARY ANGIOGRAPHY;  Surgeon: Jolaine Artist, MD;  Location: [facility];  Service: Cardiovascular;  Laterality: N/A;  ? ULNAR NERVE TRANSPOSITION Right 01/09/2013  ? Procedure: ULNAR NERVE DECOMPRESSION/TRANSPOSITION;  Surgeon: Winfield Cunas, MD;  Location: [facility];  Service: Neurosurgery;  Laterality: Right;  RIGHT ulnar nerve decompression  ? ?Current Outpatient Medications on File Prior to Visit  ?Medication Sig Dispense Refill  ? aspirin 81 MG tablet Take 81 mg by mouth daily.    ? benzonatate (TESSALON) 100 MG capsule Take 1 capsule (100 mg total) by mouth 3 (three) times daily as needed for cough. 20 capsule 0  ? Blood Glucose  Monitoring Suppl (BLOOD GLUCOSE SYSTEM PAK) KIT Please dispense based on patient and insurance preference. Use to monitor FSBS 2-3x weekly. Dx: E11.9. 1 each 1  ? carvedilol (COREG) 3.125 MG tablet TAKE 1 TABLET BY MOUTH

## 2021-09-13 ENCOUNTER — Other Ambulatory Visit (HOSPITAL_COMMUNITY): Payer: Self-pay | Admitting: Internal Medicine

## 2021-09-14 ENCOUNTER — Encounter (HOSPITAL_COMMUNITY): Payer: Medicare HMO

## 2021-09-18 ENCOUNTER — Ambulatory Visit (INDEPENDENT_AMBULATORY_CARE_PROVIDER_SITE_OTHER): Payer: Medicare HMO | Admitting: Family Medicine

## 2021-09-18 VITALS — BP 112/70 | HR 96 | Temp 96.1°F | Ht 67.0 in | Wt 146.8 lb

## 2021-09-18 DIAGNOSIS — E876 Hypokalemia: Secondary | ICD-10-CM | POA: Diagnosis not present

## 2021-09-18 DIAGNOSIS — I5022 Chronic systolic (congestive) heart failure: Secondary | ICD-10-CM | POA: Diagnosis not present

## 2021-09-18 LAB — BASIC METABOLIC PANEL WITH GFR
BUN: 24 mg/dL (ref 7–25)
CO2: 36 mmol/L — ABNORMAL HIGH (ref 20–32)
Calcium: 10.2 mg/dL (ref 8.6–10.3)
Chloride: 88 mmol/L — ABNORMAL LOW (ref 98–110)
Creat: 1.04 mg/dL (ref 0.60–1.29)
Glucose, Bld: 61 mg/dL — ABNORMAL LOW (ref 65–99)
Potassium: 4.3 mmol/L (ref 3.5–5.3)
Sodium: 133 mmol/L — ABNORMAL LOW (ref 135–146)
eGFR: 89 mL/min/{1.73_m2} (ref >=60)

## 2021-09-18 NOTE — Progress Notes (Signed)
? ?Subjective:  ? ? Patient ID: [name], male    DOB: Jun 23, 1970, 51 y.o.   MRN: [id number] ? ?HPI ?Admit date: 09/03/2021 ?Discharge date: 09/05/2021 ?  ? ?Brief/Interim Summary: ?[name] is a 51 y.o. 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  He was having increased worsening swelling of his extremities so he called his cardiologist and he was told to increase his evening dose of torsemide to 40 mg twice daily.  Normally he takes 40 mg of torsemide in the morning and 20 mg in the evening.  He denies any pleuritic chest pain, shortness of breath or chest pain but has not weighed himself and is unclear if he has had any weight gains.  He denies any sick contacts but he is concerned that he may have pneumonia given his cough.  He called his cardiologist Dr. Haroldine Laws but unfortunately [name] is on a trip to [place].  Chest x-ray done on admission and showed pulmonary vasculature congestion and markings suggestive of mild pulmonary edema.  Patient denies any nausea, vomiting, diarrhea, changes in bowel  and thinks his leg swelling is a little bit better with the increased dose of Lasix but states that his cough and congestion is bad.  [facility] has been asked to admit this patient for his cough and acute on chronic systolic and diastolic CHF. ?  ?**Interim History ?Overnight the patient spiked a slight temperature and his BNP elevated.  Cardiology was consulted and repeat echo was done and showed no real change in potential EF less than 20% and grade 3 diastolic dysfunction.  Patient was diuresed and however became little hypotensive this morning so his spironolactone and Entresto were held.  Given his slight temperature and because his is now leukopenic we will empirically treat him for a pneumonia infection but likely this is viral.  ?  ?Patient subsequently improved with diuresis and was weaned off of supplemental oxygen.  He did have a cough but thinks is not as bad.  Cardiology cleared the patient and resumed his home diuretics and he will follow-up with Dr. Haroldine Laws within the week.  He is stable for discharge PT OT recommend no follow-up.  He will need to see his PCP within 1 week and also repeat his chest x-ray.  He was empirically placed on antibiotics for his cough but likely the cause of it is viral. ?  ?Discharge Diagnoses:  ?Principal Problem: ?  Acute on chronic combined systolic and diastolic CHF (congestive heart failure) ([place]) ?Active Problems: ?  Nonischemic dilated cardiomyopathy ([place]) ?  Depression ?  Hyperlipidemia ?  Cirrhosis of liver ([place]) ?  GERD (gastroesophageal reflux disease) ?  Cough ?  Hyponatremia ?  Hypokalemia ?  ? ?09/12/21 ?Patient saw his cardiologist yesterday.  His potassium was checked and was low at 2.9.  His cardiologist recommended 120 mill equivalents of potassium today and 40 mill equivalents every day until his potassium was rechecked on Friday.  Patient has not received this message yet.  So I conveyed the message to him and he states that he will follow his cardiologist  recommendations regarding his potassium.  Cardiology saw him yesterday and temporarily increased his dose of his fluid pill.  Today on his pulmonary exam, the patient states he feels much better and his breathing is closer back to his baseline however he still has bibasilar Rales that are prominent both left and right lung.  He is also audibly wheezing in both lungs.  He denies any history of emphysema or asthma however his lungs certainly sound like he is having bronchospasm.  He does report some shortness of breath and his pulse oximetry is 92%.  He is also complaining of pain in his right first MTP joint.  He suspects that he may have gout.  The MTP joint is swollen and tender and painful.  I suspect that he may have gout exacerbation due to aggressive diuresis.  At that time, my plan was: ?Patient is wheezing significantly on his exam.  I believe his recent viral infection likely triggered his CHF exacerbation and is also triggered bronchospasm.  There may be an element of underlying emphysema.  I will treat the patient with a prednisone taper pack.  I believe that this will also help a gout exacerbation in his right first MTP joint.  I will see the patient back next week to recheck his breathing.  Have asked him to take 120 mill equivalents of potassium today and then 40 mill equivalents potassium daily until his potassium is rechecked on Friday.  This is the instructions given by his cardiologist and I conveyed that to the patient.  He states that he will come by here and we can certainly check his potassium level and I can give that information to his cardiologist as it would be more convenient for the patient.  I still feel like he has pulmonary edema on his exam.  Patient states that his cardiologist her that yesterday and that they have temporarily increased his diuretic so I will defer this to their management.  His blood pressure is very low but he is asymptomatic.  Therefore I will not make any  adjustments in his antihypertensive medications.  Recheck Monday or sooner if worse.   He is overdue for hemoglobin A1c.  He has decided not to take [place] due to rash and irritation in his groin.  I will recheck his A1c we will check blood work Friday/Monday ? ?09/18/21 ?Patient has gained 4 pounds since I last saw him.  However there is no pitting edema in his legs.  Thankfully his lungs today are completely clear.  The wheezing that he was experiencing last week has totally resolved on the prednisone.  He states that he feels much better after having taken the prednisone and his breathing is back to his baseline.  He attributes the 4 pounds in weight gain to eating over the Easter holiday.  He denies any chest pain or shortness of breath.  The gout exacerbation in his foot has resolved on the prednisone as well.  He is due today to  recheck his potassium.  He is currently taking 40 mg of his diuretic twice a day.  Prior to his hospitalization he was taking 20 mg twice a day and he was on no potassium supplement ?Past Medical History:  ?Diagnosis Date  ? AICD (automatic cardioverter/defibrillator) present   ? Arthritis   ? Cardiomyopathy, nonischemic ([place])   ? takes Digoxin daily and Carvedilol daily  ? CHF (congestive heart failure) ([place])   ? takes Lasix daily as well Aldactone  ? Chronic back pain   ? Chronic systolic heart failure ([place])   ? Cirrhosis ([place])   ? Depression   ? takes Prozac daily  ? GERD (gastroesophageal reflux disease)   ? takes Omeprazole daily  ? Hx of Alcohol consumption heavy   ? rare beer currently  ? hx of Tobacco abuse   ? quit smoking 2014  ? Hyperlipidemia   ? was on Simvastatin but has been off a year  ? Insomnia   ? takes Ambien as needed(just got script yesterday)  ? Orthostatic hypotension   ? Scoliosis   ? Wilm's tumor   ? 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 10 (XRT and chemo)  ? ?Past Surgical History:  ?Procedure Laterality Date  ? CARPAL TUNNEL RELEASE Right 01/09/2013  ? Procedure: CARPAL TUNNEL  RELEASE;  Surgeon: Winfield Cunas, MD;  Location: [facility];  Service: Neurosurgery;  Laterality: Right;  RIGHT carpal tunnel release  ? CARPAL TUNNEL RELEASE Left 01/30/2013  ? Procedure: LEFT CARPAL TUNNEL RELEASE

## 2021-09-25 ENCOUNTER — Telehealth: Payer: Self-pay

## 2021-09-25 NOTE — Telephone Encounter (Signed)
Pt called stated that he was in the hospital a few weeks ago. He had some fluid drain out from his foot and now he believes that it has cause gout on his toe.  ? ?Would like to see if you can send in a RX for him? ? ?Pls advise ?

## 2021-09-26 ENCOUNTER — Other Ambulatory Visit: Payer: Self-pay | Admitting: Family Medicine

## 2021-09-26 MED ORDER — COLCHICINE 0.6 MG PO TABS: ORAL_TABLET | ORAL | 1 refills | Status: DC

## 2021-09-27 NOTE — Telephone Encounter (Signed)
Rx sent per Dr. Dennard Schaumann, per pt picked meds.  ?

## 2021-10-06 ENCOUNTER — Other Ambulatory Visit: Payer: Self-pay

## 2021-10-06 MED ORDER — OXYCODONE HCL 5 MG PO TABS
5.0000 mg | ORAL_TABLET | Freq: Three times a day (TID) | ORAL | 0 refills | Status: DC | PRN
Start: 2021-10-06 — End: 2021-11-02

## 2021-10-06 NOTE — Telephone Encounter (Signed)
LOV 05/30/21 ?Last refill 08/29/21, #60, 0 refills ?  ?Please review, thanks!  ?

## 2021-10-11 DIAGNOSIS — I5023 Acute on chronic systolic (congestive) heart failure: Secondary | ICD-10-CM

## 2021-11-02 ENCOUNTER — Other Ambulatory Visit: Payer: Self-pay | Admitting: Family Medicine

## 2021-11-02 ENCOUNTER — Telehealth: Payer: Self-pay

## 2021-11-02 MED ORDER — OXYCODONE HCL 5 MG PO TABS: 5.0000 mg | ORAL_TABLET | Freq: Three times a day (TID) | ORAL | 0 refills | Status: DC | PRN

## 2021-11-02 NOTE — Telephone Encounter (Signed)
LOV 09/18/21 Last refill 10/06/21, #60, 0 refills  Please review, thanks!

## 2021-11-09 ENCOUNTER — Ambulatory Visit (INDEPENDENT_AMBULATORY_CARE_PROVIDER_SITE_OTHER): Payer: Medicare HMO

## 2021-11-09 DIAGNOSIS — I420 Dilated cardiomyopathy: Secondary | ICD-10-CM

## 2021-11-10 NOTE — Progress Notes (Signed)
Patient ID: [name], male   DOB: 1971/02/23, 51 y.o.   MRN: [id number]   ADVANCED HF CLINIC NOTE   Primary Cardiologist: Dr Domenic Polite HF Cardiologist: Dr Haroldine Laws [name]: Dr Mosetta Pigeon  HPI:  [name] is a 51 y.o. male with history of NICM (diagnosed 10/2010 - norm cors 2012 and 4/48), chronic systolic heart failure EF 15% (08/2013), S/P ICD [employer], smoker, Wilms tumor s/p nephrectomy at age 17 had chemoradiation, scoliosis.    We saw him at the end of March 2015 and had large recurrent R pleural effusion. Referred to Dr. Prescott Gum who placed Pleurex catheter on 4/10, removed on 09/28/13.   CPX (5/15) with RER 1.06, VO2 max 18.5, VE/VCO2 slope 32.1.  Moderate to severe functional limitation, circulatory.   CPX 09/17/16 Peak VO2: 20.5 (53% predicted peak VO2) Slope 35 Underwent L/RHC on 02/12/17. Had normal coronaries and relatively well-compensated hemodynamics. EF 20%.  Echo 6/20 EF 15% RV mild HK with severe TR Personally reviewed  CPX 8/20 with pVO2 17.1 (down from 20.5)  Slope 35 (stable).   CT abdomen 10/20 c/w cirrhosis.  Paracentesis 03/02/19: 3.7 L off. No need for repeat paracenteses  Has been evaluated at [facility] by Dr Mosetta Pigeon for possible transplant. Seen last in 4/21 - felt too early for transplant but getting closer.  Echo 12/17/20: LVEF < 20% RV normal severe TR  Admitted 3/26-28/23 due to volume overload, fever and cough. Purulent sinus drainage. Underwent IV diuresis.  But also received abx. Echo 09/04/21 unchanged. LVEF < 20% G3DD. Severe TR. Entresto and spiro stopped due to low BP  Post hospital follow up 4/23, NYHA II-III, concern for slow deterioration and referred back to [facility].  Today he returns for HF follow up. Overall feeling fine. He has mild SOB walking up inclines. He has LE swelling that resolves in the mornings, has not taken extra torsemide. Denies palpitations, CP, dizziness, or PND/Orthopnea. Appetite ok. No fever or chills. Weight at home 150 pounds.  Taking all medications. Has not heard back from [place].  Cardiac Studies  - CPX 12/17/20 FVC 2.41 (49%)       FEV1 1.51 (38%)         FEV1/FVC 63 (78%)         MVV 62 (40%)       BP rest: 106/60 Standing BP: 100/62 BP peak: 104/62  Peak VO2: 18.3 (50% predicted peak VO2)  VE/VCO2 slope:  38  Peak RER: 1.10  Ventilatory Threshold: 15.6 (42% predicted or measured peak VO2)  VE/MVV:  84%   - RHC 8/20   RA = 17 RV = 46/18 PA = 51/22 (33) PCW = 28 Fick cardiac output/index =4.0/2.2 PVR =1.0 WU Ao sat = 99% PA sat = 62%, 62% RA/PCWP = 0.61 PaPI = 1.71  - CPX 01/15/19   FVC 2.85 (57%)      FEV1 2.04 (51%)        FEV1/FVC 72 (89%)        MVV 79 (50%)      Resting HR: 96 Peak HR: 157   (90% age predicted max HR)  BP rest: 92/64 BP peak: 100/58  Peak VO2: 17.7 (47% predicted peak VO2)  VE/VCO2 slope:  35  OUES: 1.30  Peak RER: 1.18  Ventilatory Threshold: 14.0 (37% predicted or measured peak VO2 and 79% measured PVO2)  VE/MVV:  58%  O2pulse:  8   (57% predicted O2pulse)    - Echo (11/15): EF 10% RV mildly dilated.   -  Echo (2/18): EF 15% Mild MR RV mild HK. Severe TR. Triv MR . RV ok - Echo (6/18): EF 15% RV ok. Moderate to severe TR  - Cath (9/18): Normal cors  Ao = 91/60 (74) LV = 102/18 RA =  2 RV = 48/5 PA = 48/21 (31) PCW = 20 Fick cardiac output/index = 4.0/2.3 PVR = 2.8 WU Ao sat = 95% PA sat = 68%, 70% RA/PCWP = 0.10 PaPI = 13.5   - CPX (09/17/16) Resting HR: 86 Peak HR: 150   (85% age predicted max HR) BP rest: 96/80 BP peak: 118/72 Peak VO2: 20.5 (53% predicted peak VO2) Ve/VCO2 slope:  35 OUES: 1.38 Peak RER: 1.04 Ventilatory Threshold: 15.7 (41% predicted or measured peak VO2) Peak RR 32 Peak Ventilation:  47.5 VE/MVV:  50% PETCO2 at peak:  30 O2pulse:  8   (62% predicted O2pulse)  - CPX (5/15) Resting HR: 89 Peak HR: 138   (77% age predicted max HR) BP rest: 106/68 BP peak: 126/74 (IPE) Peak VO2: 18.5 (46.5% predicted peak  VO2) VE/VCO2 slope:  32.1 OUES: 1.33 Peak RER: 1.06 Ventilatory Threshold: 13.5 (33.9% predicted peak VO2) Peak RR 31 Peak Ventilation:  37.1 VE/MVV:  62.7% PETCO2 at peak:  33 O2pulse:  7   (58% predicted O2pulse)  SH: Lives with his son 22 year old . Unemployed. Disability approved 3 years ago. Does not drink alcohol. Dips tobacco.   FH: Grandfather MI  ROS: All systems negative except as listed in HPI, PMH and Problem List.  Past Medical History:  Diagnosis Date   AICD (automatic cardioverter/defibrillator) present    Arthritis    Cardiomyopathy, nonischemic ([place])    takes Digoxin daily and Carvedilol daily   CHF (congestive heart failure) (HCC)    takes Lasix daily as well Aldactone   Chronic back pain    Chronic systolic heart failure (HCC)    Cirrhosis ([place])    Depression    takes Prozac daily   GERD (gastroesophageal reflux disease)    takes Omeprazole daily   Hx of Alcohol consumption heavy    rare beer currently   hx of Tobacco abuse    quit smoking 2014   Hyperlipidemia    was on Simvastatin but has been off a year   Insomnia    takes Ambien as needed(just got script yesterday)   Orthostatic hypotension    Scoliosis    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 36 (XRT and chemo)    Current Outpatient Medications  Medication Sig Dispense Refill   aspirin 81 MG tablet Take 81 mg by mouth daily.     benzonatate (TESSALON) 100 MG capsule Take 1 capsule (100 mg total) by mouth 3 (three) times daily as needed for cough. 20 capsule 0   carvedilol (COREG) 3.125 MG tablet TAKE 1 TABLET BY MOUTH TWICE DAILY WITH A MEAL 180 tablet 0   colchicine 0.6 MG tablet 2 tabs immediately and 1 addition tab in 1 hour per day until pain subsides. (Patient taking differently: 2 tabs immediately and 1 addition tab in 1 hour per day until pain subsides. As needed) 30 tablet 1   docusate sodium (COLACE) 100 MG capsule Take 1 capsule (100 mg total) by mouth 2 (two) times daily. (Patient taking  differently: Take 100 mg by mouth 2 (two) times daily as needed.) 10 capsule 0   ENTRESTO 49-51 MG TAKE 1 TABLET BY MOUTH TWICE DAILY, APPOINTMENT REQUIRED FOR FUTURE REFILLS 180 tablet 0  fluticasone (FLONASE) 50 MCG/ACT nasal spray Place 2 sprays into both nostrils daily. 16 g 6   oxyCODONE (OXY IR/ROXICODONE) 5 MG immediate release tablet Take 1 tablet (5 mg total) by mouth 3 (three) times daily as needed for severe pain. 60 tablet 0   polyethylene glycol (MIRALAX / GLYCOLAX) 17 g packet Take 17 g by mouth daily as needed for mild constipation. 14 each 0   spironolactone (ALDACTONE) 25 MG tablet Take 1 tablet (25 mg total) by mouth daily. 90 tablet 3   torsemide (DEMADEX) 20 MG tablet Patient takes 2 tablets by mouth in the morning and 1 tablet every evening.     Blood Glucose Monitoring Suppl (BLOOD GLUCOSE SYSTEM PAK) KIT Please dispense based on patient and insurance preference. Use to monitor FSBS 2-3x weekly. Dx: E11.9. (Patient not taking: Reported on 11/13/2021) 1 each 1   Glucose Blood (BLOOD GLUCOSE TEST STRIPS) STRP Please dispense based on patient and insurance preference. Use to monitor FSBS 2-3x weekly. Dx: E11.9. (Patient not taking: Reported on 11/13/2021) 50 each 1   Lancet Devices MISC Please dispense based on patient and insurance preference. Use to monitor FSBS 2-3x weekly. Dx: E11.9. (Patient not taking: Reported on 11/13/2021) 1 each 1   No current facility-administered medications for this encounter.   BP 102/80   Pulse 93   Wt 70.5 kg (155 lb 6.4 oz)   SpO2 98%   BMI 24.34 kg/m   Wt Readings from Last 3 Encounters:  11/13/21 70.5 kg (155 lb 6.4 oz)  09/18/21 66.6 kg (146 lb 12.8 oz)  09/12/21 64.7 kg (142 lb 9.6 oz)   PHYSICAL EXAM: General:  NAD. No resp difficulty HEENT: Normal Neck: Supple. + v waves, JVP 6-7. Carotids 2+ bilat; no bruits. No lymphadenopathy or thryomegaly appreciated. Cor: PMI nondisplaced. Regular rate & rhythm. No rubs, gallops, 2/6 MR and 2/6  TR Lungs: Clear Abdomen: Soft, nontender, nondistended. No hepatosplenomegaly. No bruits or masses. Good bowel sounds. Extremities: No cyanosis, clubbing, rash, 2+ BLE edema R>L Neuro: Alert & oriented x 3, cranial nerves grossly intact. Moves all 4 extremities w/o difficulty. Affect pleasant.  ICD interrogation:  No VT/AF. 1.2 hrs/day activity (Personally reviewed)   REDs:  44%  ASSESSMENT & PLAN: 1. Chronic Systolic HF:   - Nonischemic cardiomyopathy,  - Echo 03/2014 EF 10%.  - Echo 2/18 EF 15% RV mild HK  [employer] ICD.   - Echo 6/20 EF 15% RV normal severe TR - Cath 9/18: normal cors. Relatively well-compensated hemodynmaics - W/u has revealed evidence of cirrhosis which I suspect is due to a combination of ETOH and RV failure/severe TR. Synthetic function currently seems ok (Albumin 4.2  INR 1.3 on 9/20) - I doubt TV repair will be sufficient for him so question will be if he will still be a transplant candidate. GI has seen and feels liver disease likely not severe enough to be a barrier to transplant.  May need [facility] team to weigh in as well. - He saw Dr. Mosetta Pigeon in 4/21 who felt we would need to proceed with advanced therapies sooner than later in his disease given cirrhosis but still felt he was a bit early. Recommended close f/u with CPX. - Admitted (3/23) with ADHF in setting of URI - Echo (3/23): EF < 20% G3DD. Severe TR  Overall Dr. Haroldine Laws concerned that he is deteriorating slowly and missing his transplant window. He has been referred back to [facility], I will check the status of  this referral.  - NYHA II-early III, he is volume overloaded on exam, weight up 11 lbs. ReDs 44%. - Increase torsemide to 60 mg bid x 3 days with extra 20 KCL x 3 days, then back to torsemide 40/20. Discussed taking extra as needed for home weight > 150 lbs. - Continue Entresto 49/51 mg bid. Unable to tolerate 97/103 due to low BP. - Continue carvedilol 3.125 mg bid (failed titration of  6.25/3.125). - Continue spironolactone 25 mg daily.  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 - Unable to tolerate digoxin due to heartburn. - Blood type A-pos. - BMET/BNP today, repeat BMET in 2 weeks. - Place compression hose. - Repeat CPX in 1-2 weeks, after diuresis.  2. Severe TR - Echo with moderate to severe TR but TV seems structurally intact. ? If related to ICD wire or just dilated annulus.  - Doubt TV repair will be sufficient for him, so question will be if he will still be a transplant candidate. - No change.  3. Cirrhosis - w/u as above - appreciate GI input - due to RV failure.  - Stable. No ascites on exam   Follow up in 2 months with Dr. Haroldine Laws.   Rafael Bihari, FNP  9:14 AM

## 2021-11-13 ENCOUNTER — Ambulatory Visit (HOSPITAL_COMMUNITY)
Admission: RE | Admit: 2021-11-13 | Discharge: 2021-11-13 | Disposition: A | Discharge status: Home / Self Care | Payer: Medicare HMO | Source: Ambulatory Visit | Attending: Family Medicine | Admitting: Family Medicine

## 2021-11-13 ENCOUNTER — Encounter (HOSPITAL_COMMUNITY): Payer: Self-pay

## 2021-11-13 ENCOUNTER — Telehealth (HOSPITAL_COMMUNITY): Payer: Self-pay | Admitting: Cardiology

## 2021-11-13 VITALS — BP 102/80 | HR 93 | Wt 155.4 lb

## 2021-11-13 DIAGNOSIS — K7469 Other cirrhosis of liver: Secondary | ICD-10-CM

## 2021-11-13 DIAGNOSIS — I428 Other cardiomyopathies: Secondary | ICD-10-CM | POA: Insufficient documentation

## 2021-11-13 DIAGNOSIS — K7460 Unspecified cirrhosis of liver: Secondary | ICD-10-CM | POA: Diagnosis not present

## 2021-11-13 DIAGNOSIS — Z85528 Personal history of other malignant neoplasm of kidney: Secondary | ICD-10-CM | POA: Insufficient documentation

## 2021-11-13 DIAGNOSIS — Z923 Personal history of irradiation: Secondary | ICD-10-CM | POA: Diagnosis not present

## 2021-11-13 DIAGNOSIS — I5022 Chronic systolic (congestive) heart failure: Secondary | ICD-10-CM | POA: Diagnosis not present

## 2021-11-13 DIAGNOSIS — Z79899 Other long term (current) drug therapy: Secondary | ICD-10-CM | POA: Diagnosis not present

## 2021-11-13 DIAGNOSIS — I071 Rheumatic tricuspid insufficiency: Secondary | ICD-10-CM

## 2021-11-13 DIAGNOSIS — M419 Scoliosis, unspecified: Secondary | ICD-10-CM | POA: Insufficient documentation

## 2021-11-13 DIAGNOSIS — Z905 Acquired absence of kidney: Secondary | ICD-10-CM | POA: Diagnosis not present

## 2021-11-13 DIAGNOSIS — I959 Hypotension, unspecified: Secondary | ICD-10-CM | POA: Diagnosis not present

## 2021-11-13 DIAGNOSIS — Z95810 Presence of automatic (implantable) cardiac defibrillator: Secondary | ICD-10-CM | POA: Insufficient documentation

## 2021-11-13 DIAGNOSIS — J069 Acute upper respiratory infection, unspecified: Secondary | ICD-10-CM | POA: Insufficient documentation

## 2021-11-13 LAB — BASIC METABOLIC PANEL
Anion gap: 8 (ref 5–15)
BUN: 13 mg/dL (ref 6–20)
CO2: 34 mmol/L — ABNORMAL HIGH (ref 22–32)
Calcium: 8.9 mg/dL (ref 8.9–10.3)
Chloride: 91 mmol/L — ABNORMAL LOW (ref 98–111)
Creatinine, Ser: 1.09 mg/dL (ref 0.61–1.24)
GFR, Estimated: >60 mL/min (ref >60)
Glucose, Bld: 151 mg/dL — ABNORMAL HIGH (ref 70–99)
Potassium: 3 mmol/L — ABNORMAL LOW (ref 3.5–5.1)
Sodium: 133 mmol/L — ABNORMAL LOW (ref 135–145)

## 2021-11-13 LAB — BRAIN NATRIURETIC PEPTIDE: B Natriuretic Peptide: 3027.1 pg/mL — ABNORMAL HIGH (ref 0.0–100.0)

## 2021-11-13 MED ORDER — POTASSIUM CHLORIDE CRYS ER 20 MEQ PO TBCR: EXTENDED_RELEASE_TABLET | ORAL | 6 refills | Status: DC

## 2021-11-13 MED ORDER — TORSEMIDE 20 MG PO TABS: ORAL_TABLET | ORAL | 3 refills | Status: DC

## 2021-11-13 NOTE — Patient Instructions (Addendum)
Thank you for coming in today  Labs were done today, if any labs are abnormal the clinic will call you No news is good news  Your physician recommends that you schedule a follow-up appointment in:  2-3 months with Dr. Haroldine Laws  Your physician recommends that you return for lab work in: 2 weeks   You have a order for compression hose please wear them daily.  INCREASE Torsemide to 60 mg twice daily for 3 days after those 3 days please go to  40 mg in the morning and 20 mg in the evening PLEASE take a extra dose of 20 meq    At the [facility], you and your health needs are our priority. As part of our continuing mission to provide you with exceptional heart care, we have created designated Provider Care Teams. These Care Teams include your primary Cardiologist (physician) and Advanced Practice Providers (APPs- Physician Assistants and Nurse Practitioners) who all work together to provide you with the care you need, when you need it.   You may see any of the following providers on your designated Care Team at your next follow up: Dr Glori Bickers Dr Haynes Kerns, NP Lyda Jester, [place] [facility] [place], [place] Audry Riles, PharmD   Please be sure to bring in all your medications bottles to every appointment.   If you have any questions or concerns before your next appointment please send [place] a message through [place] or call our office at [phone].    TO LEAVE A MESSAGE FOR THE NURSE SELECT OPTION 2, PLEASE LEAVE A MESSAGE INCLUDING: YOUR NAME DATE OF BIRTH CALL BACK NUMBER REASON FOR CALL**this is important as we prioritize the call backs  YOU WILL RECEIVE A CALL BACK THE SAME DAY AS LONG AS YOU CALL BEFORE 4:00 PM

## 2021-11-13 NOTE — Telephone Encounter (Signed)
Patient called.  Patient aware.  

## 2021-11-13 NOTE — Progress Notes (Signed)
ReDS Vest / Clip - 11/13/21 0900       ReDS Vest / Clip   Station Marker C    Ruler Value 26.5    ReDS Value Range High volume overload    ReDS Actual Value 44

## 2021-11-13 NOTE — Telephone Encounter (Signed)
-----   Message from Rafael Bihari, [place] sent at 11/13/2021 10:56 AM EDT ----- K is very low.  Take 120 mEq KCL today x 1, then start 40 daily. Repeat BMET on Friday

## 2021-11-14 ENCOUNTER — Other Ambulatory Visit (HOSPITAL_COMMUNITY): Payer: Self-pay

## 2021-11-14 DIAGNOSIS — I5022 Chronic systolic (congestive) heart failure: Secondary | ICD-10-CM

## 2021-11-14 LAB — CUP PACEART REMOTE DEVICE CHECK
Battery Remaining Longevity: 42 mo
Battery Remaining Percentage: 42 %
Brady Statistic RV Percent Paced: 0 %
Date Time Interrogation Session: 20230606032100
HighPow Impedance: 74 Ohm
Implantable Lead Implant Date: 20121214
Implantable Lead Location: 753860
Implantable Lead Model: 180
Implantable Lead Serial Number: 307189
Implantable Pulse Generator Implant Date: 20121214
Lead Channel Impedance Value: 523 Ohm
Lead Channel Pacing Threshold Amplitude: 0.7 V
Lead Channel Pacing Threshold Pulse Width: 0.4 ms
Lead Channel Setting Pacing Amplitude: 2.4 V
Lead Channel Setting Pacing Pulse Width: 0.4 ms
Lead Channel Setting Sensing Sensitivity: 0.4 mV
Pulse Gen Serial Number: 118994

## 2021-11-17 ENCOUNTER — Ambulatory Visit (HOSPITAL_COMMUNITY)
Admission: RE | Admit: 2021-11-17 | Discharge: 2021-11-17 | Disposition: A | Discharge status: Home / Self Care | Payer: Medicare HMO | Source: Ambulatory Visit | Attending: Cardiology | Admitting: Cardiology

## 2021-11-17 ENCOUNTER — Other Ambulatory Visit: Payer: Self-pay | Admitting: Cardiology

## 2021-11-17 DIAGNOSIS — I5022 Chronic systolic (congestive) heart failure: Secondary | ICD-10-CM | POA: Diagnosis not present

## 2021-11-17 LAB — BASIC METABOLIC PANEL
Anion gap: 9 (ref 5–15)
BUN: 15 mg/dL (ref 6–20)
CO2: 30 mmol/L (ref 22–32)
Calcium: 8.9 mg/dL (ref 8.9–10.3)
Chloride: 96 mmol/L — ABNORMAL LOW (ref 98–111)
Creatinine, Ser: 1.26 mg/dL — ABNORMAL HIGH (ref 0.61–1.24)
GFR, Estimated: >60 mL/min (ref >60)
Glucose, Bld: 127 mg/dL — ABNORMAL HIGH (ref 70–99)
Potassium: 3.9 mmol/L (ref 3.5–5.1)
Sodium: 135 mmol/L (ref 135–145)

## 2021-11-17 NOTE — Progress Notes (Signed)
Remote ICD transmission.   

## 2021-11-27 ENCOUNTER — Other Ambulatory Visit (HOSPITAL_COMMUNITY): Payer: Medicare HMO

## 2021-11-28 DIAGNOSIS — J209 Acute bronchitis, unspecified: Secondary | ICD-10-CM | POA: Diagnosis not present

## 2021-12-05 ENCOUNTER — Other Ambulatory Visit: Payer: Self-pay | Admitting: Family Medicine

## 2021-12-05 ENCOUNTER — Telehealth: Payer: Self-pay

## 2021-12-05 MED ORDER — OXYCODONE HCL 5 MG PO TABS: 5.0000 mg | ORAL_TABLET | Freq: Three times a day (TID) | ORAL | 0 refills | Status: DC | PRN

## 2021-12-05 NOTE — Telephone Encounter (Signed)
LOV 09/18/21 Last refill 11/02/21, #60, 0 refills  Please review, thanks!

## 2021-12-07 ENCOUNTER — Encounter (HOSPITAL_COMMUNITY): Payer: Medicare HMO

## 2021-12-20 ENCOUNTER — Ambulatory Visit (INDEPENDENT_AMBULATORY_CARE_PROVIDER_SITE_OTHER): Payer: Medicare HMO | Admitting: Internal Medicine

## 2021-12-20 ENCOUNTER — Encounter: Payer: Self-pay | Admitting: Internal Medicine

## 2021-12-20 VITALS — BP 115/72 | HR 96 | Ht 67.0 in | Wt 153.6 lb

## 2021-12-20 DIAGNOSIS — I420 Dilated cardiomyopathy: Secondary | ICD-10-CM

## 2021-12-20 NOTE — Progress Notes (Signed)
HPI [name] returns today for folllowup. He is a pleasant 52 yo man with a h/o chronic systolic heart failure, s/p ICD insertion, cirrhosis, TR and on maximal medical therapy. He has class 2 symptoms. He has not had chest pain or edema. He has not had any more ICD therapies. He had been in the hospital several months ago with CHF.  Allergies  Allergen Reactions   Ibuprofen Hives   Nsaids     ELEVATED LFT'S     Current Outpatient Medications  Medication Sig Dispense Refill   aspirin 81 MG tablet Take 81 mg by mouth daily.     benzonatate (TESSALON) 100 MG capsule Take 1 capsule (100 mg total) by mouth 3 (three) times daily as needed for cough. 20 capsule 0   Blood Glucose Monitoring Suppl (BLOOD GLUCOSE SYSTEM PAK) KIT Please dispense based on patient and insurance preference. Use to monitor FSBS 2-3x weekly. Dx: E11.9. 1 each 1   carvedilol (COREG) 3.125 MG tablet TAKE 1 TABLET BY MOUTH TWICE DAILY WITH A MEAL 180 tablet 0   colchicine 0.6 MG tablet 2 tabs immediately and 1 addition tab in 1 hour per day until pain subsides. (Patient taking differently: 2 tabs immediately and 1 addition tab in 1 hour per day until pain subsides. As needed) 30 tablet 1   docusate sodium (COLACE) 100 MG capsule Take 1 capsule (100 mg total) by mouth 2 (two) times daily. (Patient taking differently: Take 100 mg by mouth 2 (two) times daily as needed.) 10 capsule 0   ENTRESTO 49-51 MG TAKE 1 TABLET BY MOUTH TWICE DAILY, APPOINTMENT REQUIRED FOR FUTURE REFILLS 180 tablet 0   fluticasone (FLONASE) 50 MCG/ACT nasal spray Place 2 sprays into both nostrils daily. 16 g 6   Glucose Blood (BLOOD GLUCOSE TEST STRIPS) STRP Please dispense based on patient and insurance preference. Use to monitor FSBS 2-3x weekly. Dx: E11.9. 50 each 1   Lancet Devices MISC Please dispense based on patient and insurance preference. Use to monitor FSBS 2-3x weekly. Dx: E11.9. 1 each 1   oxyCODONE (OXY IR/ROXICODONE) 5 MG immediate  release tablet Take 1 tablet (5 mg total) by mouth 3 (three) times daily as needed for severe pain. 60 tablet 0   polyethylene glycol (MIRALAX / GLYCOLAX) 17 g packet Take 17 g by mouth daily as needed for mild constipation. 14 each 0   spironolactone (ALDACTONE) 25 MG tablet Take 1 tablet (25 mg total) by mouth daily. 90 tablet 3   torsemide (DEMADEX) 20 MG tablet Take 2 tablets (40 mg total) by mouth in the morning AND 1 tablet (20 mg total) every evening. Patient takes 2 tablets by mouth in the morning and 1 tablet every evening.. 150 tablet 3   potassium chloride SA (KLOR-CON M) 20 MEQ tablet Take 6 tablets (120 mEq total) by mouth daily for 1 day, THEN 2 tablets (40 mEq total) daily. 66 tablet 6   No current facility-administered medications for this visit.     Past Medical History:  Diagnosis Date   AICD (automatic cardioverter/defibrillator) present    Arthritis    Cardiomyopathy, nonischemic (HCC)    takes Digoxin daily and Carvedilol daily   CHF (congestive heart failure) (HCC)    takes Lasix daily as well Aldactone   Chronic back pain    Chronic systolic heart failure ([place])    Cirrhosis ([place])    Depression    takes Prozac daily   GERD (gastroesophageal reflux  disease)    takes Omeprazole daily   Hx of Alcohol consumption heavy    rare beer currently   hx of Tobacco abuse    quit smoking 2014   Hyperlipidemia    was on Simvastatin but has been off a year   Insomnia    takes Ambien as needed(just got script yesterday)   Orthostatic hypotension    Scoliosis    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 32 (XRT and chemo)    ROS:   All systems reviewed and negative except as noted in the HPI.   Past Surgical History:  Procedure Laterality Date   CARPAL TUNNEL RELEASE Right 01/09/2013   Procedure: CARPAL TUNNEL RELEASE;  Surgeon: Winfield Cunas, MD;  Location: [place] NEURO ORS;  Service: Neurosurgery;  Laterality: Right;  RIGHT carpal tunnel release   CARPAL TUNNEL RELEASE Left  01/30/2013   Procedure: LEFT CARPAL TUNNEL RELEASE;  Surgeon: Winfield Cunas, MD;  Location: [place] NEURO ORS;  Service: Neurosurgery;  Laterality: Left;  LEFT Carpal Tunnel release   CHEST TUBE INSERTION Right 09/09/2013   Procedure: INSERTION PLEURAL DRAINAGE CATHETER;  Surgeon: Ivin Poot, MD;  Location: [place];  Service: Thoracic;  Laterality: Right;   HYDROCELE EXCISION Right 04/11/2017   Procedure: RIGHT HYDROCELECTOMY ADULT;  Surgeon: Irine Seal, MD;  Location: [facility] ORS;  Service: Urology;  Laterality: Right;   hydrocelectomy  2008   ICD  05/25/2011   [facility] Scientific [employer] SG lead/[employer] single chamber device   IMPLANTABLE CARDIOVERTER DEFIBRILLATOR IMPLANT N/A 05/25/2011   Procedure: IMPLANTABLE CARDIOVERTER DEFIBRILLATOR IMPLANT;  Surgeon: Evans Lance, MD;  [facility];  Service: Cardiovascular;  Laterality: N/A;   NEPHRECTOMY  36 yrs ago   Wilms Tumor   RIGHT HEART CATH N/A 01/29/2019   Procedure: RIGHT HEART CATH;  Surgeon: Jolaine Artist, MD;  Location: [place] CV LAB;  Service: Cardiovascular;  Laterality: N/A;   RIGHT HEART CATH N/A 09/08/2019   Procedure: RIGHT HEART CATH;  Surgeon: Jolaine Artist, MD;  Location: [place] CV LAB;  Service: Cardiovascular;  Laterality: N/A;   RIGHT/LEFT HEART CATH AND CORONARY ANGIOGRAPHY N/A 02/12/2017   Procedure: RIGHT/LEFT HEART CATH AND CORONARY ANGIOGRAPHY;  Surgeon: Jolaine Artist, MD;  Location: [place] CV LAB;  Service: Cardiovascular;  Laterality: N/A;   ULNAR NERVE TRANSPOSITION Right 01/09/2013   Procedure: ULNAR NERVE DECOMPRESSION/TRANSPOSITION;  Surgeon: Winfield Cunas, MD;  Location: [place] NEURO ORS;  Service: Neurosurgery;  Laterality: Right;  RIGHT ulnar nerve decompression     Family History  Problem Relation Age of Onset   Diabetes Mother      Social History   Socioeconomic History   Marital status: Single    Spouse name: Not on file   Number of children: 1   Years of  education: Not on file   Highest education level: Not on file  Occupational History   Occupation: Full time    Comment: Engineer, civil (consulting)  Tobacco Use   Smoking status: Former    Packs/day: 0.25    Years: 20.00    Total pack years: 5.00    Types: Cigarettes    Quit date: 09/07/2012    Years since quitting: 9.2   Smokeless tobacco: Current    Types: Snuff  Vaping Use   Vaping Use: Never used  Substance and Sexual Activity   Alcohol use: Yes    Alcohol/week: 0.0 standard drinks of alcohol    Comment: histoorically a heavy  drinker ("beer only"), none for a couple months    Drug use: No   Sexual activity: Not Currently  Other Topics Concern   Not on file  Social History Narrative   Not on file   Social Determinants of Health   Financial Resource Strain: Low Risk  (07/07/2021)   Overall Financial Resource Strain (CARDIA)    Difficulty of Paying Living Expenses: Not hard at all  Food Insecurity: No Food Insecurity (07/07/2021)   Hunger Vital Sign    Worried About Running Out of Food in the Last Year: Never true    Ran Out of Food in the Last Year: Never true  Transportation Needs: No Transportation Needs (07/07/2021)   PRAPARE - Hydrologist (Medical): No    Lack of Transportation (Non-Medical): No  Physical Activity: Insufficiently Active (07/07/2021)   Exercise Vital Sign    Days of Exercise per Week: 4 days    Minutes of Exercise per Session: 30 min  Stress: No Stress Concern Present (07/07/2021)   [place]    Feeling of Stress : Only a little  Social Connections: Moderately Integrated (07/07/2021)   Social Connection and Isolation Panel [NHANES]    Frequency of Communication with Friends and Family: More than three times a week    Frequency of Social Gatherings with Friends and Family: More than three times a week    Attends Religious Services: 1 to 4 times per year    Active  Member of [employer] or Organizations: Yes    Attends Archivist Meetings: 1 to 4 times per year    Marital Status: Never married  Intimate Partner Violence: Not At Risk (07/07/2021)   Humiliation, Afraid, Rape, and Kick questionnaire    Fear of Current or Ex-Partner: No    Emotionally Abused: No    Physically Abused: No    Sexually Abused: No     BP 115/72   Pulse 96   Ht 5' 7" (1.702 m)   Wt 153 lb 9.6 oz (69.7 kg)   SpO2 98%   BMI 24.06 kg/m   Physical Exam:  Well appearing NAD HEENT: Unremarkable Neck:  No JVD, no thyromegally Lymphatics:  No adenopathy Back:  No CVA tenderness Lungs:  Clear with no wheezes HEART:  Regular rate rhythm, no murmurs, no rubs, no clicks Abd:  soft, positive bowel sounds, no organomegally, no rebound, no guarding Ext:  2 plus pulses, no edema, no cyanosis, no clubbing Skin:  No rashes no nodules Neuro:  CN II through XII intact, motor grossly intact   DEVICE  Normal device function.  See PaceArt for details.   Assess/Plan:  1. Chronic systolic heart failure - he has class 2 symptoms and is tolerating maximal medical therapy. He will continue his current meds and maintain a low sodium diet. 2. TR - I cannot hear his TR on exam and he does not have a RV lift or heave. He does have MR and an S3.  3. Cirrhosis - he is well compensated and has mild peripheral edema and minimal abdominal swelling.  4. VT - he has not had any recent episodes. We will follow.  [name] ,MD

## 2021-12-20 NOTE — Patient Instructions (Signed)
Medication Instructions:  Your physician recommends that you continue on your current medications as directed. Please refer to the Current Medication list given to you today.  *If you need a refill on your cardiac medications before your next appointment, please call your pharmacy*   Lab Work: NONE   If you have labs (blood work) drawn today and your tests are completely normal, you will receive your results only by: MyChart Message (if you have MyChart) OR A paper copy in the mail If you have any lab test that is abnormal or we need to change your treatment, we will call you to review the results.   Testing/Procedures: NONE    Follow-Up: At [facility], you and your health needs are our priority.  As part of our continuing mission to provide you with exceptional heart care, we have created designated Provider Care Teams.  These Care Teams include your primary Cardiologist (physician) and Advanced Practice Providers (APPs -  Physician Assistants and Nurse Practitioners) who all work together to provide you with the care you need, when you need it.  We recommend signing up for the patient portal called "MyChart".  Sign up information is provided on this After Visit Summary.  MyChart is used to connect with patients for Virtual Visits (Telemedicine).  Patients are able to view lab/test results, encounter notes, upcoming appointments, etc.  Non-urgent messages can be sent to your provider as well.   To learn more about what you can do with MyChart, go to [email or web address].    Your next appointment:   1 year(s)  The format for your next appointment:   In Person  Provider:   Gregg Taylor, MD    Other Instructions Thank you for choosing [facility]!    Important Information About Sugar       

## 2021-12-21 ENCOUNTER — Ambulatory Visit (HOSPITAL_COMMUNITY): Payer: Medicare HMO | Attending: Cardiology

## 2021-12-21 ENCOUNTER — Other Ambulatory Visit (HOSPITAL_COMMUNITY): Payer: Self-pay | Admitting: *Deleted

## 2021-12-21 DIAGNOSIS — F32A Depression, unspecified: Secondary | ICD-10-CM | POA: Diagnosis not present

## 2021-12-21 DIAGNOSIS — G4700 Insomnia, unspecified: Secondary | ICD-10-CM | POA: Diagnosis not present

## 2021-12-21 DIAGNOSIS — K219 Gastro-esophageal reflux disease without esophagitis: Secondary | ICD-10-CM | POA: Diagnosis not present

## 2021-12-21 DIAGNOSIS — I509 Heart failure, unspecified: Secondary | ICD-10-CM | POA: Diagnosis not present

## 2021-12-21 DIAGNOSIS — R0600 Dyspnea, unspecified: Secondary | ICD-10-CM | POA: Diagnosis not present

## 2021-12-21 DIAGNOSIS — I5022 Chronic systolic (congestive) heart failure: Secondary | ICD-10-CM

## 2021-12-21 DIAGNOSIS — G8929 Other chronic pain: Secondary | ICD-10-CM | POA: Insufficient documentation

## 2021-12-21 DIAGNOSIS — M1990 Unspecified osteoarthritis, unspecified site: Secondary | ICD-10-CM | POA: Insufficient documentation

## 2021-12-21 DIAGNOSIS — M549 Dorsalgia, unspecified: Secondary | ICD-10-CM | POA: Insufficient documentation

## 2021-12-21 DIAGNOSIS — K7460 Unspecified cirrhosis of liver: Secondary | ICD-10-CM | POA: Insufficient documentation

## 2021-12-21 DIAGNOSIS — E785 Hyperlipidemia, unspecified: Secondary | ICD-10-CM | POA: Diagnosis not present

## 2021-12-21 DIAGNOSIS — M419 Scoliosis, unspecified: Secondary | ICD-10-CM | POA: Insufficient documentation

## 2021-12-21 DIAGNOSIS — I428 Other cardiomyopathies: Secondary | ICD-10-CM | POA: Diagnosis not present

## 2021-12-21 DIAGNOSIS — R69 Illness, unspecified: Secondary | ICD-10-CM | POA: Diagnosis not present

## 2022-01-02 ENCOUNTER — Other Ambulatory Visit: Payer: Self-pay

## 2022-01-02 MED ORDER — OXYCODONE HCL 5 MG PO TABS: 5.0000 mg | ORAL_TABLET | Freq: Three times a day (TID) | ORAL | 0 refills | Status: DC | PRN

## 2022-01-02 NOTE — Telephone Encounter (Signed)
LOV 10/01/21 Last refill 12/05/21, #60, 0 refills  Please review, thanks!

## 2022-01-22 ENCOUNTER — Other Ambulatory Visit (HOSPITAL_COMMUNITY): Payer: Self-pay

## 2022-01-22 MED ORDER — CARVEDILOL 3.125 MG PO TABS: 3.1250 mg | ORAL_TABLET | Freq: Two times a day (BID) | ORAL | 0 refills | Status: DC

## 2022-02-02 ENCOUNTER — Other Ambulatory Visit: Payer: Self-pay

## 2022-02-02 MED ORDER — OXYCODONE HCL 5 MG PO TABS: 5.0000 mg | ORAL_TABLET | Freq: Three times a day (TID) | ORAL | 0 refills | Status: DC | PRN

## 2022-02-02 NOTE — Telephone Encounter (Signed)
LOV 09/12/21 Last refill 01/02/22, #60, 0 refills  Please review, thanks!

## 2022-02-08 ENCOUNTER — Ambulatory Visit (INDEPENDENT_AMBULATORY_CARE_PROVIDER_SITE_OTHER): Payer: Medicare HMO

## 2022-02-08 DIAGNOSIS — I420 Dilated cardiomyopathy: Secondary | ICD-10-CM

## 2022-02-13 LAB — CUP PACEART REMOTE DEVICE CHECK
Battery Remaining Longevity: 36 mo
Battery Remaining Percentage: 37 %
Brady Statistic RV Percent Paced: 0 %
Date Time Interrogation Session: 20230901152700
HighPow Impedance: 76 Ohm
Implantable Lead Implant Date: 20121214
Implantable Lead Location: 753860
Implantable Lead Model: 180
Implantable Lead Serial Number: 307189
Implantable Pulse Generator Implant Date: 20121214
Lead Channel Impedance Value: 554 Ohm
Lead Channel Pacing Threshold Amplitude: 0.7 V
Lead Channel Pacing Threshold Pulse Width: 0.4 ms
Lead Channel Setting Pacing Amplitude: 2.4 V
Lead Channel Setting Pacing Pulse Width: 0.4 ms
Lead Channel Setting Sensing Sensitivity: 0.4 mV
Pulse Gen Serial Number: 118994

## 2022-02-22 ENCOUNTER — Encounter (HOSPITAL_COMMUNITY): Payer: Self-pay | Admitting: Internal Medicine

## 2022-02-22 ENCOUNTER — Ambulatory Visit (HOSPITAL_COMMUNITY)
Admission: RE | Admit: 2022-02-22 | Discharge: 2022-02-22 | Disposition: A | Discharge status: Home / Self Care | Payer: Medicare HMO | Source: Ambulatory Visit | Attending: Internal Medicine | Admitting: Internal Medicine

## 2022-02-22 VITALS — BP 108/80 | HR 109 | Wt 163.4 lb

## 2022-02-22 DIAGNOSIS — Z95810 Presence of automatic (implantable) cardiac defibrillator: Secondary | ICD-10-CM | POA: Diagnosis not present

## 2022-02-22 DIAGNOSIS — M419 Scoliosis, unspecified: Secondary | ICD-10-CM | POA: Insufficient documentation

## 2022-02-22 DIAGNOSIS — Z85528 Personal history of other malignant neoplasm of kidney: Secondary | ICD-10-CM | POA: Diagnosis not present

## 2022-02-22 DIAGNOSIS — Z923 Personal history of irradiation: Secondary | ICD-10-CM | POA: Diagnosis not present

## 2022-02-22 DIAGNOSIS — Z905 Acquired absence of kidney: Secondary | ICD-10-CM | POA: Diagnosis not present

## 2022-02-22 DIAGNOSIS — I5023 Acute on chronic systolic (congestive) heart failure: Secondary | ICD-10-CM | POA: Diagnosis not present

## 2022-02-22 DIAGNOSIS — I428 Other cardiomyopathies: Secondary | ICD-10-CM | POA: Diagnosis not present

## 2022-02-22 DIAGNOSIS — Z79899 Other long term (current) drug therapy: Secondary | ICD-10-CM | POA: Diagnosis not present

## 2022-02-22 DIAGNOSIS — I5022 Chronic systolic (congestive) heart failure: Secondary | ICD-10-CM | POA: Diagnosis not present

## 2022-02-22 DIAGNOSIS — I071 Rheumatic tricuspid insufficiency: Secondary | ICD-10-CM

## 2022-02-22 LAB — BASIC METABOLIC PANEL
Anion gap: 6 (ref 5–15)
BUN: 12 mg/dL (ref 6–20)
CO2: 31 mmol/L (ref 22–32)
Calcium: 8.5 mg/dL — ABNORMAL LOW (ref 8.9–10.3)
Chloride: 93 mmol/L — ABNORMAL LOW (ref 98–111)
Creatinine, Ser: 1.11 mg/dL (ref 0.61–1.24)
GFR, Estimated: >60 mL/min (ref >60)
Glucose, Bld: 145 mg/dL — ABNORMAL HIGH (ref 70–99)
Potassium: 3.9 mmol/L (ref 3.5–5.1)
Sodium: 130 mmol/L — ABNORMAL LOW (ref 135–145)

## 2022-02-22 LAB — BRAIN NATRIURETIC PEPTIDE: B Natriuretic Peptide: 3112.5 pg/mL — ABNORMAL HIGH (ref 0.0–100.0)

## 2022-02-22 LAB — MAGNESIUM: Magnesium: 2 mg/dL (ref 1.7–2.4)

## 2022-02-22 MED ORDER — FUROSCIX 80 MG/10ML ~~LOC~~ CTKT: 80.0000 mg | CARTRIDGE | SUBCUTANEOUS | Status: DC

## 2022-02-22 NOTE — Progress Notes (Signed)
ReDS Vest / Clip - 02/22/22 1000       ReDS Vest / Clip   Station Marker D    Ruler Value 28    ReDS Value Range High volume overload    ReDS Actual Value 46

## 2022-02-22 NOTE — Progress Notes (Signed)
Provided patient education on Furoscix using demo kits and Furoscix video, QR code provided on AVS for further viewing. Furoscix order form completed and signed by Dr Haroldine Laws. Order form, ins info, & OV note all faxed into [facility] [facility].

## 2022-02-22 NOTE — Patient Instructions (Addendum)
Your provider has order Furoscix for you. This is an on-body infuser that gives you a dose of Furosemide.   It will be shipped to your home   [facility] will call you to discuss before shipping so, PLEASE answer unknown calls  For questions regarding the device call [facility] at [phone]  Ensure you write down the time you start your infusion so that if there is a problem you will know how long the infusion lasted  Use Furoscix only AS DIRECTED by our office  Dosing Directions:   Day 1= Today, 02/22/22, Use 1 kit with extra 20 meq of Potassium  Day 2= Fri 9/15, Use 1 kit in AM and 1 kit in PM, take an extra 20 meq of Potassium with each kit  Day 3= Sat 9/16, Use 1 kit in AM and 1 kit in PM, take extra 20 meq of Potassium with each kit  STARTING Sunday 9/17 restart Torsemide 60 mg (3 tabs) in AM and 40 mg (2 tabs) in PM   Labs done today, we will call you for abnormal results  Your physician recommends that you schedule a follow-up appointment in: 1 week  Do the following things EVERYDAY: Weigh yourself in the morning before breakfast. Write it down and keep it in a log. Take your medicines as prescribed Eat low salt foods--Limit salt (sodium) to 2000 mg per day.  Stay as active as you can everyday Limit all fluids for the day to less than 2 liters  If you have any questions or concerns before your next appointment please send [place] a message through [place] or call our office at [phone].    TO LEAVE A MESSAGE FOR THE NURSE SELECT OPTION 2, PLEASE LEAVE A MESSAGE INCLUDING: YOUR NAME DATE OF BIRTH CALL BACK NUMBER REASON FOR CALL**this is important as we prioritize the call backs  YOU WILL RECEIVE A CALL BACK THE SAME DAY AS LONG AS YOU CALL BEFORE 4:00 PM   At the [facility], you and your health needs are our priority. As part of our continuing mission to provide you with exceptional heart care, we have created designated Provider Care  Teams. These Care Teams include your primary Cardiologist (physician) and Advanced Practice Providers (APPs- Physician Assistants and Nurse Practitioners) who all work together to provide you with the care you need, when you need it.   You may see any of the following providers on your designated Care Team at your next follow up: Dr Glori Bickers Dr Loralie Champagne Dr. Roxana Hires, NP Lyda Jester, [place] [facility] [place], [place] Forestine Na, NP Audry Riles, PharmD   Please be sure to bring in all your medications bottles to every appointment.

## 2022-02-22 NOTE — Progress Notes (Deleted)
ReDS Vest / Clip - 02/22/22 1000       ReDS Vest / Clip   Station Marker D    Ruler Value 28    ReDS Value Range High volume overload

## 2022-02-22 NOTE — Progress Notes (Signed)
Patient ID: [name], male   DOB: 03-Nov-1970, 51 y.o.   MRN: [id number]   ADVANCED HF CLINIC NOTE   Primary Cardiologist: Dr Domenic Polite HF Cardiologist: Dr Haroldine Laws [name]: Dr Mosetta Pigeon  HPI:  [name] is a 51 y.o. male with history of NICM (diagnosed 10/2010 - norm cors 2012 and 8/75), chronic systolic heart failure EF 15% (08/2013), S/P ICD [employer], smoker, Wilms tumor s/p nephrectomy at age 30 had chemoradiation, scoliosis.    We saw him at the end of March 2015 and had large recurrent R pleural effusion. Referred to Dr. Prescott Gum who placed Pleurex catheter on 4/10, removed on 09/28/13.   CPX (5/15) with RER 1.06, VO2 max 18.5, VE/VCO2 slope 32.1.  Moderate to severe functional limitation, circulatory.  CPX 09/17/16 Peak VO2: 20.5 (53% predicted peak VO2) Slope 35 Underwent L/RHC on 02/12/17. Had normal coronaries and relatively well-compensated hemodynamics. EF 20%. Echo 6/20 EF 15% RV mild HK with severe TR Personally reviewed CPX 8/20 with pVO2 17.1 (down from 20.5)  Slope 35 (stable).   CT abdomen 10/20 c/w cirrhosis. Paracentesis 03/02/19: 3.7 L off. No need for repeat paracenteses  Has been evaluated at [facility] by Dr Mosetta Pigeon for possible transplant. Seen last in 4/21 - felt too early for transplant but getting closer.  Echo 12/17/20: LVEF < 20% RV normal severe TR  Admitted 3/26-28/23 due to volume overload, fever and cough. Purulent sinus drainage. Underwent IV diuresis.  But also received abx. Echo 09/04/21 unchanged. LVEF < 20% G3DD. Severe TR. Entresto and spiro stopped due to low BP  We saw him in 4/23 and repeated CPX. Also referred back to [facility]   Today he returns for HF follow up. Says he has been battling fluid over load for several months. DOE on mild exertion. + LE edema and abdominal bloating. Weight up 13 pounds. Was on torsemide 40/20 and now taking 60/40   Cardiac Studies - CPX (7/23) Pre-Exercise PFTs  FVC 2.79 (61%)      FEV1 2.00 (55%)         FEV1/FVC 72 (89%)        MVV 84 (58%)  Resting HR: 87 Standing HR: 88 Peak HR: 141   (82% age predicted max HR)  BP rest: 90/68 Standing BP: 86/68 BP peak: 96/62  Peak VO2: 14.5 (40% predicted peak VO2)  VeVCo slope 35  - CPX 12/17/20 FVC 2.41 (49%)      FEV1 1.51 (38%)        FEV1/FVC 63 (78%)        MVV 62 (40%)  BP rest: 106/60 Standing BP: 100/62 BP peak: 104/62  Peak VO2: 18.3 (50% predicted peak VO2)  VE/VCO2 slope:  38  Peak RER: 1.10  Ventilatory Threshold: 15.6 (42% predicted or measured peak VO2)  VE/MVV:  84%   - RHC 8/20  RA = 17 RV = 46/18 PA = 51/22 (33) PCW = 28 Fick cardiac output/index =4.0/2.2 PVR =1.0 WU Ao sat = 99% PA sat = 62%, 62% RA/PCWP = 0.61 PaPI = 1.71  - CPX 01/15/19  FVC 2.85 (57%)      FEV1 2.04 (51%)        FEV1/FVC 72 (89%)        MVV 79 (50%)      Resting HR: 96 Peak HR: 157   (90% age predicted max HR)  BP rest: 92/64 BP peak: 100/58  Peak VO2: 17.7 (47% predicted peak VO2)  VE/VCO2 slope:  35  OUES: 1.30  Peak RER: 1.18  Ventilatory Threshold: 14.0 (37% predicted or measured peak VO2 and 79% measured PVO2)  VE/MVV:  58%  O2pulse:  8   (57% predicted O2pulse)   - Echo (11/15): EF 10% RV mildly dilated.   - Echo (2/18): EF 15% Mild MR RV mild HK. Severe TR. Triv MR . RV ok - Echo (6/18): EF 15% RV ok. Moderate to severe TR  - Cath (9/18): Normal cors Ao = 91/60 (74) LV = 102/18 RA =  2 RV = 48/5 PA = 48/21 (31) PCW = 20 Fick cardiac output/index = 4.0/2.3 PVR = 2.8 WU Ao sat = 95% PA sat = 68%, 70% RA/PCWP = 0.10 PaPI = 13.5  - CPX (09/17/16) Resting HR: 86 Peak HR: 150   (85% age predicted max HR) BP rest: 96/80 BP peak: 118/72 Peak VO2: 20.5 (53% predicted peak VO2) Ve/VCO2 slope:  35 OUES: 1.38 Peak RER: 1.04 Ventilatory Threshold: 15.7 (41% predicted or measured peak VO2) Peak RR 32 Peak Ventilation:  47.5 VE/MVV:  50% PETCO2 at peak:  30 O2pulse:  8   (62% predicted O2pulse)  - CPX (5/15) Resting HR: 89  Peak HR: 138   (77% age predicted max HR) BP rest: 106/68 BP peak: 126/74 (IPE) Peak VO2: 18.5 (46.5% predicted peak VO2) VE/VCO2 slope:  32.1 OUES: 1.33 Peak RER: 1.06 Ventilatory Threshold: 13.5 (33.9% predicted peak VO2) Peak RR 31 Peak Ventilation:  37.1 VE/MVV:  62.7% PETCO2 at peak:  33 O2pulse:  7   (58% predicted O2pulse)   SH: Lives with his son 57 year old . Unemployed. Disability approved 3 years ago. Does not drink alcohol. Dips tobacco.   FH: Grandfather MI  ROS: All systems negative except as listed in HPI, PMH and Problem List.  Past Medical History:  Diagnosis Date   AICD (automatic cardioverter/defibrillator) present    Arthritis    Cardiomyopathy, nonischemic ([place])    takes Digoxin daily and Carvedilol daily   CHF (congestive heart failure) (HCC)    takes Lasix daily as well Aldactone   Chronic back pain    Chronic systolic heart failure (HCC)    Cirrhosis ([place])    Depression    takes Prozac daily   GERD (gastroesophageal reflux disease)    takes Omeprazole daily   Hx of Alcohol consumption heavy    rare beer currently   hx of Tobacco abuse    quit smoking 2014   Hyperlipidemia    was on Simvastatin but has been off a year   Insomnia    takes Ambien as needed(just got script yesterday)   Orthostatic hypotension    Scoliosis    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 69 (XRT and chemo)    Current Outpatient Medications  Medication Sig Dispense Refill   aspirin 81 MG tablet Take 81 mg by mouth daily.     carvedilol (COREG) 3.125 MG tablet Take 1 tablet (3.125 mg total) by mouth 2 (two) times daily with a meal. 180 tablet 0   colchicine 0.6 MG tablet 2 tabs immediately and 1 addition tab in 1 hour per day until pain subsides. (Patient taking differently: 2 tabs immediately and 1 addition tab in 1 hour per day until pain subsides. As needed) 30 tablet 1   ENTRESTO 49-51 MG TAKE 1 TABLET BY MOUTH TWICE DAILY, APPOINTMENT REQUIRED FOR FUTURE REFILLS 180 tablet  0   fluticasone (FLONASE) 50 MCG/ACT nasal spray Place 2 sprays into both nostrils  daily. 16 g 6   Furosemide (FUROSCIX) 80 MG/10ML CTKT Inject 80 mg into the skin as directed.     oxyCODONE (OXY IR/ROXICODONE) 5 MG immediate release tablet Take 1 tablet (5 mg total) by mouth 3 (three) times daily as needed for severe pain. 60 tablet 0   polyethylene glycol (MIRALAX / GLYCOLAX) 17 g packet Take 17 g by mouth daily as needed for mild constipation. 14 each 0   Potassium Chloride (KLOR-CON PO) Take 20 mEq by mouth daily.     spironolactone (ALDACTONE) 25 MG tablet Take 1 tablet (25 mg total) by mouth daily. 90 tablet 3   torsemide (DEMADEX) 20 MG tablet Patient takes 3 tablets in the morning and 2 tablets at night.     Blood Glucose Monitoring Suppl (BLOOD GLUCOSE SYSTEM PAK) KIT Please dispense based on patient and insurance preference. Use to monitor FSBS 2-3x weekly. Dx: E11.9. (Patient not taking: Reported on 02/22/2022) 1 each 1   Glucose Blood (BLOOD GLUCOSE TEST STRIPS) STRP Please dispense based on patient and insurance preference. Use to monitor FSBS 2-3x weekly. Dx: E11.9. (Patient not taking: Reported on 02/22/2022) 50 each 1   Lancet Devices MISC Please dispense based on patient and insurance preference. Use to monitor FSBS 2-3x weekly. Dx: E11.9. (Patient not taking: Reported on 02/22/2022) 1 each 1   No current facility-administered medications for this encounter.   BP 108/80   Pulse (!) 109   Wt 74.1 kg (163 lb 6.4 oz)   SpO2 99%   BMI 25.59 kg/m   Wt Readings from Last 3 Encounters:  02/22/22 74.1 kg (163 lb 6.4 oz)  12/20/21 69.7 kg (153 lb 9.6 oz)  11/13/21 70.5 kg (155 lb 6.4 oz)   PHYSICAL EXAM: General:  Sitting on table No resp difficulty HEENT: normal Neck: supple.JVP to ear + prominent v-waves Carotids 2+ bilat; no bruits. No lymphadenopathy or thryomegaly appreciated. Cor: PMI nondisplaced. Regular tachy 3/6 TR Lungs: clear Abdomen: soft, nontender, + distended. No  hepatosplenomegaly. No bruits or masses. Good bowel sounds. Extremities: no cyanosis, clubbing, rash, 2-3+ edema into thighs Neuro: alert & orientedx3, cranial nerves grossly intact. moves all 4 extremities w/o difficulty. Affect pleasant   ICD interrogation:  Occasional NSVT No firings   REDs:  44%  ASSESSMENT & PLAN:  1. Acute on chronic Systolic HF:   - Nonischemic cardiomyopathy,  - Echo 03/2014 EF 10%.  - Echo 2/18 EF 15% RV mild HK  [employer] ICD.   - Echo 6/20 EF 15% RV normal severe TR - Cath 9/18: normal cors. Relatively well-compensated hemodynmaics - W/u has revealed evidence of cirrhosis which I suspect is due to a combination of ETOH and RV failure/severe TR. Synthetic function currently seems ok (Albumin 4.2  INR 1.3 on 9/20) - I doubt TV repair will be sufficient for him so question will be if he will still be a transplant candidate. GI has seen and feels liver disease likely not severe enough to be a barrier to transplant.  May need [facility] team to weigh in as well. - He saw Dr. Mosetta Pigeon in 4/21 who felt we would need to proceed with advanced therapies sooner than later in his disease given cirrhosis but still felt he was a bit early. Recommended close f/u with CPX. - Admitted (3/23) with ADHF in setting of URI - Echo (3/23): EF < 20% G3DD. Severe TR  - CPX 7/23 pVO2 14.5 (40%) slope 35% Flat BP response  - He is  volume overloaded. NYHA IIIB - We discussed options of hospitalization for IV diuresis versus trial of Furoscix - Will plan Furoscix 62m SQ bid x 3 days (6 doses) - Check labs today. RTC early next week - Continue Entresto 49/51 mg bid. Unable to tolerate 97/103 due to low BP. - Continue carvedilol 3.125 mg bid (failed titration of 6.25/3.125). - Continue spironolactone 25 mg daily.  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 - Unable to tolerate digoxin due to heartburn. - Blood type A-pos. - Will need f/u with [facility]   2. Severe TR - Echo  with moderate to severe TR but TV seems structurally intact. ? If related to ICD wire or just dilated annulus.  - Doubt TV repair will be sufficient for him, so question will be if he will still be a transplant candidate. - No change.  3. Cirrhosis - w/u as above - appreciate GI input - due to RV failure.    DGlori Bickers MD  12:26 PM

## 2022-02-22 NOTE — Progress Notes (Signed)
Medication Samples have been provided to the patient.  Drug name: Furoscix       Strength: 80 mg        Qty: 4  LOT: [id number]  Exp.Date: 10/09/23  Dosing instructions: use as directed  The patient has been instructed regarding the correct time, dose, and frequency of taking this medication, including desired effects and most common side effects.   [name] 11:09 AM 02/22/2022

## 2022-02-23 MED ORDER — TORSEMIDE 20 MG PO TABS: ORAL_TABLET | ORAL | 6 refills | Status: DC

## 2022-02-23 MED ORDER — SPIRONOLACTONE 25 MG PO TABS: 25.0000 mg | ORAL_TABLET | Freq: Every day | ORAL | 3 refills | Status: DC

## 2022-02-23 NOTE — Addendum Note (Signed)
Encounter addended by: Scarlette Calico, RN on: 02/23/2022 4:22 PM  Actions taken: Order list changed

## 2022-02-27 ENCOUNTER — Telehealth (HOSPITAL_COMMUNITY): Payer: Self-pay | Admitting: *Deleted

## 2022-02-27 NOTE — Telephone Encounter (Signed)
Furoscix approved through [employer] until 06/10/22  Spoke w/pt via phone on Fri 9/15, at that time he had used 2 kits successfully and could tell fluid was coming off  Called and spoke w/pt this AM, he successfully used kits over weekend and feels much better, he states LE edema has resolved, does not weigh himself at home, he is sch for f/u appt on fri 9/22 and will keep that appt.

## 2022-03-01 NOTE — Progress Notes (Signed)
Remote ICD transmission.   

## 2022-03-01 NOTE — Progress Notes (Addendum)
Patient ID: [name], male   DOB: [birth year], 51 y.o.   MRN: [id number]   ADVANCED HF CLINIC NOTE   Primary Cardiologist: Dr Domenic Polite HF Cardiologist: Dr Haroldine Laws [name]: Dr Mosetta Pigeon  HPI:  [name] is a 51 y.o. male with history of NICM (diagnosed 10/2010 - norm cors 2012 and 2/35), chronic systolic heart failure EF 15% (08/2013), S/P ICD [employer], smoker, Wilms tumor s/p nephrectomy at age 56 had chemoradiation, scoliosis.    We saw him at the end of March 2015 and had large recurrent R pleural effusion. Referred to Dr. Prescott Gum who placed Pleurex catheter on 4/10, removed on 09/28/13.   CPX (5/15) with RER 1.06, VO2 max 18.5, VE/VCO2 slope 32.1.  Moderate to severe functional limitation, circulatory.  CPX 09/17/16 Peak VO2: 20.5 (53% predicted peak VO2) Slope 35 Underwent L/RHC on 02/12/17. Had normal coronaries and relatively well-compensated hemodynamics. EF 20%. Echo 6/20 EF 15% RV mild HK with severe TR Personally reviewed CPX 8/20 with pVO2 17.1 (down from 20.5)  Slope 35 (stable).   CT abdomen 10/20 c/w cirrhosis. Paracentesis 03/02/19: 3.7 L off. No need for repeat paracenteses  Has been evaluated at [facility] by Dr Mosetta Pigeon for possible transplant. Seen last in 4/21 - felt too early for transplant but getting closer.  Echo 12/17/20: LVEF < 20% RV normal severe TR  Admitted 3/26-28/23 due to volume overload, fever and cough. Purulent sinus drainage. Underwent IV diuresis.  But also received abx. Echo 09/04/21 unchanged. LVEF < 20% G3DD. Severe TR. Entresto and spiro stopped due to low BP  We saw him in 4/23 and repeated CPX. Also referred back to [facility]   Seen for follow-up 09/14. He was volume overloaded and weight up 13 lb. Had been taking higher dose of Torsemide. Options discussed including hospitalization for IV diuresis vs trial with Furoscix. He opted for Furoscix and was started on Furoscix 80 mg SQ ( 1 dose 09/14, 2 doses 09/15, 1 dose 09/16).  He is here  today for follow-up. His weight is down 16 lb from last visit. ReDS down from 44% to 39%. Dyspnea and LE edema have significantly improved. Still feels like he has some edema in his abdomen. He is taking all meds as prescribed.      Cardiac Studies - CPX (7/23) Pre-Exercise PFTs  FVC 2.79 (61%)      FEV1 2.00 (55%)        FEV1/FVC 72 (89%)        MVV 84 (58%)  Resting HR: 87 Standing HR: 88 Peak HR: 141   (82% age predicted max HR)  BP rest: 90/68 Standing BP: 86/68 BP peak: 96/62  Peak VO2: 14.5 (40% predicted peak VO2)  VeVCo slope 35  - CPX 12/17/20 FVC 2.41 (49%)      FEV1 1.51 (38%)        FEV1/FVC 63 (78%)        MVV 62 (40%)  BP rest: 106/60 Standing BP: 100/62 BP peak: 104/62  Peak VO2: 18.3 (50% predicted peak VO2)  VE/VCO2 slope:  38  Peak RER: 1.10  Ventilatory Threshold: 15.6 (42% predicted or measured peak VO2)  VE/MVV:  84%   - RHC 8/20  RA = 17 RV = 46/18 PA = 51/22 (33) PCW = 28 Fick cardiac output/index =4.0/2.2 PVR =1.0 WU Ao sat = 99% PA sat = 62%, 62% RA/PCWP = 0.61 PaPI = 1.71  - CPX 01/15/19  FVC 2.85 (57%)  FEV1 2.04 (51%)        FEV1/FVC 72 (89%)        MVV 79 (50%)      Resting HR: 96 Peak HR: 157   (90% age predicted max HR)  BP rest: 92/64 BP peak: 100/58  Peak VO2: 17.7 (47% predicted peak VO2)  VE/VCO2 slope:  35  OUES: 1.30  Peak RER: 1.18  Ventilatory Threshold: 14.0 (37% predicted or measured peak VO2 and 79% measured PVO2)  VE/MVV:  58%  O2pulse:  8   (57% predicted O2pulse)   - Echo (11/15): EF 10% RV mildly dilated.   - Echo (2/18): EF 15% Mild MR RV mild HK. Severe TR. Triv MR . RV ok - Echo (6/18): EF 15% RV ok. Moderate to severe TR  - Cath (9/18): Normal cors Ao = 91/60 (74) LV = 102/18 RA =  2 RV = 48/5 PA = 48/21 (31) PCW = 20 Fick cardiac output/index = 4.0/2.3 PVR = 2.8 WU Ao sat = 95% PA sat = 68%, 70% RA/PCWP = 0.10 PaPI = 13.5  - CPX (09/17/16) Resting HR: 86 Peak HR: 150   (85% age predicted max  HR) BP rest: 96/80 BP peak: 118/72 Peak VO2: 20.5 (53% predicted peak VO2) Ve/VCO2 slope:  35 OUES: 1.38 Peak RER: 1.04 Ventilatory Threshold: 15.7 (41% predicted or measured peak VO2) Peak RR 32 Peak Ventilation:  47.5 VE/MVV:  50% PETCO2 at peak:  30 O2pulse:  8   (62% predicted O2pulse)  - CPX (5/15) Resting HR: 89 Peak HR: 138   (77% age predicted max HR) BP rest: 106/68 BP peak: 126/74 (IPE) Peak VO2: 18.5 (46.5% predicted peak VO2) VE/VCO2 slope:  32.1 OUES: 1.33 Peak RER: 1.06 Ventilatory Threshold: 13.5 (33.9% predicted peak VO2) Peak RR 31 Peak Ventilation:  37.1 VE/MVV:  62.7% PETCO2 at peak:  33 O2pulse:  7   (58% predicted O2pulse)   SH: Lives with his son 93 year old . Unemployed. On disability. Does not drink alcohol. Dips tobacco.   FH: Grandfather MI  ROS: All systems negative except as listed in HPI, PMH and Problem List.  Past Medical History:  Diagnosis Date   AICD (automatic cardioverter/defibrillator) present    Arthritis    Cardiomyopathy, nonischemic ([place])    takes Digoxin daily and Carvedilol daily   CHF (congestive heart failure) (HCC)    takes Lasix daily as well Aldactone   Chronic back pain    Chronic systolic heart failure (HCC)    Cirrhosis ([place])    Depression    takes Prozac daily   GERD (gastroesophageal reflux disease)    takes Omeprazole daily   Hx of Alcohol consumption heavy    rare beer currently   hx of Tobacco abuse    quit smoking 2014   Hyperlipidemia    was on Simvastatin but has been off a year   Insomnia    takes Ambien as needed(just got script yesterday)   Orthostatic hypotension    Scoliosis    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 42 (XRT and chemo)    Current Outpatient Medications  Medication Sig Dispense Refill   aspirin 81 MG tablet Take 81 mg by mouth daily.     carvedilol (COREG) 3.125 MG tablet Take 1 tablet (3.125 mg total) by mouth 2 (two) times daily with a meal. 180 tablet 0   colchicine 0.6 MG  tablet 2 tabs immediately and 1 addition tab in 1 hour per day until  pain subsides. 30 tablet 1   ENTRESTO 49-51 MG TAKE 1 TABLET BY MOUTH TWICE DAILY, APPOINTMENT REQUIRED FOR FUTURE REFILLS 180 tablet 0   fluticasone (FLONASE) 50 MCG/ACT nasal spray Place 2 sprays into both nostrils daily. 16 g 6   metolazone (ZAROXOLYN) 2.5 MG tablet Take 1 tablet (2.5 mg total) by mouth as directed. With additional 40 meq of potassium 5 tablet 0   oxyCODONE (OXY IR/ROXICODONE) 5 MG immediate release tablet Take 1 tablet (5 mg total) by mouth 3 (three) times daily as needed for severe pain. 60 tablet 0   polyethylene glycol (MIRALAX / GLYCOLAX) 17 g packet Take 17 g by mouth daily as needed for mild constipation. 14 each 0   spironolactone (ALDACTONE) 25 MG tablet Take 1 tablet (25 mg total) by mouth daily. 90 tablet 3   torsemide (DEMADEX) 20 MG tablet Take 3 tabs in the morning and 2 tabs in PM 150 tablet 6   Furosemide (FUROSCIX) 80 MG/10ML CTKT Inject 80 mg into the skin as directed. (Patient not taking: Reported on 03/02/2022)     potassium chloride SA (KLOR-CON M) 20 MEQ tablet Take 2 tablets (40 mEq total) by mouth daily. With an additional 40 meq with every metolazone dose 70 tablet 3   No current facility-administered medications for this encounter.   BP 100/68   Pulse (!) 105   Wt 66.9 kg (147 lb 6.4 oz)   SpO2 97%   BMI 23.09 kg/m   Wt Readings from Last 3 Encounters:  03/02/22 66.9 kg (147 lb 6.4 oz)  02/22/22 74.1 kg (163 lb 6.4 oz)  12/20/21 69.7 kg (153 lb 9.6 oz)   PHYSICAL EXAM: General:  Well appearing. Ambulated into clinic. HEENT: normal Neck: supple. JVP 8-10 cm. Carotids 2+ bilat; no bruits.  Cor: PMI nondisplaced. Regular rate & rhythm. No rubs, gallops, 3/6 TR murmur Lungs: clear Abdomen: soft, nontender, + distended.  Extremities: no cyanosis, clubbing, rash, trace edema Neuro: alert & orientedx3, cranial nerves grossly intact. moves all 4 extremities w/o difficulty. Affect  pleasant  ECG: SR 96 bpm, LBBB (personally reviewed)  ICD interrogation:  Several short runs NSVT (last on 09/14). Activity level 1.2 hrs/day. HL index not available.   REDs: 39%   ASSESSMENT & PLAN:  1. Acute on chronic Systolic HF:   - Nonischemic cardiomyopathy - Echo 03/2014 EF 10%.  - Echo 2/18 EF 15% RV mild HK  [employer] ICD.   - Echo 6/20 EF 15% RV normal severe TR - Cath 9/18: normal cors. Relatively well-compensated hemodynmaics - W/u has revealed evidence of cirrhosis which I suspect is due to a combination of ETOH and RV failure/severe TR. Synthetic function currently seems ok (Albumin 4.2  INR 1.3 on 9/20) - I doubt TV repair will be sufficient for him so question will be if he will still be a transplant candidate. GI has seen and feels liver disease likely not severe enough to be a barrier to transplant.  May need [facility] team to weigh in as well. - He saw Dr. Mosetta Pigeon in 4/21 who felt we would need to proceed with advanced therapies sooner than later in his disease given cirrhosis but still felt he was a bit early. Recommended close f/u with CPX. - Admitted (3/23) with ADHF in setting of URI - Echo (3/23): EF < 20% G3DD. Severe TR  - CPX 7/23 pVO2 14.5 (40%) slope 35% Flat BP response  - Volume significantly improved but still has some fluid  on board. ReDS 39%. Continue Torsemide 60 q am and 40 q pm. Had good response to furoscix but prefers po diuretics. Will give have him take 2.5 mg metolazone X 1 with 40 mEq K. - Continue Entresto 49/51 mg bid. Unable to tolerate 97/103 due to low BP. - Continue carvedilol 3.125 mg bid (failed titration of 6.25/3.125). - Continue spironolactone 25 mg daily.  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 - Unable to tolerate digoxin due to heartburn. - Blood type A-pos. - Will refer back to transplant center at [place] today, BMET again in 1 week  2. Severe TR - Echo with moderate to severe TR but TV seems structurally  intact. ? If related to ICD wire or just dilated annulus.  - Doubt TV repair will be sufficient for him, so question will be if he will still be a transplant candidate. - No change.  3. Cirrhosis - w/u as above - appreciate GI input - due to RV failure.  - may be contributing to some of his abdominal distension  Follow-up: 3-4 weeks with APP to assess volume, referral submitted to transplant center at [facility], [place], PA-C  4:24 PM

## 2022-03-02 ENCOUNTER — Ambulatory Visit (HOSPITAL_COMMUNITY)
Admission: RE | Admit: 2022-03-02 | Discharge: 2022-03-02 | Disposition: A | Discharge status: Home / Self Care | Payer: Medicare HMO | Source: Ambulatory Visit | Attending: Physician Assistant | Admitting: Physician Assistant

## 2022-03-02 ENCOUNTER — Encounter (HOSPITAL_COMMUNITY): Payer: Self-pay

## 2022-03-02 VITALS — BP 100/68 | HR 105 | Wt 147.4 lb

## 2022-03-02 DIAGNOSIS — I428 Other cardiomyopathies: Secondary | ICD-10-CM | POA: Insufficient documentation

## 2022-03-02 DIAGNOSIS — Z85528 Personal history of other malignant neoplasm of kidney: Secondary | ICD-10-CM | POA: Diagnosis not present

## 2022-03-02 DIAGNOSIS — J90 Pleural effusion, not elsewhere classified: Secondary | ICD-10-CM | POA: Diagnosis not present

## 2022-03-02 DIAGNOSIS — I361 Nonrheumatic tricuspid (valve) insufficiency: Secondary | ICD-10-CM

## 2022-03-02 DIAGNOSIS — J069 Acute upper respiratory infection, unspecified: Secondary | ICD-10-CM | POA: Insufficient documentation

## 2022-03-02 DIAGNOSIS — M419 Scoliosis, unspecified: Secondary | ICD-10-CM | POA: Insufficient documentation

## 2022-03-02 DIAGNOSIS — Z923 Personal history of irradiation: Secondary | ICD-10-CM | POA: Insufficient documentation

## 2022-03-02 DIAGNOSIS — Z79899 Other long term (current) drug therapy: Secondary | ICD-10-CM | POA: Insufficient documentation

## 2022-03-02 DIAGNOSIS — I5023 Acute on chronic systolic (congestive) heart failure: Secondary | ICD-10-CM | POA: Diagnosis not present

## 2022-03-02 DIAGNOSIS — K7460 Unspecified cirrhosis of liver: Secondary | ICD-10-CM | POA: Diagnosis not present

## 2022-03-02 DIAGNOSIS — Z95810 Presence of automatic (implantable) cardiac defibrillator: Secondary | ICD-10-CM | POA: Diagnosis not present

## 2022-03-02 DIAGNOSIS — Z905 Acquired absence of kidney: Secondary | ICD-10-CM | POA: Diagnosis not present

## 2022-03-02 DIAGNOSIS — I420 Dilated cardiomyopathy: Secondary | ICD-10-CM

## 2022-03-02 DIAGNOSIS — K7469 Other cirrhosis of liver: Secondary | ICD-10-CM

## 2022-03-02 LAB — BASIC METABOLIC PANEL
Anion gap: 12 (ref 5–15)
BUN: 15 mg/dL (ref 6–20)
CO2: 29 mmol/L (ref 22–32)
Calcium: 9 mg/dL (ref 8.9–10.3)
Chloride: 94 mmol/L — ABNORMAL LOW (ref 98–111)
Creatinine, Ser: 1.14 mg/dL (ref 0.61–1.24)
GFR, Estimated: >60 mL/min (ref >60)
Glucose, Bld: 125 mg/dL — ABNORMAL HIGH (ref 70–99)
Potassium: 3.7 mmol/L (ref 3.5–5.1)
Sodium: 135 mmol/L (ref 135–145)

## 2022-03-02 LAB — BRAIN NATRIURETIC PEPTIDE: B Natriuretic Peptide: 3092.6 pg/mL — ABNORMAL HIGH (ref 0.0–100.0)

## 2022-03-02 MED ORDER — METOLAZONE 2.5 MG PO TABS: 2.5000 mg | ORAL_TABLET | ORAL | 0 refills | Status: DC

## 2022-03-02 MED ORDER — POTASSIUM CHLORIDE CRYS ER 20 MEQ PO TBCR: 40.0000 meq | EXTENDED_RELEASE_TABLET | Freq: Every day | ORAL | 3 refills | Status: DC

## 2022-03-02 NOTE — Addendum Note (Signed)
Encounter addended by: Joette Catching, PA-C on: 03/02/2022 4:36 PM  Actions taken: Clinical Note Signed

## 2022-03-02 NOTE — Patient Instructions (Addendum)
Take Metolazone tomorrow 30 mins before your morning dose of torsemide  Take an EXTRA 40 meq of potassium with metolazone   Labs today We will only contact you if something comes back abnormal or we need to make some changes. Otherwise no news is good news!  Labs needed in one week   Your physician recommends that you schedule a follow-up appointment in: 3-4 weeks  in the Advanced Practitioners (PA/NP) Clinic     You have been referred to [facility] with Dr Mosetta Pigeon -they will be in contact with an appointment  Do the following things EVERYDAY: Weigh yourself in the morning before breakfast. Write it down and keep it in a log. Take your medicines as prescribed Eat low salt foods--Limit salt (sodium) to 2000 mg per day.  Stay as active as you can everyday Limit all fluids for the day to less than 2 liters

## 2022-03-05 ENCOUNTER — Telehealth (HOSPITAL_COMMUNITY): Payer: Self-pay | Admitting: Cardiology

## 2022-03-05 NOTE — Telephone Encounter (Signed)
[facility] referral faxed with appropriate notes Dr Mosetta Pigeon Confirmation received

## 2022-03-06 DIAGNOSIS — H40013 Open angle with borderline findings, low risk, bilateral: Secondary | ICD-10-CM | POA: Diagnosis not present

## 2022-03-06 DIAGNOSIS — H35033 Hypertensive retinopathy, bilateral: Secondary | ICD-10-CM | POA: Diagnosis not present

## 2022-03-06 DIAGNOSIS — H2513 Age-related nuclear cataract, bilateral: Secondary | ICD-10-CM | POA: Diagnosis not present

## 2022-03-13 ENCOUNTER — Other Ambulatory Visit (HOSPITAL_COMMUNITY): Payer: Medicare HMO

## 2022-03-15 ENCOUNTER — Telehealth: Payer: Self-pay

## 2022-03-15 ENCOUNTER — Ambulatory Visit (HOSPITAL_COMMUNITY)
Admission: RE | Admit: 2022-03-15 | Discharge: 2022-03-15 | Disposition: A | Discharge status: Home / Self Care | Payer: Medicare HMO | Source: Ambulatory Visit | Attending: Internal Medicine | Admitting: Internal Medicine

## 2022-03-15 ENCOUNTER — Other Ambulatory Visit: Payer: Self-pay | Admitting: Family Medicine

## 2022-03-15 DIAGNOSIS — I50812 Chronic right heart failure: Secondary | ICD-10-CM | POA: Diagnosis not present

## 2022-03-15 MED ORDER — OXYCODONE HCL 5 MG PO TABS: 5.0000 mg | ORAL_TABLET | Freq: Three times a day (TID) | ORAL | 0 refills | Status: DC | PRN

## 2022-03-15 NOTE — Telephone Encounter (Signed)
Pt called requesting refill on Oxycodone. Last RF was 02/02/22. Last OV was 09/18/21. Pt uses [facility]. Thank you.

## 2022-03-22 NOTE — Progress Notes (Signed)
Patient ID: [name], male   DOB: [birth year], 51 y.o.   MRN: [id number]   ADVANCED HF CLINIC NOTE   Primary Cardiologist: Dr Domenic Polite HF Cardiologist: Dr Haroldine Laws [name]: Dr Mosetta Pigeon  HPI:  [name] is a 51 y.o. male with history of NICM (diagnosed 10/2010 - norm cors 2012 and 8/18), chronic systolic heart failure EF 15% (08/2013), S/P ICD [employer], smoker, Wilms tumor s/p nephrectomy at age 76 had chemoradiation, scoliosis.    We saw him at the end of March 2015 and had large recurrent R pleural effusion. Referred to Dr. Prescott Gum who placed Pleurex catheter on 4/10, removed on 09/28/13.   CPX (5/15) with RER 1.06, VO2 max 18.5, VE/VCO2 slope 32.1.  Moderate to severe functional limitation, circulatory.  CPX 09/17/16 Peak VO2: 20.5 (53% predicted peak VO2) Slope 35 Underwent L/RHC on 02/12/17. Had normal coronaries and relatively well-compensated hemodynamics. EF 20%. Echo 6/20 EF 15% RV mild HK with severe TR Personally reviewed CPX 8/20 with pVO2 17.1 (down from 20.5)  Slope 35 (stable).   CT abdomen 10/20 c/w cirrhosis. Paracentesis 03/02/19: 3.7 L off. No need for repeat paracenteses  Has been evaluated at [facility] by Dr Mosetta Pigeon for possible transplant. Seen last in 4/21 - felt too early for transplant but getting closer.  Echo 12/17/20: LVEF < 20% RV normal severe TR  Admitted 3/26-28/23 due to volume overload, fever and cough. Purulent sinus drainage. Underwent IV diuresis.  But also received abx. Echo 09/04/21 unchanged. LVEF < 20% G3DD. Severe TR. Entresto and spiro stopped due to low BP  We saw him in 4/23 and repeated CPX. Also referred back to [facility]   Seen for follow-up 09/14. He was volume overloaded and weight up 13 lb. Had been taking higher dose of Torsemide. Options discussed including hospitalization for IV diuresis vs trial with Furoscix. He opted for Furoscix and was started on Furoscix 80 mg SQ bid x 3 days.  Today he returns for HF follow up. Last  visit volume was better, but still mildly overloaded. Advised taking a dose of metolazone. Overall feeling fine. Abdomen feels tight,l but able to mow his grass, pull firewood and work on trucks without difficulty. Denies palpitations, CP, dizziness, edema, or PND/Orthopnea. Appetite ok. No fever or chills. He does not weigh. Taking all medications. Has not heard from [place] yet regarding transplant work up appt. He has 6 Furoscix units at home.  Cardiac Studies - CPX (7/23) Pre-Exercise PFTs  FVC 2.79 (61%)      FEV1 2.00 (55%)        FEV1/FVC 72 (89%)        MVV 84 (58%)  Resting HR: 87 Standing HR: 88 Peak HR: 141   (82% age predicted max HR)  BP rest: 90/68 Standing BP: 86/68 BP peak: 96/62  Peak VO2: 14.5 (40% predicted peak VO2)  VeVCo slope 35  - CPX 12/17/20 FVC 2.41 (49%)      FEV1 1.51 (38%)        FEV1/FVC 63 (78%)        MVV 62 (40%)  BP rest: 106/60 Standing BP: 100/62 BP peak: 104/62  Peak VO2: 18.3 (50% predicted peak VO2)  VE/VCO2 slope:  38  Peak RER: 1.10  Ventilatory Threshold: 15.6 (42% predicted or measured peak VO2)  VE/MVV:  84%   - RHC 8/20  RA = 17 RV = 46/18 PA = 51/22 (33) PCW = 28 Fick cardiac output/index =4.0/2.2 PVR =1.0 WU Ao sat =  99% PA sat = 62%, 62% RA/PCWP = 0.61 PaPI = 1.71  - CPX 01/15/19  FVC 2.85 (57%)      FEV1 2.04 (51%)        FEV1/FVC 72 (89%)        MVV 79 (50%)      Resting HR: 96 Peak HR: 157   (90% age predicted max HR)  BP rest: 92/64 BP peak: 100/58  Peak VO2: 17.7 (47% predicted peak VO2)  VE/VCO2 slope:  35  OUES: 1.30  Peak RER: 1.18  Ventilatory Threshold: 14.0 (37% predicted or measured peak VO2 and 79% measured PVO2)  VE/MVV:  58%  O2pulse:  8   (57% predicted O2pulse)   - Echo (11/15): EF 10% RV mildly dilated.   - Echo (2/18): EF 15% Mild MR RV mild HK. Severe TR. Triv MR . RV ok - Echo (6/18): EF 15% RV ok. Moderate to severe TR  - Cath (9/18): Normal cors Ao = 91/60 (74) LV = 102/18 RA =  2 RV =  48/5 PA = 48/21 (31) PCW = 20 Fick cardiac output/index = 4.0/2.3 PVR = 2.8 WU Ao sat = 95% PA sat = 68%, 70% RA/PCWP = 0.10 PaPI = 13.5  - CPX (09/17/16) Resting HR: 86 Peak HR: 150   (85% age predicted max HR) BP rest: 96/80 BP peak: 118/72 Peak VO2: 20.5 (53% predicted peak VO2) Ve/VCO2 slope:  35 OUES: 1.38 Peak RER: 1.04 Ventilatory Threshold: 15.7 (41% predicted or measured peak VO2) Peak RR 32 Peak Ventilation:  47.5 VE/MVV:  50% PETCO2 at peak:  30 O2pulse:  8   (62% predicted O2pulse)  - CPX (5/15) Resting HR: 89 Peak HR: 138   (77% age predicted max HR) BP rest: 106/68 BP peak: 126/74 (IPE) Peak VO2: 18.5 (46.5% predicted peak VO2) VE/VCO2 slope:  32.1 OUES: 1.33 Peak RER: 1.06 Ventilatory Threshold: 13.5 (33.9% predicted peak VO2) Peak RR 31 Peak Ventilation:  37.1 VE/MVV:  62.7% PETCO2 at peak:  33 O2pulse:  7   (58% predicted O2pulse)  SH: Lives with his son 34 year old . Unemployed. On disability. Does not drink alcohol. Dips tobacco.   FH: Grandfather MI  ROS: All systems negative except as listed in HPI, PMH and Problem List.  Past Medical History:  Diagnosis Date   AICD (automatic cardioverter/defibrillator) present    Arthritis    Cardiomyopathy, nonischemic ([place])    takes Digoxin daily and Carvedilol daily   CHF (congestive heart failure) (HCC)    takes Lasix daily as well Aldactone   Chronic back pain    Chronic systolic heart failure (HCC)    Cirrhosis ([place])    Depression    takes Prozac daily   GERD (gastroesophageal reflux disease)    takes Omeprazole daily   Hx of Alcohol consumption heavy    rare beer currently   hx of Tobacco abuse    quit smoking 2014   Hyperlipidemia    was on Simvastatin but has been off a year   Insomnia    takes Ambien as needed(just got script yesterday)   Orthostatic hypotension    Scoliosis    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 42 (XRT and chemo)    Current Outpatient Medications  Medication Sig  Dispense Refill   aspirin 81 MG tablet Take 81 mg by mouth daily.     carvedilol (COREG) 3.125 MG tablet Take 1 tablet (3.125 mg total) by mouth 2 (two) times daily with  a meal. 180 tablet 0   colchicine 0.6 MG tablet 2 tabs immediately and 1 addition tab in 1 hour per day until pain subsides. 30 tablet 1   ENTRESTO 49-51 MG TAKE 1 TABLET BY MOUTH TWICE DAILY, APPOINTMENT REQUIRED FOR FUTURE REFILLS 180 tablet 0   fluticasone (FLONASE) 50 MCG/ACT nasal spray Place 2 sprays into both nostrils daily. 16 g 6   metolazone (ZAROXOLYN) 2.5 MG tablet Take 1 tablet (2.5 mg total) by mouth as directed. With additional 40 meq of potassium 5 tablet 0   oxyCODONE (OXY IR/ROXICODONE) 5 MG immediate release tablet Take 1 tablet (5 mg total) by mouth 3 (three) times daily as needed for severe pain. 60 tablet 0   polyethylene glycol (MIRALAX / GLYCOLAX) 17 g packet Take 17 g by mouth daily as needed for mild constipation. 14 each 0   potassium chloride SA (KLOR-CON M) 20 MEQ tablet Take 2 tablets (40 mEq total) by mouth daily. With an additional 40 meq with every metolazone dose 70 tablet 3   spironolactone (ALDACTONE) 25 MG tablet Take 1 tablet (25 mg total) by mouth daily. 90 tablet 3   torsemide (DEMADEX) 20 MG tablet Take 3 tabs in the morning and 2 tabs in PM 150 tablet 6   No current facility-administered medications for this encounter.   BP 96/62   Pulse 98   Wt 68.7 kg (151 lb 6.4 oz)   SpO2 97%   BMI 23.71 kg/m   Wt Readings from Last 3 Encounters:  03/23/22 68.7 kg (151 lb 6.4 oz)  03/02/22 66.9 kg (147 lb 6.4 oz)  02/22/22 74.1 kg (163 lb 6.4 oz)   PHYSICAL EXAM: General:  NAD. No resp difficulty, fatigued appearing, appears older than stated ago. HEENT: Normal Neck: Supple. + v waves, but JVP to ear Carotids 2+ bilat; no bruits. No lymphadenopathy or thryomegaly appreciated. Cor: PMI nondisplaced. Regular rate & rhythm. No rubs, gallops, 3/6. Lungs: Clear Abdomen: +distended, nontender.  No hepatosplenomegaly. No bruits or masses. Good bowel sounds. Extremities: No cyanosis, clubbing, rash, 2+ BLE edema to thighs, legs cool. Neuro: Alert & oriented x 3, cranial nerves grossly intact. Moves all 4 extremities w/o difficulty. Affect pleasant.  ICD interrogation:  average HR 90 bpm. Activity level improved at 2.3 hrs/day. HL index not available (personally reviewed).   REDs: 44%-->39%--47% today.  ASSESSMENT & PLAN: 1. Acute on chronic Systolic HF:   - Nonischemic cardiomyopathy - Echo 03/2014 EF 10%.  - Echo 2/18 EF 15% RV mild HK  [employer] ICD.   - Echo 6/20 EF 15% RV normal severe TR - Cath 9/18: normal cors. Relatively well-compensated hemodynmaics - W/u has revealed evidence of cirrhosis which I suspect is due to a combination of ETOH and RV failure/severe TR. Synthetic function currently seems ok (Albumin 4.2  INR 1.3 on 9/20) - I doubt TV repair will be sufficient for him so question will be if he will still be a transplant candidate. GI has seen and feels liver disease likely not severe enough to be a barrier to transplant.  May need [facility] team to weigh in as well. - He saw Dr. Mosetta Pigeon in 4/21 who felt we would need to proceed with advanced therapies sooner than later in his disease given cirrhosis but still felt he was a bit early. Recommended close f/u with CPX. - Admitted (3/23) with ADHF in setting of URI - Echo (3/23): EF < 20% G3DD. Severe TR  - CPX 7/23 pVO2  14.5 (40%) slope 35% Flat BP response  - NYHA II, but I feel he down-plays his symptoms. Volume back up, ReDs 47%. - Add metolazone 2.5 mg + 40 KCL every Friday. Take a dose today. - Tomorrow, use Furoscix 80 bid + 40 KCL bid for 2 days (hold torsemide while using Furoscix). - After 2 days of Furscix, resume torsemide 60/40.  - Continue Entresto 49/51 mg bid (low BP with increase dose)  - Continue carvedilol 3.125 mg bid (failed titration of 6.25/3.125) for now. May need to stop if BP drops  further. - Continue spironolactone 25 mg daily.  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 - Unable to tolerate digoxin due to heartburn. - Blood type A-pos. - He has been referred back to transplant center at [facility]. Dr. Haroldine Laws personally reached out to [facility] today regarding appt.  - Labs today. Repeat at follow up next week. - Discussed plan with Dr. Haroldine Laws.  2. Severe TR - Echo with moderate to severe TR but TV seems structurally intact. ? If related to ICD wire or just dilated annulus.  - Doubt TV repair will be sufficient for him, so question will be if he will still be a transplant candidate. - No change.  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   - w/u as above - appreciate GI input - may be contributing to some of his abdominal distension  Follow up next week with APP to check fluid (bmet and ReDs).   Rafael Bihari, FNP  11:49 AM  Wendall Stade prescribed  Patient viewed patient education video with QR code for Caren Griffins code for [place] placed on AVS  Call [facility] at [phone] for questions regarding on body infuser.  Day 1  FUROSCIX 80 mg once daily  via on body infuser + KDUR 40 Or  FUROSCIX 80 mg twice daily  via on body infuser+ KDUR 40  Day 2  FUROSCIX 80 mg once daily  via on body infuser+ KDUR 40 Or  FUROSCIX 80 mg twice daily  via on body infuser+ KDUR 40  *Hold torsemide while using Furoscix*

## 2022-03-23 ENCOUNTER — Encounter (HOSPITAL_COMMUNITY): Payer: Self-pay

## 2022-03-23 ENCOUNTER — Ambulatory Visit (HOSPITAL_COMMUNITY)
Admission: RE | Admit: 2022-03-23 | Discharge: 2022-03-23 | Disposition: A | Discharge status: Home / Self Care | Payer: Medicare HMO | Source: Ambulatory Visit | Attending: Family Medicine | Admitting: Family Medicine

## 2022-03-23 VITALS — BP 96/62 | HR 98 | Wt 151.4 lb

## 2022-03-23 DIAGNOSIS — I959 Hypotension, unspecified: Secondary | ICD-10-CM | POA: Diagnosis not present

## 2022-03-23 DIAGNOSIS — I5023 Acute on chronic systolic (congestive) heart failure: Secondary | ICD-10-CM | POA: Diagnosis not present

## 2022-03-23 DIAGNOSIS — Z79899 Other long term (current) drug therapy: Secondary | ICD-10-CM | POA: Insufficient documentation

## 2022-03-23 DIAGNOSIS — I420 Dilated cardiomyopathy: Secondary | ICD-10-CM

## 2022-03-23 DIAGNOSIS — Z720 Tobacco use: Secondary | ICD-10-CM | POA: Insufficient documentation

## 2022-03-23 DIAGNOSIS — I428 Other cardiomyopathies: Secondary | ICD-10-CM | POA: Insufficient documentation

## 2022-03-23 DIAGNOSIS — Z85528 Personal history of other malignant neoplasm of kidney: Secondary | ICD-10-CM | POA: Insufficient documentation

## 2022-03-23 DIAGNOSIS — K7460 Unspecified cirrhosis of liver: Secondary | ICD-10-CM | POA: Diagnosis not present

## 2022-03-23 DIAGNOSIS — M419 Scoliosis, unspecified: Secondary | ICD-10-CM | POA: Insufficient documentation

## 2022-03-23 DIAGNOSIS — Z95810 Presence of automatic (implantable) cardiac defibrillator: Secondary | ICD-10-CM | POA: Insufficient documentation

## 2022-03-23 DIAGNOSIS — I361 Nonrheumatic tricuspid (valve) insufficiency: Secondary | ICD-10-CM

## 2022-03-23 DIAGNOSIS — Z923 Personal history of irradiation: Secondary | ICD-10-CM | POA: Insufficient documentation

## 2022-03-23 DIAGNOSIS — K7469 Other cirrhosis of liver: Secondary | ICD-10-CM

## 2022-03-23 DIAGNOSIS — I5022 Chronic systolic (congestive) heart failure: Secondary | ICD-10-CM

## 2022-03-23 DIAGNOSIS — Z905 Acquired absence of kidney: Secondary | ICD-10-CM | POA: Diagnosis not present

## 2022-03-23 LAB — COMPREHENSIVE METABOLIC PANEL
ALT: 25 U/L (ref 0–44)
AST: 35 U/L (ref 15–41)
Albumin: 3.3 g/dL — ABNORMAL LOW (ref 3.5–5.0)
Alkaline Phosphatase: 249 U/L — ABNORMAL HIGH (ref 38–126)
Anion gap: 11 (ref 5–15)
BUN: 20 mg/dL (ref 6–20)
CO2: 27 mmol/L (ref 22–32)
Calcium: 9 mg/dL (ref 8.9–10.3)
Chloride: 96 mmol/L — ABNORMAL LOW (ref 98–111)
Creatinine, Ser: 1.1 mg/dL (ref 0.61–1.24)
GFR, Estimated: >60 mL/min (ref >60)
Glucose, Bld: 143 mg/dL — ABNORMAL HIGH (ref 70–99)
Potassium: 4.2 mmol/L (ref 3.5–5.1)
Sodium: 134 mmol/L — ABNORMAL LOW (ref 135–145)
Total Bilirubin: 0.9 mg/dL (ref 0.3–1.2)
Total Protein: 6.9 g/dL (ref 6.5–8.1)

## 2022-03-23 LAB — BRAIN NATRIURETIC PEPTIDE: B Natriuretic Peptide: 3018.7 pg/mL — ABNORMAL HIGH (ref 0.0–100.0)

## 2022-03-23 MED ORDER — METOLAZONE 2.5 MG PO TABS: 2.5000 mg | ORAL_TABLET | ORAL | 3 refills | Status: DC

## 2022-03-23 NOTE — Patient Instructions (Addendum)
Your provider has order Furoscix for you. This is an on-body infuser that gives you a dose of Furosemide.     Ensure you write down the time you start your infusion so that if there is a problem you will know how long the infusion lasted  Use Furoscix only AS DIRECTED by our office  Dosing Directions:   Day 1 03/23/2022 FRIDAY Start Metolazone 2.5 mg  with 40 meq of potassium with evening dose of torsemide -Continue Metolazone every Friday thereafter with 40 meq of Potassium  Day 2= 03/24/2022 SATURDAY Use one infuser twice a day with 40 meq of potassium twice a day  HOLD TORSEMIDE  Day 3= 03/25/2022 SUNDAY Use one  infuser  twice a day with 40 meq of potassium twice a day HOLD TORSEMIDE  Day 4 03/26/2022 MONDAY RESTART Torsemide 60 mg in the AM and 40 mg in the PM   Labs today We will only contact you if something comes back abnormal or we need to make some changes. Otherwise no news is good news!  Labs needed in one week   Your physician recommends that you schedule a follow-up appointment in: 1-2 weeks

## 2022-03-23 NOTE — Progress Notes (Signed)
ReDS Vest / Clip - 03/23/22 1100       ReDS Vest / Clip   Station Marker D    Ruler Value 20.5    ReDS Value Range High volume overload    ReDS Actual Value 47

## 2022-03-28 ENCOUNTER — Telehealth: Payer: Self-pay

## 2022-03-28 NOTE — Telephone Encounter (Signed)
Alert from [employer]   Device alert for sustained VT with successful ATP therapy Event occurred 10/17 @ 19:02, EGM shows sustained VT, rate 203, ATP delivered x1, additional VT x 8 beats, then slowing to VS ~ 100 bpm  Reviewed episode with Joey BSX representative appears to be inappropriate therapy, Joey recommended increasing  initial detect duration to 60 seconds and increasing VT-1 zone to 200bpm   If Dr. Lovena Le agrees. Patient has appointment with HF clinic on 10/19 at 3:30 Joey is agreeable to make these changes at that time if Dr. Lovena Le agrees

## 2022-03-29 ENCOUNTER — Ambulatory Visit (HOSPITAL_COMMUNITY)
Admission: RE | Admit: 2022-03-29 | Discharge: 2022-03-29 | Disposition: A | Discharge status: Home / Self Care | Payer: Medicare HMO | Source: Ambulatory Visit | Attending: Adult Health | Admitting: Adult Health

## 2022-03-29 ENCOUNTER — Encounter (HOSPITAL_COMMUNITY): Payer: Self-pay

## 2022-03-29 VITALS — BP 90/64 | HR 95 | Wt 144.2 lb

## 2022-03-29 DIAGNOSIS — I428 Other cardiomyopathies: Secondary | ICD-10-CM | POA: Insufficient documentation

## 2022-03-29 DIAGNOSIS — Z95810 Presence of automatic (implantable) cardiac defibrillator: Secondary | ICD-10-CM | POA: Insufficient documentation

## 2022-03-29 DIAGNOSIS — I5022 Chronic systolic (congestive) heart failure: Secondary | ICD-10-CM | POA: Diagnosis not present

## 2022-03-29 DIAGNOSIS — Z85528 Personal history of other malignant neoplasm of kidney: Secondary | ICD-10-CM | POA: Insufficient documentation

## 2022-03-29 DIAGNOSIS — K219 Gastro-esophageal reflux disease without esophagitis: Secondary | ICD-10-CM | POA: Insufficient documentation

## 2022-03-29 DIAGNOSIS — I509 Heart failure, unspecified: Secondary | ICD-10-CM

## 2022-03-29 DIAGNOSIS — I472 Ventricular tachycardia, unspecified: Secondary | ICD-10-CM | POA: Insufficient documentation

## 2022-03-29 DIAGNOSIS — I071 Rheumatic tricuspid insufficiency: Secondary | ICD-10-CM

## 2022-03-29 DIAGNOSIS — K7460 Unspecified cirrhosis of liver: Secondary | ICD-10-CM | POA: Diagnosis not present

## 2022-03-29 DIAGNOSIS — Z905 Acquired absence of kidney: Secondary | ICD-10-CM | POA: Diagnosis not present

## 2022-03-29 DIAGNOSIS — M419 Scoliosis, unspecified: Secondary | ICD-10-CM | POA: Diagnosis not present

## 2022-03-29 DIAGNOSIS — Z923 Personal history of irradiation: Secondary | ICD-10-CM | POA: Diagnosis not present

## 2022-03-29 DIAGNOSIS — I420 Dilated cardiomyopathy: Secondary | ICD-10-CM | POA: Diagnosis not present

## 2022-03-29 DIAGNOSIS — Z79899 Other long term (current) drug therapy: Secondary | ICD-10-CM | POA: Insufficient documentation

## 2022-03-29 LAB — BASIC METABOLIC PANEL
Anion gap: 13 (ref 5–15)
BUN: 29 mg/dL — ABNORMAL HIGH (ref 6–20)
CO2: 29 mmol/L (ref 22–32)
Calcium: 9.2 mg/dL (ref 8.9–10.3)
Chloride: 93 mmol/L — ABNORMAL LOW (ref 98–111)
Creatinine, Ser: 1.56 mg/dL — ABNORMAL HIGH (ref 0.61–1.24)
GFR, Estimated: 54 mL/min — ABNORMAL LOW (ref >60)
Glucose, Bld: 100 mg/dL — ABNORMAL HIGH (ref 70–99)
Potassium: 3.8 mmol/L (ref 3.5–5.1)
Sodium: 135 mmol/L (ref 135–145)

## 2022-03-29 LAB — MAGNESIUM: Magnesium: 2 mg/dL (ref 1.7–2.4)

## 2022-03-29 LAB — BRAIN NATRIURETIC PEPTIDE: B Natriuretic Peptide: 2251.7 pg/mL — ABNORMAL HIGH (ref 0.0–100.0)

## 2022-03-29 NOTE — Telephone Encounter (Signed)
Reviewed with Dr. Lovena Le and states event appears true VT. No changes at this time per Dr. Lovena Le. Patient called and advised, denies any symptoms and compliant with meds on file. Patient advised of [place] DMV driving restrictions x6 months and shock plan with verbal understanding. Patient advised to call if he has further questions or concerns. Voiced understanding.

## 2022-03-29 NOTE — Patient Instructions (Signed)
Labs done today. We will contact you only if your labs are abnormal.  HOLD METOLAZONE TOMORROW. RESTART 1 TABLET BY MOUTH NEXT FRIDAY  No other medication changes were made. Please continue all current medications as prescribed.  Your physician recommends that you schedule a follow-up appointment in: 3-4 weeks with Dr. Haroldine Laws  If you have any questions or concerns before your next appointment please send [place] a message through [facility] or call our office at [phone].    TO LEAVE A MESSAGE FOR THE NURSE SELECT OPTION 2, PLEASE LEAVE A MESSAGE INCLUDING: YOUR NAME DATE OF BIRTH CALL BACK NUMBER REASON FOR CALL**this is important as we prioritize the call backs  YOU WILL RECEIVE A CALL BACK THE SAME DAY AS LONG AS YOU CALL BEFORE 4:00 PM   Do the following things EVERYDAY: Weigh yourself in the morning before breakfast. Write it down and keep it in a log. Take your medicines as prescribed Eat low salt foods--Limit salt (sodium) to 2000 mg per day.  Stay as active as you can everyday Limit all fluids for the day to less than 2 liters   At the [facility], you and your health needs are our priority. As part of our continuing mission to provide you with exceptional heart care, we have created designated Provider Care Teams. These Care Teams include your primary Cardiologist (physician) and Advanced Practice Providers (APPs- Physician Assistants and Nurse Practitioners) who all work together to provide you with the care you need, when you need it.   You may see any of the following providers on your designated Care Team at your next follow up: Dr Glori Bickers Dr Haynes Kerns, NP Lyda Jester, [place] Audry Riles, PharmD   Please be sure to bring in all your medications bottles to every appointment.

## 2022-03-29 NOTE — Progress Notes (Signed)
Patient ID: [name], male   DOB: [birth year], 51 y.o.   MRN: [id number]   ADVANCED HF CLINIC NOTE   Primary Cardiologist: Dr Domenic Polite HF Cardiologist: Dr Haroldine Laws [name]: Dr Mosetta Pigeon  HPI: [name] is a 51 y.o. male with history of NICM (diagnosed 10/2010 - norm cors 2012 and 3/08), chronic systolic heart failure EF 15% (08/2013), S/P ICD [employer], smoker, Wilms tumor s/p nephrectomy at age 30 had chemoradiation, scoliosis.    We saw him at the end of March 2015 and had large recurrent R pleural effusion. Referred to Dr. Prescott Gum who placed Pleurex catheter on 4/10, removed on 09/28/13.   CPX (5/15) with RER 1.06, VO2 max 18.5, VE/VCO2 slope 32.1.  Moderate to severe functional limitation, circulatory.  CPX 09/17/16 Peak VO2: 20.5 (53% predicted peak VO2) Slope 35 Underwent L/RHC on 02/12/17. Had normal coronaries and relatively well-compensated hemodynamics. EF 20%. Echo 6/20 EF 15% RV mild HK with severe TR Personally reviewed CPX 8/20 with pVO2 17.1 (down from 20.5)  Slope 35 (stable).   CT abdomen 10/20 c/w cirrhosis. Paracentesis 03/02/19: 3.7 L off. No need for repeat paracenteses  Has been evaluated at [facility] by Dr Mosetta Pigeon for possible transplant. Seen last in 4/21 - felt too early for transplant but getting closer.  Echo 12/17/20: LVEF < 20% RV normal severe TR  Admitted 3/26-28/23 due to volume overload, fever and cough. Purulent sinus drainage. Underwent IV diuresis.  But also received abx. Echo 09/04/21 unchanged. LVEF < 20% G3DD. Severe TR. Entresto and spiro stopped due to low BP  He was seen in the HF clinic 4/23. CPX repeat and referred to [facility]   He was seen in the HF clinic 02/22/22. Volume overloaded despite higher dose of torsemide. Instructed to take Furoscix  bid x 3 days.  F/U in HF clinic 03/23/22 and remained fluid overloaded. Got Furoscix daily x2 days.  On 03/27/22 had VT with successful ATP. EP reviewed and therapies were appropriate. He was  unaware of the event. Called by EP and given [place] DMV no driving for 6 months.   Today he returns for HF follow up.Overall feeling much better  Denies PND/Orthopnea.  SOB with steps. Appetite ok . He eats fast food everyday and chips on most days.  No fever or chills. Weight at home has gone down from 153-->143 pounds. Taking all medications. Lives alone but has lots of family support from his son, parents, brother, and sister.    Cardiac Studies - CPX (7/23) Pre-Exercise PFTs  FVC 2.79 (61%)      FEV1 2.00 (55%)        FEV1/FVC 72 (89%)        MVV 84 (58%)  Resting HR: 87 Standing HR: 88 Peak HR: 141   (82% age predicted max HR)  BP rest: 90/68 Standing BP: 86/68 BP peak: 96/62  Peak VO2: 14.5 (40% predicted peak VO2)  VeVCo slope 35  - CPX 12/17/20 FVC 2.41 (49%)      FEV1 1.51 (38%)        FEV1/FVC 63 (78%)        MVV 62 (40%)  BP rest: 106/60 Standing BP: 100/62 BP peak: 104/62  Peak VO2: 18.3 (50% predicted peak VO2)  VE/VCO2 slope:  38  Peak RER: 1.10  Ventilatory Threshold: 15.6 (42% predicted or measured peak VO2)  VE/MVV:  84%   - RHC 8/20  RA = 17 RV = 46/18 PA = 51/22 (33) PCW = 28  Fick cardiac output/index =4.0/2.2 PVR =1.0 WU Ao sat = 99% PA sat = 62%, 62% RA/PCWP = 0.61 PaPI = 1.71  - CPX 01/15/19  FVC 2.85 (57%)      FEV1 2.04 (51%)        FEV1/FVC 72 (89%)        MVV 79 (50%)     Resting HR: 96 Peak HR: 157   (90% age predicted max HR)  BP rest: 92/64 BP peak: 100/58  Peak VO2: 17.7 (47% predicted peak VO2)  VE/VCO2 slope:  35  OUES: 1.30  Peak RER: 1.18  Ventilatory Threshold: 14.0 (37% predicted or measured peak VO2 and 79% measured PVO2)  VE/MVV:  58%  O2pulse:  8   (57% predicted O2pulse)   - Echo (11/15): EF 10% RV mildly dilated.   - Echo (2/18): EF 15% Mild MR RV mild HK. Severe TR. Triv MR . RV ok - Echo (6/18): EF 15% RV ok. Moderate to severe TR  - Cath (9/18): Normal cors Ao = 91/60 (74) LV = 102/18 RA =  2 RV = 48/5 PA = 48/21  (31) PCW = 20 Fick cardiac output/index = 4.0/2.3 PVR = 2.8 WU Ao sat = 95% PA sat = 68%, 70% RA/PCWP = 0.10 PaPI = 13.5  - CPX (09/17/16) Resting HR: 86 Peak HR: 150   (85% age predicted max HR) BP rest: 96/80 BP peak: 118/72 Peak VO2: 20.5 (53% predicted peak VO2) Ve/VCO2 slope:  35 OUES: 1.38 Peak RER: 1.04 Ventilatory Threshold: 15.7 (41% predicted or measured peak VO2) Peak RR 32 Peak Ventilation:  47.5 VE/MVV:  50% PETCO2 at peak:  30 O2pulse:  8   (62% predicted O2pulse)  - CPX (5/15) Resting HR: 89 Peak HR: 138   (77% age predicted max HR) BP rest: 106/68 BP peak: 126/74 (IPE) Peak VO2: 18.5 (46.5% predicted peak VO2) VE/VCO2 slope:  32.1 OUES: 1.33 Peak RER: 1.06 Ventilatory Threshold: 13.5 (33.9% predicted peak VO2) Peak RR 31 Peak Ventilation:  37.1 VE/MVV:  62.7% PETCO2 at peak:  33 O2pulse:  7   (58% predicted O2pulse)  SH: Lives alone. Unemployed. On disability. Does not drink alcohol.  FH: Grandfather MI  ROS: All systems negative except as listed in HPI, PMH and Problem List.  Past Medical History:  Diagnosis Date   AICD (automatic cardioverter/defibrillator) present    Arthritis    Cardiomyopathy, nonischemic ([place])    takes Digoxin daily and Carvedilol daily   CHF (congestive heart failure) ([place])    takes Lasix daily as well Aldactone   Chronic back pain    Chronic systolic heart failure ([place])    Cirrhosis ([place])    Depression    takes Prozac daily   GERD (gastroesophageal reflux disease)    takes Omeprazole daily   Hx of Alcohol consumption heavy    rare beer currently   hx of Tobacco abuse    quit smoking 2014   Hyperlipidemia    was on Simvastatin but has been off a year   Insomnia    takes Ambien as needed(just got script yesterday)   Orthostatic hypotension    Scoliosis    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 49 (XRT and chemo)    Current Outpatient Medications  Medication Sig Dispense Refill   aspirin 81 MG tablet Take 81 mg by  mouth daily.     carvedilol (COREG) 3.125 MG tablet Take 1 tablet (3.125 mg total) by mouth 2 (two) times daily with  a meal. 180 tablet 0   colchicine 0.6 MG tablet 2 tabs immediately and 1 addition tab in 1 hour per day until pain subsides. 30 tablet 1   ENTRESTO 49-51 MG TAKE 1 TABLET BY MOUTH TWICE DAILY, APPOINTMENT REQUIRED FOR FUTURE REFILLS 180 tablet 0   fluticasone (FLONASE) 50 MCG/ACT nasal spray Place 2 sprays into both nostrils daily. 16 g 6   metolazone (ZAROXOLYN) 2.5 MG tablet Take 1 tablet (2.5 mg total) by mouth once a week. Fridays  With additional 40 meq of potassium 5 tablet 3   oxyCODONE (OXY IR/ROXICODONE) 5 MG immediate release tablet Take 1 tablet (5 mg total) by mouth 3 (three) times daily as needed for severe pain. 60 tablet 0   polyethylene glycol (MIRALAX / GLYCOLAX) 17 g packet Take 17 g by mouth daily as needed for mild constipation. 14 each 0   potassium chloride SA (KLOR-CON M) 20 MEQ tablet Take 2 tablets (40 mEq total) by mouth daily. With an additional 40 meq with every metolazone dose 70 tablet 3   spironolactone (ALDACTONE) 25 MG tablet Take 1 tablet (25 mg total) by mouth daily. 90 tablet 3   torsemide (DEMADEX) 20 MG tablet Take 3 tabs in the morning and 2 tabs in PM 150 tablet 6   No current facility-administered medications for this encounter.   BP 90/64   Pulse 95   Wt 65.4 kg (144 lb 3.2 oz)   SpO2 98%   BMI 22.58 kg/m   Wt Readings from Last 3 Encounters:  03/29/22 65.4 kg (144 lb 3.2 oz)  03/23/22 68.7 kg (151 lb 6.4 oz)  03/02/22 66.9 kg (147 lb 6.4 oz)   PHYSICAL EXAM: General:  Walked in the clinic.  No resp difficulty HEENT: normal Neck: supple. no JVD. Carotids 2+ bilat; no bruits. No lymphadenopathy or thryomegaly appreciated. Cor: PMI nondisplaced. Regular rate & rhythm. No rubs, gallops or murmurs. Lungs: clear Abdomen: soft, nontender, nondistended. No hepatosplenomegaly. No bruits or masses. Good bowel sounds. Extremities: no  cyanosis, clubbing, rash, edema Neuro: alert & orientedx3, cranial nerves grossly intact. moves all 4 extremities w/o difficulty. Affect pleasant   ASSESSMENT & PLAN: 1. chronic Systolic HF:   - Nonischemic cardiomyopathy - Echo 03/2014 EF 10%.  - Echo 2/18 EF 15% RV mild HK  [employer] ICD.   - Echo 6/20 EF 15% RV normal severe TR - Cath 9/18: normal cors. Relatively well-compensated hemodynmaics - W/u has revealed evidence of cirrhosis which I suspect is due to a combination of ETOH and RV failure/severe TR. Synthetic function currently seems ok (Albumin 4.2  INR 1.3 on 9/20) - Doubt TV repair will be sufficient for him so question will be if he will still be a transplant candidate. GI has seen and feels liver disease likely not severe enough to be a barrier to transplant.  May need [facility] team to weigh in as well. - He saw Dr. Mosetta Pigeon in 4/21 who felt we would need to proceed with advanced therapies sooner than later in his disease given cirrhosis but still felt he was a bit early. Recommended close f/u with CPX. - Admitted (3/23) with ADHF in setting of URI - Echo (3/23): EF < 20% G3DD. Severe TR  - CPX 7/23 pVO2 14.5 (40%) slope 35% Flat BP response  - NYHA II. Suspect he  down plays symptoms. Volume status improved. Suspect in the setting of high sodium diet.  - Tomorrow he will hold metolazone and can restart  next week. Concerned potassium may be low given ATP for VT. Check BMET today.  - Continue Entresto 49/51 mg bid (low BP with increase dose)  - Continue carvedilol 3.125 mg bid (failed titration of 6.25/3.125) for now. May need to stop if BP drops further.  - Continue spironolactone 25 mg daily.  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 - Unable to tolerate digoxin due to heartburn. - Blood type A-pos. - He has been referred back to transplant center at [facility]. We called Dr Haroldine Laws and he told him that he would like him to complete the 4 days transplant evaluation. He will  call tomorrow to set up transplant work up at [facility].   2. Severe TR - Echo with moderate to severe TR but TV seems structurally intact. ? If related to ICD wire or just dilated annulus.  - Doubt TV repair will be sufficient for him, so question will be if he will still be a transplant candidate. - no change.   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   - w/u as above - appreciate GI input - may be contributing to some of his abdominal distension  4. VT 03/27/22 had successful ATP. Appropriate therapy for VT.   Check BMET and Mag today.  EP informed about [place] DMV  driving restrictions x6 months.   Follow up with 3-4 weeks with Dr Haroldine Laws.  Darrick Grinder, NP  3:48 PM

## 2022-04-12 ENCOUNTER — Telehealth: Payer: Self-pay

## 2022-04-12 NOTE — Telephone Encounter (Signed)
Pt called requesting a refill on his Oxycodone. Last RF 03/15/2022 and last OV 09/18/2021. 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

## 2022-04-13 ENCOUNTER — Other Ambulatory Visit: Payer: Self-pay | Admitting: Family Medicine

## 2022-04-13 MED ORDER — OXYCODONE HCL 5 MG PO TABS
5.0000 mg | ORAL_TABLET | Freq: Three times a day (TID) | ORAL | 0 refills | Status: DC | PRN
Start: 2022-04-13 — End: 2022-05-11

## 2022-04-28 NOTE — Progress Notes (Incomplete)
Patient ID: [name], male   DOB: 1971/03/23, 51 y.o.   MRN: [id number]   ADVANCED HF CLINIC NOTE   Primary Cardiologist: Dr Domenic Polite HF Cardiologist: Dr Haroldine Laws [name]: Dr Mosetta Pigeon  HPI: [name] is a 51 y.o. male with history of NICM (diagnosed 10/2010 - norm cors 2012 and 7/03), chronic systolic heart failure EF 15% (08/2013), S/P ICD [employer], smoker, Wilms tumor s/p nephrectomy at age 47 had chemoradiation, scoliosis.    We saw him at the end of March 2015 and had large recurrent R pleural effusion. Referred to Dr. Prescott Gum who placed Pleurex catheter on 4/10, removed on 09/28/13.   CPX (5/15) with RER 1.06, VO2 max 18.5, VE/VCO2 slope 32.1.  Moderate to severe functional limitation, circulatory.  CPX 09/17/16 Peak VO2: 20.5 (53% predicted peak VO2) Slope 35 Underwent L/RHC on 02/12/17. Had normal coronaries and relatively well-compensated hemodynamics. EF 20%. Echo 6/20 EF 15% RV mild HK with severe TR Personally reviewed CPX 8/20 with pVO2 17.1 (down from 20.5)  Slope 35 (stable).   CT abdomen 10/20 c/w cirrhosis. Paracentesis 03/02/19: 3.7 L off. No need for repeat paracenteses  Has been evaluated at [facility] by Dr Mosetta Pigeon for possible transplant. Seen last in 4/21 - felt too early for transplant but getting closer.  Echo 12/17/20: LVEF < 20% RV normal severe TR  Admitted 3/26-28/23 due to volume overload, fever and cough. Purulent sinus drainage. Underwent IV diuresis.  But also received abx. Echo 09/04/21 unchanged. LVEF < 20% G3DD. Severe TR. Entresto and spiro stopped due to low BP  He was seen in the HF clinic 4/23. CPX repeat and referred to [facility]   He was seen in the HF clinic 02/22/22. Volume overloaded despite higher dose of torsemide. Instructed to take Furoscix  bid x 3 days.  F/U in HF clinic 03/23/22 and remained fluid overloaded. Got Furoscix daily x2 days.  On 03/27/22 had VT with successful ATP. EP reviewed and therapies were appropriate. He was  unaware of the event. Called by EP and given [place] DMV no driving for 6 months.   Today he returns for HF follow up.Overall feeling much better  Edema finally controlled. Weight stable at 143. Back to baseline. Able to go hunting every day but has to take his time. No CP,edema, orthopnea or PND. Has transplant w/u at [facility] 12/11.  ICD: Interrogated in clinic: volume ok Activity level 2.7 hrs Personally reviewed   Cardiac Studies - CPX (7/23) Pre-Exercise PFTs  FVC 2.79 (61%)      FEV1 2.00 (55%)        FEV1/FVC 72 (89%)        MVV 84 (58%)  Resting HR: 87 Standing HR: 88 Peak HR: 141   (82% age predicted max HR)  BP rest: 90/68 Standing BP: 86/68 BP peak: 96/62  Peak VO2: 14.5 (40% predicted peak VO2)  VeVCo slope 35  - CPX 12/17/20 FVC 2.41 (49%)      FEV1 1.51 (38%)        FEV1/FVC 63 (78%)        MVV 62 (40%)  BP rest: 106/60 Standing BP: 100/62 BP peak: 104/62  Peak VO2: 18.3 (50% predicted peak VO2)  VE/VCO2 slope:  38  Peak RER: 1.10  Ventilatory Threshold: 15.6 (42% predicted or measured peak VO2)  VE/MVV:  84%   - RHC 8/20  RA = 17 RV = 46/18 PA = 51/22 (33) PCW = 28 Fick cardiac output/index =4.0/2.2 PVR =1.0  WU Ao sat = 99% PA sat = 62%, 62% RA/PCWP = 0.61 PaPI = 1.71    SH: Lives alone. Unemployed. On disability. Does not drink alcohol.  FH: Grandfather MI  ROS: All systems negative except as listed in HPI, PMH and Problem List.  Past Medical History:  Diagnosis Date   AICD (automatic cardioverter/defibrillator) present    Arthritis    Cardiomyopathy, nonischemic ([place])    takes Digoxin daily and Carvedilol daily   CHF (congestive heart failure) ([place])    takes Lasix daily as well Aldactone   Chronic back pain    Chronic systolic heart failure ([place])    Cirrhosis ([place])    Depression    takes Prozac daily   GERD (gastroesophageal reflux disease)    takes Omeprazole daily   Hx of Alcohol consumption heavy    rare beer currently   hx of Tobacco abuse     quit smoking 2014   Hyperlipidemia    was on Simvastatin but has been off a year   Insomnia    takes Ambien as needed(just got script yesterday)   Orthostatic hypotension    Scoliosis    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 49 (XRT and chemo)    Current Outpatient Medications  Medication Sig Dispense Refill   aspirin 81 MG tablet Take 81 mg by mouth daily.     carvedilol (COREG) 3.125 MG tablet Take 1 tablet (3.125 mg total) by mouth 2 (two) times daily with a meal. 180 tablet 0   colchicine 0.6 MG tablet 2 tabs immediately and 1 addition tab in 1 hour per day until pain subsides. 30 tablet 1   ENTRESTO 49-51 MG TAKE 1 TABLET BY MOUTH TWICE DAILY, APPOINTMENT REQUIRED FOR FUTURE REFILLS 180 tablet 0   fluticasone (FLONASE) 50 MCG/ACT nasal spray Place 2 sprays into both nostrils daily. 16 g 6   metolazone (ZAROXOLYN) 2.5 MG tablet Take 2.5 mg by mouth every 14 (fourteen) days.     oxyCODONE (OXY IR/ROXICODONE) 5 MG immediate release tablet Take 1 tablet (5 mg total) by mouth 3 (three) times daily as needed for severe pain. 60 tablet 0   polyethylene glycol (MIRALAX / GLYCOLAX) 17 g packet Take 17 g by mouth daily as needed for mild constipation. 14 each 0   potassium chloride SA (KLOR-CON M) 20 MEQ tablet Take 2 tablets (40 mEq total) by mouth daily. With an additional 40 meq with every metolazone dose 70 tablet 3   spironolactone (ALDACTONE) 25 MG tablet Take 1 tablet (25 mg total) by mouth daily. 90 tablet 3   torsemide (DEMADEX) 20 MG tablet Take 3 tabs in the morning and 2 tabs in PM 150 tablet 6   No current facility-administered medications for this encounter.   BP 104/70   Pulse 89   Wt 66.6 kg (146 lb 12.8 oz)   SpO2 100%   BMI 22.99 kg/m   Wt Readings from Last 3 Encounters:  04/30/22 66.6 kg (146 lb 12.8 oz)  03/29/22 65.4 kg (144 lb 3.2 oz)  03/23/22 68.7 kg (151 lb 6.4 oz)   PHYSICAL EXAM: General:  Well appearing. No resp difficulty HEENT: normal Neck: supple. no  JVD. Carotids 2+ bilat; no bruits. No lymphadenopathy or thryomegaly appreciated. Cor: PMI nondisplaced. Regular rate & rhythm. 2/6 TR/MR Lungs: clear Abdomen: soft, nontender, nondistended. No hepatosplenomegaly. No bruits or masses. Good bowel sounds. Extremities: no cyanosis, clubbing, rash, edema Neuro: alert & orientedx3, cranial nerves grossly intact. moves  all 4 extremities w/o difficulty. Affect pleasant   ASSESSMENT & PLAN: 1. Chronic Systolic HF:   - Nonischemic cardiomyopathy - Echo 03/2014 EF 10%.  - Echo 2/18 EF 15% RV mild HK  [employer] ICD.   - Echo 6/20 EF 15% RV normal severe TR - Cath 9/18: normal cors. Relatively well-compensated hemodynmaics - W/u has revealed evidence of cirrhosis which I suspect is due to a combination of ETOH and RV failure/severe TR. Synthetic function currently seems ok (Albumin 4.2  INR 1.3 on 9/20) - Doubt TV repair will be sufficient for him so question will be if he will still be a transplant candidate. GI has seen and feels liver disease likely not severe enough to be a barrier to transplant.  May need [facility] team to weigh in as well. - He saw Dr. Mosetta Pigeon in 4/21 who felt we would need to proceed with advanced therapies sooner than later in his disease given cirrhosis but still felt he was a bit early. Recommended close f/u with CPX. - Admitted (3/23) with ADHF in setting of URI - Echo (3/23): EF < 20% G3DD. Severe TR  - CPX 7/23 pVO2 14.5 (40%) slope 35% Flat BP response  - Stable NYHA III-IIIB - Continue Entresto 49/51 mg bid (low BP with increase dose)  - Continue carvedilol 3.125 mg bid (failed titration of 6.25/3.125) for now. May need to stop if BP drops further.  - Continue spironolactone 25 mg daily.  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 - Unable to tolerate digoxin due to heartburn. - Blood type A-pos. - He has been referred back to transplant center at [facility]. Transplant w/u at [facility] 12/11  2. Severe TR - Echo with  moderate to severe TR but TV seems structurally intact. ? If related to ICD wire or just dilated annulus.  - Doubt TV repair will be sufficient for him, so question will be if he will still be a transplant candidate. 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   - w/u as above - appreciate GI input  4. VT - 03/27/22 had successful ATP. Appropriate therapy for VT.   - driving restrictions x6 months.    Glori Bickers, MD  9:25 AM

## 2022-04-30 ENCOUNTER — Ambulatory Visit (HOSPITAL_COMMUNITY)
Admission: RE | Admit: 2022-04-30 | Discharge: 2022-04-30 | Disposition: A | Discharge status: Home / Self Care | Payer: Medicare HMO | Source: Ambulatory Visit | Attending: Internal Medicine | Admitting: Internal Medicine

## 2022-04-30 ENCOUNTER — Encounter (HOSPITAL_COMMUNITY): Payer: Self-pay | Admitting: Internal Medicine

## 2022-04-30 VITALS — BP 104/70 | HR 89 | Wt 146.8 lb

## 2022-04-30 DIAGNOSIS — Z905 Acquired absence of kidney: Secondary | ICD-10-CM | POA: Diagnosis not present

## 2022-04-30 DIAGNOSIS — I361 Nonrheumatic tricuspid (valve) insufficiency: Secondary | ICD-10-CM | POA: Diagnosis not present

## 2022-04-30 DIAGNOSIS — I428 Other cardiomyopathies: Secondary | ICD-10-CM | POA: Diagnosis not present

## 2022-04-30 DIAGNOSIS — Z6710 Type A blood, Rh positive: Secondary | ICD-10-CM | POA: Diagnosis not present

## 2022-04-30 DIAGNOSIS — I5022 Chronic systolic (congestive) heart failure: Secondary | ICD-10-CM

## 2022-04-30 DIAGNOSIS — K7460 Unspecified cirrhosis of liver: Secondary | ICD-10-CM | POA: Diagnosis not present

## 2022-04-30 DIAGNOSIS — Z85528 Personal history of other malignant neoplasm of kidney: Secondary | ICD-10-CM | POA: Diagnosis not present

## 2022-04-30 DIAGNOSIS — Z01818 Encounter for other preprocedural examination: Secondary | ICD-10-CM | POA: Insufficient documentation

## 2022-04-30 DIAGNOSIS — I959 Hypotension, unspecified: Secondary | ICD-10-CM | POA: Diagnosis not present

## 2022-04-30 DIAGNOSIS — I472 Ventricular tachycardia, unspecified: Secondary | ICD-10-CM

## 2022-04-30 DIAGNOSIS — Z87891 Personal history of nicotine dependence: Secondary | ICD-10-CM | POA: Diagnosis not present

## 2022-04-30 DIAGNOSIS — M419 Scoliosis, unspecified: Secondary | ICD-10-CM | POA: Diagnosis not present

## 2022-04-30 DIAGNOSIS — Z79899 Other long term (current) drug therapy: Secondary | ICD-10-CM | POA: Diagnosis not present

## 2022-04-30 DIAGNOSIS — Z923 Personal history of irradiation: Secondary | ICD-10-CM | POA: Diagnosis not present

## 2022-04-30 DIAGNOSIS — Z95810 Presence of automatic (implantable) cardiac defibrillator: Secondary | ICD-10-CM | POA: Diagnosis not present

## 2022-04-30 LAB — COMPREHENSIVE METABOLIC PANEL
ALT: 29 U/L (ref 0–44)
AST: 43 U/L — ABNORMAL HIGH (ref 15–41)
Albumin: 3.6 g/dL (ref 3.5–5.0)
Alkaline Phosphatase: 229 U/L — ABNORMAL HIGH (ref 38–126)
Anion gap: 14 (ref 5–15)
BUN: 24 mg/dL — ABNORMAL HIGH (ref 6–20)
CO2: 29 mmol/L (ref 22–32)
Calcium: 9.5 mg/dL (ref 8.9–10.3)
Chloride: 93 mmol/L — ABNORMAL LOW (ref 98–111)
Creatinine, Ser: 1.32 mg/dL — ABNORMAL HIGH (ref 0.61–1.24)
GFR, Estimated: >60 mL/min (ref >60)
Glucose, Bld: 136 mg/dL — ABNORMAL HIGH (ref 70–99)
Potassium: 3.5 mmol/L (ref 3.5–5.1)
Sodium: 136 mmol/L (ref 135–145)
Total Bilirubin: 1.2 mg/dL (ref 0.3–1.2)
Total Protein: 7.4 g/dL (ref 6.5–8.1)

## 2022-04-30 LAB — BRAIN NATRIURETIC PEPTIDE: B Natriuretic Peptide: 3033.1 pg/mL — ABNORMAL HIGH (ref 0.0–100.0)

## 2022-04-30 MED ORDER — ENTRESTO 49-51 MG PO TABS: 1.0000 | ORAL_TABLET | Freq: Two times a day (BID) | ORAL | 3 refills | Status: DC

## 2022-04-30 NOTE — Addendum Note (Signed)
Encounter addended by: Jerl Mina, RN on: 04/30/2022 9:34 AM  Actions taken: Diagnosis association updated, Order list changed, Clinical Note Signed, Charge Capture section accepted

## 2022-04-30 NOTE — Patient Instructions (Signed)
There has been no changes to your medications.  Labs done today, your results will be available in MyChart, we will contact you for abnormal readings.  Your physician recommends that you schedule a follow-up appointment in: 3 months ( February 2024) ** please call the office in [facility] December to arrange your follow up appointment **  If you have any questions or concerns before your next appointment please send [place] a message through [place] or call our office at [phone].    TO LEAVE A MESSAGE FOR THE NURSE SELECT OPTION 2, PLEASE LEAVE A MESSAGE INCLUDING: YOUR NAME DATE OF BIRTH CALL BACK NUMBER REASON FOR CALL**this is important as we prioritize the call backs  YOU WILL RECEIVE A CALL BACK THE SAME DAY AS LONG AS YOU CALL BEFORE 4:00 PM  At the [facility], you and your health needs are our priority. As part of our continuing mission to provide you with exceptional heart care, we have created designated Provider Care Teams. These Care Teams include your primary Cardiologist (physician) and Advanced Practice Providers (APPs- Physician Assistants and Nurse Practitioners) who all work together to provide you with the care you need, when you need it.   You may see any of the following providers on your designated Care Team at your next follow up: Dr Glori Bickers Dr Loralie Champagne Dr. Roxana Hires, NP Lyda Jester, [place] [facility] [place], [place] Forestine Na, NP Audry Riles, PharmD   Please be sure to bring in all your medications bottles to every appointment.

## 2022-05-07 ENCOUNTER — Encounter: Payer: Self-pay | Admitting: Internal Medicine

## 2022-05-10 ENCOUNTER — Ambulatory Visit (INDEPENDENT_AMBULATORY_CARE_PROVIDER_SITE_OTHER): Payer: Medicare HMO

## 2022-05-10 DIAGNOSIS — I420 Dilated cardiomyopathy: Secondary | ICD-10-CM

## 2022-05-10 LAB — CUP PACEART REMOTE DEVICE CHECK
Battery Remaining Longevity: 30 mo
Battery Remaining Percentage: 35 %
Brady Statistic RV Percent Paced: 0 %
Date Time Interrogation Session: 20231130033400
HighPow Impedance: 76 Ohm
Implantable Lead Connection Status: 753985
Implantable Lead Implant Date: 20121214
Implantable Lead Location: 753860
Implantable Lead Model: 180
Implantable Lead Serial Number: 307189
Implantable Pulse Generator Implant Date: 20121214
Lead Channel Impedance Value: 534 Ohm
Lead Channel Pacing Threshold Amplitude: 0.7 V
Lead Channel Pacing Threshold Pulse Width: 0.4 ms
Lead Channel Setting Pacing Amplitude: 2.4 V
Lead Channel Setting Pacing Pulse Width: 0.4 ms
Lead Channel Setting Sensing Sensitivity: 0.4 mV
Pulse Gen Serial Number: 118994

## 2022-05-11 ENCOUNTER — Other Ambulatory Visit: Payer: Self-pay

## 2022-05-11 MED ORDER — OXYCODONE HCL 5 MG PO TABS: 5.0000 mg | ORAL_TABLET | Freq: Three times a day (TID) | ORAL | 0 refills | Status: DC | PRN

## 2022-05-21 DIAGNOSIS — J90 Pleural effusion, not elsewhere classified: Secondary | ICD-10-CM | POA: Diagnosis not present

## 2022-05-21 DIAGNOSIS — K7460 Unspecified cirrhosis of liver: Secondary | ICD-10-CM | POA: Diagnosis not present

## 2022-05-21 DIAGNOSIS — R69 Illness, unspecified: Secondary | ICD-10-CM | POA: Diagnosis not present

## 2022-05-21 DIAGNOSIS — Z113 Encounter for screening for infections with a predominantly sexual mode of transmission: Secondary | ICD-10-CM | POA: Diagnosis not present

## 2022-05-21 DIAGNOSIS — I5022 Chronic systolic (congestive) heart failure: Secondary | ICD-10-CM | POA: Diagnosis not present

## 2022-05-21 DIAGNOSIS — Z1159 Encounter for screening for other viral diseases: Secondary | ICD-10-CM | POA: Diagnosis not present

## 2022-05-21 DIAGNOSIS — J9811 Atelectasis: Secondary | ICD-10-CM | POA: Diagnosis not present

## 2022-05-21 DIAGNOSIS — I428 Other cardiomyopathies: Secondary | ICD-10-CM | POA: Diagnosis not present

## 2022-05-21 DIAGNOSIS — J811 Chronic pulmonary edema: Secondary | ICD-10-CM | POA: Diagnosis not present

## 2022-05-21 DIAGNOSIS — K219 Gastro-esophageal reflux disease without esophagitis: Secondary | ICD-10-CM | POA: Diagnosis not present

## 2022-05-21 DIAGNOSIS — I517 Cardiomegaly: Secondary | ICD-10-CM | POA: Diagnosis not present

## 2022-05-21 DIAGNOSIS — F32A Depression, unspecified: Secondary | ICD-10-CM | POA: Diagnosis not present

## 2022-05-21 DIAGNOSIS — Z01818 Encounter for other preprocedural examination: Secondary | ICD-10-CM | POA: Diagnosis not present

## 2022-05-21 DIAGNOSIS — Z01810 Encounter for preprocedural cardiovascular examination: Secondary | ICD-10-CM | POA: Diagnosis not present

## 2022-05-21 DIAGNOSIS — R918 Other nonspecific abnormal finding of lung field: Secondary | ICD-10-CM | POA: Diagnosis not present

## 2022-05-21 DIAGNOSIS — Z125 Encounter for screening for malignant neoplasm of prostate: Secondary | ICD-10-CM | POA: Diagnosis not present

## 2022-05-21 DIAGNOSIS — F17210 Nicotine dependence, cigarettes, uncomplicated: Secondary | ICD-10-CM | POA: Diagnosis not present

## 2022-05-21 DIAGNOSIS — I361 Nonrheumatic tricuspid (valve) insufficiency: Secondary | ICD-10-CM | POA: Diagnosis not present

## 2022-05-22 DIAGNOSIS — R911 Solitary pulmonary nodule: Secondary | ICD-10-CM | POA: Insufficient documentation

## 2022-05-22 DIAGNOSIS — R69 Illness, unspecified: Secondary | ICD-10-CM | POA: Diagnosis not present

## 2022-05-22 DIAGNOSIS — Z125 Encounter for screening for malignant neoplasm of prostate: Secondary | ICD-10-CM | POA: Diagnosis not present

## 2022-05-22 DIAGNOSIS — Z01818 Encounter for other preprocedural examination: Secondary | ICD-10-CM | POA: Diagnosis not present

## 2022-05-22 DIAGNOSIS — Z01810 Encounter for preprocedural cardiovascular examination: Secondary | ICD-10-CM | POA: Diagnosis not present

## 2022-05-22 DIAGNOSIS — I509 Heart failure, unspecified: Secondary | ICD-10-CM | POA: Diagnosis not present

## 2022-05-22 DIAGNOSIS — Z79899 Other long term (current) drug therapy: Secondary | ICD-10-CM | POA: Diagnosis not present

## 2022-05-22 DIAGNOSIS — Z1159 Encounter for screening for other viral diseases: Secondary | ICD-10-CM | POA: Diagnosis not present

## 2022-05-23 DIAGNOSIS — I2510 Atherosclerotic heart disease of native coronary artery without angina pectoris: Secondary | ICD-10-CM | POA: Diagnosis not present

## 2022-05-23 DIAGNOSIS — Z7982 Long term (current) use of aspirin: Secondary | ICD-10-CM | POA: Diagnosis not present

## 2022-05-23 DIAGNOSIS — I509 Heart failure, unspecified: Secondary | ICD-10-CM | POA: Diagnosis not present

## 2022-05-23 DIAGNOSIS — Z01818 Encounter for other preprocedural examination: Secondary | ICD-10-CM | POA: Diagnosis not present

## 2022-05-23 DIAGNOSIS — R69 Illness, unspecified: Secondary | ICD-10-CM | POA: Diagnosis not present

## 2022-05-23 DIAGNOSIS — Z7682 Awaiting organ transplant status: Secondary | ICD-10-CM | POA: Insufficient documentation

## 2022-05-23 DIAGNOSIS — Z6821 Body mass index (BMI) 21.0-21.9, adult: Secondary | ICD-10-CM | POA: Diagnosis not present

## 2022-05-23 DIAGNOSIS — Z113 Encounter for screening for infections with a predominantly sexual mode of transmission: Secondary | ICD-10-CM | POA: Diagnosis not present

## 2022-05-23 DIAGNOSIS — Z125 Encounter for screening for malignant neoplasm of prostate: Secondary | ICD-10-CM | POA: Diagnosis not present

## 2022-05-23 DIAGNOSIS — Z713 Dietary counseling and surveillance: Secondary | ICD-10-CM | POA: Diagnosis not present

## 2022-05-23 DIAGNOSIS — Z905 Acquired absence of kidney: Secondary | ICD-10-CM | POA: Diagnosis not present

## 2022-05-23 DIAGNOSIS — J984 Other disorders of lung: Secondary | ICD-10-CM | POA: Diagnosis not present

## 2022-05-23 DIAGNOSIS — Z01810 Encounter for preprocedural cardiovascular examination: Secondary | ICD-10-CM | POA: Diagnosis not present

## 2022-05-23 DIAGNOSIS — I5022 Chronic systolic (congestive) heart failure: Secondary | ICD-10-CM | POA: Diagnosis not present

## 2022-05-23 DIAGNOSIS — Z1159 Encounter for screening for other viral diseases: Secondary | ICD-10-CM | POA: Diagnosis not present

## 2022-05-23 DIAGNOSIS — I428 Other cardiomyopathies: Secondary | ICD-10-CM | POA: Diagnosis not present

## 2022-05-23 DIAGNOSIS — Z79899 Other long term (current) drug therapy: Secondary | ICD-10-CM | POA: Diagnosis not present

## 2022-05-24 DIAGNOSIS — Z01812 Encounter for preprocedural laboratory examination: Secondary | ICD-10-CM | POA: Diagnosis not present

## 2022-05-24 DIAGNOSIS — I361 Nonrheumatic tricuspid (valve) insufficiency: Secondary | ICD-10-CM | POA: Diagnosis not present

## 2022-05-24 DIAGNOSIS — R69 Illness, unspecified: Secondary | ICD-10-CM | POA: Diagnosis not present

## 2022-05-24 DIAGNOSIS — Z01810 Encounter for preprocedural cardiovascular examination: Secondary | ICD-10-CM | POA: Diagnosis not present

## 2022-05-24 DIAGNOSIS — K7460 Unspecified cirrhosis of liver: Secondary | ICD-10-CM | POA: Diagnosis not present

## 2022-05-24 DIAGNOSIS — I5022 Chronic systolic (congestive) heart failure: Secondary | ICD-10-CM | POA: Diagnosis not present

## 2022-05-24 DIAGNOSIS — Z1159 Encounter for screening for other viral diseases: Secondary | ICD-10-CM | POA: Diagnosis not present

## 2022-05-24 DIAGNOSIS — I428 Other cardiomyopathies: Secondary | ICD-10-CM | POA: Diagnosis not present

## 2022-05-24 DIAGNOSIS — I444 Left anterior fascicular block: Secondary | ICD-10-CM | POA: Diagnosis not present

## 2022-05-24 DIAGNOSIS — R9431 Abnormal electrocardiogram [ECG] [EKG]: Secondary | ICD-10-CM | POA: Diagnosis not present

## 2022-05-24 DIAGNOSIS — I493 Ventricular premature depolarization: Secondary | ICD-10-CM | POA: Diagnosis not present

## 2022-05-24 DIAGNOSIS — Z7682 Awaiting organ transplant status: Secondary | ICD-10-CM | POA: Diagnosis not present

## 2022-05-24 DIAGNOSIS — I472 Ventricular tachycardia, unspecified: Secondary | ICD-10-CM | POA: Diagnosis not present

## 2022-05-24 DIAGNOSIS — Z125 Encounter for screening for malignant neoplasm of prostate: Secondary | ICD-10-CM | POA: Diagnosis not present

## 2022-05-25 ENCOUNTER — Ambulatory Visit (INDEPENDENT_AMBULATORY_CARE_PROVIDER_SITE_OTHER): Payer: Medicare HMO | Admitting: Family Medicine

## 2022-05-25 ENCOUNTER — Encounter: Payer: Self-pay | Admitting: Family Medicine

## 2022-05-25 VITALS — BP 124/78 | HR 95 | Ht 67.0 in | Wt 146.0 lb

## 2022-05-25 DIAGNOSIS — Z1211 Encounter for screening for malignant neoplasm of colon: Secondary | ICD-10-CM | POA: Diagnosis not present

## 2022-05-25 DIAGNOSIS — K011 Impacted teeth: Secondary | ICD-10-CM | POA: Diagnosis not present

## 2022-05-25 NOTE — Progress Notes (Signed)
Subjective:    Patient ID: [name], male    DOB: [birth year], 51 y.o.   MRN: [id number]  Dental Pain   Patient has been referred to cardiology at [facility] to discuss a heart transplant.  During this week they did a number of evaluations to determine his appropriateness for heart transplant.  He was having pain in his left upper posterior molar.  He had all of his teeth removed about 15 years ago.  However there is an apparent impaction of the wisdom tooth.  He has an impacted inflamed residual piece of tooth in the left posterior upper molar.  This is tender to palpation.  He is requesting a referral to oral maxillofacial surgeon to have this removed so that he can be cleared for heart transplant.  They also recommended that he be screened for colon cancer.  Cardiology did not want patient to have anesthesia with possible.  Therefore they have recommended a Cologuard.  He is requesting that I order this today Past Medical History:  Diagnosis Date   AICD (automatic cardioverter/defibrillator) present    Arthritis    Cardiomyopathy, nonischemic (HCC)    takes Digoxin daily and Carvedilol daily   CHF (congestive heart failure) ([place])    takes Lasix daily as well Aldactone   Chronic back pain    Chronic systolic heart failure ([place])    Cirrhosis ([place])    Depression    takes Prozac daily   GERD (gastroesophageal reflux disease)    takes Omeprazole daily   Hx of Alcohol consumption heavy    rare beer currently   hx of Tobacco abuse    quit smoking 2014   Hyperlipidemia    was on Simvastatin but has been off a year   Insomnia    takes Ambien as needed(just got script yesterday)   Orthostatic hypotension    Scoliosis    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 64 (XRT and chemo)   Past Surgical History:  Procedure Laterality Date   CARPAL TUNNEL RELEASE Right 01/09/2013   Procedure: CARPAL TUNNEL RELEASE;  Surgeon: Winfield Cunas, MD;  Location: [facility] NEURO [facility]S;  Service: Neurosurgery;  Laterality:  Right;  RIGHT carpal tunnel release   CARPAL TUNNEL RELEASE Left 01/30/2013   Procedure: LEFT CARPAL TUNNEL RELEASE;  Surgeon: Winfield Cunas, MD;  Location: [place] NEURO ORS;  Service: Neurosurgery;  Laterality: Left;  LEFT Carpal Tunnel release   CHEST TUBE INSERTION Right 09/09/2013   Procedure: INSERTION PLEURAL DRAINAGE CATHETER;  Surgeon: Ivin Poot, MD;  Location: [place];  Service: Thoracic;  Laterality: Right;   HYDROCELE EXCISION Right 04/11/2017   Procedure: RIGHT HYDROCELECTOMY ADULT;  Surgeon: Irine Seal, MD;  Location: [facility] ORS;  Service: Urology;  Laterality: Right;   hydrocelectomy  2008   ICD  05/25/2011   [facility] Scientific [employer] SG lead/Energen single chamber device   IMPLANTABLE CARDIOVERTER DEFIBRILLATOR IMPLANT N/A 05/25/2011   Procedure: IMPLANTABLE CARDIOVERTER DEFIBRILLATOR IMPLANT;  Surgeon: Evans Lance, MD;  Location: [facility] CATH LAB;  Service: Cardiovascular;  Laterality: N/A;   NEPHRECTOMY  36 yrs ago   Wilms Tumor   RIGHT HEART CATH N/A 01/29/2019   Procedure: RIGHT HEART CATH;  Surgeon: Jolaine Artist, MD;  Location: [place] CV LAB;  Service: Cardiovascular;  Laterality: N/A;   RIGHT HEART CATH N/A 09/08/2019   Procedure: RIGHT HEART CATH;  Surgeon: Jolaine Artist, MD;  Location: [place] CV LAB;  Service: Cardiovascular;  Laterality: N/A;  RIGHT/LEFT HEART CATH AND CORONARY ANGIOGRAPHY N/A 02/12/2017   Procedure: RIGHT/LEFT HEART CATH AND CORONARY ANGIOGRAPHY;  Surgeon: Jolaine Artist, MD;  Location: [place] CV LAB;  Service: Cardiovascular;  Laterality: N/A;   ULNAR NERVE TRANSPOSITION Right 01/09/2013   Procedure: ULNAR NERVE DECOMPRESSION/TRANSPOSITION;  Surgeon: Winfield Cunas, MD;  Location: [place] NEURO ORS;  Service: Neurosurgery;  Laterality: Right;  RIGHT ulnar nerve decompression   Current Outpatient Medications on File Prior to Visit  Medication Sig Dispense Refill   aspirin 81 MG tablet Take 81 mg by mouth daily.     carvedilol  (COREG) 3.125 MG tablet Take 1 tablet (3.125 mg total) by mouth 2 (two) times daily with a meal. 180 tablet 0   colchicine 0.6 MG tablet 2 tabs immediately and 1 addition tab in 1 hour per day until pain subsides. 30 tablet 1   fluticasone (FLONASE) 50 MCG/ACT nasal spray Place 2 sprays into both nostrils daily. 16 g 6   FLUZONE QUADRIVALENT 0.5 ML injection      metolazone (ZAROXOLYN) 2.5 MG tablet Take 2.5 mg by mouth every 14 (fourteen) days.     oxyCODONE (OXY IR/ROXICODONE) 5 MG immediate release tablet Take 1 tablet (5 mg total) by mouth 3 (three) times daily as needed for severe pain. 60 tablet 0   polyethylene glycol (MIRALAX / GLYCOLAX) 17 g packet Take 17 g by mouth daily as needed for mild constipation. 14 each 0   potassium chloride SA (KLOR-CON M) 20 MEQ tablet Take 2 tablets (40 mEq total) by mouth daily. With an additional 40 meq with every metolazone dose 70 tablet 3   sacubitril-valsartan (ENTRESTO) 49-51 MG Take 1 tablet by mouth 2 (two) times daily. 180 tablet 3   spironolactone (ALDACTONE) 25 MG tablet Take 1 tablet (25 mg total) by mouth daily. 90 tablet 3   torsemide (DEMADEX) 20 MG tablet Take 3 tabs in the morning and 2 tabs in PM 150 tablet 6   No current facility-administered medications on file prior to visit.   Allergies  Allergen Reactions   Ibuprofen Hives   Nsaids     ELEVATED LFT'S   Social History   Socioeconomic History   Marital status: Single    Spouse name: Not on file   Number of children: 1   Years of education: Not on file   Highest education level: Not on file  Occupational History   Occupation: Full time    Comment: Engineer, civil (consulting)  Tobacco Use   Smoking status: Former    Packs/day: 0.25    Years: 20.00    Total pack years: 5.00    Types: Cigarettes    Quit date: 09/07/2012    Years since quitting: 9.7   Smokeless tobacco: Current    Types: Snuff  Vaping Use   Vaping Use: Never used  Substance and Sexual Activity   Alcohol use: Yes     Alcohol/week: 0.0 standard drinks of alcohol    Comment: histoorically a heavy drinker ("beer only"), none for a couple months    Drug use: No   Sexual activity: Not Currently  Other Topics Concern   Not on file  Social History Narrative   Not on file   Social Determinants of Health   Financial Resource Strain: Low Risk  (07/07/2021)   Overall Financial Resource Strain (CARDIA)    Difficulty of Paying Living Expenses: Not hard at all  Food Insecurity: No Food Insecurity (07/07/2021)   Hunger Vital Sign  Worried About Charity fundraiser in the Last Year: Never true    [place] in the Last Year: Never true  Transportation Needs: No Transportation Needs (07/07/2021)   PRAPARE - Hydrologist (Medical): No    Lack of Transportation (Non-Medical): No  Physical Activity: Insufficiently Active (07/07/2021)   Exercise Vital Sign    Days of Exercise per Week: 4 days    Minutes of Exercise per Session: 30 min  Stress: No Stress Concern Present (07/07/2021)   [place]    Feeling of Stress : Only a little  Social Connections: Moderately Integrated (07/07/2021)   Social Connection and Isolation Panel [NHANES]    Frequency of Communication with Friends and Family: More than three times a week    Frequency of Social Gatherings with Friends and Family: More than three times a week    Attends Religious Services: 1 to 4 times per year    Active Member of [employer] or Organizations: Yes    Attends Archivist Meetings: 1 to 4 times per year    Marital Status: Never married  Intimate Partner Violence: Not At Risk (07/07/2021)   Humiliation, Afraid, Rape, and Kick questionnaire    Fear of Current or Ex-Partner: No    Emotionally Abused: No    Physically Abused: No    Sexually Abused: No     Review of Systems  All other systems reviewed and are negative.      Objective:   Physical  Exam Vitals reviewed.  Constitutional:      Appearance: Normal appearance. He is normal weight.  HENT:     Mouth/Throat:     Dentition: Abnormal dentition. Dental tenderness and gingival swelling present.   Cardiovascular:     Rate and Rhythm: Normal rate and regular rhythm.     Heart sounds: Normal heart sounds. No murmur heard. Pulmonary:     Effort: Pulmonary effort is normal. No respiratory distress.     Breath sounds: No wheezing, rhonchi or rales.  Musculoskeletal:        General: No swelling or tenderness.     Right lower leg: No edema.     Left lower leg: No edema.  Skin:    Findings: No erythema.  Neurological:     Mental Status: He is alert.    Patient is missing all of his teeth except for the area circled and read above.  There he has an impacted tooth which appears to be an upper molar that is coming in.  It is partially erupted through the gum.  The surrounding gum is swollen and tender.       Assessment & Plan:  Tooth impaction - Plan: Ambulatory referral to Oral Maxillofacial Surgery  Colon cancer screening - Plan: Cologuard I will refer the patient to oral maxillofacial surgery to have the impacted left posterior upper molar removed.  I will also schedule the patient for Cologuard screen for colon cancer so that he can complete his evaluation for heart transplant.

## 2022-05-30 NOTE — Progress Notes (Signed)
Remote ICD transmission.   

## 2022-06-06 DIAGNOSIS — R188 Other ascites: Secondary | ICD-10-CM | POA: Diagnosis not present

## 2022-06-06 DIAGNOSIS — E278 Other specified disorders of adrenal gland: Secondary | ICD-10-CM | POA: Diagnosis not present

## 2022-06-12 ENCOUNTER — Telehealth: Payer: Self-pay

## 2022-06-12 ENCOUNTER — Other Ambulatory Visit: Payer: Self-pay | Admitting: Family Medicine

## 2022-06-12 MED ORDER — OXYCODONE HCL 5 MG PO TABS: 5.0000 mg | ORAL_TABLET | Freq: Three times a day (TID) | ORAL | 0 refills | Status: DC | PRN

## 2022-06-12 NOTE — Telephone Encounter (Signed)
Pt called requesting a refill on his Oxycodone. Last RF was 05/11/2022. Last OV was 05/25/2022. Pt uses [facility] in [place].

## 2022-06-13 ENCOUNTER — Other Ambulatory Visit: Payer: Self-pay

## 2022-06-13 DIAGNOSIS — Z1211 Encounter for screening for malignant neoplasm of colon: Secondary | ICD-10-CM | POA: Diagnosis not present

## 2022-06-13 DIAGNOSIS — Z79899 Other long term (current) drug therapy: Secondary | ICD-10-CM

## 2022-06-15 ENCOUNTER — Ambulatory Visit (INDEPENDENT_AMBULATORY_CARE_PROVIDER_SITE_OTHER): Payer: Medicare HMO | Admitting: Family Medicine

## 2022-06-15 ENCOUNTER — Encounter: Payer: Self-pay | Admitting: Family Medicine

## 2022-06-15 VITALS — BP 128/60 | HR 83 | Ht 67.0 in | Wt 151.0 lb

## 2022-06-15 DIAGNOSIS — I5022 Chronic systolic (congestive) heart failure: Secondary | ICD-10-CM

## 2022-06-15 DIAGNOSIS — K011 Impacted teeth: Secondary | ICD-10-CM | POA: Diagnosis not present

## 2022-06-15 DIAGNOSIS — E1169 Type 2 diabetes mellitus with other specified complication: Secondary | ICD-10-CM

## 2022-06-15 NOTE — Progress Notes (Signed)
Subjective:    Patient ID: [name], male    DOB: [birth year], 52 y.o.   MRN: [id number]  Dental Pain   05/25/22 Patient has been referred to cardiology at [facility] to discuss a heart transplant.  During this week they did a number of evaluations to determine his appropriateness for heart transplant.  He was having pain in his left upper posterior molar.  He had all of his teeth removed about 15 years ago.  However there is an apparent impaction of the wisdom tooth.  He has an impacted inflamed residual piece of tooth in the left posterior upper molar.  This is tender to palpation.  He is requesting a referral to oral maxillofacial surgeon to have this removed so that he can be cleared for heart transplant.  They also recommended that he be screened for colon cancer.  Cardiology did not want patient to have anesthesia with possible.  Therefore they have recommended a Cologuard.  He is requesting that I order this today  06/15/22 Patient received a call from the transplant team at [facility] stating that on recent CT scan he was found to have a mass in his abdomen.  Before he can be considered for heart transplant, he needs to see an endocrinologist for this.  This is based on his report from the nurse.  Did not send a copy CAT scan.  Informed daughter with acidosis.  However they told they were not concerned about it.  My suspicion is that he may have been found to have an adrenal adenoma.  However I am not certain of this.  He denies any tachycardia.  He denies any hypertension.  He denies any headache.  He denies any recent weight loss or night sweats.  He has yet to hear from oral surgeon.  This is despite having an order placed at his last visit.  He is overdue for lab work regarding his diabetes.  His last A1c was less than 7 but that was a year ago. Past Medical History:  Diagnosis Date   AICD (automatic cardioverter/defibrillator) present    Arthritis    Cardiomyopathy, nonischemic (HCC)    takes  Digoxin daily and Carvedilol daily   CHF (congestive heart failure) (HCC)    takes Lasix daily as well Aldactone   Chronic back pain    Chronic systolic heart failure (HCC)    Cirrhosis (HCC)    Depression    takes Prozac daily   GERD (gastroesophageal reflux disease)    takes Omeprazole daily   Hx of Alcohol consumption heavy    rare beer currently   hx of Tobacco abuse    quit smoking 2014   Hyperlipidemia    was on Simvastatin but has been off a year   Insomnia    takes Ambien as needed(just got script yesterday)   Orthostatic hypotension    Scoliosis    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 29 (XRT and chemo)   Past Surgical History:  Procedure Laterality Date   CARPAL TUNNEL RELEASE Right 01/09/2013   Procedure: CARPAL TUNNEL RELEASE;  Surgeon: Winfield Cunas, MD;  Location: [facility] NEURO ORS;  Service: Neurosurgery;  Laterality: Right;  RIGHT carpal tunnel release   CARPAL TUNNEL RELEASE Left 01/30/2013   Procedure: LEFT CARPAL TUNNEL RELEASE;  Surgeon: Winfield Cunas, MD;  Location: [place] NEURO ORS;  Service: Neurosurgery;  Laterality: Left;  LEFT Carpal Tunnel release   CHEST TUBE INSERTION Right 09/09/2013   Procedure: INSERTION  PLEURAL DRAINAGE CATHETER;  Surgeon: Ivin Poot, MD;  Location: [place];  Service: Thoracic;  Laterality: Right;   HYDROCELE EXCISION Right 04/11/2017   Procedure: RIGHT HYDROCELECTOMY ADULT;  Surgeon: Irine Seal, MD;  Location: [facility];  Service: Urology;  Laterality: Right;   hydrocelectomy  2008   ICD  05/25/2011   [facility] Scientific [employer] SG lead/[employer] single chamber device   IMPLANTABLE CARDIOVERTER DEFIBRILLATOR IMPLANT N/A 05/25/2011   Procedure: IMPLANTABLE CARDIOVERTER DEFIBRILLATOR IMPLANT;  Surgeon: Evans Lance, MD;  Location: [facility] CATH LAB;  Service: Cardiovascular;  Laterality: N/A;   NEPHRECTOMY  36 yrs ago   Wilms Tumor   RIGHT HEART CATH N/A 01/29/2019   Procedure: RIGHT HEART CATH;  Surgeon: Jolaine Artist, MD;  Location: [place] CV LAB;  Service: Cardiovascular;  Laterality: N/A;   RIGHT HEART CATH N/A 09/08/2019   Procedure: RIGHT HEART CATH;  Surgeon: Jolaine Artist, MD;  Location: [place] CV LAB;  Service: Cardiovascular;  Laterality: N/A;   RIGHT/LEFT HEART CATH AND CORONARY ANGIOGRAPHY N/A 02/12/2017   Procedure: RIGHT/LEFT HEART CATH AND CORONARY ANGIOGRAPHY;  Surgeon: Jolaine Artist, MD;  Location: [place] CV LAB;  Service: Cardiovascular;  Laterality: N/A;   ULNAR NERVE TRANSPOSITION Right 01/09/2013   Procedure: ULNAR NERVE DECOMPRESSION/TRANSPOSITION;  Surgeon: Winfield Cunas, MD;  Location: [place] NEURO ORS;  Service: Neurosurgery;  Laterality: Right;  RIGHT ulnar nerve decompression   Current Outpatient Medications on File Prior to Visit  Medication Sig Dispense Refill   aspirin 81 MG tablet Take 81 mg by mouth daily.     carvedilol (COREG) 3.125 MG tablet Take 1 tablet (3.125 mg total) by mouth 2 (two) times daily with a meal. 180 tablet 0   colchicine 0.6 MG tablet 2 tabs immediately and 1 addition tab in 1 hour per day until pain subsides. 30 tablet 1   fluticasone (FLONASE) 50 MCG/ACT nasal spray Place 2 sprays into both nostrils daily. 16 g 6   FLUZONE QUADRIVALENT 0.5 ML injection      metolazone (ZAROXOLYN) 2.5 MG tablet Take 2.5 mg by mouth every 14 (fourteen) days.     oxyCODONE (OXY IR/ROXICODONE) 5 MG immediate release tablet Take 1 tablet (5 mg total) by mouth 3 (three) times daily as needed for severe pain. 60 tablet 0   polyethylene glycol (MIRALAX / GLYCOLAX) 17 g packet Take 17 g by mouth daily as needed for mild constipation. 14 each 0   potassium chloride SA (KLOR-CON M) 20 MEQ tablet Take 2 tablets (40 mEq total) by mouth daily. With an additional 40 meq with every metolazone dose 70 tablet 3   sacubitril-valsartan (ENTRESTO) 49-51 MG Take 1 tablet by mouth 2 (two) times daily. 180 tablet 3   spironolactone (ALDACTONE) 25 MG tablet Take 1 tablet (25 mg total) by mouth  daily. 90 tablet 3   torsemide (DEMADEX) 20 MG tablet Take 3 tabs in the morning and 2 tabs in PM 150 tablet 6   No current facility-administered medications on file prior to visit.   Allergies  Allergen Reactions   Ibuprofen Hives   Nsaids     ELEVATED LFT'S   Social History   Socioeconomic History   Marital status: Single    Spouse name: Not on file   Number of children: 1   Years of education: Not on file   Highest education level: Not on file  Occupational History   Occupation: Full time    Comment: Engineer, civil (consulting)  Tobacco Use   Smoking status: Former    Packs/day: 0.25    Years: 20.00    Total pack years: 5.00    Types: Cigarettes    Quit date: 09/07/2012    Years since quitting: 9.7   Smokeless tobacco: Current    Types: Snuff  Vaping Use   Vaping Use: Never used  Substance and Sexual Activity   Alcohol use: Yes    Alcohol/week: 0.0 standard drinks of alcohol    Comment: histoorically a heavy drinker ("beer only"), none for a couple months    Drug use: No   Sexual activity: Not Currently  Other Topics Concern   Not on file  Social History Narrative   Not on file   Social Determinants of Health   Financial Resource Strain: Low Risk  (07/07/2021)   Overall Financial Resource Strain (CARDIA)    Difficulty of Paying Living Expenses: Not hard at all  Food Insecurity: No Food Insecurity (07/07/2021)   Hunger Vital Sign    Worried About Running Out of Food in the Last Year: Never true    Ran Out of Food in the Last Year: Never true  Transportation Needs: No Transportation Needs (07/07/2021)   PRAPARE - Hydrologist (Medical): No    Lack of Transportation (Non-Medical): No  Physical Activity: Insufficiently Active (07/07/2021)   Exercise Vital Sign    Days of Exercise per Week: 4 days    Minutes of Exercise per Session: 30 min  Stress: No Stress Concern Present (07/07/2021)   [place]    Feeling of Stress : Only a little  Social Connections: Moderately Integrated (07/07/2021)   Social Connection and Isolation Panel [NHANES]    Frequency of Communication with Friends and Family: More than three times a week    Frequency of Social Gatherings with Friends and Family: More than three times a week    Attends Religious Services: 1 to 4 times per year    Active Member of [employer] or Organizations: Yes    Attends Archivist Meetings: 1 to 4 times per year    Marital Status: Never married  Intimate Partner Violence: Not At Risk (07/07/2021)   Humiliation, Afraid, Rape, and Kick questionnaire    Fear of Current or Ex-Partner: No    Emotionally Abused: No    Physically Abused: No    Sexually Abused: No     Review of Systems  All other systems reviewed and are negative.      Objective:   Physical Exam Vitals reviewed.  Constitutional:      Appearance: Normal appearance. He is normal weight.  HENT:     Mouth/Throat:     Dentition: Abnormal dentition. Dental tenderness and gingival swelling present.   Cardiovascular:     Rate and Rhythm: Normal rate and regular rhythm.     Heart sounds: Normal heart sounds. No murmur heard. Pulmonary:     Effort: Pulmonary effort is normal. No respiratory distress.     Breath sounds: No wheezing, rhonchi or rales.  Musculoskeletal:        General: No swelling or tenderness.     Right lower leg: No edema.     Left lower leg: No edema.  Skin:    Findings: No erythema.  Neurological:     Mental Status: He is alert.    Patient is missing all of his teeth except for the area circled and read  above.  There he has an impacted tooth which appears to be an upper molar that is coming in.  It is partially erupted through the gum.  The surrounding gum is swollen and tender.       Assessment & Plan:  Type 2 diabetes mellitus with other specified complication, without long-term current use of insulin (HCC) -  Plan: Hemoglobin A1c, COMPLETE METABOLIC PANEL WITH GFR, Lipid panel, Protein / Creatinine Ratio, Urine  Chronic systolic congestive heart failure (HCC)  Tooth impaction  I will refer the patient to oral maxillofacial surgery to have the impacted left posterior upper molar removed.  I will be happy to consult an endocrinologist however I would like to get records from [facility] to see with the mass in his abdomen close.  Therefore we will call [phone] and try to obtain records regarding what they saw on the CT scan.  Based on his report I suspect it was an adrenal adenoma..  While the patient is here today I will check a hemoglobin A1c, urine protein to creatinine ratio, lipid panel, and CMP.

## 2022-06-16 LAB — COMPLETE METABOLIC PANEL WITH GFR
AG Ratio: 1.4 (calc) (ref 1.0–2.5)
ALT: 27 U/L (ref 9–46)
AST: 33 U/L (ref 10–40)
Albumin: 4 g/dL (ref 3.6–5.1)
Alkaline phosphatase (APISO): 160 U/L — ABNORMAL HIGH (ref 36–130)
BUN/Creatinine Ratio: 30 (calc) — ABNORMAL HIGH (ref 6–22)
BUN: 47 mg/dL — ABNORMAL HIGH (ref 7–25)
CO2: 28 mmol/L (ref 20–32)
Calcium: 9.4 mg/dL (ref 8.6–10.3)
Chloride: 95 mmol/L — ABNORMAL LOW (ref 98–110)
Creat: 1.56 mg/dL — ABNORMAL HIGH (ref 0.60–1.29)
Globulin: 2.8 g/dL (calc) (ref 1.9–3.7)
Glucose, Bld: 166 mg/dL — ABNORMAL HIGH (ref 65–99)
Potassium: 4.5 mmol/L (ref 3.5–5.3)
Sodium: 134 mmol/L — ABNORMAL LOW (ref 135–146)
Total Bilirubin: 0.7 mg/dL (ref 0.2–1.2)
Total Protein: 6.8 g/dL (ref 6.1–8.1)
eGFR: 54 mL/min/{1.73_m2} — ABNORMAL LOW (ref >=60)

## 2022-06-16 LAB — PROTEIN / CREATININE RATIO, URINE
Creatinine, Urine: 29 mg/dL (ref 20–320)
Protein/Creat Ratio: 138 mg/g creat (ref 25–148)
Protein/Creatinine Ratio: 0.138 mg/mg creat (ref 0.025–0.148)
Total Protein, Urine: 4 mg/dL — ABNORMAL LOW (ref 5–25)

## 2022-06-16 LAB — LIPID PANEL
Cholesterol: 200 mg/dL — ABNORMAL HIGH (ref <200)
HDL: 54 mg/dL (ref >=40)
LDL Cholesterol (Calc): 129 mg/dL (calc) — ABNORMAL HIGH
Non-HDL Cholesterol (Calc): 146 mg/dL (calc) — ABNORMAL HIGH (ref <130)
Total CHOL/HDL Ratio: 3.7 (calc) (ref <5.0)
Triglycerides: 71 mg/dL (ref <150)

## 2022-06-16 LAB — HEMOGLOBIN A1C
Hgb A1c MFr Bld: 6.6 % of total Hgb — ABNORMAL HIGH (ref <5.7)
Mean Plasma Glucose: 143 mg/dL
eAG (mmol/L): 7.9 mmol/L

## 2022-06-17 LAB — COLOGUARD: COLOGUARD: POSITIVE — AB

## 2022-06-18 ENCOUNTER — Other Ambulatory Visit: Payer: Self-pay | Admitting: Family Medicine

## 2022-06-18 DIAGNOSIS — R195 Other fecal abnormalities: Secondary | ICD-10-CM

## 2022-06-19 ENCOUNTER — Other Ambulatory Visit: Payer: Self-pay

## 2022-06-19 DIAGNOSIS — E7800 Pure hypercholesterolemia, unspecified: Secondary | ICD-10-CM

## 2022-06-19 MED ORDER — ATORVASTATIN CALCIUM 20 MG PO TABS: 20.0000 mg | ORAL_TABLET | Freq: Every day | ORAL | 3 refills | Status: DC

## 2022-06-21 NOTE — Progress Notes (Signed)
Care Management & Coordination Services Pharmacy Note  06/29/2022 Name:  [name] MRN:  [id number] DOB:  [birth year]  Summary: Initial PharmD visit.  Patient doing well overall monitoring fluid status with CHF.  LDL was elevated but started on statin and tolerating well.  10 year ASCVD risk 6.4% (low risk).  Not using metolazone much due to minimal swelling.  Denies any limitation of activities associated with HF.  Recommendations/Changes made from today's visit: Recheck lipids to see if need to increase intensity after at least 6 weeks of therapy  Follow up plan: FU 6 months CMA to check on HF/statin in 3 months   Subjective: [name] is an 52 y.o. year old male who is a primary patient of Pickard, Cammie Mcgee, MD.  The care coordination team was consulted for assistance with disease management and care coordination needs.    Engaged with patient by telephone for initial visit.  Recent office visits:  06/15/22 Jenna Luo, MD - Family Medicine - Diabetes - Labs were ordered. Referral placed to Endocrinology and Oral surgery. Follow up as scheduled.    05/25/22 Jenna Luo, MD - Family Medicine - Tooth impaction - Referral placed to oral surgery. Cologuard given. Follow up as scheduled.    Recent consult visits:  05/23/22 Holley Raring, MD - Cardiothoracic - CHF - Discussed criteria for transplant coordination. Follow up as scheduled.    05/21/22 Vinnie Langton, MD - Cardiology - Cardiomyopathy - plan to schedule follow-up pending multidisciplinary team discussion    04/30/22 Glori Bickers, MD - Cardiology - CHF - Labs were ordered. sacubitril-valsartan (ENTRESTO) 49-51 MG  prescribed. Follow up as scheduled.    Hospital visits:  None in previous 6 months   Objective:  Lab Results  Component Value Date   CREATININE 1.56 (H) 06/15/2022   BUN 47 (H) 06/15/2022   GFR 72.88 02/24/2019   EGFR 54 (L) 06/15/2022   GFRNONAA >60 04/30/2022   GFRAA >60  09/04/2019   NA 134 (L) 06/15/2022   K 4.5 06/15/2022   CALCIUM 9.4 06/15/2022   CO2 28 06/15/2022   GLUCOSE 166 (H) 06/15/2022    Lab Results  Component Value Date/Time   HGBA1C 6.6 (H) 06/15/2022 08:15 AM   HGBA1C 6.4 (H) 05/30/2021 12:32 PM   GFR 72.88 02/24/2019 12:03 PM   MICROALBUR 2.1 11/08/2020 10:32 AM    Last diabetic Eye exam: No results found for: "HMDIABEYEEXA"  Last diabetic Foot exam: No results found for: "HMDIABFOOTEX"   Lab Results  Component Value Date   CHOL 200 (H) 06/15/2022   HDL 54 06/15/2022   LDLCALC 129 (H) 06/15/2022   TRIG 71 06/15/2022   CHOLHDL 3.7 06/15/2022       Latest Ref Rng & Units 06/15/2022    8:15 AM 04/30/2022    9:32 AM 03/23/2022   12:15 PM  Hepatic Function  Total Protein 6.1 - 8.1 g/dL 6.8  7.4  6.9   Albumin 3.5 - 5.0 g/dL  3.6  3.3   AST 10 - 40 U/L 33  43  35   ALT 9 - 46 U/L '27  29  25   '$ Alk Phosphatase 38 - 126 U/L  229  249   Total Bilirubin 0.2 - 1.2 mg/dL 0.7  1.2  0.9     Lab Results  Component Value Date/Time   TSH 2.221 09/04/2021 04:09 AM   TSH 1.403 07/03/2012 09:55 AM   TSH 1.153 08/20/2011 12:55 PM   FREET4 1.36  11/01/2010 05:54 AM       Latest Ref Rng & Units 09/11/2021    3:03 PM 09/05/2021    5:23 AM 09/04/2021    4:09 AM  CBC  WBC 4.0 - 10.5 K/uL 4.5  3.1  3.5   Hemoglobin 13.0 - 17.0 g/dL 13.8  12.8  13.1   Hematocrit 39.0 - 52.0 % 41.5  37.7  38.5   Platelets 150 - 400 K/uL 382  206  212     Lab Results  Component Value Date/Time   VD25OH 36 11/14/2011 12:29 PM    Clinical ASCVD: No  The 10-year ASCVD risk score (Arnett DK, et al., 2019) is: 6.4%   Values used to calculate the score:     Age: 63 years     Sex: Male     Is Non-Hispanic African American: No     Diabetic: Yes     Tobacco smoker: No     Systolic Blood Pressure: 174 mmHg     Is BP treated: Yes     HDL Cholesterol: 54 mg/dL     Total Cholesterol: 200 mg/dL       06/15/2022    8:07 AM 07/07/2021   10:35 AM 11/08/2020    10:10 AM  Depression screen PHQ 2/9  Decreased Interest 0 0 0  Down, Depressed, Hopeless 0 0 0  PHQ - 2 Score 0 0 0     Social History   Tobacco Use  Smoking Status Former   Packs/day: 0.25   Years: 20.00   Total pack years: 5.00   Types: Cigarettes   Quit date: 09/07/2012   Years since quitting: 9.8  Smokeless Tobacco Current   Types: Snuff   BP Readings from Last 3 Encounters:  06/15/22 128/60  05/25/22 124/78  04/30/22 104/70   Pulse Readings from Last 3 Encounters:  06/15/22 83  05/25/22 95  04/30/22 89   Wt Readings from Last 3 Encounters:  06/15/22 151 lb (68.5 kg)  05/25/22 146 lb (66.2 kg)  04/30/22 146 lb 12.8 oz (66.6 kg)   BMI Readings from Last 3 Encounters:  06/15/22 23.65 kg/m  05/25/22 22.87 kg/m  04/30/22 22.99 kg/m    Allergies  Allergen Reactions   Ibuprofen Hives   Nsaids     ELEVATED LFT'S    Medications Reviewed Today     Reviewed by Chriss Driver, LPN (Licensed Practical Nurse) on 05/25/22 at 1011  Med List Status: <None>   Medication Order Taking? Sig Documenting Provider Last Dose Status Informant  aspirin 81 MG tablet [id number] Yes Take 81 mg by mouth daily. [provider] Taking Active Self, Pharmacy Records           Med Note [facility], HEATHER L   Mon Aug 24, 2013  1:48 PM)     carvedilol (COREG) 3.125 MG tablet [id number] Yes Take 1 tablet (3.125 mg total) by mouth 2 (two) times daily with a meal. [name], Shaune Pascal, MD Taking Active   colchicine 0.6 MG tablet [id number] Yes 2 tabs immediately and 1 addition tab in 1 hour per day until pain subsides. Susy Frizzle, MD Taking Active   fluticasone [facility]) 50 MCG/ACT nasal spray [id number] Yes Place 2 sprays into both nostrils daily. Susy Frizzle, MD Taking Active Self, Pharmacy Records  FLUZONE QUADRIVALENT 0.5 ML injection [id number] Yes  [provider] Taking Active   metolazone (ZAROXOLYN) 2.5 MG tablet [id number] Yes Take 2.5 mg by mouth every  14 (fourteen)  days. [provider] Taking Active   oxyCODONE (OXY IR/ROXICODONE) 5 MG immediate release tablet [id number] Yes Take 1 tablet (5 mg total) by mouth 3 (three) times daily as needed for severe pain. Susy Frizzle, MD Taking Active   polyethylene glycol (MIRALAX / GLYCOLAX) 17 g packet [id number] Yes Take 17 g by mouth daily as needed for mild constipation. Sheikh, Omair Belzoni, DO Taking Active   potassium chloride SA (KLOR-CON M) 20 MEQ tablet [id number] Yes Take 2 tablets (40 mEq total) by mouth daily. With an additional 40 meq with every metolazone dose Joette Catching, PA-C Taking Active   sacubitril-valsartan (ENTRESTO) 49-51 MG [id number] Yes Take 1 tablet by mouth 2 (two) times daily. [name], Shaune Pascal, MD Taking Active   spironolactone (ALDACTONE) 25 MG tablet [id number] Yes Take 1 tablet (25 mg total) by mouth daily. [name], Shaune Pascal, MD Taking Active   torsemide [facility]) 20 MG tablet [id number] Yes Take 3 tabs in the morning and 2 tabs in PM [name], Shaune Pascal, MD Taking Active             SDOH:  (Social Determinants of Health) assessments and interventions performed: Yes Financial Resource Strain: Low Risk  (06/29/2022)   Overall Financial Resource Strain (CARDIA)    Difficulty of Paying Living Expenses: Not very hard   Food Insecurity: No Food Insecurity (06/29/2022)   Hunger Vital Sign    Worried About Running Out of Food in the Last Year: Never true    Ran Out of Food in the Last Year: Never true    SDOH Interventions    Flowsheet Row Clinical Support from 07/07/2021 in [place] Interventions   Food Insecurity Interventions Intervention Not Indicated  Housing Interventions Intervention Not Indicated  Transportation Interventions Intervention Not Indicated  Financial Strain Interventions Intervention Not Indicated  Physical Activity Interventions Intervention Not Indicated  Stress Interventions Intervention Not  Indicated  Social Connections Interventions Intervention Not Indicated      SDOH Screenings   Food Insecurity: No Food Insecurity (06/29/2022)  Housing: Medium Risk (07/07/2021)  Transportation Needs: No Transportation Needs (07/07/2021)  Alcohol Screen: Low Risk  (07/07/2021)  Depression (PHQ2-9): Low Risk  (06/15/2022)  Financial Resource Strain: Low Risk  (06/29/2022)  Physical Activity: Insufficiently Active (07/07/2021)  Social Connections: Moderately Integrated (07/07/2021)  Stress: No Stress Concern Present (07/07/2021)  Tobacco Use: High Risk (06/15/2022)    Medication Assistance: None required.  Patient affirms current coverage meets needs.  Medication Access: Within the past 30 days, how often has patient missed a dose of medication? 0 Is a pillbox or other method used to improve adherence? Yes  Factors that may affect medication adherence? no barriers identified Are meds synced by current pharmacy? No  Are meds delivered by current pharmacy? No  Does patient experience delays in picking up medications due to transportation concerns? No   [facility] Services Reviewed: Is patient disadvantaged to use [facility]?: Yes  Current Rx insurance plan: [employer] Name and location of Current pharmacy:  [place] [address], [place] [address] Phone: [phone] Fax: [phone]  [facility] services reviewed with patient today?: Yes  Patient requests to transfer care to [facility]?: No  Reason patient declined to change pharmacies: Disadvantaged due to insurance/mail order  Compliance/Adherence/Medication fill history: Care Gaps: AWV due  Star-Rating Drugs: Atorvastatin '20mg'$  06/19/22 90ds  Assessment/Plan Hyperlipidemia: (LDL goal < 70) -Uncontrolled -Current treatment: Atorvastatin '20mg'$   Appropriate, Query Effective -Medications previously tried: none noted  -Current dietary patterns: tried to eat  good, eats about what he wants -Current exercise habits: very active lifestyle -Educated on Cholesterol goals;  Benefits of statin for ASCVD risk reduction; Importance of limiting foods high in cholesterol; -Recommended to continue current medication Recommend lipid panel at 6 weeks to track efficacy 10 year ASCVD risk 6.4% - would recommend increase to high intensity stating if LDL not at goal at next FU.  Diabetes (A1c goal <7%) -Controlled -Current medications: None -Medications previously tried: none noted  -Current home glucose readings fasting glucose: not checking post prandial glucose: not checking -Denies hypoglycemic/hyperglycemic symptoms  -Educated on A1c and blood sugar goals; Prevention and management of hypoglycemic episodes; Benefits of routine self-monitoring of blood sugar; -Counseled to check feet daily and get yearly eye exams -Recommended to continue current medication For now just focus on keeping A1c < 7.0.  No medications needed at this time.  Continue routine A1c screens.  Heart Failure (Goal: manage symptoms and prevent exacerbations) -Controlled -Last ejection fraction: < 20% (Date: 09/04/21) -HF type: HFrEF (EF < 40%) -NYHA Class: I (no actitivty limitation) -AHA HF Stage: B (Heart disease present - no symptoms present) -Current treatment: Carvedilol 3.'125mg'$  Bid Appropriate, Effective, Safe, Accessible Entresto 49-'51mg'$  BID Appropriate, Effective, Safe, Accessible Torsemide '20mg'$  3 tabs AM and 2 tabs pm Appropriate, Effective, Safe, Accessible Metolazone 2.'5mg'$  prn Appropriate, Effective, Safe, Accessible Spironolactone '25mg'$  Appropriate, Effective, Safe, Accessible Potassium 67mq daily Appropriate, Effective, Safe, Accessible -Medications previously tried: none noted  -Current home BP/HR readings: well controlled -Current home daily weights:  no weighing, but monitors fluid based on swelling -Current dietary habits: see above -Current exercise  habits: see above -Educated on Benefits of medications for managing symptoms and prolonging life Importance of weighing daily; if you gain more than 3 pounds in one day or 5 pounds in one week, contact prescribers Proper diuretic administration and potassium supplementation Importance of blood pressure control -Recommended to continue current medication   CBeverly Milch PharmD, CPP Clinical Pharmacist Practitioner [place]([phone]

## 2022-06-27 ENCOUNTER — Telehealth: Payer: Self-pay | Admitting: Pharmacist

## 2022-06-27 NOTE — Progress Notes (Signed)
Care Management & Coordination Services Pharmacy Team  Reason for Encounter: Appointment Reminder  Contacted patient on 06/27/2022   Recent office visits:  06/15/22 Jenna Luo, MD - Family Medicine - Diabetes - Labs were ordered. Referral placed to Endocrinology and Oral surgery. Follow up as scheduled.   05/25/22 Jenna Luo, MD - Family Medicine - Tooth impaction - Referral placed to oral surgery. Cologuard given. Follow up as scheduled.   Recent consult visits:  05/23/22 Holley Raring, MD - Cardiothoracic - CHF - Discussed criteria for transplant coordination. Follow up as scheduled.   05/21/22 Vinnie Langton, MD - Cardiology - Cardiomyopathy - plan to schedule follow-up pending multidisciplinary team discussion   04/30/22 Glori Bickers, MD - Cardiology - CHF - Labs were ordered. sacubitril-valsartan (ENTRESTO) 49-51 MG  prescribed. Follow up as scheduled.   Hospital visits:  None in previous 6 months  Medications: Outpatient Encounter Medications as of 06/27/2022  Medication Sig   atorvastatin (LIPITOR) 20 MG tablet Take 1 tablet (20 mg total) by mouth daily.   aspirin 81 MG tablet Take 81 mg by mouth daily.   carvedilol (COREG) 3.125 MG tablet Take 1 tablet (3.125 mg total) by mouth 2 (two) times daily with a meal.   colchicine 0.6 MG tablet 2 tabs immediately and 1 addition tab in 1 hour per day until pain subsides.   fluticasone (FLONASE) 50 MCG/ACT nasal spray Place 2 sprays into both nostrils daily.   FLUZONE QUADRIVALENT 0.5 ML injection    metolazone (ZAROXOLYN) 2.5 MG tablet Take 2.5 mg by mouth every 14 (fourteen) days.   oxyCODONE (OXY IR/ROXICODONE) 5 MG immediate release tablet Take 1 tablet (5 mg total) by mouth 3 (three) times daily as needed for severe pain.   polyethylene glycol (MIRALAX / GLYCOLAX) 17 g packet Take 17 g by mouth daily as needed for mild constipation.   potassium chloride SA (KLOR-CON M) 20 MEQ tablet Take 2 tablets (40 mEq total) by  mouth daily. With an additional 40 meq with every metolazone dose   sacubitril-valsartan (ENTRESTO) 49-51 MG Take 1 tablet by mouth 2 (two) times daily.   spironolactone (ALDACTONE) 25 MG tablet Take 1 tablet (25 mg total) by mouth daily.   torsemide (DEMADEX) 20 MG tablet Take 3 tabs in the morning and 2 tabs in PM   No facility-administered encounter medications on file as of 06/27/2022.   Lab Results  Component Value Date/Time   HGBA1C 6.6 (H) 06/15/2022 08:15 AM   HGBA1C 6.4 (H) 05/30/2021 12:32 PM   MICROALBUR 2.1 11/08/2020 10:32 AM    BP Readings from Last 3 Encounters:  06/15/22 128/60  05/25/22 124/78  04/30/22 104/70      Patient contacted to confirm telephone appointment with Leata Mouse PharmD, on 06/28/22 at 10:00am.  Do you have any problems getting your medications? No If yes what types of problems are you experiencing? N/A  What is your top health concern you would like to discuss at your upcoming visit?  Patient did not have anything to discuss at the time of the call.   Have you seen any other providers since your last visit with PCP? No    Star Rating Drugs:  Medication:    Last Fill: [facility] Supply sacubitril-valsartan49-51 MG   11/220/23 90 atorvastatin 20 MG tablet   06/19/22  90  Care Gaps: Annual wellness visit in last year? Yes 07/07/21  If Diabetic: Last eye exam / retinopathy screening: 06/25/21 Last diabetic foot exam:06/15/22   Future Appointments  Date Time Provider [place]  06/28/2022 10:00 AM Edythe Clarity,  [facility] None  07/13/2022 10:30 AM [facility]-NURSE HEALTH ADVISOR [facility]  08/01/2022  2:00 PM Brita Romp, NP [facility] None  08/09/2022  7:20 AM [facility] DEVICE REMOTES [facility]  11/08/2022  7:20 AM [facility] DEVICE REMOTES [facility]  02/07/2023  7:20 AM [facility] DEVICE REMOTES [facility]  08/08/2023  7:20 AM [facility] DEVICE REMOTES [facility]  11/07/2023   7:20 AM [facility] DEVICE REMOTES [facility]

## 2022-06-28 ENCOUNTER — Ambulatory Visit: Payer: Medicare HMO | Admitting: Pharmacist

## 2022-07-11 DIAGNOSIS — Z01818 Encounter for other preprocedural examination: Secondary | ICD-10-CM | POA: Diagnosis not present

## 2022-07-11 DIAGNOSIS — I5022 Chronic systolic (congestive) heart failure: Secondary | ICD-10-CM | POA: Diagnosis not present

## 2022-07-11 DIAGNOSIS — I361 Nonrheumatic tricuspid (valve) insufficiency: Secondary | ICD-10-CM | POA: Diagnosis not present

## 2022-07-11 DIAGNOSIS — I428 Other cardiomyopathies: Secondary | ICD-10-CM | POA: Diagnosis not present

## 2022-07-13 ENCOUNTER — Telehealth: Payer: Self-pay

## 2022-07-13 ENCOUNTER — Ambulatory Visit (INDEPENDENT_AMBULATORY_CARE_PROVIDER_SITE_OTHER): Payer: Medicare HMO

## 2022-07-13 ENCOUNTER — Other Ambulatory Visit: Payer: Self-pay | Admitting: Family Medicine

## 2022-07-13 DIAGNOSIS — Z0000 Encounter for general adult medical examination without abnormal findings: Secondary | ICD-10-CM

## 2022-07-13 MED ORDER — OXYCODONE HCL 5 MG PO TABS: 5.0000 mg | ORAL_TABLET | Freq: Three times a day (TID) | ORAL | 0 refills | Status: DC | PRN

## 2022-07-13 NOTE — Telephone Encounter (Signed)
Pt called for refill on Oxycodone. Last RF 06/15/2022.  Pt uses [facility] in [place].

## 2022-07-13 NOTE — Progress Notes (Signed)
Subjective:   [name] is a 52 y.o. male who presents for Medicare Annual/Subsequent preventive examination.  Review of Systems     Cardiac Risk Factors include: male gender;sedentary lifestyle;dyslipidemia     Objective:    There were no vitals filed for this visit. There is no height or weight on file to calculate BMI.     07/13/2022    9:59 AM 09/03/2021    9:57 AM 07/07/2021   10:38 AM 09/08/2019   12:42 PM 03/30/2019   10:27 AM 01/29/2019   12:19 PM 03/05/2018    3:04 PM  Advanced Directives  Does Patient Have a Medical Advance Directive? Yes No No No No No No  Type of Advance Directive Healthcare Power of Attorney        Would patient like information on creating a medical advance directive?  No - Patient declined No - Patient declined No - Patient declined No - Patient declined No - Patient declined No - Patient declined    Current Medications (verified) Outpatient Encounter Medications as of 07/13/2022  Medication Sig   aspirin 81 MG tablet Take 81 mg by mouth daily.   atorvastatin (LIPITOR) 20 MG tablet Take 1 tablet (20 mg total) by mouth daily.   carvedilol (COREG) 3.125 MG tablet Take 1 tablet (3.125 mg total) by mouth 2 (two) times daily with a meal.   colchicine 0.6 MG tablet 2 tabs immediately and 1 addition tab in 1 hour per day until pain subsides.   fluticasone (FLONASE) 50 MCG/ACT nasal spray Place 2 sprays into both nostrils daily.   FLUZONE QUADRIVALENT 0.5 ML injection    metolazone (ZAROXOLYN) 2.5 MG tablet Take 2.5 mg by mouth every 14 (fourteen) days.   oxyCODONE (OXY IR/ROXICODONE) 5 MG immediate release tablet Take 1 tablet (5 mg total) by mouth 3 (three) times daily as needed for severe pain.   polyethylene glycol (MIRALAX / GLYCOLAX) 17 g packet Take 17 g by mouth daily as needed for mild constipation.   potassium chloride SA (KLOR-CON M) 20 MEQ tablet Take 2 tablets (40 mEq total) by mouth daily. With an additional 40 meq with every metolazone  dose   sacubitril-valsartan (ENTRESTO) 49-51 MG Take 1 tablet by mouth 2 (two) times daily.   spironolactone (ALDACTONE) 25 MG tablet Take 1 tablet (25 mg total) by mouth daily.   torsemide (DEMADEX) 20 MG tablet Take 3 tabs in the morning and 2 tabs in PM   No facility-administered encounter medications on file as of 07/13/2022.    Allergies (verified) Ibuprofen and Nsaids   History: Past Medical History:  Diagnosis Date   AICD (automatic cardioverter/defibrillator) present    Arthritis    Cardiomyopathy, nonischemic (HCC)    takes Digoxin daily and Carvedilol daily   CHF (congestive heart failure) (HCC)    takes Lasix daily as well Aldactone   Chronic back pain    Chronic systolic heart failure (HCC)    Cirrhosis ([place])    Depression    takes Prozac daily   GERD (gastroesophageal reflux disease)    takes Omeprazole daily   Hx of Alcohol consumption heavy    rare beer currently   hx of Tobacco abuse    quit smoking 2014   Hyperlipidemia    was on Simvastatin but has been off a year   Insomnia    takes Ambien as needed(just got script yesterday)   Orthostatic hypotension    Scoliosis    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)   Past Surgical History:  Procedure Laterality Date   CARPAL TUNNEL RELEASE Right 01/09/2013   Procedure: CARPAL TUNNEL RELEASE;  Surgeon: Winfield Cunas, MD;  Location: [place];  Service: Neurosurgery;  Laterality: Right;  RIGHT carpal tunnel release   CARPAL TUNNEL RELEASE Left 01/30/2013   Procedure: LEFT CARPAL TUNNEL RELEASE;  Surgeon: Winfield Cunas, MD;  Location: [facility];  Service: Neurosurgery;  Laterality: Left;  LEFT Carpal Tunnel release   CHEST TUBE INSERTION Right 09/09/2013   Procedure: INSERTION PLEURAL DRAINAGE CATHETER;  Surgeon: Ivin Poot, MD;  Location: [facility];  Service: Thoracic;  Laterality: Right;   HYDROCELE EXCISION Right 04/11/2017   Procedure: RIGHT HYDROCELECTOMY ADULT;  Surgeon: Irine Seal, MD;   Location: [facility];  Service: Urology;  Laterality: Right;   hydrocelectomy  2008   ICD  05/25/2011   [facility] Scientific [employer] SG lead/Energen single chamber device   IMPLANTABLE CARDIOVERTER DEFIBRILLATOR IMPLANT N/A 05/25/2011   Procedure: IMPLANTABLE CARDIOVERTER DEFIBRILLATOR IMPLANT;  Surgeon: Evans Lance, MD;  Location: [facility] C[facility];  Service: Cardiovascular;  Laterality: N/A;   NEPHRECTOMY  36 yrs ago   Wilms Tumor   RIGHT HEART CATH N/A 01/29/2019   Procedure: RIGHT HEART CATH;  Surgeon: Jolaine Artist, MD;  Location: [facility];  Service: Cardiovascular;  Laterality: N/A;   RIGHT HEART CATH N/A 09/08/2019   Procedure: RIGHT HEART CATH;  Surgeon: Jolaine Artist, MD;  Location: [place];  Service: Cardiovascular;  Laterality: N/A;   RIGHT/LEFT HEART CATH AND CORONARY ANGIOGRAPHY N/A 02/12/2017   Procedure: RIGHT/LEFT HEART CATH AND CORONARY ANGIOGRAPHY;  Surgeon: Jolaine Artist, MD;  Location: [place] [facility];  Service: Cardiovascular;  Laterality: N/A;   ULNAR NERVE TRANSPOSITION Right 01/09/2013   Procedure: ULNAR NERVE DECOMPRESSION/TRANSPOSITION;  Surgeon: Winfield Cunas, MD;  Location: [place] [facility];  Service: Neurosurgery;  Laterality: Right;  RIGHT ulnar nerve decompression   Family History  Problem Relation Age of Onset   Diabetes Mother    Social History   Socioeconomic History   Marital status: Single    Spouse name: Not on file   Number of children: 1   Years of education: Not on file   Highest education level: Not on file  Occupational History   Occupation: Full time    Comment: Engineer, civil (consulting)  Tobacco Use   Smoking status: Former    Packs/day: 0.25    Years: 20.00    Total pack years: 5.00    Types: Cigarettes    Quit date: 09/07/2012    Years since quitting: 9.8   Smokeless tobacco: Current    Types: Snuff  Vaping Use   Vaping Use: Never used  Substance and Sexual Activity   Alcohol use: Yes    Alcohol/week:  0.0 standard drinks of alcohol    Comment: histoorically a heavy drinker ("beer only"), none for a couple months    Drug use: No   Sexual activity: Not Currently  Other Topics Concern   Not on file  Social History Narrative   Not on file   Social Determinants of Health   Financial Resource Strain: Low Risk  (07/13/2022)   Overall Financial Resource Strain (CARDIA)    Difficulty of Paying Living Expenses: Not hard at all  Food Insecurity: No Food Insecurity (07/13/2022)   Hunger Vital Sign    Worried About Running Out of Food in the Last Year:  Never true    Ran Out of Food in the Last Year: Never true  Transportation Needs: No Transportation Needs (07/13/2022)   PRAPARE - Hydrologist (Medical): No    Lack of Transportation (Non-Medical): No  Physical Activity: Inactive (07/13/2022)   Exercise Vital Sign    Days of Exercise per Week: 0 days    Minutes of Exercise per Session: 0 min  Stress: No Stress Concern Present (07/13/2022)   [place]    Feeling of Stress : Not at all  Social Connections: Moderately Isolated (07/13/2022)   Social Connection and Isolation Panel [NHANES]    Frequency of Communication with Friends and Family: Three times a week    Frequency of Social Gatherings with Friends and Family: Three times a week    Attends Religious Services: Never    Active Member of Clubs or Organizations: Yes    Attends Archivist Meetings: Never    Marital Status: Never married    Tobacco Counseling Ready to quit: Not Answered Counseling given: Not Answered   Clinical Intake:  Pre-visit preparation completed: Yes  Pain : No/denies pain     BMI - recorded: 23.6 Nutritional Status: BMI of 19-24  Normal Diabetes: No (per pt boarder line)  How often do you need to have someone help you when you read instructions, pamphlets, or other written materials from your doctor or  pharmacy?: 1 - Never What is the last grade level you completed in school?: 11  Diabetic?no per pt  Interpreter Needed?: No      Activities of Daily Living    07/13/2022    9:59 AM 09/04/2021   11:00 PM  In your present state of health, do you have any difficulty performing the following activities:  Hearing? 0   Vision? 0   Difficulty concentrating or making decisions? 0   Walking or climbing stairs? 0   Dressing or bathing? 0   Doing errands, shopping? 0 0  Preparing Food and eating ? N   Using the Toilet? N   In the past six months, have you accidently leaked urine? N   Do you have problems with loss of bowel control? N   Managing your Medications? N   Managing your Finances? N   Housekeeping or managing your Housekeeping? N     Patient Care Team: Susy Frizzle, MD as PCP - General (Family Medicine) [name], Shaune Pascal, MD as PCP - Cardiology (Cardiology) Evans Lance, MD as Consulting Physician (Cardiology)  Indicate any recent Medical Services you may have received from other than [facility] providers in the past year (date may be approximate).     Assessment:   This is a routine wellness examination for [name].  Hearing/Vision screen No results found.  Dietary issues and exercise activities discussed: Exercise limited by: None identified   Goals Addressed             This Visit's Progress    Exercise 3x per week (30 min per time)   On track    Keep exercising and staying healthy.       Depression Screen    07/13/2022    9:58 AM 06/15/2022    8:07 AM 07/07/2021   10:35 AM 11/08/2020   10:10 AM 07/11/2020    2:07 PM 04/08/2020   10:17 AM 09/17/2019    9:37 AM  PHQ 2/9 Scores  PHQ - 2 Score 0 0 0  0 0 0 0    Fall Risk    06/15/2022    8:07 AM 07/07/2021   10:38 AM 11/08/2020   10:10 AM 07/11/2020    2:07 PM 04/08/2020   10:17 AM  Fall Risk   Falls in the past year? 0 0 1 1   Number falls in past yr: 0 0 0 0 0  Comment    slpped on ice   Injury  with Fall? 0 0 0 0 0  Risk for fall due to : No Fall Risks No Fall Risks No Fall Risks No Fall Risks   Follow up Falls prevention discussed Falls prevention discussed Falls evaluation completed Falls evaluation completed Falls evaluation completed    [place]:  Any stairs in or around the home? Yes  If so, are there any without handrails? Yes  Home free of loose throw rugs in walkways, pet beds, electrical cords, etc? Yes  Adequate lighting in your home to reduce risk of falls? Yes   ASSISTIVE DEVICES UTILIZED TO PREVENT FALLS:  Life alert? No  Use of a cane, walker or w/c?  N/a Grab bars in the bathroom? No  Shower chair or bench in shower? No  Elevated toilet seat or a handicapped toilet? No   TIMED UP AND GO:  Was the test performed?  n/a .  Length of time to ambulate 10 feet: n/a sec.     Cognitive Function:        07/13/2022   10:01 AM 07/07/2021   10:40 AM 03/05/2018    3:07 PM  6CIT Screen  What Year? 0 points 0 points 0 points  What month? 0 points 0 points 0 points  What time? 0 points 0 points   Count back from 20 0 points 0 points 0 points  Months in reverse 0 points 0 points 2 points  Repeat phrase 0 points 0 points 2 points  Total Score 0 points 0 points     Immunizations Immunization History  Administered Date(s) Administered   Hep A / Hep B 03/30/2019, 02/18/2020, 03/21/2020, 08/22/2020   Influenza Split 05/26/2011, 02/29/2012   Influenza,inj,Quad PF,6+ Mos 03/16/2014, 03/18/2015, 04/25/2016, 02/25/2017, 03/05/2018, 03/30/2019, 04/08/2020, 05/30/2021   Influenza-Unspecified 03/16/2014, 03/18/2015, 04/25/2016, 02/25/2017, 03/05/2018, 03/30/2019, 02/23/2022   Moderna Sars-Covid-2 Vaccination 08/16/2019, 09/23/2019, 05/23/2020   Pneumococcal Polysaccharide-23 11/01/2010   Tdap 02/25/2017    TDAP status: Up to date  Flu Vaccine status: Up to date  Pneumococcal vaccine status: Up to date  Covid-19 vaccine status:  Declined, Education has been provided regarding the importance of this vaccine but patient still declined. Advised may receive this vaccine at local pharmacy or Health Dept.or vaccine clinic. Aware to provide a copy of the vaccination record if obtained from local pharmacy or Health Dept. Verbalized acceptance and understanding.  Qualifies for Shingles Vaccine?  No due to age   Zostavax completed No   Shingrix Completed?: No.    Education has been provided regarding the importance of this vaccine. Patient has been advised to call insurance company to determine out of pocket expense if they have not yet received this vaccine. Advised may also receive vaccine at local pharmacy or Health Dept. Verbalized acceptance and understanding.  Screening Tests Health Maintenance  Topic Date Due   COVID-19 Vaccine (4 - 2023-24 season) 02/09/2022   COLONOSCOPY (Pts 45-57yr Insurance coverage will need to be confirmed)  06/16/2023 (Originally 04/19/2018)   HEMOGLOBIN A1C  12/14/2022   OPHTHALMOLOGY EXAM  04/26/2023   Diabetic kidney evaluation - eGFR measurement  06/16/2023   Diabetic kidney evaluation - Urine ACR  06/16/2023   FOOT EXAM  06/16/2023   Medicare Annual Wellness (AWV)  07/14/2023   DTaP/Tdap/Td (2 - Td or Tdap) 02/26/2027   INFLUENZA VACCINE  Completed   Hepatitis C Screening  Completed   HIV Screening  Completed   HPV VACCINES  Aged Out    Health Maintenance  Health Maintenance Due  Topic Date Due   COVID-19 Vaccine (4 - 2023-24 season) 02/09/2022    Colorectal cancer screening: Referral to GI placed per pt never rec' call to make appt. Told pt will f/u w/ referrals and call pt back. Pt aware the office will call re: appt.  Lung Cancer Screening: (Low Dose CT Chest recommended if Age 89-80 years, 30 pack-year currently smoking OR have quit w/in 15years.) does not qualify.   Lung Cancer Screening Referral: n/a  Additional Screening:  Hepatitis C Screening: does not qualify;  Completed 9/20  Vision Screening: Recommended annual ophthalmology exams for early detection of glaucoma and other disorders of the eye. Is the patient up to date with their annual eye exam?  Yes  Who is the provider or what is the name of the office in which the patient attends annual eye exams? Dr. Bridgett Larsson in [place] If pt is not established with a provider, would they like to be referred to a provider to establish care? No .   Dental Screening: Recommended annual dental exams for proper oral hygiene  Community Resource Referral / Chronic Care Management: CRR required this visit?  No   CCM required this visit?  No      Plan:     I have personally reviewed and noted the following in the patient's chart:   Medical and social history Use of alcohol, tobacco or illicit drugs  Current medications and supplements including opioid prescriptions. Patient is not currently taking opioid prescriptions. Functional ability and status Nutritional status Physical activity Advanced directives List of other physicians Hospitalizations, surgeries, and ER visits in previous 12 months Vitals Screenings to include cognitive, depression, and falls Referrals and appointments  In addition, I have reviewed and discussed with patient certain preventive protocols, quality metrics, and best practice recommendations. A written personalized care plan for preventive services as well as general preventive health recommendations were provided to patient.   Virtual Visit via Telephone Note  I connected with [name] on 07/13/22 at 10:30 AM EST by telephone and verified that I am speaking with the correct person using two identifiers.  Location: Patient: home Provider: clinic office   I discussed the limitations, risks, security and privacy concerns of performing an evaluation and management service by telephone and the availability of in person appointments. I also discussed with the patient that  there may be a patient responsible charge related to this service. The patient expressed understanding and agreed to proceed.   History of Present Illness:    Observations/Objective:   Assessment and Plan:   Follow Up Instructions:    I discussed the assessment and treatment plan with the patient. The patient was provided an opportunity to ask questions and all were answered. The patient agreed with the plan and demonstrated an understanding of the instructions.   The patient was advised to call back or seek an in-person evaluation if the symptoms worsen or if the condition fails to improve as anticipated.  I provided 30 minutes of non-face-to-face time during this encounter.  Colman Cater, [place], [place]   07/13/2022   Nurse Notes: Told pt we'll f/u on his colonoscopy and will call him back.

## 2022-07-17 ENCOUNTER — Other Ambulatory Visit: Payer: Self-pay | Admitting: Family Medicine

## 2022-07-20 DIAGNOSIS — Z7682 Awaiting organ transplant status: Secondary | ICD-10-CM | POA: Diagnosis not present

## 2022-07-20 DIAGNOSIS — Z01818 Encounter for other preprocedural examination: Secondary | ICD-10-CM | POA: Diagnosis not present

## 2022-08-01 ENCOUNTER — Encounter: Payer: Self-pay | Admitting: "Endocrinology

## 2022-08-01 ENCOUNTER — Ambulatory Visit: Payer: Medicare HMO | Admitting: Nurse Practitioner

## 2022-08-01 ENCOUNTER — Ambulatory Visit (INDEPENDENT_AMBULATORY_CARE_PROVIDER_SITE_OTHER): Payer: Medicare HMO | Admitting: "Endocrinology

## 2022-08-01 VITALS — BP 82/54 | HR 88 | Ht 67.0 in | Wt 152.2 lb

## 2022-08-01 DIAGNOSIS — E278 Other specified disorders of adrenal gland: Secondary | ICD-10-CM | POA: Diagnosis not present

## 2022-08-01 NOTE — Progress Notes (Signed)
Endocrinology Consult Note                                            08/01/2022, 3:34 PM   Subjective:    Patient ID: [name], male    DOB: 02-28-1971, PCP Susy Frizzle, MD   Past Medical History:  Diagnosis Date   AICD (automatic cardioverter/defibrillator) present    Arthritis    Cardiomyopathy, nonischemic ([place])    takes Digoxin daily and Carvedilol daily   CHF (congestive heart failure) ([place])    takes Lasix daily as well Aldactone   Chronic back pain    Chronic systolic heart failure ([place])    Cirrhosis ([place])    Depression    takes Prozac daily   GERD (gastroesophageal reflux disease)    takes Omeprazole daily   Hx of Alcohol consumption heavy    rare beer currently   hx of Tobacco abuse    quit smoking 2014   Hyperlipidemia    was on Simvastatin but has been off a year   Insomnia    takes Ambien as needed(just got script yesterday)   Orthostatic hypotension    Scoliosis    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 15 (XRT and chemo)   Past Surgical History:  Procedure Laterality Date   CARPAL TUNNEL RELEASE Right 01/09/2013   Procedure: CARPAL TUNNEL RELEASE;  Surgeon: Winfield Cunas, MD;  Location: [place] [facility];  Service: Neurosurgery;  Laterality: Right;  RIGHT carpal tunnel release   CARPAL TUNNEL RELEASE Left 01/30/2013   Procedure: LEFT CARPAL TUNNEL RELEASE;  Surgeon: Winfield Cunas, MD;  Location: [place] [facility];  Service: Neurosurgery;  Laterality: Left;  LEFT Carpal Tunnel release   CHEST TUBE INSERTION Right 09/09/2013   Procedure: INSERTION PLEURAL DRAINAGE CATHETER;  Surgeon: Ivin Poot, MD;  Location: [place];  Service: Thoracic;  Laterality: Right;   HYDROCELE EXCISION Right 04/11/2017   Procedure: RIGHT HYDROCELECTOMY ADULT;  Surgeon: Irine Seal, MD;  Location: [facility];  Service: Urology;  Laterality: Right;   hydrocelectomy  2008   ICD  05/25/2011   [facility] Scientific [employer] SG lead/[employer]ner[employer] single chamber device   IMPLANTABLE  CARDIOVERTER DEFIBRILLATOR IMPLANT N/A 05/25/2011   Procedure: IMPLANTABLE CARDIOVERTER DEFIBRILLATOR IMPLANT;  Surgeon: Evans Lance, MD;  Location: [facility] CATH LAB;  Service: Cardiovascular;  Laterality: N/A;   NEPHRECTOMY  36 yrs ago   Wilms Tumor   RIGHT HEART CATH N/A 01/29/2019   Procedure: RIGHT HEART CATH;  Surgeon: Jolaine Artist, MD;  Location: [place] CV LAB;  Service: Cardiovascular;  Laterality: N/A;   RIGHT HEART CATH N/A 09/08/2019   Procedure: RIGHT HEART CATH;  Surgeon: Jolaine Artist, MD;  Location: [place] CV LAB;  Service: Cardiovascular;  Laterality: N/A;   RIGHT/LEFT HEART CATH AND CORONARY ANGIOGRAPHY N/A 02/12/2017   Procedure: RIGHT/LEFT HEART CATH AND CORONARY ANGIOGRAPHY;  Surgeon: Jolaine Artist, MD;  Location: [place] CV LAB;  Service: Cardiovascular;  Laterality: N/A;   ULNAR NERVE TRANSPOSITION Right 01/09/2013   Procedure: ULNAR NERVE DECOMPRESSION/TRANSPOSITION;  Surgeon: Winfield Cunas, MD;  Location: [place] [facility];  Service: Neurosurgery;  Laterality: Right;  RIGHT ulnar nerve decompression   Social History   Socioeconomic History   Marital status: Single    Spouse name: Not on file   Number  of children: 1   Years of education: Not on file   Highest education level: Not on file  Occupational History   Occupation: Full time    Comment: Engineer, civil (consulting)  Tobacco Use   Smoking status: Former    Packs/day: 0.25    Years: 20.00    Total pack years: 5.00    Types: Cigarettes    Quit date: 09/07/2012    Years since quitting: 9.9   Smokeless tobacco: Current    Types: Snuff  Vaping Use   Vaping Use: Never used  Substance and Sexual Activity   Alcohol use: Yes    Alcohol/week: 0.0 standard drinks of alcohol    Comment: histoorically a heavy drinker ("beer only"), none for a couple months    Drug use: No   Sexual activity: Not Currently  Other Topics Concern   Not on file  Social History Narrative   Not on file   Social  Determinants of Health   Financial Resource Strain: Low Risk  (07/13/2022)   Overall Financial Resource Strain (CARDIA)    Difficulty of Paying Living Expenses: Not hard at all  Food Insecurity: No Food Insecurity (07/13/2022)   Hunger Vital Sign    Worried About Running Out of Food in the Last Year: Never true    Ran Out of Food in the Last Year: Never true  Transportation Needs: No Transportation Needs (07/13/2022)   PRAPARE - Hydrologist (Medical): No    Lack of Transportation (Non-Medical): No  Physical Activity: Inactive (07/13/2022)   Exercise Vital Sign    Days of Exercise per Week: 0 days    Minutes of Exercise per Session: 0 min  Stress: No Stress Concern Present (07/13/2022)   [place]    Feeling of Stress : Not at all  Social Connections: Moderately Isolated (07/13/2022)   Social Connection and Isolation Panel [NHANES]    Frequency of Communication with Friends and Family: Three times a week    Frequency of Social Gatherings with Friends and Family: Three times a week    Attends Religious Services: Never    Active Member of Clubs or Organizations: Yes    Attends Archivist Meetings: Never    Marital Status: Never married   Family History  Problem Relation Age of Onset   Diabetes Mother    Outpatient Encounter Medications as of 08/01/2022  Medication Sig   aspirin 81 MG tablet Take 81 mg by mouth daily.   atorvastatin (LIPITOR) 20 MG tablet Take 1 tablet (20 mg total) by mouth daily.   carvedilol (COREG) 3.125 MG tablet Take 1 tablet (3.125 mg total) by mouth 2 (two) times daily with a meal.   colchicine 0.6 MG tablet 2 tabs immediately and 1 addition tab in 1 hour per day until pain subsides. (Patient not taking: Reported on 08/01/2022)   fluticasone (FLONASE) 50 MCG/ACT nasal spray Place 2 sprays into both nostrils daily. (Patient not taking: Reported on 08/01/2022)    FLUZONE QUADRIVALENT 0.5 ML injection    metolazone (ZAROXOLYN) 2.5 MG tablet Take 2.5 mg by mouth every 14 (fourteen) days. (Patient not taking: Reported on 08/01/2022)   oxyCODONE (OXY IR/ROXICODONE) 5 MG immediate release tablet Take 1 tablet (5 mg total) by mouth 3 (three) times daily as needed for severe pain.   polyethylene glycol (MIRALAX / GLYCOLAX) 17 g packet Take 17 g by mouth daily as needed for mild constipation.  potassium chloride SA (KLOR-CON M) 20 MEQ tablet Take 2 tablets (40 mEq total) by mouth daily. With an additional 40 meq with every metolazone dose   sacubitril-valsartan (ENTRESTO) 49-51 MG Take 1 tablet by mouth 2 (two) times daily.   spironolactone (ALDACTONE) 25 MG tablet Take 1 tablet (25 mg total) by mouth daily.   torsemide (DEMADEX) 20 MG tablet Take 3 tabs in the morning and 2 tabs in PM   No facility-administered encounter medications on file as of 08/01/2022.   ALLERGIES: Allergies  Allergen Reactions   Ibuprofen Hives   Nsaids     ELEVATED LFT'S    VACCINATION STATUS: Immunization History  Administered Date(s) Administered   Hep A / Hep B 03/30/2019, 02/18/2020, 03/21/2020, 08/22/2020   Influenza Split 05/26/2011, 02/29/2012   Influenza,inj,Quad PF,6+ Mos 03/16/2014, 03/18/2015, 04/25/2016, 02/25/2017, 03/05/2018, 03/30/2019, 04/08/2020, 05/30/2021   Influenza-Unspecified 03/16/2014, 03/18/2015, 04/25/2016, 02/25/2017, 03/05/2018, 03/30/2019, 02/23/2022   Moderna Sars-Covid-2 Vaccination 08/16/2019, 09/23/2019, 05/23/2020   Pneumococcal Polysaccharide-23 11/01/2010   Tdap 02/25/2017    HPI [name] is 52 y.o. male who presents today with a medical history as above. he is being seen in consultation for left adrenal mass and type 2 diabetes requested by Susy Frizzle, MD. History is obtained directly from the patient as well as chart review. Gives history of prediabetes for several years, recently his A1c was 6.6% satisfied with diagnosis of  type 2 diabetes.  He is not on medications.  He is currently being processed for cardiac surgery for cardiomyopathy.   He was incidentally found to have left adrenal mass.  His imaging studies summarized below.  He did not have endocrine workup.  He denies any prior history of adrenal, thyroid, parathyroid dysfunctions. 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 He only has 1 kidney.  He had Wilms tumor at age 89 resulting in removal of one of his kidneys.  Review of Systems  Constitutional: + Minimally fluctuating body weight,  no fatigue, no subjective hyperthermia, no subjective hypothermia Eyes: no blurry vision, no xerophthalmia ENT: no sore throat, no nodules palpated in throat, no dysphagia/odynophagia, no hoarseness Cardiovascular: no Chest Pain, no Shortness of Breath, no palpitations, no leg swelling Respiratory: no cough, no shortness of breath Gastrointestinal: no Nausea/Vomiting/Diarhhea Musculoskeletal: no muscle/joint aches Skin: no rashes Neurological: no tremors, no numbness, no tingling, no dizziness Psychiatric: no depression, no anxiety  Objective:       08/01/2022    2:22 PM 06/15/2022    8:03 AM 05/25/2022   10:10 AM  Vitals with BMI  Height 5' 7"$  5' 7"$  5' 7"$   Weight 152 lbs 3 oz 151 lbs 146 lbs  BMI 23.83 0000000 [id number]  Systolic 82 0000000 [id number]  Diastolic 54 60 78  Pulse 88 83 95    BP (!) 82/54   Pulse 88   Ht 5' 7"$  (1.702 m)   Wt 152 lb 3.2 oz (69 kg)   BMI 23.84 kg/m   Wt Readings from Last 3 Encounters:  08/01/22 152 lb 3.2 oz (69 kg)  06/15/22 151 lb (68.5 kg)  05/25/22 146 lb (66.2 kg)    Physical Exam  Constitutional:  Body mass index is 23.84 kg/m.,  not in acute distress, normal state of mind Eyes: PERRLA, EOMI, no exophthalmos ENT: moist mucous membranes, no gross  thyromegaly, no gross cervical lymphadenopathy Cardiovascular: normal precordial activity, Regular Rate  and Rhythm, no Murmur/Rubs/Gallops Respiratory:  adequate breathing efforts, no gross chest deformity, Clear to auscultation bilaterally Gastrointestinal: abdomen soft, Non -tender, No distension, Bowel Sounds present, no gross organomegaly Musculoskeletal: no gross deformities, strength intact in all four extremities Skin: moist, warm, no rashes Neurological: no tremor with outstretched hands, Deep tendon reflexes normal in bilateral lower extremities.  CMP ( most recent) CMP     Component Value Date/Time   NA 134 (L) 06/15/2022 0815   NA 128 (L) 12/05/2018 1035   K 4.5 06/15/2022 0815   CL 95 (L) 06/15/2022 0815   CO2 28 06/15/2022 0815   GLUCOSE 166 (H) 06/15/2022 0815   BUN 47 (H) 06/15/2022 0815   BUN 30 (H) 12/05/2018 1035   CREATININE 1.56 (H) 06/15/2022 0815   CALCIUM 9.4 06/15/2022 0815   PROT 6.8 06/15/2022 0815   ALBUMIN 3.6 04/30/2022 0932   AST 33 06/15/2022 0815   ALT 27 06/15/2022 0815   ALKPHOS 229 (H) 04/30/2022 0932   BILITOT 0.7 06/15/2022 0815   GFRNONAA >60 04/30/2022 0932   GFRNONAA 79 03/05/2018 1528   GFRAA >60 09/04/2019 1022   GFRAA 91 03/05/2018 1528     Diabetic Labs (most recent): Lab Results  Component Value Date   HGBA1C 6.6 (H) 06/15/2022   HGBA1C 6.4 (H) 05/30/2021   HGBA1C 6.7 (H) 11/08/2020   MICROALBUR 2.1 11/08/2020     Lipid Panel ( most recent) Lipid Panel     Component Value Date/Time   CHOL 200 (H) 06/15/2022 0815   TRIG 71 06/15/2022 0815   HDL 54 06/15/2022 0815   CHOLHDL 3.7 06/15/2022 0815   VLDL 17 01/23/2016 1135   LDLCALC 129 (H) 06/15/2022 0815      Lab Results  Component Value Date   TSH 2.221 09/04/2021   TSH 1.403 07/03/2012   TSH 1.153 08/20/2011   TSH 3.647 11/01/2010   FREET4 1.36 11/01/2010       Assessment & Plan:   1. Adrenal mass 1 cm to 4 cm in diameter [facility])  - [name]  is  being seen at a kind request of Susy Frizzle, MD. - I have reviewed his available endocrine records and clinically evaluated the patient. - Based on these reviews, he has adrenal incidentaloma,  however,  there is not sufficient information to proceed with definitive treatment plan. He will need dedicated adrenal CT with and without contrast.  He will also have 24-hour urine collection for measurements of catecholamines, metanephrines and cortisol.  Regarding his type 2 diabetes, with A1c of 6.6%, he will not be considered for medications.  He is advised to avoid ultra processed carbs for now. He is advised to keep close follow-up with his other providers given his multiple medical problems.  - I did not initiate any new prescriptions today. - he is advised to maintain close follow up with Susy Frizzle, MD for primary care needs.   - Time spent with the patient: 45 minutes, of which >50% was spent in  counseling him about his left adrenal adenoma, type 2 diabetes and the rest in obtaining information about his symptoms, reviewing his previous labs/studies ( including abstractions from other facilities),  evaluations, and treatments,  and developing a plan to confirm diagnosis and long term treatment based on the latest standards of care/guidelines; and documenting his care.  [name] participated in the discussions, expressed understanding, and voiced agreement with the above plans.  All questions were answered to his  satisfaction. he is encouraged to contact clinic should he have any questions or concerns prior to his return visit.  Follow up plan: Return in about 2 weeks (around 08/15/2022), or and CT adrenals, for 24 Hr Urine Free Cortisol & [place], MD [facility] [address] [place], [place] [address] Phone: [phone]  Fax: [phone]     08/01/2022, 3:34 PM  This note was partially dictated with  voice recognition software. Similar sounding words can be transcribed inadequately or may not  be corrected upon review.

## 2022-08-01 NOTE — Patient Instructions (Signed)
                                     Advice for Weight Management  -For most of us the best way to lose weight is by diet management. Generally speaking, diet management means consuming less calories intentionally which over time brings about progressive weight loss.  This can be achieved more effectively by avoiding ultra processed carbohydrates, processed meats, unhealthy fats.    It is critically important to know your numbers: how much calorie you are consuming and how much calorie you need. More importantly, our carbohydrates sources should be unprocessed naturally occurring  complex starch food items.  It is always important to balance nutrition also by  appropriate intake of proteins (mainly plant-based), healthy fats/oils, plenty of fruits and vegetables.   -The American College of Lifestyle Medicine (ACL M) recommends nutrition derived mostly from Whole Food, Plant Predominant Sources example an apple instead of applesauce or apple pie. Eat Plenty of vegetables, Mushrooms, fruits, Legumes, Whole Grains, Nuts, seeds in lieu of processed meats, processed snacks/pastries red meat, poultry, eggs.  Use only water or unsweetened tea for hydration.  The College also recommends the need to stay away from risky substances including alcohol, smoking; obtaining 7-9 hours of restorative sleep, at least 150 minutes of moderate intensity exercise weekly, importance of healthy social connections, and being mindful of stress and seek help when it is overwhelming.    -Sticking to a routine mealtime to eat 3 meals a day and avoiding unnecessary snacks is shown to have a big role in weight control. Under normal circumstances, the only time we burn stored energy is when we are hungry, so allow  some hunger to take place- hunger means no food between appropriate meal times, only water.  It is not advisable to starve.   -It is better to avoid simple carbohydrates including:  Cakes, Sweet Desserts, Ice Cream, Soda (diet and regular), Sweet Tea, Candies, Chips, Cookies, Store Bought Juices, Alcohol in Excess of  1-2 drinks a day, Lemonade,  Artificial Sweeteners, Doughnuts, Coffee Creamers, "Sugar-free" Products, etc, etc.  This is not a complete list.....    -Consulting with certified diabetes educators is proven to provide you with the most accurate and current information on diet.  Also, you may be  interested in discussing diet options/exchanges , we can schedule a visit with [name], RDN, CDE for individualized nutrition education.  -Exercise: If you are able: 30 -60 minutes a day ,4 days a week, or 150 minutes of moderate intensity exercise weekly.    The longer the better if tolerated.  Combine stretch, strength, and aerobic activities.  If you were told in the past that you have high risk for cardiovascular diseases, or if you are currently symptomatic, you may seek evaluation by your heart doctor prior to initiating moderate to intense exercise programs.                                  Additional Care Considerations for Diabetes/Prediabetes   -Diabetes  is a chronic disease.  The most important care consideration is regular follow-up with your diabetes care provider with the goal being avoiding or delaying its complications and to take advantage of advances in medications and technology.  If appropriate actions are taken early enough, type 2 diabetes can even be   reversed.  Seek information from the right source.  - Whole Food, Plant Predominant Nutrition is highly recommended: Eat Plenty of vegetables, Mushrooms, fruits, Legumes, Whole Grains, Nuts, seeds in lieu of processed meats, processed snacks/pastries red meat, poultry, eggs as recommended by American College of  Lifestyle Medicine (ACLM).  -Type 2 diabetes is known to coexist with other important comorbidities such as high blood pressure and high cholesterol.  It is critical to control not only the  diabetes but also the high blood pressure and high cholesterol to minimize and delay the risk of complications including coronary artery disease, stroke, amputations, blindness, etc.  The good news is that this diet recommendation for type 2 diabetes is also very helpful for managing high cholesterol and high blood blood pressure.  - Studies showed that people with diabetes will benefit from a class of medications known as ACE inhibitors and statins.  Unless there are specific reasons not to be on these medications, the standard of care is to consider getting one from these groups of medications at an optimal doses.  These medications are generally considered safe and proven to help protect the heart and the kidneys.    - People with diabetes are encouraged to initiate and maintain regular follow-up with eye doctors, foot doctors, dentists , and if necessary heart and kidney doctors.     - It is highly recommended that people with diabetes quit smoking or stay away from smoking, and get yearly  flu vaccine and pneumonia vaccine at least every 5 years.  See above for additional recommendations on exercise, sleep, stress management , and healthy social connections.      

## 2022-08-03 ENCOUNTER — Ambulatory Visit (HOSPITAL_COMMUNITY)
Admission: RE | Admit: 2022-08-03 | Discharge: 2022-08-03 | Disposition: A | Discharge status: Home / Self Care | Payer: Medicare HMO | Source: Ambulatory Visit | Attending: "Endocrinology | Admitting: "Endocrinology

## 2022-08-03 ENCOUNTER — Telehealth: Payer: Self-pay

## 2022-08-03 DIAGNOSIS — I700 Atherosclerosis of aorta: Secondary | ICD-10-CM | POA: Insufficient documentation

## 2022-08-03 DIAGNOSIS — E278 Other specified disorders of adrenal gland: Secondary | ICD-10-CM | POA: Insufficient documentation

## 2022-08-03 DIAGNOSIS — D3502 Benign neoplasm of left adrenal gland: Secondary | ICD-10-CM | POA: Diagnosis not present

## 2022-08-03 DIAGNOSIS — N200 Calculus of kidney: Secondary | ICD-10-CM | POA: Diagnosis not present

## 2022-08-03 DIAGNOSIS — Z905 Acquired absence of kidney: Secondary | ICD-10-CM | POA: Diagnosis not present

## 2022-08-03 DIAGNOSIS — K7460 Unspecified cirrhosis of liver: Secondary | ICD-10-CM | POA: Insufficient documentation

## 2022-08-03 DIAGNOSIS — E279 Disorder of adrenal gland, unspecified: Secondary | ICD-10-CM | POA: Diagnosis not present

## 2022-08-03 DIAGNOSIS — D3500 Benign neoplasm of unspecified adrenal gland: Secondary | ICD-10-CM | POA: Diagnosis not present

## 2022-08-03 LAB — POCT I-STAT CREATININE: Creatinine, Ser: 2.1 mg/dL — ABNORMAL HIGH (ref 0.61–1.24)

## 2022-08-03 MED ORDER — IOHEXOL 300 MG/ML  SOLN
100.0000 mL | Freq: Once | INTRAMUSCULAR | Status: AC | PRN
  Administered 2022-08-03: 80 mL via INTRAVENOUS

## 2022-08-03 NOTE — Telephone Encounter (Signed)
Thank you so much for your help!

## 2022-08-03 NOTE — Telephone Encounter (Signed)
[name] with CT called stating pt is scheduled for a CT adrenal abdomen w/o contrast today at 5:00p.m. She stated when she looked at the comments on the order Dr.Nida commented with and without contrast. He stated when he tried to put the order in it would not let him order the CT with or without so that's why he put in the comments. Richard wanted to make sure if the CT is just authorized for without and if so does it need a different authorization to do with and without. Richard's contact # [phone]. Thank you for your help.

## 2022-08-09 ENCOUNTER — Ambulatory Visit (INDEPENDENT_AMBULATORY_CARE_PROVIDER_SITE_OTHER): Payer: Medicare HMO

## 2022-08-09 DIAGNOSIS — I420 Dilated cardiomyopathy: Secondary | ICD-10-CM | POA: Diagnosis not present

## 2022-08-09 LAB — CUP PACEART REMOTE DEVICE CHECK
Battery Remaining Longevity: 30 mo
Battery Remaining Percentage: 34 %
Brady Statistic RV Percent Paced: 0 %
Date Time Interrogation Session: 20240229083700
HighPow Impedance: 81 Ohm
Implantable Lead Connection Status: 753985
Implantable Lead Implant Date: 20121214
Implantable Lead Location: 753860
Implantable Lead Model: 180
Implantable Lead Serial Number: 307189
Implantable Pulse Generator Implant Date: 20121214
Lead Channel Impedance Value: 555 Ohm
Lead Channel Pacing Threshold Amplitude: 0.7 V
Lead Channel Pacing Threshold Pulse Width: 0.4 ms
Lead Channel Setting Pacing Amplitude: 2.4 V
Lead Channel Setting Pacing Pulse Width: 0.4 ms
Lead Channel Setting Sensing Sensitivity: 0.4 mV
Pulse Gen Serial Number: 118994

## 2022-08-10 ENCOUNTER — Other Ambulatory Visit: Payer: Self-pay | Admitting: Family Medicine

## 2022-08-10 ENCOUNTER — Telehealth: Payer: Self-pay

## 2022-08-10 MED ORDER — OXYCODONE HCL 5 MG PO TABS: 5.0000 mg | ORAL_TABLET | Freq: Three times a day (TID) | ORAL | 0 refills | Status: DC | PRN

## 2022-08-10 NOTE — Telephone Encounter (Signed)
Pt called for refill of his Oxycodone. Last RF was 07/13/22. Last OV was 06/15/2022. Pt uses [facility] in [place]. Thank you.

## 2022-08-13 DIAGNOSIS — E278 Other specified disorders of adrenal gland: Secondary | ICD-10-CM | POA: Diagnosis not present

## 2022-08-14 LAB — CREATININE, URINE, 24 HOUR
Creatinine, 24H Ur: 802 mg/24 hr — ABNORMAL LOW (ref 1000–2000)
Creatinine, Urine: 53.8 mg/dL

## 2022-08-15 ENCOUNTER — Ambulatory Visit: Payer: Medicaid Other | Admitting: "Endocrinology

## 2022-08-17 LAB — CATECHOLAMINES, FRACTIONATED, URINE, 24 HOUR
Dopamine , 24H Ur: 142 ug/24 hr (ref 0–510)
Dopamine, Rand Ur: 95 ug/L
Epinephrine, 24H Ur: <4 ug/24 hr (ref 0–20)
Epinephrine, Rand Ur: <3 ug/L
Norepinephrine, 24H Ur: <22 ug/24 hr (ref 0–135)
Norepinephrine, Rand Ur: <15 ug/L

## 2022-08-18 LAB — METANEPHRINES, URINE, 24 HOUR
Metaneph Total, Ur: 31 ug/L
Metanephrines, 24H Ur: 46 ug/24 hr — ABNORMAL LOW (ref 58–276)
Normetanephrine, 24H Ur: 210 ug/24 hr (ref 156–729)
Normetanephrine, Ur: 141 ug/L

## 2022-08-21 ENCOUNTER — Ambulatory Visit (INDEPENDENT_AMBULATORY_CARE_PROVIDER_SITE_OTHER): Payer: Medicare HMO | Admitting: "Endocrinology

## 2022-08-21 ENCOUNTER — Encounter: Payer: Self-pay | Admitting: "Endocrinology

## 2022-08-21 VITALS — BP 92/56 | HR 80 | Ht 67.0 in | Wt 156.4 lb

## 2022-08-21 DIAGNOSIS — E278 Other specified disorders of adrenal gland: Secondary | ICD-10-CM | POA: Diagnosis not present

## 2022-08-21 NOTE — Progress Notes (Signed)
08/21/2022, 4:49 PM  Endocrinology follow-up note   Subjective:    Patient ID: [name], male    DOB: Nov 25, 1970, PCP Susy Frizzle, MD   Past Medical History:  Diagnosis Date   AICD (automatic cardioverter/defibrillator) present    Arthritis    Cardiomyopathy, nonischemic ([place])    takes Digoxin daily and Carvedilol daily   CHF (congestive heart failure) ([place])    takes Lasix daily as well Aldactone   Chronic back pain    Chronic systolic heart failure ([place])    Cirrhosis ([place])    Depression    takes Prozac daily   GERD (gastroesophageal reflux disease)    takes Omeprazole daily   Hx of Alcohol consumption heavy    rare beer currently   hx of Tobacco abuse    quit smoking 2014   Hyperlipidemia    was on Simvastatin but has been off a year   Insomnia    takes Ambien as needed(just got script yesterday)   Orthostatic hypotension    Scoliosis    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 43 (XRT and chemo)   Past Surgical History:  Procedure Laterality Date   CARPAL TUNNEL RELEASE Right 01/09/2013   Procedure: CARPAL TUNNEL RELEASE;  Surgeon: Winfield Cunas, MD;  Location: [facility] NEURO ORS;  Service: Neurosurgery;  Laterality: Right;  RIGHT carpal tunnel release   CARPAL TUNNEL RELEASE Left 01/30/2013   Procedure: LEFT CARPAL TUNNEL RELEASE;  Surgeon: Winfield Cunas, MD;  Location: [place] NEURO ORS;  Service: Neurosurgery;  Laterality: Left;  LEFT Carpal Tunnel release   CHEST TUBE INSERTION Right 09/09/2013   Procedure: INSERTION PLEURAL DRAINAGE CATHETER;  Surgeon: Ivin Poot, MD;  Location: [facility];  Service: Thoracic;  Laterality: Right;   HYDROCELE EXCISION Right 04/11/2017   Procedure: RIGHT HYDROCELECTOMY ADULT;  Surgeon: Irine Seal, MD;  Location: [facility] ORS;  Service: Urology;  Laterality: Right;   hydrocelectomy  2008   ICD  05/25/2011   [facility] Scientific [employer] SG lead/Energen single chamber device   IMPLANTABLE  CARDIOVERTER DEFIBRILLATOR IMPLANT N/A 05/25/2011   Procedure: IMPLANTABLE CARDIOVERTER DEFIBRILLATOR IMPLANT;  Surgeon: Evans Lance, MD;  Location: [facility] CATH LAB;  Service: Cardiovascular;  Laterality: N/A;   NEPHRECTOMY  36 yrs ago   Wilms Tumor   RIGHT HEART CATH N/A 01/29/2019   Procedure: RIGHT HEART CATH;  Surgeon: Jolaine Artist, MD;  Location: [facility] CV LAB;  Service: Cardiovascular;  Laterality: N/A;   RIGHT HEART CATH N/A 09/08/2019   Procedure: RIGHT HEART CATH;  Surgeon: Jolaine Artist, MD;  Location: [facility] CV LAB;  Service: Cardiovascular;  Laterality: N/A;   RIGHT/LEFT HEART CATH AND CORONARY ANGIOGRAPHY N/A 02/12/2017   Procedure: RIGHT/LEFT HEART CATH AND CORONARY ANGIOGRAPHY;  Surgeon: Jolaine Artist, MD;  Location: [facility] CV LAB;  Service: Cardiovascular;  Laterality: N/A;   ULNAR NERVE TRANSPOSITION Right 01/09/2013   Procedure: ULNAR NERVE DECOMPRESSION/TRANSPOSITION;  Surgeon: Winfield Cunas, MD;  Location: [facility] NEURO ORS;  Service: Neurosurgery;  Laterality: Right;  RIGHT ulnar nerve decompression   Social History   Socioeconomic History   Marital status: Single    Spouse name: Not on file  Number of children: 1   Years of education: Not on file   Highest education level: Not on file  Occupational History   Occupation: Full time    Comment: Engineer, civil (consulting)  Tobacco Use   Smoking status: Former    Packs/day: 0.25    Years: 20.00    Total pack years: 5.00    Types: Cigarettes    Quit date: 09/07/2012    Years since quitting: 9.9   Smokeless tobacco: Current    Types: Snuff  Vaping Use   Vaping Use: Never used  Substance and Sexual Activity   Alcohol use: Yes    Alcohol/week: 0.0 standard drinks of alcohol    Comment: histoorically a heavy drinker ("beer only"), none for a couple months    Drug use: No   Sexual activity: Not Currently  Other Topics Concern   Not on file  Social History Narrative   Not on file   Social  Determinants of Health   Financial Resource Strain: Low Risk  (07/13/2022)   Overall Financial Resource Strain (CARDIA)    Difficulty of Paying Living Expenses: Not hard at all  Food Insecurity: No Food Insecurity (07/13/2022)   Hunger Vital Sign    Worried About Running Out of Food in the Last Year: Never true    Ran Out of Food in the Last Year: Never true  Transportation Needs: No Transportation Needs (07/13/2022)   PRAPARE - Hydrologist (Medical): No    Lack of Transportation (Non-Medical): No  Physical Activity: Inactive (07/13/2022)   Exercise Vital Sign    Days of Exercise per Week: 0 days    Minutes of Exercise per Session: 0 min  Stress: No Stress Concern Present (07/13/2022)   [place]    Feeling of Stress : Not at all  Social Connections: Moderately Isolated (07/13/2022)   Social Connection and Isolation Panel [NHANES]    Frequency of Communication with Friends and Family: Three times a week    Frequency of Social Gatherings with Friends and Family: Three times a week    Attends Religious Services: Never    Active Member of Clubs or Organizations: Yes    Attends Archivist Meetings: Never    Marital Status: Never married   Family History  Problem Relation Age of Onset   Diabetes Mother    Outpatient Encounter Medications as of 08/21/2022  Medication Sig   aspirin 81 MG tablet Take 81 mg by mouth daily.   atorvastatin (LIPITOR) 20 MG tablet Take 1 tablet (20 mg total) by mouth daily.   carvedilol (COREG) 3.125 MG tablet Take 1 tablet (3.125 mg total) by mouth 2 (two) times daily with a meal.   colchicine 0.6 MG tablet 2 tabs immediately and 1 addition tab in 1 hour per day until pain subsides. (Patient not taking: Reported on 08/01/2022)   fluticasone (FLONASE) 50 MCG/ACT nasal spray Place 2 sprays into both nostrils daily. (Patient not taking: Reported on 08/01/2022)    FLUZONE QUADRIVALENT 0.5 ML injection    metolazone (ZAROXOLYN) 2.5 MG tablet Take 2.5 mg by mouth every 14 (fourteen) days. (Patient not taking: Reported on 08/01/2022)   oxyCODONE (OXY IR/ROXICODONE) 5 MG immediate release tablet Take 1 tablet (5 mg total) by mouth 3 (three) times daily as needed for severe pain.   polyethylene glycol (MIRALAX / GLYCOLAX) 17 g packet Take 17 g by mouth daily as needed for mild constipation.  potassium chloride SA (KLOR-CON M) 20 MEQ tablet Take 2 tablets (40 mEq total) by mouth daily. With an additional 40 meq with every metolazone dose   sacubitril-valsartan (ENTRESTO) 49-51 MG Take 1 tablet by mouth 2 (two) times daily.   spironolactone (ALDACTONE) 25 MG tablet Take 1 tablet (25 mg total) by mouth daily.   torsemide (DEMADEX) 20 MG tablet Take 3 tabs in the morning and 2 tabs in PM   No facility-administered encounter medications on file as of 08/21/2022.   ALLERGIES: Allergies  Allergen Reactions   Ibuprofen Hives   Nsaids     ELEVATED LFT'S    VACCINATION STATUS: Immunization History  Administered Date(s) Administered   Hep A / Hep B 03/30/2019, 02/18/2020, 03/21/2020, 08/22/2020   Influenza Split 05/26/2011, 02/29/2012   Influenza,inj,Quad PF,6+ Mos 03/16/2014, 03/18/2015, 04/25/2016, 02/25/2017, 03/05/2018, 03/30/2019, 04/08/2020, 05/30/2021   Influenza-Unspecified 03/16/2014, 03/18/2015, 04/25/2016, 02/25/2017, 03/05/2018, 03/30/2019, 02/23/2022   Moderna Sars-Covid-2 Vaccination 08/16/2019, 09/23/2019, 05/23/2020   Pneumococcal Polysaccharide-23 11/01/2010   Tdap 02/25/2017    HPI [name] is 52 y.o. male who presents today with a medical history as above. he is being seen in follow-up after he was consultation for left adrenal mass and type 2 diabetes requested by Susy Frizzle, MD.  -He was sent for 24-hour urine studies as well as repeat adrenal protocol CT scan. His urine studies to rule out pheochromocytoma and adrenal CT  confirms Stable 1.4 cm benign left adrenal adenoma.  Gives history of prediabetes for several years, recently his A1c was 6.6% satisfied with diagnosis of type 2 diabetes.  He is not on medications.  He is currently being processed for cardiac surgery for cardiomyopathy.     He has no new complaints today. He denies any prior history of adrenal, thyroid, parathyroid dysfunctions. 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 He only has 1 kidney.  He had Wilms tumor at age 39 resulting in removal of one of his kidneys.  Review of Systems  Constitutional: + Minimally fluctuating body weight,  no fatigue, no subjective hyperthermia, no subjective hypothermia Eyes: no blurry vision, no xerophthalmia ENT: no sore throat, no nodules palpated in throat, no dysphagia/odynophagia, no hoarseness   Objective:       08/21/2022    2:53 PM 08/01/2022    2:22 PM 06/15/2022    8:03 AM  Vitals with BMI  Height '5\' 7"'$  '5\' 7"'$  '5\' 7"'$   Weight 156 lbs 6 oz 152 lbs 3 oz 151 lbs  BMI 24.49 [id number]  Systolic 92 82 0000000  Diastolic 56 54 60  Pulse 80 88 83    BP (!) 92/56   Pulse 80   Ht '5\' 7"'$  (1.702 m)   Wt 156 lb 6.4 oz (70.9 kg)   BMI 24.50 kg/m   Wt Readings from Last 3 Encounters:  08/21/22 156 lb 6.4 oz (70.9 kg)  08/01/22 152 lb 3.2 oz (69 kg)  06/15/22 151 lb (68.5 kg)    Physical Exam  Constitutional:  Body mass index is 24.5 kg/m.,  not in acute distress, normal state of mind Eyes: PERRLA, EOMI, no exophthalmos ENT: moist mucous membranes, no gross thyromegaly, no gross cervical lymphadenopathy   CMP ( most recent) CMP     Component Value Date/Time   NA 134 (L) 06/15/2022 0815   NA 128 (L) 12/05/2018 1035   K 4.5 06/15/2022  0815   CL 95 (L) 06/15/2022 0815   CO2 28 06/15/2022 0815   GLUCOSE 166 (H)  06/15/2022 0815   BUN 47 (H) 06/15/2022 0815   BUN 30 (H) 12/05/2018 1035   CREATININE 2.10 (H) 08/03/2022 1721   CREATININE 1.56 (H) 06/15/2022 0815   CALCIUM 9.4 06/15/2022 0815   PROT 6.8 06/15/2022 0815   ALBUMIN 3.6 04/30/2022 0932   AST 33 06/15/2022 0815   ALT 27 06/15/2022 0815   ALKPHOS 229 (H) 04/30/2022 0932   BILITOT 0.7 06/15/2022 0815   GFRNONAA >60 04/30/2022 0932   GFRNONAA 79 03/05/2018 1528   GFRAA >60 09/04/2019 1022   GFRAA 91 03/05/2018 1528     Diabetic Labs (most recent): Lab Results  Component Value Date   HGBA1C 6.6 (H) 06/15/2022   HGBA1C 6.4 (H) 05/30/2021   HGBA1C 6.7 (H) 11/08/2020   MICROALBUR 2.1 11/08/2020     Lipid Panel ( most recent) Lipid Panel     Component Value Date/Time   CHOL 200 (H) 06/15/2022 0815   TRIG 71 06/15/2022 0815   HDL 54 06/15/2022 0815   CHOLHDL 3.7 06/15/2022 0815   VLDL 17 01/23/2016 1135   LDLCALC 129 (H) 06/15/2022 0815      Lab Results  Component Value Date   TSH 2.221 09/04/2021   TSH 1.403 07/03/2012   TSH 1.153 08/20/2011   TSH 3.647 11/01/2010   FREET4 1.36 11/01/2010     CT scan of abdomen August 03, 2022 IMPRESSION: Stable 1.4 cm benign left adrenal adenoma (no followup imaging is recommended).   Prior left nephrectomy. No evidence of recurrent or metastatic disease.   Hepatic cirrhosis. No evidence of hepatic neoplasm or other acute findings.   Tiny nonobstructing right renal calculus.   Aortic Atherosclerosis (ICD10-I70.0).  Assessment & Plan:   1. Adrenal mass 1 cm to 4 cm in diameter [facility])   - I have reviewed his  new and available endocrine records and clinically evaluated the patient. - Based on these reviews, he has left adrenal adenoma measuring 1.4 cm with no suspicious features for malignancy and 24-hour urine studies do not indicate pheochromocytoma.   His cortisol was not determined.  However it is clinically unlikely for him to have Cushing's syndrome. He will not  need immediate surgical intervention for this adenoma.  He will be seen in 6 months with a.m. cortisol, and plasma metanephrines. Regarding his type 2 diabetes, with A1c of 6.6%, he will not be considered for medications.  He is advised to avoid ultra processed carbs for now. This workup should clear him for his planned cardiac surgery from endocrine point of view. He is advised to keep close follow-up with his other providers given his multiple medical problems.  - I did not initiate any new prescriptions today. - he is advised to maintain close follow up with Susy Frizzle, MD for primary care needs.   I spent  21  minutes in the care of the patient today including review of labs from Thyroid Function, CMP, and other relevant labs ; imaging/biopsy records (current and previous including abstractions from other facilities); face-to-face time discussing  his lab results and symptoms, medications doses, his options of short and long term treatment based on the latest standards of care / guidelines;   and documenting the encounter.  [name]  participated in the discussions, expressed understanding, and voiced agreement with the above plans.  All questions were answered to his satisfaction. he is encouraged  to contact clinic should he have any questions or concerns prior to his return visit.   Follow up plan: Return in about 6 months (around 02/21/2023) for Fasting Labs  in AM B4 8.   Glade Lloyd, MD [facility] [address] [place], [place] [address] Phone: [phone]  Fax: [phone]     08/21/2022, 4:49 PM  This note was partially dictated with voice recognition software. Similar sounding words can be transcribed inadequately or may not  be corrected upon review.

## 2022-08-22 DIAGNOSIS — I428 Other cardiomyopathies: Secondary | ICD-10-CM | POA: Diagnosis not present

## 2022-08-22 DIAGNOSIS — Z01818 Encounter for other preprocedural examination: Secondary | ICD-10-CM | POA: Diagnosis not present

## 2022-08-22 DIAGNOSIS — I361 Nonrheumatic tricuspid (valve) insufficiency: Secondary | ICD-10-CM | POA: Diagnosis not present

## 2022-08-22 DIAGNOSIS — I5022 Chronic systolic (congestive) heart failure: Secondary | ICD-10-CM | POA: Diagnosis not present

## 2022-08-31 NOTE — Progress Notes (Signed)
Advanced Heart Failure Clinic Note    PCP: Susy Frizzle, MD Primary Cardiologist: Dr Domenic Polite HF Cardiologist: Dr Haroldine Laws [name]: Dr Mosetta Pigeon  HPI: [name] is a 52 y.o. male with history of NICM (diagnosed 10/2010 - norm cors 2012 and [id number]), chronic systolic heart failure EF 15% (08/2013), S/P ICD [employer], smoker, Wilms tumor s/p nephrectomy at age 89 had chemoradiation, scoliosis.    We saw him at the end of March 2015 and had large recurrent R pleural effusion. Referred to Dr. Prescott Gum who placed Pleurex catheter on 4/10, removed on 09/28/13.   CPX (5/15) with RER 1.06, VO2 max 18.5, VE/VCO2 slope 32.1.  Moderate to severe functional limitation, circulatory.  CPX 09/17/16 Peak VO2: 20.5 (53% predicted peak VO2) Slope 35 Underwent L/RHC on 02/12/17. Had normal coronaries and relatively well-compensated hemodynamics. EF 20%. Echo 6/20 EF 15% RV mild HK with severe TR Personally reviewed CPX 8/20 with pVO2 17.1 (down from 20.5)  Slope 35 (stable).   CT abdomen 10/20 c/w cirrhosis. Paracentesis 03/02/19: 3.7 L off. No need for repeat paracenteses  Has been evaluated at [facility] by Dr Mosetta Pigeon for possible transplant. Seen last in 4/21 - felt too early for transplant but getting closer.  Echo 12/17/20: LVEF < 20% RV normal severe TR  Admitted 3/26-28/23 due to volume overload, fever and cough. Purulent sinus drainage. Underwent IV diuresis.  But also received abx. Echo 09/04/21 unchanged. LVEF < 20% G3DD. Severe TR. Entresto and spiro stopped due to low BP  He was seen in the HF clinic 4/23. CPX repeat and referred to [facility]   He was seen in the HF clinic 02/22/22. Volume overloaded despite higher dose of torsemide. Instructed to take Furoscix  bid x 3 days.  F/U in HF clinic 03/23/22 and remained fluid overloaded. Got Furoscix daily x2 days.  On 03/27/22 had VT with successful ATP. EP reviewed and therapies were appropriate. He was unaware of the event. Called by EP and  given [place] DMV no driving for 6 months.   Follow up 11/23 remained NYHA III-IIIb. Referred back to [facility] for transplant work up.   Echo 05/22/22: EF [id number], mild RV systolic dysfunction mild to mod MR, mod TR, RVSP 51 mmHg  RHC 05/24/22: RA 7 PA 47/24 (32) W 24 CI 2.5 PVR 1.8 hepatic wedge 10    Today he returns for HF follow up. Overall feeling great. Recently found to have benign left adrenal adenoma. Urine studies ruled out pheochromocytoma. He is not SOB walking up steps or lifting groceries; this is the best he has felt in awhile. Denies palpitations, CP, dizziness, edema, or PND/Orthopnea. Appetite ok. No fever or chills. He does not weigh himself at home. Taking all medications. Has follow up with [facility] 10/10/22, will have colonoscopy 10/17/22. Took metolazone twice in the last 3 months. Main complaint is insomnia, he snores and has daytime fatigue.  Cardiac Studies  - RHC (12/23): RA 7 PA 47/24 (32) W 24 CI 2.5 PVR 1.8 hepatic wedge 10    - Echo (12/23): EF [id number], mild RV systolic dysfunction mild to mod MR, mod TR, RVSP 51 mmHg  - CPX (7/23) Pre-Exercise PFTs  FVC 2.79 (61%)      FEV1 2.00 (55%)        FEV1/FVC 72 (89%)        MVV 84 (58%)  Resting HR: 87 Standing HR: 88 Peak HR: 141   (82% age predicted max HR)  BP rest: 90/68 Standing  BP: 86/68 BP peak: 96/62  Peak VO2: 14.5 (40% predicted peak VO2)  VeVCo slope 35  - CPX 12/17/20 FVC 2.41 (49%)      FEV1 1.51 (38%)        FEV1/FVC 63 (78%)        MVV 62 (40%)  BP rest: 106/60 Standing BP: 100/62 BP peak: 104/62  Peak VO2: 18.3 (50% predicted peak VO2)  VE/VCO2 slope:  38  Peak RER: 1.10  Ventilatory Threshold: 15.6 (42% predicted or measured peak VO2)  VE/MVV:  84%   - RHC 8/20  RA = 17 RV = 46/18 PA = 51/22 (33) PCW = 28 Fick cardiac output/index =4.0/2.2 PVR =1.0 WU Ao sat = 99% PA sat = 62%, 62% RA/PCWP = 0.61 PaPI = 1.71  SH: Lives alone. Unemployed. On disability. Does not drink alcohol.  FH: Grandfather  MI  ROS: All systems negative except as listed in HPI, PMH and Problem List.  Past Medical History:  Diagnosis Date   AICD (automatic cardioverter/defibrillator) present    Arthritis    Cardiomyopathy, nonischemic ([place])    takes Digoxin daily and Carvedilol daily   CHF (congestive heart failure) ([place])    takes Lasix daily as well Aldactone   Chronic back pain    Chronic systolic heart failure ([place])    Cirrhosis ([place])    Depression    takes Prozac daily   GERD (gastroesophageal reflux disease)    takes Omeprazole daily   Hx of Alcohol consumption heavy    rare beer currently   hx of Tobacco abuse    quit smoking 2014   Hyperlipidemia    was on Simvastatin but has been off a year   Insomnia    takes Ambien as needed(just got script yesterday)   Orthostatic hypotension    Scoliosis    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 78 (XRT and chemo)   Current Outpatient Medications  Medication Sig Dispense Refill   aspirin 81 MG tablet Take 81 mg by mouth daily.     atorvastatin (LIPITOR) 20 MG tablet Take 1 tablet (20 mg total) by mouth daily. 90 tablet 3   carvedilol (COREG) 3.125 MG tablet Take 1 tablet (3.125 mg total) by mouth 2 (two) times daily with a meal. 180 tablet 0   colchicine 0.6 MG tablet Take 0.6 mg by mouth as needed.     fluticasone (FLONASE) 50 MCG/ACT nasal spray Place 2 sprays into both nostrils daily. (Patient taking differently: Place 2 sprays into both nostrils daily as needed.) 16 g 6   metolazone (ZAROXOLYN) 2.5 MG tablet Take 2.5 mg by mouth as needed.     oxyCODONE (OXY IR/ROXICODONE) 5 MG immediate release tablet Take 1 tablet (5 mg total) by mouth 3 (three) times daily as needed for severe pain. 60 tablet 0   polyethylene glycol (MIRALAX / GLYCOLAX) 17 g packet Take 17 g by mouth daily as needed for mild constipation. 14 each 0   potassium chloride SA (KLOR-CON M) 20 MEQ tablet Take 2 tablets (40 mEq total) by mouth daily. With an additional 40 meq with every  metolazone dose 70 tablet 3   sacubitril-valsartan (ENTRESTO) 49-51 MG Take 1 tablet by mouth 2 (two) times daily. 180 tablet 3   spironolactone (ALDACTONE) 25 MG tablet Take 1 tablet (25 mg total) by mouth daily. 90 tablet 3   torsemide (DEMADEX) 20 MG tablet Take 3 tabs in the morning and 2 tabs in PM 150 tablet 6  FLUZONE QUADRIVALENT 0.5 ML injection  (Patient not taking: Reported on 09/07/2022)     No current facility-administered medications for this encounter.   BP 104/62   Pulse 86   Wt 67.6 kg (149 lb)   SpO2 98%   BMI 23.34 kg/m   Wt Readings from Last 3 Encounters:  09/07/22 67.6 kg (149 lb)  08/21/22 70.9 kg (156 lb 6.4 oz)  08/01/22 69 kg (152 lb 3.2 oz)   PHYSICAL EXAM: General:  NAD. No resp difficulty, walked into clinic HEENT: Normal Neck: Supple. No JVD. Carotids 2+ bilat; no bruits. No lymphadenopathy or thryomegaly appreciated. Cor: PMI nondisplaced. Regular rate & rhythm. No rubs, gallops, 2/6 TR/MR Lungs: Clear Abdomen: Soft, nontender, nondistended. No hepatosplenomegaly. No bruits or masses. Good bowel sounds. Extremities: No cyanosis, clubbing, rash, edema Neuro: Alert & oriented x 3, cranial nerves grossly intact. Moves all 4 extremities w/o difficulty. Affect pleasant.  Device interrogation (personally reviewed): 2.2 hr/day activity, average HR 81 bpm  ASSESSMENT & PLAN: 1. Chronic Systolic HF:   - Nonischemic cardiomyopathy - Echo 10/15 EF 10%.  - Echo 2/18 EF 15% RV mild HK  [employer] ICD.   - Echo 6/20 EF 15% RV normal severe TR - Cath 9/18: normal cors. Relatively well-compensated hemodynmaics - W/u has revealed evidence of cirrhosis which I suspect is due to a combination of ETOH and RV failure/severe TR. Synthetic function currently seems ok (Albumin 4.2  INR 1.3 on 9/20) - Doubt TV repair will be sufficient for him so question will be if he will still be a transplant candidate. GI has seen and feels liver disease likely not severe  enough to be a barrier to transplant.  May need [facility] team to weigh in as well. - He saw Dr. Mosetta Pigeon in 4/21 who felt we would need to proceed with advanced therapies sooner than later in his disease given cirrhosis but still felt he was a bit early. Recommended close f/u with CPX. - Admitted (3/23) with ADHF in setting of URI - Echo (3/23): EF < 20% G3DD. Severe TR  - CPX 7/23 pVO2 14.5 (40%) slope 35% Flat BP response  - Echo (12/[facility] at [facility] EF [id number], mild RV systolic dysfunction mild to mod MR, mod TR, RVSP 51 mmHg - RHC (12/23 at [facility]): RA 7 PA 47/24 (32) W 24 CI 2.5 PVR 1.8 hepatic wedge 10   - Improved NYHA II, volume OK today. - Continue torsemide 60 mg qam/40 mg qpm + 40 KCL daily. - Continue Entresto 49/51 mg bid (low BP with increase dose)  - Continue carvedilol 3.125 mg bid (failed titration of 6.25/3.125) for now. May need to stop if BP drops further.  - Continue spironolactone 25 mg daily.  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 - Unable to tolerate digoxin due to heartburn. - Blood type A +. - He is followed at [facility] for transplant work up.  - Labs today.  2. Severe TR - Echo with moderate to severe TR but TV seems structurally intact. ? If related to ICD wire or just dilated annulus.  - Doubt TV repair will be sufficient for him, so question will be if he will still be a transplant candidate. 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   - w/u as above - Appreciate GI input  4. VT - 03/27/22 had successful ATP. Appropriate therapy for VT.   - No further VT. - Driving restrictions x 6 months (~  09/26/22).  5. Insomnia - Discussed sleep hygiene - OK to use OTC melatonin, avoid diphenhydramine - Consider suvorexant or trazadone. Defer to PCP.  6. Snoring - Has daytime fatigue too - Arrange sleep study.  Follow up in 3 months with Dr. Haroldine Laws.  Rafael Bihari, FNP  9:08 AM

## 2022-09-07 ENCOUNTER — Ambulatory Visit (HOSPITAL_COMMUNITY)
Admission: RE | Admit: 2022-09-07 | Discharge: 2022-09-07 | Disposition: A | Discharge status: Home / Self Care | Payer: Medicare HMO | Source: Ambulatory Visit | Attending: Family Medicine | Admitting: Family Medicine

## 2022-09-07 ENCOUNTER — Encounter (HOSPITAL_COMMUNITY): Payer: Self-pay

## 2022-09-07 VITALS — BP 104/62 | HR 86 | Wt 149.0 lb

## 2022-09-07 DIAGNOSIS — Z6710 Type A blood, Rh positive: Secondary | ICD-10-CM | POA: Diagnosis not present

## 2022-09-07 DIAGNOSIS — Z95810 Presence of automatic (implantable) cardiac defibrillator: Secondary | ICD-10-CM | POA: Diagnosis not present

## 2022-09-07 DIAGNOSIS — I071 Rheumatic tricuspid insufficiency: Secondary | ICD-10-CM | POA: Diagnosis not present

## 2022-09-07 DIAGNOSIS — I5022 Chronic systolic (congestive) heart failure: Secondary | ICD-10-CM | POA: Insufficient documentation

## 2022-09-07 DIAGNOSIS — Z79899 Other long term (current) drug therapy: Secondary | ICD-10-CM | POA: Diagnosis not present

## 2022-09-07 DIAGNOSIS — K7469 Other cirrhosis of liver: Secondary | ICD-10-CM

## 2022-09-07 DIAGNOSIS — G4700 Insomnia, unspecified: Secondary | ICD-10-CM | POA: Insufficient documentation

## 2022-09-07 DIAGNOSIS — Z87891 Personal history of nicotine dependence: Secondary | ICD-10-CM | POA: Insufficient documentation

## 2022-09-07 DIAGNOSIS — J90 Pleural effusion, not elsewhere classified: Secondary | ICD-10-CM | POA: Diagnosis not present

## 2022-09-07 DIAGNOSIS — M419 Scoliosis, unspecified: Secondary | ICD-10-CM | POA: Diagnosis not present

## 2022-09-07 DIAGNOSIS — I428 Other cardiomyopathies: Secondary | ICD-10-CM | POA: Diagnosis not present

## 2022-09-07 DIAGNOSIS — I472 Ventricular tachycardia, unspecified: Secondary | ICD-10-CM

## 2022-09-07 DIAGNOSIS — R0683 Snoring: Secondary | ICD-10-CM | POA: Insufficient documentation

## 2022-09-07 DIAGNOSIS — K7460 Unspecified cirrhosis of liver: Secondary | ICD-10-CM | POA: Insufficient documentation

## 2022-09-07 LAB — BASIC METABOLIC PANEL
Anion gap: 14 (ref 5–15)
BUN: 40 mg/dL — ABNORMAL HIGH (ref 6–20)
CO2: 28 mmol/L (ref 22–32)
Calcium: 9.7 mg/dL (ref 8.9–10.3)
Chloride: 91 mmol/L — ABNORMAL LOW (ref 98–111)
Creatinine, Ser: 1.86 mg/dL — ABNORMAL HIGH (ref 0.61–1.24)
GFR, Estimated: 44 mL/min — ABNORMAL LOW (ref >60)
Glucose, Bld: 138 mg/dL — ABNORMAL HIGH (ref 70–99)
Potassium: 4.1 mmol/L (ref 3.5–5.1)
Sodium: 133 mmol/L — ABNORMAL LOW (ref 135–145)

## 2022-09-07 LAB — DIGOXIN LEVEL: Digoxin Level: <0.2 ng/mL — ABNORMAL LOW (ref 0.8–2.0)

## 2022-09-07 NOTE — Patient Instructions (Signed)
It was great to see you today! No medication changes are needed at this time.  Your provider has recommended that you have a home sleep study (Itamar Test).  We have provided you with the equipment in our office today. Please go ahead and download the app. DO NOT OPEN OR TAMPER WITH THE BOX UNTIL WE ADVISE YOU TO DO SO. Once insurance has approved the test our office will call you with PIN number and approval to proceed with testing. Once you have completed the test you just dispose of the equipment, the information is automatically uploaded to [place] via blue-tooth technology. If your test is positive for sleep apnea and you need a home CPAP machine you will be contacted by Dr Theodosia Blender office [facility]) to set this up.  Your physician wants you to follow-up in: 3 months with Dr Haroldine Laws. You will receive a reminder letter in the mail two months in advance. If you don't receive a letter, please call our office to schedule the follow-up appointment.   Do the following things EVERYDAY: Weigh yourself in the morning before breakfast. Write it down and keep it in a log. Take your medicines as prescribed Eat low salt foods--Limit salt (sodium) to 2000 mg per day.  Stay as active as you can everyday Limit all fluids for the day to less than 2 liters  At the [facility], you and your health needs are our priority. As part of our continuing mission to provide you with exceptional heart care, we have created designated Provider Care Teams. These Care Teams include your primary Cardiologist (physician) and Advanced Practice Providers (APPs- Physician Assistants and Nurse Practitioners) who all work together to provide you with the care you need, when you need it.   You may see any of the following providers on your designated Care Team at your next follow up: Dr Glori Bickers Dr Loralie Champagne Dr. Roxana Hires, NP Lyda Jester, [place] [facility] [place], [place] Forestine Na, NP Audry Riles, PharmD   Please be sure to bring in all your medications bottles to every appointment.    Thank you for choosing [facility]

## 2022-09-07 NOTE — Progress Notes (Signed)
Remote ICD transmission.   

## 2022-09-12 ENCOUNTER — Telehealth: Payer: Self-pay

## 2022-09-12 NOTE — Telephone Encounter (Signed)
Prescription Request  09/12/2022  LOV: 06/15/22  What is the name of the medication or equipment? oxyCODONE (OXY IR/ROXICODONE) 5 MG immediate release tablet [id number]  Have you contacted your pharmacy to request a refill? No   Which pharmacy would you like this sent to?  [place], [place] [address] Phone: [phone] Fax: [phone]    Patient notified that their request is being sent to the clinical staff for review and that they should receive a response within 2 business days.   Please advise at [facility] [phone]

## 2022-09-13 ENCOUNTER — Telehealth: Payer: Self-pay | Admitting: Gastroenterology

## 2022-09-13 ENCOUNTER — Other Ambulatory Visit: Payer: Self-pay | Admitting: Family Medicine

## 2022-09-13 MED ORDER — OXYCODONE HCL 5 MG PO TABS: 5.0000 mg | ORAL_TABLET | Freq: Three times a day (TID) | ORAL | 0 refills | Status: DC | PRN

## 2022-09-13 NOTE — Telephone Encounter (Signed)
Lilia Pro from [facility] cardiology is requesting to speak with a nurse in regard up coming appt for patient .Marland KitchenPlease call her at [phone].Thanks

## 2022-09-13 NOTE — Telephone Encounter (Signed)
Lm on vm for Lilia Pro with [facility] to return call.

## 2022-09-17 NOTE — Telephone Encounter (Signed)
Called and spoke with Lequita Halt with [facility]. Lequita Halt informed me that pt is a transplant candidate and she wanted to know if we were following up on his cirrhosis or just doing a colonoscopy. I informed her that patient is scheduled for an office visit due to positive cologuard, it does look like he is overdue for cirrhosis evaluation as well. I added this information to patient's appt note. Lequita Halt is aware that we will likely obtain updated labs and I'm,aging of the liver. Lequita Halt verbalized understanding and had no concerns at the end of the call.

## 2022-10-10 DIAGNOSIS — J449 Chronic obstructive pulmonary disease, unspecified: Secondary | ICD-10-CM | POA: Diagnosis not present

## 2022-10-10 DIAGNOSIS — I081 Rheumatic disorders of both mitral and tricuspid valves: Secondary | ICD-10-CM | POA: Diagnosis not present

## 2022-10-10 DIAGNOSIS — Z01818 Encounter for other preprocedural examination: Secondary | ICD-10-CM | POA: Diagnosis not present

## 2022-10-10 DIAGNOSIS — I5022 Chronic systolic (congestive) heart failure: Secondary | ICD-10-CM | POA: Diagnosis not present

## 2022-10-10 DIAGNOSIS — Z85528 Personal history of other malignant neoplasm of kidney: Secondary | ICD-10-CM | POA: Diagnosis not present

## 2022-10-10 DIAGNOSIS — Z905 Acquired absence of kidney: Secondary | ICD-10-CM | POA: Diagnosis not present

## 2022-10-10 DIAGNOSIS — Z87891 Personal history of nicotine dependence: Secondary | ICD-10-CM | POA: Diagnosis not present

## 2022-10-10 DIAGNOSIS — I472 Ventricular tachycardia, unspecified: Secondary | ICD-10-CM | POA: Diagnosis not present

## 2022-10-10 DIAGNOSIS — K219 Gastro-esophageal reflux disease without esophagitis: Secondary | ICD-10-CM | POA: Diagnosis not present

## 2022-10-10 DIAGNOSIS — M419 Scoliosis, unspecified: Secondary | ICD-10-CM | POA: Diagnosis not present

## 2022-10-10 DIAGNOSIS — I361 Nonrheumatic tricuspid (valve) insufficiency: Secondary | ICD-10-CM | POA: Diagnosis not present

## 2022-10-10 DIAGNOSIS — I428 Other cardiomyopathies: Secondary | ICD-10-CM | POA: Diagnosis not present

## 2022-10-11 ENCOUNTER — Telehealth: Payer: Self-pay

## 2022-10-11 NOTE — Telephone Encounter (Signed)
Pt called requesting a refill on his Oxycodone. Last RF was 09/13/22. Last OV 07/05/2022.  Pt uses [facility] in [place]. Thank you

## 2022-10-12 ENCOUNTER — Other Ambulatory Visit: Payer: Self-pay | Admitting: Family Medicine

## 2022-10-12 MED ORDER — OXYCODONE HCL 5 MG PO TABS: 5.0000 mg | ORAL_TABLET | Freq: Three times a day (TID) | ORAL | 0 refills | Status: DC | PRN

## 2022-10-17 ENCOUNTER — Other Ambulatory Visit (INDEPENDENT_AMBULATORY_CARE_PROVIDER_SITE_OTHER): Payer: Medicare HMO

## 2022-10-17 ENCOUNTER — Ambulatory Visit (INDEPENDENT_AMBULATORY_CARE_PROVIDER_SITE_OTHER): Payer: Medicare HMO | Admitting: Gastroenterology

## 2022-10-17 ENCOUNTER — Encounter: Payer: Self-pay | Admitting: Gastroenterology

## 2022-10-17 VITALS — BP 106/72 | HR 85 | Ht 67.0 in | Wt 154.0 lb

## 2022-10-17 DIAGNOSIS — K7460 Unspecified cirrhosis of liver: Secondary | ICD-10-CM

## 2022-10-17 DIAGNOSIS — I509 Heart failure, unspecified: Secondary | ICD-10-CM | POA: Diagnosis not present

## 2022-10-17 DIAGNOSIS — R195 Other fecal abnormalities: Secondary | ICD-10-CM

## 2022-10-17 LAB — PROTIME-INR
INR: 1.1 ratio — ABNORMAL HIGH (ref 0.8–1.0)
Prothrombin Time: 11.8 s (ref 9.6–13.1)

## 2022-10-17 LAB — CBC WITH DIFFERENTIAL/PLATELET
Basophils Absolute: 0.1 10*3/uL (ref 0.0–0.1)
Basophils Relative: 1.5 % (ref 0.0–3.0)
Eosinophils Absolute: 0.2 10*3/uL (ref 0.0–0.7)
Eosinophils Relative: 5.1 % — ABNORMAL HIGH (ref 0.0–5.0)
HCT: 39.2 % (ref 39.0–52.0)
Hemoglobin: 13.1 g/dL (ref 13.0–17.0)
Lymphocytes Relative: 12.1 % (ref 12.0–46.0)
Lymphs Abs: 0.6 10*3/uL — ABNORMAL LOW (ref 0.7–4.0)
MCHC: 33.5 g/dL (ref 30.0–36.0)
MCV: 94.8 fl (ref 78.0–100.0)
Monocytes Absolute: 0.7 10*3/uL (ref 0.1–1.0)
Monocytes Relative: 14.4 % — ABNORMAL HIGH (ref 3.0–12.0)
Neutro Abs: 3.2 10*3/uL (ref 1.4–7.7)
Neutrophils Relative %: 66.9 % (ref 43.0–77.0)
Platelets: 287 10*3/uL (ref 150.0–400.0)
RBC: 4.14 Mil/uL — ABNORMAL LOW (ref 4.22–5.81)
RDW: 15.8 % — ABNORMAL HIGH (ref 11.5–15.5)
WBC: 4.9 10*3/uL (ref 4.0–10.5)

## 2022-10-17 LAB — HEPATIC FUNCTION PANEL
ALT: 44 U/L (ref 0–53)
AST: 53 U/L — ABNORMAL HIGH (ref 0–37)
Albumin: 4 g/dL (ref 3.5–5.2)
Alkaline Phosphatase: 174 U/L — ABNORMAL HIGH (ref 39–117)
Bilirubin, Direct: 0.2 mg/dL (ref 0.0–0.3)
Total Bilirubin: 0.6 mg/dL (ref 0.2–1.2)
Total Protein: 7.5 g/dL (ref 6.0–8.3)

## 2022-10-17 MED ORDER — NA SULFATE-K SULFATE-MG SULF 17.5-3.13-1.6 GM/177ML PO SOLN: 1.0000 | Freq: Once | ORAL | 0 refills | Status: AC

## 2022-10-17 NOTE — Progress Notes (Signed)
HPI :  52 year old male with a history of congestive heart failure, EF as low as 15%, history of cirrhosis, here for a follow-up visit.  I have not seen him since March 2022.  He is followed by [facility] cardiology.  He states he is awaiting to be listed for heart transplant there for his heart failure.  As part of his workup for colon cancer screening he had a Cologuard that was performed in January of this year and was positive.  He has never had a prior colonoscopy and was here to discuss doing that.  He denies any problems with his bowels at baseline.  He states he does see a "drop or 2 of blood" in the stool or when wiping himself, this is a rare occurrence.  He denies any constipation or diarrhea.  No abdominal pains.  No nausea or vomiting.  He is eating well.  Weight is stable.  He states he is feeling pretty well from a cardiopulmonary standpoint.  He denies any exertional symptoms although he states he does not exert himself too much out of his normal daily life.  He is functioning okay without any limitations.  He has no family history of colon cancer.  His most recent echo was done within the past week, EF is depressed as outlined below.  He did have a right heart cath as well in December.  He denies any edema or shortness of breath at this time.  In regards to his cirrhosis this has been thought to be due to alcohol plus or minus component of CHF/cardiogenic.  He states he is not drinking any alcohol at this time.  He denies any history of jaundice.  No ascites.  No history of variceal bleeding.  No encephalopathy.  He is doing pretty well in this regard.  He had a CT scan in February which showed stable appearing cirrhotic liver with no mass lesions.  He reports he may need a "combined liver and heart transplant".  He is followed by [facility] cardiology closely but has not seen hepatology there yet.    Positive cologuard in January 2024  CT abdomen with and without contrast  08/04/22/: IMPRESSION: Stable 1.4 cm benign left adrenal adenoma (no followup imaging is recommended).   Prior left nephrectomy. No evidence of recurrent or metastatic disease.   Hepatic cirrhosis. No evidence of hepatic neoplasm or other acute findings.   Tiny nonobstructing right renal calculus.   Aortic Atherosclerosis (ICD10-I70.0).   Echo 10/10/22: SEVERE LV DYSFUNCTION (See above) Closest EF: <15%(Estimated)  Calc.EF: 26% (3D)    ELEVATED LA PRESSURES WITH DIASTOLIC DYSFUNCTION    MILD RV SYSTOLIC DYSFUNCTION (See above)    VALVULAR REGURGITATION: MILD MR, TRIVIAL PR, MODERATE TR    NO VALVULAR STENOSIS    TRIVIAL PERICARDIAL EFFUSION    MODERATE TO SEVERE TR    R heart cath 12/24: RA 7  PA mean 32  Wedge 24  CI 2.5  Hepatic wedge of 2 mmHg   Past Medical History:  Diagnosis Date   AICD (automatic cardioverter/defibrillator) present    Arthritis    Cardiomyopathy, nonischemic (HCC)    takes Digoxin daily and Carvedilol daily   CHF (congestive heart failure) (HCC)    takes Lasix daily as well Aldactone   Chronic back pain    Chronic systolic heart failure (HCC)    Cirrhosis (HCC)    Depression    takes Prozac daily   GERD (gastroesophageal reflux disease)    takes Omeprazole  daily   Hx of Alcohol consumption heavy    rare beer currently   hx of Tobacco abuse    quit smoking 2014   Hyperlipidemia    was on Simvastatin but has been off a year   Insomnia    takes Ambien as needed(just got script yesterday)   Orthostatic hypotension    Scoliosis    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 and chemo)     Past Surgical History:  Procedure Laterality Date   CARPAL TUNNEL RELEASE Right 01/09/2013   Procedure: CARPAL TUNNEL RELEASE;  Surgeon: Carmela Hurt, MD;  Location: [facility] NEURO ORS;  Service: Neurosurgery;  Laterality: Right;  RIGHT carpal tunnel release   CARPAL TUNNEL RELEASE Left 01/30/2013   Procedure: LEFT CARPAL TUNNEL RELEASE;  Surgeon: Carmela Hurt, MD;  Location: [facility]UR[facility] ORS;  Service: Neurosurgery;  Laterality: Left;  LEFT Carpal Tunnel release   CHEST TUBE INSERTION Right 09/09/2013   Procedure: INSERTION PLEURAL DRAINAGE CATHETER;  Surgeon: Kerin Perna, MD;  Location: [facility] OR;  Service: Thoracic;  Laterality: Right;   HYDROCELE EXCISION Right 04/11/2017   Procedure: RIGHT HYDROCELECTOMY ADULT;  Surgeon: Bjorn Pippin, MD;  Location: [facility] ORS;  Service: Urology;  Laterality: Right;   hydrocelectomy  2008   ICD  05/25/2011   [facility] Scientific [employer] SG lead/[employer]ner[employer] single chamber device   IMPLANTABLE CARDIOVERTER DEFIBRILLATOR IMPLANT N/A 05/25/2011   Procedure: IMPLANTABLE CARDIOVERTER DEFIBRILLATOR IMPLANT;  Surgeon: Marinus Maw, MD;  Location: [facility] CATH LAB;  Service: Cardiovascular;  Laterality: N/A;   NEPHRECTOMY  36 yrs ago   Wilms Tumor   RIGHT HEART CATH N/A 01/29/2019   Procedure: RIGHT HEART CATH;  Surgeon: Dolores Patty, MD;  Location: [facility] INVASIVE CV LAB;  Service: Cardiovascular;  Laterality: N/A;   RIGHT HEART CATH N/A 09/08/2019   Procedure: RIGHT HEART CATH;  Surgeon: Dolores Patty, MD;  Location: [facility] INVASIVE CV LAB;  Service: Cardiovascular;  Laterality: N/A;   RIGHT/LEFT HEART CATH AND CORONARY ANGIOGRAPHY N/A 02/12/2017   Procedure: RIGHT/LEFT HEART CATH AND CORONARY ANGIOGRAPHY;  Surgeon: Dolores Patty, MD;  Location: [facility] INVASIVE CV LAB;  Service: Cardiovascular;  Laterality: N/A;   ULNAR NERVE TRANSPOSITION Right 01/09/2013   Procedure: ULNAR NERVE DECOMPRESSION/TRANSPOSITION;  Surgeon: Carmela Hurt, MD;  Location: [facility];  Service: Neurosurgery;  Laterality: Right;  RIGHT ulnar nerve decompression   Family History  Problem Relation Age of Onset   Diabetes Mother    Colon cancer Neg Hx    Stomach cancer Neg Hx    Esophageal cancer Neg Hx    Social History   Tobacco Use   Smoking status: Former    Packs/day: 0.25    Years: 20.00    Additional pack years: 0.00    Total  pack years: 5.00    Types: Cigarettes    Quit date: 09/07/2012    Years since quitting: 10.1   Smokeless tobacco: Current    Types: Snuff  Vaping Use   Vaping Use: Never used  Substance Use Topics   Alcohol use: Yes    Alcohol/week: 0.0 standard drinks of alcohol    Comment: histoorically a heavy drinker ("beer only"), none for a couple months    Drug use: No   Current Outpatient Medications  Medication Sig Dispense Refill   aspirin 81 MG tablet Take 81 mg by mouth daily.     atorvastatin (LIPITOR) 20 MG tablet Take 1 tablet (20 mg  total) by mouth daily. 90 tablet 3   carvedilol (COREG) 3.125 MG tablet Take 1 tablet (3.125 mg total) by mouth 2 (two) times daily with a meal. 180 tablet 0   colchicine 0.6 MG tablet Take 0.6 mg by mouth as needed.     fluticasone (FLONASE) 50 MCG/ACT nasal spray Place 2 sprays into both nostrils daily. (Patient taking differently: Place 2 sprays into both nostrils daily as needed.) 16 g 6   FLUZONE QUADRIVALENT 0.5 ML injection      metolazone (ZAROXOLYN) 2.5 MG tablet Take 2.5 mg by mouth as needed.     oxyCODONE (OXY IR/ROXICODONE) 5 MG immediate release tablet Take 1 tablet (5 mg total) by mouth 3 (three) times daily as needed for severe pain. 60 tablet 0   polyethylene glycol (MIRALAX / GLYCOLAX) 17 g packet Take 17 g by mouth daily as needed for mild constipation. 14 each 0   potassium chloride SA (KLOR-CON M) 20 MEQ tablet Take 2 tablets (40 mEq total) by mouth daily. With an additional 40 meq with every metolazone dose 70 tablet 3   sacubitril-valsartan (ENTRESTO) 49-51 MG Take 1 tablet by mouth 2 (two) times daily. 180 tablet 3   spironolactone (ALDACTONE) 25 MG tablet Take 1 tablet (25 mg total) by mouth daily. 90 tablet 3   torsemide (DEMADEX) 20 MG tablet Take 3 tabs in the morning and 2 tabs in PM 150 tablet 6   No current facility-administered medications for this visit.   Allergies  Allergen Reactions   Ibuprofen Hives   Nsaids      ELEVATED LFT'S     Review of Systems: All systems reviewed and negative except where noted in HPI.   Labs reviewed in care everywhere  Physical Exam: BP 106/72   Pulse 85   Ht 5\' 7"  (1.702 m)   Wt 154 lb (69.9 kg)   BMI 24.12 kg/m  Constitutional: Pleasant,well-developed, male in no acute distress. HEENT: Normocephalic and atraumatic. Conjunctivae are normal. No scleral icterus. Neck supple.  Cardiovascular: Normal rate, regular rhythm.  Pulmonary/chest: Effort normal and breath sounds normal.  Abdominal: Soft, nondistended, nontender.  There are no masses palpable.  Extremities: no edema Lymphadenopathy: No cervical adenopathy noted. Neurological: Alert and oriented to person place and time. Skin: Skin is warm and dry. No rashes noted. Psychiatric: Normal mood and affect. Behavior is normal.   ASSESSMENT: 52 y.o. male here for assessment of the following  1. Cirrhosis of liver without ascites, unspecified hepatic cirrhosis type (HCC)   2. Chronic congestive heart failure, unspecified heart failure type (HCC)   3. Positive colorectal cancer screening using Cologuard test    As above, severely depressed ejection fraction followed by [facility] cardiology and hoping to be listed for heart transplant.  He is remarkably functional despite this and really denies any significant cardiopulmonary symptoms.  As part of his colon cancer screening he had a positive Cologuard test, he has never had a colonoscopy.  He states he needs this in order to be listed for heart transplant.  We discussed what colonoscopy is, risks and benefits of the procedure and anesthesia.  Given his cardiac history, he is higher risk for anesthesia, his case will need to be done at the hospital for anesthesia support.  Fortunately had an opening to accommodate him in a few weeks at [facility].  I think he is stable for this at present time.  He agrees and wishes to proceed.  Otherwise we reviewed his history  of cirrhosis.  He has been abstinent from alcohol and his liver disease has been compensated over time.  He is due for labs today to recalculate his MELD, and obtain AFP.  He will do that.  He is due for [facility] screening in August.  Given he is on Coreg he does not need EGD to screen for varices, he has had no history of bleeding.  His platelet count historically has actually been normal.  I explained to him that he should be seen by [facility] hepatology given his history of cirrhosis if he is pending a listing for heart transplant.  He will contact them and schedule an appointment.  He agrees with the plan as outlined  PLAN: - lab today for CBC, INR, AFP, LFTs - recall RUQ [place] in August - colonoscopy at the hospital @ [facility] May 22nd - needs to follow up with [facility] if considering dual liver / cardiac transplant - on Coreg - does not need variceal screening - complete alcohol abstinence  Harlin Rain, MD [facility]

## 2022-10-17 NOTE — H&P (View-Only) (Signed)
 HPI :  51-year-old male with a history of congestive heart failure, EF as low as 15%, history of cirrhosis, here for a follow-up visit.  I have not seen him since March 2022.  He is followed by [facility].  He states he is awaiting to be listed for heart transplant there for his heart failure.  As part of his workup for colon cancer screening he had a Cologuard that was performed in January of this year and was positive.  He has never had a prior colonoscopy and was here to discuss doing that.  He denies any problems with his bowels at baseline.  He states he does see a "drop or 2 of blood" in the stool or when wiping himself, this is a rare occurrence.  He denies any constipation or diarrhea.  No abdominal pains.  No nausea or vomiting.  He is eating well.  Weight is stable.  He states he is feeling pretty well from a cardiopulmonary standpoint.  He denies any exertional symptoms although he states he does not exert himself too much out of his normal daily life.  He is functioning okay without any limitations.  He has no family history of colon cancer.  His most recent echo was done within the past week, EF is depressed as outlined below.  He did have a right heart cath as well in December.  He denies any edema or shortness of breath at this time.  In regards to his cirrhosis this has been thought to be due to alcohol plus or minus component of CHF/cardiogenic.  He states he is not drinking any alcohol at this time.  He denies any history of jaundice.  No ascites.  No history of variceal bleeding.  No encephalopathy.  He is doing pretty well in this regard.  He had a CT scan in February which showed stable appearing cirrhotic liver with no mass lesions.  He reports he may need a "combined liver and heart transplant".  He is followed by [facility] closely but has not seen hepatology there yet.    Positive cologuard in January 2024  CT abdomen with and without contrast  08/04/22/: IMPRESSION: Stable 1.4 cm benign left adrenal adenoma (no followup imaging is recommended).   Prior left nephrectomy. No evidence of recurrent or metastatic disease.   Hepatic cirrhosis. No evidence of hepatic neoplasm or other acute findings.   Tiny nonobstructing right renal calculus.   Aortic Atherosclerosis (ICD10-I70.0).   Echo 10/10/22: SEVERE LV DYSFUNCTION (See above) Closest EF: <15%(Estimated)  Calc.EF: 26% (3D)    ELEVATED LA PRESSURES WITH DIASTOLIC DYSFUNCTION    MILD RV SYSTOLIC DYSFUNCTION (See above)    VALVULAR REGURGITATION: MILD MR, TRIVIAL PR, MODERATE TR    NO VALVULAR STENOSIS    TRIVIAL PERICARDIAL EFFUSION    MODERATE TO SEVERE TR    R heart cath 12/24: RA 7  PA mean 32  Wedge 24  CI 2.5  Hepatic wedge of 2 mmHg   Past Medical History:  Diagnosis Date   AICD (automatic cardioverter/defibrillator) present    Arthritis    Cardiomyopathy, nonischemic (HCC)    takes Digoxin daily and Carvedilol daily   CHF (congestive heart failure) (HCC)    takes Lasix daily as well Aldactone   Chronic back pain    Chronic systolic heart failure (HCC)    Cirrhosis (HCC)    Depression    takes Prozac daily   GERD (gastroesophageal reflux disease)    takes Omeprazole   daily   Hx of Alcohol consumption heavy    rare beer currently   hx of Tobacco abuse    quit smoking 2014   Hyperlipidemia    was on Simvastatin but has been off a year   Insomnia    takes Ambien as needed(just got script yesterday)   Orthostatic hypotension    Scoliosis    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 5 (XRT and chemo)     Past Surgical History:  Procedure Laterality Date   CARPAL TUNNEL RELEASE Right 01/09/2013   Procedure: CARPAL TUNNEL RELEASE;  Surgeon: Kyle L Cabbell, MD;  Location: [facility];  Service: Neurosurgery;  Laterality: Right;  RIGHT carpal tunnel release   CARPAL TUNNEL RELEASE Left 01/30/2013   Procedure: LEFT CARPAL TUNNEL RELEASE;  Surgeon: Kyle L  Cabbell, MD;  Location: [facility];  Service: Neurosurgery;  Laterality: Left;  LEFT Carpal Tunnel release   CHEST TUBE INSERTION Right 09/09/2013   Procedure: INSERTION PLEURAL DRAINAGE CATHETER;  Surgeon: Peter Van Trigt, MD;  Location: [facility] OR;  Service: Thoracic;  Laterality: Right;   HYDROCELE EXCISION Right 04/11/2017   Procedure: RIGHT HYDROCELECTOMY ADULT;  Surgeon: Wrenn, John, MD;  Location: [facility];  Service: Urology;  Laterality: Right;   hydrocelectomy  2008   ICD  05/25/2011   [employer] Endotak [employer]iance SG lead/Energen single chamber device   IMPLANTABLE CARDIOVERTER DEFIBRILLATOR IMPLANT N/A 05/25/2011   Procedure: IMPLANTABLE CARDIOVERTER DEFIBRILLATOR IMPLANT;  Surgeon: Gregg W Taylor, MD;  Location: [facility] CATH LAB;  Service: Cardiovascular;  Laterality: N/A;   NEPHRECTOMY  36 yrs ago   Wilms Tumor   RIGHT HEART CATH N/A 01/29/2019   Procedure: RIGHT HEART CATH;  Surgeon: [name], Daniel R, MD;  Location: [facility] IN[facility]AS[facility] CV [facility];  Service: Cardiovascular;  Laterality: N/A;   RIGHT HEART CATH N/A 09/08/2019   Procedure: RIGHT HEART CATH;  Surgeon: [name], Daniel R, MD;  Location: [facility];  Service: Cardiovascular;  Laterality: N/A;   RIGHT/LEFT HEART CATH AND CORONARY ANGIOGRAPHY N/A 02/12/2017   Procedure: RIGHT/LEFT HEART CATH AND CORONARY ANGIOGRAPHY;  Surgeon: [name], Daniel R, MD;  Location: [facility];  Service: Cardiovascular;  Laterality: N/A;   ULNAR NERVE TRANSPOSITION Right 01/09/2013   Procedure: ULNAR NERVE DECOMPRESSION/TRANSPOSITION;  Surgeon: Kyle L Cabbell, MD;  Location: [facility];  Service: Neurosurgery;  Laterality: Right;  RIGHT ulnar nerve decompression   Family History  Problem Relation Age of Onset   Diabetes Mother    Colon cancer Neg Hx    Stomach cancer Neg Hx    Esophageal cancer Neg Hx    Social History   Tobacco Use   Smoking status: Former    Packs/day: 0.25    Years: 20.00    Additional pack years: 0.00    Total  pack years: 5.00    Types: Cigarettes    Quit date: 09/07/2012    Years since quitting: 10.1   Smokeless tobacco: Current    Types: Snuff  Vaping Use   Vaping Use: Never used  Substance Use Topics   Alcohol use: Yes    Alcohol/week: 0.0 standard drinks of alcohol    Comment: histoorically a heavy drinker ("beer only"), none for a couple months    Drug use: No   Current Outpatient Medications  Medication Sig Dispense Refill   aspirin 81 MG tablet Take 81 mg by mouth daily.     atorvastatin (LIPITOR) 20 MG tablet Take 1 tablet (20 mg   total) by mouth daily. 90 tablet 3   carvedilol (COREG) 3.125 MG tablet Take 1 tablet (3.125 mg total) by mouth 2 (two) times daily with a meal. 180 tablet 0   colchicine 0.6 MG tablet Take 0.6 mg by mouth as needed.     fluticasone (FLONASE) 50 MCG/ACT nasal spray Place 2 sprays into both nostrils daily. (Patient taking differently: Place 2 sprays into both nostrils daily as needed.) 16 g 6   FLUZONE QUADRIVALENT 0.5 ML injection      metolazone (ZAROXOLYN) 2.5 MG tablet Take 2.5 mg by mouth as needed.     oxyCODONE (OXY IR/ROXICODONE) 5 MG immediate release tablet Take 1 tablet (5 mg total) by mouth 3 (three) times daily as needed for severe pain. 60 tablet 0   polyethylene glycol (MIRALAX / GLYCOLAX) 17 g packet Take 17 g by mouth daily as needed for mild constipation. 14 each 0   potassium chloride SA (KLOR-CON M) 20 MEQ tablet Take 2 tablets (40 mEq total) by mouth daily. With an additional 40 meq with every metolazone dose 70 tablet 3   sacubitril-valsartan (ENTRESTO) 49-51 MG Take 1 tablet by mouth 2 (two) times daily. 180 tablet 3   spironolactone (ALDACTONE) 25 MG tablet Take 1 tablet (25 mg total) by mouth daily. 90 tablet 3   torsemide (DEMADEX) 20 MG tablet Take 3 tabs in the morning and 2 tabs in PM 150 tablet 6   No current facility-administered medications for this visit.   Allergies  Allergen Reactions   Ibuprofen Hives   Nsaids      ELEVATED LFT'S     Review of Systems: All systems reviewed and negative except where noted in HPI.   Labs reviewed in care everywhere  Physical Exam: BP 106/72   Pulse 85   Ht 5' 7" (1.702 m)   Wt 154 lb (69.9 kg)   BMI 24.12 kg/m  Constitutional: Pleasant,well-developed, male in no acute distress. HEENT: Normocephalic and atraumatic. Conjunctivae are normal. No scleral icterus. Neck supple.  Cardiovascular: Normal rate, regular rhythm.  Pulmonary/chest: Effort normal and breath sounds normal.  Abdominal: Soft, nondistended, nontender.  There are no masses palpable.  Extremities: no edema Lymphadenopathy: No cervical adenopathy noted. Neurological: Alert and oriented to person place and time. Skin: Skin is warm and dry. No rashes noted. Psychiatric: Normal mood and affect. Behavior is normal.   ASSESSMENT: 51 y.o. male here for assessment of the following  1. Cirrhosis of liver without ascites, unspecified hepatic cirrhosis type (HCC)   2. Chronic congestive heart failure, unspecified heart failure type (HCC)   3. Positive colorectal cancer screening using Cologuard test    As above, severely depressed ejection fraction followed by [facility] and hoping to be listed for heart transplant.  He is remarkably functional despite this and really denies any significant cardiopulmonary symptoms.  As part of his colon cancer screening he had a positive Cologuard test, he has never had a colonoscopy.  He states he needs this in order to be listed for heart transplant.  We discussed what colonoscopy is, risks and benefits of the procedure and anesthesia.  Given his cardiac history, he is higher risk for anesthesia, his case will need to be done at the hospital for anesthesia support.  Fortunately had an opening to accommodate him in a few weeks at [facility].  I think he is stable for this at present time.  He agrees and wishes to proceed.  Otherwise we reviewed his history    of cirrhosis.  He has been abstinent from alcohol and his liver disease has been compensated over time.  He is due for labs today to recalculate his MELD, and obtain AFP.  He will do that.  He is due for HCC screening in August.  Given he is on Coreg he does not need EGD to screen for varices, he has had no history of bleeding.  His platelet count historically has actually been normal.  I explained to him that he should be seen by [facility] given his history of cirrhosis if he is pending a listing for heart transplant.  He will contact them and schedule an appointment.  He agrees with the plan as outlined  PLAN: - lab today for CBC, INR, AFP, LFTs - recall RUQ US in August - colonoscopy at the hospital @ [facility] May 22nd - needs to follow up with [facility] if considering dual liver / cardiac transplant - on Coreg - does not need variceal screening - complete alcohol abstinence  Steve Giamarie Bueche, MD  Gastroenterology 

## 2022-10-17 NOTE — Patient Instructions (Addendum)
If your blood pressure at your visit was 140/90 or greater, please contact your primary care physician to follow up on this. ______________________________________________________  If you are age 50 or older, your body mass index should be between 23-30. Your Body mass index is 24.12 kg/m. If this is out of the aforementioned range listed, please consider follow up with your Primary Care Provider.  If you are age 94 or younger, your body mass index should be between 19-25. Your Body mass index is 24.12 kg/m. If this is out of the aformentioned range listed, please consider follow up with your Primary Care Provider.  ________________________________________________________  The [facility] providers would like to encourage you to use [facility] to communicate with providers for non-urgent requests or questions.  Due to long hold times on the telephone, sending your provider a message by [facility] may be a faster and more efficient way to get a response.  Please allow 48 business hours for a response.  Please remember that this is for non-urgent requests.  _______________________________________________________  Due to recent changes in healthcare laws, you may see the results of your imaging and laboratory studies on MyChart before your provider has had a chance to review them.  We understand that in some cases there may be results that are confusing or concerning to you. Not all laboratory results come back in the same time frame and the provider may be waiting for multiple results in order to interpret others.  Please give [place] 48 hours in order for your provider to thoroughly review all the results before contacting the office for clarification of your results.   Please go to the lab in the basement of our building to have lab work done as you leave today. Hit "B" for basement when you get on the elevator.  When the doors open the lab is on your left.  We will call you with the results. Thank you.  You  have been scheduled for a colonoscopy at [facility] on May 22nd. Please follow written instructions given to you at your visit today.  Please pick up your prep supplies at the pharmacy within the next 1-3 days. If you use inhalers (even only as needed), please bring them with you on the day of your procedure.  You will be due for a liver ultrasound in August 2024.  We will remind you when it is time to get done.  Please follow up with [facility].  Thank you for entrusting me with your care and for choosing [facility], Dr. Ileene Patrick

## 2022-10-18 LAB — AFP TUMOR MARKER: AFP-Tumor Marker: 9.7 ng/mL — ABNORMAL HIGH (ref <6.1)

## 2022-10-22 ENCOUNTER — Other Ambulatory Visit: Payer: Self-pay

## 2022-10-22 ENCOUNTER — Other Ambulatory Visit (HOSPITAL_COMMUNITY): Payer: Self-pay | Admitting: Internal Medicine

## 2022-10-22 DIAGNOSIS — K7460 Unspecified cirrhosis of liver: Secondary | ICD-10-CM

## 2022-10-31 ENCOUNTER — Encounter (HOSPITAL_COMMUNITY)
Admission: RE | Disposition: A | Discharge status: Home / Self Care | Payer: Self-pay | Source: Home / Self Care | Attending: Gastroenterology

## 2022-10-31 ENCOUNTER — Ambulatory Visit (HOSPITAL_COMMUNITY)
Admission: RE | Admit: 2022-10-31 | Discharge: 2022-10-31 | Disposition: A | Discharge status: Home / Self Care | Payer: Medicare HMO | Attending: Gastroenterology | Admitting: Gastroenterology

## 2022-10-31 ENCOUNTER — Ambulatory Visit (HOSPITAL_BASED_OUTPATIENT_CLINIC_OR_DEPARTMENT_OTHER): Payer: Medicare HMO | Admitting: Anesthesiology

## 2022-10-31 ENCOUNTER — Encounter (HOSPITAL_COMMUNITY): Payer: Self-pay | Admitting: Gastroenterology

## 2022-10-31 ENCOUNTER — Ambulatory Visit (HOSPITAL_COMMUNITY): Payer: Medicare HMO | Admitting: Anesthesiology

## 2022-10-31 ENCOUNTER — Other Ambulatory Visit: Payer: Self-pay

## 2022-10-31 DIAGNOSIS — D126 Benign neoplasm of colon, unspecified: Secondary | ICD-10-CM | POA: Diagnosis not present

## 2022-10-31 DIAGNOSIS — N200 Calculus of kidney: Secondary | ICD-10-CM | POA: Insufficient documentation

## 2022-10-31 DIAGNOSIS — Z95810 Presence of automatic (implantable) cardiac defibrillator: Secondary | ICD-10-CM | POA: Insufficient documentation

## 2022-10-31 DIAGNOSIS — K639 Disease of intestine, unspecified: Secondary | ICD-10-CM

## 2022-10-31 DIAGNOSIS — K6289 Other specified diseases of anus and rectum: Secondary | ICD-10-CM | POA: Insufficient documentation

## 2022-10-31 DIAGNOSIS — Z7682 Awaiting organ transplant status: Secondary | ICD-10-CM | POA: Insufficient documentation

## 2022-10-31 DIAGNOSIS — D3502 Benign neoplasm of left adrenal gland: Secondary | ICD-10-CM | POA: Diagnosis not present

## 2022-10-31 DIAGNOSIS — Z87891 Personal history of nicotine dependence: Secondary | ICD-10-CM | POA: Diagnosis not present

## 2022-10-31 DIAGNOSIS — E119 Type 2 diabetes mellitus without complications: Secondary | ICD-10-CM | POA: Insufficient documentation

## 2022-10-31 DIAGNOSIS — R195 Other fecal abnormalities: Secondary | ICD-10-CM | POA: Diagnosis not present

## 2022-10-31 DIAGNOSIS — K7460 Unspecified cirrhosis of liver: Secondary | ICD-10-CM | POA: Insufficient documentation

## 2022-10-31 DIAGNOSIS — I110 Hypertensive heart disease with heart failure: Secondary | ICD-10-CM

## 2022-10-31 DIAGNOSIS — K648 Other hemorrhoids: Secondary | ICD-10-CM | POA: Insufficient documentation

## 2022-10-31 DIAGNOSIS — Z1211 Encounter for screening for malignant neoplasm of colon: Secondary | ICD-10-CM | POA: Diagnosis not present

## 2022-10-31 DIAGNOSIS — K6389 Other specified diseases of intestine: Secondary | ICD-10-CM | POA: Insufficient documentation

## 2022-10-31 DIAGNOSIS — Z833 Family history of diabetes mellitus: Secondary | ICD-10-CM | POA: Insufficient documentation

## 2022-10-31 DIAGNOSIS — Z905 Acquired absence of kidney: Secondary | ICD-10-CM | POA: Insufficient documentation

## 2022-10-31 DIAGNOSIS — M1990 Unspecified osteoarthritis, unspecified site: Secondary | ICD-10-CM | POA: Insufficient documentation

## 2022-10-31 DIAGNOSIS — I509 Heart failure, unspecified: Secondary | ICD-10-CM

## 2022-10-31 DIAGNOSIS — I5022 Chronic systolic (congestive) heart failure: Secondary | ICD-10-CM | POA: Diagnosis not present

## 2022-10-31 DIAGNOSIS — D123 Benign neoplasm of transverse colon: Secondary | ICD-10-CM | POA: Diagnosis not present

## 2022-10-31 DIAGNOSIS — K635 Polyp of colon: Secondary | ICD-10-CM | POA: Diagnosis not present

## 2022-10-31 DIAGNOSIS — I700 Atherosclerosis of aorta: Secondary | ICD-10-CM | POA: Diagnosis not present

## 2022-10-31 DIAGNOSIS — K219 Gastro-esophageal reflux disease without esophagitis: Secondary | ICD-10-CM | POA: Diagnosis not present

## 2022-10-31 HISTORY — PX: COLONOSCOPY WITH PROPOFOL: SHX5780

## 2022-10-31 HISTORY — PX: POLYPECTOMY: SHX5525

## 2022-10-31 HISTORY — PX: BIOPSY: SHX5522

## 2022-10-31 SURGERY — COLONOSCOPY WITH PROPOFOL
Anesthesia: Monitor Anesthesia Care

## 2022-10-31 MED ORDER — MIDAZOLAM HCL (PF) 5 MG/ML IJ SOLN
INTRAMUSCULAR | Status: DC | PRN
  Administered 2022-10-31: 1 mg via INTRAVENOUS
  Administered 2022-10-31: 2 mg via INTRAVENOUS

## 2022-10-31 MED ORDER — KETAMINE HCL 10 MG/ML IJ SOLN
INTRAMUSCULAR | Status: DC | PRN
  Administered 2022-10-31 (×2): 10 mg via INTRAVENOUS
  Administered 2022-10-31: 20 mg via INTRAVENOUS

## 2022-10-31 MED ORDER — KETAMINE HCL 50 MG/5ML IJ SOSY
PREFILLED_SYRINGE | INTRAMUSCULAR | Status: AC
  Filled 2022-10-31: qty 5

## 2022-10-31 MED ORDER — PROPOFOL 500 MG/50ML IV EMUL
INTRAVENOUS | Status: DC | PRN
  Administered 2022-10-31: 25 ug/kg/min via INTRAVENOUS

## 2022-10-31 MED ORDER — PHENYLEPHRINE 80 MCG/ML (10ML) SYRINGE FOR IV PUSH (FOR BLOOD PRESSURE SUPPORT)
PREFILLED_SYRINGE | INTRAVENOUS | Status: DC | PRN
  Administered 2022-10-31: 160 ug via INTRAVENOUS

## 2022-10-31 MED ORDER — MIDAZOLAM HCL (PF) 5 MG/ML IJ SOLN
INTRAMUSCULAR | Status: AC
  Filled 2022-10-31: qty 1

## 2022-10-31 MED ORDER — SODIUM CHLORIDE 0.9 % IV SOLN: INTRAVENOUS | Status: DC

## 2022-10-31 MED ORDER — LACTATED RINGERS IV SOLN: INTRAVENOUS | Status: DC | PRN

## 2022-10-31 MED ORDER — PHENYLEPHRINE HCL-NACL 20-0.9 MG/250ML-% IV SOLN
INTRAVENOUS | Status: DC | PRN
  Administered 2022-10-31: 30 ug/min via INTRAVENOUS

## 2022-10-31 SURGICAL SUPPLY — 22 items

## 2022-10-31 NOTE — Discharge Instructions (Signed)

## 2022-10-31 NOTE — Anesthesia Preprocedure Evaluation (Signed)
Anesthesia Evaluation  Patient identified by MRN, date of birth, ID band Patient awake    Reviewed: Allergy & Precautions, NPO status , Patient's Chart, lab work & pertinent test results  Airway Mallampati: II  TM Distance: >3 FB Neck ROM: Full    Dental   Pulmonary former smoker   breath sounds clear to auscultation       Cardiovascular hypertension, Pt. on medications and Pt. on home beta blockers +CHF  + Cardiac Defibrillator  Rhythm:Regular Rate:Normal     Neuro/Psych negative neurological ROS     GI/Hepatic ,GERD  ,,(+) Cirrhosis         Endo/Other  diabetes    Renal/GU Renal disease     Musculoskeletal  (+) Arthritis ,    Abdominal   Peds  Hematology negative hematology ROS (+)   Anesthesia Other Findings   Reproductive/Obstetrics                             Anesthesia Physical Anesthesia Plan  ASA: 4  Anesthesia Plan: MAC   Post-op Pain Management:    Induction:   PONV Risk Score and Plan: 1 and Propofol infusion and Ondansetron  Airway Management Planned: Natural Airway, Nasal Cannula and Simple Face Mask  Additional Equipment:   Intra-op Plan:   Post-operative Plan:   Informed Consent: I have reviewed the patients History and Physical, chart, labs and discussed the procedure including the risks, benefits and alternatives for the proposed anesthesia with the patient or authorized representative who has indicated his/her understanding and acceptance.       Plan Discussed with: CRNA  Anesthesia Plan Comments:        Anesthesia Quick Evaluation

## 2022-10-31 NOTE — Anesthesia Procedure Notes (Signed)
Procedure Name: General with mask airway Date/Time: 10/31/2022 8:38 AM  Performed by: Katina Degree, CRNAPre-anesthesia Checklist: Patient identified, Emergency Drugs available, Suction available and Patient being monitored Patient Re-evaluated:Patient Re-evaluated prior to induction Oxygen Delivery Method: Simple face mask Preoxygenation: Pre-oxygenation with 100% oxygen Induction Type: IV induction Dental Injury: Teeth and Oropharynx as per pre-operative assessment

## 2022-10-31 NOTE — Transfer of Care (Signed)
Immediate Anesthesia Transfer of Care Note  Patient: [name]  Procedure(s) Performed: COLONOSCOPY WITH PROPOFOL POLYPECTOMY HOT HEMOSTASIS (ARGON PLASMA COAGULATION/BICAP) BIOPSY  Patient Location: PACU  Anesthesia Type:MAC  Level of Consciousness: awake and alert   Airway & Oxygen Therapy: Patient Spontanous Breathing  Post-op Assessment: Report given to RN and Post -op Vital signs reviewed and stable  Post vital signs: Reviewed and stable  Last Vitals:  Vitals Value Taken Time  BP 96/60 10/31/22 0904  Temp    Pulse 88 10/31/22 0904  Resp 17 10/31/22 0904  SpO2 96 % 10/31/22 0904    Last Pain:  Vitals:   10/31/22 0904  TempSrc:   PainSc: 0-No pain         Complications: No notable events documented.

## 2022-10-31 NOTE — Op Note (Signed)
[facility] Patient Name: [name] Procedure Date : 10/31/2022 MRN: [id number] Attending MD: Willaim Rayas. Adela Lank , MD, [id number] Date of Birth: September 29, 1970 CSN: [id number] Age: 52 Admit Type: Outpatient Procedure:                Colonoscopy Indications:              Positive Cologuard test Providers:                Viviann Spare P. Adela Lank, MD, Marge Duncans, RN,                            Melany Guernsey, Technician Referring MD:             Willaim Rayas. Adela Lank, MD Medicines:                Monitored Anesthesia Care Complications:            No immediate complications. Estimated blood loss:                            Minimal. Estimated Blood Loss:     Estimated blood loss was minimal. Procedure:                Pre-Anesthesia Assessment:                           - Prior to the procedure, a History and Physical                            was performed, and patient medications and                            allergies were reviewed. The patient's tolerance of                            previous anesthesia was also reviewed. The risks                            and benefits of the procedure and the sedation                            options and risks were discussed with the patient.                            All questions were answered, and informed consent                            was obtained. Prior Anticoagulants: The patient has                            taken no anticoagulant or antiplatelet agents. ASA                            Grade Assessment: IV - A patient with severe  systemic disease that is a constant threat to life.                            After reviewing the risks and benefits, the patient                            was deemed in satisfactory condition to undergo the                            procedure.                           After obtaining informed consent, the colonoscope                            was passed  under direct vision. Throughout the                            procedure, the patient's blood pressure, pulse, and                            oxygen saturations were monitored continuously. The                            [id number]-HQ190L ([id number] Olympus colonoscope was                            introduced through the anus and advanced to the the                            cecum, identified by appendiceal orifice and                            ileocecal valve. The colonoscopy was performed                            without difficulty. The patient tolerated the                            procedure well. The quality of the bowel                            preparation was good. The ileocecal valve,                            appendiceal orifice, and rectum were photographed. Scope In: 8:34:10 AM Scope Out: 8:57:01 AM Scope Withdrawal Time: 0 hours 17 minutes 31 seconds  Total Procedure Duration: 0 hours 22 minutes 51 seconds  Findings:      The perianal and digital rectal examinations were normal.      A 4 mm polyp was found in the hepatic flexure. The polyp was sessile.       The polyp was removed with a cold snare. It was removed during cecal       intubation. Upon withdrawal there was persistent  oozing at the       polypectomy site which was unexpected given how small the lesion was.       Snare tip coagulation was applied to the base of the site with immediate       hemostasis. Resection and retrieval were complete.      An area of mucosa was found in the descending colon that was slightly       altered - roughly 45 cm from the anal verge in the reduced position.       Very restricted colon in this area, some mucosal change I suspect either       benign hyperplastic or related to portal HTN. Biopsies were taken with a       cold forceps for histology to ensure no flat adenomatous change.      Congested mucosa was found in the rectum, in the sigmoid colon and in       the descending colon  likely due to portal hypertension.      Internal hemorrhoids were found during retroflexion.      The exam was otherwise without abnormality. Retroflexed views of the       rectum not obtained due to small size of the rectum. Impression:               - One 4 mm polyp at the hepatic flexure, removed                            with a cold snare. Resected and retrieved.                           - Altered mucosa in the descending colon. Biopsied                            to ensure no flat adenoma, as described.                           - Congested mucosa in the rectum, in the sigmoid                            colon and in the descending colon.                           - Internal hemorrhoids.                           - The examination was otherwise normal. Recommendation:           - Patient has a contact number available for                            emergencies. The signs and symptoms of potential                            delayed complications were discussed with the                            patient. Return to normal activities tomorrow.  Written discharge instructions were provided to the                            patient.                           - Resume previous diet.                           - Continue present medications.                           - Await pathology results. Procedure Code(s):        --- Professional ---                           [phone], Colonoscopy, flexible; with removal of                            tumor(s), polyp(s), or other lesion(s) by snare                            technique                           [id number], 59, Colonoscopy, flexible; with biopsy,                            single or multiple Diagnosis Code(s):        --- Professional ---                           D12.3, Benign neoplasm of transverse colon (hepatic                            flexure or splenic flexure)                           K63.89, Other specified  diseases of intestine                           K62.89, Other specified diseases of anus and rectum                           K64.8, Other hemorrhoids                           R19.5, Other fecal abnormalities CPT copyright 2022 American Medical Association. All rights reserved. The codes documented in this report are preliminary and upon coder review may  be revised to meet current compliance requirements. Viviann Spare P. Adela Lank, MD 10/31/2022 9:04:21 AM This report has been signed electronically. Number of Addenda: 0

## 2022-10-31 NOTE — Interval H&P Note (Signed)
History and Physical Interval Note: No interval changes since clinic visit. Here for colonoscopy for positive Cologuard. Awaiting cardiac transplant. Case done at the hospital given his cardiac history. I have discussed risks / benefits of the exam with him and he wishes to proceed.   10/31/2022 8:17 AM  Lovina Reach  has presented today for surgery, with the diagnosis of positive Cologuard, CHF.  The various methods of treatment have been discussed with the patient and family. After consideration of risks, benefits and other options for treatment, the patient has consented to  Procedure(s): COLONOSCOPY WITH PROPOFOL (N/A) as a surgical intervention.  The patient's history has been reviewed, patient examined, no change in status, stable for surgery.  I have reviewed the patient's chart and labs.  Questions were answered to the patient's satisfaction.     Viviann Spare P Sallyanne Birkhead

## 2022-11-01 ENCOUNTER — Encounter: Payer: Self-pay | Admitting: Gastroenterology

## 2022-11-01 LAB — SURGICAL PATHOLOGY

## 2022-11-01 NOTE — Anesthesia Postprocedure Evaluation (Signed)
Anesthesia Post Note  Patient: Lovina Reach  Procedure(s) Performed: COLONOSCOPY WITH PROPOFOL POLYPECTOMY HOT HEMOSTASIS (ARGON PLASMA COAGULATION/BICAP) BIOPSY     Patient location during evaluation: PACU Anesthesia Type: MAC Level of consciousness: awake and alert Pain management: pain level controlled Vital Signs Assessment: post-procedure vital signs reviewed and stable Respiratory status: spontaneous breathing, nonlabored ventilation, respiratory function stable and patient connected to nasal cannula oxygen Cardiovascular status: stable and blood pressure returned to baseline Postop Assessment: no apparent nausea or vomiting Anesthetic complications: no   No notable events documented.  Last Vitals:  Vitals:   10/31/22 0910 10/31/22 0920  BP: (!) 88/65 105/76  Pulse: 91 87  Resp: 17 18  Temp: (!) 36.3 C   SpO2: 93% 98%    Last Pain:  Vitals:   10/31/22 0920  TempSrc:   PainSc: 0-No pain   Pain Goal:                   Kennieth Rad

## 2022-11-04 ENCOUNTER — Encounter (HOSPITAL_COMMUNITY): Payer: Self-pay | Admitting: Gastroenterology

## 2022-11-08 ENCOUNTER — Ambulatory Visit (INDEPENDENT_AMBULATORY_CARE_PROVIDER_SITE_OTHER): Payer: Medicaid Other

## 2022-11-08 DIAGNOSIS — I472 Ventricular tachycardia, unspecified: Secondary | ICD-10-CM

## 2022-11-08 LAB — CUP PACEART REMOTE DEVICE CHECK
Battery Remaining Longevity: 30 mo
Battery Remaining Percentage: 30 %
Brady Statistic RV Percent Paced: 0 %
Date Time Interrogation Session: 20240530032200
HighPow Impedance: 77 Ohm
Implantable Lead Connection Status: 753985
Implantable Lead Implant Date: 20121214
Implantable Lead Location: 753860
Implantable Lead Model: 180
Implantable Lead Serial Number: 307189
Implantable Pulse Generator Implant Date: 20121214
Lead Channel Impedance Value: 535 Ohm
Lead Channel Pacing Threshold Amplitude: 0.7 V
Lead Channel Pacing Threshold Pulse Width: 0.4 ms
Lead Channel Setting Pacing Amplitude: 2.4 V
Lead Channel Setting Pacing Pulse Width: 0.4 ms
Lead Channel Setting Sensing Sensitivity: 0.4 mV
Pulse Gen Serial Number: 118994

## 2022-11-12 ENCOUNTER — Other Ambulatory Visit: Payer: Self-pay | Admitting: Family Medicine

## 2022-11-12 ENCOUNTER — Telehealth: Payer: Self-pay

## 2022-11-12 MED ORDER — OXYCODONE HCL 5 MG PO TABS: 5.0000 mg | ORAL_TABLET | Freq: Three times a day (TID) | ORAL | 0 refills | Status: DC | PRN

## 2022-11-12 NOTE — Telephone Encounter (Signed)
Prescription Request  11/12/2022  LOV: 07/13/22  What is the name of the medication or equipment? oxyCODONE (OXY IR/ROXICODONE) 5 MG immediate release tablet [[id number]]  Have you contacted your pharmacy to request a refill? No   Which pharmacy would you like this sent to?  [facility] [address] [place] - [address] [place] [address] Phone: [phone] Fax: [phone]    Patient notified that their request is being sent to the clinical staff for review and that they should receive a response within 2 business days.   Please advise at [facility] [phone]

## 2022-11-12 NOTE — Progress Notes (Signed)
PDMP reviewed with no concerning findings. Refill x1 provided.

## 2022-11-23 ENCOUNTER — Other Ambulatory Visit (HOSPITAL_COMMUNITY): Payer: Self-pay | Admitting: Internal Medicine

## 2022-11-27 NOTE — Progress Notes (Signed)
Remote ICD transmission.   

## 2022-12-10 ENCOUNTER — Other Ambulatory Visit: Payer: Self-pay | Admitting: Family Medicine

## 2022-12-10 MED ORDER — OXYCODONE HCL 5 MG PO TABS: 5.0000 mg | ORAL_TABLET | Freq: Three times a day (TID) | ORAL | 0 refills | Status: DC | PRN

## 2022-12-10 NOTE — Telephone Encounter (Signed)
Prescription Request  12/10/2022  LOV: 06/15/2022  What is the name of the medication or equipment?   oxyCODONE (OXY IR/ROXICODONE) 5 MG immediate release tablet   Have you contacted your pharmacy to request a refill? Yes   Which pharmacy would you like this sent to?  [facility] [address] [place] - [address] [place] [address] Phone: [phone] Fax: [phone]    Patient notified that their request is being sent to the clinical staff for review and that they should receive a response within 2 business days.   Please advise patient when refill sent at [phone].

## 2022-12-19 ENCOUNTER — Ambulatory Visit (HOSPITAL_COMMUNITY)
Admission: RE | Admit: 2022-12-19 | Discharge: 2022-12-19 | Disposition: A | Discharge status: Home / Self Care | Payer: Medicare HMO | Source: Ambulatory Visit | Attending: Internal Medicine | Admitting: Internal Medicine

## 2022-12-19 ENCOUNTER — Encounter (HOSPITAL_COMMUNITY): Payer: Self-pay | Admitting: Internal Medicine

## 2022-12-19 VITALS — BP 90/50 | HR 81 | Wt 152.2 lb

## 2022-12-19 DIAGNOSIS — G8929 Other chronic pain: Secondary | ICD-10-CM | POA: Diagnosis not present

## 2022-12-19 DIAGNOSIS — I071 Rheumatic tricuspid insufficiency: Secondary | ICD-10-CM

## 2022-12-19 DIAGNOSIS — I509 Heart failure, unspecified: Secondary | ICD-10-CM | POA: Diagnosis not present

## 2022-12-19 DIAGNOSIS — K7469 Other cirrhosis of liver: Secondary | ICD-10-CM | POA: Diagnosis not present

## 2022-12-19 DIAGNOSIS — Z79899 Other long term (current) drug therapy: Secondary | ICD-10-CM | POA: Diagnosis not present

## 2022-12-19 DIAGNOSIS — Z95810 Presence of automatic (implantable) cardiac defibrillator: Secondary | ICD-10-CM | POA: Insufficient documentation

## 2022-12-19 DIAGNOSIS — Z87891 Personal history of nicotine dependence: Secondary | ICD-10-CM | POA: Insufficient documentation

## 2022-12-19 DIAGNOSIS — I480 Paroxysmal atrial fibrillation: Secondary | ICD-10-CM

## 2022-12-19 DIAGNOSIS — J90 Pleural effusion, not elsewhere classified: Secondary | ICD-10-CM | POA: Insufficient documentation

## 2022-12-19 DIAGNOSIS — I428 Other cardiomyopathies: Secondary | ICD-10-CM | POA: Diagnosis not present

## 2022-12-19 DIAGNOSIS — Z7982 Long term (current) use of aspirin: Secondary | ICD-10-CM | POA: Insufficient documentation

## 2022-12-19 DIAGNOSIS — G4700 Insomnia, unspecified: Secondary | ICD-10-CM | POA: Insufficient documentation

## 2022-12-19 DIAGNOSIS — I472 Ventricular tachycardia, unspecified: Secondary | ICD-10-CM | POA: Diagnosis not present

## 2022-12-19 DIAGNOSIS — K7460 Unspecified cirrhosis of liver: Secondary | ICD-10-CM | POA: Insufficient documentation

## 2022-12-19 DIAGNOSIS — K766 Portal hypertension: Secondary | ICD-10-CM | POA: Diagnosis not present

## 2022-12-19 DIAGNOSIS — Z905 Acquired absence of kidney: Secondary | ICD-10-CM | POA: Insufficient documentation

## 2022-12-19 DIAGNOSIS — Z85528 Personal history of other malignant neoplasm of kidney: Secondary | ICD-10-CM | POA: Insufficient documentation

## 2022-12-19 DIAGNOSIS — K219 Gastro-esophageal reflux disease without esophagitis: Secondary | ICD-10-CM | POA: Insufficient documentation

## 2022-12-19 DIAGNOSIS — R55 Syncope and collapse: Secondary | ICD-10-CM | POA: Diagnosis not present

## 2022-12-19 DIAGNOSIS — M419 Scoliosis, unspecified: Secondary | ICD-10-CM | POA: Insufficient documentation

## 2022-12-19 DIAGNOSIS — E785 Hyperlipidemia, unspecified: Secondary | ICD-10-CM | POA: Diagnosis not present

## 2022-12-19 DIAGNOSIS — I5022 Chronic systolic (congestive) heart failure: Secondary | ICD-10-CM | POA: Insufficient documentation

## 2022-12-19 DIAGNOSIS — Z923 Personal history of irradiation: Secondary | ICD-10-CM | POA: Insufficient documentation

## 2022-12-19 LAB — COMPREHENSIVE METABOLIC PANEL
ALT: 31 U/L (ref 0–44)
AST: 38 U/L (ref 15–41)
Albumin: 3.3 g/dL — ABNORMAL LOW (ref 3.5–5.0)
Alkaline Phosphatase: 267 U/L — ABNORMAL HIGH (ref 38–126)
Anion gap: 10 (ref 5–15)
BUN: 41 mg/dL — ABNORMAL HIGH (ref 6–20)
CO2: 30 mmol/L (ref 22–32)
Calcium: 8.8 mg/dL — ABNORMAL LOW (ref 8.9–10.3)
Chloride: 93 mmol/L — ABNORMAL LOW (ref 98–111)
Creatinine, Ser: 1.84 mg/dL — ABNORMAL HIGH (ref 0.61–1.24)
GFR, Estimated: 44 mL/min — ABNORMAL LOW (ref >60)
Glucose, Bld: 136 mg/dL — ABNORMAL HIGH (ref 70–99)
Potassium: 3.9 mmol/L (ref 3.5–5.1)
Sodium: 133 mmol/L — ABNORMAL LOW (ref 135–145)
Total Bilirubin: 0.8 mg/dL (ref 0.3–1.2)
Total Protein: 7 g/dL (ref 6.5–8.1)

## 2022-12-19 LAB — BRAIN NATRIURETIC PEPTIDE: B Natriuretic Peptide: 1554.2 pg/mL — ABNORMAL HIGH (ref 0.0–100.0)

## 2022-12-19 LAB — MAGNESIUM: Magnesium: 2.2 mg/dL (ref 1.7–2.4)

## 2022-12-19 NOTE — H&P (View-Only) (Signed)
Advanced Heart Failure Clinic Note    PCP: Donita Brooks, MD Primary Cardiologist: Dr Diona Browner HF Cardiologist: Dr Gala Romney [name]: Dr Edwena Blow  HPI: [name] is a 52 y.o. male with history of NICM (diagnosed 10/2010 - norm cors 2012 and 9/18), chronic systolic heart failure EF 15% (08/2013), S/P ICD [employer], smoker, Wilms tumor s/p nephrectomy at age 40 had chemoradiation, scoliosis.    We saw him at the end of March 2015 and had large recurrent R pleural effusion. Referred to Dr. Donata Clay who placed Pleurex catheter on 4/10, removed on 09/28/13.   CPX (5/15) with RER 1.06, VO2 max 18.5, VE/VCO2 slope 32.1.  Moderate to severe functional limitation, circulatory.  CPX 09/17/16 Peak VO2: 20.5 (53% predicted peak VO2) Slope 35 Underwent L/RHC on 02/12/17. Had normal coronaries and relatively well-compensated hemodynamics. EF 20%. Echo 6/20 EF 15% RV mild HK with severe TR Personally reviewed CPX 8/20 with pVO2 17.1 (down from 20.5)  Slope 35 (stable).   CT abdomen 10/20 c/w cirrhosis. Paracentesis 03/02/19: 3.7 L off. No need for repeat paracenteses  Has been evaluated at [facility] by Dr Edwena Blow for possible transplant. Seen last in 4/21 - felt too early for transplant but getting closer.  Echo 12/17/20: LVEF < 20% RV normal severe TR  Admitted 3/23 due to volume overload, fever and cough. Purulent sinus drainage. Underwent IV diuresis.  But also received abx. Echo 09/04/21 unchanged. LVEF < 20% G3DD. Severe TR. Entresto and spiro stopped due to low BP  He was seen in the HF clinic 4/23. CPX repeat and referred to [facility]   On 03/27/22 had VT with successful ATP. EP reviewed and therapies were appropriate. He was unaware of the event. Called by EP and given [place] DMV no driving for 6 months.   Echo 05/22/22: EF <15%, mild RV systolic dysfunction mild to mod MR, mod TR, RVSP 51 mmHg  RHC 05/24/22: RA 7 PA 47/24 (32) W 24 CI 2.5 PVR 1.8 hepatic wedge 10    Seen by GI for  cirrhosis. CT scan in 2/24 which showed stable appearing cirrhotic liver with no mass lesions. Found to have benign left adrenal adenoma. Urine studies ruled out pheochromocytoma.   Colonoscopy 10/31/22 congested mucosa due to portal HTN. + hemorrhoids. No mass.  Last seen at [facility] Transplant clinic 10/10/22. Ongoing w/u for heart +/- liver transplant. Requesting repeat CPX.   He is here for f/u. Feels great. Doing anything he wants. Very active. Remodeling his house, fishing, landscaping. Occasioanl SOB if he gets hot or pushes it. Denies edema, orthopnea ot PND. No ETOH. 1 episode of presyncope in the heat  Takes metolazone about 1x/month.   Cardiac Studies  - RHC (12/23): RA 7 PA 47/24 (32) W 24 CI 2.5 PVR 1.8 hepatic wedge 10    - Echo (12/23): EF <15%, mild RV systolic dysfunction mild to mod MR, mod TR, RVSP 51 mmHg  - CPX (7/23) Pre-Exercise PFTs  FVC 2.79 (61%)      FEV1 2.00 (55%)        FEV1/FVC 72 (89%)        MVV 84 (58%)  Resting HR: 87 Standing HR: 88 Peak HR: 141   (82% age predicted max HR)  BP rest: 90/68 Standing BP: 86/68 BP peak: 96/62  Peak VO2: 14.5 (40% predicted peak VO2)  VeVCo slope 35  - CPX 12/17/20 FVC 2.41 (49%)      FEV1 1.51 (38%)  FEV1/FVC 63 (78%)        MVV 62 (40%)  BP rest: 106/60 Standing BP: 100/62 BP peak: 104/62  Peak VO2: 18.3 (50% predicted peak VO2)  VE/VCO2 slope:  38  Peak RER: 1.10  Ventilatory Threshold: 15.6 (42% predicted or measured peak VO2)  VE/MVV:  84%   - RHC 8/20  RA = 17 RV = 46/18 PA = 51/22 (33) PCW = 28 Fick cardiac output/index =4.0/2.2 PVR =1.0 WU Ao sat = 99% PA sat = 62%, 62% RA/PCWP = 0.61 PaPI = 1.71  SH: Lives alone. Unemployed. On disability. Does not drink alcohol.  FH: Grandfather MI  ROS: All systems negative except as listed in HPI, PMH and Problem List.  Past Medical History:  Diagnosis Date   AICD (automatic cardioverter/defibrillator) present    Arthritis    Cardiomyopathy,  nonischemic (HCC)    takes Digoxin daily and Carvedilol daily   CHF (congestive heart failure) (HCC)    takes Lasix daily as well Aldactone   Chronic back pain    Chronic systolic heart failure (HCC)    Cirrhosis (HCC)    Depression    takes Prozac daily   GERD (gastroesophageal reflux disease)    takes Omeprazole daily   Hx of Alcohol consumption heavy    rare beer currently   hx of Tobacco abuse    quit smoking 2014   Hyperlipidemia    was on Simvastatin but has been off a year   Insomnia    takes Ambien as needed(just got script yesterday)   Orthostatic hypotension    Scoliosis    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 33 (XRT and chemo)   Current Outpatient Medications  Medication Sig Dispense Refill   aspirin 81 MG tablet Take 81 mg by mouth daily.     atorvastatin (LIPITOR) 20 MG tablet Take 1 tablet (20 mg total) by mouth daily. 90 tablet 3   carvedilol (COREG) 3.125 MG tablet Take 1 tablet (3.125 mg total) by mouth 2 (two) times daily with a meal. 180 tablet 0   colchicine 0.6 MG tablet Take 0.6 mg by mouth as needed (gout flare).     loratadine (CLARITIN) 10 MG tablet Take 10 mg by mouth daily.     oxyCODONE (OXY IR/ROXICODONE) 5 MG immediate release tablet Take 1 tablet (5 mg total) by mouth 3 (three) times daily as needed for severe pain. 60 tablet 0   polyethylene glycol (MIRALAX / GLYCOLAX) 17 g packet Take 17 g by mouth daily as needed for mild constipation. 14 each 0   Potassium Chloride ER 20 MEQ TBCR Take 40 mEq by mouth as needed. When he takes metolazone     sacubitril-valsartan (ENTRESTO) 49-51 MG Take 1 tablet by mouth 2 (two) times daily. 180 tablet 3   spironolactone (ALDACTONE) 25 MG tablet Take 1 tablet (25 mg total) by mouth daily. 90 tablet 3   torsemide (DEMADEX) 20 MG tablet TAKE 3 TABLETS BY MOUTH IN THE MORNING AND 2 TABLETS IN THE EVENING 150 tablet 3   No current facility-administered medications for this encounter.   BP (!) 90/50   Pulse 81   Wt 69  kg (152 lb 3.2 oz)   SpO2 99%   BMI 23.84 kg/m   Wt Readings from Last 3 Encounters:  12/19/22 69 kg (152 lb 3.2 oz)  10/31/22 68 kg (150 lb)  10/17/22 69.9 kg (154 lb)   PHYSICAL EXAM: General:  Well appearing. No resp difficulty  HEENT: normal Neck: supple. no JVD. Carotids 2+ bilat; no bruits. No lymphadenopathy or thryomegaly appreciated. Cor: PMI laterally displaced. Regular rate & rhythm. 2/6 MR/TR  Lungs: clear Abdomen: soft, nontender, very mildly distended. No hepatosplenomegaly. No bruits or masses. Good bowel sounds. Extremities: no cyanosis, clubbing, rash, edema Neuro: alert & orientedx3, cranial nerves grossly intact. moves all 4 extremities w/o difficulty. Affect pleasant   ECG: NSR 77 + frequent PVCs Personally reviewed   Device interrogation (personally reviewed): ~3 hr/day activity, average HR 81 bpm. Several episodes  NSVT in May and June lasting 5-9 sec. No sustained. Personally reviewed   ASSESSMENT & PLAN: 1. Chronic Systolic HF:   - Nonischemic cardiomyopathy - Echo 10/15 EF 10%.  - Echo 2/18 EF 15% RV mild HK  [employer] ICD.   - Echo 6/20 EF 15% RV normal severe TR - Cath 9/18: normal cors. Relatively well-compensated hemodynmaics - W/u has revealed evidence of cirrhosis which I suspect is due to a combination of ETOH and RV failure/severe TR. Synthetic function currently seems ok (Albumin 4.2  INR 1.3 on 9/20) - Doubt TV repair will be sufficient for him so question will be if he will still be a transplant candidate. GI has seen and feels liver disease likely not severe enough to be a barrier to transplant.  May need [facility] team to weigh in as well. - He saw Dr. Edwena Blow in 4/21 who felt we would need to proceed with advanced therapies sooner than later in his disease given cirrhosis but still felt he was a bit early. Recommended close f/u with CPX. - Admitted (3/23) with ADHF in setting of URI - Echo (3/23): EF < 20% G3DD. Severe TR  - CPX  7/23 pVO2 14.5 (40%) slope 35% Flat BP response  - Echo (12/23 at [facility]): EF <15%, mild RV systolic dysfunction mild to mod MR, mod TR, RVSP 51 mmHg - RHC (12/23 at [facility]): RA 7 PA 47/24 (32) W 24 CI 2.5 PVR 1.8 hepatic wedge 10   - Improved NYHA II, volume OK today. - Continue torsemide 60 mg qam/40 mg qpm + 40 KCL daily.+ PRN metolazone - Continue Entresto 49/51 mg bid (low BP with increase dose)  - Continue carvedilol 3.125 mg bid (failed titration of 6.25/3.125) for now. May need to stop if BP drops further.  - Continue spironolactone 25 mg daily.  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 - Unable to tolerate digoxin due to heartburn. - Blood type A +. - He is followed at [facility] transplant center for transplant work up. Will repeat CPX testing to further risk stratify and guide timing of transplant listing.  - Labs today  2. Severe TR - Echo with moderate to severe TR but TV seems structurally intact. ? If related to ICD wire or just dilated annulus.  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   - w/u as above - Appreciate GI input. May need heart/liver tx  4. VT - 03/27/22 had successful ATP. Appropriate therapy for VT.   - ICD interrogated in clinic personally. Multiple episodes of recent brief NSVT. No sustained VT. Check electrolytes.  Arvilla Meres, MD  9:53 AM

## 2022-12-19 NOTE — Progress Notes (Addendum)
Advanced Heart Failure Clinic Note    PCP: Donita Brooks, MD Primary Cardiologist: Dr Diona Browner HF Cardiologist: Dr Gala Romney [name]: Dr Edwena Blow  HPI: [name] is a 52 y.o. male with history of NICM (diagnosed 10/2010 - norm cors 2012 and 9/18), chronic systolic heart failure EF 15% (08/2013), S/P ICD [employer], smoker, Wilms tumor s/p nephrectomy at age 40 had chemoradiation, scoliosis.    We saw him at the end of March 2015 and had large recurrent R pleural effusion. Referred to Dr. Donata Clay who placed Pleurex catheter on 4/10, removed on 09/28/13.   CPX (5/15) with RER 1.06, VO2 max 18.5, VE/VCO2 slope 32.1.  Moderate to severe functional limitation, circulatory.  CPX 09/17/16 Peak VO2: 20.5 (53% predicted peak VO2) Slope 35 Underwent L/RHC on 02/12/17. Had normal coronaries and relatively well-compensated hemodynamics. EF 20%. Echo 6/20 EF 15% RV mild HK with severe TR Personally reviewed CPX 8/20 with pVO2 17.1 (down from 20.5)  Slope 35 (stable).   CT abdomen 10/20 c/w cirrhosis. Paracentesis 03/02/19: 3.7 L off. No need for repeat paracenteses  Has been evaluated at [facility] by Dr Edwena Blow for possible transplant. Seen last in 4/21 - felt too early for transplant but getting closer.  Echo 12/17/20: LVEF < 20% RV normal severe TR  Admitted 3/23 due to volume overload, fever and cough. Purulent sinus drainage. Underwent IV diuresis.  But also received abx. Echo 09/04/21 unchanged. LVEF < 20% G3DD. Severe TR. Entresto and spiro stopped due to low BP  He was seen in the HF clinic 4/23. CPX repeat and referred to [facility]   On 03/27/22 had VT with successful ATP. EP reviewed and therapies were appropriate. He was unaware of the event. Called by EP and given [place] DMV no driving for 6 months.   Echo 05/22/22: EF <15%, mild RV systolic dysfunction mild to mod MR, mod TR, RVSP 51 mmHg  RHC 05/24/22: RA 7 PA 47/24 (32) W 24 CI 2.5 PVR 1.8 hepatic wedge 10    Seen by GI for  cirrhosis. CT scan in 2/24 which showed stable appearing cirrhotic liver with no mass lesions. Found to have benign left adrenal adenoma. Urine studies ruled out pheochromocytoma.   Colonoscopy 10/31/22 congested mucosa due to portal HTN. + hemorrhoids. No mass.  Last seen at [facility] Transplant clinic 10/10/22. Ongoing w/u for heart +/- liver transplant. Requesting repeat CPX.   He is here for f/u. Feels great. Doing anything he wants. Very active. Remodeling his house, fishing, landscaping. Occasioanl SOB if he gets hot or pushes it. Denies edema, orthopnea ot PND. No ETOH. 1 episode of presyncope in the heat  Takes metolazone about 1x/month.   Cardiac Studies  - RHC (12/23): RA 7 PA 47/24 (32) W 24 CI 2.5 PVR 1.8 hepatic wedge 10    - Echo (12/23): EF <15%, mild RV systolic dysfunction mild to mod MR, mod TR, RVSP 51 mmHg  - CPX (7/23) Pre-Exercise PFTs  FVC 2.79 (61%)      FEV1 2.00 (55%)        FEV1/FVC 72 (89%)        MVV 84 (58%)  Resting HR: 87 Standing HR: 88 Peak HR: 141   (82% age predicted max HR)  BP rest: 90/68 Standing BP: 86/68 BP peak: 96/62  Peak VO2: 14.5 (40% predicted peak VO2)  VeVCo slope 35  - CPX 12/17/20 FVC 2.41 (49%)      FEV1 1.51 (38%)  FEV1/FVC 63 (78%)        MVV 62 (40%)  BP rest: 106/60 Standing BP: 100/62 BP peak: 104/62  Peak VO2: 18.3 (50% predicted peak VO2)  VE/VCO2 slope:  38  Peak RER: 1.10  Ventilatory Threshold: 15.6 (42% predicted or measured peak VO2)  VE/MVV:  84%   - RHC 8/20  RA = 17 RV = 46/18 PA = 51/22 (33) PCW = 28 Fick cardiac output/index =4.0/2.2 PVR =1.0 WU Ao sat = 99% PA sat = 62%, 62% RA/PCWP = 0.61 PaPI = 1.71  SH: Lives alone. Unemployed. On disability. Does not drink alcohol.  FH: Grandfather MI  ROS: All systems negative except as listed in HPI, PMH and Problem List.  Past Medical History:  Diagnosis Date   AICD (automatic cardioverter/defibrillator) present    Arthritis    Cardiomyopathy,  nonischemic (HCC)    takes Digoxin daily and Carvedilol daily   CHF (congestive heart failure) (HCC)    takes Lasix daily as well Aldactone   Chronic back pain    Chronic systolic heart failure (HCC)    Cirrhosis (HCC)    Depression    takes Prozac daily   GERD (gastroesophageal reflux disease)    takes Omeprazole daily   Hx of Alcohol consumption heavy    rare beer currently   hx of Tobacco abuse    quit smoking 2014   Hyperlipidemia    was on Simvastatin but has been off a year   Insomnia    takes Ambien as needed(just got script yesterday)   Orthostatic hypotension    Scoliosis    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 33 (XRT and chemo)   Current Outpatient Medications  Medication Sig Dispense Refill   aspirin 81 MG tablet Take 81 mg by mouth daily.     atorvastatin (LIPITOR) 20 MG tablet Take 1 tablet (20 mg total) by mouth daily. 90 tablet 3   carvedilol (COREG) 3.125 MG tablet Take 1 tablet (3.125 mg total) by mouth 2 (two) times daily with a meal. 180 tablet 0   colchicine 0.6 MG tablet Take 0.6 mg by mouth as needed (gout flare).     loratadine (CLARITIN) 10 MG tablet Take 10 mg by mouth daily.     oxyCODONE (OXY IR/ROXICODONE) 5 MG immediate release tablet Take 1 tablet (5 mg total) by mouth 3 (three) times daily as needed for severe pain. 60 tablet 0   polyethylene glycol (MIRALAX / GLYCOLAX) 17 g packet Take 17 g by mouth daily as needed for mild constipation. 14 each 0   Potassium Chloride ER 20 MEQ TBCR Take 40 mEq by mouth as needed. When he takes metolazone     sacubitril-valsartan (ENTRESTO) 49-51 MG Take 1 tablet by mouth 2 (two) times daily. 180 tablet 3   spironolactone (ALDACTONE) 25 MG tablet Take 1 tablet (25 mg total) by mouth daily. 90 tablet 3   torsemide (DEMADEX) 20 MG tablet TAKE 3 TABLETS BY MOUTH IN THE MORNING AND 2 TABLETS IN THE EVENING 150 tablet 3   No current facility-administered medications for this encounter.   BP (!) 90/50   Pulse 81   Wt 69  kg (152 lb 3.2 oz)   SpO2 99%   BMI 23.84 kg/m   Wt Readings from Last 3 Encounters:  12/19/22 69 kg (152 lb 3.2 oz)  10/31/22 68 kg (150 lb)  10/17/22 69.9 kg (154 lb)   PHYSICAL EXAM: General:  Well appearing. No resp difficulty  HEENT: normal Neck: supple. no JVD. Carotids 2+ bilat; no bruits. No lymphadenopathy or thryomegaly appreciated. Cor: PMI laterally displaced. Regular rate & rhythm. 2/6 MR/TR  Lungs: clear Abdomen: soft, nontender, very mildly distended. No hepatosplenomegaly. No bruits or masses. Good bowel sounds. Extremities: no cyanosis, clubbing, rash, edema Neuro: alert & orientedx3, cranial nerves grossly intact. moves all 4 extremities w/o difficulty. Affect pleasant   ECG: NSR 77 + frequent PVCs Personally reviewed   Device interrogation (personally reviewed): ~3 hr/day activity, average HR 81 bpm. Several episodes  NSVT in May and June lasting 5-9 sec. No sustained. Personally reviewed   ASSESSMENT & PLAN: 1. Chronic Systolic HF:   - Nonischemic cardiomyopathy - Echo 10/15 EF 10%.  - Echo 2/18 EF 15% RV mild HK  [employer] ICD.   - Echo 6/20 EF 15% RV normal severe TR - Cath 9/18: normal cors. Relatively well-compensated hemodynmaics - W/u has revealed evidence of cirrhosis which I suspect is due to a combination of ETOH and RV failure/severe TR. Synthetic function currently seems ok (Albumin 4.2  INR 1.3 on 9/20) - Doubt TV repair will be sufficient for him so question will be if he will still be a transplant candidate. GI has seen and feels liver disease likely not severe enough to be a barrier to transplant.  May need [facility] team to weigh in as well. - He saw Dr. Edwena Blow in 4/21 who felt we would need to proceed with advanced therapies sooner than later in his disease given cirrhosis but still felt he was a bit early. Recommended close f/u with CPX. - Admitted (3/23) with ADHF in setting of URI - Echo (3/23): EF < 20% G3DD. Severe TR  - CPX  7/23 pVO2 14.5 (40%) slope 35% Flat BP response  - Echo (12/23 at [facility]): EF <15%, mild RV systolic dysfunction mild to mod MR, mod TR, RVSP 51 mmHg - RHC (12/23 at [facility]): RA 7 PA 47/24 (32) W 24 CI 2.5 PVR 1.8 hepatic wedge 10   - Improved NYHA II, volume OK today. - Continue torsemide 60 mg qam/40 mg qpm + 40 KCL daily.+ PRN metolazone - Continue Entresto 49/51 mg bid (low BP with increase dose)  - Continue carvedilol 3.125 mg bid (failed titration of 6.25/3.125) for now. May need to stop if BP drops further.  - Continue spironolactone 25 mg daily.  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 - Unable to tolerate digoxin due to heartburn. - Blood type A +. - He is followed at [facility] transplant center for transplant work up. Will repeat CPX testing to further risk stratify and guide timing of transplant listing.  - Labs today  2. Severe TR - Echo with moderate to severe TR but TV seems structurally intact. ? If related to ICD wire or just dilated annulus.  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   - w/u as above - Appreciate GI input. May need heart/liver tx  4. VT - 03/27/22 had successful ATP. Appropriate therapy for VT.   - ICD interrogated in clinic personally. Multiple episodes of recent brief NSVT. No sustained VT. Check electrolytes.  Arvilla Meres, MD  9:53 AM

## 2022-12-19 NOTE — Patient Instructions (Addendum)
Medication Changes:  No Changes In Medications at this time.   Lab Work:  Labs done today, your results will be available in MyChart, we will contact you for abnormal readings.   Testing/Procedures:  ON JULY 22nd AT 11AM  Expect to be in the lab for 2 hours. Please plan to arrive 30 minutes prior to your appointment. You may be asked to reschedule your test if you arrive 20 minutes or more after your scheduled appointment time.  Main Campus address: [address] [place], [place] [address] You may arrive to the Main Entrance A or Entrance C (free valet parking is available at both). -Main Entrance A (on [address]) :proceed to admitting for check in -Entrance C (on [facility]): proceed to [employer] parking or under hospital deck parking using this code _________  Check In: [facility] waiting room (1st floor)   General Instructions for the day of the test (Please follow all instructions from your physician): Refrain from ingesting a heavy meal, alcohol, or caffeine or using tobacco products within 2 hours of the test (DO NOT FAST for mare than 8 hours). You may have all other non-alcoholic, non -caffeinated beverage,a light snack (crackers,a piece of fruit, carrot sticks, toast bagel,etc) up to your appointment. Avoid significant exertion or exercise within 24 hours of your test. Be prepared to exercise and sweat. Your clothing should permit freedom of movement and include walking or running shoes. Women bring loose fitting short sleeved blouse.  This evaluation may be fatiguing and you may wish ti have someone accompany you to the assessment to drive you home afterward. Bring a list of your medications with you, including dosage and frequency you take the medications (  I.e.,once per day, twice per day, etc). Take all medications as prescribed, unless noted below or instructed to do so by your physician.  Please do not take the following medications prior to your  CPX:  _________________________________________________  _________________________________________________  Brief description of the test: A brief lung test will be performed. This will involve you taking deep breaths and blowing hard and fast through your mouth. During these , a clip will be on your nose and you will be breathing through a breathing device.   For the exercise portion of the test you will be walking on a treadmill, or riding a stationary bike, to your maximal effor or until symptoms such as chest pain, shortness of breath, leg pain or dizziness limit your exercise. You will be breathing in and out of a breathing device through your mouth (a clip will be on your nose again). Your heart rate, ECG, blood pressure, oxygen saturations, breathing rate and depth, amount of oxygen you consume and amount of carbon dioxide you produce will be measured and monitored throughout the exercise test.  If you need to cancel or reschedule your appointment please call [phone] If you have further questions please call your physician or Philip Aspen, MS, ACSM-RCEP at [phone]   Referrals:  PLEASE CALL Dr. Bruna Potter OFFICE TO GET SCHEDULED- NUMBER IS [phone]-   Follow-Up in: 4 MONTH FOLLOW UP WITH Dr. Gala Romney PLEASE CALL OUR OFFICE AROUND SEPTEMBER 2024 TO GET SCHEDULED FOR YOUR APPOINTMENT. PHONE NUMBER IS [phone] OPTION 2   At the [facility], you and your health needs are our priority. We have a designated team specialized in the treatment of Heart Failure. This Care Team includes your primary Heart Failure Specialized Cardiologist (physician), Advanced Practice Providers (APPs- Physician Assistants and  Nurse Practitioners), and Pharmacist who all work together to provide you with the care you need, when you need it.   You may see any of the following providers on your designated Care Team at your next follow up:  Dr. Arvilla Meres Dr. Marca Ancona Dr.  Marcos Eke, NP Robbie Lis, [place] [facility] [place], [place] Brynda Peon, NP Karle Plumber, PharmD   Please be sure to bring in all your medications bottles to every appointment.   Need to Contact us:  If you have any questions or concerns before your next appointment please send [place] a message through [place] or call our office at [phone].    TO LEAVE A MESSAGE FOR THE NURSE SELECT OPTION 2, PLEASE LEAVE A MESSAGE INCLUDING: YOUR NAME DATE OF BIRTH CALL BACK NUMBER REASON FOR CALL**this is important as we prioritize the call backs  YOU WILL RECEIVE A CALL BACK THE SAME DAY AS LONG AS YOU CALL BEFORE 4:00 PM

## 2022-12-24 ENCOUNTER — Encounter: Payer: Self-pay | Admitting: Internal Medicine

## 2022-12-24 ENCOUNTER — Telehealth (HOSPITAL_COMMUNITY): Payer: Self-pay | Admitting: *Deleted

## 2022-12-24 NOTE — Telephone Encounter (Signed)
Left voicemail to remind patient of CPX appointment on Monday, December 31, 2022 at 11am.    Reggy Eye, MS, ACSM, [facility] Clinical Exercise Physiologist/ Health and Wellness Coach

## 2022-12-25 ENCOUNTER — Encounter: Payer: Self-pay | Admitting: Pharmacist

## 2022-12-25 NOTE — Progress Notes (Signed)
Patient previously followed by [facility] pharmacist. Per clinical review, no pharmacist appointment needed at this time. Care guide directed to contact patient and cancel appointment and notify pharmacy team of any patient concerns.  

## 2022-12-27 ENCOUNTER — Encounter: Payer: Medicare HMO | Admitting: Pharmacist

## 2022-12-31 ENCOUNTER — Ambulatory Visit (HOSPITAL_COMMUNITY): Payer: Medicare HMO | Attending: Cardiology

## 2022-12-31 ENCOUNTER — Other Ambulatory Visit (HOSPITAL_COMMUNITY): Payer: Self-pay | Admitting: *Deleted

## 2022-12-31 DIAGNOSIS — I509 Heart failure, unspecified: Secondary | ICD-10-CM | POA: Diagnosis not present

## 2022-12-31 DIAGNOSIS — R9439 Abnormal result of other cardiovascular function study: Secondary | ICD-10-CM | POA: Diagnosis not present

## 2023-01-10 ENCOUNTER — Telehealth: Payer: Self-pay | Admitting: Family Medicine

## 2023-01-10 ENCOUNTER — Other Ambulatory Visit: Payer: Self-pay | Admitting: Family Medicine

## 2023-01-10 ENCOUNTER — Ambulatory Visit: Payer: Medicare HMO | Attending: Internal Medicine | Admitting: Internal Medicine

## 2023-01-10 VITALS — BP 100/74 | HR 90 | Ht 69.0 in | Wt 160.2 lb

## 2023-01-10 DIAGNOSIS — I472 Ventricular tachycardia, unspecified: Secondary | ICD-10-CM

## 2023-01-10 MED ORDER — OXYCODONE HCL 5 MG PO TABS: 5.0000 mg | ORAL_TABLET | Freq: Three times a day (TID) | ORAL | 0 refills | Status: DC | PRN

## 2023-01-10 NOTE — Progress Notes (Signed)
HPI [name] returns today for folllowup. He is a pleasant 52 yo man with a h/o chronic systolic heart failure, s/p ICD insertion, cirrhosis, TR and on maximal medical therapy. He has class 3 symptoms. He has not had chest pain or edema. He has not had any more ICD therapies. He has undergone transplant eval and is on the waiting list. Allergies  Allergen Reactions   Ibuprofen Hives   Nsaids     ELEVATED LFT'S     Current Outpatient Medications  Medication Sig Dispense Refill   aspirin 81 MG tablet Take 81 mg by mouth daily.     atorvastatin (LIPITOR) 20 MG tablet Take 1 tablet (20 mg total) by mouth daily. 90 tablet 3   carvedilol (COREG) 3.125 MG tablet Take 1 tablet (3.125 mg total) by mouth 2 (two) times daily with a meal. 180 tablet 0   colchicine 0.6 MG tablet Take 0.6 mg by mouth as needed (gout flare).     loratadine (CLARITIN) 10 MG tablet Take 10 mg by mouth daily.     oxyCODONE (OXY IR/ROXICODONE) 5 MG immediate release tablet Take 1 tablet (5 mg total) by mouth 3 (three) times daily as needed for severe pain. 60 tablet 0   polyethylene glycol (MIRALAX / GLYCOLAX) 17 g packet Take 17 g by mouth daily as needed for mild constipation. 14 each 0   Potassium Chloride ER 20 MEQ TBCR Take 40 mEq by mouth as needed. When he takes metolazone     sacubitril-valsartan (ENTRESTO) 49-51 MG Take 1 tablet by mouth 2 (two) times daily. 180 tablet 3   spironolactone (ALDACTONE) 25 MG tablet Take 1 tablet (25 mg total) by mouth daily. 90 tablet 3   torsemide (DEMADEX) 20 MG tablet TAKE 3 TABLETS BY MOUTH IN THE MORNING AND 2 TABLETS IN THE EVENING 150 tablet 3   No current facility-administered medications for this visit.     Past Medical History:  Diagnosis Date   AICD (automatic cardioverter/defibrillator) present    Arthritis    Cardiomyopathy, nonischemic (HCC)    takes Digoxin daily and Carvedilol daily   CHF (congestive heart failure) (HCC)    takes Lasix daily as well  Aldactone   Chronic back pain    Chronic systolic heart failure (HCC)    Cirrhosis (HCC)    Depression    takes Prozac daily   GERD (gastroesophageal reflux disease)    takes Omeprazole daily   Hx of Alcohol consumption heavy    rare beer currently   hx of Tobacco abuse    quit smoking 2014   Hyperlipidemia    was on Simvastatin but has been off a year   Insomnia    takes Ambien as needed(just got script yesterday)   Orthostatic hypotension    Scoliosis    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 55 (XRT and chemo)    ROS:   All systems reviewed and negative except as noted in the HPI.   Past Surgical History:  Procedure Laterality Date   BIOPSY  10/31/2022   Procedure: BIOPSY;  Surgeon: Benancio Deeds, MD;  Location: [facility] ENDOSCOPY;  Service: Gastroenterology;;   Fidela Salisbury RELEASE Right 01/09/2013   Procedure: CARPAL TUNNEL RELEASE;  Surgeon: Carmela Hurt, MD;  Location: [facility] NEURO ORS;  Service: Neurosurgery;  Laterality: Right;  RIGHT carpal tunnel release   CARPAL TUNNEL RELEASE Left 01/30/2013   Procedure: LEFT CARPAL TUNNEL RELEASE;  Surgeon: Carmela Hurt,  MD;  Location: [facility] NEURO ORS;  Service: Neurosurgery;  Laterality: Left;  LEFT Carpal Tunnel release   CHEST TUBE INSERTION Right 09/09/2013   Procedure: INSERTION PLEURAL DRAINAGE CATHETER;  Surgeon: Kerin Perna, MD;  [facility] OR;  Service: Thoracic;  Laterality: Right;   COLONOSCOPY WITH PROPOFOL N/A 10/31/2022   Procedure: COLONOSCOPY WITH PROPOFOL;  Surgeon: Benancio Deeds, MD;  Location: [facility] ENDOSCOPY;  Service: Gastroenterology;  Laterality: N/A;   HYDROCELE EXCISION Right 04/11/2017   Procedure: RIGHT HYDROCELECTOMY ADULT;  Surgeon: Bjorn Pippin, MD;  Location: [facility] ORS;  Service: Urology;  Laterality: Right;   hydrocelectomy  2008   ICD  05/25/2011   [facility] [employer] SG lead/[employer] single chamber device   IMPLANTABLE CARDIOVERTER DEFIBRILLATOR IMPLANT N/A 05/25/2011   Procedure:  IMPLANTABLE CARDIOVERTER DEFIBRILLATOR IMPLANT;  Surgeon: Marinus Maw, MD;  Location: [facility] CATH LAB;  Service: Cardiovascular;  Laterality: N/A;   NEPHRECTOMY  36 yrs ago   Wilms Tumor   POLYPECTOMY  10/31/2022   Procedure: POLYPECTOMY;  Surgeon: Benancio Deeds, MD;  Location: [facility] ENDOSCOPY;  Service: Gastroenterology;;   RIGHT HEART CATH N/A 01/29/2019   Procedure: RIGHT HEART CATH;  Surgeon: Dolores Patty, MD;  Location: [facility] INVASIVE CV LAB;  Service: Cardiovascular;  Laterality: N/A;   RIGHT HEART CATH N/A 09/08/2019   Procedure: RIGHT HEART CATH;  Surgeon: Dolores Patty, MD;  Location: [facility] INVASIVE CV LAB;  Service: Cardiovascular;  Laterality: N/A;   RIGHT/LEFT HEART CATH AND CORONARY ANGIOGRAPHY N/A 02/12/2017   Procedure: RIGHT/LEFT HEART CATH AND CORONARY ANGIOGRAPHY;  Surgeon: Dolores Patty, MD;  Location: [facility] INVASIVE CV LAB;  Service: Cardiovascular;  Laterality: N/A;   ULNAR NERVE TRANSPOSITION Right 01/09/2013   Procedure: ULNAR NERVE DECOMPRESSION/TRANSPOSITION;  Surgeon: Carmela Hurt, MD;  Location: [facility] NEURO ORS;  Service: Neurosurgery;  Laterality: Right;  RIGHT ulnar nerve decompression     Family History  Problem Relation Age of Onset   Diabetes Mother    Colon cancer Neg Hx    Stomach cancer Neg Hx    Esophageal cancer Neg Hx      Social History   Socioeconomic History   Marital status: Single    Spouse name: Not on file   Number of children: 1   Years of education: Not on file   Highest education level: Not on file  Occupational History   Occupation: Full time    Comment: Engineer, petroleum  Tobacco Use   Smoking status: Former    Current packs/day: 0.00    Average packs/day: 0.3 packs/day for 20.0 years (5.0 ttl pk-yrs)    Types: Cigarettes    Start date: 09/07/1992    Quit date: 09/07/2012    Years since quitting: 10.3   Smokeless tobacco: Current    Types: Snuff  Vaping Use   Vaping status: Never Used  Substance and Sexual Activity    Alcohol use: Yes    Alcohol/week: 0.0 standard drinks of alcohol    Comment: histoorically a heavy drinker ("beer only"), none for a couple months    Drug use: No   Sexual activity: Not Currently  Other Topics Concern   Not on file  Social History Narrative   Not on file   Social Determinants of Health   Financial Resource Strain: Low Risk  (07/13/2022)   Overall Financial Resource Strain (CARDIA)    Difficulty of Paying Living Expenses: Not hard at all  Food Insecurity: No Food Insecurity (07/13/2022)   Hunger  Vital Sign    Worried About Programme researcher, broadcasting/film/video in the Last Year: Never true    Ran Out of Food in the Last Year: Never true  Transportation Needs: No Transportation Needs (07/13/2022)   PRAPARE - Administrator, Civil Service (Medical): No    Lack of Transportation (Non-Medical): No  Physical Activity: Inactive (07/13/2022)   Exercise Vital Sign    Days of Exercise per Week: 0 days    Minutes of Exercise per Session: 0 min  Stress: No Stress Concern Present (07/13/2022)   [employer] of Occupational Health - Occupational Stress Questionnaire    Feeling of Stress : Not at all  Social Connections: Moderately Isolated (07/13/2022)   Social Connection and Isolation Panel [NHANES]    Frequency of Communication with Friends and Family: Three times a week    Frequency of Social Gatherings with Friends and Family: Three times a week    Attends Religious Services: Never    Active Member of Clubs or Organizations: Yes    Attends Banker Meetings: Never    Marital Status: Never married  Intimate Partner Violence: Not At Risk (07/13/2022)   Humiliation, Afraid, Rape, and Kick questionnaire    Fear of Current or Ex-Partner: No    Emotionally Abused: No    Physically Abused: No    Sexually Abused: No     BP 100/74 (BP Location: Right Arm, Patient Position: Sitting, Cuff Size: Normal)   Pulse 90   Ht 5\' 9"  (1.753 m)   Wt 160 lb 3.2 oz (72.7 kg)   SpO2 98%    BMI 23.66 kg/m   Physical Exam:  stable appearing NAD HEENT: Unremarkable Neck:  No JVD, no thyromegally Lymphatics:  No adenopathy Back:  No CVA tenderness Lungs:  Clear with no wheezes HEART:  Regular rate rhythm, no murmurs, no rubs, no clicks Abd:  soft, positive bowel sounds, no organomegally, no rebound, no guarding Ext:  2 plus pulses, no edema, no cyanosis, no clubbing Skin:  No rashes no nodules Neuro:  CN II through XII intact, motor grossly intact  DEVICE  Normal device function.  See PaceArt for details.   Assess/Plan:  1. Chronic systolic heart failure - he has class 2 symptoms and is tolerating maximal medical therapy. He will continue his current meds and maintain a low sodium diet. 2. Cirrhosis - he is well compensated and has mild peripheral edema and minimal abdominal swelling. This is thought due to stasis from his CHF. 3. VT - he has not had any recent episodes. We will follow.   Sharlot Gowda Seena Face,MD

## 2023-01-10 NOTE — Telephone Encounter (Signed)
Prescription Request  01/10/2023  LOV: 06/15/2022  What is the name of the medication or equipment?   oxyCODONE (OXY IR/ROXICODONE) 5 MG immediate release tablet   Have you contacted your pharmacy to request a refill? Yes   Which pharmacy would you like this sent to?  [facility] [address], [place] - [address] [place] [address] Phone: [phone] Fax: [phone]    Patient notified that their request is being sent to the clinical staff for review and that they should receive a response within 2 business days.   Please advise patient at [phone].

## 2023-01-10 NOTE — Patient Instructions (Signed)

## 2023-01-14 ENCOUNTER — Other Ambulatory Visit (HOSPITAL_COMMUNITY): Payer: Self-pay | Admitting: *Deleted

## 2023-01-14 ENCOUNTER — Telehealth (HOSPITAL_COMMUNITY): Payer: Self-pay | Admitting: *Deleted

## 2023-01-14 ENCOUNTER — Telehealth: Payer: Self-pay

## 2023-01-14 DIAGNOSIS — I5022 Chronic systolic (congestive) heart failure: Secondary | ICD-10-CM | POA: Diagnosis not present

## 2023-01-14 DIAGNOSIS — I428 Other cardiomyopathies: Secondary | ICD-10-CM | POA: Diagnosis not present

## 2023-01-14 DIAGNOSIS — I361 Nonrheumatic tricuspid (valve) insufficiency: Secondary | ICD-10-CM | POA: Diagnosis not present

## 2023-01-14 DIAGNOSIS — Z01818 Encounter for other preprocedural examination: Secondary | ICD-10-CM | POA: Diagnosis not present

## 2023-01-14 DIAGNOSIS — Z7682 Awaiting organ transplant status: Secondary | ICD-10-CM | POA: Diagnosis not present

## 2023-01-14 NOTE — Telephone Encounter (Signed)
-----   Message from [facility] sent at 10/17/2022 10:01 AM EDT ----- Regarding: RUQ U/S Patient due for RUQ U/S in August

## 2023-01-14 NOTE — Telephone Encounter (Signed)
U/S scheduled for 8-13 at 10:30am.

## 2023-01-14 NOTE — Telephone Encounter (Signed)
Per Dr Leory Plowman, pt seen at [facility] today and cr up, ? Concern for low output, pt sch for RHC tomorrow 8/6 at 7:30 am, pt aware and all instructions reviewed with him

## 2023-01-15 ENCOUNTER — Inpatient Hospital Stay (HOSPITAL_COMMUNITY): Payer: Medicare HMO

## 2023-01-15 ENCOUNTER — Inpatient Hospital Stay (HOSPITAL_COMMUNITY)
Admission: RE | Admit: 2023-01-15 | Discharge: 2023-01-17 | DRG: 286 | Disposition: A | Discharge status: Home Health | Payer: Medicare HMO | Source: Ambulatory Visit | Attending: Internal Medicine | Admitting: Internal Medicine

## 2023-01-15 ENCOUNTER — Encounter (HOSPITAL_COMMUNITY): Payer: Self-pay | Admitting: Internal Medicine

## 2023-01-15 ENCOUNTER — Encounter (HOSPITAL_COMMUNITY)
Admission: RE | Disposition: A | Discharge status: Home Health | Payer: Self-pay | Source: Ambulatory Visit | Attending: Internal Medicine

## 2023-01-15 ENCOUNTER — Other Ambulatory Visit: Payer: Self-pay

## 2023-01-15 DIAGNOSIS — K766 Portal hypertension: Secondary | ICD-10-CM | POA: Diagnosis present

## 2023-01-15 DIAGNOSIS — E860 Dehydration: Secondary | ICD-10-CM | POA: Diagnosis not present

## 2023-01-15 DIAGNOSIS — Z87891 Personal history of nicotine dependence: Secondary | ICD-10-CM | POA: Diagnosis not present

## 2023-01-15 DIAGNOSIS — D3502 Benign neoplasm of left adrenal gland: Secondary | ICD-10-CM | POA: Diagnosis present

## 2023-01-15 DIAGNOSIS — I071 Rheumatic tricuspid insufficiency: Secondary | ICD-10-CM | POA: Diagnosis not present

## 2023-01-15 DIAGNOSIS — I472 Ventricular tachycardia, unspecified: Secondary | ICD-10-CM | POA: Diagnosis present

## 2023-01-15 DIAGNOSIS — Z95810 Presence of automatic (implantable) cardiac defibrillator: Secondary | ICD-10-CM

## 2023-01-15 DIAGNOSIS — K219 Gastro-esophageal reflux disease without esophagitis: Secondary | ICD-10-CM | POA: Diagnosis present

## 2023-01-15 DIAGNOSIS — Z8249 Family history of ischemic heart disease and other diseases of the circulatory system: Secondary | ICD-10-CM

## 2023-01-15 DIAGNOSIS — Z905 Acquired absence of kidney: Secondary | ICD-10-CM

## 2023-01-15 DIAGNOSIS — G8929 Other chronic pain: Secondary | ICD-10-CM | POA: Diagnosis present

## 2023-01-15 DIAGNOSIS — I493 Ventricular premature depolarization: Secondary | ICD-10-CM | POA: Diagnosis present

## 2023-01-15 DIAGNOSIS — K649 Unspecified hemorrhoids: Secondary | ICD-10-CM | POA: Diagnosis present

## 2023-01-15 DIAGNOSIS — Z9221 Personal history of antineoplastic chemotherapy: Secondary | ICD-10-CM

## 2023-01-15 DIAGNOSIS — I5022 Chronic systolic (congestive) heart failure: Secondary | ICD-10-CM

## 2023-01-15 DIAGNOSIS — I5023 Acute on chronic systolic (congestive) heart failure: Principal | ICD-10-CM | POA: Diagnosis present

## 2023-01-15 DIAGNOSIS — K7460 Unspecified cirrhosis of liver: Secondary | ICD-10-CM | POA: Diagnosis present

## 2023-01-15 DIAGNOSIS — M549 Dorsalgia, unspecified: Secondary | ICD-10-CM | POA: Diagnosis present

## 2023-01-15 DIAGNOSIS — E785 Hyperlipidemia, unspecified: Secondary | ICD-10-CM | POA: Diagnosis not present

## 2023-01-15 DIAGNOSIS — F32A Depression, unspecified: Secondary | ICD-10-CM | POA: Diagnosis present

## 2023-01-15 DIAGNOSIS — R339 Retention of urine, unspecified: Secondary | ICD-10-CM | POA: Diagnosis present

## 2023-01-15 DIAGNOSIS — Z85528 Personal history of other malignant neoplasm of kidney: Secondary | ICD-10-CM

## 2023-01-15 DIAGNOSIS — I428 Other cardiomyopathies: Secondary | ICD-10-CM | POA: Diagnosis present

## 2023-01-15 DIAGNOSIS — N170 Acute kidney failure with tubular necrosis: Secondary | ICD-10-CM | POA: Diagnosis not present

## 2023-01-15 DIAGNOSIS — N179 Acute kidney failure, unspecified: Secondary | ICD-10-CM | POA: Diagnosis not present

## 2023-01-15 DIAGNOSIS — N3289 Other specified disorders of bladder: Secondary | ICD-10-CM | POA: Diagnosis not present

## 2023-01-15 DIAGNOSIS — Z79899 Other long term (current) drug therapy: Secondary | ICD-10-CM

## 2023-01-15 DIAGNOSIS — I361 Nonrheumatic tricuspid (valve) insufficiency: Secondary | ICD-10-CM | POA: Diagnosis not present

## 2023-01-15 DIAGNOSIS — N289 Disorder of kidney and ureter, unspecified: Secondary | ICD-10-CM | POA: Diagnosis not present

## 2023-01-15 DIAGNOSIS — Z452 Encounter for adjustment and management of vascular access device: Secondary | ICD-10-CM | POA: Diagnosis not present

## 2023-01-15 DIAGNOSIS — Z923 Personal history of irradiation: Secondary | ICD-10-CM

## 2023-01-15 DIAGNOSIS — Z7982 Long term (current) use of aspirin: Secondary | ICD-10-CM

## 2023-01-15 HISTORY — PX: RIGHT HEART CATH: CATH118263

## 2023-01-15 LAB — BASIC METABOLIC PANEL
Anion gap: 15 (ref 5–15)
BUN: 74 mg/dL — ABNORMAL HIGH (ref 6–20)
CO2: 26 mmol/L (ref 22–32)
Calcium: 8.1 mg/dL — ABNORMAL LOW (ref 8.9–10.3)
Chloride: 90 mmol/L — ABNORMAL LOW (ref 98–111)
Creatinine, Ser: 2.9 mg/dL — ABNORMAL HIGH (ref 0.61–1.24)
GFR, Estimated: 26 mL/min — ABNORMAL LOW (ref >60)
Glucose, Bld: 106 mg/dL — ABNORMAL HIGH (ref 70–99)
Potassium: 3.6 mmol/L (ref 3.5–5.1)
Sodium: 131 mmol/L — ABNORMAL LOW (ref 135–145)

## 2023-01-15 LAB — CBC
HCT: 38.8 % — ABNORMAL LOW (ref 39.0–52.0)
Hemoglobin: 12.9 g/dL — ABNORMAL LOW (ref 13.0–17.0)
MCH: 30.6 pg (ref 26.0–34.0)
MCHC: 33.2 g/dL (ref 30.0–36.0)
MCV: 91.9 fL (ref 80.0–100.0)
Platelets: 221 10*3/uL (ref 150–400)
RBC: 4.22 MIL/uL (ref 4.22–5.81)
RDW: 15.9 % — ABNORMAL HIGH (ref 11.5–15.5)
WBC: 3.4 10*3/uL — ABNORMAL LOW (ref 4.0–10.5)
nRBC: 0 % (ref 0.0–0.2)

## 2023-01-15 LAB — COOXEMETRY PANEL
Carboxyhemoglobin: 1.3 % (ref 0.5–1.5)
Carboxyhemoglobin: 1.6 % — ABNORMAL HIGH (ref 0.5–1.5)
Carboxyhemoglobin: 1.8 % — ABNORMAL HIGH (ref 0.5–1.5)
Methemoglobin: <0.7 % (ref 0.0–1.5)
Methemoglobin: <0.7 % (ref 0.0–1.5)
Methemoglobin: <0.7 % (ref 0.0–1.5)
O2 Saturation: 48.1 %
O2 Saturation: 51.7 %
O2 Saturation: 62 %
Total hemoglobin: 12.6 g/dL (ref 12.0–16.0)
Total hemoglobin: 12.7 g/dL (ref 12.0–16.0)
Total hemoglobin: 11.9 g/dL — ABNORMAL LOW (ref 12.0–16.0)

## 2023-01-15 LAB — POCT I-STAT EG7
Acid-Base Excess: 0 mmol/L (ref 0.0–2.0)
Acid-base deficit: 1 mmol/L (ref 0.0–2.0)
Bicarbonate: 23.9 mmol/L (ref 20.0–28.0)
Bicarbonate: 25.4 mmol/L (ref 20.0–28.0)
Calcium, Ion: 0.89 mmol/L — CL (ref 1.15–1.40)
Calcium, Ion: 1 mmol/L — ABNORMAL LOW (ref 1.15–1.40)
HCT: 34 % — ABNORMAL LOW (ref 39.0–52.0)
HCT: 36 % — ABNORMAL LOW (ref 39.0–52.0)
Hemoglobin: 11.6 g/dL — ABNORMAL LOW (ref 13.0–17.0)
Hemoglobin: 12.2 g/dL — ABNORMAL LOW (ref 13.0–17.0)
O2 Saturation: 60 %
O2 Saturation: 63 %
Potassium: 3.5 mmol/L (ref 3.5–5.1)
Potassium: 3.8 mmol/L (ref 3.5–5.1)
Sodium: 133 mmol/L — ABNORMAL LOW (ref 135–145)
Sodium: 136 mmol/L (ref 135–145)
TCO2: 25 mmol/L (ref 22–32)
TCO2: 27 mmol/L (ref 22–32)
pCO2, Ven: 39.6 mmHg — ABNORMAL LOW (ref 44–60)
pCO2, Ven: 41.9 mmHg — ABNORMAL LOW (ref 44–60)
pH, Ven: 7.388 (ref 7.25–7.43)
pH, Ven: 7.39 (ref 7.25–7.43)
pO2, Ven: 32 mmHg (ref 32–45)
pO2, Ven: 33 mmHg (ref 32–45)

## 2023-01-15 LAB — MRSA NEXT GEN BY PCR, NASAL: MRSA by PCR Next Gen: NOT DETECTED

## 2023-01-15 LAB — CG4 I-STAT (LACTIC ACID): Lactic Acid, Venous: 0.6 mmol/L (ref 0.5–1.9)

## 2023-01-15 SURGERY — RIGHT HEART CATH
Anesthesia: LOCAL

## 2023-01-15 MED ORDER — SODIUM CHLORIDE 0.9% FLUSH: 3.0000 mL | INTRAVENOUS | Status: DC | PRN

## 2023-01-15 MED ORDER — ORAL CARE MOUTH RINSE: 15.0000 mL | OROMUCOSAL | Status: DC | PRN

## 2023-01-15 MED ORDER — POTASSIUM CHLORIDE CRYS ER 20 MEQ PO TBCR
40.0000 meq | EXTENDED_RELEASE_TABLET | Freq: Once | ORAL | Status: AC
  Administered 2023-01-15: 40 meq via ORAL
  Filled 2023-01-15: qty 2

## 2023-01-15 MED ORDER — SODIUM CHLORIDE 0.9 % IV SOLN: INTRAVENOUS | Status: DC

## 2023-01-15 MED ORDER — SODIUM CHLORIDE 0.9 % IV SOLN
250.0000 mL | INTRAVENOUS | Status: DC | PRN
  Administered 2023-01-15: 250 mL via INTRAVENOUS

## 2023-01-15 MED ORDER — CEFAZOLIN SODIUM-DEXTROSE 1-4 GM/50ML-% IV SOLN
1.0000 g | Freq: Once | INTRAVENOUS | Status: AC
  Administered 2023-01-15: 1 g via INTRAVENOUS
  Filled 2023-01-15: qty 50

## 2023-01-15 MED ORDER — ACETAMINOPHEN 325 MG PO TABS
650.0000 mg | ORAL_TABLET | ORAL | Status: DC | PRN
  Administered 2023-01-15 – 2023-01-16 (×3): 650 mg via ORAL
  Filled 2023-01-15 (×3): qty 2

## 2023-01-15 MED ORDER — MILRINONE LACTATE IN DEXTROSE 20-5 MG/100ML-% IV SOLN
0.2500 ug/kg/min | INTRAVENOUS | Status: DC
  Administered 2023-01-15: 0.125 ug/kg/min via INTRAVENOUS
  Administered 2023-01-16 – 2023-01-17 (×2): 0.25 ug/kg/min via INTRAVENOUS
  Filled 2023-01-15 (×4): qty 100

## 2023-01-15 MED ORDER — ONDANSETRON HCL 4 MG/2ML IJ SOLN: 4.0000 mg | Freq: Four times a day (QID) | INTRAMUSCULAR | Status: DC | PRN

## 2023-01-15 MED ORDER — CHLORHEXIDINE GLUCONATE CLOTH 2 % EX PADS
6.0000 | MEDICATED_PAD | Freq: Every day | CUTANEOUS | Status: DC
  Administered 2023-01-15 – 2023-01-17 (×3): 6 via TOPICAL

## 2023-01-15 MED ORDER — OXYCODONE HCL 5 MG PO TABS: 5.0000 mg | ORAL_TABLET | Freq: Three times a day (TID) | ORAL | Status: DC | PRN

## 2023-01-15 MED ORDER — ACETAMINOPHEN 325 MG PO TABS: 650.0000 mg | ORAL_TABLET | ORAL | Status: DC | PRN

## 2023-01-15 MED ORDER — MIDAZOLAM HCL 2 MG/2ML IJ SOLN
INTRAMUSCULAR | Status: AC
  Filled 2023-01-15: qty 2

## 2023-01-15 MED ORDER — SODIUM CHLORIDE 0.9 % IV SOLN: 250.0000 mL | INTRAVENOUS | Status: DC | PRN

## 2023-01-15 MED ORDER — ENOXAPARIN SODIUM 30 MG/0.3ML IJ SOSY
30.0000 mg | PREFILLED_SYRINGE | INTRAMUSCULAR | Status: DC
  Administered 2023-01-16 – 2023-01-17 (×2): 30 mg via SUBCUTANEOUS
  Filled 2023-01-15 (×2): qty 0.3

## 2023-01-15 MED ORDER — SODIUM CHLORIDE 0.9% FLUSH
3.0000 mL | Freq: Two times a day (BID) | INTRAVENOUS | Status: DC
  Administered 2023-01-15 – 2023-01-17 (×5): 3 mL via INTRAVENOUS

## 2023-01-15 MED ORDER — LIDOCAINE HCL (PF) 1 % IJ SOLN
INTRAMUSCULAR | Status: AC
  Filled 2023-01-15: qty 30

## 2023-01-15 MED ORDER — NOREPINEPHRINE 4 MG/250ML-% IV SOLN
0.0000 ug/min | INTRAVENOUS | Status: DC
  Administered 2023-01-15: 2 ug/min via INTRAVENOUS
  Filled 2023-01-15 (×2): qty 250

## 2023-01-15 MED ORDER — ENOXAPARIN SODIUM 30 MG/0.3ML IJ SOSY: 30.0000 mg | PREFILLED_SYRINGE | INTRAMUSCULAR | Status: DC

## 2023-01-15 MED ORDER — ATORVASTATIN CALCIUM 10 MG PO TABS
20.0000 mg | ORAL_TABLET | Freq: Every day | ORAL | Status: DC
  Administered 2023-01-15 – 2023-01-17 (×3): 20 mg via ORAL
  Filled 2023-01-15 (×3): qty 2

## 2023-01-15 MED ORDER — LIDOCAINE HCL (PF) 1 % IJ SOLN
INTRAMUSCULAR | Status: DC | PRN
  Administered 2023-01-15: 10 mL

## 2023-01-15 MED ORDER — MIDAZOLAM HCL 2 MG/2ML IJ SOLN
INTRAMUSCULAR | Status: DC | PRN
  Administered 2023-01-15: 1 mg via INTRAVENOUS

## 2023-01-15 MED ORDER — HYDRALAZINE HCL 20 MG/ML IJ SOLN: 10.0000 mg | INTRAMUSCULAR | Status: AC | PRN

## 2023-01-15 MED ORDER — ASPIRIN 81 MG PO TBEC
81.0000 mg | DELAYED_RELEASE_TABLET | Freq: Every day | ORAL | Status: DC
  Administered 2023-01-15 – 2023-01-17 (×3): 81 mg via ORAL
  Filled 2023-01-15 (×3): qty 1

## 2023-01-15 SURGICAL SUPPLY — 11 items
CATH BALLN WEDGE 5F 110CM (CATHETERS) IMPLANT
CATH SWAN GANZ 7F STRAIGHT (CATHETERS) IMPLANT
CATH-GARD ARROW CATH SHIELD (MISCELLANEOUS) ×2
KIT MICROPUNCTURE NIT STIFF (SHEATH) IMPLANT
PACK CARDIAC CATHETERIZATION (CUSTOM PROCEDURE TRAY) ×1 IMPLANT
SHEATH GLIDE SLENDER 4/5FR (SHEATH) IMPLANT
SHEATH PINNACLE 7F 10CM (SHEATH) IMPLANT
SHEATH PROBE COVER 6X72 (BAG) IMPLANT
SHIELD CATHGARD ARROW (MISCELLANEOUS) IMPLANT
TRANSDUCER W/STOPCOCK (MISCELLANEOUS) ×1 IMPLANT
TUBING ART PRESS 72 MALE/FEM (TUBING) IMPLANT

## 2023-01-15 NOTE — Interval H&P Note (Signed)
History and Physical Interval Note:  01/15/2023 7:42 AM  [name]  has presented today for surgery, with the diagnosis of Heart Failure.  The various methods of treatment have been discussed with the patient and family. After consideration of risks, benefits and other options for treatment, the patient has consented to  Procedure(s): RIGHT HEART CATH (N/A) as a surgical intervention.  The patient's history has been reviewed, patient examined, no change in status, stable for surgery.  I have reviewed the patient's chart and labs.  Questions were answered to the patient's satisfaction.     Yuki Purves

## 2023-01-15 NOTE — H&P (Signed)
Advanced Heart Failure TEam H&P    PCP: Donita Brooks, MD Primary Cardiologist: Dr Diona Browner HF Cardiologist: Dr Gala Romney [name]: Dr Edwena Blow  HPI: [name] is a 52 y.o. male with history of NICM (diagnosed 10/2010 - norm cors 2012 and 9/18), chronic systolic heart failure EF 15% (08/2013), S/P ICD [employer], smoker, Wilms tumor s/p nephrectomy at age 83 had chemoradiation, scoliosis.    CPX (5/15) with RER 1.06, VO2 max 18.5, VE/VCO2 slope 32.1.  Moderate to severe functional limitation, circulatory.  CPX 09/17/16 Peak VO2: 20.5 (53% predicted peak VO2) Slope 35 Underwent L/RHC on 02/12/17. Had normal coronaries and relatively well-compensated hemodynamics. EF 20%. Echo 6/20 EF 15% RV mild HK with severe TR Personally reviewed CPX 8/20 with pVO2 17.1 (down from 20.5)  Slope 35 (stable).   CT abdomen 10/20 c/w cirrhosis. Paracentesis 03/02/19: 3.7 L off. No need for repeat paracenteses Echo 12/17/20: LVEF < 20% RV normal severe TR  He was seen in the HF clinic 4/23. CPX repeat and referred to [facility]   On 03/27/22 had VT with successful ATP. EP reviewed and therapies were appropriate. He was unaware of the event. Called by EP and given [place] DMV no driving for 6 months.   Echo 05/22/22: EF <15%, mild RV systolic dysfunction mild to mod MR, mod TR, RVSP 51 mmHg  RHC 05/24/22: RA 7 PA 47/24 (32) W 24 CI 2.5 PVR 1.8 hepatic wedge 10    Seen by GI for cirrhosis. CT scan in 2/24 which showed stable appearing cirrhotic liver with no mass lesions. Found to have benign left adrenal adenoma. Urine studies ruled out pheochromocytoma.   Colonoscopy 10/31/22 congested mucosa due to portal HTN. + hemorrhoids. No mass.  Underwent CPX testing 12/31/22; BP rest: 88/56 Standing BP: 84/56 BP peak: 78/56  Peak VO2: 15.2 (42% predicted peak VO2)  VE/VCO2 slope:  34  Peak RER: 1.11   Seen yesterday at [facility] for Transplant eval. Was feeling ok but SCr rose 1.8 -> 2.9 so brought in today for  RHC.  RA = 10 RV = 36/11 PA = 39/15 (26) PCW = 17 Fick cardiac output/index = 4.1/2.2 Thermo CO/CI = 3.2/1.7 PVR = 2.2 (Fick) 2.8 (TD) Ao sat = 96% PA sat = 60%, 63% PAPi = 2.4  Stated that a few days ago he was out working and lost a lot of fluid and felt weak and dehydrated. AKI felt due to low output and ATN.   Being admitted for milrinone support and hemodynamic optimization.   SH: Lives alone. Unemployed. On disability. Does not drink alcohol.  FH: Grandfather MI  ROS: All systems negative except as listed in HPI, PMH and Problem List.  Past Medical History:  Diagnosis Date   AICD (automatic cardioverter/defibrillator) present    Arthritis    Cardiomyopathy, nonischemic (HCC)    takes Digoxin daily and Carvedilol daily   CHF (congestive heart failure) (HCC)    takes Lasix daily as well Aldactone   Chronic back pain    Chronic systolic heart failure (HCC)    Cirrhosis (HCC)    Depression    takes Prozac daily   GERD (gastroesophageal reflux disease)    takes Omeprazole daily   Hx of Alcohol consumption heavy    rare beer currently   hx of Tobacco abuse    quit smoking 2014   Hyperlipidemia    was on Simvastatin but has been off a year   Insomnia    takes Ambien  as needed(just got script yesterday)   Orthostatic hypotension    Scoliosis    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 59 (XRT and chemo)   Current Facility-Administered Medications  Medication Dose Route Frequency Provider Last Rate Last Admin   0.9 %  sodium chloride infusion   Intravenous Continuous Lyman Balingit, Bevelyn Buckles, MD 10 mL/hr at 01/15/23 0603 New Bag at 01/15/23 0603   lidocaine (PF) (XYLOCAINE) 1 % injection    PRN Romesha Scherer, Bevelyn Buckles, MD   10 mL at 01/15/23 0747   midazolam (VERSED) injection    PRN Angelynn Lemus, Bevelyn Buckles, MD   1 mg at 01/15/23 0742   BP (!) 78/60 Comment: left arm  Pulse 66   Temp 98.8 F (37.1 C) (Temporal)   Resp 16   Ht 5\' 8"  (1.727 m)   Wt 70.8 kg   SpO2 98%   BMI 23.72  kg/m   Wt Readings from Last 3 Encounters:  01/15/23 70.8 kg  01/10/23 72.7 kg  12/19/22 69 kg   PHYSICAL EXAM: General:  Lying flat . No resp difficulty HEENT: normal Neck: supple. JVP 10  Carotids 2+ bilat; no bruits. No lymphadenopathy or thryomegaly appreciated. Cor: PMI laterally displaced. Regular rate & rhythm. 2/6 TR Lungs: clear Abdomen: soft, nontender, mildly distended. No hepatosplenomegaly. No bruits or masses. Good bowel sounds. Extremities: no cyanosis, clubbing, rash, edema Neuro: alert & orientedx3, cranial nerves grossly intact. moves all 4 extremities w/o difficulty. Affect pleasant   Device interrogation: pending    ASSESSMENT & PLAN:  1. AKI  - has h/o solitary kidney in setting of previous Wilms tumor - Baseline Scr 1.8 - SCr now 2.9 - Suspect combination of cardiorenal (low output) and ATN from recent dehydration - Hold diuretics, ARNI, spiro and SGLT2i - Support with milrinone - Renal u/s  2. Acute on chronic Systolic HF:   - Nonischemic cardiomyopathy - Echo 10/15 EF 10%.  - Echo 2/18 EF 15% RV mild HK  [employer] ICD.   - Echo 6/20 EF 15% RV normal severe TR - Cath 9/18: normal cors. Relatively well-compensated hemodynmaics - W/u has revealed evidence of cirrhosis which I suspect is due to a combination of ETOH and RV failure/severe TR. Synthetic function currently seems ok  - Doubt TV repair will be sufficient for him so question will be if he will still be a transplant candidate. GI has seen and feels liver disease likely not severe enough to be a barrier to transplant.  May need [facility] team to weigh in as well. - CPX 7/23 pVO2 14.5 (40%) slope 34  Hypotensive BP response  - CPX  12/31/22; pVO2 15.2 (42%) slope 35% Flat BP response  - Echo (12/23 at [facility]): EF <15%, mild RV systolic dysfunction mild to mod MR, mod TR, RVSP 51 mmHg - Now with NYHA IV symptoms and AKI - RHC today 01/15/23: RA 10 PA 39/15 (26) PCW 17 Fick 4.1/2.2 TD 3.2/1.7  PVR 2.2  - As above. Hold diuretics and GDMT. Support with milrinone - D/w [facility] will plan to discuss at listing meeting this week. If scr recovers OHTx only versus OHTx + kidney.   3. Severe TR - Echo  severe TR but TV seems structurally intact. ? If related to ICD wire or just dilated annulus.  - Doubt TV repair will be sufficient for him - No change  4. Cirrhosis - Due to RV failure +/- ETOH - w/u as above - Appreciate GI input. May need  heart/liver tx  5. VT - 03/27/22 had successful ATP. Appropriate therapy for VT.   - Keep K> 4.0 Mg > 2.0   Arvilla Meres, MD  8:33 AM

## 2023-01-15 NOTE — TOC Initial Note (Addendum)
Transition of Care [facility]) - Initial/Assessment Note    Patient Details  Name: [name] MRN: [id number] Date of Birth: December 10, 1970  Transition of Care [facility]) CM/SW Contact:    Elliot Cousin, RN Phone Number: [phone] 01/15/2023, 12:16 PM  Clinical Narrative:     CM spoke to pt at bedside. Pt reports he lives alone and was independent PTA. States his son visit him on weekend. Will continue to follow for dc needs.               Expected Discharge Plan: Acute to Acute Transfer Barriers to Discharge: Continued Medical Work up   Patient Goals and CMS Choice Patient states their goals for this hospitalization and ongoing recovery are:: wants to get better          Expected Discharge Plan and Services   Discharge Planning Services: CM Consult   Living arrangements for the past 2 months: Single Family Home                                      Prior Living Arrangements/Services Living arrangements for the past 2 months: Single Family Home Lives with:: Self Patient language and need for interpreter reviewed:: Yes        Need for Family Participation in Patient Care: No (Comment) Care giver support system in place?: No (comment)   Criminal Activity/Legal Involvement Pertinent to Current Situation/Hospitalization: No - Comment as needed  Activities of Daily Living Home Assistive Devices/Equipment: None ADL Screening (condition at time of admission) Patient's cognitive ability adequate to safely complete daily activities?: Yes Is the patient deaf or have difficulty hearing?: No Does the patient have difficulty seeing, even when wearing glasses/contacts?: No Does the patient have difficulty concentrating, remembering, or making decisions?: No Patient able to express need for assistance with ADLs?: No Does the patient have difficulty dressing or bathing?: No Independently performs ADLs?: Yes (appropriate for developmental age) Does the patient have difficulty  walking or climbing stairs?: No Weakness of Legs: None Weakness of Arms/Hands: None  Permission Sought/Granted Permission sought to share information with : Family Supports, Case Manager Permission granted to share information with : Yes, Verbal Permission Granted  Share Information with NAME: Jabes Brotherson     Permission granted to share info w Relationship: father  Permission granted to share info w Contact Information: [phone]  Emotional Assessment Appearance:: Appears stated age Attitude/Demeanor/Rapport: Engaged Affect (typically observed): Accepting Orientation: : Oriented to Self, Oriented to Place, Oriented to  Time, Oriented to Situation   Psych Involvement: No (comment)  Admission diagnosis:  Acute on chronic systolic (congestive) heart failure (HCC) [I50.23] Patient Active Problem List   Diagnosis Date Noted   Acute on chronic systolic (congestive) heart failure (HCC) 01/15/2023   Positive colorectal cancer screening using Cologuard test 10/31/2022   Benign neoplasm of colon 10/31/2022   Mucosal abnormality of colon 10/31/2022   Adrenal mass 1 cm to 4 cm in diameter (HCC) 08/21/2022   Heart transplant candidate 05/23/2022   Pulmonary nodule 05/22/2022   Acute on chronic systolic congestive heart failure (HCC)    Acute on chronic combined systolic and diastolic CHF (congestive heart failure) (HCC) 09/03/2021   GERD (gastroesophageal reflux disease) 09/03/2021   Cough 09/03/2021   Hyponatremia 09/03/2021   Hypokalemia 09/03/2021   DM (diabetes mellitus) (HCC) 08/12/2019   Tobacco abuse    Alcohol abuse 10/19/2015   Cirrhosis  of liver (HCC) 09/21/2015   Erectile dysfunction 03/18/2015   Chronic pain syndrome 09/15/2014   Insomnia 09/07/2013   Chronic CHF (congestive heart failure) (HCC) 08/25/2013   Paresthesia of hand 11/12/2012   Hyperlipidemia 08/12/2012   Scoliosis 01/01/2012   H/O compression fracture of spine 01/01/2012   Back pain 10/22/2011   Neck  pain 10/22/2011   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/30/2011   Depression 07/25/2011   Laboratory test 06/20/2011   Wilm's tumor    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 [place] [address] Phone: [phone] Fax: [phone]     Social Determinants of Health (SDOH) Social History: SDOH Screenings   Food Insecurity: No Food Insecurity (01/15/2023)  Housing: Low Risk  (01/15/2023)  Transportation Needs: No Transportation Needs (01/15/2023)  Utilities: Not At Risk (01/15/2023)  Alcohol Screen: Low Risk  (07/13/2022)  Depression (PHQ2-9): Low Risk  (07/13/2022)  Financial Resource Strain: Low Risk  (07/13/2022)  Physical Activity: Inactive (07/13/2022)  Social Connections: Moderately Isolated (07/13/2022)  Stress: No Stress Concern Present (07/13/2022)  Tobacco Use: High Risk (01/15/2023)   SDOH Interventions:     Readmission Risk Interventions     No data to display

## 2023-01-15 NOTE — Progress Notes (Signed)
   Admitted from cath lab. Initial CO-OX 48%. Lactic acid 0.6. Started on milrinone 0.125 mcg.   CO-OX repeated--> 52%  Increase milrinone to 0.25 mcg. Check CO-OX 1500  Urinary retention noted on bladder scan. After 500 cc urine output , bladder scan with > 600 cc of urine. Place foley catheter.   Kol Consuegra NP-C  1:32 PM

## 2023-01-16 ENCOUNTER — Encounter (HOSPITAL_COMMUNITY): Payer: Self-pay | Admitting: Internal Medicine

## 2023-01-16 ENCOUNTER — Inpatient Hospital Stay (HOSPITAL_COMMUNITY): Payer: Medicare HMO

## 2023-01-16 ENCOUNTER — Other Ambulatory Visit (HOSPITAL_COMMUNITY): Payer: Self-pay

## 2023-01-16 DIAGNOSIS — I5023 Acute on chronic systolic (congestive) heart failure: Secondary | ICD-10-CM | POA: Diagnosis not present

## 2023-01-16 HISTORY — PX: IR US GUIDE VASC ACCESS RIGHT: IMG2390

## 2023-01-16 HISTORY — PX: IR FLUORO GUIDE CV LINE RIGHT: IMG2283

## 2023-01-16 LAB — BASIC METABOLIC PANEL
Anion gap: 12 (ref 5–15)
BUN: 47 mg/dL — ABNORMAL HIGH (ref 6–20)
CO2: 25 mmol/L (ref 22–32)
Calcium: 8.2 mg/dL — ABNORMAL LOW (ref 8.9–10.3)
Chloride: 94 mmol/L — ABNORMAL LOW (ref 98–111)
Creatinine, Ser: 1.63 mg/dL — ABNORMAL HIGH (ref 0.61–1.24)
GFR, Estimated: 51 mL/min — ABNORMAL LOW (ref >60)
Glucose, Bld: 200 mg/dL — ABNORMAL HIGH (ref 70–99)
Potassium: 3.9 mmol/L (ref 3.5–5.1)
Sodium: 131 mmol/L — ABNORMAL LOW (ref 135–145)

## 2023-01-16 LAB — MAGNESIUM: Magnesium: 1.9 mg/dL (ref 1.7–2.4)

## 2023-01-16 MED ORDER — MEXILETINE HCL 200 MG PO CAPS
200.0000 mg | ORAL_CAPSULE | Freq: Two times a day (BID) | ORAL | Status: DC
  Administered 2023-01-16 – 2023-01-17 (×3): 200 mg via ORAL
  Filled 2023-01-16 (×4): qty 1

## 2023-01-16 MED ORDER — MEXILETINE HCL 200 MG PO CAPS
200.0000 mg | ORAL_CAPSULE | Freq: Two times a day (BID) | ORAL | 3 refills | Status: AC
Start: 2023-01-16 — End: ?
  Filled 2023-01-16: qty 60, 30d supply, fill #0

## 2023-01-16 MED ORDER — MAGNESIUM SULFATE 2 GM/50ML IV SOLN
2.0000 g | Freq: Once | INTRAVENOUS | Status: AC
  Administered 2023-01-16: 2 g via INTRAVENOUS
  Filled 2023-01-16: qty 50

## 2023-01-16 MED ORDER — POTASSIUM CHLORIDE CRYS ER 20 MEQ PO TBCR
40.0000 meq | EXTENDED_RELEASE_TABLET | Freq: Once | ORAL | Status: AC
  Administered 2023-01-16: 40 meq via ORAL
  Filled 2023-01-16: qty 2

## 2023-01-16 MED ORDER — FLUTICASONE PROPIONATE 50 MCG/ACT NA SUSP
1.0000 | Freq: Once | NASAL | Status: AC
  Administered 2023-01-16: 1 via NASAL
  Filled 2023-01-16: qty 16

## 2023-01-16 MED ORDER — TAMSULOSIN HCL 0.4 MG PO CAPS
0.4000 mg | ORAL_CAPSULE | Freq: Every day | ORAL | Status: DC
  Administered 2023-01-16: 0.4 mg via ORAL
  Filled 2023-01-16: qty 1

## 2023-01-16 MED ORDER — LIDOCAINE-EPINEPHRINE 1 %-1:100000 IJ SOLN
INTRAMUSCULAR | Status: AC
  Filled 2023-01-16: qty 1

## 2023-01-16 NOTE — Procedures (Signed)
Interventional Radiology Procedure Note  Procedure: Tunneled central venous catheter placement   Findings: Please refer to procedural dictation for full description. 6 Fr, 23 cm tunneled dual lumen central venous cathter, tip at cavoatrial junction.  Complications: None immediate  Estimated Blood Loss: < 5 mL  Recommendations: Catheter ready for immediate use.   Marliss Coots, MD

## 2023-01-16 NOTE — Progress Notes (Addendum)
Advanced Heart Failure Rounding Note  PCP-Cardiologist: Arvilla Meres, MD   Subjective:   Admitted after cath  01/15/23: RA 10 PA 39/15 (26) PCW 17 Fick 4.1/2.2 TD 3.2/1.7 PVR 2.2 . CO-OX 48%. Started on milrinone 0.125. BP soft. Norepi added and milrinone increased to 0.25 mcg.     Currently on Norepi 2 mcg + Milrinone 0.25 mcg. CO-OX 72%   Swan #s CVP 5 PA 43/16  SVR 1874 CO 3.4 CI 1.8    Denies SOB.    Objective:   Weight Range: 73 kg Body mass index is 24.47 kg/m.   Vital Signs:   Temp:  [97.2 F (36.2 C)-98.2 F (36.8 C)] 97.3 F (36.3 C) (08/07 0600) Pulse Rate:  [58-96] 70 (08/07 0600) Resp:  [11-25] 15 (08/07 0600) BP: (74-116)/(46-81) 93/61 (08/07 0600) SpO2:  [91 %-99 %] 99 % (08/07 0600) Weight:  [73 kg] 73 kg (08/07 0500) Last BM Date : 01/14/23  Weight change: Filed Weights   01/15/23 0605 01/16/23 0500  Weight: 70.8 kg 73 kg    Intake/Output:   Intake/Output Summary (Last 24 hours) at 01/16/2023 0803 Last data filed at 01/16/2023 0500 Gross per 24 hour  Intake 446.24 ml  Output 3814 ml  Net -3367.76 ml      Physical Exam    General:  Well appearing. No resp difficulty HEENT: Normal Neck: Supple. JVP 5-6  . Carotids 2+ bilat; no bruits. No lymphadenopathy or thyromegaly appreciated. RIJ  Cor: PMI nondisplaced. Regular rate & rhythm. No rubs, gallops or murmurs. Lungs: Clear Abdomen: Soft, nontender, nondistended. No hepatosplenomegaly. No bruits or masses. Good bowel sounds. Extremities: No cyanosis, clubbing, rash, edema Neuro: Alert & orientedx3, cranial nerves grossly intact. moves all 4 extremities w/o difficulty. Affect pleasant GU: Foley    Telemetry   SR with frequent PVCs 30-35 per hour (45%)   EKG    N/A  Labs    CBC Recent Labs    01/15/23 0554 01/15/23 0755 01/15/23 0756  WBC 3.4*  --   --   HGB 12.9* 12.2* 11.6*  HCT 38.8* 36.0* 34.0*  MCV 91.9  --   --   PLT 221  --   --    Basic Metabolic  Panel Recent Labs    01/15/23 0554 01/15/23 0755 01/15/23 0756  NA 131* 133* 136  K 3.6 3.8 3.5  CL 90*  --   --   CO2 26  --   --   GLUCOSE 106*  --   --   BUN 74*  --   --   CREATININE 2.90*  --   --   CALCIUM 8.1*  --   --    Liver Function Tests No results for input(s): "AST", "ALT", "ALKPHOS", "BILITOT", "PROT", "ALBUMIN" in the last 72 hours. No results for input(s): "LIPASE", "AMYLASE" in the last 72 hours. Cardiac Enzymes No results for input(s): "CKTOTAL", "CKMB", "CKMBINDEX", "TROPONINI" in the last 72 hours.  BNP: BNP (last 3 results) Recent Labs    03/29/22 1553 04/30/22 0932 12/19/22 1006  BNP 2,251.7* 3,033.1* 1,554.2*    ProBNP (last 3 results) No results for input(s): "PROBNP" in the last 8760 hours.   D-Dimer No results for input(s): "DDIMER" in the last 72 hours. Hemoglobin A1C No results for input(s): "HGBA1C" in the last 72 hours. Fasting Lipid Panel No results for input(s): "CHOL", "HDL", "LDLCALC", "TRIG", "CHOLHDL", "LDLDIRECT" in the last 72 hours. Thyroid Function Tests No results for input(s): "TSH", "T4TOTAL", "T3FREE", "THYROIDAB"  in the last 72 hours.  Invalid input(s): "FREET3"  Other results:   Imaging    US RENAL  Result Date: 01/15/2023 CLINICAL DATA:  Acute kidney insufficiency. EXAM: RENAL / URINARY TRACT ULTRASOUND COMPLETE COMPARISON:  Abdominal CT 08/03/2022. FINDINGS: Right Kidney: Renal measurements: 10.2 x 8.0 x 6.1 cm = volume: 258.6 mL. Echogenicity within normal limits. No mass or hydronephrosis visualized. Left Kidney: Surgically absent as per clinical history. No abnormal soft tissue or fluid in the left renal bed. Please correlate with the prior CT scan however. Bladder: Foley catheter in the urinary bladder. Other: Some limitation with overlapping bowel gas and soft tissue. IMPRESSION: Left kidney surgically absent as per history. No right-sided collecting system dilatation. Foley catheter in the mildly distended  urinary bladder. Electronically Signed   By: Karen Kays M.D.   On: 01/15/2023 17:33     Medications:     Scheduled Medications:  aspirin EC  81 mg Oral Daily   atorvastatin  20 mg Oral Daily   Chlorhexidine Gluconate Cloth  6 each Topical Daily   enoxaparin (LOVENOX) injection  30 mg Subcutaneous Q24H   sodium chloride flush  3 mL Intravenous Q12H   sodium chloride flush  3 mL Intravenous Q12H    Infusions:  sodium chloride     sodium chloride Stopped (01/15/23 1733)   milrinone 0.25 mcg/kg/min (01/16/23 0500)   norepinephrine (LEVOPHED) Adult infusion 4 mcg/min (01/16/23 0500)    PRN Medications: sodium chloride, sodium chloride, acetaminophen, ondansetron (ZOFRAN) IV, mouth rinse, oxyCODONE, sodium chloride flush, sodium chloride flush    Patient Profile   ony is a 52 y.o. 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    Admitted with A/C HFrEF.    Assessment/Plan  1. A/C HFrEF  Nonischemic cardiomyopathy - Echo 10/15 EF 10%.  - Echo 2/18 EF 15% RV mild HK  [employer] ICD.   - Echo 6/20 EF 15% RV normal severe TR - Cath 9/18: normal cors. Relatively well-compensated hemodynmaics - W/u has revealed evidence of cirrhosis which I suspect is due to a combination of ETOH and RV failure/severe TR. Synthetic function currently seems ok  - Doubt TV repair will be sufficient for him so question will be if he will still be a transplant candidate. GI has seen and feels liver disease likely not severe enough to be a barrier to transplant.  May need [facility] team to weigh in as well. - CPX 7/23 pVO2 14.5 (40%) slope 34  Hypotensive BP response  - CPX  12/31/22; pVO2 15.2 (42%) slope 35% Flat BP response  - Echo (12/23 at [facility]): EF <15%, mild RV systolic dysfunction mild to mod MR, mod TR, RVSP 51  mmHg - Now with NYHA IV symptoms and AKI - RHC 01/15/23: RA 10 PA 39/15 (26) PCW 17 Fick 4.1/2.2 TD 3.2/1.7 PVR 2.2 . Started on milrinone 0.125 mcg.  -Milrinone increased to 0.25 mcg and added Norepi 2 mcg. CO-OX now stable 72%.  Ernestine Conrad #s RA 5-6 PCWP 16 CO 3.37 CI 1.8  - Labs pending.   2. AKI has h/o solitary kidney in setting of previous Wilms tumor - Baseline Scr 1.8 - SCr on admit 2.9--> labs pending. Also had urinary retention. Now with foley.  - Suspect combination of cardiorenal (low output) and ATN from recent dehydration -  CVP low.  Hold diuretics, ARNI, spiro and SGLT2i - Support with milrinone - Renal u/s  3. Severe TR - Echo  severe TR but TV seems structurally intact. ? If related to ICD wire or just dilated annulus.  - Doubt TV repair will be sufficient for him - No change   4. 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 May need heart/liver tx   5. VT/PVC - 03/27/22 had successful ATP. Appropriate therapy for VT.   - Keep K> 4.0 Mg > 2. - High PVC burden 45%.  - Try to avoid amio with RV failure.  - Add mexiletine 200 mg bid     Length of Stay: 1  Amy Clegg, NP  01/16/2023, 8:03 AM  Advanced Heart Failure Team Pager [phone] (M-F; 7a - 5p)  Please contact [facility] for night-coverage after hours (5p -7a ) and weekends on [email or web address]  Agree with above.  Remains on milrinone and NE. Hemodynamics improved. But still marginal.   Labs pending. Renal u/s with stable kidney.   Had urinary retention and Foley placed.  Having frequent PVCs.   General:  Sitting up in bed  No resp difficulty HEENT: normal Neck: supple. RIJ swan Carotids 2+ bilat; no bruits. No lymphadenopathy or thryomegaly appreciated. Cor: Regular rate & rhythm. 2/6 TR Lungs: clear Abdomen: soft, nontender, nondistended. No hepatosplenomegaly. No bruits or masses. Good bowel sounds. Extremities: no cyanosis, clubbing, rash, edema + Foley  Neuro: alert & orientedx3,  cranial nerves grossly intact. moves all 4 extremities w/o difficulty. Affect pleasant  Remains tenuous. Continue current support for now. Await BMET. Start mexilitene for PVCs. Need to get Foley out. Start Flomax.   Will touch base with [facility] today regarding transplant listing meeting tomorrow.   CRITICAL CARE Performed by: Arvilla Meres  Total critical care time: 45 minutes  Critical care time was exclusive of separately billable procedures and treating other patients.  Critical care was necessary to treat or prevent imminent or life-threatening deterioration.  Critical care was time spent personally by me (independent of midlevel providers or residents) on the following activities: development of treatment plan with patient and/or surrogate as well as nursing, discussions with consultants, evaluation of patient's response to treatment, examination of patient, obtaining history from patient or surrogate, ordering and performing treatments and interventions, ordering and review of laboratory studies, ordering and review of radiographic studies, pulse oximetry and re-evaluation of patient's condition.  Arvilla Meres, MD  9:27 AM

## 2023-01-16 NOTE — TOC Benefit Eligibility Note (Signed)
Patient Product/process development scientist completed.    The patient is insured through [employer]. Patient has Medicare and is not eligible for a copay card, but may be able to apply for patient assistance, if available.    Ran test claim for mexiletine 200 mg capsule and Non Formulary   This test claim was processed through [facility]- copay amounts may vary at other pharmacies due to [employer], or as the patient moves through the different stages of their insurance plan.     Roland Earl, CPHT Pharmacy Patient Advocate Specialist [facility] Patient Advocate Team Direct Number: [phone]  Fax: [phone]

## 2023-01-16 NOTE — Discharge Summary (Signed)
Advanced Heart Failure Team  Discharge Summary   Patient ID: [name] MRN: [id number], DOB/AGE: 02-25-1971 52 y.o. Admit date: 01/15/2023 D/C date:     01/17/2023   Primary Discharge Diagnoses:  1. A/C HFrEF 2. AKI 3. Severe TR 4. Cirrhosis  5. VT/PVC 6. Urinary Retention     Hospital Course:   [name] is a 52 year old with a history of NICM (diagnosed 10/2010 - norm cors 2012 and 9/18), chronic systolic heart failure EF 15% (08/2013), S/P ICD [employer], smoker, Wilms tumor s/p nephrectomy at age 30 had chemoradiation, scoliosis.    He has been followed [facility] for transplant evaluation and was seen 8/5 and was feeling ok but serum creatinine rose from 1.8-->2.9. He was set up for [facility].   Admitted after cath with low output heart failure and AKI. Started on milrinone and titrated to 0.25 mcg with improved mixed venous saturation. Serum creatinine improved and was down to 1.4 today.  Tunneled PICC placed for home inotropes.   High PVC burden noted. Mexiletine added to suppress PVCs. Amiodarone avoided with cirrhosis. Of note,  he had urinary retention and had foley placed. Started on flomax with improvement.    Discussed with [facility] Team and he is planning on outpatient follow up for ongoing transplant evaluation.   Home Health arranged with [facility] for home inotropes. See below for detailed problems list. He will contine to be followed closely in the HF clinic.    1. A/C HFrEF  Nonischemic cardiomyopathy - Echo 10/15 EF 10%.  - Echo 2/18 EF 15% RV mild HK  [employer] ICD.   - Echo 6/20 EF 15% RV normal severe TR - Cath 9/18: normal cors. Relatively well-compensated hemodynmaics - W/u has revealed evidence of cirrhosis which I suspect is due to a combination of ETOH and RV failure/severe TR. Synthetic function currently seems ok  - Doubt TV repair will be sufficient for him so question will be if he will still be a transplant candidate. GI has seen and feels liver  disease likely not severe enough to be a barrier to transplant.  May need [facility] team to weigh in as well. - CPX 7/23 pVO2 14.5 (40%) slope 34  Hypotensive BP response  - CPX  12/31/22; pVO2 15.2 (42%) slope 35% Flat BP response  - Echo (12/23 at [facility] EF <15%, mild RV systolic dysfunction mild to mod Richard, mod TR, RVSP 51 mmHg - Now with NYHA IV symptoms and AKI - RHC 01/15/23: RA 10 PA 39/15 (26) PCW 17 Fick 4.1/2.2 TD 3.2/1.7 PVR 2.2 . -Started on milrinone 0.125 mcg and increased to Milrinone 0.25 mcg. CO-OX stable 68%. Tunneled PICC placed.  - Diuretics held on admit. Tomorrow he will restart lower dose of  torsemide 60 mg daily.   - Continue to hold GDMT.  - No bb.    2. AKI has h/o solitary kidney in setting of previous Wilms tumor - Baseline Scr 1.8 - SCr on admit 2.9-->1.37 today.   - Suspect combination of cardiorenal (low output) and ATN from recent dehydration - Hold diuretics, ARNI, spiro and SGLT2i - Support with milrinone - Renal u/s   3. Severe TR - Echo  severe TR but TV seems structurally intact. ? If related to ICD wire or just dilated annulus.  - Doubt TV repair will be sufficient for him - No change   4. 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 May need heart/liver  tx   5. VT/PVC - 03/27/22 had successful ATP. Appropriate therapy for VT.   - Keep K> 4.0 Mg > 2. - High PVC burden 45%. Mexiletine added. PVCs suppressed.  - Try to avoid amio with cirrhosis.    6. Urinary Retention  -Resolved.   Discharge Vitals: Blood pressure (!) 85/59, pulse 73, temperature 98 F (36.7 C), temperature source Oral, resp. rate 14, height 5\' 8"  (1.727 m), weight 71.8 kg, SpO2 95%.  Labs: Lab Results  Component Value Date   WBC 3.4 (L) 01/15/2023   HGB 11.6 (L) 01/15/2023   HCT 34.0 (L) 01/15/2023   MCV 91.9 01/15/2023   PLT 221 01/15/2023    Recent Labs  Lab 01/17/23 0337  NA 133*  K 4.3  CL 99  CO2 26  BUN 36*  CREATININE 1.37*  CALCIUM  8.3*  GLUCOSE 176*   Lab Results  Component Value Date   CHOL 200 (H) 06/15/2022   HDL 54 06/15/2022   LDLCALC 129 (H) 06/15/2022   TRIG 71 06/15/2022   BNP (last 3 results) Recent Labs    03/29/22 1553 04/30/22 0932 12/19/22 1006  BNP 2,251.7* 3,033.1* 1,554.2*    ProBNP (last 3 results) No results for input(s): "PROBNP" in the last 8760 hours.   Diagnostic Studies/Procedures   US RENAL  Result Date: 01/15/2023 CLINICAL DATA:  Acute kidney insufficiency. EXAM: RENAL / URINARY TRACT ULTRASOUND COMPLETE COMPARISON:  Abdominal CT 08/03/2022. FINDINGS: Right Kidney: Renal measurements: 10.2 x 8.0 x 6.1 cm = volume: 258.6 mL. Echogenicity within normal limits. No mass or hydronephrosis visualized. Left Kidney: Surgically absent as per clinical history. No abnormal soft tissue or fluid in the left renal bed. Please correlate with the prior CT scan however. Bladder: Foley catheter in the urinary bladder. Other: Some limitation with overlapping bowel gas and soft tissue. IMPRESSION: Left kidney surgically absent as per history. No right-sided collecting system dilatation. Foley catheter in the mildly distended urinary bladder. Electronically Signed   By: Karen Kays M.D.   On: 01/15/2023 17:33    Discharge Medications   Allergies as of 01/17/2023       Reactions   Ibuprofen Hives   Nsaids    ELEVATED LFT'S        Medication List     STOP taking these medications    carvedilol 3.125 MG tablet Commonly known as: COREG   Entresto 49-51 MG Generic drug: sacubitril-valsartan   spironolactone 25 MG tablet Commonly known as: ALDACTONE       TAKE these medications    aspirin 81 MG tablet Take 81 mg by mouth daily.   atorvastatin 20 MG tablet Commonly known as: LIPITOR Take 1 tablet (20 mg total) by mouth daily.   colchicine 0.6 MG tablet Take 0.6 mg by mouth as needed (gout flare).   loratadine 10 MG tablet Commonly known as: CLARITIN Take 10 mg by mouth daily  as needed for rhinitis.   metolazone 2.5 MG tablet Commonly known as: ZAROXOLYN Take 2.5 mg by mouth daily as needed (as orderd by the doctor).   mexiletine 200 MG capsule Commonly known as: MEXITIL Take 1 capsule (200 mg total) by mouth every 12 (twelve) hours.   milrinone 20 MG/100 ML Soln infusion Commonly known as: PRIMACOR Inject 0.018 mg/min into the vein continuous.   oxyCODONE 5 MG immediate release tablet Commonly known as: Oxy IR/ROXICODONE Take 1 tablet (5 mg total) by mouth 3 (three) times daily as needed for severe pain.  Potassium Chloride ER 20 MEQ Tbcr Take 40 mEq by mouth as needed. When he takes metolazone   torsemide 20 MG tablet Commonly known as: DEMADEX Take 3 tablets daily Start taking on: January 18, 2023 What changed: See the new instructions.               Durable Medical Equipment  (From admission, onward)           Start     Ordered   01/16/23 1402  Heart failure home health orders  (Heart failure home health orders / Face to face)  Once       Comments: Heart Failure Follow-up Care:  Verify follow-up appointments per Patient Discharge Instructions. Confirm transportation arranged. Reconcile home medications with discharge medication list. Remove discontinued medications from use. Assist patient/caregiver to manage medications using pill box. Reinforce low sodium food selection Assessments: Vital signs and oxygen saturation at each visit. Assess home environment for safety concerns, caregiver support and availability of low-sodium foods. Consult Child psychotherapist, PT/OT, Dietitian, and CNA based on assessments. Perform comprehensive cardiopulmonary assessment. Notify MD for any change in condition or weight gain of 3 pounds in one day or 5 pounds in one week with symptoms. Daily Weights and Symptom Monitoring: Ensure patient has access to scales. Teach patient/caregiver to weigh daily before breakfast and after voiding using same scale and  record.    Teach patient/caregiver to track weight and symptoms and when to notify Provider. Activity: Develop individualized activity plan with patient/caregiver.  Home Paraenteral Inotropic Therapy : Data Collection Form  Patients name: Lovina Reach   Date: 01/16/23  Information below may not be completed by the supplier nor anyone in a Financial relationship with the supplier.  NYHA IV  1. Results of invasive hemodynamic monitoring  Cardiac Index Before Inotrope infusion:            1.8            On Inotrope infusion:            2.1              Drug and dose:   Milrinone 0.25 mcg  2. Cardiac medications immediately prior to inotrope infusion (List name, dose, and frequency)  None   3. Dose this represent maximum tolerated doses of these medications? Yes.   4. Breathing status Prior to inotrope infusion: Dyspnea at rest  At time of discharge: Dyspnea on moderate  exertion.   5. Initial home prescription Drug and Dose:   Milrinone 0.25 mcg  for continuous infusion 24/hr day and 7 days/week  6. If continuous infusion is prescribed, have attempts to discontinue inotrope infusion in the hospital failed?   Yes.   7. If intermittent infusion is prescribed, have there been repeated hospitalizations for heart failure which Parenteral inotrope were required? Not applicable.   8. Is patient capable of going to the physician for outpatient evaluation? Yes.    9. Is routine electrocardiographic monitoring required in the Home?  No.   The above statements and any additional explanations included separately are true and accurate and there is documentation present in the patients medical record to support these statements.   Completed by Tonye Becket, NP   In instances where this form was completed by an Advanced Practice Provider, please see EMR for physician Co-Signature.  [facility] to provide  Labs every other week to include BMET, Mg, and CBC with Diff. Additional as needed. Should  be drawn via PERIPHERAL stick.  NOT PICC line.   W0981 Milrinone 0.25  mcg/kg/min X 52 weeks  A4221 Supplies for maintenance of drug infusion catheter A4222 Supplies for the external drug infusion per cassette or bag E0781 Ambulatory Infusion pump  Question Answer Comment  Heart Failure Follow-up Care Advanced Heart Failure (AHF) Clinic at [phone]   Obtain the following labs Basic Metabolic Panel   Lab frequency Weekly   Fax lab results to AHF Clinic at [phone]   Diet Low Sodium Heart Healthy   Fluid restrictions: 2000 mL Fluid   Initiate Heart Failure Clinic Diuretic Protocol to be used by [facility] only ( to be ordered by Heart Failure Team Providers Only) No      01/16/23 1404            Disposition   The patient will be discharged in stable condition to [facility] for transplant evaluation  Discharge Instructions     (HEART FAILURE PATIENTS) Call MD:  Anytime you have any of the following symptoms: 1) 3 pound weight gain in 24 hours or 5 pounds in 1 week 2) shortness of breath, with or without a dry hacking cough 3) swelling in the hands, feet or stomach 4) if you have to sleep on extra pillows at night in order to breathe.   Complete by: As directed    Diet - low sodium heart healthy   Complete by: As directed    Heart Failure patients record your daily weight using the same scale at the same time of day   Complete by: As directed    Increase activity slowly   Complete by: As directed        Follow-up Information      [facility] Follow up on 01/22/2023.   Specialty: Cardiology Why: at 8:30 Contact information: [address] [place] [address] [phone]                  Duration of Discharge Encounter: Greater than 35 minutes   Signed, Tonye Becket NP-C  01/17/2023, 8:19 AM

## 2023-01-16 NOTE — Plan of Care (Signed)
  Problem: Activity: Goal: Ability to return to baseline activity level will improve Outcome: Progressing   Problem: Education: Goal: Knowledge of General Education information will improve Description: Including pain rating scale, medication(s)/side effects and non-pharmacologic comfort measures Outcome: Progressing   

## 2023-01-16 NOTE — TOC Progression Note (Addendum)
Transition of Care [facility]) - Progression Note    Patient Details  Name: [name] MRN: [id number] Date of Birth: 21-Nov-1970  Transition of Care [facility]) CM/SW Contact  Elliot Cousin, RN Phone Number: [phone] 01/16/2023, 1:03 PM  Clinical Narrative:   CM spoke to pt and offered choice for [facility]. Pt agreeable to agency that will accept in the area for Home IV Milrinone. Contacted [facility] rep, Pam with new referral.   [facility]-unable to staff IV Milrinone [facility]-unable to staff IV Milrinone [facility]-unable to staff IV Milrinone Waiting confirmation from [facility] rep, Morrie Sheldon of they can provide HH.  [facility] unable to service patient for [facility] RN. Contacted [facility] rep, Morrie Sheldon. States he will check his plan. Insurance is out of network, unable to service for IV Milrinone.  [facility]-unable to service  [facility]-unable to service [facility]-unable to service, d/t staffing   Expected Discharge Plan: Home w Home Health Services Barriers to Discharge: Continued Medical Work up  Expected Discharge Plan and Services   Discharge Planning Services: CM Consult Post Acute Care Choice: Home Health Living arrangements for the past 2 months: Single Family Home                           HH Arranged: RN HH Agency: [facility] Date HH Agency Contacted: 01/16/23 Time HH Agency Contacted: 1303 Representative spoke with at [facility] Agency: Jeri Modena   Social Determinants of Health (SDOH) Interventions SDOH Screenings   Food Insecurity: No Food Insecurity (01/15/2023)  Housing: Low Risk  (01/15/2023)  Transportation Needs: No Transportation Needs (01/15/2023)  Utilities: Not At Risk (01/15/2023)  Alcohol Screen: Low Risk  (07/13/2022)  Depression (PHQ2-9): Low Risk  (07/13/2022)  Financial Resource Strain: Low Risk  (07/13/2022)  Physical Activity: Inactive (07/13/2022)  Social Connections: Moderately Isolated (07/13/2022)  Stress: No Stress Concern  Present (07/13/2022)  Tobacco Use: High Risk (01/15/2023)    Readmission Risk Interventions     No data to display

## 2023-01-17 ENCOUNTER — Other Ambulatory Visit (HOSPITAL_COMMUNITY): Payer: Self-pay

## 2023-01-17 DIAGNOSIS — I5023 Acute on chronic systolic (congestive) heart failure: Secondary | ICD-10-CM | POA: Diagnosis not present

## 2023-01-17 MED ORDER — MILRINONE LACTATE IN DEXTROSE 20-5 MG/100ML-% IV SOLN: 0.2500 ug/kg/min | INTRAVENOUS | Status: DC

## 2023-01-17 MED ORDER — TORSEMIDE 20 MG PO TABS: ORAL_TABLET | ORAL | 3 refills | Status: DC

## 2023-01-17 NOTE — Progress Notes (Signed)
This RN provided discharge instructions to patient. All questions answered. Patient taken to main entrance via wheelchair by Kandis Mannan, NT

## 2023-01-17 NOTE — TOC Transition Note (Signed)
Transition of Care [facility]) - CM/SW Discharge Note   Patient Details  Name: [name] MRN: [id number] Date of Birth: 17-Jan-1971  Transition of Care [facility]) CM/SW Contact:  Gala Lewandowsky, RN Phone Number: 01/17/2023, 10:21 AM   Clinical Narrative: Case Manager called Elita Quick with [facility]; she will connect the patient between 2-3 pm today for home IV Milrinone. [facility] will follow the patient and visit on Friday or Saturday. No further needs identified at this time.     Final next level of care: Home w Home Health Services Barriers to Discharge: No Barriers Identified   Patient Goals and CMS Choice CMS Medicare.gov Compare Post Acute Care list provided to:: Patient Choice offered to / list presented to : Patient  Discharge Plan and Services Additional resources added to the After Visit Summary for     Discharge Planning Services: CM Consult Post Acute Care Choice: Home Health             HH Arranged: RN [facility] Agency: [facility] Date HH Agency Contacted: 01/16/23 Time HH Agency Contacted: 1303 Representative spoke with at [facility] Agency: Jeri Modena  Social Determinants of Health (SDOH) Interventions SDOH Screenings   Food Insecurity: No Food Insecurity (01/15/2023)  Housing: Low Risk  (01/15/2023)  Transportation Needs: No Transportation Needs (01/15/2023)  Utilities: Not At Risk (01/15/2023)  Alcohol Screen: Low Risk  (07/13/2022)  Depression (PHQ2-9): Low Risk  (07/13/2022)  Financial Resource Strain: Low Risk  (07/13/2022)  Physical Activity: Inactive (07/13/2022)  Social Connections: Moderately Isolated (07/13/2022)  Stress: No Stress Concern Present (07/13/2022)  Tobacco Use: High Risk (01/15/2023)     Readmission Risk Interventions     No data to display

## 2023-01-17 NOTE — Progress Notes (Addendum)
Advanced Heart Failure Rounding Note  PCP-Cardiologist: Arvilla Meres, MD   Subjective:   Admitted after cath  01/15/23: RA 10 PA 39/15 (26) PCW 17 Fick 4.1/2.2 TD 3.2/1.7 PVR 2.2 . CO-OX 48%. Started on milrinone 0.125. BP soft. Norepi added and milrinone increased to 0.25 mcg.    8/7 - started on Mexitil to suppress PVCs. Tunneled PICC placed.   Remains on milrinone 0.25 mcg. CO-OX 68%.   Denies SOB. Denies dizziness.   Objective:   Weight Range: 71.8 kg Body mass index is 24.07 kg/m.   Vital Signs:   Temp:  [97.5 F (36.4 C)-98.1 F (36.7 C)] 98 F (36.7 C) (08/08 0000) Pulse Rate:  [71-101] 73 (08/08 0700) Resp:  [10-24] 14 (08/08 0700) BP: (81-116)/(50-80) 85/59 (08/08 0700) SpO2:  [84 %-99 %] 95 % (08/08 0700) Weight:  [71.8 kg] 71.8 kg (08/08 0500) Last BM Date : 01/14/23  Weight change: Filed Weights   01/15/23 0605 01/16/23 0500 01/17/23 0500  Weight: 70.8 kg 73 kg 71.8 kg    Intake/Output:   Intake/Output Summary (Last 24 hours) at 01/17/2023 0708 Last data filed at 01/17/2023 0700 Gross per 24 hour  Intake 706.68 ml  Output 1510 ml  Net -803.32 ml      Physical Exam  General:  No resp difficulty HEENT: normal Neck: supple. no JVD. Carotids 2+ bilat; no bruits. No lymphadenopathy or thryomegaly appreciated. Cor: PMI nondisplaced. Regular rate & rhythm. No rubs, gallops or murmurs. Tunnled PICC  Lungs: clear Abdomen: soft, nontender, nondistended. No hepatosplenomegaly. No bruits or masses. Good bowel sounds. Extremities: no cyanosis, clubbing, rash, edema Neuro: alert & orientedx3, cranial nerves grossly intact. moves all 4 extremities w/o difficulty. Affect pleasant  Telemetry   SR/ST with rare PVC  EKG    N/A  Labs    CBC Recent Labs    01/15/23 0554 01/15/23 0755 01/15/23 0756  WBC 3.4*  --   --   HGB 12.9* 12.2* 11.6*  HCT 38.8* 36.0* 34.0*  MCV 91.9  --   --   PLT 221  --   --    Basic Metabolic Panel Recent Labs     01/16/23 0847 01/17/23 0337  NA 131* 133*  K 3.9 4.3  CL 94* 99  CO2 25 26  GLUCOSE 200* 176*  BUN 47* 36*  CREATININE 1.63* 1.37*  CALCIUM 8.2* 8.3*  MG 1.9 2.2   Liver Function Tests No results for input(s): "AST", "ALT", "ALKPHOS", "BILITOT", "PROT", "ALBUMIN" in the last 72 hours. No results for input(s): "LIPASE", "AMYLASE" in the last 72 hours. Cardiac Enzymes No results for input(s): "CKTOTAL", "CKMB", "CKMBINDEX", "TROPONINI" in the last 72 hours.  BNP: BNP (last 3 results) Recent Labs    03/29/22 1553 04/30/22 0932 12/19/22 1006  BNP 2,251.7* 3,033.1* 1,554.2*    ProBNP (last 3 results) No results for input(s): "PROBNP" in the last 8760 hours.   D-Dimer No results for input(s): "DDIMER" in the last 72 hours. Hemoglobin A1C No results for input(s): "HGBA1C" in the last 72 hours. Fasting Lipid Panel No results for input(s): "CHOL", "HDL", "LDLCALC", "TRIG", "CHOLHDL", "LDLDIRECT" in the last 72 hours. Thyroid Function Tests No results for input(s): "TSH", "T4TOTAL", "T3FREE", "THYROIDAB" in the last 72 hours.  Invalid input(s): "FREET3"  Other results:   Imaging    No results found.   Medications:     Scheduled Medications:  aspirin EC  81 mg Oral Daily   atorvastatin  20 mg Oral Daily  Chlorhexidine Gluconate Cloth  6 each Topical Daily   enoxaparin (LOVENOX) injection  30 mg Subcutaneous Q24H   mexiletine  200 mg Oral Q12H   sodium chloride flush  3 mL Intravenous Q12H   sodium chloride flush  3 mL Intravenous Q12H   tamsulosin  0.4 mg Oral Daily    Infusions:  sodium chloride     sodium chloride Stopped (01/16/23 1239)   milrinone 0.25 mcg/kg/min (01/17/23 0700)   norepinephrine (LEVOPHED) Adult infusion Stopped (01/16/23 1238)    PRN Medications: sodium chloride, sodium chloride, acetaminophen, ondansetron (ZOFRAN) IV, mouth rinse, oxyCODONE, sodium chloride flush, sodium chloride flush    Patient Profile   ony is a 52 y.o.  male with history of NICM (diagnosed 10/2010 - norm cors 2012 and 9/18), chronic systolic heart failure EF 15% (08/2013), S/P ICD [employer], smoker, Wilms tumor s/p nephrectomy at age 13 had chemoradiation, scoliosis.    Admitted with A/C HFrEF.    Assessment/Plan  1. A/C HFrEF  Nonischemic cardiomyopathy - Echo 10/15 EF 10%.  - Echo 2/18 EF 15% RV mild HK  [employer] ICD.   - Echo 6/20 EF 15% RV normal severe TR - Cath 9/18: normal cors. Relatively well-compensated hemodynmaics - W/u has revealed evidence of cirrhosis which I suspect is due to a combination of ETOH and RV failure/severe TR. Synthetic function currently seems ok  - Doubt TV repair will be sufficient for him so question will be if he will still be a transplant candidate. GI has seen and feels liver disease likely not severe enough to be a barrier to transplant.  May need [facility] team to weigh in as well. - CPX 7/23 pVO2 14.5 (40%) slope 34  Hypotensive BP response  - CPX  12/31/22; pVO2 15.2 (42%) slope 35% Flat BP response  - Echo (12/[facility] EF <15%, mild RV systolic dysfunction mild to mod MR, mod TR, RVSP 51 mmHg - Now with NYHA IV symptoms and AKI - RHC 01/15/23: RA 10 PA 39/15 (26) PCW 17 Fick 4.1/2.2 TD 3.2/1.7 PVR 2.2 . -Started on milrinone 0.125 mcg and increased to Milrinone 0.25 mcg. CO-OX stable 68% - Volume status stable. Start torsemide 20 mg daily tomorrow.  - Continue to hold GDMT.  - No bb.   2. AKI has h/o solitary kidney in setting of previous Wilms tumor - Baseline Scr 1.8 - SCr on admit 2.9-->1.37 today.   - Suspect combination of cardiorenal (low output) and ATN from recent dehydration - Hold diuretics, ARNI, spiro and SGLT2i - Support with milrinone - Renal u/s  3. Severe TR - Echo  severe TR but TV seems structurally intact. ? If related to ICD wire or just dilated annulus.  - Doubt TV repair will be sufficient for him - No change   4. Cirrhosis - Due to RV failure +/-  ETOH - Appreciate GI input. May need heart/liver tx   5. VT/PVC - 03/27/22 had successful ATP. Appropriate therapy for VT.   - Keep K> 4.0 Mg > 2. - High PVC burden 45%. Mexiletine added. PVCs suppressed.  - Try to avoid amio with cirrhosis.   6. Urinary Retention  On flomax.   HH set up for home milrinone.    Length of Stay: 2  Tonye Becket, NP  01/17/2023, 7:08 AM  Advanced Heart Failure Team Pager [phone] (M-F; 7a - 5p)  Please contact [facility] for night-coverage after hours (5p -7a ) and weekends on [email or web address]  Patient seen and examined with the above-signed Advanced Practice Provider and/or Housestaff. I personally reviewed laboratory data, imaging studies and relevant notes. I independently examined the patient and formulated the important aspects of the plan. I have edited the note to reflect any of my changes or salient points. I have personally discussed the plan with the patient and/or family.  Continues to improve with milrinone. Co-ox 68% Scr down to 1.37. PVCs suppressed with mexilitene. Tunneled PICC placed  SBP 80-90s  General:  Sitting up in bed No resp difficulty HEENT: normal Neck: supple. JVP 8-9 Carotids 2+ bilat; no bruits. No lymphadenopathy or thryomegaly appreciated. + tunneled PICC Cor: . Regular rate & rhythm. 2/6 TR Lungs: clear Abdomen: soft, nontender, nondistended. No hepatosplenomegaly. No bruits or masses. Good bowel sounds. Extremities: no cyanosis, clubbing, rash, edema Neuro: alert & orientedx3, cranial nerves grossly intact. moves all 4 extremities w/o difficulty. Affect pleasant  Will ambulate him today. If BP stable, can likely go home today on home milrinone and close f/u with [facility] team.   Would start back torsemide 60 daily (was on 60/40). Hold Entresto, spiro and carvedilol for now.   Would stop Flomax and see if he can urinate on his own. (Foley placed for urinary retention on admit)  Arvilla Meres, MD  7:53  AM

## 2023-01-18 ENCOUNTER — Other Ambulatory Visit: Payer: Self-pay

## 2023-01-18 ENCOUNTER — Encounter (HOSPITAL_COMMUNITY): Payer: Self-pay | Admitting: Emergency Medicine

## 2023-01-18 ENCOUNTER — Emergency Department (HOSPITAL_COMMUNITY)
Admission: EM | Admit: 2023-01-18 | Discharge: 2023-01-18 | Disposition: A | Discharge status: Home / Self Care | Payer: Medicare HMO | Attending: Emergency Medicine | Admitting: Emergency Medicine

## 2023-01-18 ENCOUNTER — Telehealth: Payer: Self-pay

## 2023-01-18 DIAGNOSIS — T82118A Breakdown (mechanical) of other cardiac electronic device, initial encounter: Secondary | ICD-10-CM | POA: Diagnosis not present

## 2023-01-18 DIAGNOSIS — I5022 Chronic systolic (congestive) heart failure: Secondary | ICD-10-CM | POA: Diagnosis not present

## 2023-01-18 DIAGNOSIS — Z7982 Long term (current) use of aspirin: Secondary | ICD-10-CM | POA: Insufficient documentation

## 2023-01-18 DIAGNOSIS — T82594A Other mechanical complication of infusion catheter, initial encounter: Secondary | ICD-10-CM | POA: Insufficient documentation

## 2023-01-18 DIAGNOSIS — T85698A Other mechanical complication of other specified internal prosthetic devices, implants and grafts, initial encounter: Secondary | ICD-10-CM

## 2023-01-18 DIAGNOSIS — Y712 Prosthetic and other implants, materials and accessory cardiovascular devices associated with adverse incidents: Secondary | ICD-10-CM | POA: Diagnosis not present

## 2023-01-18 NOTE — Transitions of Care (Post Inpatient/ED Visit) (Signed)
   01/18/2023  Name: [name] MRN: [id number] DOB: 21-May-1971  Today's TOC FU Call Status: Today's TOC FU Call Status:: Unsuccessful Call (1st Attempt) Unsuccessful Call (1st Attempt) Date: 01/18/23  Attempted to reach the patient regarding the most recent Inpatient/ED visit.   Follow Up Plan: Additional outreach attempts will be made to reach the patient to complete the Transitions of Care (Post Inpatient/ED visit) call.   Jodelle Gross, RN, BSN, CCM Care Management Coordinator [place] Phone: [phone]/Fax: [phone]

## 2023-01-18 NOTE — Discharge Instructions (Signed)
Please call the Device company in the morning to ensure no other adjustments need to be made to your device and to obtain the code to use if needed in the future.

## 2023-01-18 NOTE — ED Triage Notes (Signed)
Pt states his continuous infusion medication pump woke him up alarming that "air was in line of tubing". Pt attempted to contact 24hr service number he was given for "pump problems" but was unable to get the answers he needed and so he was referred to ER for further management.

## 2023-01-18 NOTE — ED Provider Notes (Signed)
[place] EMERGENCY DEPARTMENT AT [facility]  Provider Note  CSN: [id number] Arrival date & time: 01/18/23 4098  History Chief Complaint  Patient presents with   Cardiac Pump Malfunction    [name] is a 52 y.o. male with a history of nonischemic cardiomyopathy was discharged in the afternoon of 8/8 after a RHC with a milrinone drip via a tunneled PICC in R upper chest. He was woken from sleep a short time ago with the pump alarming 'Air in [place]'. He attempted to contact the manufacturer but they were unable to troubleshoot over the phone and advised him to come to the ED. He is otherwise asymptomatic.    Home Medications Prior to Admission medications   Medication Sig Start Date End Date Taking? Authorizing Provider  aspirin 81 MG tablet Take 81 mg by mouth daily.    [provider]  atorvastatin (LIPITOR) 20 MG tablet Take 1 tablet (20 mg total) by mouth daily. 06/19/22   Donita Brooks, MD  colchicine 0.6 MG tablet Take 0.6 mg by mouth as needed (gout flare).    [provider]  loratadine (CLARITIN) 10 MG tablet Take 10 mg by mouth daily as needed for rhinitis.    [provider]  metolazone (ZAROXOLYN) 2.5 MG tablet Take 2.5 mg by mouth daily as needed (as orderd by the doctor). 01/14/23   [provider]  mexiletine (MEXITIL) 200 MG capsule Take 1 capsule (200 mg total) by mouth every 12 (twelve) hours. 01/16/23   [name], Bevelyn Buckles, MD  milrinone (PRIMACOR) 20 MG/100 ML SOLN infusion Inject 0.018 mg/min into the vein continuous. 01/17/23   Clegg, Amy D, NP  oxyCODONE (OXY IR/ROXICODONE) 5 MG immediate release tablet Take 1 tablet (5 mg total) by mouth 3 (three) times daily as needed for severe pain. 01/10/23   Donita Brooks, MD  Potassium Chloride ER 20 MEQ TBCR Take 40 mEq by mouth as needed. When he takes metolazone    [provider]  torsemide (DEMADEX) 20 MG tablet Take 3 tablets daily 01/18/23   Clegg, Amy D, NP      Allergies    Ibuprofen and Nsaids   Review of Systems   Review of Systems Please see HPI for pertinent positives and negatives  Physical Exam BP 98/69   Pulse (!) 103   Temp 98.2 F (36.8 C) (Oral)   Resp 18   Ht 5\' 8"  (1.727 m)   Wt 72 kg   SpO2 94%   BMI 24.14 kg/m   Physical Exam Vitals and nursing note reviewed.  HENT:     Head: Normocephalic.     Nose: Nose normal.  Eyes:     Extraocular Movements: Extraocular movements intact.  Pulmonary:     Effort: Pulmonary effort is normal.  Musculoskeletal:        General: Normal range of motion.     Cervical back: Neck supple.  Skin:    Findings: No rash (on exposed skin).  Neurological:     Mental Status: He is alert and oriented to person, place, and time.  Psychiatric:        Mood and Affect: Mood normal.     ED Results / Procedures / Treatments   EKG None  Procedures Procedures  Medications Ordered in the ED Medications - No data to display  Initial Impression and Plan  Patient here with milrinone pump malfunction. The pump was paused and inspected. No air visible in the line.  We attempted to re-prime the line but the device requires a code which is unknown to the patient and not included in his paperwork. We restarted the pump and it appears to be functioning normally. Reattached to his PICC, will monitor in the ED for some time to ensure proper function continues.   ED Course   Clinical Course as of 01/18/23 0401  Caleen Essex Jan 18, 2023  6578 Patient's pump continues to operate normally and he is ready to go home. Recommend he call the home health agency and/or his cardiologist in the morning. RTED for any other concerns.  [CS]    Clinical Course User Index [CS] Pollyann Savoy, MD     MDM Rules/Calculators/A&P Medical Decision Making Problems Addressed: Malfunction of device, initial encounter: acute illness or injury     Final Clinical Impression(s) / ED Diagnoses Final diagnoses:   Malfunction of device, initial encounter    Rx / DC Orders ED Discharge Orders     None        Pollyann Savoy, MD 01/18/23 0401

## 2023-01-19 DIAGNOSIS — I5023 Acute on chronic systolic (congestive) heart failure: Secondary | ICD-10-CM | POA: Diagnosis not present

## 2023-01-21 ENCOUNTER — Telehealth: Payer: Self-pay

## 2023-01-21 DIAGNOSIS — I5023 Acute on chronic systolic (congestive) heart failure: Secondary | ICD-10-CM | POA: Diagnosis not present

## 2023-01-21 NOTE — Transitions of Care (Post Inpatient/ED Visit) (Signed)
   01/21/2023  Name: [name] MRN: [id number] DOB: 06-02-1971  Today's TOC FU Call Status: Today's TOC FU Call Status:: Unsuccessful Call (2nd Attempt) Unsuccessful Call (2nd Attempt) Date: 01/21/23  Attempted to reach the patient regarding the most recent Inpatient/ED visit.  Follow Up Plan: Additional outreach attempts will be made to reach the patient to complete the Transitions of Care (Post Inpatient/ED visit) call.   Jodelle Gross, RN, BSN, CCM Care Management Coordinator [place] Phone: [phone]/Fax: [phone]

## 2023-01-22 ENCOUNTER — Ambulatory Visit (HOSPITAL_COMMUNITY): Admission: RE | Admit: 2023-01-22 | Payer: Medicare HMO | Source: Ambulatory Visit

## 2023-01-22 ENCOUNTER — Ambulatory Visit (HOSPITAL_COMMUNITY)
Admit: 2023-01-22 | Discharge: 2023-01-22 | Disposition: A | Discharge status: Home / Self Care | Payer: Medicare HMO | Source: Ambulatory Visit | Attending: Adult Health | Admitting: Adult Health

## 2023-01-22 ENCOUNTER — Telehealth: Payer: Self-pay

## 2023-01-22 ENCOUNTER — Encounter (HOSPITAL_COMMUNITY): Payer: Medicare HMO

## 2023-01-22 VITALS — BP 112/70 | HR 110 | Wt 154.2 lb

## 2023-01-22 DIAGNOSIS — I5022 Chronic systolic (congestive) heart failure: Secondary | ICD-10-CM | POA: Insufficient documentation

## 2023-01-22 DIAGNOSIS — I420 Dilated cardiomyopathy: Secondary | ICD-10-CM

## 2023-01-22 DIAGNOSIS — I428 Other cardiomyopathies: Secondary | ICD-10-CM | POA: Insufficient documentation

## 2023-01-22 DIAGNOSIS — I493 Ventricular premature depolarization: Secondary | ICD-10-CM | POA: Diagnosis not present

## 2023-01-22 DIAGNOSIS — I071 Rheumatic tricuspid insufficiency: Secondary | ICD-10-CM

## 2023-01-22 DIAGNOSIS — K7460 Unspecified cirrhosis of liver: Secondary | ICD-10-CM | POA: Diagnosis not present

## 2023-01-22 DIAGNOSIS — K7469 Other cirrhosis of liver: Secondary | ICD-10-CM | POA: Diagnosis not present

## 2023-01-22 DIAGNOSIS — Z905 Acquired absence of kidney: Secondary | ICD-10-CM | POA: Diagnosis not present

## 2023-01-22 DIAGNOSIS — Z95810 Presence of automatic (implantable) cardiac defibrillator: Secondary | ICD-10-CM | POA: Diagnosis not present

## 2023-01-22 DIAGNOSIS — I472 Ventricular tachycardia, unspecified: Secondary | ICD-10-CM | POA: Insufficient documentation

## 2023-01-22 DIAGNOSIS — I361 Nonrheumatic tricuspid (valve) insufficiency: Secondary | ICD-10-CM | POA: Insufficient documentation

## 2023-01-22 NOTE — Progress Notes (Signed)
Advanced Heart Failure Clinic Note    PCP: Donita Brooks, MD Primary Cardiologist: Dr Diona Browner HF Cardiologist: Dr Gala Romney [name]: Dr Edwena Blow  HPI: President is a 52 y.o. male with history of NICM (diagnosed 10/2010 - norm cors 2012 and 9/18), chronic systolic heart failure EF 15% (08/2013), S/P ICD [employer], smoker, Wilms tumor s/p nephrectomy at age 7 had chemoradiation, scoliosis.    He was seen the end of March 2015 and had large recurrent R pleural effusion. Referred to Dr. Donata Clay who placed Pleurex catheter on 4/10, removed on 09/28/13.   CPX (5/15) with RER 1.06, VO2 max 18.5, VE/VCO2 slope 32.1.  Moderate to severe functional limitation, circulatory.  CPX 09/17/16 Peak VO2: 20.5 (53% predicted peak VO2) Slope 35 Underwent L/RHC on 02/12/17. Had normal coronaries and relatively well-compensated hemodynamics. EF 20%. Echo 6/20 EF 15% RV mild HK with severe TR Personally reviewed CPX 8/20 with pVO2 17.1 (down from 20.5)  Slope 35 (stable).   CT abdomen 10/20 c/w cirrhosis. Paracentesis 03/02/19: 3.7 L off. No need for repeat paracenteses  Has been evaluated at [facility] by Dr Edwena Blow for possible transplant. Seen last in 4/21 - felt too early for transplant but getting closer.  Echo 12/17/20: LVEF < 20% RV normal severe TR  Admitted 3/23 due to volume overload, fever and cough. Purulent sinus drainage. Underwent IV diuresis.  But also received abx. Echo 09/04/21 unchanged. LVEF < 20% G3DD. Severe TR. Entresto and spiro stopped due to low BP  He was seen in the HF clinic 4/23. CPX repeat and referred to [facility]   On 03/27/22 had VT with successful ATP. EP reviewed and therapies were appropriate. He was unaware of the event. Called by EP and given [place] DMV no driving for 6 months.   Echo 05/22/22: EF <15%, mild RV systolic dysfunction mild to mod MR, mod TR, RVSP 51 mmHg  RHC 05/24/22: RA 7 PA 47/24 (32) W 24 CI 2.5 PVR 1.8 hepatic wedge 10    Seen by GI for  cirrhosis. CT scan in 2/24 which showed stable appearing cirrhotic liver with no mass lesions. Found to have benign left adrenal adenoma. Urine studies ruled out pheochromocytoma.   Colonoscopy 10/31/22 congested mucosa due to portal HTN. + hemorrhoids. No mass.  Last seen at [facility] Transplant clinic 10/10/22. Ongoing w/u for heart +/- liver transplant. Requesting repeat CPX.   Admitted 01/15/23 after cath with low output heart failure and AKI. Started on milrinone and titrated to 0.25 mcg with improved mixed venous saturation. Serum creatinine improved and was down to 1.4 today.  Tunneled PICC placed for home inotropes. High PVC burden noted. Mexiletine added to suppress PVCs. Amiodarone avoided with cirrhosis. Of note,  he had urinary retention and had foley placed. Started on flomax with improvement.  Discussed with [facility] Team and he is planning on outpatient follow up for ongoing transplant evaluation. He was offered transfer to [facility] but he wanted to go home for few days. Home Health arranged with [facility] for home inotropes. Discharged on milrinone 0.25 mcg. BB, ARNI, and MRA stopped. Discharged on torsemide 60 mg daily.   Today he returns for post hospital HF follow up.Overall feeling better. Anxious about possible transplant. Denies SOB/PND/Orthopnea. Denies tobacco use. Appetite ok. No fever or chills. He has not been weighing at home. Taking all medications. No issues with PICC line. Followed by [facility]. Lives alone. His sister plans to be his caregiver after transplant.   Research officer, political party  Interrogation: Activity ~ 4 hours daily. No VT.   Cardiac Studies - RHC 01/15/23  RA = 10, RV = 36/11, PA = 39/15 (26), PCW = 17, Fick cardiac output/index = 4.1/2.2, Thermo CO/CI = 3.2/1.7 PVR = 2.2 (Fick) 2.8 (TD) Ao sat = 96% PA sat = 60%, 63%PAPi = 2.4   - RHC (12/23): RA 7 PA 47/24 (32) W 24 CI 2.5 PVR 1.8 hepatic wedge 10    - Echo (12/23): EF <15%, mild RV systolic dysfunction mild to mod MR, mod TR,  RVSP 51 mmHg  - CPX (7/23) Pre-Exercise PFTs  FVC 2.79 (61%)      FEV1 2.00 (55%)        FEV1/FVC 72 (89%)        MVV 84 (58%)  Resting HR: 87 Standing HR: 88 Peak HR: 141   (82% age predicted max HR)  BP rest: 90/68 Standing BP: 86/68 BP peak: 96/62  Peak VO2: 14.5 (40% predicted peak VO2)  VeVCo slope 35  - CPX 12/17/20 FVC 2.41 (49%)      FEV1 1.51 (38%)        FEV1/FVC 63 (78%)        MVV 62 (40%)  BP rest: 106/60 Standing BP: 100/62 BP peak: 104/62  Peak VO2: 18.3 (50% predicted peak VO2)  VE/VCO2 slope:  38  Peak RER: 1.10  Ventilatory Threshold: 15.6 (42% predicted or measured peak VO2)  VE/MVV:  84%   - RHC 8/20  RA = 17 RV = 46/18 PA = 51/22 (33) PCW = 28 Fick cardiac output/index =4.0/2.2 PVR =1.0 WU Ao sat = 99% PA sat = 62%, 62% RA/PCWP = 0.61 PaPI = 1.71  SH: Lives alone. Unemployed. On disability. Does not drink alcohol.  FH: Grandfather MI  ROS: All systems negative except as listed in HPI, PMH and Problem List.  Past Medical History:  Diagnosis Date   AICD (automatic cardioverter/defibrillator) present    Arthritis    Cardiomyopathy, nonischemic (HCC)    takes Digoxin daily and Carvedilol daily   CHF (congestive heart failure) (HCC)    takes Lasix daily as well Aldactone   Chronic back pain    Chronic systolic heart failure (HCC)    Cirrhosis (HCC)    Depression    takes Prozac daily   GERD (gastroesophageal reflux disease)    takes Omeprazole daily   Hx of Alcohol consumption heavy    rare beer currently   hx of Tobacco abuse    quit smoking 2014   Hyperlipidemia    was on Simvastatin but has been off a year   Insomnia    takes Ambien as needed(just got script yesterday)   Orthostatic hypotension    Scoliosis    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 25 (XRT and chemo)   Current Outpatient Medications  Medication Sig Dispense Refill   aspirin 81 MG tablet Take 81 mg by mouth daily.     atorvastatin (LIPITOR) 20 MG tablet Take 1 tablet  (20 mg total) by mouth daily. 90 tablet 3   colchicine 0.6 MG tablet Take 0.6 mg by mouth as needed (gout flare).     loratadine (CLARITIN) 10 MG tablet Take 10 mg by mouth daily as needed for rhinitis.     metolazone (ZAROXOLYN) 2.5 MG tablet Take 2.5 mg by mouth daily as needed (as orderd by the doctor).     mexiletine (MEXITIL) 200 MG capsule Take 1 capsule (200 mg total) by mouth every  12 (twelve) hours. 60 capsule 3   milrinone (PRIMACOR) 20 MG/100 ML SOLN infusion Inject 0.018 mg/min into the vein continuous.     oxyCODONE (OXY IR/ROXICODONE) 5 MG immediate release tablet Take 1 tablet (5 mg total) by mouth 3 (three) times daily as needed for severe pain. 60 tablet 0   Potassium Chloride ER 20 MEQ TBCR Take 40 mEq by mouth as needed. When he takes metolazone     torsemide (DEMADEX) 20 MG tablet Take 3 tablets daily 150 tablet 3   No current facility-administered medications for this encounter.   BP 112/70   Pulse (!) 110   Wt 70 kg (154 lb 4 oz)   SpO2 98%   BMI 23.45 kg/m   Wt Readings from Last 3 Encounters:  01/22/23 70 kg (154 lb 4 oz)  01/18/23 72 kg (158 lb 11.7 oz)  01/17/23 71.8 kg (158 lb 4.6 oz)   PHYSICAL EXAM: General:  Well appearing. Walked in the clinic. No resp difficulty HEENT: normal Neck: supple. no JVD. Carotids 2+ bilat; no bruits. No lymphadenopathy or thryomegaly appreciated. Cor: PMI nondisplaced. Regular rate & rhythm. No rubs, gallops or murmurs. R upper chest tunneled PICC.  Lungs: clear Abdomen: soft, nontender, nondistended. No hepatosplenomegaly. No bruits or masses. Good bowel sounds. Extremities: no cyanosis, clubbing, rash, edema Neuro: alert & orientedx3, cranial nerves grossly intact. moves all 4 extremities w/o difficulty. Affect pleasant   ASSESSMENT & PLAN: 1. Chronic Systolic HF:   - Nonischemic cardiomyopathy - Echo 10/15 EF 10%.  - Echo 2/18 EF 15% RV mild HK  [employer] ICD.   - Echo 6/20 EF 15% RV normal severe TR - Cath  9/18: normal cors. Relatively well-compensated hemodynmaics - W/u has revealed evidence of cirrhosis which I suspect is due to a combination of ETOH and RV failure/severe TR. Synthetic function currently seems ok (Albumin 4.2  INR 1.3 on 9/20) - Doubt TV repair will be sufficient for him so question will be if he will still be a transplant candidate. GI has seen and feels liver disease likely not severe enough to be a barrier to transplant.  May need [facility] team to weigh in as well. - He saw Dr. Edwena Blow in 4/21 who felt we would need to proceed with advanced therapies sooner than later in his disease given cirrhosis but still felt he was a bit early. Recommended close f/u with CPX. - Admitted (3/23) with ADHF in setting of URI - Echo (3/23): EF < 20% G3DD. Severe TR  - CPX 7/23 pVO2 14.5 (40%) slope 35% Flat BP response  - Echo (12/23 at [facility]): EF <15%, mild RV systolic dysfunction mild to mod MR, mod TR, RVSP 51 mmHg - RHC (12/23 at [facility]): RA 7 PA 47/24 (32) W 24 CI 2.5 PVR 1.8 hepatic wedge 10   - RHC (01/15/23 at [facility]): RA 10 PA 39/15 (26), PCW 17 .  Discharged on milrinone 0.25 mcg.  - NYHA II. Volume status stable.  Continue torsemide 60 mg qam - Off arni, bb, mra with recent AKI and low output.  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 - Unable to tolerate digoxin due to heartburn. - Blood type A +. - He is followed at [facility] transplant center for transplant work up. He has follow up tomorrow.  - Lab work obtained by [facility] 01/21/23. Waiting on results.   2. Severe TR - Echo with moderate to severe TR but TV seems structurally intact. ? If related  to ICD wire or just dilated annulus.  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   - w/u as above - Appreciate GI input. May need heart/liver tx  4. VT/PVC - 03/27/22 had successful ATP. Appropriate therapy for VT.   - ICD interrogated. No VT.  -During recent hospitalization, high PVC burden  ~ 40%. Placed on Mexiletine with suppression noted.  - Avoid Amio with cirrhosis.   Follow up in 3 weeks in HF clinic.   Tonye Becket, NP  8:46 AM

## 2023-01-22 NOTE — Transitions of Care (Post Inpatient/ED Visit) (Signed)
01/22/2023  Name: [name] MRN: [id number] DOB: 11/11/1970  Today's TOC FU Call Status: Today's TOC FU Call Status:: Successful TOC FU Call Completed TOC FU Call Complete Date: 01/22/23  Transition Care Management Follow-up Telephone Call Date of Discharge: 01/18/23 Discharge Facility: [facility]) Type of Discharge: Inpatient Admission Primary Inpatient Discharge Diagnosis:: Chronic Systolic Heart Failure How have you been since you were released from the hospital?: Better Any questions or concerns?: No  Items Reviewed: Did you receive and understand the discharge instructions provided?: Yes Medications obtained,verified, and reconciled?: Yes (Medications Reviewed) Any new allergies since your discharge?: No Dietary orders reviewed?: No Do you have support at home?: No (Patient lives alone but his sister could help)  Medications Reviewed Today: Medications Reviewed Today     Reviewed by Jodelle Gross, RN (Case Manager) on 01/22/23 at 1253  Med List Status: <None>   Medication Order Taking? Sig Documenting Provider Last Dose Status Informant  aspirin 81 MG tablet [id number] Yes Take 81 mg by mouth daily. [provider] Taking Active Self           Med Note (HOPKINS, HEATHER L   Mon Aug 24, 2013  1:48 PM)     atorvastatin (LIPITOR) 20 MG tablet [id number] Yes Take 1 tablet (20 mg total) by mouth daily. Donita Brooks, MD Taking Active Self  colchicine 0.6 MG tablet [id number] Yes Take 0.6 mg by mouth as needed (gout flare). [provider] Taking Active Self  loratadine (CLARITIN) 10 MG tablet [id number] Yes Take 10 mg by mouth daily as needed for rhinitis. [provider] Taking Active Self  metolazone (ZAROXOLYN) 2.5 MG tablet [id number] Yes Take 2.5 mg by mouth daily as needed (as orderd by the doctor). [provider] Taking Active Self  mexiletine (MEXITIL) 200 MG capsule [id number] Yes Take 1 capsule (200 mg total) by mouth every  12 (twelve) hours. [name], Bevelyn Buckles, MD Taking Active   milrinone (PRIMACOR) 20 MG/100 ML SOLN infusion [id number] Yes Inject 0.018 mg/min into the vein continuous. Tonye Becket D, NP Taking Active   oxyCODONE (OXY IR/ROXICODONE) 5 MG immediate release tablet [id number] Yes Take 1 tablet (5 mg total) by mouth 3 (three) times daily as needed for severe pain. Donita Brooks, MD Taking Active Self  Potassium Chloride ER 20 MEQ TBCR [id number] Yes Take 40 mEq by mouth as needed. When he takes metolazone [provider] Taking Active Self  torsemide (DEMADEX) 20 MG tablet [id number] Yes Take 3 tablets daily Clegg, Amy D, NP Taking Active             Home Care and Equipment/Supplies: Were Home Health Services Ordered?: Yes Name of Home Health Agency:: [facility] Has Agency set up a time to come to your home?: Yes First Home Health Visit Date: 01/19/23 Any new equipment or medical supplies ordered?: No  Functional Questionnaire: Do you need assistance with bathing/showering or dressing?: No Do you need assistance with meal preparation?: No Do you need assistance with eating?: No Do you have difficulty maintaining continence: No Do you need assistance with getting out of bed/getting out of a chair/moving?: No Do you have difficulty managing or taking your medications?: No  Follow up appointments reviewed: PCP Follow-up appointment confirmed?: NA Specialist Hospital Follow-up appointment confirmed?: Yes Date of Specialist follow-up appointment?: 01/23/23 Follow-Up Specialty Provider:: Dr. Norlene Duel [facility] for transplant) Do you need transportation to your follow-up appointment?: No Do you understand care options if your condition(s) worsen?: Yes-patient  verbalized understanding  SDOH Interventions Today    Flowsheet Row Most Recent Value  SDOH Interventions   Food Insecurity Interventions Intervention Not Indicated  Transportation Interventions Intervention Not  Indicated     Jodelle Gross, RN, BSN, CCM Care Management Coordinator [place] Phone: [phone]/Fax: [phone]

## 2023-01-22 NOTE — Patient Instructions (Signed)
Great to see you today!!!  No changes, continue current medications  Do the following things EVERYDAY: Weigh yourself in the morning before breakfast. Write it down and keep it in a log. Take your medicines as prescribed Eat low salt foods--Limit salt (sodium) to 2000 mg per day.  Stay as active as you can everyday Limit all fluids for the day to less than 2 liters  Your physician recommends that you schedule a follow-up appointment in: 2-3 weeks  If you have any questions or concerns before your next appointment please send [place] a message through [place] or call our office at [phone].    TO LEAVE A MESSAGE FOR THE NURSE SELECT OPTION 2, PLEASE LEAVE A MESSAGE INCLUDING: YOUR NAME DATE OF BIRTH CALL BACK NUMBER REASON FOR CALL**this is important as we prioritize the call backs  YOU WILL RECEIVE A CALL BACK THE SAME DAY AS LONG AS YOU CALL BEFORE 4:00 PM  At the [facility] Clinic, you and your health needs are our priority. As part of our continuing mission to provide you with exceptional heart care, we have created designated Provider Care Teams. These Care Teams include your primary Cardiologist (physician) and Advanced Practice Providers (APPs- Physician Assistants and Nurse Practitioners) who all work together to provide you with the care you need, when you need it.   You may see any of the following providers on your designated Care Team at your next follow up: Dr Arvilla Meres Dr Marca Ancona Dr. Marcos Eke, NP Robbie Lis, [place] [facility] [place], [place] Brynda Peon, NP Karle Plumber, PharmD   Please be sure to bring in all your medications bottles to every appointment.    Thank you for choosing [facility]

## 2023-01-23 DIAGNOSIS — I428 Other cardiomyopathies: Secondary | ICD-10-CM | POA: Diagnosis not present

## 2023-01-23 DIAGNOSIS — M419 Scoliosis, unspecified: Secondary | ICD-10-CM | POA: Diagnosis not present

## 2023-01-23 DIAGNOSIS — E785 Hyperlipidemia, unspecified: Secondary | ICD-10-CM | POA: Diagnosis not present

## 2023-01-23 DIAGNOSIS — Z7682 Awaiting organ transplant status: Secondary | ICD-10-CM | POA: Diagnosis not present

## 2023-01-23 DIAGNOSIS — I5022 Chronic systolic (congestive) heart failure: Secondary | ICD-10-CM | POA: Diagnosis not present

## 2023-01-23 DIAGNOSIS — Z95810 Presence of automatic (implantable) cardiac defibrillator: Secondary | ICD-10-CM | POA: Diagnosis not present

## 2023-01-23 DIAGNOSIS — F32A Depression, unspecified: Secondary | ICD-10-CM | POA: Diagnosis not present

## 2023-01-23 DIAGNOSIS — K219 Gastro-esophageal reflux disease without esophagitis: Secondary | ICD-10-CM | POA: Diagnosis not present

## 2023-01-23 DIAGNOSIS — Z905 Acquired absence of kidney: Secondary | ICD-10-CM | POA: Diagnosis not present

## 2023-01-24 DIAGNOSIS — I5023 Acute on chronic systolic (congestive) heart failure: Secondary | ICD-10-CM | POA: Diagnosis not present

## 2023-01-29 ENCOUNTER — Telehealth: Payer: Self-pay

## 2023-01-29 DIAGNOSIS — I5022 Chronic systolic (congestive) heart failure: Secondary | ICD-10-CM | POA: Diagnosis not present

## 2023-01-29 NOTE — Telephone Encounter (Signed)
Alert [employer]:   Device alert Antitachycardia pacing (ATP) therapy delivered to convert arrhythmia Event occurred 8/19 @ 14:00, duration 16sec,  EGM shows abrupt onset of sustained VT, pace terminated with one burst of ATP There is 1 additional VT event from 8/13 and 7 NSVT  Spoke with patient, he reported that we was outside and people were working on his house, patient voiced understanding of driving restrictions x 6 months.

## 2023-01-31 ENCOUNTER — Other Ambulatory Visit (HOSPITAL_COMMUNITY): Payer: Self-pay | Admitting: Internal Medicine

## 2023-01-31 DIAGNOSIS — I5023 Acute on chronic systolic (congestive) heart failure: Secondary | ICD-10-CM | POA: Diagnosis not present

## 2023-02-01 DIAGNOSIS — J9601 Acute respiratory failure with hypoxia: Secondary | ICD-10-CM | POA: Diagnosis not present

## 2023-02-01 DIAGNOSIS — I899 Noninfective disorder of lymphatic vessels and lymph nodes, unspecified: Secondary | ICD-10-CM | POA: Diagnosis not present

## 2023-02-01 DIAGNOSIS — I428 Other cardiomyopathies: Secondary | ICD-10-CM | POA: Diagnosis not present

## 2023-02-01 DIAGNOSIS — I472 Ventricular tachycardia, unspecified: Secondary | ICD-10-CM | POA: Diagnosis not present

## 2023-02-01 DIAGNOSIS — J939 Pneumothorax, unspecified: Secondary | ICD-10-CM | POA: Diagnosis not present

## 2023-02-01 DIAGNOSIS — I5084 End stage heart failure: Secondary | ICD-10-CM | POA: Diagnosis not present

## 2023-02-01 DIAGNOSIS — I340 Nonrheumatic mitral (valve) insufficiency: Secondary | ICD-10-CM | POA: Diagnosis not present

## 2023-02-01 DIAGNOSIS — I4891 Unspecified atrial fibrillation: Secondary | ICD-10-CM | POA: Diagnosis not present

## 2023-02-01 DIAGNOSIS — I5023 Acute on chronic systolic (congestive) heart failure: Secondary | ICD-10-CM | POA: Diagnosis not present

## 2023-02-01 DIAGNOSIS — D84821 Immunodeficiency due to drugs: Secondary | ICD-10-CM | POA: Diagnosis not present

## 2023-02-01 DIAGNOSIS — R570 Cardiogenic shock: Secondary | ICD-10-CM | POA: Diagnosis not present

## 2023-02-01 DIAGNOSIS — K769 Liver disease, unspecified: Secondary | ICD-10-CM | POA: Diagnosis not present

## 2023-02-01 DIAGNOSIS — N184 Chronic kidney disease, stage 4 (severe): Secondary | ICD-10-CM | POA: Diagnosis not present

## 2023-02-01 DIAGNOSIS — D849 Immunodeficiency, unspecified: Secondary | ICD-10-CM | POA: Diagnosis not present

## 2023-02-01 DIAGNOSIS — R188 Other ascites: Secondary | ICD-10-CM | POA: Diagnosis not present

## 2023-02-01 DIAGNOSIS — I898 Other specified noninfective disorders of lymphatic vessels and lymph nodes: Secondary | ICD-10-CM | POA: Diagnosis not present

## 2023-02-01 DIAGNOSIS — E1165 Type 2 diabetes mellitus with hyperglycemia: Secondary | ICD-10-CM | POA: Diagnosis not present

## 2023-02-01 DIAGNOSIS — K761 Chronic passive congestion of liver: Secondary | ICD-10-CM | POA: Diagnosis not present

## 2023-02-01 DIAGNOSIS — T86298 Other complications of heart transplant: Secondary | ICD-10-CM | POA: Diagnosis not present

## 2023-02-01 DIAGNOSIS — I5022 Chronic systolic (congestive) heart failure: Secondary | ICD-10-CM | POA: Diagnosis not present

## 2023-02-01 DIAGNOSIS — D696 Thrombocytopenia, unspecified: Secondary | ICD-10-CM | POA: Diagnosis not present

## 2023-02-01 DIAGNOSIS — I5189 Other ill-defined heart diseases: Secondary | ICD-10-CM | POA: Diagnosis not present

## 2023-02-01 DIAGNOSIS — J95821 Acute postprocedural respiratory failure: Secondary | ICD-10-CM | POA: Diagnosis not present

## 2023-02-01 DIAGNOSIS — Z4821 Encounter for aftercare following heart transplant: Secondary | ICD-10-CM | POA: Diagnosis not present

## 2023-02-01 DIAGNOSIS — I361 Nonrheumatic tricuspid (valve) insufficiency: Secondary | ICD-10-CM | POA: Diagnosis not present

## 2023-02-01 DIAGNOSIS — Z79899 Other long term (current) drug therapy: Secondary | ICD-10-CM | POA: Diagnosis not present

## 2023-02-01 DIAGNOSIS — E46 Unspecified protein-calorie malnutrition: Secondary | ICD-10-CM | POA: Diagnosis not present

## 2023-02-01 DIAGNOSIS — Z905 Acquired absence of kidney: Secondary | ICD-10-CM | POA: Insufficient documentation

## 2023-02-01 DIAGNOSIS — Z4502 Encounter for adjustment and management of automatic implantable cardiac defibrillator: Secondary | ICD-10-CM | POA: Diagnosis not present

## 2023-02-01 DIAGNOSIS — Z794 Long term (current) use of insulin: Secondary | ICD-10-CM | POA: Diagnosis not present

## 2023-02-01 DIAGNOSIS — K5939 Other megacolon: Secondary | ICD-10-CM | POA: Diagnosis not present

## 2023-02-01 DIAGNOSIS — J948 Other specified pleural conditions: Secondary | ICD-10-CM | POA: Diagnosis not present

## 2023-02-01 DIAGNOSIS — I700 Atherosclerosis of aorta: Secondary | ICD-10-CM | POA: Diagnosis not present

## 2023-02-01 DIAGNOSIS — N179 Acute kidney failure, unspecified: Secondary | ICD-10-CM | POA: Diagnosis not present

## 2023-02-01 DIAGNOSIS — J90 Pleural effusion, not elsewhere classified: Secondary | ICD-10-CM | POA: Diagnosis not present

## 2023-02-01 DIAGNOSIS — I509 Heart failure, unspecified: Secondary | ICD-10-CM | POA: Diagnosis not present

## 2023-02-01 DIAGNOSIS — N189 Chronic kidney disease, unspecified: Secondary | ICD-10-CM | POA: Diagnosis not present

## 2023-02-01 DIAGNOSIS — Z941 Heart transplant status: Secondary | ICD-10-CM | POA: Diagnosis not present

## 2023-02-01 DIAGNOSIS — Z7984 Long term (current) use of oral hypoglycemic drugs: Secondary | ICD-10-CM | POA: Diagnosis not present

## 2023-02-01 DIAGNOSIS — I514 Myocarditis, unspecified: Secondary | ICD-10-CM | POA: Diagnosis not present

## 2023-02-01 DIAGNOSIS — J811 Chronic pulmonary edema: Secondary | ICD-10-CM | POA: Diagnosis not present

## 2023-02-01 DIAGNOSIS — I071 Rheumatic tricuspid insufficiency: Secondary | ICD-10-CM | POA: Diagnosis not present

## 2023-02-01 DIAGNOSIS — E119 Type 2 diabetes mellitus without complications: Secondary | ICD-10-CM | POA: Diagnosis not present

## 2023-02-01 DIAGNOSIS — Z4682 Encounter for fitting and adjustment of non-vascular catheter: Secondary | ICD-10-CM | POA: Diagnosis not present

## 2023-02-01 DIAGNOSIS — Q600 Renal agenesis, unilateral: Secondary | ICD-10-CM | POA: Diagnosis not present

## 2023-02-01 DIAGNOSIS — Z95810 Presence of automatic (implantable) cardiac defibrillator: Secondary | ICD-10-CM | POA: Diagnosis not present

## 2023-02-01 DIAGNOSIS — J940 Chylous effusion: Secondary | ICD-10-CM | POA: Diagnosis not present

## 2023-02-01 DIAGNOSIS — R918 Other nonspecific abnormal finding of lung field: Secondary | ICD-10-CM | POA: Diagnosis not present

## 2023-02-01 DIAGNOSIS — J9811 Atelectasis: Secondary | ICD-10-CM | POA: Diagnosis not present

## 2023-02-01 DIAGNOSIS — Z0389 Encounter for observation for other suspected diseases and conditions ruled out: Secondary | ICD-10-CM | POA: Diagnosis not present

## 2023-02-01 DIAGNOSIS — Z7682 Awaiting organ transplant status: Secondary | ICD-10-CM | POA: Diagnosis not present

## 2023-02-01 DIAGNOSIS — I959 Hypotension, unspecified: Secondary | ICD-10-CM | POA: Diagnosis not present

## 2023-02-01 DIAGNOSIS — T8621 Heart transplant rejection: Secondary | ICD-10-CM | POA: Diagnosis not present

## 2023-02-01 DIAGNOSIS — Z452 Encounter for adjustment and management of vascular access device: Secondary | ICD-10-CM | POA: Diagnosis not present

## 2023-02-01 DIAGNOSIS — G8918 Other acute postprocedural pain: Secondary | ICD-10-CM | POA: Diagnosis not present

## 2023-02-05 ENCOUNTER — Encounter (HOSPITAL_COMMUNITY): Payer: Medicare HMO

## 2023-02-05 ENCOUNTER — Other Ambulatory Visit (HOSPITAL_COMMUNITY): Payer: Self-pay | Admitting: Vascular Surgery

## 2023-02-05 DIAGNOSIS — Z4502 Encounter for adjustment and management of automatic implantable cardiac defibrillator: Secondary | ICD-10-CM | POA: Diagnosis not present

## 2023-02-05 DIAGNOSIS — I514 Myocarditis, unspecified: Secondary | ICD-10-CM | POA: Diagnosis not present

## 2023-02-05 DIAGNOSIS — I5084 End stage heart failure: Secondary | ICD-10-CM | POA: Diagnosis not present

## 2023-02-05 DIAGNOSIS — I428 Other cardiomyopathies: Secondary | ICD-10-CM | POA: Diagnosis not present

## 2023-02-06 DIAGNOSIS — Z7682 Awaiting organ transplant status: Secondary | ICD-10-CM | POA: Diagnosis not present

## 2023-02-06 DIAGNOSIS — Z941 Heart transplant status: Secondary | ICD-10-CM | POA: Insufficient documentation

## 2023-02-06 DIAGNOSIS — I428 Other cardiomyopathies: Secondary | ICD-10-CM | POA: Diagnosis not present

## 2023-02-06 DIAGNOSIS — R570 Cardiogenic shock: Secondary | ICD-10-CM | POA: Diagnosis not present

## 2023-02-10 DIAGNOSIS — D696 Thrombocytopenia, unspecified: Secondary | ICD-10-CM | POA: Insufficient documentation

## 2023-02-11 DIAGNOSIS — D849 Immunodeficiency, unspecified: Secondary | ICD-10-CM | POA: Insufficient documentation

## 2023-02-12 DIAGNOSIS — J90 Pleural effusion, not elsewhere classified: Secondary | ICD-10-CM | POA: Diagnosis not present

## 2023-02-12 DIAGNOSIS — J9811 Atelectasis: Secondary | ICD-10-CM | POA: Diagnosis not present

## 2023-02-12 DIAGNOSIS — Z941 Heart transplant status: Secondary | ICD-10-CM | POA: Diagnosis not present

## 2023-02-12 DIAGNOSIS — Z452 Encounter for adjustment and management of vascular access device: Secondary | ICD-10-CM | POA: Diagnosis not present

## 2023-02-12 DIAGNOSIS — I5022 Chronic systolic (congestive) heart failure: Secondary | ICD-10-CM | POA: Diagnosis not present

## 2023-02-13 DIAGNOSIS — Z4682 Encounter for fitting and adjustment of non-vascular catheter: Secondary | ICD-10-CM | POA: Diagnosis not present

## 2023-02-13 DIAGNOSIS — Z941 Heart transplant status: Secondary | ICD-10-CM | POA: Diagnosis not present

## 2023-02-13 DIAGNOSIS — R918 Other nonspecific abnormal finding of lung field: Secondary | ICD-10-CM | POA: Diagnosis not present

## 2023-02-13 DIAGNOSIS — Z4821 Encounter for aftercare following heart transplant: Secondary | ICD-10-CM | POA: Diagnosis not present

## 2023-02-14 DIAGNOSIS — R570 Cardiogenic shock: Secondary | ICD-10-CM | POA: Diagnosis not present

## 2023-02-14 DIAGNOSIS — Z4682 Encounter for fitting and adjustment of non-vascular catheter: Secondary | ICD-10-CM | POA: Diagnosis not present

## 2023-02-14 DIAGNOSIS — J90 Pleural effusion, not elsewhere classified: Secondary | ICD-10-CM | POA: Diagnosis not present

## 2023-02-14 DIAGNOSIS — N189 Chronic kidney disease, unspecified: Secondary | ICD-10-CM | POA: Diagnosis not present

## 2023-02-14 DIAGNOSIS — J9811 Atelectasis: Secondary | ICD-10-CM | POA: Diagnosis not present

## 2023-02-14 DIAGNOSIS — E1165 Type 2 diabetes mellitus with hyperglycemia: Secondary | ICD-10-CM | POA: Diagnosis not present

## 2023-02-14 DIAGNOSIS — N179 Acute kidney failure, unspecified: Secondary | ICD-10-CM | POA: Diagnosis not present

## 2023-02-14 DIAGNOSIS — Z794 Long term (current) use of insulin: Secondary | ICD-10-CM | POA: Diagnosis not present

## 2023-02-14 DIAGNOSIS — Z941 Heart transplant status: Secondary | ICD-10-CM | POA: Diagnosis not present

## 2023-02-14 DIAGNOSIS — I5022 Chronic systolic (congestive) heart failure: Secondary | ICD-10-CM | POA: Diagnosis not present

## 2023-02-15 DIAGNOSIS — E1165 Type 2 diabetes mellitus with hyperglycemia: Secondary | ICD-10-CM | POA: Diagnosis not present

## 2023-02-15 DIAGNOSIS — R570 Cardiogenic shock: Secondary | ICD-10-CM | POA: Diagnosis not present

## 2023-02-15 DIAGNOSIS — Z794 Long term (current) use of insulin: Secondary | ICD-10-CM | POA: Diagnosis not present

## 2023-02-15 DIAGNOSIS — I5022 Chronic systolic (congestive) heart failure: Secondary | ICD-10-CM | POA: Diagnosis not present

## 2023-02-15 DIAGNOSIS — R918 Other nonspecific abnormal finding of lung field: Secondary | ICD-10-CM | POA: Diagnosis not present

## 2023-02-15 DIAGNOSIS — Z4682 Encounter for fitting and adjustment of non-vascular catheter: Secondary | ICD-10-CM | POA: Diagnosis not present

## 2023-02-15 DIAGNOSIS — J939 Pneumothorax, unspecified: Secondary | ICD-10-CM | POA: Diagnosis not present

## 2023-02-15 DIAGNOSIS — J90 Pleural effusion, not elsewhere classified: Secondary | ICD-10-CM | POA: Diagnosis not present

## 2023-02-15 DIAGNOSIS — J9811 Atelectasis: Secondary | ICD-10-CM | POA: Diagnosis not present

## 2023-02-15 DIAGNOSIS — N189 Chronic kidney disease, unspecified: Secondary | ICD-10-CM | POA: Diagnosis not present

## 2023-02-15 DIAGNOSIS — N179 Acute kidney failure, unspecified: Secondary | ICD-10-CM | POA: Diagnosis not present

## 2023-02-15 DIAGNOSIS — Z941 Heart transplant status: Secondary | ICD-10-CM | POA: Diagnosis not present

## 2023-02-16 DIAGNOSIS — D849 Immunodeficiency, unspecified: Secondary | ICD-10-CM | POA: Diagnosis not present

## 2023-02-16 DIAGNOSIS — E119 Type 2 diabetes mellitus without complications: Secondary | ICD-10-CM | POA: Diagnosis not present

## 2023-02-16 DIAGNOSIS — J90 Pleural effusion, not elsewhere classified: Secondary | ICD-10-CM | POA: Diagnosis not present

## 2023-02-16 DIAGNOSIS — Z941 Heart transplant status: Secondary | ICD-10-CM | POA: Diagnosis not present

## 2023-02-16 DIAGNOSIS — J9811 Atelectasis: Secondary | ICD-10-CM | POA: Diagnosis not present

## 2023-02-17 DIAGNOSIS — Z941 Heart transplant status: Secondary | ICD-10-CM | POA: Diagnosis not present

## 2023-02-17 DIAGNOSIS — J90 Pleural effusion, not elsewhere classified: Secondary | ICD-10-CM | POA: Diagnosis not present

## 2023-02-17 DIAGNOSIS — J9811 Atelectasis: Secondary | ICD-10-CM | POA: Diagnosis not present

## 2023-02-17 DIAGNOSIS — J811 Chronic pulmonary edema: Secondary | ICD-10-CM | POA: Diagnosis not present

## 2023-02-17 DIAGNOSIS — I5022 Chronic systolic (congestive) heart failure: Secondary | ICD-10-CM | POA: Diagnosis not present

## 2023-02-18 DIAGNOSIS — Z794 Long term (current) use of insulin: Secondary | ICD-10-CM | POA: Diagnosis not present

## 2023-02-18 DIAGNOSIS — J9811 Atelectasis: Secondary | ICD-10-CM | POA: Diagnosis not present

## 2023-02-18 DIAGNOSIS — I5022 Chronic systolic (congestive) heart failure: Secondary | ICD-10-CM | POA: Diagnosis not present

## 2023-02-18 DIAGNOSIS — J811 Chronic pulmonary edema: Secondary | ICD-10-CM | POA: Diagnosis not present

## 2023-02-18 DIAGNOSIS — Z4682 Encounter for fitting and adjustment of non-vascular catheter: Secondary | ICD-10-CM | POA: Diagnosis not present

## 2023-02-18 DIAGNOSIS — J90 Pleural effusion, not elsewhere classified: Secondary | ICD-10-CM | POA: Diagnosis not present

## 2023-02-18 DIAGNOSIS — E1165 Type 2 diabetes mellitus with hyperglycemia: Secondary | ICD-10-CM | POA: Diagnosis not present

## 2023-02-18 DIAGNOSIS — Z941 Heart transplant status: Secondary | ICD-10-CM | POA: Diagnosis not present

## 2023-02-19 DIAGNOSIS — Z941 Heart transplant status: Secondary | ICD-10-CM | POA: Diagnosis not present

## 2023-02-19 DIAGNOSIS — Z794 Long term (current) use of insulin: Secondary | ICD-10-CM | POA: Diagnosis not present

## 2023-02-19 DIAGNOSIS — E1165 Type 2 diabetes mellitus with hyperglycemia: Secondary | ICD-10-CM | POA: Diagnosis not present

## 2023-02-19 NOTE — Progress Notes (Signed)
--Patient did not show for appt (still admitted at [facility]) Note left for templating purposes--    Advanced Heart Failure Clinic Note   PCP: Donita Brooks, MD Primary Cardiologist: Dr Diona Browner HF Cardiologist: Dr Gala Romney [name]: Dr Edwena Blow  HPI: [name] is a 52 y.o. male with history of NICM (diagnosed 10/2010 - norm cors 2012 and 9/18), chronic systolic heart failure EF 15% (08/2013), S/P ICD [employer], smoker, Wilms tumor s/p nephrectomy at age 89 had chemoradiation, scoliosis.    We saw him at the end of March 2015 and had large recurrent R pleural effusion. Referred to Dr. Donata Clay who placed Pleurex catheter on 4/10, removed on 09/28/13.   CPX (5/15) with RER 1.06, VO2 max 18.5, VE/VCO2 slope 32.1.  Moderate to severe functional limitation, circulatory.  CPX 09/17/16 Peak VO2: 20.5 (53% predicted peak VO2) Slope 35 Underwent L/RHC on 02/12/17. Had normal coronaries and relatively well-compensated hemodynamics. EF 20%. Echo 6/20 EF 15% RV mild HK with severe TR Personally reviewed CPX 8/20 with pVO2 17.1 (down from 20.5)  Slope 35 (stable).  Echo 05/22/22: EF <15%, mild RV systolic dysfunction mild to mod MR, mod TR, RVSP 51 mmHg RHC 05/24/22: RA 7 PA 47/24 (32) W 24 CI 2.5 PVR 1.8 hepatic wedge 10   Seen by GI for cirrhosis. CT scan in 2/24 which showed stable appearing cirrhotic liver with no mass lesions. Found to have benign left adrenal adenoma. Urine studies ruled out pheochromocytoma.   Underwent cardiac transplant at [facility]  02/05/23   SH: Lives alone. Unemployed. On disability. Does not drink alcohol.  FH: Grandfather MI  ROS: All systems negative except as listed in HPI, PMH and Problem List.  Past Medical History:  Diagnosis Date   AICD (automatic cardioverter/defibrillator) present    Arthritis    Cardiomyopathy, nonischemic (HCC)    takes Digoxin daily and Carvedilol daily   CHF (congestive heart failure) (HCC)    takes Lasix daily as well Aldactone    Chronic back pain    Chronic systolic heart failure (HCC)    Cirrhosis (HCC)    Depression    takes Prozac daily   GERD (gastroesophageal reflux disease)    takes Omeprazole daily   Hx of Alcohol consumption heavy    rare beer currently   hx of Tobacco abuse    quit smoking 2014   Hyperlipidemia    was on Simvastatin but has been off a year   Insomnia    takes Ambien as needed(just got script yesterday)   Orthostatic hypotension    Scoliosis    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 15 (XRT and chemo)   Current Outpatient Medications  Medication Sig Dispense Refill   aspirin 81 MG tablet Take 81 mg by mouth daily.     atorvastatin (LIPITOR) 20 MG tablet Take 1 tablet (20 mg total) by mouth daily. 90 tablet 3   colchicine 0.6 MG tablet Take 0.6 mg by mouth as needed (gout flare).     loratadine (CLARITIN) 10 MG tablet Take 10 mg by mouth daily as needed for rhinitis.     metolazone (ZAROXOLYN) 2.5 MG tablet Take 2.5 mg by mouth daily as needed (as orderd by the doctor).     mexiletine (MEXITIL) 200 MG capsule Take 1 capsule (200 mg total) by mouth every 12 (twelve) hours. 60 capsule 3   milrinone (PRIMACOR) 20 MG/100 ML SOLN infusion Inject 0.018 mg/min into the vein continuous.     oxyCODONE (  OXY IR/ROXICODONE) 5 MG immediate release tablet Take 1 tablet (5 mg total) by mouth 3 (three) times daily as needed for severe pain. 60 tablet 0   Potassium Chloride ER 20 MEQ TBCR Take 40 mEq by mouth as needed. When he takes metolazone     torsemide (DEMADEX) 20 MG tablet Take 3 tablets daily 150 tablet 3   No current facility-administered medications for this encounter.   There were no vitals taken for this visit.  Wt Readings from Last 3 Encounters:  01/22/23 70 kg (154 lb 4 oz)  01/18/23 72 kg (158 lb 11.7 oz)  01/17/23 71.8 kg (158 lb 4.6 oz)   PHYSICAL EXAM: General:  Well appearing. No resp difficulty HEENT: normal Neck: supple. no JVD. Carotids 2+ bilat; no bruits. No  lymphadenopathy or thryomegaly appreciated. Cor: PMI laterally displaced. Regular rate & rhythm. 2/6 MR/TR  Lungs: clear Abdomen: soft, nontender, very mildly distended. No hepatosplenomegaly. No bruits or masses. Good bowel sounds. Extremities: no cyanosis, clubbing, rash, edema Neuro: alert & orientedx3, cranial nerves grossly intact. moves all 4 extremities w/o difficulty. Affect pleasant   ASSESSMENT & PLAN:  1. Chronic Systolic HF:   - Nonischemic cardiomyopathy - Echo 10/15 EF 10%.  - Cath 9/18: normal cors. Relatively well-compensated hemodynmaics - Echo (12/[facility] at [facility]): EF <15%, mild RV systolic dysfunction mild to mod MR, mod TR, RVSP 51 mmHg - Underwent cardiac transplant at [facility]  02/05/23 - Improved NYHA II, volume OK today.  2. Cirrhosis - Due to RV failure/ETOH  3. CKD 3a - in setting of solitary kidney due to Wilms tumor  Arvilla Meres, MD  9:49 PM

## 2023-02-20 ENCOUNTER — Inpatient Hospital Stay (HOSPITAL_COMMUNITY)
Admission: RE | Admit: 2023-02-20 | Discharge: 2023-02-20 | Disposition: A | Discharge status: Home / Self Care | Payer: Medicare HMO | Source: Ambulatory Visit | Attending: Internal Medicine | Admitting: Internal Medicine

## 2023-02-20 DIAGNOSIS — I898 Other specified noninfective disorders of lymphatic vessels and lymph nodes: Secondary | ICD-10-CM | POA: Diagnosis not present

## 2023-02-20 DIAGNOSIS — Z941 Heart transplant status: Secondary | ICD-10-CM | POA: Diagnosis not present

## 2023-02-20 DIAGNOSIS — Z7682 Awaiting organ transplant status: Secondary | ICD-10-CM | POA: Diagnosis not present

## 2023-02-20 DIAGNOSIS — Z452 Encounter for adjustment and management of vascular access device: Secondary | ICD-10-CM | POA: Diagnosis not present

## 2023-02-20 DIAGNOSIS — J90 Pleural effusion, not elsewhere classified: Secondary | ICD-10-CM | POA: Diagnosis not present

## 2023-02-20 DIAGNOSIS — Z4682 Encounter for fitting and adjustment of non-vascular catheter: Secondary | ICD-10-CM | POA: Diagnosis not present

## 2023-02-21 ENCOUNTER — Ambulatory Visit: Payer: Medicaid Other | Admitting: "Endocrinology

## 2023-02-21 DIAGNOSIS — I5189 Other ill-defined heart diseases: Secondary | ICD-10-CM | POA: Diagnosis not present

## 2023-02-21 DIAGNOSIS — Z794 Long term (current) use of insulin: Secondary | ICD-10-CM | POA: Diagnosis not present

## 2023-02-21 DIAGNOSIS — Z452 Encounter for adjustment and management of vascular access device: Secondary | ICD-10-CM | POA: Diagnosis not present

## 2023-02-21 DIAGNOSIS — Z4821 Encounter for aftercare following heart transplant: Secondary | ICD-10-CM | POA: Diagnosis not present

## 2023-02-21 DIAGNOSIS — Z941 Heart transplant status: Secondary | ICD-10-CM | POA: Diagnosis not present

## 2023-02-21 DIAGNOSIS — I5022 Chronic systolic (congestive) heart failure: Secondary | ICD-10-CM | POA: Diagnosis not present

## 2023-02-21 DIAGNOSIS — T86298 Other complications of heart transplant: Secondary | ICD-10-CM | POA: Diagnosis not present

## 2023-02-21 DIAGNOSIS — J9811 Atelectasis: Secondary | ICD-10-CM | POA: Diagnosis not present

## 2023-02-21 DIAGNOSIS — E119 Type 2 diabetes mellitus without complications: Secondary | ICD-10-CM | POA: Diagnosis not present

## 2023-02-21 DIAGNOSIS — J90 Pleural effusion, not elsewhere classified: Secondary | ICD-10-CM | POA: Diagnosis not present

## 2023-02-21 DIAGNOSIS — Z4682 Encounter for fitting and adjustment of non-vascular catheter: Secondary | ICD-10-CM | POA: Diagnosis not present

## 2023-02-22 DIAGNOSIS — Z941 Heart transplant status: Secondary | ICD-10-CM | POA: Diagnosis not present

## 2023-02-23 DIAGNOSIS — I899 Noninfective disorder of lymphatic vessels and lymph nodes, unspecified: Secondary | ICD-10-CM | POA: Diagnosis not present

## 2023-02-23 DIAGNOSIS — Z941 Heart transplant status: Secondary | ICD-10-CM | POA: Diagnosis not present

## 2023-03-06 DIAGNOSIS — J9811 Atelectasis: Secondary | ICD-10-CM | POA: Diagnosis not present

## 2023-03-06 DIAGNOSIS — Z9889 Other specified postprocedural states: Secondary | ICD-10-CM | POA: Diagnosis not present

## 2023-03-06 DIAGNOSIS — I130 Hypertensive heart and chronic kidney disease with heart failure and stage 1 through stage 4 chronic kidney disease, or unspecified chronic kidney disease: Secondary | ICD-10-CM | POA: Diagnosis not present

## 2023-03-06 DIAGNOSIS — D84821 Immunodeficiency due to drugs: Secondary | ICD-10-CM | POA: Diagnosis not present

## 2023-03-06 DIAGNOSIS — I5022 Chronic systolic (congestive) heart failure: Secondary | ICD-10-CM | POA: Diagnosis not present

## 2023-03-06 DIAGNOSIS — Z2989 Encounter for other specified prophylactic measures: Secondary | ICD-10-CM | POA: Diagnosis not present

## 2023-03-06 DIAGNOSIS — T8621 Heart transplant rejection: Secondary | ICD-10-CM | POA: Diagnosis not present

## 2023-03-06 DIAGNOSIS — Z941 Heart transplant status: Secondary | ICD-10-CM | POA: Diagnosis not present

## 2023-03-06 DIAGNOSIS — J940 Chylous effusion: Secondary | ICD-10-CM | POA: Diagnosis not present

## 2023-03-06 DIAGNOSIS — J90 Pleural effusion, not elsewhere classified: Secondary | ICD-10-CM | POA: Diagnosis not present

## 2023-03-06 DIAGNOSIS — I3139 Other pericardial effusion (noninflammatory): Secondary | ICD-10-CM | POA: Diagnosis not present

## 2023-03-06 DIAGNOSIS — Z48298 Encounter for aftercare following other organ transplant: Secondary | ICD-10-CM | POA: Diagnosis not present

## 2023-03-06 DIAGNOSIS — Z79899 Other long term (current) drug therapy: Secondary | ICD-10-CM | POA: Diagnosis not present

## 2023-03-06 DIAGNOSIS — N183 Chronic kidney disease, stage 3 unspecified: Secondary | ICD-10-CM | POA: Diagnosis not present

## 2023-03-11 ENCOUNTER — Encounter: Payer: Self-pay | Admitting: Family Medicine

## 2023-03-11 ENCOUNTER — Ambulatory Visit (INDEPENDENT_AMBULATORY_CARE_PROVIDER_SITE_OTHER): Payer: Medicare HMO | Admitting: Family Medicine

## 2023-03-11 VITALS — BP 132/72 | HR 95 | Temp 97.6°F | Ht 68.0 in | Wt 150.2 lb

## 2023-03-11 DIAGNOSIS — Z941 Heart transplant status: Secondary | ICD-10-CM | POA: Diagnosis not present

## 2023-03-11 DIAGNOSIS — E1169 Type 2 diabetes mellitus with other specified complication: Secondary | ICD-10-CM

## 2023-03-11 DIAGNOSIS — I5022 Chronic systolic (congestive) heart failure: Secondary | ICD-10-CM

## 2023-03-11 DIAGNOSIS — D849 Immunodeficiency, unspecified: Secondary | ICD-10-CM | POA: Diagnosis not present

## 2023-03-11 MED ORDER — TRAZODONE HCL 50 MG PO TABS
25.0000 mg | ORAL_TABLET | Freq: Every evening | ORAL | 3 refills | Status: DC | PRN
Start: 2023-03-11 — End: 2023-06-14

## 2023-03-11 MED ORDER — OXYCODONE HCL 5 MG PO TABS: 5.0000 mg | ORAL_TABLET | Freq: Three times a day (TID) | ORAL | 0 refills | Status: DC | PRN

## 2023-03-11 NOTE — Progress Notes (Signed)
Subjective:    Patient ID: [name], male    DOB: 1970/10/19, 52 y.o.   MRN: [id number]  Patient is a 52 year old Caucasian gentleman with a history of nonischemic cardiomyopathy/congestive heart failure as well as an AICD placement.  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  He was recently admitted to [facility] for a heart transplant.  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  He is currently on octreotide infusions to resolve this.  This is being managed by the transplant team.  He has an appointment to see them on Wednesday for lab work and a recheck.  He is also on atovaquone for PCP and toxoplasmosis prophylaxis.  He is also on Valcyte for CMV prophylaxis.  Immunosuppression includes prednisone, mycophenolate, and tacrolimus.  As result of his immunosuppression, the patient is dealing with hyperglycemia.  Transplant team at [facility] started him on metformin as well as glipizide.  He has insulin with a sliding scale.  He is checking his sugars in the morning.  Sugars are typically 100-110.  He denies any blood sugars greater than 200.  He denies any hypoglycemic episodes.  He is not having to use his insulin.  He denies any chest pain shortness of breath.  He denies any fevers or chills.  There is no evidence of a DVT in either leg.  His lungs are completely clear.  He denies any dysuria or urgency or frequency. Past Medical History:  Diagnosis Date   AICD (automatic cardioverter/defibrillator) present    Arthritis    Cardiomyopathy, nonischemic (HCC)    takes Digoxin daily and Carvedilol daily   CHF (congestive heart failure) (HCC)    takes Lasix daily as well Aldactone   Chronic back pain    Chronic systolic heart failure (HCC)    Cirrhosis (HCC)    Depression    takes Prozac daily   GERD (gastroesophageal reflux disease)    takes Omeprazole daily   Hx of Alcohol consumption heavy    rare beer currently   hx of  Tobacco abuse    quit smoking 2014   Hyperlipidemia    was on Simvastatin but has been off a year   Insomnia    takes Ambien as needed(just got script yesterday)   Orthostatic hypotension    Scoliosis    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 12 (XRT and chemo)   Past Surgical History:  Procedure Laterality Date   BIOPSY  10/31/2022   Procedure: BIOPSY;  Surgeon: Benancio Deeds, MD;  Location[facility] ENDOSCOPY;  Service: Gastroenterology;;   Fidela Salisbury RELEASE Right 01/09/2013   Procedure: CARPAL TUNNEL RELEASE;  Surgeon: Carmela Hurt, MD;  Location: [facility] NEURO ORS;  Service: Neurosurgery;  Laterality: Right;  RIGHT carpal tunnel release   CARPAL TUNNEL RELEASE Left 01/30/2013   Procedure: LEFT CARPAL TUNNEL RELEASE;  Surgeon: Carmela Hurt, MD;  Location: [facility] NEURO ORS;  Service: Neurosurgery;  Laterality: Left;  LEFT Carpal Tunnel release   CHEST TUBE INSERTION Right 09/09/2013   Procedure: INSERTION PLEURAL DRAINAGE CATHETER;  Surgeon: Kerin Perna, MD;  Location: [facility] OR;  Service: Thoracic;  Laterality: Right;   COLONOSCOPY WITH PROPOFOL N/A 10/31/2022   Procedure: COLONOSCOPY WITH PROPOFOL;  Surgeon: Benancio Deeds, MD;  Location: [facility] ENDOSCOPY;  Service: Gastroenterology;  Laterality: N/A;   HYDROCELE EXCISION Right 04/11/2017   Procedure: RIGHT HYDROCELECTOMY ADULT;  Surgeon: Bjorn Pippin, MD;  Location: [facility] ORS;  Service: Urology;  Laterality: Right;   hydrocelectomy  2008   ICD  05/25/2011   [facility] [employer] SG lead/[employer]nergen single chamber device   IMPLANTABLE CARDIOVERTER DEFIBRILLATOR IMPLANT N/A 05/25/2011   Procedure: IMPLANTABLE CARDIOVERTER DEFIBRILLATOR IMPLANT;  Surgeon: Marinus Maw, MD;  Location: [facility] CATH LAB;  Service: Cardiovascular;  Laterality: N/A;   IR FLUORO GUIDE CV LINE RIGHT  01/16/2023   IR US GUIDE VASC ACCESS RIGHT  01/16/2023   NEPHRECTOMY  36 yrs ago   Wilms Tumor   POLYPECTOMY  10/31/2022   Procedure: POLYPECTOMY;  Surgeon: Benancio Deeds, MD;  Location: [facility] ENDOSCOPY;  Service: Gastroenterology;;   RIGHT HEART CATH N/A 01/29/2019   Procedure: RIGHT HEART CATH;  Surgeon: Dolores Patty, MD;  Location: [facility] INVASIVE CV LAB;  Service: Cardiovascular;  Laterality: N/A;   RIGHT HEART CATH N/A 09/08/2019   Procedure: RIGHT HEART CATH;  Surgeon: Dolores Patty, MD;  Location: [facility] INVASIVE CV LAB;  Service: Cardiovascular;  Laterality: N/A;   RIGHT HEART CATH N/A 01/15/2023   Procedure: RIGHT HEART CATH;  Surgeon: Dolores Patty, MD;  Location: [facility] INVASIVE CV LAB;  Service: Cardiovascular;  Laterality: N/A;   RIGHT/LEFT HEART CATH AND CORONARY ANGIOGRAPHY N/A 02/12/2017   Procedure: RIGHT/LEFT HEART CATH AND CORONARY ANGIOGRAPHY;  Surgeon: Dolores Patty, MD;  Location: [facility] INVASIVE CV LAB;  Service: Cardiovascular;  Laterality: N/A;   ULNAR NERVE TRANSPOSITION Right 01/09/2013   Procedure: ULNAR NERVE DECOMPRESSION/TRANSPOSITION;  Surgeon: Carmela Hurt, MD;  Location: [facility] NEURO ORS;  Service: Neurosurgery;  Laterality: Right;  RIGHT ulnar nerve decompression   Current Outpatient Medications on File Prior to Visit  Medication Sig Dispense Refill   aspirin 81 MG tablet Take 81 mg by mouth daily.     atovaquone (MEPRON) 750 MG/5ML suspension Take 750 mg by mouth daily.     colchicine 0.6 MG tablet Take 0.6 mg by mouth as needed (gout flare).     furosemide (LASIX) 20 MG tablet Take 40 mg by mouth.     glipiZIDE (GLUCOTROL XL) 10 MG 24 hr tablet Take 10 mg by mouth daily with breakfast.     metFORMIN (GLUCOPHAGE-XR) 500 MG 24 hr tablet Take 1,000 mg by mouth daily with breakfast.     mycophenolate (CELLCEPT) 250 MG capsule Take 250 mg by mouth 2 (two) times daily.     octreotide (SANDOSTATIN) 100 MCG/ML SOLN injection Inject 100 mcg into the skin every 12 (twelve) hours.     predniSONE (DELTASONE) 5 MG tablet Take 15 mg by mouth daily with breakfast.     rosuvastatin (CRESTOR) 10 MG tablet Take 10 mg by mouth daily.      tacrolimus (PROGRAF) 1 MG capsule Take 2 mg by mouth 2 (two) times daily.     valGANciclovir (VALCYTE) 450 MG tablet Take 450 mg by mouth daily.     No current facility-administered medications on file prior to visit.   Allergies  Allergen Reactions   Ibuprofen Hives   Nsaids     ELEVATED LFT'S   Social History   Socioeconomic History   Marital status: Single    Spouse name: Not on file   Number of children: 1   Years of education: Not on file   Highest education level: Not on file  Occupational History   Occupation: Full time    Comment: Engineer, petroleum  Tobacco Use   Smoking status: Former    Current packs/day: 0.00    Average  packs/day: 0.3 packs/day for 20.0 years (5.0 ttl pk-yrs)    Types: Cigarettes    Start date: 09/07/1992    Quit date: 09/07/2012    Years since quitting: 10.5   Smokeless tobacco: Current    Types: Snuff  Vaping Use   Vaping status: Never Used  Substance and Sexual Activity   Alcohol use: Yes    Alcohol/week: 0.0 standard drinks of alcohol    Comment: histoorically a heavy drinker ("beer only"), none for a couple months    Drug use: No   Sexual activity: Not Currently  Other Topics Concern   Not on file  Social History Narrative   Not on file   Social Determinants of Health   Financial Resource Strain: Low Risk  (07/13/2022)   Overall Financial Resource Strain (CARDIA)    Difficulty of Paying Living Expenses: Not hard at all  Food Insecurity: No Food Insecurity (01/22/2023)   Hunger Vital Sign    Worried About Running Out of Food in the Last Year: Never true    Ran Out of Food in the Last Year: Never true  Transportation Needs: No Transportation Needs (02/17/2023)   Received from [facility]   PRAPARE - Transportation    In the past 12 months, has lack of transportation kept you from medical appointments or from getting medications?: No    Lack of Transportation (Non-Medical): No  Physical Activity: Inactive (07/13/2022)    Exercise Vital Sign    Days of Exercise per Week: 0 days    Minutes of Exercise per Session: 0 min  Stress: No Stress Concern Present (07/13/2022)   [employer] of Occupational Health - Occupational Stress Questionnaire    Feeling of Stress : Not at all  Social Connections: Moderately Isolated (07/13/2022)   Social Connection and Isolation Panel [NHANES]    Frequency of Communication with Friends and Family: Three times a week    Frequency of Social Gatherings with Friends and Family: Three times a week    Attends Religious Services: Never    Active Member of Clubs or Organizations: Yes    Attends Banker Meetings: Never    Marital Status: Never married  Intimate Partner Violence: Not At Risk (01/15/2023)   Humiliation, Afraid, Rape, and Kick questionnaire    Fear of Current or Ex-Partner: No    Emotionally Abused: No    Physically Abused: No    Sexually Abused: No     Review of Systems  All other systems reviewed and are negative.      Objective:   Physical Exam Vitals reviewed.  Constitutional:      General: He is not in acute distress.    Appearance: Normal appearance. He is normal weight. He is not ill-appearing or toxic-appearing.  Cardiovascular:     Rate and Rhythm: Normal rate and regular rhythm.     Heart sounds: Normal heart sounds. No murmur heard.    No friction rub. No gallop.  Pulmonary:     Effort: Pulmonary effort is normal. No respiratory distress.     Breath sounds: Normal breath sounds. No stridor. No wheezing, rhonchi or rales.  Chest:    Abdominal:     General: Abdomen is flat. Bowel sounds are normal.     Palpations: Abdomen is soft.  Musculoskeletal:        General: No swelling or tenderness.     Right lower leg: No edema.     Left lower leg: No edema.  Skin:  Findings: No erythema.  Neurological:     General: No focal deficit present.     Mental Status: He is alert and oriented to person, place, and time. Mental status is  at baseline.     Cranial Nerves: No cranial nerve deficit.     Motor: No weakness.     Gait: Gait normal.          Assessment & Plan:  Type 2 diabetes mellitus with other specified complication, without long-term current use of insulin (HCC)  Chronic systolic congestive heart failure (HCC)  Heart transplant recipient (HCC)  Immunocompromised (HCC) With regards to his congestive heart failure, he is essentially cured having received a heart transplant.  He is using Lasix as needed for swelling.  His renal function was checked less than 1 week ago at [facility] and he has an appointment to see them on Wednesday to repeat blood work.  Therefore I will defer to the transplant team at [facility] to avoid unnecessary lab test to monitor his potassium and his renal function.  His lungs are completely clear to auscultation bilaterally today and he denies any chest pain or shortness of breath.  His biggest issue at this point is his immunocompromise status as well as possible rejection of the transplant.  This is being managed by the transplant team.  He is on immunosuppression including prednisone.  This is causing his sugars to be elevated.  He is checking his sugar on a daily basis.  I advised the patient to discontinue glipizide if his blood sugar drops below 100 to avoid hypoglycemia.  I recommended a flu shot and a COVID shot if cleared by the transplant team.  He will check with them on Wednesday.  He is on PCP/toxo prophylaxis as well as CMV prophylaxis.

## 2023-03-13 DIAGNOSIS — M40205 Unspecified kyphosis, thoracolumbar region: Secondary | ICD-10-CM | POA: Diagnosis not present

## 2023-03-13 DIAGNOSIS — I3139 Other pericardial effusion (noninflammatory): Secondary | ICD-10-CM | POA: Diagnosis not present

## 2023-03-13 DIAGNOSIS — Z4821 Encounter for aftercare following heart transplant: Secondary | ICD-10-CM | POA: Diagnosis not present

## 2023-03-13 DIAGNOSIS — Z941 Heart transplant status: Secondary | ICD-10-CM | POA: Diagnosis not present

## 2023-03-13 DIAGNOSIS — R9431 Abnormal electrocardiogram [ECG] [EKG]: Secondary | ICD-10-CM | POA: Diagnosis not present

## 2023-03-13 DIAGNOSIS — R000 Tachycardia, unspecified: Secondary | ICD-10-CM | POA: Diagnosis not present

## 2023-03-13 DIAGNOSIS — J90 Pleural effusion, not elsewhere classified: Secondary | ICD-10-CM | POA: Diagnosis not present

## 2023-03-15 DIAGNOSIS — Z794 Long term (current) use of insulin: Secondary | ICD-10-CM | POA: Diagnosis not present

## 2023-03-15 DIAGNOSIS — Z87891 Personal history of nicotine dependence: Secondary | ICD-10-CM | POA: Diagnosis not present

## 2023-03-15 DIAGNOSIS — E1065 Type 1 diabetes mellitus with hyperglycemia: Secondary | ICD-10-CM | POA: Diagnosis not present

## 2023-03-15 DIAGNOSIS — Z23 Encounter for immunization: Secondary | ICD-10-CM | POA: Diagnosis not present

## 2023-03-20 DIAGNOSIS — Z2989 Encounter for other specified prophylactic measures: Secondary | ICD-10-CM | POA: Diagnosis not present

## 2023-03-20 DIAGNOSIS — I361 Nonrheumatic tricuspid (valve) insufficiency: Secondary | ICD-10-CM | POA: Diagnosis not present

## 2023-03-20 DIAGNOSIS — Z941 Heart transplant status: Secondary | ICD-10-CM | POA: Diagnosis not present

## 2023-03-20 DIAGNOSIS — Z48298 Encounter for aftercare following other organ transplant: Secondary | ICD-10-CM | POA: Diagnosis not present

## 2023-03-20 DIAGNOSIS — I10 Essential (primary) hypertension: Secondary | ICD-10-CM | POA: Diagnosis not present

## 2023-03-20 DIAGNOSIS — Z79899 Other long term (current) drug therapy: Secondary | ICD-10-CM | POA: Diagnosis not present

## 2023-03-20 DIAGNOSIS — K769 Liver disease, unspecified: Secondary | ICD-10-CM | POA: Diagnosis not present

## 2023-03-20 DIAGNOSIS — D84821 Immunodeficiency due to drugs: Secondary | ICD-10-CM | POA: Diagnosis not present

## 2023-03-20 DIAGNOSIS — I3139 Other pericardial effusion (noninflammatory): Secondary | ICD-10-CM | POA: Diagnosis not present

## 2023-03-20 DIAGNOSIS — Z4821 Encounter for aftercare following heart transplant: Secondary | ICD-10-CM | POA: Diagnosis not present

## 2023-03-20 DIAGNOSIS — E876 Hypokalemia: Secondary | ICD-10-CM | POA: Diagnosis not present

## 2023-03-25 DIAGNOSIS — Z941 Heart transplant status: Secondary | ICD-10-CM | POA: Diagnosis not present

## 2023-04-03 DIAGNOSIS — I3139 Other pericardial effusion (noninflammatory): Secondary | ICD-10-CM | POA: Diagnosis not present

## 2023-04-03 DIAGNOSIS — D849 Immunodeficiency, unspecified: Secondary | ICD-10-CM | POA: Diagnosis not present

## 2023-04-03 DIAGNOSIS — F1091 Alcohol use, unspecified, in remission: Secondary | ICD-10-CM | POA: Diagnosis not present

## 2023-04-03 DIAGNOSIS — I1310 Hypertensive heart and chronic kidney disease without heart failure, with stage 1 through stage 4 chronic kidney disease, or unspecified chronic kidney disease: Secondary | ICD-10-CM | POA: Diagnosis not present

## 2023-04-03 DIAGNOSIS — K769 Liver disease, unspecified: Secondary | ICD-10-CM | POA: Diagnosis not present

## 2023-04-03 DIAGNOSIS — Z2989 Encounter for other specified prophylactic measures: Secondary | ICD-10-CM | POA: Diagnosis not present

## 2023-04-03 DIAGNOSIS — N183 Chronic kidney disease, stage 3 unspecified: Secondary | ICD-10-CM | POA: Diagnosis not present

## 2023-04-03 DIAGNOSIS — Z4821 Encounter for aftercare following heart transplant: Secondary | ICD-10-CM | POA: Diagnosis not present

## 2023-04-03 DIAGNOSIS — I071 Rheumatic tricuspid insufficiency: Secondary | ICD-10-CM | POA: Diagnosis not present

## 2023-04-03 DIAGNOSIS — Z941 Heart transplant status: Secondary | ICD-10-CM | POA: Diagnosis not present

## 2023-04-03 DIAGNOSIS — I10 Essential (primary) hypertension: Secondary | ICD-10-CM | POA: Diagnosis not present

## 2023-04-03 DIAGNOSIS — Z85528 Personal history of other malignant neoplasm of kidney: Secondary | ICD-10-CM | POA: Diagnosis not present

## 2023-04-03 DIAGNOSIS — Z79899 Other long term (current) drug therapy: Secondary | ICD-10-CM | POA: Diagnosis not present

## 2023-04-03 DIAGNOSIS — Z48298 Encounter for aftercare following other organ transplant: Secondary | ICD-10-CM | POA: Diagnosis not present

## 2023-04-03 DIAGNOSIS — E785 Hyperlipidemia, unspecified: Secondary | ICD-10-CM | POA: Diagnosis not present

## 2023-04-05 ENCOUNTER — Other Ambulatory Visit: Payer: Self-pay

## 2023-04-05 MED ORDER — OXYCODONE HCL 5 MG PO TABS: 5.0000 mg | ORAL_TABLET | Freq: Three times a day (TID) | ORAL | 0 refills | Status: DC | PRN

## 2023-04-05 NOTE — Telephone Encounter (Signed)
Pt called in to request a refill of this med please:   oxyCODONE (OXY IR/ROXICODONE) 5 MG immediate release tablet [[id number]]  LOV: 06/15/22  PHARMACY: [facility] [address], [place] - [address] [place], [place] [address] Phone: [phone]  Fax: [phone]  CB#: [phone]

## 2023-05-01 DIAGNOSIS — K769 Liver disease, unspecified: Secondary | ICD-10-CM | POA: Diagnosis not present

## 2023-05-01 DIAGNOSIS — Z4821 Encounter for aftercare following heart transplant: Secondary | ICD-10-CM | POA: Diagnosis not present

## 2023-05-01 DIAGNOSIS — Z941 Heart transplant status: Secondary | ICD-10-CM | POA: Diagnosis not present

## 2023-05-01 DIAGNOSIS — D849 Immunodeficiency, unspecified: Secondary | ICD-10-CM | POA: Diagnosis not present

## 2023-05-01 DIAGNOSIS — R739 Hyperglycemia, unspecified: Secondary | ICD-10-CM | POA: Diagnosis not present

## 2023-05-01 DIAGNOSIS — D84821 Immunodeficiency due to drugs: Secondary | ICD-10-CM | POA: Diagnosis not present

## 2023-05-01 DIAGNOSIS — Z48298 Encounter for aftercare following other organ transplant: Secondary | ICD-10-CM | POA: Diagnosis not present

## 2023-05-01 DIAGNOSIS — H538 Other visual disturbances: Secondary | ICD-10-CM | POA: Diagnosis not present

## 2023-05-01 DIAGNOSIS — E1165 Type 2 diabetes mellitus with hyperglycemia: Secondary | ICD-10-CM | POA: Diagnosis not present

## 2023-05-01 DIAGNOSIS — T380X5A Adverse effect of glucocorticoids and synthetic analogues, initial encounter: Secondary | ICD-10-CM | POA: Diagnosis not present

## 2023-05-01 DIAGNOSIS — I10 Essential (primary) hypertension: Secondary | ICD-10-CM | POA: Diagnosis not present

## 2023-05-01 DIAGNOSIS — Z79899 Other long term (current) drug therapy: Secondary | ICD-10-CM | POA: Diagnosis not present

## 2023-05-01 DIAGNOSIS — I3139 Other pericardial effusion (noninflammatory): Secondary | ICD-10-CM | POA: Diagnosis not present

## 2023-05-01 DIAGNOSIS — R3589 Other polyuria: Secondary | ICD-10-CM | POA: Diagnosis not present

## 2023-05-06 ENCOUNTER — Telehealth: Payer: Self-pay

## 2023-05-06 ENCOUNTER — Other Ambulatory Visit: Payer: Self-pay | Admitting: Family Medicine

## 2023-05-06 MED ORDER — OXYCODONE HCL 5 MG PO TABS: 5.0000 mg | ORAL_TABLET | Freq: Three times a day (TID) | ORAL | 0 refills | Status: DC | PRN

## 2023-05-06 NOTE — Telephone Encounter (Signed)
Copied from CRM [id number]. Topic: Clinical - Medication Refill >> May 06, 2023  8:57 AM Fonda Kinder J wrote: Most Recent Primary Care Visit:  Provider: Lynnea Ferrier T  Department: [facility]  Visit Type: HOSPITAL FU  Date: 03/11/2023  Medication: Patient is asking for a refill on oxyCododne  Has the patient contacted their pharmacy? Yes (Agent: If no, request that the patient contact the pharmacy for the refill. If patient does not wish to contact the pharmacy document the reason why and proceed with request.) (Agent: If yes, when and what did the pharmacy advise?)  Is this the correct pharmacy for this prescription? Yes If no, delete pharmacy and type the correct one.  This is the patient's preferred pharmacy:  [facility] [address], [place] - [address] [place] [address] Phone: [phone] Fax: [phone]  [facility] [address] [place] [address] Phone: [phone] Fax: [phone]   Has the prescription been filled recently? Yes  Is the patient out of the medication? Yes  Has the patient been seen for an appointment in the last year OR does the patient have an upcoming appointment? Yes  Can we respond through MyChart? No  Agent: Please be advised that Rx refills may take up to 3 business days. We ask that you follow-up with your pharmacy.

## 2023-05-06 NOTE — Telephone Encounter (Signed)
Copied from CRM [id number]. Topic: Clinical - Medication Refill >> May 06, 2023  8:57 AM Fonda Kinder J wrote: Most Recent Primary Care Visit:  Provider: Lynnea Ferrier T  Department: [facility]  Visit Type: HOSPITAL FU  Date: 03/11/2023  Medication: Patient is asking for a refill on oxyCododne  Has the patient contacted their pharmacy? Yes (Agent: If no, request that the patient contact the pharmacy for the refill. If patient does not wish to contact the pharmacy document the reason why and proceed with request.) (Agent: If yes, when and what did the pharmacy advise?)  Is this the correct pharmacy for this prescription? Yes If no, delete pharmacy and type the correct one.  This is the patient's preferred pharmacy:  [facility] [address] [place] - [address] [place] [address] Phone: [phone] Fax: [phone]  [facility] [address] [place] [address] Phone: [phone] Fax: [phone]   Has the prescription been filled recently? Yes  Is the patient out of the medication? Yes  Has the patient been seen for an appointment in the last year OR does the patient have an upcoming appointment? Yes  Can we respond through MyChart? No  Agent: Please be advised that Rx refills may take up to 3 business days. We ask that you follow-up with your pharmacy.

## 2023-05-16 DIAGNOSIS — R0602 Shortness of breath: Secondary | ICD-10-CM | POA: Diagnosis not present

## 2023-05-16 DIAGNOSIS — Z941 Heart transplant status: Secondary | ICD-10-CM | POA: Diagnosis not present

## 2023-05-16 DIAGNOSIS — T8621 Heart transplant rejection: Secondary | ICD-10-CM | POA: Diagnosis not present

## 2023-05-16 DIAGNOSIS — I428 Other cardiomyopathies: Secondary | ICD-10-CM | POA: Diagnosis not present

## 2023-05-16 DIAGNOSIS — I10 Essential (primary) hypertension: Secondary | ICD-10-CM | POA: Diagnosis not present

## 2023-05-16 DIAGNOSIS — R931 Abnormal findings on diagnostic imaging of heart and coronary circulation: Secondary | ICD-10-CM | POA: Diagnosis not present

## 2023-05-16 DIAGNOSIS — I3139 Other pericardial effusion (noninflammatory): Secondary | ICD-10-CM | POA: Diagnosis not present

## 2023-05-16 DIAGNOSIS — H538 Other visual disturbances: Secondary | ICD-10-CM | POA: Diagnosis not present

## 2023-05-16 DIAGNOSIS — N183 Chronic kidney disease, stage 3 unspecified: Secondary | ICD-10-CM | POA: Diagnosis not present

## 2023-05-16 DIAGNOSIS — D84821 Immunodeficiency due to drugs: Secondary | ICD-10-CM | POA: Diagnosis not present

## 2023-05-16 DIAGNOSIS — E119 Type 2 diabetes mellitus without complications: Secondary | ICD-10-CM | POA: Diagnosis not present

## 2023-05-16 DIAGNOSIS — N189 Chronic kidney disease, unspecified: Secondary | ICD-10-CM | POA: Diagnosis not present

## 2023-05-16 DIAGNOSIS — I129 Hypertensive chronic kidney disease with stage 1 through stage 4 chronic kidney disease, or unspecified chronic kidney disease: Secondary | ICD-10-CM | POA: Diagnosis not present

## 2023-05-16 DIAGNOSIS — E875 Hyperkalemia: Secondary | ICD-10-CM | POA: Diagnosis not present

## 2023-05-16 DIAGNOSIS — T86298 Other complications of heart transplant: Secondary | ICD-10-CM | POA: Diagnosis not present

## 2023-05-16 DIAGNOSIS — J90 Pleural effusion, not elsewhere classified: Secondary | ICD-10-CM | POA: Diagnosis not present

## 2023-05-16 DIAGNOSIS — E1122 Type 2 diabetes mellitus with diabetic chronic kidney disease: Secondary | ICD-10-CM | POA: Diagnosis not present

## 2023-05-16 DIAGNOSIS — D849 Immunodeficiency, unspecified: Secondary | ICD-10-CM | POA: Diagnosis not present

## 2023-05-16 DIAGNOSIS — K7460 Unspecified cirrhosis of liver: Secondary | ICD-10-CM | POA: Diagnosis not present

## 2023-05-16 DIAGNOSIS — Z48298 Encounter for aftercare following other organ transplant: Secondary | ICD-10-CM | POA: Diagnosis not present

## 2023-05-20 ENCOUNTER — Telehealth: Payer: Self-pay | Admitting: *Deleted

## 2023-05-20 NOTE — Transitions of Care (Post Inpatient/ED Visit) (Signed)
05/20/2023  Name: [name] MRN: [id number] DOB: August 05, 1970  Today's TOC FU Call Status: Today's TOC FU Call Status:: Successful TOC FU Call Completed TOC FU Call Complete Date: 05/20/23 Patient's Name and Date of Birth confirmed.  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 (HHS-HCC) How have you been since you were released from the hospital?:  (pt states he is doing very well, eating well, having regular bowel movements, states was in hospital for "little fluid around my heart", s/p heart transplant 3 months ago) Any questions or concerns?: No  Items Reviewed: Did you receive and understand the discharge instructions provided?: Yes Medications obtained,verified, and reconciled?: Yes (Medications Reviewed) Any new allergies since your discharge?: No Dietary orders reviewed?: Yes Type of Diet Ordered:: low sodium, carbohydrate modified Do you have support at home?: Yes People in Home: child(ren), adult Name of Support/Comfort Primary Source: adult son lives with pt-  [name]  Medications Reviewed Today: Medications Reviewed Today     Reviewed by Audrie Gallus, RN (Registered Nurse) on 05/20/23 at 1316  Med List Status: <None>   Medication Order Taking? Sig Documenting Provider Last Dose Status Informant  aspirin 81 MG tablet [id number] Yes Take 81 mg by mouth daily. [provider] Taking Active Self           Med Note Abbe Amsterdam, HEATHER L   Mon Aug 24, 2013  1:48 PM)     atovaquone [facility]) 750 MG/5ML suspension [id number] Yes Take 750 mg by mouth daily. [provider] Taking Active   colchicine 0.6 MG tablet [id number] Yes Take 0.6 mg by mouth as needed (gout flare). [provider]  Taking Active Self  furosemide (LASIX) 20 MG tablet [id number] No Take 40 mg by mouth.  Patient not taking: Reported on 05/20/2023   [provider] Not Taking Active   glipiZIDE (GLUCOTROL XL) 10 MG 24 hr tablet [id number] Yes Take 10 mg by mouth daily with breakfast. [provider] Taking Active   metFORMIN (GLUCOPHAGE-XR) 500 MG 24 hr tablet [id number] Yes Take 1,000 mg by mouth daily with breakfast. [provider] Taking Active   mycophenolate (CELLCEPT) 250 MG capsule [id number] Yes Take 250 mg by mouth 2 (two) times daily. [provider] Taking Active   octreotide (SANDOSTATIN) 100 MCG/ML SOLN injection [id number] No Inject 100 mcg into the skin every 12 (twelve) hours.  Patient not taking: Reported on 05/20/2023   [provider] Not Taking Active   oxyCODONE (OXY IR/ROXICODONE) 5 MG immediate release tablet [id number] Yes Take 1 tablet (5 mg total) by mouth 3 (three) times daily as needed for severe pain (pain score 7-10). Richard Brooks, MD Taking Active   predniSONE (DELTASONE) 5 MG tablet [id number] Yes Take 15 mg by mouth daily with breakfast. Pt states takes 10 mg [provider] Taking Active   rosuvastatin (CRESTOR) 10 MG tablet [id number] Yes Take 10 mg by mouth daily. [provider] Taking Active   tacrolimus (PROGRAF) 1 MG capsule [id number] Yes Take 2 mg by mouth 2 (two) times daily. [provider] Taking Active   torsemide (DEMADEX) 20 MG tablet [id number] Yes Take 20 mg by mouth daily. Pt takes 2 tablets q am [provider] Taking Active   traZODone (DESYREL) 50 MG tablet [id number] Yes Take 0.5-1 tablets (25-50 mg  total) by mouth at bedtime as needed for sleep. Richard Brooks, MD Taking Active   valGANciclovir (VALCYTE) 450 MG tablet [id number] Yes Take 450 mg by mouth daily. [provider] Taking Active             Home Care and Equipment/Supplies: Were Home Health Services  Ordered?: No Any new equipment or medical supplies ordered?: No  Functional Questionnaire: Do you need assistance with bathing/showering or dressing?: No Do you need assistance with meal preparation?: No Do you need assistance with eating?: No Do you have difficulty maintaining continence: No Do you need assistance with getting out of bed/getting out of a chair/moving?: No Do you have difficulty managing or taking your medications?: No  Follow up appointments reviewed: PCP Follow-up appointment confirmed?: No (pt states he will follow up with cardiologist at [facility] and feels this is sufficient) MD Provider Line Number:[phone] Given: No Specialist Hospital Follow-up appointment confirmed?: No (pt states TOC completed by [facility] cardiology and states " they check in on me all the time and take very good care of me") Reason Specialist Follow-Up Not Confirmed: Patient has Specialist Provider Number and will Call for Appointment (pt states [facility] heart coordinator will be calling back this afternoon with date/ time of scheduled follow up appt.) Do you need transportation to your follow-up appointment?: No  SDOH Interventions Today    Flowsheet Row Most Recent Value  SDOH Interventions   Food Insecurity Interventions Intervention Not Indicated  Housing Interventions Intervention Not Indicated  Transportation Interventions Intervention Not Indicated  Utilities Interventions Intervention Not Indicated       Richard Shows [facility], BSN RN Care Manager/ Transition of Care [place]/ [facility] Population Health [phone]

## 2023-05-21 DIAGNOSIS — H5203 Hypermetropia, bilateral: Secondary | ICD-10-CM | POA: Diagnosis not present

## 2023-05-21 DIAGNOSIS — Z794 Long term (current) use of insulin: Secondary | ICD-10-CM | POA: Diagnosis not present

## 2023-05-21 DIAGNOSIS — H25813 Combined forms of age-related cataract, bilateral: Secondary | ICD-10-CM | POA: Diagnosis not present

## 2023-05-21 DIAGNOSIS — E119 Type 2 diabetes mellitus without complications: Secondary | ICD-10-CM | POA: Diagnosis not present

## 2023-05-22 DIAGNOSIS — H524 Presbyopia: Secondary | ICD-10-CM | POA: Diagnosis not present

## 2023-05-22 DIAGNOSIS — H52209 Unspecified astigmatism, unspecified eye: Secondary | ICD-10-CM | POA: Diagnosis not present

## 2023-05-22 DIAGNOSIS — H5203 Hypermetropia, bilateral: Secondary | ICD-10-CM | POA: Diagnosis not present

## 2023-05-31 DIAGNOSIS — Z941 Heart transplant status: Secondary | ICD-10-CM | POA: Diagnosis not present

## 2023-05-31 DIAGNOSIS — Z0189 Encounter for other specified special examinations: Secondary | ICD-10-CM | POA: Diagnosis not present

## 2023-06-03 ENCOUNTER — Other Ambulatory Visit: Payer: Self-pay | Admitting: Family Medicine

## 2023-06-03 MED ORDER — OXYCODONE HCL 5 MG PO TABS: 5.0000 mg | ORAL_TABLET | Freq: Three times a day (TID) | ORAL | 0 refills | Status: DC | PRN

## 2023-06-03 NOTE — Telephone Encounter (Signed)
Copied from CRM [id number]. Topic: Clinical - Medication Refill >> Jun 03, 2023  8:59 AM Maxwell Marion wrote: Most Recent Primary Care Visit:  Provider: Lynnea Ferrier T  Department: [facility]  Visit Type: HOSPITAL FU  Date: 03/11/2023  Medication: oxyCODONE (OXY IR/ROXICODONE) 5 MG immediate release tablet  Has the patient contacted their pharmacy? No, pt has to contact doctor for refills (Agent: If no, request that the patient contact the pharmacy for the refill. If patient does not wish to contact the pharmacy document the reason why and proceed with request.) (Agent: If yes, when and what did the pharmacy advise?)  Is this the correct pharmacy for this prescription?  If no, delete pharmacy and type the correct one.  This is the patient's preferred pharmacy:  [facility] [address] [place] - [address] [place] [address] Phone: [phone] Fax: [phone]     Has the prescription been filled recently?   Is the patient out of the medication?   Has the patient been seen for an appointment in the last year OR does the patient have an upcoming appointment?   Can we respond through MyChart?   Agent: Please be advised that Rx refills may take up to 3 business days. We ask that you follow-up with your pharmacy.

## 2023-06-14 ENCOUNTER — Other Ambulatory Visit: Payer: Self-pay

## 2023-06-14 ENCOUNTER — Emergency Department (HOSPITAL_COMMUNITY): Payer: Medicare Other

## 2023-06-14 ENCOUNTER — Emergency Department (HOSPITAL_COMMUNITY)
Admission: EM | Admit: 2023-06-14 | Discharge: 2023-06-14 | Disposition: A | Discharge status: Other Acute Inpatient Hospital | Payer: Medicare Other | Attending: Emergency Medicine | Admitting: Emergency Medicine

## 2023-06-14 ENCOUNTER — Encounter (HOSPITAL_COMMUNITY): Payer: Self-pay

## 2023-06-14 DIAGNOSIS — I700 Atherosclerosis of aorta: Secondary | ICD-10-CM | POA: Diagnosis not present

## 2023-06-14 DIAGNOSIS — R918 Other nonspecific abnormal finding of lung field: Secondary | ICD-10-CM | POA: Diagnosis not present

## 2023-06-14 DIAGNOSIS — K7460 Unspecified cirrhosis of liver: Secondary | ICD-10-CM | POA: Insufficient documentation

## 2023-06-14 DIAGNOSIS — R519 Headache, unspecified: Secondary | ICD-10-CM | POA: Insufficient documentation

## 2023-06-14 DIAGNOSIS — R059 Cough, unspecified: Secondary | ICD-10-CM | POA: Insufficient documentation

## 2023-06-14 DIAGNOSIS — Z7982 Long term (current) use of aspirin: Secondary | ICD-10-CM | POA: Insufficient documentation

## 2023-06-14 DIAGNOSIS — R1031 Right lower quadrant pain: Secondary | ICD-10-CM | POA: Insufficient documentation

## 2023-06-14 DIAGNOSIS — R109 Unspecified abdominal pain: Secondary | ICD-10-CM | POA: Diagnosis not present

## 2023-06-14 DIAGNOSIS — Z20822 Contact with and (suspected) exposure to covid-19: Secondary | ICD-10-CM | POA: Insufficient documentation

## 2023-06-14 DIAGNOSIS — J189 Pneumonia, unspecified organism: Secondary | ICD-10-CM | POA: Insufficient documentation

## 2023-06-14 DIAGNOSIS — N200 Calculus of kidney: Secondary | ICD-10-CM | POA: Diagnosis not present

## 2023-06-14 DIAGNOSIS — D72829 Elevated white blood cell count, unspecified: Secondary | ICD-10-CM | POA: Insufficient documentation

## 2023-06-14 DIAGNOSIS — R197 Diarrhea, unspecified: Secondary | ICD-10-CM | POA: Insufficient documentation

## 2023-06-14 DIAGNOSIS — J90 Pleural effusion, not elsewhere classified: Secondary | ICD-10-CM | POA: Insufficient documentation

## 2023-06-14 DIAGNOSIS — Z85528 Personal history of other malignant neoplasm of kidney: Secondary | ICD-10-CM | POA: Insufficient documentation

## 2023-06-14 DIAGNOSIS — Z905 Acquired absence of kidney: Secondary | ICD-10-CM | POA: Diagnosis not present

## 2023-06-14 DIAGNOSIS — Z941 Heart transplant status: Secondary | ICD-10-CM | POA: Diagnosis not present

## 2023-06-14 DIAGNOSIS — R0602 Shortness of breath: Secondary | ICD-10-CM | POA: Diagnosis not present

## 2023-06-14 DIAGNOSIS — R590 Localized enlarged lymph nodes: Secondary | ICD-10-CM | POA: Insufficient documentation

## 2023-06-14 DIAGNOSIS — J181 Lobar pneumonia, unspecified organism: Secondary | ICD-10-CM | POA: Insufficient documentation

## 2023-06-14 LAB — COMPREHENSIVE METABOLIC PANEL
ALT: 12 U/L (ref 0–44)
AST: 14 U/L — ABNORMAL LOW (ref 15–41)
Albumin: 2.1 g/dL — ABNORMAL LOW (ref 3.5–5.0)
Alkaline Phosphatase: 308 U/L — ABNORMAL HIGH (ref 38–126)
Anion gap: 9 (ref 5–15)
BUN: 21 mg/dL — ABNORMAL HIGH (ref 6–20)
CO2: 23 mmol/L (ref 22–32)
Calcium: 8.6 mg/dL — ABNORMAL LOW (ref 8.9–10.3)
Chloride: 100 mmol/L (ref 98–111)
Creatinine, Ser: 1.31 mg/dL — ABNORMAL HIGH (ref 0.61–1.24)
GFR, Estimated: >60 mL/min (ref >60)
Glucose, Bld: 198 mg/dL — ABNORMAL HIGH (ref 70–99)
Potassium: 3.4 mmol/L — ABNORMAL LOW (ref 3.5–5.1)
Sodium: 132 mmol/L — ABNORMAL LOW (ref 135–145)
Total Bilirubin: 0.9 mg/dL (ref 0.0–1.2)
Total Protein: 5.9 g/dL — ABNORMAL LOW (ref 6.5–8.1)

## 2023-06-14 LAB — CBC WITH DIFFERENTIAL/PLATELET
Abs Immature Granulocytes: 5.1 10*3/uL — ABNORMAL HIGH (ref 0.00–0.07)
Band Neutrophils: 21 %
Basophils Absolute: 0 10*3/uL (ref 0.0–0.1)
Basophils Relative: 0 %
Eosinophils Absolute: 0 10*3/uL (ref 0.0–0.5)
Eosinophils Relative: 0 %
HCT: 33.2 % — ABNORMAL LOW (ref 39.0–52.0)
Hemoglobin: 10.4 g/dL — ABNORMAL LOW (ref 13.0–17.0)
Lymphocytes Relative: 4 %
Lymphs Abs: 0.5 10*3/uL — ABNORMAL LOW (ref 0.7–4.0)
MCH: 30.2 pg (ref 26.0–34.0)
MCHC: 31.3 g/dL (ref 30.0–36.0)
MCV: 96.5 fL (ref 80.0–100.0)
Metamyelocytes Relative: 25 %
Monocytes Absolute: 1.2 10*3/uL — ABNORMAL HIGH (ref 0.1–1.0)
Monocytes Relative: 9 %
Myelocytes: 6 %
Neutro Abs: 6.6 10*3/uL (ref 1.7–7.7)
Neutrophils Relative %: 28 %
Platelets: 660 10*3/uL — ABNORMAL HIGH (ref 150–400)
Promyelocytes Relative: 7 %
RBC: 3.44 MIL/uL — ABNORMAL LOW (ref 4.22–5.81)
RDW: 14.6 % (ref 11.5–15.5)
WBC: 13.4 10*3/uL — ABNORMAL HIGH (ref 4.0–10.5)
nRBC: 0 % (ref 0.0–0.2)

## 2023-06-14 LAB — RESP PANEL BY RT-PCR (RSV, FLU A&B, COVID)  RVPGX2
Influenza A by PCR: NEGATIVE
Influenza B by PCR: NEGATIVE
Resp Syncytial Virus by PCR: NEGATIVE
SARS Coronavirus 2 by RT PCR: NEGATIVE

## 2023-06-14 LAB — LIPASE, BLOOD: Lipase: 16 U/L (ref 11–51)

## 2023-06-14 LAB — TROPONIN I (HIGH SENSITIVITY)
Troponin I (High Sensitivity): 25 ng/L — ABNORMAL HIGH (ref <18)
Troponin I (High Sensitivity): 22 ng/L — ABNORMAL HIGH (ref <18)

## 2023-06-14 LAB — LACTIC ACID, PLASMA: Lactic Acid, Venous: 1.6 mmol/L (ref 0.5–1.9)

## 2023-06-14 LAB — BRAIN NATRIURETIC PEPTIDE: B Natriuretic Peptide: 406 pg/mL — ABNORMAL HIGH (ref 0.0–100.0)

## 2023-06-14 LAB — PROTIME-INR
INR: 1.2 (ref 0.8–1.2)
Prothrombin Time: 15.2 s (ref 11.4–15.2)

## 2023-06-14 MED ORDER — VANCOMYCIN HCL IN DEXTROSE 1-5 GM/200ML-% IV SOLN: 1000.0000 mg | Freq: Once | INTRAVENOUS | Status: DC

## 2023-06-14 MED ORDER — VANCOMYCIN HCL 1500 MG/300ML IV SOLN
1500.0000 mg | Freq: Once | INTRAVENOUS | Status: AC
  Administered 2023-06-14: 1500 mg via INTRAVENOUS
  Filled 2023-06-14: qty 300

## 2023-06-14 MED ORDER — IOHEXOL 300 MG/ML  SOLN
100.0000 mL | Freq: Once | INTRAMUSCULAR | Status: AC | PRN
  Administered 2023-06-14: 100 mL via INTRAVENOUS

## 2023-06-14 MED ORDER — CEFEPIME HCL 2 G IV SOLR
2.0000 g | Freq: Once | INTRAVENOUS | Status: AC
  Administered 2023-06-14: 2 g via INTRAVENOUS
  Filled 2023-06-14: qty 12.5

## 2023-06-14 NOTE — ED Triage Notes (Signed)
 Pt complain of right side/hip/leg pain, chest congestion, dry cough at bedtime. States this has been going on for about a week.

## 2023-06-14 NOTE — ED Provider Notes (Signed)
 6:04 PM Assumed care of patient from off-going team. For more details, please see note from same day.  In brief, this is a 53 y.o. male h/o heart transplant from [facility] with cough and RLQ pain worse with walking. CXR possible PNA. CT C/A/P shows possible lung malignancy vs dense consolidative PNA. Called transfer coordinator Norman [phone]) but haven't heard back yet. Not in any distress. Receiving vanc/cefepime .   Plan/Dispo at time of sign-out & ED Course since sign-out: [ ]  d/w transplant, likely transfer   BP 130/75   Pulse 97   Temp 98.1 F (36.7 C)   Resp 20   Ht 5' 8 (1.727 m)   Wt 68 kg   SpO2 93%   BMI 22.81 kg/m    ED Course:   Clinical Course as of 06/14/23 1804  Fri Jun 14, 2023  1214 Chest x-ray showing increased density right middle right lower lobe.  Awaiting radiology reading. [MB]  1549 D/w patient the results of his CT scan. I believe higher likelihood PNA as he just had his transplant 4 months ago but I discussed the possibility of malignancy as well. Receiving broad spectrum abx, cultures were drawn. He is HDS, afebrile, no resp distress, no hypoxia. Secretary calling [facility] center. [HN]  [id number] Accepted for transfer by Dr. Juliene Bile at [facility] cardiology transplant/advanced heart failure.  Bed request placed.  Transfer center will call back when there is a bed available. [HN]    Clinical Course User Index [HN] Franklyn Sid SAILOR, MD [MB] Towana Ozell BROCKS, MD    Dispo: Transfer to [facility] cards transplant ------------------------------- Sid Franklyn, MD Emergency Medicine  This note was created using dictation software, which may contain spelling or grammatical errors.   Franklyn Sid SAILOR, MD 06/14/23 [phone]

## 2023-06-14 NOTE — ED Notes (Signed)
 Attempted to call report x 3.

## 2023-06-14 NOTE — ED Provider Notes (Signed)
 [place] EMERGENCY DEPARTMENT AT [facility] Provider Note   CSN: [id number] Arrival date & time: 06/14/23  1039     History  No chief complaint on file.   [name] is a 53 y.o. male.  He is a very complicated gentleman with a history of cardiac transplant back in August along with cirrhosis, Wilms tumor status post nephrectomy.  He is here with a complaint of some right sided abdominal pain that is been going on for a week.  It is worse with ambulating.  it has been associated with multiple episodes of diarrhea.  He also has a headache.  Does not think he has been having a fever.  Has had a nonproductive cough.  No chest pain.  No nausea or vomiting.  He said he called his doctors at [facility] who told him to come to the hospital for further evaluation.  The history is provided by the patient.  Abdominal Pain Pain location:  RLQ Pain quality: aching   Pain severity:  Moderate Onset quality:  Gradual Duration:  1 week Timing:  Constant Progression:  Worsening Chronicity:  New Relieved by:  Nothing Associated symptoms: cough and diarrhea   Associated symptoms: no chest pain, no constipation, no dysuria, no fever, no hematemesis, no hematochezia, no hematuria, no nausea, no shortness of breath and no vomiting        Home Medications Prior to Admission medications   Medication Sig Start Date End Date Taking? Authorizing Provider  aspirin  81 MG tablet Take 81 mg by mouth daily.    [provider]  atovaquone  (MEPRON ) 750 MG/5ML suspension Take 750 mg by mouth daily.    [provider]  colchicine  0.6 MG tablet Take 0.6 mg by mouth as needed (gout flare).    [provider]  furosemide  (LASIX ) 20 MG tablet Take 40 mg by mouth. Patient not taking: Reported on 05/20/2023    [provider]  glipiZIDE (GLUCOTROL XL) 10 MG 24 hr tablet Take 10 mg by mouth daily with breakfast.    [provider]  metFORMIN (GLUCOPHAGE-XR) 500 MG  24 hr tablet Take 1,000 mg by mouth daily with breakfast.    [provider]  mycophenolate  (CELLCEPT ) 250 MG capsule Take 250 mg by mouth 2 (two) times daily.    [provider]  octreotide (SANDOSTATIN) 100 MCG/ML SOLN injection Inject 100 mcg into the skin every 12 (twelve) hours. Patient not taking: Reported on 05/20/2023    [provider]  oxyCODONE  (OXY IR/ROXICODONE ) 5 MG immediate release tablet Take 1 tablet (5 mg total) by mouth 3 (three) times daily as needed for severe pain (pain score 7-10). 06/03/23   Duanne Hussain Maimone DASEN, MD  predniSONE  (DELTASONE ) 5 MG tablet Take 15 mg by mouth daily with breakfast. Pt states takes 10 mg    [provider]  rosuvastatin  (CRESTOR ) 10 MG tablet Take 10 mg by mouth daily.    [provider]  tacrolimus  (PROGRAF ) 1 MG capsule Take 2 mg by mouth 2 (two) times daily.    [provider]  torsemide  (DEMADEX ) 20 MG tablet Take 20 mg by mouth daily. Pt takes 2 tablets q am    [provider]  traZODone  (DESYREL ) 50 MG tablet Take 0.5-1 tablets (25-50 mg total) by mouth at bedtime as needed for sleep. 03/11/23   Duanne Nichalos Brenton DASEN, MD  valGANciclovir (VALCYTE) 450 MG tablet Take 450 mg by mouth daily.    [provider]  Allergies    Ibuprofen and Nsaids    Review of Systems   Review of Systems  Constitutional:  Negative for fever.  Respiratory:  Positive for cough. Negative for shortness of breath.   Cardiovascular:  Negative for chest pain.  Gastrointestinal:  Positive for abdominal pain and diarrhea. Negative for constipation, hematemesis, hematochezia, nausea and vomiting.  Genitourinary:  Negative for dysuria and hematuria.    Physical Exam Updated Vital Signs BP 130/75   Pulse 97   Temp 98.1 F (36.7 C)   Resp 20   Ht 5' 8 (1.727 m)   Wt 68 kg   SpO2 93%   BMI 22.81 kg/m  Physical Exam Vitals and nursing note reviewed.  Constitutional:      General: He is not  in acute distress.    Appearance: Normal appearance. He is well-developed.  HENT:     Head: Normocephalic and atraumatic.  Eyes:     Conjunctiva/sclera: Conjunctivae normal.  Cardiovascular:     Rate and Rhythm: Normal rate and regular rhythm.     Heart sounds: No murmur heard. Pulmonary:     Effort: Pulmonary effort is normal. No respiratory distress.     Breath sounds: Normal breath sounds.  Abdominal:     Palpations: Abdomen is soft.     Tenderness: There is abdominal tenderness. There is no guarding or rebound.  Musculoskeletal:        General: No deformity. Normal range of motion.     Cervical back: Neck supple.  Skin:    General: Skin is warm and dry.     Capillary Refill: Capillary refill takes less than 2 seconds.  Neurological:     General: No focal deficit present.     Mental Status: He is alert.     Motor: No weakness.     ED Results / Procedures / Treatments   Labs (all labs ordered are listed, but only abnormal results are displayed) Labs Reviewed  COMPREHENSIVE METABOLIC PANEL - Abnormal; Notable for the following components:      Result Value   Sodium 132 (*)    Potassium 3.4 (*)    Glucose, Bld 198 (*)    BUN 21 (*)    Creatinine, Ser 1.31 (*)    Calcium  8.6 (*)    Total Protein 5.9 (*)    Albumin  2.1 (*)    AST 14 (*)    Alkaline Phosphatase 308 (*)    All other components within normal limits  CBC WITH DIFFERENTIAL/PLATELET - Abnormal; Notable for the following components:   WBC 13.4 (*)    RBC 3.44 (*)    Hemoglobin 10.4 (*)    HCT 33.2 (*)    Platelets 660 (*)    Lymphs Abs 0.5 (*)    Monocytes Absolute 1.2 (*)    Abs Immature Granulocytes 5.10 (*)    All other components within normal limits  BRAIN NATRIURETIC PEPTIDE - Abnormal; Notable for the following components:   B Natriuretic Peptide 406.0 (*)    All other components within normal limits  TROPONIN I (HIGH SENSITIVITY) - Abnormal; Notable for the following components:   Troponin I  (High Sensitivity) 25 (*)    All other components within normal limits  TROPONIN I (HIGH SENSITIVITY) - Abnormal; Notable for the following components:   Troponin I (High Sensitivity) 22 (*)    All other components within normal limits  RESP PANEL BY RT-PCR (RSV, FLU A&B, COVID)  RVPGX2  CULTURE, BLOOD (ROUTINE X 2)  CULTURE, BLOOD (ROUTINE X 2)  LIPASE, BLOOD  LACTIC ACID, PLASMA  PROTIME-INR  URINALYSIS, ROUTINE W REFLEX MICROSCOPIC    EKG EKG Interpretation Date/Time:  Friday June 14 2023 11:26:25 EST Ventricular Rate:  102 PR Interval:  153 QRS Duration:  103 QT Interval:  344 QTC Calculation: 449 R Axis:   135  Text Interpretation: Sinus tachycardia Left atrial enlargement Probable right ventricular hypertrophy Borderline T abnormalities, inferior leads Borderline ST elevation, lateral leads Confirmed by Towana Sharper [phone]) on 06/14/2023 11:28:43 AM  Radiology CT CHEST ABDOMEN PELVIS W CONTRAST Result Date: 06/14/2023 CLINICAL DATA:  Pneumonia, chest congestion, dry cough right-sided hip and leg pain cirrhosis * Tracking Code: BO * EXAM: CT CHEST, ABDOMEN, AND PELVIS WITH CONTRAST TECHNIQUE: Multidetector CT imaging of the chest, abdomen and pelvis was performed following the standard protocol during bolus administration of intravenous contrast. RADIATION DOSE REDUCTION: This exam was performed according to the departmental dose-optimization program which includes automated exposure control, adjustment of the mA and/or kV according to patient size and/or use of iterative reconstruction technique. CONTRAST:  OMNIPAQUE  IOHEXOL  300 MG/ML  SOLN COMPARISON:  CT abdomen pelvis, 08/03/2022 FINDINGS: CT CHEST FINDINGS Cardiovascular: Aortic atherosclerosis. Status post aortic root repair. Normal heart size. Small pericardial effusion. Mediastinum/Nodes: Enlarged right hilar and mediastinal lymph nodes, subcarinal nodes measuring up to 2.7 x 0.9 cm (series 2, image 26). Thyroid   gland, trachea, and esophagus demonstrate no significant findings. Lungs/Pleura: Dense, masslike consolidation of the right middle lobe with proximal obstruction of the right middle lobe bronchi, measuring approximately 7.9 x 7.3 cm (series 3, image 81). Trace bilateral pleural effusions. Diffuse bilateral bronchial wall thickening. Musculoskeletal: Status post median sternotomy. No acute osseous findings. CT ABDOMEN PELVIS FINDINGS Hepatobiliary: Unchanged hypodense lesion of the peripheral posterior right lobe of the liver, hepatic segment VI/VII, measuring 2.2 x 1.4 cm (series 2, image 66). Mildly coarse contour of the liver. No gallstones, gallbladder wall thickening, or biliary dilatation. Pancreas: Unremarkable. No pancreatic ductal dilatation or surrounding inflammatory changes. Spleen: Normal in size without significant abnormality. Adrenals/Urinary Tract: Unchanged benign left adrenal adenoma, for which no further follow-up or characterization is required. Status post left nephrectomy. No suspicious soft tissue or contrast enhancement in the nephrectomy bed. Nonobstructive calculus of the inferior pole of the right kidney. No ureteral calculi or hydronephrosis. Bladder is unremarkable. Stomach/Bowel: Stomach is within normal limits. Appendix appears normal. No evidence of bowel wall thickening, distention, or inflammatory changes. Vascular/Lymphatic: Aortic atherosclerosis. No enlarged abdominal or pelvic lymph nodes. Reproductive: No mass or other abnormality. Other: Small fat containing bilateral inguinal hernias.  No ascites. Musculoskeletal: No acute osseous findings. IMPRESSION: 1. Dense, masslike consolidation of the right middle lobe with proximal obstruction of the right middle lobe bronchi, measuring approximately 7.9 x 7.3 cm. Appearance is highly concerning for primary lung malignancy although could reflect lobar pneumonia. 2. Enlarged right hilar and mediastinal lymph nodes, possibly reactive  although concerning for nodal metastatic disease. 3. Unchanged hypodense lesion of the peripheral posterior right lobe of the liver, hepatic segment VI/VII, measuring 2.2 x 1.4 cm. This is incompletely characterized and stability when compared to prior examination is reassuring for benign etiology, however consider MR on a nonemergent, outpatient basis for complete characterization. 4. Postoperative findings of recent median sternotomy and aortic root repair. Small pericardial effusion. Small pleural effusions 5. Status post left nephrectomy. No suspicious soft tissue or contrast enhancement in the nephrectomy bed. 6. Nonobstructive right nephrolithiasis Aortic Atherosclerosis (ICD10-I70.0). Electronically Signed   By:  Marolyn JONETTA Jaksch M.D.   On: 06/14/2023 15:15   DG Chest Port 1 View Result Date: 06/14/2023 CLINICAL DATA:  Cough, congestion and shortness of breath. EXAM: PORTABLE CHEST 1 VIEW COMPARISON:  09/05/2021. FINDINGS: There is heterogeneous opacity overlying the right lower lung zone, obscuring the right heart border, without volume loss, favored to represent consolidation in the middle lobe. There is also small right pleural effusion blunting the right lateral costophrenic angle. There are patchy atelectatic changes/scarring in the left lower lung zone. Bilateral lung fields are otherwise clear. Left lateral costophrenic angle is clear. Stable cardio-mediastinal silhouette. No acute osseous abnormalities. The soft tissues are within normal limits. Sternotomy wires and embolization coils along the left medial lung apex noted. IMPRESSION: *Findings favor middle lobe consolidation. Correlate clinically. Follow-up to clearing is recommended. There is also small right pleural effusion. Electronically Signed   By: Ree Molt M.D.   On: 06/14/2023 13:03    Procedures .Critical Care  Performed by: Towana Ozell BROCKS, MD Authorized by: Towana Ozell BROCKS, MD   Critical care provider statement:     Critical care time (minutes):  45   Critical care time was exclusive of:  Separately billable procedures and treating other patients   Critical care was necessary to treat or prevent imminent or life-threatening deterioration of the following conditions:  Respiratory failure and metabolic crisis   Critical care was time spent personally by me on the following activities:  Development of treatment plan with patient or surrogate, discussions with consultants, evaluation of patient's response to treatment, examination of patient, obtaining history from patient or surrogate, ordering and performing treatments and interventions, ordering and review of laboratory studies, ordering and review of radiographic studies, pulse oximetry, re-evaluation of patient's condition and review of old charts   I assumed direction of critical care for this patient from another provider in my specialty: no       Medications Ordered in ED Medications  vancomycin  (VANCOREADY) IVPB 1500 mg/300 mL (1,500 mg Intravenous New Bag/Given 06/14/23 1630)  iohexol  (OMNIPAQUE ) 300 MG/ML solution 100 mL (100 mLs Intravenous Contrast Given 06/14/23 1242)  ceFEPIme  (MAXIPIME ) 2 g in sodium chloride  0.9 % 100 mL IVPB (0 g Intravenous Stopped 06/14/23 1629)    ED Course/ Medical Decision Making/ A&P Clinical Course as of 06/14/23 1737  Fri Jun 14, 2023  1214 Chest x-ray showing increased density right middle right lower lobe.  Awaiting radiology reading. [MB]  1549 D/w patient the results of his CT scan. I believe higher likelihood PNA as he just had his transplant 4 months ago but I discussed the possibility of malignancy as well. Receiving broad spectrum abx, cultures were drawn. He is HDS, afebrile, no resp distress, no hypoxia. Secretary calling [facility] center. [HN]  [id number] Accepted for transfer by Dr. Juliene Bile at [facility] cardiology transplant/advanced heart failure.  Bed request placed.  Transfer center will call back when there is a  bed available. [HN]    Clinical Course User Index [HN] Franklyn Sid SAILOR, MD [MB] Towana Ozell BROCKS, MD                                 Medical Decision Making Amount and/or Complexity of Data Reviewed Labs: ordered. Radiology: ordered.  Risk Prescription drug management.   This patient complains of cough right sided abdominal pain; this involves an extensive number of treatment Options and is a complaint that carries with it  a high risk of complications and morbidity. The differential includes pneumonia pneumothorax, liver disease, cholelithiasis, cholecystitis, appendicitis, diverticulitis, obstruction, CHF, PE  I ordered, reviewed and interpreted labs, which included CBC with elevated white count, hemoglobin down from priors, chemistries with elevation of glucose BUN and creatinine, LFTs mildly elevated, COVID and flu negative, troponins elevated but flat, blood culture sent, lactate normal I ordered medication IV antibiotics and reviewed PMP when indicated. I ordered imaging studies which included chest x-ray and CT chest abdomen and pelvis and I independently    visualized and interpreted imaging which showed right middle lobe mass versus infiltrate.  Final reading on CT pending at time of signout Previous records obtained and reviewed in epic including recent transplant notes from [facility] I reached out to [facility] transplant coordinator and awaiting callback Cardiac monitoring reviewed, sinus rhythm Social determinants considered, tobacco use physically inactive Critical Interventions: Initiation of antibiotics and complex workup of respiratory and abdominal symptoms  After the interventions stated above, I reevaluated the patient and found patient to be satting well on room air in no distress Admission and further testing considered, he will likely need transfer to [facility] for further management.  Will start on antibiotics for possible pneumonia.  Patient's care signed out to Dr. Earnie to  coordinate care with [facility].         Final Clinical Impression(s) / ED Diagnoses Final diagnoses:  Pneumonia of right middle lobe due to infectious organism  Heart transplant recipient [facility])    Rx / DC Orders ED Discharge Orders     None         Towana Ozell BROCKS, MD 06/14/23 1742

## 2023-06-15 DIAGNOSIS — J189 Pneumonia, unspecified organism: Secondary | ICD-10-CM | POA: Insufficient documentation

## 2023-06-17 DIAGNOSIS — B348 Other viral infections of unspecified site: Secondary | ICD-10-CM | POA: Insufficient documentation

## 2023-06-19 DIAGNOSIS — D638 Anemia in other chronic diseases classified elsewhere: Secondary | ICD-10-CM | POA: Insufficient documentation

## 2023-06-19 LAB — CULTURE, BLOOD (ROUTINE X 2)
Culture: NO GROWTH
Culture: NO GROWTH
Special Requests: ADEQUATE
Special Requests: ADEQUATE

## 2023-06-24 DIAGNOSIS — A430 Pulmonary nocardiosis: Secondary | ICD-10-CM | POA: Insufficient documentation

## 2023-06-27 DIAGNOSIS — S72001A Fracture of unspecified part of neck of right femur, initial encounter for closed fracture: Secondary | ICD-10-CM | POA: Insufficient documentation

## 2023-06-28 ENCOUNTER — Telehealth: Payer: Self-pay | Admitting: *Deleted

## 2023-06-28 ENCOUNTER — Encounter: Payer: Self-pay | Admitting: *Deleted

## 2023-06-28 NOTE — Transitions of Care (Post Inpatient/ED Visit) (Signed)
06/28/2023  Name: [name] MRN: [id number] DOB: [birth year]  Today's TOC FU Call Status: Today's TOC FU Call Status:: Successful TOC FU Call Completed TOC FU Call Complete Date: 06/28/23 Patient's Name and Date of Birth confirmed.  Transition Care Management Follow-up Telephone Call Date of Discharge: 06/27/23 Discharge Facility: Other ([facility] Facility) Name of Other ([facility]) [facility]charge Facility: [facility] Type of Discharge: Inpatient Admission Primary Inpatient Discharge Diagnosis:: Nocardial Pneumonia How have you been since you were released from the hospital?: Better (pt states he is feeling good, eating well, no issues w/ bowel/ bladder, has meds taking as prescribed, ambulating well) Any questions or concerns?: No  Items Reviewed: Did you receive and understand the discharge instructions provided?: Yes Medications obtained,verified, and reconciled?: Yes (Medications Reviewed) Any new allergies since your discharge?: No Dietary orders reviewed?: Yes Type of Diet Ordered:: heart healthy, carb modified Do you have support at home?: Yes People in Home: child(ren), adult Name of Support/Comfort Primary Source: [name] adult son Reviewed signs/ symptoms of infection/ pneumonia, reportable signs/ symptoms Patient verbalizes understanding to call heart transplant team for any issues Patient is aware to hold glipizide and continue taking metformin and insulin sliding scale Patient states he has a lot going on working with home health/ infusion and following up with [facility], declines enrollment in 30 day program  Medications Reviewed Today: Medications Reviewed Today     Reviewed by Audrie Gallus, RN (Registered Nurse) on 06/28/23 at 0932  Med List Status: <None>   Medication Order Taking? Sig Documenting Provider Last Dose Status Informant  aspirin 81 MG tablet [id number] Yes Take 81 mg by mouth daily. [provider] Taking Active Self            Med Note Abbe Amsterdam, HEATHER L   Mon Aug 24, 2013  1:48 PM)     atovaquone [facility]) 750 MG/5ML suspension [id number] Yes Take 750 mg by mouth daily. [provider] Taking Active Self  Calcium Carb-Cholecalciferol 600-10 MG-MCG TABS [id number] Yes Take 1 tablet by mouth in the morning and at bedtime. Lunch and dinner [provider] Taking Active Self  furosemide (LASIX) 20 MG tablet [id number] Yes Take 40 mg by mouth daily as needed for fluid or edema. [provider] Taking Active Self  glipiZIDE (GLUCOTROL XL) 10 MG 24 hr tablet [id number] No Take 10 mg by mouth daily with breakfast.  Patient not taking: Reported on 06/28/2023   [provider] Not Taking Active Self  insulin regular (NOVOLIN R) 100 units/mL injection [id number] Yes Inject into the skin as needed for high blood sugar. Sliding scale: 70-200: NO extra regular insulin, 201-250: Take 2 extra units, 251-300: Take 4 extra units, 301-350: take 6 units, 351-400:take 8 units, greater than 400: take 10 extra units and call your doctor [provider] Taking Active Self  magnesium oxide (MAG-OX) 400 MG tablet [id number] Yes Take 800 mg by mouth 2 (two) times daily. Lunch and dinner [provider] Taking Active Self  metFORMIN (GLUCOPHAGE-XR) 500 MG 24 hr tablet [id number] Yes Take 1,000 mg by mouth 2 (two) times daily with a meal. [provider] Taking Active Self  mycophenolate (CELLCEPT) 250 MG capsule [id number] Yes Take 250 mg by mouth 2 (two) times daily. [provider] Taking Active Self  oxyCODONE (OXY IR/ROXICODONE) 5 MG immediate release tablet [id number] Yes Take 1 tablet (5 mg total) by mouth 3 (three) times daily as needed for severe pain (pain score 7-10). Donita Brooks,  MD Taking Active Self  pantoprazole (PROTONIX) 40 MG tablet [id number] Yes Take 40 mg by mouth daily. [provider] Taking Active Self  predniSONE (DELTASONE) 5 MG tablet [id number]  Yes Take 15 mg by mouth daily with breakfast. Pt states takes 10 mg [provider] Taking Active Self  rosuvastatin (CRESTOR) 10 MG tablet [id number] Yes Take 10 mg by mouth daily. [provider] Taking Active Self  tacrolimus (PROGRAF) 1 MG capsule [id number] Yes Take 2 mg by mouth 2 (two) times daily. [provider] Taking Active Self  valGANciclovir (VALCYTE) 450 MG tablet [id number] Yes Take 450 mg by mouth 2 (two) times daily. [provider] Taking Active Self            Home Care and Equipment/Supplies: Were Home Health Services Ordered?: Yes Name of Home Health Agency:: [facility] labs) - [place], [place], pt states otw to his house now,  medication for infusion already delivered per pt Has Agency set up a time to come to your home?: Yes First Home Health Visit Date: 06/28/23 Any new equipment or medical supplies ordered?: Yes Name of Medical supply agency?: [facility]-  Infusion supplies Were you able to get the equipment/medical supplies?: Yes Do you have any questions related to the use of the equipment/supplies?: No (pt states he is independent with IV infusion as he has done in the past)  Functional Questionnaire: Do you need assistance with bathing/showering or dressing?: No Do you need assistance with meal preparation?: No Do you need assistance with eating?: No Do you have difficulty maintaining continence: No Do you need assistance with getting out of bed/getting out of a chair/moving?: No Do you have difficulty managing or taking your medications?: No  Follow up appointments reviewed: PCP Follow-up appointment confirmed?: No (pt states he will not be following up w/ PCP, will follow up w/ transplant team at [facility]) MD Provider Line [phone] Given: No Specialist Hospital Follow-up appointment confirmed?: Yes Date of Specialist follow-up appointment?: 07/18/23 Follow-Up Specialty Provider:: Rush Landmark MD  [facility] Infectious Disease Team Do you need transportation to your follow-up appointment?: No Do you understand care options if your condition(s) worsen?: Yes-patient verbalized understanding  SDOH Interventions Today    Flowsheet Row Most Recent Value  SDOH Interventions   Food Insecurity Interventions Intervention Not Indicated  Housing Interventions Intervention Not Indicated  Transportation Interventions Intervention Not Indicated  Utilities Interventions Intervention Not Indicated       Irving Shows [facility], BSN RN Care Manager/ Transition of Care [place]/ [facility] Population Health [phone]

## 2023-07-05 ENCOUNTER — Other Ambulatory Visit: Payer: Self-pay | Admitting: Family Medicine

## 2023-07-05 MED ORDER — OXYCODONE HCL 5 MG PO TABS: 5.0000 mg | ORAL_TABLET | Freq: Three times a day (TID) | ORAL | 0 refills | Status: DC | PRN

## 2023-07-05 NOTE — Telephone Encounter (Signed)
Copied from CRM [id number]. Topic: Clinical - Medication Refill >> Jul 05, 2023  9:04 AM Prudencio Pair wrote: Most Recent Primary Care Visit:  Provider: Lynnea Ferrier T  Department: [facility]  Visit Type: HOSPITAL FU  Date: 03/11/2023  Medication: oxyCODONE (OXY IR/ROXICODONE) 5 MG immediate release tablet   Has the patient contacted their pharmacy? No, no remaining refills (Agent: If no, request that the patient contact the pharmacy for the refill. If patient does not wish to contact the pharmacy document the reason why and proceed with request.) (Agent: If yes, when and what did the pharmacy advise?)  Is this the correct pharmacy for this prescription? Yes If no, delete pharmacy and type the correct one.  This is the patient's preferred pharmacy:  [facility] [address], [place] - [address] [place] [address] Phone: [phone] Fax: [phone]   Has the prescription been filled recently? Yes  Is the patient out of the medication? Yes  Has the patient been seen for an appointment in the last year OR does the patient have an upcoming appointment? Yes  Can we respond through MyChart? No  Agent: Please be advised that Rx refills may take up to 3 business days. We ask that you follow-up with your pharmacy.

## 2023-07-16 ENCOUNTER — Other Ambulatory Visit: Payer: Self-pay | Admitting: Family Medicine

## 2023-07-16 DIAGNOSIS — E7800 Pure hypercholesterolemia, unspecified: Secondary | ICD-10-CM

## 2023-07-16 NOTE — Telephone Encounter (Signed)
 Rx discontinued 03/11/23 by PCP Requested Prescriptions  Pending Prescriptions Disp Refills   atorvastatin  (LIPITOR) 20 MG tablet [Pharmacy Med Name: Atorvastatin  Calcium  20 MG Oral Tablet] 90 tablet 0    Sig: Take 1 tablet by mouth once daily     Cardiovascular:  Antilipid - Statins Failed - 07/16/2023 11:23 AM      Failed - Valid encounter within last 12 months    Recent Outpatient Visits           1 year ago Hypokalemia   [facility] Duanne Butler DASEN, MD   1 year ago Chronic systolic congestive heart failure (HCC)   [facility] Pickard, Butler DASEN, MD   2 years ago Need for immunization against influenza   [facility] Duanne Butler DASEN, MD   2 years ago Type 2 diabetes mellitus with other specified complication, without long-term current use of insulin  (HCC)   [facility] Duanne Butler DASEN, MD   3 years ago Chronic systolic congestive heart failure (HCC)   [facility] [place], Theodoro FALCON, MD       Future Appointments             In 1 month Pickard, Butler DASEN, MD [facility]            Failed - Lipid Panel in normal range within the last 12 months    Cholesterol  Date Value Ref Range Status  06/15/2022 200 (H) <200 mg/dL Final   LDL Cholesterol (Calc)  Date Value Ref Range Status  06/15/2022 129 (H) mg/dL (calc) Final    Comment:    Reference range: <100 . Desirable range <100 mg/dL for primary prevention;   <70 mg/dL for patients with CHD or diabetic patients  with > or = 2 CHD risk factors. SABRA LDL-C is now calculated using the Martin-Hopkins  calculation, which is a validated novel method providing  better accuracy than the Friedewald equation in the  estimation of LDL-C.  Gladis APPLETHWAITE et al. SANDREA. 7986;689(80): 2061-2068  (http://education.[facility].com/faq/FAQ164)    HDL  Date Value Ref Range Status  06/15/2022 54 > OR = 40 mg/dL Final    Triglycerides  Date Value Ref Range Status  06/15/2022 71 <150 mg/dL Final         Passed - Patient is not pregnant

## 2023-07-22 DIAGNOSIS — M1611 Unilateral primary osteoarthritis, right hip: Secondary | ICD-10-CM | POA: Insufficient documentation

## 2023-07-22 DIAGNOSIS — M25551 Pain in right hip: Secondary | ICD-10-CM | POA: Insufficient documentation

## 2023-08-02 ENCOUNTER — Other Ambulatory Visit: Payer: Self-pay | Admitting: Family Medicine

## 2023-08-02 MED ORDER — OXYCODONE HCL 5 MG PO TABS
5.0000 mg | ORAL_TABLET | Freq: Three times a day (TID) | ORAL | 0 refills | Status: DC | PRN
Start: 2023-08-02 — End: 2023-08-30

## 2023-08-02 NOTE — Telephone Encounter (Signed)
 Requested medication (s) are due for refill today: routing for review  Requested medication (s) are on the active medication list: yes  Last refill:  07/05/23  Future visit scheduled: yes  Notes to clinic:  Unable to refill per protocol, cannot delegate.      Requested Prescriptions  Pending Prescriptions Disp Refills   oxyCODONE (OXY IR/ROXICODONE) 5 MG immediate release tablet 60 tablet 0    Sig: Take 1 tablet (5 mg total) by mouth 3 (three) times daily as needed for severe pain (pain score 7-10).     Not Delegated - Analgesics:  Opioid Agonists Failed - 08/02/2023  4:08 PM      Failed - This refill cannot be delegated      Failed - Urine Drug Screen completed in last 360 days      Failed - Valid encounter within last 3 months    Recent Outpatient Visits           1 year ago Hypokalemia   [facility] Donita Brooks, MD   1 year ago Chronic systolic congestive heart failure (HCC)   [facility] Donita Brooks, MD   2 years ago Need for immunization against influenza   [facility] Donita Brooks, MD   2 years ago Type 2 diabetes mellitus with other specified complication, without long-term current use of insulin (HCC)   [facility] Donita Brooks, MD   3 years ago Chronic systolic congestive heart failure (HCC)   [facility] [place], Velna Hatchet, MD       Future Appointments             In 1 month Pickard, Priscille Heidelberg, MD [facility]

## 2023-08-02 NOTE — Telephone Encounter (Signed)
 Copied from CRM [id number]. Topic: Clinical - Medication Refill >> Aug 02, 2023  8:30 AM Carlatta H wrote: Most Recent Primary Care Visit:  Provider: Lynnea Ferrier T  [facility]  Visit Type: HOSPITAL FU  Date: 03/11/2023  Medication: oxyCODONE (OXY IR/ROXICODONE) 5 MG immediate release tablet [[id number]]  Has the patient contacted their pharmacy? No (Agent: If no, request that the patient contact the pharmacy for the refill. If patient does not wish to contact the pharmacy document the reason why and proceed with request.) (Agent: If yes, when and what did the pharmacy advise?)  Is this the correct pharmacy for this prescription? Yes If no, delete pharmacy and type the correct one.  This is the patient's preferred pharmacy:  [facility] [address], [place] - [address] [place] [address] Phone: [phone] Fax: [phone]   Has the prescription been filled recently? No  Is the patient out of the medication? Yes  Has the patient been seen for an appointment in the last year OR does the patient have an upcoming appointment? Yes  Can we respond through MyChart? No  Agent: Please be advised that Rx refills may take up to 3 business days. We ask that you follow-up with your pharmacy.

## 2023-08-30 ENCOUNTER — Other Ambulatory Visit: Payer: Self-pay | Admitting: Family Medicine

## 2023-08-30 NOTE — Telephone Encounter (Signed)
 Copied from CRM [id number]. Topic: Clinical - Medication Refill >> Aug 30, 2023  8:57 AM Antwanette L wrote: Most Recent Primary Care Visit:  Provider: Lynnea Ferrier T  [facility]: [facility]  Visit Type: HOSPITAL FU  Date: 03/11/2023  Medication: oxyCODONE (OXY IR/ROXICODONE) 5 MG immediate release tablet  Has the patient contacted their pharmacy? No   Is this the correct pharmacy for this prescription? Yes If no, delete pharmacy and type the correct one.  This is the patient's preferred pharmacy:  [facility] [address] [place] - [address] [place] [address] Phone: [phone] Fax: [phone]   Has the prescription been filled recently? No  Is the patient out of the medication? No  Has the patient been seen for an appointment in the last year OR does the patient have an upcoming appointment? Yes  Can we respond through MyChart? No. Contact the patient by phone at [phone]  Agent: Please be advised that Rx refills may take up to 3 business days. We ask that you follow-up with your pharmacy.

## 2023-09-02 MED ORDER — OXYCODONE HCL 5 MG PO TABS: 5.0000 mg | ORAL_TABLET | Freq: Three times a day (TID) | ORAL | 0 refills | Status: DC | PRN

## 2023-09-02 NOTE — Telephone Encounter (Signed)
 Requested medications are due for refill today.  yes  Requested medications are on the active medications list.  yes  Last refill. 08/02/2023 #60 0 rf  Future visit scheduled.   yes  Notes to clinic.  Refill not delegated.    Requested Prescriptions  Pending Prescriptions Disp Refills   oxyCODONE (OXY IR/ROXICODONE) 5 MG immediate release tablet 60 tablet 0    Sig: Take 1 tablet (5 mg total) by mouth 3 (three) times daily as needed for severe pain (pain score 7-10).     Not Delegated - Analgesics:  Opioid Agonists Failed - 09/02/2023  8:07 AM      Failed - This refill cannot be delegated      Failed - Urine Drug Screen completed in last 360 days      Failed - Valid encounter within last 3 months    Recent Outpatient Visits           1 year ago Hypokalemia   [facility] Donita Brooks, MD   1 year ago Chronic systolic congestive heart failure (HCC)   [facility] Donita Brooks, MD   2 years ago Need for immunization against influenza   [facility] Donita Brooks, MD   2 years ago Type 2 diabetes mellitus with other specified complication, without long-term current use of insulin (HCC)   [facility] Donita Brooks, MD   3 years ago Chronic systolic congestive heart failure (HCC)   [facility] [place], Velna Hatchet, MD       Future Appointments             In 1 week Pickard, Priscille Heidelberg, MD [facility]

## 2023-09-09 ENCOUNTER — Encounter: Payer: Self-pay | Admitting: Family Medicine

## 2023-09-09 ENCOUNTER — Ambulatory Visit (INDEPENDENT_AMBULATORY_CARE_PROVIDER_SITE_OTHER): Payer: Medicare HMO | Admitting: Family Medicine

## 2023-09-09 VITALS — BP 142/86 | HR 107 | Temp 98.8°F | Ht 68.0 in | Wt 143.6 lb

## 2023-09-09 DIAGNOSIS — Z125 Encounter for screening for malignant neoplasm of prostate: Secondary | ICD-10-CM

## 2023-09-09 DIAGNOSIS — Z7984 Long term (current) use of oral hypoglycemic drugs: Secondary | ICD-10-CM

## 2023-09-09 DIAGNOSIS — E1169 Type 2 diabetes mellitus with other specified complication: Secondary | ICD-10-CM

## 2023-09-09 MED ORDER — BLOOD GLUCOSE MONITORING SUPPL DEVI: 1.0000 | Freq: Three times a day (TID) | 0 refills | Status: AC

## 2023-09-09 MED ORDER — LANCET DEVICE MISC: 1.0000 | Freq: Three times a day (TID) | 0 refills | Status: AC

## 2023-09-09 MED ORDER — LANCETS MISC. MISC: 1.0000 | Freq: Three times a day (TID) | 0 refills | Status: AC

## 2023-09-09 MED ORDER — BLOOD GLUCOSE TEST VI STRP: 1.0000 | ORAL_STRIP | Freq: Three times a day (TID) | 0 refills | Status: AC

## 2023-09-09 MED ORDER — CETIRIZINE HCL 10 MG PO TABS: 10.0000 mg | ORAL_TABLET | Freq: Every day | ORAL | 3 refills | Status: DC

## 2023-09-09 NOTE — Progress Notes (Signed)
 Subjective:    Patient ID: [name], male    DOB: [birth year], 53 y.o.   MRN: [id number]  Patient is a 53 year old Caucasian gentleman with a history of nonischemic cardiomyopathy/congestive heart failure as well as an AICD placement.  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  He was recently admitted to [facility] for a heart transplant.  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  This is being managed by the transplant team.  .  He is also on atovaquone for PCP and toxoplasmosis prophylaxis.  He is also on Valcyte for CMV prophylaxis.  Immunosuppression includes prednisone, mycophenolate, and tacrolimus.  Patient states that he was diagnosed with atypical pneumonia and was treated with [facility] last fall.  He also was placed on glipizide for hyperglycemia related to his immunosuppression.  He is overdue today for prostate cancer screening, to check a hemoglobin A1c.  We discussed vaccination.  He is in the pneumonia vaccine as well as Shingrix however he would like to discuss this with his transplant physician at [facility] prior to receiving them. Past Medical History:  Diagnosis Date   AICD (automatic cardioverter/defibrillator) present    Arthritis    Cardiomyopathy, nonischemic (HCC)    takes Digoxin daily and Carvedilol daily   CHF (congestive heart failure) (HCC)    takes Lasix daily as well Aldactone   Chronic back pain    Chronic systolic heart failure (HCC)    Cirrhosis (HCC)    Depression    takes Prozac daily   GERD (gastroesophageal reflux disease)    takes Omeprazole daily   Hx of Alcohol consumption heavy    rare beer currently   hx of Tobacco abuse    quit smoking 2014   Hyperlipidemia    was on Simvastatin but has been off a year   Insomnia    takes Ambien as needed(just got script yesterday)   Orthostatic hypotension    Scoliosis    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 70 (XRT and chemo)   Past Surgical  History:  Procedure Laterality Date   BIOPSY  10/31/2022   Procedure: BIOPSY;  Surgeon: Benancio Deeds, MD;  Location[facility] ENDOSCOPY;  Service: Gastroenterology;;   Fidela Salisbury RELEASE Right 01/09/2013   Procedure: CARPAL TUNNEL RELEASE;  Surgeon: Carmela Hurt, MD;  Location: [facility] NEURO ORS;  Service: Neurosurgery;  Laterality: Right;  RIGHT carpal tunnel release   CARPAL TUNNEL RELEASE Left 01/30/2013   Procedure: LEFT CARPAL TUNNEL RELEASE;  Surgeon: Carmela Hurt, MD;  Location: [facility] NEURO ORS;  Service: Neurosurgery;  Laterality: Left;  LEFT Carpal Tunnel release   CHEST TUBE INSERTION Right 09/09/2013   Procedure: INSERTION PLEURAL DRAINAGE CATHETER;  Surgeon: Kerin Perna, MD;  Location: [facility] OR;  Service: Thoracic;  Laterality: Right;   COLONOSCOPY WITH PROPOFOL N/A 10/31/2022   Procedure: COLONOSCOPY WITH PROPOFOL;  Surgeon: Benancio Deeds, MD;  Location: [facility] ENDOSCOPY;  Service: Gastroenterology;  Laterality: N/A;   HYDROCELE EXCISION Right 04/11/2017   Procedure: RIGHT HYDROCELECTOMY ADULT;  Surgeon: Bjorn Pippin, MD;  Location: [facility] ORS;  Service: Urology;  Laterality: Right;   hydrocelectomy  2008   ICD  05/25/2011   [facility] [employer] SG lead/Energen single chamber device   IMPLANTABLE CARDIOVERTER DEFIBRILLATOR IMPLANT N/A 05/25/2011   Procedure: IMPLANTABLE CARDIOVERTER DEFIBRILLATOR IMPLANT;  Surgeon: Marinus Maw, MD;  [facility] LAB;  Service: Cardiovascular;  Laterality: N/A;  IR FLUORO GUIDE CV LINE RIGHT  01/16/2023   IR US GUIDE VASC ACCESS RIGHT  01/16/2023   NEPHRECTOMY  36 yrs ago   Wilms Tumor   POLYPECTOMY  10/31/2022   Procedure: POLYPECTOMY;  Surgeon: Benancio Deeds, MD;  Location: [facility] ENDOSCOPY;  Service: Gastroenterology;;   RIGHT HEART CATH N/A 01/29/2019   Procedure: RIGHT HEART CATH;  Surgeon: Dolores Patty, MD;  Location: [facility] INVASIVE CV LAB;  Service: Cardiovascular;  Laterality: N/A;   RIGHT HEART CATH N/A 09/08/2019    Procedure: RIGHT HEART CATH;  Surgeon: Dolores Patty, MD;  Location: [facility] INVASIVE CV LAB;  Service: Cardiovascular;  Laterality: N/A;   RIGHT HEART CATH N/A 01/15/2023   Procedure: RIGHT HEART CATH;  Surgeon: Dolores Patty, MD;  Location: [facility] INVASIVE CV LAB;  Service: Cardiovascular;  Laterality: N/A;   RIGHT/LEFT HEART CATH AND CORONARY ANGIOGRAPHY N/A 02/12/2017   Procedure: RIGHT/LEFT HEART CATH AND CORONARY ANGIOGRAPHY;  Surgeon: Dolores Patty, MD;  Location: [facility] INVASIVE CV LAB;  Service: Cardiovascular;  Laterality: N/A;   ULNAR NERVE TRANSPOSITION Right 01/09/2013   Procedure: ULNAR NERVE DECOMPRESSION/TRANSPOSITION;  Surgeon: Carmela Hurt, MD;  Location: [facility] NEURO ORS;  Service: Neurosurgery;  Laterality: Right;  RIGHT ulnar nerve decompression   Current Outpatient Medications on File Prior to Visit  Medication Sig Dispense Refill   aspirin 81 MG tablet Take 81 mg by mouth daily.     atovaquone (MEPRON) 750 MG/5ML suspension Take 750 mg by mouth daily.     Calcium Carb-Cholecalciferol 600-10 MG-MCG TABS Take 1 tablet by mouth in the morning and at bedtime. Lunch and dinner     furosemide (LASIX) 20 MG tablet Take 40 mg by mouth daily as needed for fluid or edema.     insulin regular (NOVOLIN R) 100 units/mL injection Inject into the skin as needed for high blood sugar. Sliding scale: 70-200: NO extra regular insulin, 201-250: Take 2 extra units, 251-300: Take 4 extra units, 301-350: take 6 units, 351-400:take 8 units, greater than 400: take 10 extra units and call your doctor     magnesium oxide (MAG-OX) 400 MG tablet Take 800 mg by mouth 2 (two) times daily. Lunch and dinner     metFORMIN (GLUCOPHAGE-XR) 500 MG 24 hr tablet Take 1,000 mg by mouth 2 (two) times daily with a meal.     mycophenolate (CELLCEPT) 250 MG capsule Take 250 mg by mouth 2 (two) times daily.     oxyCODONE (OXY IR/ROXICODONE) 5 MG immediate release tablet Take 1 tablet (5 mg total) by mouth 3 (three) times  daily as needed for severe pain (pain score 7-10). 60 tablet 0   pantoprazole (PROTONIX) 40 MG tablet Take 40 mg by mouth daily.     predniSONE (DELTASONE) 5 MG tablet Take 7.5 mg by mouth daily with breakfast. Pt states takes 10 mg     rosuvastatin (CRESTOR) 10 MG tablet Take 10 mg by mouth daily.     tacrolimus (PROGRAF) 1 MG capsule Take 2 mg by mouth 2 (two) times daily.     valGANciclovir (VALCYTE) 450 MG tablet Take 450 mg by mouth 2 (two) times daily.     No current facility-administered medications on file prior to visit.   Allergies  Allergen Reactions   Ibuprofen Hives   Nsaids     ELEVATED LFT'S   Social History   Socioeconomic History   Marital status: Single    Spouse name: Not on file   Number  of children: 1   Years of education: Not on file   Highest education level: Not on file  Occupational History   Occupation: Full time    Comment: Engineer, petroleum  Tobacco Use   Smoking status: Former    Current packs/day: 0.00    Average packs/day: 0.3 packs/day for 20.0 years (5.0 ttl pk-yrs)    Types: Cigarettes    Start date: 09/07/1992    Quit date: 09/07/2012    Years since quitting: 11.0   Smokeless tobacco: Current    Types: Snuff  Vaping Use   Vaping status: Never Used  Substance and Sexual Activity   Alcohol use: Yes    Alcohol/week: 0.0 standard drinks of alcohol    Comment: histoorically a heavy drinker ("beer only"), none for a couple months    Drug use: No   Sexual activity: Not Currently  Other Topics Concern   Not on file  Social History Narrative   Not on file   Social Drivers of Health   Financial Resource Strain: Low Risk  (06/15/2023)   Received from [facility]   Overall Financial Resource Strain (CARDIA)    Difficulty of Paying Living Expenses: Not hard at all  Food Insecurity: No Food Insecurity (06/28/2023)   Hunger Vital Sign    Worried About Running Out of Food in the Last Year: Never true    Ran Out of Food in the  Last Year: Never true  Transportation Needs: No Transportation Needs (06/28/2023)   PRAPARE - Administrator, Civil Service (Medical): No    Lack of Transportation (Non-Medical): No  Physical Activity: Inactive (07/13/2022)   Exercise Vital Sign    Days of Exercise per Week: 0 days    Minutes of Exercise per Session: 0 min  Stress: No Stress Concern Present (07/13/2022)   [employer] of Occupational Health - Occupational Stress Questionnaire    Feeling of Stress : Not at all  Social Connections: Moderately Isolated (07/13/2022)   Social Connection and Isolation Panel [NHANES]    Frequency of Communication with Friends and Family: Three times a week    Frequency of Social Gatherings with Friends and Family: Three times a week    Attends Religious Services: Never    Active Member of Clubs or Organizations: Yes    Attends Banker Meetings: Never    Marital Status: Never married  Intimate Partner Violence: Not At Risk (06/28/2023)   Humiliation, Afraid, Rape, and Kick questionnaire    Fear of Current or Ex-Partner: No    Emotionally Abused: No    Physically Abused: No    Sexually Abused: No     Review of Systems  All other systems reviewed and are negative.      Objective:   Physical Exam Vitals reviewed.  Constitutional:      General: He is not in acute distress.    Appearance: Normal appearance. He is normal weight. He is not ill-appearing or toxic-appearing.  Cardiovascular:     Rate and Rhythm: Normal rate and regular rhythm.     Heart sounds: Normal heart sounds. No murmur heard.    No friction rub. No gallop.  Pulmonary:     Effort: Pulmonary effort is normal. No respiratory distress.     Breath sounds: Normal breath sounds. No stridor. No wheezing, rhonchi or rales.  Chest:    Abdominal:     General: Abdomen is flat. Bowel sounds are normal.     Palpations: Abdomen  is soft.  Musculoskeletal:        General: No swelling or tenderness.      Right lower leg: No edema.     Left lower leg: No edema.  Skin:    Findings: No erythema.  Neurological:     General: No focal deficit present.     Mental Status: He is alert and oriented to person, place, and time. Mental status is at baseline.     Cranial Nerves: No cranial nerve deficit.     Motor: No weakness.     Gait: Gait normal.          Assessment & Plan:  Type 2 diabetes mellitus with other specified complication, without long-term current use of insulin (HCC) - Plan: CBC with Differential/Platelet, COMPLETE METABOLIC PANEL WITHOUT GFR, Lipid panel, Hemoglobin A1c  Prostate cancer screening - Plan: PSA I will check a CMP, fasting lipid panel, and hemoglobin A1c.  I would like to see his A1c less than 6.5 and his LDL cholesterol less than 161.  Monitor a CMP and a CBC.  Check a PSA to screen for prostate cancer.  Colonoscopy was up-to-date and performed last year.  Recommended Shingrix as well as capvaxive however the patient would like to discuss this with his transplant team prior to receiving any immunizations

## 2023-09-10 LAB — LIPID PANEL
Cholesterol: 159 mg/dL (ref <200)
HDL: 67 mg/dL (ref >=40)
LDL Cholesterol (Calc): 73 mg/dL
Non-HDL Cholesterol (Calc): 92 mg/dL (ref <130)
Total CHOL/HDL Ratio: 2.4 (calc) (ref <5.0)
Triglycerides: 106 mg/dL (ref <150)

## 2023-09-10 LAB — CBC WITH DIFFERENTIAL/PLATELET
Absolute Lymphocytes: 782 {cells}/uL — ABNORMAL LOW (ref 850–3900)
Absolute Monocytes: 782 {cells}/uL (ref 200–950)
Basophils Absolute: 40 {cells}/uL (ref 0–200)
Basophils Relative: 0.5 %
Eosinophils Absolute: 158 {cells}/uL (ref 15–500)
Eosinophils Relative: 2 %
HCT: 28.9 % — ABNORMAL LOW (ref 38.5–50.0)
Hemoglobin: 9.4 g/dL — ABNORMAL LOW (ref 13.2–17.1)
MCH: 33 pg (ref 27.0–33.0)
MCHC: 32.5 g/dL (ref 32.0–36.0)
MCV: 101.4 fL — ABNORMAL HIGH (ref 80.0–100.0)
MPV: 9.7 fL (ref 7.5–12.5)
Monocytes Relative: 9.9 %
Neutro Abs: 6138 {cells}/uL (ref 1500–7800)
Neutrophils Relative %: 77.7 %
Platelets: 245 10*3/uL (ref 140–400)
RBC: 2.85 10*6/uL — ABNORMAL LOW (ref 4.20–5.80)
RDW: 17 % — ABNORMAL HIGH (ref 11.0–15.0)
Total Lymphocyte: 9.9 %
WBC: 7.9 10*3/uL (ref 3.8–10.8)

## 2023-09-10 LAB — COMPLETE METABOLIC PANEL WITHOUT GFR
AG Ratio: 2 (calc) (ref 1.0–2.5)
ALT: 60 U/L — ABNORMAL HIGH (ref 9–46)
AST: 38 U/L — ABNORMAL HIGH (ref 10–35)
Albumin: 3.6 g/dL (ref 3.6–5.1)
Alkaline phosphatase (APISO): 285 U/L — ABNORMAL HIGH (ref 35–144)
BUN/Creatinine Ratio: 14 (calc) (ref 6–22)
BUN: 43 mg/dL — ABNORMAL HIGH (ref 7–25)
CO2: 17 mmol/L — ABNORMAL LOW (ref 20–32)
Calcium: 8.8 mg/dL (ref 8.6–10.3)
Chloride: 109 mmol/L (ref 98–110)
Creat: 3.12 mg/dL — ABNORMAL HIGH (ref 0.70–1.30)
Globulin: 1.8 g/dL — ABNORMAL LOW (ref 1.9–3.7)
Glucose, Bld: 84 mg/dL (ref 65–99)
Potassium: 5.7 mmol/L — ABNORMAL HIGH (ref 3.5–5.3)
Sodium: 136 mmol/L (ref 135–146)
Total Bilirubin: 0.2 mg/dL (ref 0.2–1.2)
Total Protein: 5.4 g/dL — ABNORMAL LOW (ref 6.1–8.1)

## 2023-09-10 LAB — HEMOGLOBIN A1C
Hgb A1c MFr Bld: 5.3 %{Hb} (ref <5.7)
Mean Plasma Glucose: 105 mg/dL
eAG (mmol/L): 5.8 mmol/L

## 2023-09-10 LAB — PSA: PSA: 1.02 ng/mL (ref <=4.00)

## 2023-09-12 DIAGNOSIS — D84821 Immunodeficiency due to drugs: Secondary | ICD-10-CM | POA: Diagnosis not present

## 2023-09-12 DIAGNOSIS — J90 Pleural effusion, not elsewhere classified: Secondary | ICD-10-CM | POA: Diagnosis not present

## 2023-09-12 DIAGNOSIS — N1832 Chronic kidney disease, stage 3b: Secondary | ICD-10-CM | POA: Diagnosis not present

## 2023-09-12 DIAGNOSIS — D849 Immunodeficiency, unspecified: Secondary | ICD-10-CM | POA: Diagnosis not present

## 2023-09-12 DIAGNOSIS — A430 Pulmonary nocardiosis: Secondary | ICD-10-CM | POA: Diagnosis not present

## 2023-09-12 DIAGNOSIS — Z792 Long term (current) use of antibiotics: Secondary | ICD-10-CM | POA: Diagnosis not present

## 2023-09-12 DIAGNOSIS — Z941 Heart transplant status: Secondary | ICD-10-CM | POA: Diagnosis not present

## 2023-09-12 DIAGNOSIS — Z79899 Other long term (current) drug therapy: Secondary | ICD-10-CM | POA: Diagnosis not present

## 2023-09-12 DIAGNOSIS — R911 Solitary pulmonary nodule: Secondary | ICD-10-CM | POA: Diagnosis not present

## 2023-09-12 DIAGNOSIS — I3139 Other pericardial effusion (noninflammatory): Secondary | ICD-10-CM | POA: Diagnosis not present

## 2023-09-12 DIAGNOSIS — Z905 Acquired absence of kidney: Secondary | ICD-10-CM | POA: Diagnosis not present

## 2023-09-12 DIAGNOSIS — R918 Other nonspecific abnormal finding of lung field: Secondary | ICD-10-CM | POA: Diagnosis not present

## 2023-09-25 DIAGNOSIS — F325 Major depressive disorder, single episode, in full remission: Secondary | ICD-10-CM | POA: Diagnosis not present

## 2023-09-25 DIAGNOSIS — E271 Primary adrenocortical insufficiency: Secondary | ICD-10-CM | POA: Diagnosis not present

## 2023-09-25 DIAGNOSIS — D696 Thrombocytopenia, unspecified: Secondary | ICD-10-CM | POA: Diagnosis not present

## 2023-09-25 DIAGNOSIS — I10 Essential (primary) hypertension: Secondary | ICD-10-CM | POA: Diagnosis not present

## 2023-09-25 DIAGNOSIS — R609 Edema, unspecified: Secondary | ICD-10-CM | POA: Diagnosis not present

## 2023-09-25 DIAGNOSIS — Z7982 Long term (current) use of aspirin: Secondary | ICD-10-CM | POA: Diagnosis not present

## 2023-09-25 DIAGNOSIS — D84821 Immunodeficiency due to drugs: Secondary | ICD-10-CM | POA: Diagnosis not present

## 2023-09-25 DIAGNOSIS — K219 Gastro-esophageal reflux disease without esophagitis: Secondary | ICD-10-CM | POA: Diagnosis not present

## 2023-09-25 DIAGNOSIS — E785 Hyperlipidemia, unspecified: Secondary | ICD-10-CM | POA: Diagnosis not present

## 2023-09-26 DIAGNOSIS — Z941 Heart transplant status: Secondary | ICD-10-CM | POA: Diagnosis not present

## 2023-09-26 DIAGNOSIS — D849 Immunodeficiency, unspecified: Secondary | ICD-10-CM | POA: Diagnosis not present

## 2023-09-26 DIAGNOSIS — Z48298 Encounter for aftercare following other organ transplant: Secondary | ICD-10-CM | POA: Diagnosis not present

## 2023-09-30 ENCOUNTER — Other Ambulatory Visit: Payer: Self-pay | Admitting: Family Medicine

## 2023-09-30 NOTE — Telephone Encounter (Signed)
 Copied from CRM [id number]. Topic: Clinical - Medication Refill >> Sep 30, 2023  8:31 AM Donald Frost wrote: Most Recent Primary Care Visit:  Provider: Eliane Grooms T  Department[facility]  Visit Type: OFFICE VISIT  Date: 09/09/2023  Medication: oxyCODONE  (OXY IR/ROXICODONE ) 5 MG immediate release tablet  Has the patient contacted their pharmacy? No   Is this the correct pharmacy for this prescription? Yes If no, delete pharmacy and type the correct one.  This is the patient's preferred pharmacy:  [facility] [address], [place] - [address] [place] [address] Phone: [phone] Fax: [phone]   Has the prescription been filled recently? No  Is the patient out of the medication? No but he only has a few left  Has the patient been seen for an appointment in the last year OR does the patient have an upcoming appointment? Yes  Can we respond through MyChart? No  Please assist patient further

## 2023-09-30 NOTE — Telephone Encounter (Signed)
 Requested medications are due for refill today.  yes  Requested medications are on the active medications list.  yes  Last refill. 09/02/2023 #60 0 rf  Future visit scheduled.   yes  Notes to clinic.  Refill not delegated.    Requested Prescriptions  Pending Prescriptions Disp Refills   oxyCODONE  (OXY IR/ROXICODONE ) 5 MG immediate release tablet 60 tablet 0    Sig: Take 1 tablet (5 mg total) by mouth 3 (three) times daily as needed for severe pain (pain score 7-10).     Not Delegated - Analgesics:  Opioid Agonists Failed - 09/30/2023  5:32 PM      Failed - This refill cannot be delegated      Failed - Urine Drug Screen completed in last 360 days      Passed - Valid encounter within last 3 months    Recent Outpatient Visits           3 weeks ago Type 2 diabetes mellitus with other specified complication, without long-term current use of insulin (HCC)   [place] [facility] Austine Lefort, MD   6 months ago Type 2 diabetes mellitus with other specified complication, without long-term current use of insulin [facility])   [facility] Austine Lefort, MD   1 year ago Type 2 diabetes mellitus with other specified complication, without long-term current use of insulin [facility])   [place] [facility] Pickard, Cisco Crest, MD   1 year ago Tooth impaction   [place] Pickard, Cisco Crest, MD

## 2023-10-01 MED ORDER — OXYCODONE HCL 5 MG PO TABS: 5.0000 mg | ORAL_TABLET | Freq: Three times a day (TID) | ORAL | 0 refills | Status: DC | PRN

## 2023-10-03 DIAGNOSIS — D849 Immunodeficiency, unspecified: Secondary | ICD-10-CM | POA: Diagnosis not present

## 2023-10-03 DIAGNOSIS — N1832 Chronic kidney disease, stage 3b: Secondary | ICD-10-CM | POA: Diagnosis not present

## 2023-10-03 DIAGNOSIS — I129 Hypertensive chronic kidney disease with stage 1 through stage 4 chronic kidney disease, or unspecified chronic kidney disease: Secondary | ICD-10-CM | POA: Diagnosis not present

## 2023-10-03 DIAGNOSIS — Z941 Heart transplant status: Secondary | ICD-10-CM | POA: Diagnosis not present

## 2023-10-03 DIAGNOSIS — R748 Abnormal levels of other serum enzymes: Secondary | ICD-10-CM | POA: Diagnosis not present

## 2023-10-03 DIAGNOSIS — Z4821 Encounter for aftercare following heart transplant: Secondary | ICD-10-CM | POA: Diagnosis not present

## 2023-10-03 DIAGNOSIS — J940 Chylous effusion: Secondary | ICD-10-CM | POA: Diagnosis not present

## 2023-10-03 DIAGNOSIS — I428 Other cardiomyopathies: Secondary | ICD-10-CM | POA: Diagnosis not present

## 2023-10-03 DIAGNOSIS — Z794 Long term (current) use of insulin: Secondary | ICD-10-CM | POA: Diagnosis not present

## 2023-10-03 DIAGNOSIS — E1122 Type 2 diabetes mellitus with diabetic chronic kidney disease: Secondary | ICD-10-CM | POA: Diagnosis not present

## 2023-10-03 DIAGNOSIS — R7401 Elevation of levels of liver transaminase levels: Secondary | ICD-10-CM | POA: Diagnosis not present

## 2023-10-03 DIAGNOSIS — D84821 Immunodeficiency due to drugs: Secondary | ICD-10-CM | POA: Diagnosis not present

## 2023-10-03 DIAGNOSIS — K719 Toxic liver disease, unspecified: Secondary | ICD-10-CM | POA: Diagnosis not present

## 2023-10-03 DIAGNOSIS — A430 Pulmonary nocardiosis: Secondary | ICD-10-CM | POA: Diagnosis not present

## 2023-10-03 DIAGNOSIS — Z792 Long term (current) use of antibiotics: Secondary | ICD-10-CM | POA: Diagnosis not present

## 2023-10-03 DIAGNOSIS — Z48298 Encounter for aftercare following other organ transplant: Secondary | ICD-10-CM | POA: Diagnosis not present

## 2023-10-09 ENCOUNTER — Ambulatory Visit

## 2023-10-09 VITALS — Ht 68.0 in | Wt 143.0 lb

## 2023-10-09 DIAGNOSIS — Z0000 Encounter for general adult medical examination without abnormal findings: Secondary | ICD-10-CM | POA: Diagnosis not present

## 2023-10-09 NOTE — Patient Instructions (Signed)
 Mr. Minchin , Thank you for taking time to come for your Medicare Wellness Visit. I appreciate your ongoing commitment to your health goals. Please review the following plan we discussed and let me know if I can assist you in the future.   Referrals/Orders/Follow-Ups/Clinician Recommendations: Aim for 30 minutes of exercise or brisk walking, 6-8 glasses of water , and 5 servings of fruits and vegetables each day.  This is a list of the screening recommended for you and due dates:  Health Maintenance  Topic Date Due   Zoster (Shingles) Vaccine (1 of 2) Never done   Pneumococcal Vaccination (2 of 2 - PCV) 11/01/2011   Yearly kidney health urinalysis for diabetes  12/06/2020   Eye exam for diabetics  04/26/2023   Complete foot exam   06/16/2023   COVID-19 Vaccine (5 - Moderna risk 2024-25 season) 10/02/2023   Flu Shot  01/10/2024   Hemoglobin A1C  03/10/2024   Yearly kidney function blood test for diabetes  09/08/2024   Medicare Annual Wellness Visit  10/08/2024   DTaP/Tdap/Td vaccine (2 - Td or Tdap) 02/26/2027   Colon Cancer Screening  10/30/2032   Hepatitis C Screening  Completed   HIV Screening  Completed   HPV Vaccine  Aged Out   Meningitis B Vaccine  Aged Out    Advanced directives: (Provided) Advance directive discussed with you today. I have provided a copy for you to complete at home and have notarized. Once this is complete, please bring a copy in to our office so we can scan it into your chart.   Next Medicare Annual Wellness Visit scheduled for next year: Yes

## 2023-10-11 NOTE — Progress Notes (Signed)
 Subjective:   [name] is a 53 y.o. who presents for a Medicare Wellness preventive visit.  Visit Complete: Virtual I connected with  [name] on 10/11/23 by a audio enabled telemedicine application and verified that I am speaking with the correct person using two identifiers.  Patient Location: Home  Provider Location: Home Office  I discussed the limitations of evaluation and management by telemedicine. The patient expressed understanding and agreed to proceed.  Vital Signs: Because this visit was a virtual/telehealth visit, some criteria may be missing or patient reported. Any vitals not documented were not able to be obtained and vitals that have been documented are patient reported.  VideoDeclined- This patient declined Librarian, academic. Therefore the visit was completed with audio only.  Persons Participating in Visit: Patient.  AWV Questionnaire: No: Patient Medicare AWV questionnaire was not completed prior to this visit.  Cardiac Risk Factors include: male gender;diabetes mellitus;dyslipidemia     Objective:    Today's Vitals   10/09/23 1400  Weight: 143 lb (64.9 kg)  Height: 5\' 8"  (1.727 m)   Body mass index is 21.74 kg/m.     10/09/2023    2:24 PM 06/14/2023   11:20 AM 01/15/2023    5:59 AM 10/31/2022    7:47 AM 07/13/2022    9:59 AM 09/03/2021    9:57 AM 07/07/2021   10:38 AM  Advanced Directives  Does Patient Have a Medical Advance Directive? No No Yes No Yes No No  Type of Advance Directive   Living will  Healthcare Power of Attorney    Does patient want to make changes to medical advance directive?   No - Guardian declined      Would patient like information on creating a medical advance directive? Yes (MAU/Ambulatory/Procedural Areas - Information given) No - Patient declined    No - Patient declined No - Patient declined    Current Medications (verified) Outpatient Encounter Medications as of 10/09/2023  Medication  Sig   aspirin  81 MG tablet Take 81 mg by mouth daily.   atovaquone (MEPRON) 750 MG/5ML suspension Take 750 mg by mouth daily.   Blood Glucose Monitoring Suppl DEVI 1 each by Does not apply route in the morning, at noon, and at bedtime. May substitute to any manufacturer covered by patient's insurance.   Calcium  Carb-Cholecalciferol 600-10 MG-MCG TABS Take 1 tablet by mouth in the morning and at bedtime. Lunch and dinner   cetirizine  (ZYRTEC ) 10 MG tablet Take 1 tablet (10 mg total) by mouth daily.   furosemide  (LASIX ) 20 MG tablet Take 40 mg by mouth daily as needed for fluid or edema.   [EXPIRED] Glucose Blood (BLOOD GLUCOSE TEST STRIPS) STRP 1 each by In Vitro route in the morning, at noon, and at bedtime. May substitute to any manufacturer covered by patient's insurance.   insulin regular (NOVOLIN R) 100 units/mL injection Inject into the skin as needed for high blood sugar. Sliding scale: 70-200: NO extra regular insulin, 201-250: Take 2 extra units, 251-300: Take 4 extra units, 301-350: take 6 units, 351-400:take 8 units, greater than 400: take 10 extra units and call your doctor   [EXPIRED] Lancet Device MISC 1 each by Does not apply route in the morning, at noon, and at bedtime. May substitute to any manufacturer covered by patient's insurance.   [EXPIRED] Lancets Misc. MISC 1 each by Does not apply route in the morning, at noon, and at bedtime. May substitute to any manufacturer covered  by patient's insurance.   magnesium  oxide (MAG-OX) 400 MG tablet Take 800 mg by mouth 2 (two) times daily. Lunch and dinner   metFORMIN (GLUCOPHAGE-XR) 500 MG 24 hr tablet Take 1,000 mg by mouth 2 (two) times daily with a meal.   mycophenolate (CELLCEPT) 250 MG capsule Take 250 mg by mouth 2 (two) times daily.   oxyCODONE  (OXY IR/ROXICODONE ) 5 MG immediate release tablet Take 1 tablet (5 mg total) by mouth 3 (three) times daily as needed for severe pain (pain score 7-10).   pantoprazole  (PROTONIX ) 40 MG tablet  Take 40 mg by mouth daily.   predniSONE  (DELTASONE ) 5 MG tablet Take 7.5 mg by mouth daily with breakfast. Pt states takes 10 mg   rosuvastatin (CRESTOR) 10 MG tablet Take 10 mg by mouth daily.   tacrolimus  (PROGRAF ) 1 MG capsule Take 2 mg by mouth 2 (two) times daily.   valGANciclovir (VALCYTE) 450 MG tablet Take 450 mg by mouth 2 (two) times daily.   No facility-administered encounter medications on file as of 10/09/2023.    Allergies (verified) Ibuprofen and Nsaids   History: Past Medical History:  Diagnosis Date   AICD (automatic cardioverter/defibrillator) present    Arthritis    Cardiomyopathy, nonischemic (HCC)    takes Digoxin  daily and Carvedilol  daily   CHF (congestive heart failure) (HCC)    takes Lasix  daily as well Aldactone    Chronic back pain    Chronic systolic heart failure (HCC)    Cirrhosis (HCC)    Depression    takes Prozac  daily   GERD (gastroesophageal reflux disease)    takes Omeprazole  daily   Hx of Alcohol consumption heavy    rare beer currently   hx of Tobacco abuse    quit smoking 2014   Hyperlipidemia    was on Simvastatin  but has been off a year   Insomnia    takes Ambien  as needed(just got script yesterday)   Orthostatic hypotension    Scoliosis    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 70 (XRT and chemo)   Past Surgical History:  Procedure Laterality Date   BIOPSY  10/31/2022   Procedure: BIOPSY;  Surgeon: Ace Holder, MD;  Location: [facility] ENDOSCOPY;  Service: Gastroenterology;;   Ritta Chessman RELEASE Right 01/09/2013   Procedure: CARPAL TUNNEL RELEASE;  Surgeon: Pasty Bongo, MD;  Location: [facility] NEURO ORS;  Service: Neurosurgery;  Laterality: Right;  RIGHT carpal tunnel release   CARPAL TUNNEL RELEASE Left 01/30/2013   Procedure: LEFT CARPAL TUNNEL RELEASE;  Surgeon: Pasty Bongo, MD;  Location: [facility] NEURO ORS;  Service: Neurosurgery;  Laterality: Left;  LEFT Carpal Tunnel release   CHEST TUBE INSERTION Right 09/09/2013   Procedure:  INSERTION PLEURAL DRAINAGE CATHETER;  Surgeon: Heriberto London, MD;  Location[facility] OR;  Service: Thoracic;  Laterality: Right;   COLONOSCOPY WITH PROPOFOL  N/A 10/31/2022   Procedure: COLONOSCOPY WITH PROPOFOL ;  Surgeon: Ace Holder, MD;  Location: [facility] END[facility]COPY;  Service: Gastroenterology;  Laterality: N/A;   HYDROCELE EXCISION Right 04/11/2017   Procedure: RIGHT HYDROCELECTOMY ADULT;  Surgeon: Homero Luster, MD;  Location: [facility] ORS;  Service: Urology;  Laterality: Right;   hydrocelectomy  2008   ICD  05/25/2011   [facility] [employer] SG lead/[employer]nergen single chamber device   IMPLANTABLE CARDIOVERTER DEFIBRILLATOR IMPLANT N/A 05/25/2011   Procedure: IMPLANTABLE CARDIOVERTER DEFIBRILLATOR IMPLANT;  Surgeon: Tammie Fall, MD;  Location[facility] C[facility] LAB;  Service: Cardiovascular;  Laterality: N/A;   IR FLUORO GUIDE  CV LINE RIGHT  01/16/2023   IR US  GUIDE VASC ACCESS RIGHT  01/16/2023   NEPHRECTOMY  36 yrs ago   Wilms Tumor   POLYPECTOMY  10/31/2022   Procedure: POLYPECTOMY;  Surgeon: Ace Holder, MD;  Location: [facility] ENDOSCOPY;  Service: Gastroenterology;;   RIGHT HEART CATH N/A 01/29/2019   Procedure: RIGHT HEART CATH;  Surgeon: Mardell Shade, MD;  Location: [facility] INVASIVE CV LAB;  Service: Cardiovascular;  Laterality: N/A;   RIGHT HEART CATH N/A 09/08/2019   Procedure: RIGHT HEART CATH;  Surgeon: Mardell Shade, MD;  Location: [facility] INVASIVE CV LAB;  Service: Cardiovascular;  Laterality: N/A;   RIGHT HEART CATH N/A 01/15/2023   Procedure: RIGHT HEART CATH;  Surgeon: Mardell Shade, MD;  Location: [facility] INVASIVE CV LAB;  Service: Cardiovascular;  Laterality: N/A;   RIGHT/LEFT HEART CATH AND CORONARY ANGIOGRAPHY N/A 02/12/2017   Procedure: RIGHT/LEFT HEART CATH AND CORONARY ANGIOGRAPHY;  Surgeon: Mardell Shade, MD;  Location: [facility] INVASIVE CV LAB;  Service: Cardiovascular;  Laterality: N/A;   ULNAR NERVE TRANSPOSITION Right 01/09/2013   Procedure: ULNAR NERVE  DECOMPRESSION/TRANSPOSITION;  Surgeon: Pasty Bongo, MD;  Location: [facility] NEURO ORS;  Service: Neurosurgery;  Laterality: Right;  RIGHT ulnar nerve decompression   Family History  Problem Relation Age of Onset   Diabetes Mother    Colon cancer Neg Hx    Stomach cancer Neg Hx    Esophageal cancer Neg Hx    Social History   Socioeconomic History   Marital status: Single    Spouse name: Not on file   Number of children: 1   Years of education: Not on file   Highest education level: Not on file  Occupational History   Occupation: Full time    Comment: Engineer, petroleum  Tobacco Use   Smoking status: Former    Current packs/day: 0.00    Average packs/day: 0.3 packs/day for 20.0 years (5.0 ttl pk-yrs)    Types: Cigarettes    Start date: 09/07/1992    Quit date: 09/07/2012    Years since quitting: 11.0   Smokeless tobacco: Current    Types: Snuff  Vaping Use   Vaping status: Never Used  Substance and Sexual Activity   Alcohol use: Yes    Alcohol/week: 0.0 standard drinks of alcohol    Comment: histoorically a heavy drinker ("beer only"), none for a couple months    Drug use: No   Sexual activity: Not Currently  Other Topics Concern   Not on file  Social History Narrative   Not on file   Social Drivers of Health   Financial Resource Strain: Low Risk  (10/09/2023)   Overall Financial Resource Strain (CARDIA)    Difficulty of Paying Living Expenses: Not hard at all  Food Insecurity: No Food Insecurity (10/09/2023)   Hunger Vital Sign    Worried About Running Out of Food in the Last Year: Never true    Ran Out of Food in the Last Year: Never true  Transportation Needs: No Transportation Needs (10/09/2023)   PRAPARE - Administrator, Civil Service (Medical): No    Lack of Transportation (Non-Medical): No  Physical Activity: Inactive (10/09/2023)   Exercise Vital Sign    Days of Exercise per Week: 0 days    Minutes of Exercise per Session: 0 min  Stress: No Stress  Concern Present (10/09/2023)   [employer] of Occupational Health - Occupational Stress Questionnaire    Feeling of Stress : Not  at all  Social Connections: Moderately Isolated (10/09/2023)   Social Connection and Isolation Panel [NHANES]    Frequency of Communication with Friends and Family: Three times a week    Frequency of Social Gatherings with Friends and Family: Three times a week    Attends Religious Services: 1 to 4 times per year    Active Member of Clubs or Organizations: No    Attends Banker Meetings: Never    Marital Status: Never married    Tobacco Counseling Ready to quit: Not Answered Counseling given: Not Answered    Clinical Intake:  Pre-visit preparation completed: Yes  Pain : No/denies pain     Diabetes: Yes CBG done?: No Did pt. bring in CBG monitor from home?: No  Lab Results  Component Value Date   HGBA1C 5.3 09/09/2023   HGBA1C 6.6 (H) 06/15/2022   HGBA1C 6.4 (H) 05/30/2021     How often do you need to have someone help you when you read instructions, pamphlets, or other written materials from your doctor or pharmacy?: 1 - Never  Interpreter Needed?: No  Information entered by :: Seabron Cypress LPN   Activities of Daily Living     10/09/2023    2:05 PM 01/15/2023   11:29 AM  In your present state of health, do you have any difficulty performing the following activities:  Hearing? 0 0  Vision? 0 0  Difficulty concentrating or making decisions? 0 0  Walking or climbing stairs? 0 0  Dressing or bathing? 0 0  Doing errands, shopping? 0 0  Preparing Food and eating ? N   Using the Toilet? N   In the past six months, have you accidently leaked urine? N   Do you have problems with loss of bowel control? N   Managing your Medications? N   Managing your Finances? N   Housekeeping or managing your Housekeeping? N     Patient Care Team: Austine Lefort, MD as PCP - General (Family Medicine) [name], Rheta Celestine, MD as  PCP - Cardiology (Cardiology) Tammie Fall, MD as PCP - Electrophysiology (Cardiology) Tammie Fall, MD as Consulting Physician (Cardiology)  Indicate any recent Medical Services you may have received from other than [facility] providers in the past year (date may be approximate).     Assessment:   This is a routine wellness examination for Toren.  Hearing/Vision screen Hearing Screening - Comments:: Denies hearing difficulties   Vision Screening - Comments::  up to date with routine eye exams    Goals Addressed   None    Depression Screen     10/09/2023    2:23 PM 07/13/2022    9:58 AM 06/15/2022    8:07 AM 07/07/2021   10:35 AM 11/08/2020   10:10 AM 07/11/2020    2:07 PM 04/08/2020   10:17 AM  PHQ 2/9 Scores  PHQ - 2 Score 0 0 0 0 0 0 0    Fall Risk     10/09/2023    2:25 PM 06/15/2022    8:07 AM 07/07/2021   10:38 AM 11/08/2020   10:10 AM 07/11/2020    2:07 PM  Fall Risk   Falls in the past year? 0 0 0 1 1  Number falls in past yr: 0 0 0 0 0  Comment     slpped on ice  Injury with Fall? 0 0 0 0 0  Risk for fall due to : No Fall Risks No Fall Risks No  Fall Risks No Fall Risks No Fall Risks  Follow up Falls prevention discussed;Education provided;Falls evaluation completed Falls prevention discussed Falls prevention discussed Falls evaluation completed Falls evaluation completed    MEDICARE RISK AT HOME:  Medicare Risk at Home Any stairs in or around the home?: No If so, are there any without handrails?: No Home free of loose throw rugs in walkways, pet beds, electrical cords, etc?: Yes Adequate lighting in your home to reduce risk of falls?: Yes Life alert?: No Use of a cane, walker or w/c?: No Grab bars in the bathroom?: Yes Shower chair or bench in shower?: No Elevated toilet seat or a handicapped toilet?: Yes  TIMED UP AND GO:  Was the test performed?  No  Cognitive Function: 6CIT completed        10/09/2023    2:25 PM 07/13/2022   10:01 AM 07/07/2021    10:40 AM 03/05/2018    3:07 PM  6CIT Screen  What Year? 0 points 0 points 0 points 0 points  What month? 0 points 0 points 0 points 0 points  What time? 0 points 0 points 0 points   Count back from 20 0 points 0 points 0 points 0 points  Months in reverse 0 points 0 points 0 points 2 points  Repeat phrase 0 points 0 points 0 points 2 points  Total Score 0 points 0 points 0 points     Immunizations Immunization History  Administered Date(s) Administered   Hep A / Hep B 03/30/2019, 02/18/2020, 03/21/2020, 08/22/2020   Influenza Split 05/26/2011, 02/29/2012   Influenza,inj,Quad PF,6+ Mos 03/16/2014, 03/18/2015, 04/25/2016, 02/25/2017, 03/05/2018, 03/30/2019, 04/08/2020, 05/30/2021   Influenza-Unspecified 03/16/2014, 03/18/2015, 04/25/2016, 02/25/2017, 03/05/2018, 03/30/2019, 02/23/2022   Moderna Covid-19 Fall Seasonal Vaccine 62yrs & older 04/03/2023   Moderna Sars-Covid-2 Vaccination 08/16/2019, 09/23/2019, 05/23/2020   Pneumococcal Polysaccharide-23 11/01/2010   Tdap 02/25/2017    Screening Tests Health Maintenance  Topic Date Due   Zoster Vaccines- Shingrix (1 of 2) Never done   Pneumococcal Vaccine 53-85 Years old (2 of 2 - PCV) 11/01/2011   Diabetic kidney evaluation - Urine ACR  12/06/2020   OPHTHALMOLOGY EXAM  04/26/2023   FOOT EXAM  06/16/2023   COVID-19 Vaccine (5 - Moderna risk 2024-25 season) 10/02/2023   INFLUENZA VACCINE  01/10/2024   HEMOGLOBIN A1C  03/10/2024   Diabetic kidney evaluation - eGFR measurement  09/08/2024   Medicare Annual Wellness (AWV)  10/08/2024   DTaP/Tdap/Td (2 - Td or Tdap) 02/26/2027   Colonoscopy  10/30/2032   Hepatitis C Screening  Completed   HIV Screening  Completed   HPV VACCINES  Aged Out   Meningococcal B Vaccine  Aged Out    Health Maintenance  Health Maintenance Due  Topic Date Due   Zoster Vaccines- Shingrix (1 of 2) Never done   Pneumococcal Vaccine 26-53 Years old (2 of 2 - PCV) 11/01/2011   Diabetic kidney evaluation -  Urine ACR  12/06/2020   OPHTHALMOLOGY EXAM  04/26/2023   FOOT EXAM  06/16/2023   COVID-19 Vaccine (5 - Moderna risk 2024-25 season) 10/02/2023    Additional Screening:  Vision Screening: Recommended annual ophthalmology exams for early detection of glaucoma and other disorders of the eye.  Dental Screening: Recommended annual dental exams for proper oral hygiene  Community Resource Referral / Chronic Care Management: CRR required this visit?  No   CCM required this visit?  No     Plan:     I have personally reviewed and noted  the following in the patient's chart:   Medical and social history Use of alcohol, tobacco or illicit drugs  Current medications and supplements including opioid prescriptions. Patient is not currently taking opioid prescriptions. Functional ability and status Nutritional status Physical activity Advanced directives List of other physicians Hospitalizations, surgeries, and ER visits in previous 12 months Vitals Screenings to include cognitive, depression, and falls Referrals and appointments  In addition, I have reviewed and discussed with patient certain preventive protocols, quality metrics, and best practice recommendations. A written personalized care plan for preventive services as well as general preventive health recommendations were provided to patient.     Seabron Cypress [place], [place]   12/17/4694   After Visit Summary: (MyChart) Due to this being a telephonic visit, the after visit summary with patients personalized plan was offered to patient via MyChart   Notes: Nothing significant to report at this time.

## 2023-10-11 NOTE — Progress Notes (Signed)
 Not sure how I missed that.  Its been updated now   Seabron Cypress, LPN [facility] Health Advisor [place] [facility] You Are. We Are. One [facility] Direct Dial [phone]

## 2023-10-16 DIAGNOSIS — M1611 Unilateral primary osteoarthritis, right hip: Secondary | ICD-10-CM | POA: Diagnosis not present

## 2023-10-16 DIAGNOSIS — M87051 Idiopathic aseptic necrosis of right femur: Secondary | ICD-10-CM | POA: Diagnosis not present

## 2023-10-16 DIAGNOSIS — Z941 Heart transplant status: Secondary | ICD-10-CM | POA: Diagnosis not present

## 2023-10-16 DIAGNOSIS — M00151 Pneumococcal arthritis, right hip: Secondary | ICD-10-CM | POA: Insufficient documentation

## 2023-10-17 DIAGNOSIS — N1832 Chronic kidney disease, stage 3b: Secondary | ICD-10-CM | POA: Diagnosis not present

## 2023-10-17 DIAGNOSIS — D849 Immunodeficiency, unspecified: Secondary | ICD-10-CM | POA: Diagnosis not present

## 2023-10-17 DIAGNOSIS — Z941 Heart transplant status: Secondary | ICD-10-CM | POA: Diagnosis not present

## 2023-10-17 DIAGNOSIS — A430 Pulmonary nocardiosis: Secondary | ICD-10-CM | POA: Diagnosis not present

## 2023-10-17 DIAGNOSIS — Z79899 Other long term (current) drug therapy: Secondary | ICD-10-CM | POA: Diagnosis not present

## 2023-10-17 DIAGNOSIS — K719 Toxic liver disease, unspecified: Secondary | ICD-10-CM | POA: Diagnosis not present

## 2023-10-17 DIAGNOSIS — Z792 Long term (current) use of antibiotics: Secondary | ICD-10-CM | POA: Diagnosis not present

## 2023-10-29 NOTE — Telephone Encounter (Unsigned)
 Copied from CRM [id number]. Topic: Clinical - Medication Refill >> Oct 29, 2023 10:29 AM Marissa P wrote: Medication: oxyCODONE  (OXY IR/ROXICODONE ) 5 MG immediate release tablet  Has the patient contacted their pharmacy? No (Agent: If no, request that the patient contact the pharmacy for the refill. If patient does not wish to contact the pharmacy document the reason why and proceed with request.) (Agent: If yes, when and what did the pharmacy advise?)  This is the patient's preferred pharmacy:  [facility] [address] [place] - [address] [place] [address] Phone: [phone] Fax: [phone]  Is this the correct pharmacy for this prescription? Yes If no, delete pharmacy and type the correct one.   Has the prescription been filled recently? Yes  Is the patient out of the medication? Yes  Has the patient been seen for an appointment in the last year OR does the patient have an upcoming appointment? Yes  Can we respond through MyChart? Yes  Agent: Please be advised that Rx refills may take up to 3 business days. We ask that you follow-up with your pharmacy.

## 2023-10-30 DIAGNOSIS — Z941 Heart transplant status: Secondary | ICD-10-CM | POA: Diagnosis not present

## 2023-11-05 ENCOUNTER — Other Ambulatory Visit: Payer: Self-pay | Admitting: Family Medicine

## 2023-11-05 ENCOUNTER — Telehealth: Payer: Self-pay

## 2023-11-05 MED ORDER — OXYCODONE HCL 5 MG PO TABS: 5.0000 mg | ORAL_TABLET | Freq: Three times a day (TID) | ORAL | 0 refills | Status: DC | PRN

## 2023-11-05 NOTE — Telephone Encounter (Signed)
 Copied from CRM [id number]. Topic: Clinical - Medication Question >> Nov 05, 2023  8:12 AM [name] wrote: Reason for CRM: Patient called in to ask about the status of medication oxyCODONE  (OXY IR/ROXICODONE ) 5 MG immediate release tablet refill being sent to pharmacy for him.Stated that he ran out over the weekend.

## 2023-11-13 DIAGNOSIS — Z941 Heart transplant status: Secondary | ICD-10-CM | POA: Diagnosis not present

## 2023-11-19 ENCOUNTER — Telehealth: Payer: Self-pay | Admitting: Family Medicine

## 2023-11-19 DIAGNOSIS — Z941 Heart transplant status: Secondary | ICD-10-CM | POA: Diagnosis not present

## 2023-11-19 DIAGNOSIS — M87051 Idiopathic aseptic necrosis of right femur: Secondary | ICD-10-CM | POA: Diagnosis not present

## 2023-11-19 DIAGNOSIS — Z48298 Encounter for aftercare following other organ transplant: Secondary | ICD-10-CM | POA: Diagnosis not present

## 2023-11-19 NOTE — Telephone Encounter (Signed)
 Preop clearance form for [facility] completed and signed by provider. Successfully faxed to them with a confirmation time stamp of Jun/03/2024 2:52:15.  Patient advised via telephone call; stated he doesn't need a copy of it.

## 2023-11-21 DIAGNOSIS — D84821 Immunodeficiency due to drugs: Secondary | ICD-10-CM | POA: Diagnosis not present

## 2023-11-21 DIAGNOSIS — K7460 Unspecified cirrhosis of liver: Secondary | ICD-10-CM | POA: Diagnosis not present

## 2023-11-21 DIAGNOSIS — A439 Nocardiosis, unspecified: Secondary | ICD-10-CM | POA: Diagnosis not present

## 2023-11-21 DIAGNOSIS — B259 Cytomegaloviral disease, unspecified: Secondary | ICD-10-CM | POA: Diagnosis not present

## 2023-11-21 DIAGNOSIS — D61818 Other pancytopenia: Secondary | ICD-10-CM | POA: Diagnosis not present

## 2023-11-21 DIAGNOSIS — D72819 Decreased white blood cell count, unspecified: Secondary | ICD-10-CM | POA: Diagnosis not present

## 2023-11-21 DIAGNOSIS — D849 Immunodeficiency, unspecified: Secondary | ICD-10-CM | POA: Diagnosis not present

## 2023-11-21 DIAGNOSIS — A430 Pulmonary nocardiosis: Secondary | ICD-10-CM | POA: Diagnosis not present

## 2023-11-21 DIAGNOSIS — Z905 Acquired absence of kidney: Secondary | ICD-10-CM | POA: Diagnosis not present

## 2023-11-21 DIAGNOSIS — E1122 Type 2 diabetes mellitus with diabetic chronic kidney disease: Secondary | ICD-10-CM | POA: Diagnosis not present

## 2023-11-21 DIAGNOSIS — I071 Rheumatic tricuspid insufficiency: Secondary | ICD-10-CM | POA: Diagnosis not present

## 2023-11-21 DIAGNOSIS — Z941 Heart transplant status: Secondary | ICD-10-CM | POA: Diagnosis not present

## 2023-11-21 DIAGNOSIS — Z792 Long term (current) use of antibiotics: Secondary | ICD-10-CM | POA: Diagnosis not present

## 2023-11-21 DIAGNOSIS — N179 Acute kidney failure, unspecified: Secondary | ICD-10-CM | POA: Diagnosis not present

## 2023-11-21 DIAGNOSIS — J189 Pneumonia, unspecified organism: Secondary | ICD-10-CM | POA: Diagnosis not present

## 2023-11-21 DIAGNOSIS — N183 Chronic kidney disease, stage 3 unspecified: Secondary | ICD-10-CM | POA: Diagnosis not present

## 2023-11-21 DIAGNOSIS — D696 Thrombocytopenia, unspecified: Secondary | ICD-10-CM | POA: Diagnosis not present

## 2023-11-21 DIAGNOSIS — D638 Anemia in other chronic diseases classified elsewhere: Secondary | ICD-10-CM | POA: Diagnosis not present

## 2023-11-21 DIAGNOSIS — E1169 Type 2 diabetes mellitus with other specified complication: Secondary | ICD-10-CM | POA: Diagnosis not present

## 2023-11-21 DIAGNOSIS — N189 Chronic kidney disease, unspecified: Secondary | ICD-10-CM | POA: Diagnosis not present

## 2023-11-21 DIAGNOSIS — R918 Other nonspecific abnormal finding of lung field: Secondary | ICD-10-CM | POA: Diagnosis not present

## 2023-11-21 DIAGNOSIS — E8809 Other disorders of plasma-protein metabolism, not elsewhere classified: Secondary | ICD-10-CM | POA: Diagnosis not present

## 2023-11-21 DIAGNOSIS — J90 Pleural effusion, not elsewhere classified: Secondary | ICD-10-CM | POA: Diagnosis not present

## 2023-11-23 DIAGNOSIS — E8809 Other disorders of plasma-protein metabolism, not elsewhere classified: Secondary | ICD-10-CM | POA: Insufficient documentation

## 2023-11-23 DIAGNOSIS — D72819 Decreased white blood cell count, unspecified: Secondary | ICD-10-CM | POA: Insufficient documentation

## 2023-11-25 DIAGNOSIS — A430 Pulmonary nocardiosis: Secondary | ICD-10-CM | POA: Diagnosis not present

## 2023-11-25 DIAGNOSIS — J90 Pleural effusion, not elsewhere classified: Secondary | ICD-10-CM | POA: Diagnosis not present

## 2023-11-25 DIAGNOSIS — J189 Pneumonia, unspecified organism: Secondary | ICD-10-CM | POA: Diagnosis not present

## 2023-11-25 DIAGNOSIS — B259 Cytomegaloviral disease, unspecified: Secondary | ICD-10-CM | POA: Diagnosis not present

## 2023-11-25 DIAGNOSIS — Z941 Heart transplant status: Secondary | ICD-10-CM | POA: Diagnosis not present

## 2023-11-28 DIAGNOSIS — Z941 Heart transplant status: Secondary | ICD-10-CM | POA: Diagnosis not present

## 2023-11-30 DIAGNOSIS — A430 Pulmonary nocardiosis: Secondary | ICD-10-CM | POA: Diagnosis not present

## 2023-11-30 DIAGNOSIS — J90 Pleural effusion, not elsewhere classified: Secondary | ICD-10-CM | POA: Diagnosis not present

## 2023-11-30 DIAGNOSIS — B259 Cytomegaloviral disease, unspecified: Secondary | ICD-10-CM | POA: Diagnosis not present

## 2023-11-30 DIAGNOSIS — Z941 Heart transplant status: Secondary | ICD-10-CM | POA: Diagnosis not present

## 2023-11-30 DIAGNOSIS — J189 Pneumonia, unspecified organism: Secondary | ICD-10-CM | POA: Diagnosis not present

## 2023-12-02 ENCOUNTER — Other Ambulatory Visit: Payer: Self-pay | Admitting: Family Medicine

## 2023-12-02 NOTE — Telephone Encounter (Signed)
 Copied from CRM [id number]. Topic: Clinical - Medication Refill >> Dec 02, 2023  9:25 AM Tiffany B wrote: Medication: oxyCODONE  (OXY IR/ROXICODONE ) 5 MG immediate release tablet  Has the patient contacted their pharmacy? No. Patient states he was advised to call PCP office for this medication    This is the patient's preferred pharmacy:  [facility] [address] [place] - [address] [place] [address] Phone: [phone] Fax: [phone]  Is this the correct pharmacy for this prescription? No  Has the prescription been filled recently? Yes   Is the patient out of the medication? No  Has the patient been seen for an appointment in the last year OR does the patient have an upcoming appointment? No  Can we respond through MyChart? No  Agent: Please be advised that Rx refills may take up to 3 business days. We ask that you follow-up with your pharmacy.

## 2023-12-03 NOTE — Telephone Encounter (Signed)
 Requested medications are due for refill today.  yes  Requested medications are on the active medications list.  yes  Last refill. 11/05/2023 #[id number] 0 rf  Future visit scheduled.   yes  Notes to clinic.  Refill not delegated.    Requested Prescriptions  Pending Prescriptions Disp Refills   oxyCODONE  (OXY IR/ROXICODONE ) 5 MG immediate release tablet 60 tablet 0    Sig: Take 1 tablet (5 mg total) by mouth 3 (three) times daily as needed for severe pain (pain score 7-10).     Not Delegated - Analgesics:  Opioid Agonists Failed - 12/03/2023  4:52 PM      Failed - This refill cannot be delegated      Failed - Urine Drug Screen completed in last 360 days      Passed - Valid encounter within last 3 months    Recent Outpatient Visits           2 months ago Type 2 diabetes mellitus with other specified complication, without long-term current use of insulin (HCC)   [facility] Duanne Butler DASEN, MD   8 months ago Type 2 diabetes mellitus with other specified complication, without long-term current use of insulin [facility])   [place] [facility] Duanne Butler DASEN, MD   1 year ago Type 2 diabetes mellitus with other specified complication, without long-term current use of insulin [facility])   [place] [facility] Pickard, Butler DASEN, MD   1 year ago Tooth impaction   [place] [facility] Pickard, Butler DASEN, MD

## 2023-12-05 DIAGNOSIS — Z941 Heart transplant status: Secondary | ICD-10-CM | POA: Diagnosis not present

## 2023-12-05 DIAGNOSIS — J189 Pneumonia, unspecified organism: Secondary | ICD-10-CM | POA: Diagnosis not present

## 2023-12-05 DIAGNOSIS — B259 Cytomegaloviral disease, unspecified: Secondary | ICD-10-CM | POA: Diagnosis not present

## 2023-12-05 MED ORDER — OXYCODONE HCL 5 MG PO TABS: 5.0000 mg | ORAL_TABLET | Freq: Three times a day (TID) | ORAL | 0 refills | Status: DC | PRN

## 2023-12-07 DIAGNOSIS — J189 Pneumonia, unspecified organism: Secondary | ICD-10-CM | POA: Diagnosis not present

## 2023-12-07 DIAGNOSIS — A430 Pulmonary nocardiosis: Secondary | ICD-10-CM | POA: Diagnosis not present

## 2023-12-07 DIAGNOSIS — Z941 Heart transplant status: Secondary | ICD-10-CM | POA: Diagnosis not present

## 2023-12-07 DIAGNOSIS — J90 Pleural effusion, not elsewhere classified: Secondary | ICD-10-CM | POA: Diagnosis not present

## 2023-12-07 DIAGNOSIS — B259 Cytomegaloviral disease, unspecified: Secondary | ICD-10-CM | POA: Diagnosis not present

## 2023-12-12 DIAGNOSIS — J189 Pneumonia, unspecified organism: Secondary | ICD-10-CM | POA: Diagnosis not present

## 2023-12-12 DIAGNOSIS — Z941 Heart transplant status: Secondary | ICD-10-CM | POA: Diagnosis not present

## 2023-12-12 DIAGNOSIS — B259 Cytomegaloviral disease, unspecified: Secondary | ICD-10-CM | POA: Diagnosis not present

## 2023-12-14 DIAGNOSIS — B259 Cytomegaloviral disease, unspecified: Secondary | ICD-10-CM | POA: Diagnosis not present

## 2023-12-14 DIAGNOSIS — Z941 Heart transplant status: Secondary | ICD-10-CM | POA: Diagnosis not present

## 2023-12-14 DIAGNOSIS — J189 Pneumonia, unspecified organism: Secondary | ICD-10-CM | POA: Diagnosis not present

## 2023-12-14 DIAGNOSIS — J90 Pleural effusion, not elsewhere classified: Secondary | ICD-10-CM | POA: Diagnosis not present

## 2023-12-14 DIAGNOSIS — A430 Pulmonary nocardiosis: Secondary | ICD-10-CM | POA: Diagnosis not present

## 2023-12-19 DIAGNOSIS — D849 Immunodeficiency, unspecified: Secondary | ICD-10-CM | POA: Diagnosis not present

## 2023-12-19 DIAGNOSIS — N1832 Chronic kidney disease, stage 3b: Secondary | ICD-10-CM | POA: Diagnosis not present

## 2023-12-19 DIAGNOSIS — M87051 Idiopathic aseptic necrosis of right femur: Secondary | ICD-10-CM | POA: Diagnosis not present

## 2023-12-19 DIAGNOSIS — Z7984 Long term (current) use of oral hypoglycemic drugs: Secondary | ICD-10-CM | POA: Diagnosis not present

## 2023-12-19 DIAGNOSIS — B259 Cytomegaloviral disease, unspecified: Secondary | ICD-10-CM | POA: Diagnosis not present

## 2023-12-19 DIAGNOSIS — I129 Hypertensive chronic kidney disease with stage 1 through stage 4 chronic kidney disease, or unspecified chronic kidney disease: Secondary | ICD-10-CM | POA: Diagnosis not present

## 2023-12-19 DIAGNOSIS — A430 Pulmonary nocardiosis: Secondary | ICD-10-CM | POA: Diagnosis not present

## 2023-12-19 DIAGNOSIS — A439 Nocardiosis, unspecified: Secondary | ICD-10-CM | POA: Insufficient documentation

## 2023-12-19 DIAGNOSIS — Z4821 Encounter for aftercare following heart transplant: Secondary | ICD-10-CM | POA: Diagnosis not present

## 2023-12-19 DIAGNOSIS — D61818 Other pancytopenia: Secondary | ICD-10-CM | POA: Diagnosis not present

## 2023-12-19 DIAGNOSIS — Z79899 Other long term (current) drug therapy: Secondary | ICD-10-CM | POA: Diagnosis not present

## 2023-12-19 DIAGNOSIS — Z905 Acquired absence of kidney: Secondary | ICD-10-CM | POA: Diagnosis not present

## 2023-12-19 DIAGNOSIS — D84821 Immunodeficiency due to drugs: Secondary | ICD-10-CM | POA: Diagnosis not present

## 2023-12-19 DIAGNOSIS — Z941 Heart transplant status: Secondary | ICD-10-CM | POA: Diagnosis not present

## 2023-12-19 DIAGNOSIS — R7989 Other specified abnormal findings of blood chemistry: Secondary | ICD-10-CM | POA: Diagnosis not present

## 2023-12-19 DIAGNOSIS — Z48298 Encounter for aftercare following other organ transplant: Secondary | ICD-10-CM | POA: Diagnosis not present

## 2023-12-19 DIAGNOSIS — B258 Other cytomegaloviral diseases: Secondary | ICD-10-CM | POA: Diagnosis not present

## 2023-12-19 DIAGNOSIS — I071 Rheumatic tricuspid insufficiency: Secondary | ICD-10-CM | POA: Diagnosis not present

## 2023-12-19 DIAGNOSIS — Z792 Long term (current) use of antibiotics: Secondary | ICD-10-CM | POA: Diagnosis not present

## 2023-12-19 DIAGNOSIS — N183 Chronic kidney disease, stage 3 unspecified: Secondary | ICD-10-CM | POA: Diagnosis not present

## 2023-12-19 DIAGNOSIS — E1122 Type 2 diabetes mellitus with diabetic chronic kidney disease: Secondary | ICD-10-CM | POA: Diagnosis not present

## 2023-12-26 NOTE — Progress Notes (Signed)
 Sent message, via epic in basket, requesting orders in epic from Careers adviser.

## 2023-12-27 NOTE — Patient Instructions (Addendum)
 SURGICAL WAITING ROOM VISITATION  Patients having surgery or a procedure may have no more than 2 support people in the waiting area - these visitors may rotate.    Children under the age of 41 must have an adult with them who is not the patient.  Visitors with respiratory illnesses are discouraged from visiting and should remain at home.  If the patient needs to stay at the hospital during part of their recovery, the visitor guidelines for inpatient rooms apply. Pre-op nurse will coordinate an appropriate time for 1 support person to accompany patient in pre-op.  This support person may not rotate.    Please refer to the [facility] website for the visitor guidelines for Inpatients (after your surgery is over and you are in a regular room).       Your procedure is scheduled on: 01/08/24   Report to [facility] Main Entrance    Report to admitting at : 11:50 AM   Call this number if you have problems the morning of surgery [phone]   Do not eat food :After Midnight.   After Midnight you may have the following liquids until : 11:20 AM DAY OF SURGERY  Water  Non-Citrus Juices (without pulp, NO RED-Apple, White grape, White cranberry) Black Coffee (NO MILK/CREAM OR CREAMERS, sugar ok)  Clear Tea (NO MILK/CREAM OR CREAMERS, sugar ok) regular and decaf                             Plain Jell-O (NO RED)                                           Fruit ices (not with fruit pulp, NO RED)                                     Popsicles (NO RED)                                                               Sports drinks like Gatorade (NO RED)  FOLLOW ANY ADDITIONAL PRE OP INSTRUCTIONS YOU RECEIVED FROM YOUR SURGEON'S OFFICE!!!   Oral Hygiene is also important to reduce your risk of infection.                                    Remember - BRUSH YOUR TEETH THE MORNING OF SURGERY WITH YOUR REGULAR TOOTHPASTE  DENTURES WILL BE REMOVED PRIOR TO SURGERY PLEASE DO NOT APPLY Poly grip  OR ADHESIVES!!!   Do NOT smoke after Midnight   Stop all vitamins and herbal supplements 7 days before surgery.   Take these medicines the morning of surgery with A SIP OF WATER : valganciclovir,Cellcept,prograf ,labetalol ,prednisone ,azithromycin ,mepron,pantoprazole .  How to Manage Your Diabetes Before and After Surgery  Why is it important to control my blood sugar before and after surgery? Improving blood sugar levels before and after surgery helps healing and can limit problems. A way of improving blood sugar control is eating a healthy diet  by:  Eating less sugar and carbohydrates  Increasing activity/exercise  Talking with your doctor about reaching your blood sugar goals High blood sugars (greater than 180 mg/dL) can raise your risk of infections and slow your recovery, so you will need to focus on controlling your diabetes during the weeks before surgery. Make sure that the doctor who takes care of your diabetes knows about your planned surgery including the date and location.  How do I manage my blood sugar before surgery? Check your blood sugar at least 4 times a day, starting 2 days before surgery, to make sure that the level is not too high or low. Check your blood sugar the morning of your surgery when you wake up and every 2 hours until you get to the Short Stay unit. If your blood sugar is less than 70 mg/dL, you will need to treat for low blood sugar: Do not take insulin. Treat a low blood sugar (less than 70 mg/dL) with  cup of clear juice (cranberry or apple), 4 glucose tablets, OR glucose gel. Recheck blood sugar in 15 minutes after treatment (to make sure it is greater than 70 mg/dL). If your blood sugar is not greater than 70 mg/dL on recheck, call [phone] for further instructions. Report your blood sugar to the short stay nurse when you get to Short Stay.  If you are admitted to the hospital after surgery: Your blood sugar will be checked by the staff and you  will probably be given insulin after surgery (instead of oral diabetes medicines) to make sure you have good blood sugar levels. The goal for blood sugar control after surgery is 80-180 mg/dL.   WHAT DO I DO ABOUT MY DIABETES MEDICATION?   DO NOT TAKE ANY ORAL DIABETIC MEDICATIONS DAY OF YOUR SURGERY  If your CBG is greater than 220 mg/dL, you may take  of your sliding scale  (correction) dose of insulin.                                You may not have any metal on your body including hair pins, jewelry, and body piercing             Do not wear  lotions, powders, perfumes/cologne, or deodorant              Men may shave face and neck.   Do not bring valuables to the hospital.  IS NOT             RESPONSIBLE   FOR VALUABLES.   Contacts, glasses, dentures or bridgework may not be worn into surgery.   Bring small overnight bag day of surgery.   DO NOT BRING YOUR HOME MEDICATIONS TO THE HOSPITAL. PHARMACY WILL DISPENSE MEDICATIONS LISTED ON YOUR MEDICATION LIST TO YOU DURING YOUR ADMISSION IN THE HOSPITAL!    Patients discharged on the day of surgery will not be allowed to drive home.  Someone NEEDS to stay with you for the first 24 hours after anesthesia.   Special Instructions: Bring a copy of your healthcare power of attorney and living will documents the day of surgery if you haven't scanned them before.              Please read over the following fact sheets you were given: IF YOU HAVE QUESTIONS ABOUT YOUR PRE-OP INSTRUCTIONS PLEASE CALL [phone].   If you received a COVID test during your pre-op  visit  it is requested that you wear a mask when out in public, stay away from anyone that may not be feeling well and notify your surgeon if you develop symptoms. If you test positive for Covid or have been in contact with anyone that has tested positive in the last 10 days please notify you surgeon.      Pre-operative 5 CHG Bath Instructions   You can play a key role  in reducing the risk of infection after surgery. Your skin needs to be as free of germs as possible. You can reduce the number of germs on your skin by washing with CHG (chlorhexidine  gluconate) soap before surgery. CHG is an antiseptic soap that kills germs and continues to kill germs even after washing.   DO NOT use if you have an allergy to chlorhexidine /CHG or antibacterial soaps. If your skin becomes reddened or irritated, stop using the CHG and notify one of our RNs at [phone].   Please shower with the CHG soap starting 4 days before surgery using the following schedule:     Please keep in mind the following:  DO NOT shave, including legs and underarms, starting the day of your first shower.   You may shave your face at any point before/day of surgery.  Place clean sheets on your bed the day you start using CHG soap. Use a clean washcloth (not used since being washed) for each shower. DO NOT sleep with pets once you start using the CHG.   CHG Shower Instructions:  If you choose to wash your hair and private area, wash first with your normal shampoo/soap.  After you use shampoo/soap, rinse your hair and body thoroughly to remove shampoo/soap residue.  Turn the water  OFF and apply about 3 tablespoons (45 ml) of CHG soap to a CLEAN washcloth.  Apply CHG soap ONLY FROM YOUR NECK DOWN TO YOUR TOES (washing for 3-5 minutes)  DO NOT use CHG soap on face, private areas, open wounds, or sores.  Pay special attention to the area where your surgery is being performed.  If you are having back surgery, having someone wash your back for you may be helpful. Wait 2 minutes after CHG soap is applied, then you may rinse off the CHG soap.  Pat dry with a clean towel  Put on clean clothes/pajamas   If you choose to wear lotion, please use ONLY the CHG-compatible lotions on the back of this paper.     Additional instructions for the day of surgery: DO NOT APPLY any lotions, deodorants, cologne, or  perfumes.   Put on clean/comfortable clothes.  Brush your teeth.  Ask your nurse before applying any prescription medications to the skin.   CHG Compatible Lotions   Aveeno Moisturizing lotion  Cetaphil Moisturizing Cream  Cetaphil Moisturizing Lotion  Clairol Herbal Essence Moisturizing Lotion, Dry Skin  Clairol Herbal Essence Moisturizing Lotion, Extra Dry Skin  Clairol Herbal Essence Moisturizing Lotion, Normal Skin  Curel Age Defying Therapeutic Moisturizing Lotion with Alpha Hydroxy  Curel Extreme Care Body Lotion  Curel Soothing Hands Moisturizing Hand Lotion  Curel Therapeutic Moisturizing Cream, Fragrance-Free  Curel Therapeutic Moisturizing Lotion, Fragrance-Free  Curel Therapeutic Moisturizing Lotion, Original Formula  Eucerin Daily Replenishing Lotion  Eucerin Dry Skin Therapy Plus Alpha Hydroxy Crme  Eucerin Dry Skin Therapy Plus Alpha Hydroxy Lotion  Eucerin Original Crme  Eucerin Original Lotion  Eucerin Plus Crme Eucerin Plus Lotion  Eucerin TriLipid Replenishing Lotion  Keri Anti-Bacterial Hand Lotion  Keri Deep  Conditioning Original Lotion Dry Skin Formula Softly Scented  Keri Deep Conditioning Original Lotion, Fragrance Free Sensitive Skin Formula  Keri Lotion Fast Absorbing Fragrance Free Sensitive Skin Formula  Keri Lotion Fast Absorbing Softly Scented Dry Skin Formula  Keri Original Lotion  Keri Skin Renewal Lotion Keri Silky Smooth Lotion  Keri Silky Smooth Sensitive Skin Lotion  Nivea Body Creamy Conditioning Oil  Nivea Body Extra Enriched Lotion  Nivea Body Original Lotion  Nivea Body Sheer Moisturizing Lotion Nivea Crme  Nivea Skin Firming Lotion  NutraDerm 30 Skin Lotion  NutraDerm Skin Lotion  NutraDerm Therapeutic Skin Cream  NutraDerm Therapeutic Skin Lotion  ProShield Protective Hand Cream  Provon moisturizing lotion   Incentive Spirometer  An incentive spirometer is a tool that can help keep your lungs clear and active. This tool  measures how well you are filling your lungs with each breath. Taking long deep breaths may help reverse or decrease the chance of developing breathing (pulmonary) problems (especially infection) following: A long period of time when you are unable to move or be active. BEFORE THE PROCEDURE  If the spirometer includes an indicator to show your best effort, your nurse or respiratory therapist will set it to a desired goal. If possible, sit up straight or lean slightly forward. Try not to slouch. Hold the incentive spirometer in an upright position. INSTRUCTIONS FOR USE  Sit on the edge of your bed if possible, or sit up as far as you can in bed or on a chair. Hold the incentive spirometer in an upright position. Breathe out normally. Place the mouthpiece in your mouth and seal your lips tightly around it. Breathe in slowly and as deeply as possible, raising the piston or the ball toward the top of the column. Hold your breath for 3-5 seconds or for as long as possible. Allow the piston or ball to fall to the bottom of the column. Remove the mouthpiece from your mouth and breathe out normally. Rest for a few seconds and repeat Steps 1 through 7 at least 10 times every 1-2 hours when you are awake. Take your time and take a few normal breaths between deep breaths. The spirometer may include an indicator to show your best effort. Use the indicator as a goal to work toward during each repetition. After each set of 10 deep breaths, practice coughing to be sure your lungs are clear. If you have an incision (the cut made at the time of surgery), support your incision when coughing by placing a pillow or rolled up towels firmly against it. Once you are able to get out of bed, walk around indoors and cough well. You may stop using the incentive spirometer when instructed by your caregiver.  RISKS AND COMPLICATIONS Take your time so you do not get dizzy or light-headed. If you are in pain, you may need to  take or ask for pain medication before doing incentive spirometry. It is harder to take a deep breath if you are having pain. AFTER USE Rest and breathe slowly and easily. It can be helpful to keep track of a log of your progress. Your caregiver can provide you with a simple table to help with this. If you are using the spirometer at home, follow these instructions: SEEK MEDICAL CARE IF:  You are having difficultly using the spirometer. You have trouble using the spirometer as often as instructed. Your pain medication is not giving enough relief while using the spirometer. You develop fever of 100.5 F (  38.1 C) or higher. SEEK IMMEDIATE MEDICAL CARE IF:  You cough up bloody sputum that had not been present before. You develop fever of 102 F (38.9 C) or greater. You develop worsening pain at or near the incision site. MAKE SURE YOU:  Understand these instructions. Will watch your condition. Will get help right away if you are not doing well or get worse. Document Released: 10/08/2006 Document Revised: 08/20/2011 Document Reviewed: 12/09/2006 [facility] Patient Information 2014 [place], [place].   ________________________________________________________________________

## 2023-12-30 ENCOUNTER — Encounter (HOSPITAL_COMMUNITY)
Admission: RE | Admit: 2023-12-30 | Discharge: 2023-12-30 | Disposition: A | Discharge status: Home / Self Care | Source: Ambulatory Visit | Attending: Orthopedic Surgery

## 2023-12-30 ENCOUNTER — Encounter (HOSPITAL_COMMUNITY): Payer: Self-pay

## 2023-12-30 ENCOUNTER — Other Ambulatory Visit: Payer: Self-pay

## 2023-12-30 VITALS — BP 153/101 | HR 101 | Temp 98.4°F | Ht 68.0 in | Wt 143.0 lb

## 2023-12-30 DIAGNOSIS — I509 Heart failure, unspecified: Secondary | ICD-10-CM | POA: Insufficient documentation

## 2023-12-30 DIAGNOSIS — E1169 Type 2 diabetes mellitus with other specified complication: Secondary | ICD-10-CM | POA: Diagnosis not present

## 2023-12-30 DIAGNOSIS — Z01818 Encounter for other preprocedural examination: Secondary | ICD-10-CM

## 2023-12-30 DIAGNOSIS — Z01812 Encounter for preprocedural laboratory examination: Secondary | ICD-10-CM | POA: Diagnosis not present

## 2023-12-30 HISTORY — DX: Type 2 diabetes mellitus without complications: E11.9

## 2023-12-30 LAB — CBC
HCT: 35 % — ABNORMAL LOW (ref 39.0–52.0)
Hemoglobin: 10.9 g/dL — ABNORMAL LOW (ref 13.0–17.0)
MCH: 31.8 pg (ref 26.0–34.0)
MCHC: 31.1 g/dL (ref 30.0–36.0)
MCV: 102 fL — ABNORMAL HIGH (ref 80.0–100.0)
Platelets: 331 K/uL (ref 150–400)
RBC: 3.43 MIL/uL — ABNORMAL LOW (ref 4.22–5.81)
RDW: 15.2 % (ref 11.5–15.5)
WBC: 6.6 K/uL (ref 4.0–10.5)
nRBC: 0 % (ref 0.0–0.2)

## 2023-12-30 LAB — COMPREHENSIVE METABOLIC PANEL WITH GFR
ALT: 40 U/L (ref 0–44)
AST: 39 U/L (ref 15–41)
Albumin: 3.2 g/dL — ABNORMAL LOW (ref 3.5–5.0)
Alkaline Phosphatase: 292 U/L — ABNORMAL HIGH (ref 38–126)
Anion gap: 10 (ref 5–15)
BUN: 32 mg/dL — ABNORMAL HIGH (ref 6–20)
CO2: 19 mmol/L — ABNORMAL LOW (ref 22–32)
Calcium: 8.9 mg/dL (ref 8.9–10.3)
Chloride: 110 mmol/L (ref 98–111)
Creatinine, Ser: 2.66 mg/dL — ABNORMAL HIGH (ref 0.61–1.24)
GFR, Estimated: 28 mL/min — ABNORMAL LOW (ref >60)
Glucose, Bld: 209 mg/dL — ABNORMAL HIGH (ref 70–99)
Potassium: 4 mmol/L (ref 3.5–5.1)
Sodium: 139 mmol/L (ref 135–145)
Total Bilirubin: 1 mg/dL (ref 0.0–1.2)
Total Protein: 6.7 g/dL (ref 6.5–8.1)

## 2023-12-30 LAB — HEMOGLOBIN A1C
Hgb A1c MFr Bld: 6.7 % — ABNORMAL HIGH (ref 4.8–5.6)
Mean Plasma Glucose: 145.59 mg/dL

## 2023-12-30 LAB — GLUCOSE, CAPILLARY: Glucose-Capillary: 213 mg/dL — ABNORMAL HIGH (ref 70–99)

## 2023-12-30 LAB — SURGICAL PCR SCREEN
MRSA, PCR: NEGATIVE
Staphylococcus aureus: NEGATIVE

## 2023-12-30 NOTE — Progress Notes (Addendum)
 For Anesthesia: PCP - Duanne Butler DASEN, MD  Cardiologist - [name], Toribio SAUNDERS, MD  Zoraida Barns, MD LOV: 12/19/22 Bowel Prep reminder:  Chest x-ray - 06/14/23. CT chest: 06/14/23 EKG - 06/17/23 Stress Test - 11/08/22 ECHO - 12/20/20 Cardiac Cath - 01/15/23 Pacemaker/ICD device last checked: Pacemaker orders received: Device Rep notified:  Spinal Cord Stimulator:N/A  Sleep Study - N/A CPAP -   Fasting Blood Sugar - N/A Checks Blood Sugar ___0__ times a day Date and result of last Hgb A1c-  Last dose of GLP1 agonist- N/A GLP1 instructions:   Last dose of SGLT-2 inhibitors- N/A SGLT-2 instructions:   Blood Thinner Instructions: Aspirin  Instructions: No instructions yet,appointment today with surgeon. 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 BP elevated during PST visit,as per pt. It has been high after transplant.Pt. did not take his BP meds. Yet.  Patient denies shortness of breath, fever, cough and chest pain at PAT appointment   Patient verbalized understanding of instructions that were reviewed over the telephone.

## 2023-12-30 NOTE — Progress Notes (Signed)
 Lab. Results: Creatinine: 2.66

## 2023-12-31 ENCOUNTER — Ambulatory Visit: Payer: Self-pay | Admitting: Student

## 2024-01-01 DIAGNOSIS — Z941 Heart transplant status: Secondary | ICD-10-CM | POA: Diagnosis not present

## 2024-01-01 DIAGNOSIS — Z48298 Encounter for aftercare following other organ transplant: Secondary | ICD-10-CM | POA: Diagnosis not present

## 2024-01-01 NOTE — Progress Notes (Signed)
 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 former smoker with h/o GERD, CAD, CHF, s/p heart transplant 02/05/2023, cirrhosis (cardiogenic, ETOH), Wilms tumor s/p left nephrectomy as a child, CKD Stage III, right hip OA scheduled for above procedure 01/08/2024 with Dr. Rogue Fidel.   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 Last seen 12/19/2023. CT 09/12/2023 with near complete resolution of right middle lobe consolidation. At this visit IV ganciclovir stopped, transitioned to PO valganciclovir. PICC line removed 7/14. 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   Clearance received from ID which states, From an infectious disease perspective, Mr. Isabell is cleared for R THA. His ID issues are chronic and well-managed at this stage.  Pt last seen by cardiac transplant service 12/19/2023. Pt asymptomatic at this visit. Per notes in preparation for hip surgery biopsy to be done, if negative will wean prednisone  to 2.5 mg and restart MMF 250 mg BID. Per notes biopsy reassuring MMF resumed and prednisone  decreased to 2.5 mg.   Clearance from cardiology received which states pt is low to moderate risk.  Would give prednisone  40mg  on day of surgery and 20 mg next day and then back to usual dose  Clearance received from PCP which states pt is cleared medically.   Pt admitted in June for AKI, creatinine at highest 3.12. Per notes baseline creatinine 1.8.  Creatinine has been elevated since discharge, see below.  Discussed with PCP who advised pt to hold metformin and scheduled pt for repeat labs in their clinic.  Pt declined  visit, states he has had labs recently.  Discussed with Dr. Maryclare.  Pt can stay on schedule, evaluate DOS.  Lab recheck DOS.   01/01/2024 Creatinine 2.46 12/30/2023 Creatinine 2.66 12/19/2023 Creatinine 2.4 12/12/2023 Creatinine 2.21  Echo 12/19/2023 (Care Everywhere) NORMAL LEFT VENTRICULAR SYSTOLIC FUNCTION WITH NO LVH  ESTIMATED EF: 55%  DIASTOLIC FUNCTION NOT ASSESSED  NORMAL RIGHT VENTRICULAR SYSTOLIC FUNCTION  VALVULAR REGURGITATION: No AR, TRIVIAL MR, TRIVIAL PR, MILD TR  ESTIMATED RVSP: 29 mmHg  NO VALVULAR STENOSIS  NORMAL TRAVERSAL OF TRICUSPID VALVE FOR RVB   VS: BP (!) 153/101 Comment: pt. did not take meds. today  Pulse (!) 101   Temp 36.9 C (Oral)   Ht 5' 8 (1.727 m)   Wt 64.9 kg   SpO2 99%   BMI 21.74 kg/m   PROVIDERS: Duanne Butler DASEN, MD is PCP    LABS: recheck DOS (all labs ordered are listed, but only abnormal results are displayed)  Labs Reviewed  HEMOGLOBIN A1C - Abnormal; Notable for the following components:      Result Value   Hgb A1c MFr Bld 6.7 (*)    All other components within normal limits  COMPREHENSIVE METABOLIC PANEL WITH GFR - Abnormal; Notable for the following components:   CO2 19 (*)    Glucose, Bld 209 (*)    BUN 32 (*)    Creatinine, Ser 2.66 (*)    Albumin  3.2 (*)    Alkaline Phosphatase 292 (*)    GFR, Estimated 28 (*)    All other components within normal limits  CBC - Abnormal; Notable  for the following components:   RBC 3.43 (*)    Hemoglobin 10.9 (*)    HCT 35.0 (*)    MCV 102.0 (*)    All other components within normal limits  GLUCOSE, CAPILLARY - Abnormal; Notable for the following components:   Glucose-Capillary 213 (*)    All other components within normal limits  SURGICAL PCR SCREEN     IMAGES:   EKG:   CV: Echo 09/04/2021 1. Left ventricular ejection fraction, by estimation, is <20%. The left  ventricle has severely decreased function. The left ventricle demonstrates  global hypokinesis. The  left ventricular internal cavity size was  moderately dilated. Left ventricular  diastolic parameters are consistent with Grade III diastolic dysfunction  (restrictive).   2. Right ventricular systolic function is normal. The right ventricular  size is mildly enlarged. There is moderately elevated pulmonary artery  systolic pressure. The estimated right ventricular systolic pressure is  56.7 mmHg.   3. Left atrial size was moderately dilated.   4. Right atrial size was moderately dilated.   5. The mitral valve is normal in structure. No evidence of mitral valve  regurgitation. No evidence of mitral stenosis.   6. Tricuspid valve regurgitation is moderate.   7. The aortic valve is normal in structure. Aortic valve regurgitation is  not visualized. No aortic stenosis is present.   8. The inferior vena cava is dilated in size with <50% respiratory  variability, suggesting right atrial pressure of 15 mmHg.  Past Medical History:  Diagnosis Date   AICD (automatic cardioverter/defibrillator) present    Arthritis    Cardiomyopathy, nonischemic (HCC)    takes Digoxin  daily and Carvedilol  daily   CHF (congestive heart failure) (HCC)    takes Lasix  daily as well Aldactone    Chronic back pain    Chronic systolic heart failure (HCC)    Cirrhosis (HCC)    Depression    takes Prozac  daily   Diabetes mellitus without complication (HCC)    GERD (gastroesophageal reflux disease)    takes Omeprazole  daily   Hx of Alcohol consumption heavy    rare beer currently   hx of Tobacco abuse    quit smoking 2014   Hyperlipidemia    was on Simvastatin  but has been off a year   Insomnia    takes Ambien  as needed(just got script yesterday)   Orthostatic hypotension    Scoliosis    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 42 (XRT and chemo)    Past Surgical History:  Procedure Laterality Date   BIOPSY  10/31/2022   Procedure: BIOPSY;  Surgeon: Leigh Elspeth SQUIBB, MD;  Location: [facility] ENDOSCOPY;  Service:  Gastroenterology;;   ORIN MEDIATE RELEASE Right 01/09/2013   Procedure: CARPAL TUNNEL RELEASE;  Surgeon: Rockey LITTIE Peru, MD;  Location: [facility] NEURO ORS;  Service: Neurosurgery;  Laterality: Right;  RIGHT carpal tunnel release   CARPAL TUNNEL RELEASE Left 01/30/2013   Procedure: LEFT CARPAL TUNNEL RELEASE;  Surgeon: Rockey LITTIE Peru, MD;  Location: [facility] NEUR[facility] ORS;  Service: Neurosurgery;  Laterality: Left;  LEFT Carpal Tunnel release   CHEST TUBE INSERTION Right 09/09/2013   Procedure: INSERTION PLEURAL DRAINAGE CATHETER;  Surgeon: Maude Fleeta Ochoa, MD;  Location: [facility] OR;  Service: Thoracic;  Laterality: Right;   COLONOSCOPY WITH PROPOFOL  N/A 10/31/2022   Procedure: COLONOSCOPY WITH PROPOFOL ;  Surgeon: Leigh Elspeth SQUIBB, MD;  Location: [facility] ENDOSCOPY;  Service: Gastroenterology;  Laterality: N/A;   HYDROCELE EXCISION Right 04/11/2017   Procedure: RIGHT HYDROCELECTOMY  ADULT;  Surgeon: Watt Rush, MD;  Location: [facility];  Service: Urology;  Laterality: Right;   hydrocelectomy  2008   ICD  05/25/2011   [facility] [employer] SG lead/[employer] single chamber device   IMPLANTABLE CARDIOVERTER DEFIBRILLATOR IMPLANT N/A 05/25/2011   Procedure: IMPLANTABLE CARDIOVERTER DEFIBRILLATOR IMPLANT;  Surgeon: Danelle LELON Birmingham, MD;  Location: [facility] CATH LAB;  Service: Cardiovascular;  Laterality: N/A;   IR FLUORO GUIDE CV LINE RIGHT  01/16/2023   IR US  GUIDE VASC ACCESS RIGHT  01/16/2023   NEPHRECTOMY  36 yrs ago   Wilms Tumor   POLYPECTOMY  10/31/2022   Procedure: POLYPECTOMY;  Surgeon: Leigh Elspeth SQUIBB, MD;  Location: [facility] ENDOSCOPY;  Service: Gastroenterology;;   RIGHT HEART CATH N/A 01/29/2019   Procedure: RIGHT HEART CATH;  Surgeon: Cherrie Toribio SAUNDERS, MD;  Location: [facility] INVASIVE CV LAB;  Service: Cardiovascular;  Laterality: N/A;   RIGHT HEART CATH N/A 09/08/2019   Procedure: RIGHT HEART CATH;  Surgeon: Cherrie Toribio SAUNDERS, MD;  Location: [facility] INVAS[facility] CV LAB;  Service: Cardiovascular;  Laterality: N/A;   RIGHT HEART  CATH N/A 01/15/2023   Procedure: RIGHT HEART CATH;  Surgeon: Cherrie Toribio SAUNDERS, MD;  Location: [facility]VAS[facility] CV LAB;  Service: Cardiovascular;  Laterality: N/A;   RIGHT/LEFT HEART CATH AND CORONARY ANGIOGRAPHY N/A 02/12/2017   Procedure: RIGHT/LEFT HEART CATH AND CORONARY ANGIOGRAPHY;  Surgeon: Cherrie Toribio SAUNDERS, MD;  Location: [facility] INVAS[facility] CV LAB;  Service: Cardiovascular;  Laterality: N/A;   ULNAR NERVE TRANSPOSITION Right 01/09/2013   Procedure: ULNAR NERVE DECOMPRESSION/TRANSPOSITION;  Surgeon: Rockey LITTIE Peru, MD;  Location: [facility];  Service: Neurosurgery;  Laterality: Right;  RIGHT ulnar nerve decompression    MEDICATIONS:  aspirin  81 MG tablet   atovaquone (MEPRON) 750 MG/5ML suspension   azithromycin  (ZITHROMAX ) 500 MG tablet   Blood Glucose Monitoring Suppl DEVI   cetirizine  (ZYRTEC ) 10 MG tablet   insulin regular (NOVOLIN R) 100 units/mL injection   labetalol  (NORMODYNE ) 200 MG tablet   MAGNESIUM -OXIDE 400 (240 Mg) MG tablet   metFORMIN (GLUCOPHAGE-XR) 500 MG 24 hr tablet   mycophenolate (CELLCEPT) 250 MG capsule   oxyCODONE  (OXY IR/ROXICODONE ) 5 MG immediate release tablet   pantoprazole  (PROTONIX ) 40 MG tablet   predniSONE  (DELTASONE ) 2.5 MG tablet   tacrolimus  (PROGRAF ) 1 MG capsule   valGANciclovir (VALCYTE) 450 MG tablet   No current facility-administered medications for this encounter.    Harlene Hoots Ward, PA-C [facility]-Surgical Testing [phone]

## 2024-01-02 ENCOUNTER — Other Ambulatory Visit: Payer: Self-pay | Admitting: Family Medicine

## 2024-01-02 LAB — LAB REPORT - SCANNED: EGFR: 31

## 2024-01-02 NOTE — Telephone Encounter (Unsigned)
 Copied from CRM [id number]. Topic: Clinical - Medication Refill >> Jan 02, 2024  9:28 AM Marissa P wrote: Medication: oxyCODONE  (OXY IR/ROXICODONE ) 5 MG immediate release tablet  Has the patient contacted their pharmacy? No (Agent: If no, request that the patient contact the pharmacy for the refill. If patient does not wish to contact the pharmacy document the reason why and proceed with request.) (Agent: If yes, when and what did the pharmacy advise?)  This is the patient's preferred pharmacy:  [facility] [address] [place] - [address] [place] [address] Phone: [phone] Fax: [phone]  Is this the correct pharmacy for this prescription? Yes If no, delete pharmacy and type the correct one.   Has the prescription been filled recently? Yes  Is the patient out of the medication? Yes  Has the patient been seen for an appointment in the last year OR does the patient have an upcoming appointment? Yes  Can we respond through MyChart? No  Agent: Please be advised that Rx refills may take up to 3 business days. We ask that you follow-up with your pharmacy.

## 2024-01-03 MED ORDER — OXYCODONE HCL 5 MG PO TABS: 5.0000 mg | ORAL_TABLET | Freq: Three times a day (TID) | ORAL | 0 refills | Status: DC | PRN

## 2024-01-03 NOTE — Telephone Encounter (Signed)
 Requested medications are due for refill today.  yes  Requested medications are on the active medications list.  yes  Last refill. 12/05/2023 #60 0 rf  Future visit scheduled.   yes  Notes to clinic.  Refill not delegated.    Requested Prescriptions  Pending Prescriptions Disp Refills   oxyCODONE  (OXY IR/ROXICODONE ) 5 MG immediate release tablet 60 tablet 0    Sig: Take 1 tablet (5 mg total) by mouth 3 (three) times daily as needed for severe pain (pain score 7-10).     Not Delegated - Analgesics:  Opioid Agonists Failed - 01/03/2024  1:55 PM      Failed - This refill cannot be delegated      Failed - Urine Drug Screen completed in last 360 days      Passed - Valid encounter within last 3 months    Recent Outpatient Visits           3 months ago Type 2 diabetes mellitus with other specified complication, without long-term current use of insulin (HCC)   [place] [facility] Duanne Butler DASEN, MD   9 months ago Type 2 diabetes mellitus with other specified complication, without long-term current use of insulin [facility])   [facility] Duanne Butler DASEN, MD   1 year ago Type 2 diabetes mellitus with other specified complication, without long-term current use of insulin [facility])   [place] [facility] Pickard, Butler DASEN, MD   1 year ago Tooth impaction    [facility] Pickard, Butler DASEN, MD

## 2024-01-06 ENCOUNTER — Telehealth: Payer: Self-pay

## 2024-01-06 NOTE — Telephone Encounter (Signed)
 Message from [facility], PA-C  Hi Dr. Duanne. This patient is scheduled Wednesday for hip surgery. I am reviewing his chart per anesthesia. He was recently admitted for AKI, prior to this baseline creatinine 1.8. His creatinine since admission has been 2.2-2.66. I don't think he has followed up with you since this admission, I don't see that he is followed by nephrologist. Any further evaluation or interventions recommended before surgery on Wednesday?   Per Dr. Duanne: we need to get this patient in to recheck his renal fcn. I would suggest he hold his metformin. he needs to be seen tomorrow or Thursday.   7/28 @ 11:30 am-Spoke with patient and he states he had labs done at [facility] in [place] for [employer]. Pt declines to schedule until those labs are reviewed. Mjp,lpn

## 2024-01-07 ENCOUNTER — Ambulatory Visit: Payer: Self-pay | Admitting: Student

## 2024-01-07 ENCOUNTER — Ambulatory Visit: Admitting: Family Medicine

## 2024-01-07 NOTE — Anesthesia Preprocedure Evaluation (Addendum)
 Anesthesia Evaluation    Reviewed: Allergy & Precautions, Patient's Chart, lab work & pertinent test results, reviewed documented beta blocker date and time   History of Anesthesia Complications Negative for: history of anesthetic complications  Airway        Dental   Pulmonary former smoker          Cardiovascular hypertension, Pt. on home beta blockers and Pt. 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 at [facility]  TTE 11/22/2023:  NORMAL LEFT VENTRICULAR SYSTOLIC FUNCTION WITH MILD LVH  ESTIMATED EF: >55%  DIASTOLIC FUNCTION CAN'T BE DETERMINED  NORMAL RIGHT VENTRICULAR SYSTOLIC FUNCTION  VALVULAR REGURGITATION: No AR, TRIVIAL MR, TRIVIAL PR, MODERATE TR  ESTIMATED RVSP: 47 mmHg  NO VALVULAR STENOSIS     Neuro/Psych    Depression    negative neurological ROS     GI/Hepatic ,GERD  Medicated,,(+) Cirrhosis         Endo/Other  diabetes, Type 2, Oral Hypoglycemic Agents, Insulin Dependent    Renal/GU Renal InsufficiencyRenal disease (Cr 2.66, baseline CrCl appears to be 30-40)Hx of Wilms tumor S/P nephrectomy at age 15  negative genitourinary   Musculoskeletal  (+) Arthritis ,    Abdominal   Peds  Hematology  (+) Blood dyscrasia (Hgb 10.9), anemia   Anesthesia Other Findings Day of surgery medications reviewed with patient.  Reproductive/Obstetrics negative OB ROS                              Anesthesia Physical Anesthesia Plan  ASA:   Anesthesia Plan:    Post-op Pain Management:    Induction:   PONV Risk Score and Plan:   Airway Management Planned:   Additional Equipment:   Intra-op Plan:   Post-operative Plan:   Informed Consent:   Plan Discussed with:   Anesthesia Plan Comments: (See PAT note 12/30/23)         Anesthesia Quick Evaluation

## 2024-01-07 NOTE — H&P (Signed)
 TOTAL HIP ADMISSION H&P  Patient is admitted for right total hip arthroplasty.  Subjective:  Chief Complaint: right hip pain  HPI: [name], 53 y.o. male, has a history of pain and functional disability in the right hip(s) due to avascular necrosis with femoral head collapse and patient has failed non-surgical conservative treatments for greater than 12 weeks to include NSAID's and/or analgesics, flexibility and strengthening excercises, use of assistive devices, and activity modification.  Onset of symptoms was gradual starting 1 years ago with rapidlly worsening course since that time.The patient noted no past surgery on the right hip(s).  Patient currently rates pain in the right hip at 10 out of 10 with activity. Patient has night pain, worsening of pain with activity and weight bearing, trendelenberg gait, pain that interfers with activities of daily living, pain with passive range of motion, and joint swelling. Patient has evidence of subchondral cysts, subchondral sclerosis, periarticular osteophytes, joint space narrowing, and avascular necrosis with femoral head collapse by imaging studies. This condition presents safety issues increasing the risk of falls.  There is no current active infection.  Patient Active Problem List   Diagnosis Date Noted   Acute on chronic systolic (congestive) heart failure (HCC) 01/15/2023   Positive colorectal cancer screening using Cologuard test 10/31/2022   Benign neoplasm of colon 10/31/2022   Mucosal abnormality of colon 10/31/2022   Adrenal mass 1 cm to 4 cm in diameter (HCC) 08/21/2022   Heart transplant candidate 05/23/2022   Pulmonary nodule 05/22/2022   Acute on chronic systolic congestive heart failure (HCC)    Acute on chronic combined systolic and diastolic CHF (congestive heart failure) (HCC) 09/03/2021   GERD (gastroesophageal reflux disease) 09/03/2021   Cough 09/03/2021   Hyponatremia 09/03/2021   Hypokalemia 09/03/2021   DM  (diabetes mellitus) (HCC) 08/12/2019   Tobacco abuse    Alcohol abuse 10/19/2015   Cirrhosis of liver (HCC) 09/21/2015   Erectile dysfunction 03/18/2015   Chronic pain syndrome 09/15/2014   Insomnia 09/07/2013   Chronic CHF (congestive heart failure) (HCC) 08/25/2013   Paresthesia of hand 11/12/2012   Hyperlipidemia 08/12/2012   Scoliosis 01/01/2012   H/O compression fracture of spine 01/01/2012   Back pain 10/22/2011   Neck pain 10/22/2011   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/30/2011   Depression 07/25/2011   Laboratory test 06/20/2011   Wilms' tumor [facility])    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   Past Medical History:  Diagnosis Date   AICD (automatic cardioverter/defibrillator) present    Arthritis    Cardiomyopathy, nonischemic (HCC)    takes Digoxin  daily and Carvedilol  daily   CHF (congestive heart failure) (HCC)    takes Lasix  daily as well Aldactone    Chronic back pain    Chronic systolic heart failure (HCC)    Cirrhosis (HCC)    Depression    takes Prozac  daily   Diabetes mellitus without complication (HCC)    GERD (gastroesophageal reflux disease)    takes Omeprazole  daily   Hx of Alcohol consumption heavy    rare beer currently   hx of Tobacco abuse    quit smoking 2014   Hyperlipidemia    was on Simvastatin  but has been off a year   Insomnia    takes Ambien  as needed(just got script yesterday)   Orthostatic hypotension    Scoliosis    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 69 (XRT and chemo)    Past Surgical History:  Procedure Laterality Date   BIOPSY  10/31/2022   Procedure: BIOPSY;  Surgeon: Leigh Elspeth SQUIBB, MD;  Location[facility] ENDOSCOPY;  Service: Gastroenterology;;   ORIN MEDIATE RELEASE Right 01/09/2013   Procedure: CARPAL TUNNEL RELEASE;  Surgeon: Rockey LITTIE Peru, MD;  Location: [facility] NEURO ORS;  Service: Neurosurgery;  Laterality: Right;  RIGHT carpal tunnel release   CARPAL TUNNEL RELEASE Left 01/30/2013    Procedure: LEFT CARPAL TUNNEL RELEASE;  Surgeon: Rockey LITTIE Peru, MD;  Location: [facility] NEURO ORS;  Service: Neurosurgery;  Laterality: Left;  LEFT Carpal Tunnel release   CHEST TUBE INSERTION Right 09/09/2013   Procedure: INSERTION PLEURAL DRAINAGE CATHETER;  Surgeon: Maude Fleeta Ochoa, MD;  [facility] OR;  Service: Thoracic;  Laterality: Right;   COLONOSCOPY WITH PROPOFOL  N/A 10/31/2022   Procedure: COLONOSCOPY WITH PROPOFOL ;  Surgeon: Leigh Elspeth SQUIBB, MD;  Location: [facility] ENDOSCOPY;  Service: Gastroenterology;  Laterality: N/A;   HYDROCELE EXCISION Right 04/11/2017   Procedure: RIGHT HYDROCELECTOMY ADULT;  Surgeon: Watt Rush, MD;  Location: [facility] ORS;  Service: Urology;  Laterality: Right;   hydrocelectomy  2008   ICD  05/25/2011   [facility] [employer] SG lead/[employer]ner[employer] single chamber device   IMPLANTABLE CARDIOVERTER DEFIBRILLATOR IMPLANT N/A 05/25/2011   Procedure: IMPLANTABLE CARDIOVERTER DEFIBRILLATOR IMPLANT;  Surgeon: Danelle LELON Birmingham, MD;  Location: [facility] CATH LAB;  Service: Cardiovascular;  Laterality: N/A;   IR FLUORO GUIDE CV LINE RIGHT  01/16/2023   IR US  GUIDE VASC ACCESS RIGHT  01/16/2023   NEPHRECTOMY  36 yrs ago   Wilms Tumor   POLYPECTOMY  10/31/2022   Procedure: POLYPECTOMY;  Surgeon: Leigh Elspeth SQUIBB, MD;  Location: [facility] ENDOSCOPY;  Service: Gastroenterology;;   RIGHT HEART CATH N/A 01/29/2019   Procedure: RIGHT HEART CATH;  Surgeon: Cherrie Toribio SAUNDERS, MD;  Location: [facility] INVASIVE CV LAB;  Service: Cardiovascular;  Laterality: N/A;   RIGHT HEART CATH N/A 09/08/2019   Procedure: RIGHT HEART CATH;  Surgeon: Cherrie Toribio SAUNDERS, MD;  Location: [facility] INVASIVE CV LAB;  Service: Cardiovascular;  Laterality: N/A;   RIGHT HEART CATH N/A 01/15/2023   Procedure: RIGHT HEART CATH;  Surgeon: Cherrie Toribio SAUNDERS, MD;  Location: [facility] INVASIVE CV LAB;  Service: Cardiovascular;  Laterality: N/A;   RIGHT/LEFT HEART CATH AND CORONARY ANGIOGRAPHY N/A 02/12/2017   Procedure: RIGHT/LEFT HEART CATH AND  CORONARY ANGIOGRAPHY;  Surgeon: Cherrie Toribio SAUNDERS, MD;  Location: [facility] INVASIVE CV LAB;  Service: Cardiovascular;  Laterality: N/A;   ULNAR NERVE TRANSPOSITION Right 01/09/2013   Procedure: ULNAR NERVE DECOMPRESSION/TRANSPOSITION;  Surgeon: Rockey LITTIE Peru, MD;  Location: [facility] NEURO ORS;  Service: Neurosurgery;  Laterality: Right;  RIGHT ulnar nerve decompression    Current Outpatient Medications  Medication Sig Dispense Refill Last Dose/Taking   aspirin  81 MG tablet Take 81 mg by mouth in the morning.      atovaquone (MEPRON) 750 MG/5ML suspension Take 750 mg by mouth daily.      azithromycin  (ZITHROMAX ) 500 MG tablet Take 500 mg by mouth in the morning.      Blood Glucose Monitoring Suppl DEVI 1 each by Does not apply route in the morning, at noon, and at bedtime. May substitute to any manufacturer covered by patient's insurance. 1 each 0    cetirizine  (ZYRTEC ) 10 MG tablet Take 1 tablet (10 mg total) by mouth daily. (Patient not taking: Reported on 12/25/2023) 90 tablet 3    insulin regular (NOVOLIN R) 100 units/mL injection Inject into the skin as needed for high blood sugar. Sliding scale: 70-200: NO extra regular insulin, 201-250:  Take 2 extra units, 251-300: Take 4 extra units, 301-350: take 6 units, 351-400:take 8 units, greater than 400: take 10 extra units and call your doctor      labetalol  (NORMODYNE ) 200 MG tablet Take 200 mg by mouth 2 (two) times daily.      MAGNESIUM -OXIDE 400 (240 Mg) MG tablet Take 400 mg by mouth in the morning.      metFORMIN (GLUCOPHAGE-XR) 500 MG 24 hr tablet Take 500 mg by mouth in the morning.      mycophenolate (CELLCEPT) 250 MG capsule Take 250 mg by mouth every 12 (twelve) hours.      oxyCODONE  (OXY IR/ROXICODONE ) 5 MG immediate release tablet Take 1 tablet (5 mg total) by mouth 3 (three) times daily as needed for severe pain (pain score 7-10). 60 tablet 0    pantoprazole  (PROTONIX ) 40 MG tablet Take 40 mg by mouth daily before breakfast.      predniSONE   (DELTASONE ) 2.5 MG tablet Take 2.5 mg by mouth daily with breakfast.      tacrolimus  (PROGRAF ) 1 MG capsule Take 3 mg by mouth 2 (two) times daily.      valGANciclovir (VALCYTE) 450 MG tablet Take 450 mg by mouth in the morning.      No current facility-administered medications for this visit.   Allergies  Allergen Reactions   Ibuprofen Hives   Nsaids     ELEVATED LFT'S    Social History   Tobacco Use   Smoking status: Former    Current packs/day: 0.00    Average packs/day: 0.3 packs/day for 20.0 years (5.0 ttl pk-yrs)    Types: Cigarettes    Start date: 09/07/1992    Quit date: 09/07/2012    Years since quitting: 11.3   Smokeless tobacco: Current    Types: Snuff  Substance Use Topics   Alcohol use: Not Currently    Comment: histoorically a heavy drinker (beer only), none for a couple months     Family History  Problem Relation Age of Onset   Diabetes Mother    Colon cancer Neg Hx    Stomach cancer Neg Hx    Esophageal cancer Neg Hx      Review of Systems  Musculoskeletal:  Positive for arthralgias and gait problem.  All other systems reviewed and are negative.   Objective:  Physical Exam Constitutional:      Appearance: Normal appearance.  HENT:     Head: Normocephalic and atraumatic.     Nose: Nose normal.     Mouth/Throat:     Mouth: Mucous membranes are moist.     Pharynx: Oropharynx is clear.  Eyes:     Conjunctiva/sclera: Conjunctivae normal.  Cardiovascular:     Rate and Rhythm: Normal rate and regular rhythm.     Pulses: Normal pulses.     Heart sounds: Normal heart sounds.  Pulmonary:     Effort: Pulmonary effort is normal.     Breath sounds: Normal breath sounds.  Abdominal:     General: Abdomen is flat.     Palpations: Abdomen is soft.  Genitourinary:    Comments: deferred Musculoskeletal:     Cervical back: Normal range of motion and neck supple.     Comments: Examination of the right hip reveals no skin wounds or lesions. Mild  trochanteric tenderness to palpation. Severely restricted range of motion of the right hip. Pain with flexion and rotation. Pain in the position of impingement. Positive Stinchfield.  Neurovascular intact distally.  Ambulates with  a markedly antalgic gait.  Skin:    General: Skin is warm and dry.     Capillary Refill: Capillary refill takes less than 2 seconds.  Neurological:     General: No focal deficit present.     Mental Status: He is alert and oriented to person, place, and time.  Psychiatric:        Mood and Affect: Mood normal.        Behavior: Behavior normal.        Thought Content: Thought content normal.        Judgment: Judgment normal.     Vital signs in last 24 hours: @VSRANGES @  Labs:   Estimated body mass index is 21.74 kg/m as calculated from the following:   Height as of 12/30/23: 5' 8 (1.727 m).   Weight as of 12/30/23: 64.9 kg.   Imaging Review Plain radiographs demonstrate severe degenerative joint disease of the right hip(s). The bone quality appears to be adequate for age and reported activity level.      Assessment/Plan:  Avascular necrosis with femoral head collapse, right hip(s)  The patient history, physical examination, clinical judgement of the provider and imaging studies are consistent with end stage degenerative joint disease of the right hip(s) and total hip arthroplasty is deemed medically necessary. The treatment options including medical management, injection therapy, arthroscopy and arthroplasty were discussed at length. The risks and benefits of total hip arthroplasty were presented and reviewed. The risks due to aseptic loosening, infection, stiffness, dislocation/subluxation,  thromboembolic complications and other imponderables were discussed.  The patient acknowledged the explanation, agreed to proceed with the plan and consent was signed. Patient is being admitted for inpatient treatment for surgery, pain control, PT, OT,  prophylactic antibiotics, VTE prophylaxis, progressive ambulation and ADL's and discharge planning.The patient is planning to be discharged home with HEP after an overnight stay.   Therapy Plans: HEP.  Disposition: Home with son. Planned DVT Prophylaxis: aspirin  81mg  BID DME needed: Has rolling walker and cane.  PCP: Cleared, history of heart transplant requesting clearance from transplant team, recent CMV admission, recommend ID clearance.  Cardiology: Cleared. Recommend 40mg  stress dose steroids POD, then 20mg  dose POD#1, then back to baseline prednisone . Would like patient on 2.5mg  prednisone  prior to surgery.  ID: Cleared. On azithromycin , linezolid.  TXA: IV Allergies:  - ibuprofen - hives. - Tolmetin - elevated LFTs.  Anesthesia Concerns: None.  BMI: 21.8.  Last HgbA1c: 6.7 Other: - Heart transplant history, tacrolimus , prednisone  on 2.5mg  daily, mycophenolate.  - CKD history, Wilms tumor, nephrectomy as child.  - Lung.  - T2DM, metformin. Insulin with steroids.  - Oxycodone  baseline from PCP, 5mg  1-2 daily.  - Oxycodone , zofran , methocarbamol. No NSAIDs. - 12/30/23: Hgb 10.9, Cr. 2.66, K+ 4.0.   Patient's anticipated LOS is less than 2 midnights, meeting these requirements: - Younger than 94 - Lives within 1 hour of care - Has a competent adult at home to recover with post-op recover - NO history of  - Chronic pain requiring opiods  - Diabetes  - Coronary Artery Disease  - Heart failure  - Heart attack  - Stroke  - DVT/VTE  - Cardiac arrhythmia  - Respiratory Failure/COPD  - Renal failure  - Anemia  - Advanced Liver disease

## 2024-01-08 ENCOUNTER — Encounter (HOSPITAL_COMMUNITY): Payer: Self-pay | Admitting: Physician Assistant

## 2024-01-08 ENCOUNTER — Encounter (HOSPITAL_COMMUNITY): Payer: Self-pay | Admitting: Medical

## 2024-01-08 ENCOUNTER — Encounter (HOSPITAL_COMMUNITY): Admission: RE | Payer: Self-pay | Source: Home / Self Care

## 2024-01-08 ENCOUNTER — Ambulatory Visit (HOSPITAL_COMMUNITY): Admission: RE | Admit: 2024-01-08 | Source: Home / Self Care | Admitting: Orthopedic Surgery

## 2024-01-08 ENCOUNTER — Ambulatory Visit: Payer: Self-pay

## 2024-01-08 DIAGNOSIS — I5043 Acute on chronic combined systolic (congestive) and diastolic (congestive) heart failure: Secondary | ICD-10-CM

## 2024-01-08 SURGERY — ARTHROPLASTY, HIP, TOTAL, ANTERIOR APPROACH
Anesthesia: Spinal | Site: Hip | Laterality: Right

## 2024-01-08 NOTE — Telephone Encounter (Signed)
 FYI Only or Action Required?: Action required by provider: update on patient condition.  Patient was last seen in primary care on 09/09/2023 by [name] DASEN, MD.  Called Nurse Triage reporting Advice Only.  Patient called and told that he needs additional clearance by PCP before he can have surgery. Scheduled acute appointment for tomorrow at 9:30 AM.   Triage Disposition: Call PCP Now  Patient/caregiver understands and will follow disposition?: Yes  Copied from CRM [id number]. Topic: Clinical - Red Word Triage >> Jan 08, 2024  8:36 AM [name] wrote: Red Word that prompted transfer to Nurse Triage: hip pain need clearance for surgery    ----------------------------------------------------------------------- From previous Reason for Contact - Scheduling: Patient/patient representative is calling to schedule an appointment. Refer to attachments for appointment information. Reason for Disposition  [1] Follow-up call from patient regarding patient's clinical status AND [2] information urgent  Answer Assessment - Initial Assessment Questions 1. REASON FOR CALL or QUESTION: What is your reason for calling today? or How can I best     Patient called due to being told that his hip surgery had to be rescheduled due to needing clearance for surgery. Patient reports he has had multiple labs draws-was told yesterday that he was ready for surgery. Called today by [facility] health and told that his surgery was on hold. Patient is extremely frustrated by this. Call CAL-PCP is off today. Did make an appointment with PCP for tomorrow at 9:30 AM. Patient would appreciate any assistance from office.  2. CALLER: Document the source of call. (e.g., laboratory staff, caregiver or patient).     patient  Protocols used: PCP Call - No Triage-A-AH

## 2024-01-09 ENCOUNTER — Ambulatory Visit: Admitting: Family Medicine

## 2024-01-09 ENCOUNTER — Encounter: Payer: Self-pay | Admitting: Family Medicine

## 2024-01-09 VITALS — BP 152/86 | HR 105 | Temp 98.2°F | Ht 68.0 in | Wt 142.0 lb

## 2024-01-09 DIAGNOSIS — N179 Acute kidney failure, unspecified: Secondary | ICD-10-CM | POA: Diagnosis not present

## 2024-01-09 NOTE — Progress Notes (Signed)
 Subjective:    Patient ID: [name], male    DOB: 10/18/1970, 53 y.o.   MRN: [id number]  Patient is a 53 year old Caucasian gentleman with a history of nonischemic cardiomyopathy/congestive heart failure as well as an AICD placement.  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 He has a history of a heart transplant.  Currently seeing infectious disease for both Norcardia pneumonia and CMV:  RELEVANT TxID TIMELINE: Nocardia nova pulmonary infection 06/14/23: Admission for abd pain and cough.  06/16/23: CT chest RML mass-like consolidation and mediastinal lymphadenopathy (new c/w 02/2023).  06/17/23: Bronchoscopy performed. BAL GM 0.627, serum GM 0.507. Biopsy/BAL culture with Nocardia nova.  06/21/23: Started on TMP-SMX + imipenem.  06/23/23: MRI brain negative.  06/27/23: MRI R hip small effusion, findings most c/w R femoral head AVN and subchondral insufficiency. Discharged on CRO + TMP-SMX 2DS TID. 07/18/23 TxID follow up. Feeling well. Tolerating ABX regimen. Labs stable. PICC pulled, IV ceftriaxone  stopped. TMP/SMX reduced to 2DS BID (10mg /kg/d dosing). CXR improved RML consolidation 08/02/23 Cr 2.15, encouraged hydration 09/12/23 TxID follow up. Clinically improved but reported some grogginess each AM after taking meds. Cr 3.12 earlier in week when checked by PCP, 2.5 on repeat. Advised to hold metformin and rosuvastatin. Repeat chest CT with marked improvement in RML consolidation. Cystatin C 2.06 (eGFR 31). CMV ND (off valcyte ~79m). TMP/SMX continued. 09/26/23 New elevation in LFTs (AST 121 from 21, ALT 258 from 29, ALP 352). T bili WNL. Cr 1.7. CMV ND. 09/30/23 TMP/SMX held 10/03/23 TxID follow up. No acute concerns. Clinically improved. Cr 1.8. Transaminases remain elevated but improved (AST 88, ALT 166 ALP 267). Liver US  unremarkable. Transitioned to linezolid + azithromycin . QTc 11/21/23 Admission for CMV. CT chest with  resolution of previously noted pulmonary opacities. Linezolid stopped 2/2 cytopenias, brief transition to tedizolid the d/c 6/16. Continued on azithromycin  monotherapy   CMV 08/10/23: Completed 6 months of valGCV ppx, pre-emptive CMV monitoring x 3 months 10/30/23: CMV < 200, previously ND 11/10/23: Abd discomfort, cramping/bloating and diarrhea. Cr 2.61, WBC 4K, Plt 137K 11/13/23: CMV VL 20,000 11/15/23: Started PO valgcv 450mg  daily as outpatient (renally dosed for CrCl ~30) 11/19/23: Admitted for AKI, CMV treatment in setting of ongoing GI sxs. Serum cr 4.2, WBC 3.4, Hgb 8.6, Plt 134, CMV PCR 11,800. Started on IV ganciclovir 2.5 mg/kg q12. Linezolid to Tedizolid in the setting of worsening pancytopenia.  11/21/23 - 11/25/23: Admission to [facility] for CMV. Discharged on Gan 2.5 mg/kg q12.  12/05/23: CMV PCR 4750   Baseline creatine 1.6-1.8  Currently 2.5 as of 01/01/24.  Was as high as 3.12 in 3/25.  Patient is currently on valganciclovir.  This is for CMV.  His CMV titers were 200 at the beginning of the year, they rapidly increased to over 20,000.  They are back down to 4000 however he will be continuing this medication until his CMV titers are at appropriate levels.  Up to 50% of patients who take this medication can see elevations in the creatinine.  The patient states that [facility] transplant team attributes the rise in his creatinine to this medication.  I do not see any evidence of renal ultrasound to rule out obstructive nephropathy.  I do not see a urine protein creatinine ratio to evaluate for any other significant proteinuria that may suggest nephrotic syndrome etc. Past Medical History:  Diagnosis Date   AICD (automatic cardioverter/defibrillator) present    Arthritis  Cardiomyopathy, nonischemic (HCC)    takes Digoxin  daily and Carvedilol  daily   CHF (congestive heart failure) (HCC)    takes Lasix  daily as well Aldactone    Chronic back pain    Chronic systolic heart failure (HCC)     Cirrhosis (HCC)    Depression    takes Prozac  daily   Diabetes mellitus without complication (HCC)    GERD (gastroesophageal reflux disease)    takes Omeprazole  daily   Hx of Alcohol consumption heavy    rare beer currently   hx of Tobacco abuse    quit smoking 2014   Hyperlipidemia    was on Simvastatin  but has been off a year   Insomnia    takes Ambien  as needed(just got script yesterday)   Orthostatic hypotension    Scoliosis    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 58 (XRT and chemo)   Past Surgical History:  Procedure Laterality Date   BIOPSY  10/31/2022   Procedure: BIOPSY;  Surgeon: Leigh Elspeth SQUIBB, MD;  Location: [facility] ENDOSCOPY;  Service: Gastroenterology;;   ORIN MEDIATE RELEASE Right 01/09/2013   Procedure: CARPAL TUNNEL RELEASE;  Surgeon: Rockey LITTIE Peru, MD;  [facility] NEURO ORS;  Service: Neurosurgery;  Laterality: Right;  RIGHT carpal tunnel release   CARPAL TUNNEL RELEASE Left 01/30/2013   Procedure: LEFT CARPAL TUNNEL RELEASE;  Surgeon: Rockey LITTIE Peru, MD;  [facility] NEUR[facility]S;  Service: Neurosurgery;  Laterality: Left;  LEFT Carpal Tunnel release   CHEST TUBE INSERTION Right 09/09/2013   Procedure: INSERTION PLEURAL DRAINAGE CATHETER;  Surgeon: Maude Fleeta Ochoa, MD;  Location: [facility] OR;  Service: Thoracic;  Laterality: Right;   COLONOSCOPY WITH PROPOFOL  N/A 10/31/2022   Procedure: COLONOSCOPY WITH PROPOFOL ;  Surgeon: Leigh Elspeth SQUIBB, MD;  Location: [facility] ENDOSCOPY;  Service: Gastroenterology;  Laterality: N/A;   HYDROCELE EXCISION Right 04/11/2017   Procedure: RIGHT HYDROCELECTOMY ADULT;  Surgeon: Watt Rush, MD;  Location: [facility] ORS;  Service: Urology;  Laterality: Right;   hydrocelectomy  2008   ICD  05/25/2011   [facility] Scientific [employer] SG lead/Energen single chamber device   IMPLANTABLE CARDIOVERTER DEFIBRILLATOR IMPLANT N/A 05/25/2011   Procedure: IMPLANTABLE CARDIOVERTER DEFIBRILLATOR IMPLANT;  Surgeon: Danelle LELON Birmingham, MD;  Location: [facility] CATH LAB;  Service:  Cardiovascular;  Laterality: N/A;   IR FLUORO GUIDE CV LINE RIGHT  01/16/2023   IR US  GUIDE VASC ACCESS RIGHT  01/16/2023   NEPHRECTOMY  36 yrs ago   Wilms Tumor   POLYPECTOMY  10/31/2022   Procedure: POLYPECTOMY;  Surgeon: Leigh Elspeth SQUIBB, MD;  [facility] ENDOSCOPY;  Service: Gastroenterology;;   RIGHT HEART CATH N/A 01/29/2019   Procedure: RIGHT HEART CATH;  Surgeon: Cherrie Toribio SAUNDERS, MD;  [facility] INVASIVE CV LAB;  Service: Cardiovascular;  Laterality: N/A;   RIGHT HEART CATH N/A 09/08/2019   Procedure: RIGHT HEART CATH;  Surgeon: Cherrie Toribio SAUNDERS, MD;  [facility] INVASIVE CV LAB;  Service: Cardiovascular;  Laterality: N/A;   RIGHT HEART CATH N/A 01/15/2023   Procedure: RIGHT HEART CATH;  Surgeon: Cherrie Toribio SAUNDERS, MD;  [facility] INVASIVE CV LAB;  Service: Cardiovascular;  Laterality: N/A;   RIGHT/LEFT HEART CATH AND CORONARY ANGIOGRAPHY N/A 02/12/2017   Procedure: RIGHT/LEFT HEART CATH AND CORONARY ANGIOGRAPHY;  Surgeon: Cherrie Toribio SAUNDERS, MD;  [facility] INVASIVE CV LAB;  Service: Cardiovascular;  Laterality: N/A;   ULNAR NERVE TRANSPOSITION Right 01/09/2013   Procedure: ULNAR NERVE DECOMPRESSION/TRANSPOSITION;  Surgeon: Rockey LITTIE Peru, MD;  [facility] NEURO  ORS;  Service: Neurosurgery;  Laterality: Right;  RIGHT ulnar nerve decompression   Current Outpatient Medications on File Prior to Visit  Medication Sig Dispense Refill   aspirin  81 MG tablet Take 81 mg by mouth in the morning.     atovaquone (MEPRON) 750 MG/5ML suspension Take 750 mg by mouth daily.     azithromycin  (ZITHROMAX ) 500 MG tablet Take 500 mg by mouth in the morning.     Blood Glucose Monitoring Suppl DEVI 1 each by Does not apply route in the morning, at noon, and at bedtime. May substitute to any manufacturer covered by patient's insurance. 1 each 0   cetirizine  (ZYRTEC ) 10 MG tablet Take 1 tablet (10 mg total) by mouth daily. (Patient not taking: Reported on 12/25/2023) 90 tablet 3   insulin regular  (NOVOLIN R) 100 units/mL injection Inject into the skin as needed for high blood sugar. Sliding scale: 70-200: NO extra regular insulin, 201-250: Take 2 extra units, 251-300: Take 4 extra units, 301-350: take 6 units, 351-400:take 8 units, greater than 400: take 10 extra units and call your doctor     labetalol  (NORMODYNE ) 200 MG tablet Take 200 mg by mouth 2 (two) times daily.     MAGNESIUM -OXIDE 400 (240 Mg) MG tablet Take 400 mg by mouth in the morning.     metFORMIN (GLUCOPHAGE-XR) 500 MG 24 hr tablet Take 500 mg by mouth in the morning.     mycophenolate (CELLCEPT) 250 MG capsule Take 250 mg by mouth every 12 (twelve) hours.     oxyCODONE  (OXY IR/ROXICODONE ) 5 MG immediate release tablet Take 1 tablet (5 mg total) by mouth 3 (three) times daily as needed for severe pain (pain score 7-10). 60 tablet 0   pantoprazole  (PROTONIX ) 40 MG tablet Take 40 mg by mouth daily before breakfast.     predniSONE  (DELTASONE ) 2.5 MG tablet Take 2.5 mg by mouth daily with breakfast.     tacrolimus  (PROGRAF ) 1 MG capsule Take 3 mg by mouth 2 (two) times daily.     valGANciclovir (VALCYTE) 450 MG tablet Take 450 mg by mouth in the morning.     No current facility-administered medications on file prior to visit.   Allergies  Allergen Reactions   Ibuprofen Hives   Nsaids     ELEVATED LFT'S   Social History   Socioeconomic History   Marital status: Single    Spouse name: Not on file   Number of children: 1   Years of education: Not on file   Highest education level: Not on file  Occupational History   Occupation: Full time    Comment: Engineer, petroleum  Tobacco Use   Smoking status: Former    Current packs/day: 0.00    Average packs/day: 0.3 packs/day for 20.0 years (5.0 ttl pk-yrs)    Types: Cigarettes    Start date: 09/07/1992    Quit date: 09/07/2012    Years since quitting: 11.3   Smokeless tobacco: Current    Types: Snuff  Vaping Use   Vaping status: Never Used  Substance and Sexual Activity    Alcohol use: Not Currently    Comment: histoorically a heavy drinker (beer only), none for a couple months    Drug use: No   Sexual activity: Not Currently  Other Topics Concern   Not on file  Social History Narrative   Not on file   Social Drivers of Health   Financial Resource Strain: Low Risk  (11/21/2023)   Received from [facility]   Overall Financial Resource Strain (CARDIA)    Difficulty of Paying Living Expenses: Not hard at all  Food Insecurity: No Food Insecurity (11/21/2023)   Received from [facility]   Hunger Vital Sign    Within the past 12 months, you worried that your food would run out before you got the money to buy more.: Never true    Within the past 12 months, the food you bought just didn't last and you didn't have money to get more.: Never true  Transportation Needs: No Transportation Needs (11/22/2023)   Received [facility] - [facility]    In the past 12 months, has lack of transportation kept you from medical appointments or from getting medications?: No    Lack of Transportation (Non-Medical): No  Physical Activity: Inactive (10/09/2023)   Exercise Vital Sign    Days of Exercise per Week: 0 days    Minutes of Exercise per Session: 0 min  Stress: No Stress Concern Present (10/09/2023)   [employer] of Occupational [facility] - Occupational Stress Questionnaire    Feeling of Stress : Not at all  Social Connections: Moderately Isolated (10/09/2023)   Social Connection and Isolation Panel    Frequency of Communication with Friends and Family: Three times a week    Frequency of Social Gatherings with Friends and Family: Three times a week    Attends Religious Services: 1 to 4 times per year    Active Member of Clubs or Organizations: No    Attends Banker Meetings: Never    Marital Status: Never married  Intimate Partner Violence: Not At Risk (10/09/2023)   Humiliation,  Afraid, Rape, and Kick questionnaire    Fear of Current or Ex-Partner: No    Emotionally Abused: No    Physically Abused: No    Sexually Abused: No     Review of Systems  All other systems reviewed and are negative.      Objective:   Physical Exam Vitals reviewed.  Constitutional:      General: He is not in acute distress.    Appearance: Normal appearance. He is normal weight. He is not ill-appearing or toxic-appearing.  Cardiovascular:     Rate and Rhythm: Normal rate and regular rhythm.     Heart sounds: Normal heart sounds. No murmur heard.    No friction rub. No gallop.  Pulmonary:     Effort: Pulmonary effort is normal. No respiratory distress.     Breath sounds: Normal breath sounds. No stridor. No wheezing, rhonchi or rales.  Chest:    Abdominal:     General: Abdomen is flat. Bowel sounds are normal.     Palpations: Abdomen is soft.  Musculoskeletal:        General: No swelling or tenderness.     Right lower leg: No edema.     Left lower leg: No edema.  Skin:    Findings: No erythema.  Neurological:     General: No focal deficit present.     Mental Status: He is alert and oriented to person, place, and time. Mental status is at baseline.     Cranial Nerves: No cranial nerve deficit.     Motor: No weakness.     Gait: Gait normal.          Assessment & Plan:  AKI (acute kidney injury) (HCC) - Plan: Urinalysis, Routine w reflex microscopic, Protein / Creatinine Ratio, Urine Patient states  that his rising creatinine has been attributed to valganciclovir which she is taking for CMV.  His baseline creatinine is around 1.8.  His new baseline seems to be around 2.5.  He will be on this medication until his CMV titers have improved.  He states that [employer] is following this.  I have recommended a renal ultrasound to rule out obstructive nephropathy.  Also recommended checking a urinalysis to rule out possible signs of glomerulonephritis as well as a urine  protein creatinine ratio to rule out nephrotic range proteinuria.  Patient is comfortable doing the urine sample but does not want to postpone surgery for an ultrasound.  He is comfortable assuming that his creatinine is due to his valganciclovir.  As long as his urine protein creatinine ratio and urinalysis are within normal limits I see no reason he cannot proceed with surgery.

## 2024-01-10 ENCOUNTER — Ambulatory Visit: Payer: Self-pay | Admitting: Family Medicine

## 2024-01-10 LAB — URINALYSIS, ROUTINE W REFLEX MICROSCOPIC
Bacteria, UA: NONE SEEN /HPF
Bilirubin Urine: NEGATIVE
Glucose, UA: NEGATIVE
Hgb urine dipstick: NEGATIVE
Hyaline Cast: NONE SEEN /LPF
Ketones, ur: NEGATIVE
Leukocytes,Ua: NEGATIVE
Nitrite: NEGATIVE
RBC / HPF: NONE SEEN /HPF (ref 0–2)
Specific Gravity, Urine: 1.013 (ref 1.001–1.035)
Squamous Epithelial / HPF: NONE SEEN /HPF (ref <=5)
WBC, UA: NONE SEEN /HPF (ref 0–5)
pH: 5.5 (ref 5.0–8.0)

## 2024-01-10 LAB — PROTEIN / CREATININE RATIO, URINE
Creatinine, Urine: 102 mg/dL (ref 20–320)
Protein/Creat Ratio: 324 mg/g{creat} — ABNORMAL HIGH (ref 25–148)
Protein/Creatinine Ratio: 0.324 mg/mg{creat} — ABNORMAL HIGH (ref 0.025–0.148)
Total Protein, Urine: 33 mg/dL — ABNORMAL HIGH (ref 5–25)

## 2024-01-10 LAB — MICROSCOPIC MESSAGE

## 2024-01-15 DIAGNOSIS — Z941 Heart transplant status: Secondary | ICD-10-CM | POA: Diagnosis not present

## 2024-01-15 DIAGNOSIS — Z48298 Encounter for aftercare following other organ transplant: Secondary | ICD-10-CM | POA: Diagnosis not present

## 2024-01-17 NOTE — Progress Notes (Addendum)
 Anesthesia Review:  PCP: Butler Burr LOV 01/09/24  Cardiologist : Juliene Bile- LOV 12/19/2023  Inf Disease- Jolaine  [name]-  Taylor-EP   PPM/ ICD: Hx of ICD no longer has ICD  Device Orders: Rep Notified:  Inf Disease - Dr Jolaine LOV 12/19/23  Transplant Clinic  LOV at [facility] 12/19/2023. With Glean Metro   Chest x-ray : 06/14/23  CT Chest- 06/14/23  EKG : 06/17/23 on chart  Echo : 2023  Stress test: 11/08/22  Cardiac Cath :  01/15/23   Activity level:  Sleep Study/ CPAP : Fasting Blood Sugar :      / Checks Blood Sugar -- times a day:   DM- - PT is borderline.  per pt he is no longer a diabetic- no metformin in 2 weeks per phone call on 01/17/24.   Also no longer on SSInsulin  Hgba1c- 12/30/23- 6.7     Blood Thinner/ Instructions /Last Dose: ASA / Instructions/ Last Dose :    12/30/23- preop labs of pcr, cbc and BMP done  Creatinine elevated. DR Butler Burr LOV 01/09/24 and u/a and urine protein/creatinine ration completed .  Results in epic.   Surgery was rescheduled from 01/08/24 until 01/22/24.   Reviewed preop instructons with pt on 01/17/24.  PT voiced understanding. Pt still has soap at home.    Heart Transplant- 02/05/23    Cytomegalovirus    Preop phone cal due to rescheduling of surgery done on 01/17/24.  PT still hhas hibiclens .  REviewed preop instructions with pt .  P Tvoiced understanding.

## 2024-01-17 NOTE — Anesthesia Preprocedure Evaluation (Signed)
 Anesthesia Evaluation    Airway        Dental   Pulmonary former smoker          Cardiovascular      Neuro/Psych    GI/Hepatic   Endo/Other  diabetes    Renal/GU      Musculoskeletal   Abdominal   Peds  Hematology   Anesthesia Other Findings   Reproductive/Obstetrics                              Anesthesia Physical Anesthesia Plan  ASA:   Anesthesia Plan:    Post-op Pain Management:    Induction:   PONV Risk Score and Plan:   Airway Management Planned:   Additional Equipment:   Intra-op Plan:   Post-operative Plan:   Informed Consent:   Plan Discussed with:   Anesthesia Plan Comments: (See PAT note 01/17/2024)        Anesthesia Quick Evaluation

## 2024-01-17 NOTE — Progress Notes (Signed)
 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 former smoker with h/o GERD, CAD, CHF, s/p heart transplant 02/05/2023, cirrhosis (cardiogenic, ETOH), Wilms tumor s/p left nephrectomy as a child, CKD Stage III, right hip OA scheduled for above procedure 01/08/2024 with Dr. Rogue Fidel.    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 Last seen 12/19/2023. CT 09/12/2023 with near complete resolution of right middle lobe consolidation. At this visit IV ganciclovir stopped, transitioned to PO valganciclovir. PICC line removed 7/14. 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    Clearance received from ID which states, From an infectious disease perspective, Mr. Clagg is cleared for R THA. His ID issues are chronic and well-managed at this stage.   Pt last seen by cardiac transplant service 12/19/2023. Pt asymptomatic at this visit. Per notes in preparation for hip surgery biopsy to be done, if negative will wean prednisone  to 2.5 mg and restart MMF 250 mg BID. Per notes biopsy reassuring MMF resumed and prednisone  decreased to 2.5 mg.    Clearance from cardiology received which states pt is low to moderate risk.  Would give prednisone  40mg  on day of surgery and 20 mg next day and then back to usual dose   Clearance received from PCP which states pt is cleared medically.    Pt admitted in June for AKI, creatinine at highest 3.12. Per notes baseline creatinine 1.8.  Creatinine has been elevated since discharge, see below.  Discussed with PCP who advised pt to hold metformin and scheduled pt for repeat labs in their clinic.  Pt  declined visit, states he has had labs recently.  Discussed with Dr. Maryclare.  Pt can stay on schedule, evaluate DOS.  Lab recheck DOS.    01/01/2024 Creatinine 2.46 12/30/2023 Creatinine 2.66 12/19/2023 Creatinine 2.4 12/12/2023 Creatinine 2.21   Pt cancelled DOS by Dr. Paul.  He has since seen PCP who states his new baseline creatinine is around 2.5.  Can proceed with surgery.   Echo 12/19/2023 (Care Everywhere) NORMAL LEFT VENTRICULAR SYSTOLIC FUNCTION WITH NO LVH  ESTIMATED EF: 55%  DIASTOLIC FUNCTION NOT ASSESSED  NORMAL RIGHT VENTRICULAR SYSTOLIC FUNCTION  VALVULAR REGURGITATION: No AR, TRIVIAL MR, TRIVIAL PR, MILD TR  ESTIMATED RVSP: 29 mmHg  NO VALVULAR STENOSIS  NORMAL TRAVERSAL OF TRICUSPID VALVE FOR RVB    VS: There were no vitals taken for this visit.  PROVIDERS: Duanne Butler DASEN, MD   LABS: Labs reviewed: Acceptable for surgery. (all labs ordered are listed, but only abnormal results are displayed)  Labs Reviewed - No data to display   IMAGES:   EKG:   CV:  Past Medical History:  Diagnosis Date   AICD (automatic cardioverter/defibrillator) present    Arthritis    Cardiomyopathy, nonischemic (HCC)    takes Digoxin  daily and Carvedilol  daily   CHF (congestive heart failure) (HCC)    takes Lasix  daily as well Aldactone    Chronic back pain    Chronic systolic heart failure (HCC)    Cirrhosis (HCC)    Depression    takes Prozac  daily   Diabetes mellitus without complication (HCC)    GERD (gastroesophageal reflux disease)  takes Omeprazole  daily   Hx of Alcohol consumption heavy    rare beer currently   hx of Tobacco abuse    quit smoking 2014   Hyperlipidemia    was on Simvastatin  but has been off a year   Insomnia    takes Ambien  as needed(just got script yesterday)   Orthostatic hypotension    Scoliosis    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 68 (XRT and chemo)    Past Surgical History:  Procedure Laterality Date   BIOPSY  10/31/2022    Procedure: BIOPSY;  Surgeon: Leigh Elspeth SQUIBB, MD;  Location: [facility] ENDOSCOPY;  Service: Gastroenterology;;   ORIN MEDIATE RELEASE Right 01/09/2013   Procedure: CARPAL TUNNEL RELEASE;  Surgeon: Rockey LITTIE Peru, MD;  [facility] NEURO ORS;  Service: Neurosurgery;  Laterality: Right;  RIGHT carpal tunnel release   CARPAL TUNNEL RELEASE Left 01/30/2013   Procedure: LEFT CARPAL TUNNEL RELEASE;  Surgeon: Rockey LITTIE Peru, MD;  [facility]S;  Service: Neurosurgery;  Laterality: Left;  LEFT Carpal Tunnel release   CHEST TUBE INSERTION Right 09/09/2013   Procedure: INSERTION PLEURAL DRAINAGE CATHETER;  Surgeon: Maude Fleeta Ochoa, MD;  Location: [facility] OR;  Service: Thoracic;  Laterality: Right;   COLONOSCOPY WITH PROPOFOL  N/A 10/31/2022   Procedure: COLONOSCOPY WITH PROPOFOL ;  Surgeon: Leigh Elspeth SQUIBB, MD;  Location[facility] ENDOSCOPY;  Service: Gastroenterology;  Laterality: N/A;   HYDROCELE EXCISION Right 04/11/2017   Procedure: RIGHT HYDROCELECTOMY ADULT;  Surgeon: Watt Rush, MD;  Location: [facility]S;  Service: Urology;  Laterality: Right;   hydrocelectomy  2008   ICD  05/25/2011   [facility] Scientific [employer] SG lead/Energen single chamber device   IMPLANTABLE CARDIOVERTER DEFIBRILLATOR IMPLANT N/A 05/25/2011   Procedure: IMPLANTABLE CARDIOVERTER DEFIBRILLATOR IMPLANT;  Surgeon: Danelle LELON Birmingham, MD;  Location: [facility] CATH LAB;  Service: Cardiovascular;  Laterality: N/A;   IR FLUORO GUIDE CV LINE RIGHT  01/16/2023   IR US  GUIDE VASC ACCESS RIGHT  01/16/2023   NEPHRECTOMY  36 yrs ago   Wilms Tumor   POLYPECTOMY  10/31/2022   Procedure: POLYPECTOMY;  Surgeon: Leigh Elspeth SQUIBB, MD;  [facility] ENDOSCOPY;  Service: Gastroenterology;;   RIGHT HEART CATH N/A 01/29/2019   Procedure: RIGHT HEART CATH;  Surgeon: Cherrie Toribio SAUNDERS, MD;  [facility] INVASIVE CV LAB;  Service: Cardiovascular;  Laterality: N/A;   RIGHT HEART CATH N/A 09/08/2019   Procedure: RIGHT HEART CATH;  Surgeon: Cherrie Toribio SAUNDERS, MD;   [facility]VAS[facility] CV LAB;  Service: Cardiovascular;  Laterality: N/A;   RIGHT HEART CATH N/A 01/15/2023   Procedure: RIGHT HEART CATH;  Surgeon: Cherrie Toribio SAUNDERS, MD;  [facility]VAS[facility] CV LAB;  Service: Cardiovascular;  Laterality: N/A;   RIGHT/LEFT HEART CATH AND CORONARY ANGIOGRAPHY N/A 02/12/2017   Procedure: RIGHT/LEFT HEART CATH AND CORONARY ANGIOGRAPHY;  Surgeon: Cherrie Toribio SAUNDERS, MD;  [facility] INVAS[facility] CV LAB;  Service: Cardiovascular;  Laterality: N/A;   ULNAR NERVE TRANSPOSITION Right 01/09/2013   Procedure: ULNAR NERVE DECOMPRESSION/TRANSPOSITION;  Surgeon: Rockey LITTIE Peru, MD;  [facility]O [facility]S;  Service: Neurosurgery;  Laterality: Right;  RIGHT ulnar nerve decompression    MEDICATIONS: No current facility-administered medications for this encounter.    aspirin  81 MG tablet   atovaquone (MEPRON) 750 MG/5ML suspension   azithromycin  (ZITHROMAX ) 500 MG tablet   Blood Glucose Monitoring Suppl DEVI   cetirizine  (ZYRTEC ) 10 MG tablet   insulin regular (NOVOLIN R) 100 units/mL injection   labetalol  (NORMODYNE ) 200  MG tablet   MAGNESIUM -OXIDE 400 (240 Mg) MG tablet   metFORMIN (GLUCOPHAGE-XR) 500 MG 24 hr tablet   mycophenolate (CELLCEPT) 250 MG capsule   oxyCODONE  (OXY IR/ROXICODONE ) 5 MG immediate release tablet   pantoprazole  (PROTONIX ) 40 MG tablet   predniSONE  (DELTASONE ) 2.5 MG tablet   tacrolimus  (PROGRAF ) 1 MG capsule   valGANciclovir (VALCYTE) 450 MG tablet     [facility] Ward, PA-C [facility] Pre-Surgical Testing [phone]

## 2024-01-20 ENCOUNTER — Ambulatory Visit: Payer: Self-pay | Admitting: Student

## 2024-01-20 DIAGNOSIS — Z01818 Encounter for other preprocedural examination: Secondary | ICD-10-CM

## 2024-01-22 ENCOUNTER — Encounter (HOSPITAL_COMMUNITY): Admission: RE | Payer: Self-pay | Source: Home / Self Care

## 2024-01-22 ENCOUNTER — Encounter (HOSPITAL_COMMUNITY): Payer: Self-pay | Admitting: Physician Assistant

## 2024-01-22 ENCOUNTER — Ambulatory Visit (HOSPITAL_COMMUNITY): Admission: RE | Admit: 2024-01-22 | Source: Home / Self Care | Admitting: Orthopedic Surgery

## 2024-01-22 SURGERY — ARTHROPLASTY, HIP, TOTAL, ANTERIOR APPROACH
Anesthesia: Spinal | Site: Hip | Laterality: Right

## 2024-01-29 DIAGNOSIS — Z941 Heart transplant status: Secondary | ICD-10-CM | POA: Diagnosis not present

## 2024-01-29 DIAGNOSIS — Z48298 Encounter for aftercare following other organ transplant: Secondary | ICD-10-CM | POA: Diagnosis not present

## 2024-01-31 ENCOUNTER — Telehealth: Payer: Self-pay

## 2024-01-31 ENCOUNTER — Other Ambulatory Visit: Payer: Self-pay | Admitting: Family Medicine

## 2024-01-31 MED ORDER — OXYCODONE HCL 5 MG PO TABS: 5.0000 mg | ORAL_TABLET | Freq: Three times a day (TID) | ORAL | 0 refills | Status: DC | PRN

## 2024-01-31 NOTE — Telephone Encounter (Unsigned)
 Copied from CRM #[id number]. Topic: Clinical - Medication Refill >> Jan 31, 2024  8:25 AM Amy B wrote: Medication: oxyCODONE  (OXY IR/ROXICODONE ) 5 MG immediate release tablet  Has the patient contacted their pharmacy? No (Agent: If no, request that the patient contact the pharmacy for the refill. If patient does not wish to contact the pharmacy document the reason why and proceed with request.) (Agent: If yes, when and what did the pharmacy advise?)  This is the patient's preferred pharmacy:  [facility] [address] [place] - [address] [place] [address] Phone: [phone] Fax: [phone]  Is this the correct pharmacy for this prescription? Yes If no, delete pharmacy and type the correct one.   Has the prescription been filled recently? No  Is the patient out of the medication? Yes  Has the patient been seen for an appointment in the last year OR does the patient have an upcoming appointment? Yes  Can we respond through MyChart? No  Agent: Please be advised that Rx refills may take up to 3 business days. We ask that you follow-up with your pharmacy.

## 2024-01-31 NOTE — Telephone Encounter (Signed)
 Copied from CRM #[id number]. Topic: General - Other >> Jan 31, 2024  8:29 AM Amy B wrote: Reason for CRM: Patient states his hip surgery was cancelled again.

## 2024-02-06 ENCOUNTER — Encounter

## 2024-02-13 DIAGNOSIS — B259 Cytomegaloviral disease, unspecified: Secondary | ICD-10-CM | POA: Diagnosis not present

## 2024-02-13 DIAGNOSIS — Z4821 Encounter for aftercare following heart transplant: Secondary | ICD-10-CM | POA: Diagnosis not present

## 2024-02-13 DIAGNOSIS — N1832 Chronic kidney disease, stage 3b: Secondary | ICD-10-CM | POA: Diagnosis not present

## 2024-02-13 DIAGNOSIS — Z792 Long term (current) use of antibiotics: Secondary | ICD-10-CM | POA: Diagnosis not present

## 2024-02-13 DIAGNOSIS — D849 Immunodeficiency, unspecified: Secondary | ICD-10-CM | POA: Diagnosis not present

## 2024-02-13 DIAGNOSIS — A430 Pulmonary nocardiosis: Secondary | ICD-10-CM | POA: Diagnosis not present

## 2024-02-13 DIAGNOSIS — Z8619 Personal history of other infectious and parasitic diseases: Secondary | ICD-10-CM | POA: Diagnosis not present

## 2024-02-13 DIAGNOSIS — Z941 Heart transplant status: Secondary | ICD-10-CM | POA: Diagnosis not present

## 2024-02-13 DIAGNOSIS — Z79899 Other long term (current) drug therapy: Secondary | ICD-10-CM | POA: Diagnosis not present

## 2024-02-13 DIAGNOSIS — N179 Acute kidney failure, unspecified: Secondary | ICD-10-CM | POA: Diagnosis not present

## 2024-02-13 DIAGNOSIS — R9431 Abnormal electrocardiogram [ECG] [EKG]: Secondary | ICD-10-CM | POA: Diagnosis not present

## 2024-02-13 DIAGNOSIS — Z48298 Encounter for aftercare following other organ transplant: Secondary | ICD-10-CM | POA: Diagnosis not present

## 2024-02-13 DIAGNOSIS — Z789 Other specified health status: Secondary | ICD-10-CM | POA: Diagnosis not present

## 2024-02-13 DIAGNOSIS — J9690 Respiratory failure, unspecified, unspecified whether with hypoxia or hypercapnia: Secondary | ICD-10-CM | POA: Diagnosis not present

## 2024-02-13 DIAGNOSIS — I129 Hypertensive chronic kidney disease with stage 1 through stage 4 chronic kidney disease, or unspecified chronic kidney disease: Secondary | ICD-10-CM | POA: Diagnosis not present

## 2024-02-13 DIAGNOSIS — D61818 Other pancytopenia: Secondary | ICD-10-CM | POA: Diagnosis not present

## 2024-02-13 DIAGNOSIS — Z125 Encounter for screening for malignant neoplasm of prostate: Secondary | ICD-10-CM | POA: Diagnosis not present

## 2024-02-20 ENCOUNTER — Ambulatory Visit: Payer: Self-pay | Admitting: Family Medicine

## 2024-02-20 NOTE — Telephone Encounter (Signed)
 Pt states he had a renal ultrasound last week before they would do the surgery. Pt states he has called the surgeon's office at [facility] every day this week with no answer. Pt is not sure if he needs to go somewhere else for the surgery and is requesting help with this.  Pt states he spoke with a nurse with Dr. Clara office a couple of weeks ago and wanted to talk with her again. Pt states she knows what is going on.      Copied from CRM [id number]. Topic: Clinical - Medical Advice >> Feb 20, 2024  2:25 PM Debby BROCKS wrote: Reason for CRM: Patient would like to speak to a nurse for advice and information about his upcoming surgery

## 2024-02-24 ENCOUNTER — Telehealth: Payer: Self-pay

## 2024-02-24 ENCOUNTER — Other Ambulatory Visit: Payer: Self-pay | Admitting: Family Medicine

## 2024-02-24 MED ORDER — OXYCODONE HCL 5 MG PO TABS: 5.0000 mg | ORAL_TABLET | Freq: Three times a day (TID) | ORAL | 0 refills | Status: DC | PRN

## 2024-02-24 NOTE — Telephone Encounter (Signed)
 Copied from CRM [id number]. Topic: Clinical - Medical Advice >> Feb 24, 2024  3:11 PM DeAngela L wrote: Reason for CRM: patient calling to ask if Dr Breck nurse Ronal Bradley could call him about his hip concern and also Emerge Otho   Pt num [phone] ok to leave detail message

## 2024-02-26 DIAGNOSIS — Z941 Heart transplant status: Secondary | ICD-10-CM | POA: Diagnosis not present

## 2024-03-10 ENCOUNTER — Ambulatory Visit: Admitting: Family Medicine

## 2024-03-10 ENCOUNTER — Encounter: Payer: Self-pay | Admitting: Family Medicine

## 2024-03-10 VITALS — BP 166/96 | HR 107 | Temp 98.4°F | Ht 68.0 in | Wt 137.0 lb

## 2024-03-10 DIAGNOSIS — Z0001 Encounter for general adult medical examination with abnormal findings: Secondary | ICD-10-CM | POA: Diagnosis not present

## 2024-03-10 DIAGNOSIS — D485 Neoplasm of uncertain behavior of skin: Secondary | ICD-10-CM

## 2024-03-10 DIAGNOSIS — N1831 Chronic kidney disease, stage 3a: Secondary | ICD-10-CM

## 2024-03-10 DIAGNOSIS — L989 Disorder of the skin and subcutaneous tissue, unspecified: Secondary | ICD-10-CM

## 2024-03-10 DIAGNOSIS — D849 Immunodeficiency, unspecified: Secondary | ICD-10-CM | POA: Diagnosis not present

## 2024-03-10 DIAGNOSIS — Z941 Heart transplant status: Secondary | ICD-10-CM | POA: Diagnosis not present

## 2024-03-10 DIAGNOSIS — Z23 Encounter for immunization: Secondary | ICD-10-CM | POA: Diagnosis not present

## 2024-03-10 DIAGNOSIS — E1169 Type 2 diabetes mellitus with other specified complication: Secondary | ICD-10-CM

## 2024-03-10 DIAGNOSIS — I5022 Chronic systolic (congestive) heart failure: Secondary | ICD-10-CM

## 2024-03-10 DIAGNOSIS — Z7984 Long term (current) use of oral hypoglycemic drugs: Secondary | ICD-10-CM | POA: Diagnosis not present

## 2024-03-10 DIAGNOSIS — C4492 Squamous cell carcinoma of skin, unspecified: Secondary | ICD-10-CM | POA: Diagnosis not present

## 2024-03-10 DIAGNOSIS — C44622 Squamous cell carcinoma of skin of right upper limb, including shoulder: Secondary | ICD-10-CM | POA: Diagnosis not present

## 2024-03-10 MED ORDER — COLCHICINE 0.6 MG PO TABS: ORAL_TABLET | ORAL | 1 refills | Status: AC

## 2024-03-10 MED ORDER — FLUTICASONE PROPIONATE 50 MCG/ACT NA SUSP: 2.0000 | Freq: Every day | NASAL | 6 refills | Status: AC

## 2024-03-10 MED ORDER — NEBIVOLOL HCL 10 MG PO TABS: 10.0000 mg | ORAL_TABLET | Freq: Every day | ORAL | 3 refills | Status: AC

## 2024-03-10 MED ORDER — CETIRIZINE HCL 10 MG PO TABS: 10.0000 mg | ORAL_TABLET | Freq: Every day | ORAL | 3 refills | Status: AC

## 2024-03-10 NOTE — Addendum Note (Signed)
 Addended by: ANGELENA RONAL BRADLEY K on: 03/10/2024 12:07 PM   Modules accepted: Orders

## 2024-03-10 NOTE — Progress Notes (Signed)
 Subjective:    Patient ID: [name], male    DOB: 05-13-1971, 53 y.o.   MRN: [id number]  HPI Patient was originally scheduled for a complete physical exam.  However he has a rapidly growing mass on his right arm.  It is a dome-shaped nodular lesion 2 cm in diameter with a hyperkeratotic central core.  It bleeds easily.  He states that it appeared over the last month.  I suspect that this is likely a skin cancer.  He request that I go ahead and remove that today.  He is due for a flu shot as well as the shingles shot.  According to his records he is also due for pneumonia shot however I anticipate that he has likely had this with his transplant team at [facility].  I encouraged him to ask the transplant team to do to see if he has had a vaccine recently.  If not we will be happy to give him the pneumonia shot.  However I did recommend the flu and the shingles vaccine today.  Lab work has been checked recently due to.  His CBC was normal on September 4.  He just had a CMP on September 17.  His creatinine had improved to 1.7.  His creatinine had been near 3 earlier this summer however this was due to medication that he was taking because of his recent norcardia pneumonia and CMV.   Past Medical History:  Diagnosis Date  . AICD (automatic cardioverter/defibrillator) present   . Arthritis   . Cardiomyopathy, nonischemic (HCC)    takes Digoxin  daily and Carvedilol  daily  . CHF (congestive heart failure) (HCC)    takes Lasix  daily as well Aldactone   . Chronic back pain   . Chronic systolic heart failure (HCC)   . Cirrhosis (HCC)   . Depression    takes Prozac  daily  . Diabetes mellitus without complication (HCC)   . GERD (gastroesophageal reflux disease)    takes Omeprazole  daily  . Hx of Alcohol consumption heavy    rare beer currently  . hx of Tobacco abuse    quit smoking 2014  . Hyperlipidemia    was on Simvastatin  but has been off a year  . Insomnia    takes Ambien  as needed(just got  script yesterday)  . Orthostatic hypotension   . Scoliosis   . 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 62 (XRT and chemo)   Past Surgical History:  Procedure Laterality Date  . BIOPSY  10/31/2022   Procedure: BIOPSY;  Surgeon: Leigh Elspeth SQUIBB, MD;  Location: [facility] ENDOSCOPY;  Service: Gastroenterology;;  . ORIN MEDIATE RELEASE Right 01/09/2013   Procedure: CARPAL TUNNEL RELEASE;  Surgeon: Rockey LITTIE Peru, MD;  Location: [facility] NEURO ORS;  Service: Neurosurgery;  Laterality: Right;  RIGHT carpal tunnel release  . CARPAL TUNNEL RELEASE Left 01/30/2013   Procedure: LEFT CARPAL TUNNEL RELEASE;  Surgeon: Rockey LITTIE Peru, MD;  Location: [facility] NEURO ORS;  Service: Neurosurgery;  Laterality: Left;  LEFT Carpal Tunnel release  . CHEST TUBE INSERTION Right 09/09/2013   Procedure: INSERTION PLEURAL DRAINAGE CATHETER;  Surgeon: Maude Fleeta Ochoa, MD;  Location: [facility] OR;  Service: Thoracic;  Laterality: Right;  . COLONOSCOPY WITH PROPOFOL  N/A 10/31/2022   Procedure: COLONOSCOPY WITH PROPOFOL ;  Surgeon: Leigh Elspeth SQUIBB, MD;  Location: [facility] ENDOSCOPY;  Service: Gastroenterology;  Laterality: N/A;  . HYDROCELE EXCISION Right 04/11/2017   Procedure: RIGHT HYDROCELECTOMY ADULT;  Surgeon: Watt Rush, MD;  Location: [facility] ORS;  Service:  Urology;  Laterality: Right;  . hydrocelectomy  2008  . ICD  05/25/2011   [employer] End[employer]ak [employer] SG lead/Energen single chamber device  . IMPLANTABLE CARDIOVERTER DEFIBRILLATOR IMPLANT N/A 05/25/2011   Procedure: IMPLANTABLE CARDIOVERTER DEFIBRILLATOR IMPLANT;  Surgeon: Danelle LELON Birmingham, MD;  Location: [facility] CATH LAB;  Service: Cardiovascular;  Laterality: N/A;  . IR FLUORO GUIDE CV LINE RIGHT  01/16/2023  . IR US  GUIDE VASC ACCESS RIGHT  01/16/2023  . NEPHRECTOMY  36 yrs ago   Wilms Tumor  . POLYPECTOMY  10/31/2022   Procedure: POLYPECTOMY;  Surgeon: Leigh Elspeth SQUIBB, MD;  Location: [facility] ENDOSCOPY;  Service: Gastroenterology;;  . RIGHT HEART CATH N/A 01/29/2019   Procedure: RIGHT HEART CATH;   Surgeon: Cherrie Toribio SAUNDERS, MD;  Location: [facility] INVASIVE CV LAB;  Service: Cardiovascular;  Laterality: N/A;  . RIGHT HEART CATH N/A 09/08/2019   Procedure: RIGHT HEART CATH;  Surgeon: Cherrie Toribio SAUNDERS, MD;  Location: [facility] INVASIVE CV LAB;  Service: Cardiovascular;  Laterality: N/A;  . RIGHT HEART CATH N/A 01/15/2023   Procedure: RIGHT HEART CATH;  Surgeon: Cherrie Toribio SAUNDERS, MD;  Location: [facility] INVASIVE CV LAB;  Service: Cardiovascular;  Laterality: N/A;  . RIGHT/LEFT HEART CATH AND CORONARY ANGIOGRAPHY N/A 02/12/2017   Procedure: RIGHT/LEFT HEART CATH AND CORONARY ANGIOGRAPHY;  Surgeon: Cherrie Toribio SAUNDERS, MD;  Location: [facility] INVASIVE CV LAB;  Service: Cardiovascular;  Laterality: N/A;  . ULNAR NERVE TRANSPOSITION Right 01/09/2013   Procedure: ULNAR NERVE DECOMPRESSION/TRANSPOSITION;  Surgeon: Rockey LITTIE Peru, MD;  Location: [facility] NEURO ORS;  Service: Neurosurgery;  Laterality: Right;  RIGHT ulnar nerve decompression   Current Outpatient Medications on File Prior to Visit  Medication Sig Dispense Refill  . aspirin  81 MG tablet Take 81 mg by mouth in the morning.    SABRA atovaquone (MEPRON) 750 MG/5ML suspension Take 750 mg by mouth daily.    . Blood Glucose Monitoring Suppl DEVI 1 each by Does not apply route in the morning, at noon, and at bedtime. May substitute to any manufacturer covered by patient's insurance. 1 each 0  . insulin  regular (NOVOLIN R) 100 units/mL injection Inject into the skin as needed for high blood sugar. Sliding scale: 70-200: NO extra regular insulin , 201-250: Take 2 extra units, 251-300: Take 4 extra units, 301-350: take 6 units, 351-400:take 8 units, greater than 400: take 10 extra units and call your doctor    . MAGNESIUM -OXIDE 400 (240 Mg) MG tablet Take 400 mg by mouth in the morning.    . metFORMIN (GLUCOPHAGE-XR) 500 MG 24 hr tablet Take 500 mg by mouth in the morning.    . mycophenolate (CELLCEPT) 250 MG capsule Take 250 mg by mouth every 12 (twelve) hours.    . oxyCODONE   (OXY IR/ROXICODONE ) 5 MG immediate release tablet Take 1 tablet (5 mg total) by mouth 3 (three) times daily as needed for severe pain (pain score 7-10). 60 tablet 0  . pantoprazole  (PROTONIX ) 40 MG tablet Take 40 mg by mouth daily before breakfast.    . predniSONE  (DELTASONE ) 2.5 MG tablet Take 2.5 mg by mouth daily with breakfast.    . tacrolimus  (PROGRAF ) 1 MG capsule Take 3 mg by mouth 2 (two) times daily.    . valGANciclovir (VALCYTE) 450 MG tablet Take 450 mg by mouth in the morning.    . azithromycin  (ZITHROMAX ) 500 MG tablet Take 500 mg by mouth in the morning. (Patient not taking: Reported on 03/10/2024)     No current facility-administered medications on  file prior to visit.   Allergies  Allergen Reactions  . Ibuprofen Hives  . Labetalol  Other (See Comments)    fatigue  labetalol   . Nsaids     ELEVATED LFT'S   Social History   Socioeconomic History  . Marital status: Single    Spouse name: Not on file  . Number of children: 1  . Years of education: Not on file  . Highest education level: Not on file  Occupational History  . Occupation: Full time    Comment: Engineer, petroleum  Tobacco Use  . Smoking status: Former    Current packs/day: 0.00    Average packs/day: 0.3 packs/day for 20.0 years (5.0 ttl pk-yrs)    Types: Cigarettes    Start date: 09/07/1992    Quit date: 09/07/2012    Years since quitting: 11.5  . Smokeless tobacco: Current    Types: Snuff  Vaping Use  . Vaping status: Never Used  Substance and Sexual Activity  . Alcohol use: Not Currently    Comment: histoorically a heavy drinker (beer only), none for a couple months   . Drug use: No  . Sexual activity: Not Currently  Other Topics Concern  . Not on file  Social History Narrative  . Not on file   Social Drivers of Health   Financial Resource Strain: Low Risk  (11/21/2023)   Received from [facility]   Overall Financial Resource Strain (CARDIA)   . Difficulty of Paying Living  Expenses: Not hard at all  Food Insecurity: No Food Insecurity (11/21/2023)   Received from [facility]   Hunger Vital Sign   . Within the past 12 months, you worried that your food would run out before you got the money to buy more.: Never true   . Within the past 12 months, the food you bought just didn't last and you didn't have money to get more.: Never true  Transportation Needs: No Transportation Needs (11/22/2023)   Received from [facility]   [facility] - Transportation   . In the past 12 months, has lack of transportation kept you from medical appointments or from getting medications?: No   . Lack of Transportation (Non-Medical): No  Physical Activity: Inactive (10/09/2023)   Exercise Vital Sign   . Days of Exercise per Week: 0 days   . Minutes of Exercise per Session: 0 min  Stress: No Stress Concern Present (10/09/2023)   [employer] of Occupational Health - Occupational Stress Questionnaire   . Feeling of Stress : Not at all  Social Connections: Moderately Isolated (10/09/2023)   Social Connection and Isolation Panel   . Frequency of Communication with Friends and Family: Three times a week   . Frequency of Social Gatherings with Friends and Family: Three times a week   . Attends Religious Services: 1 to 4 times per year   . Active Member of Clubs or Organizations: No   . Attends Banker Meetings: Never   . Marital Status: Never married  Intimate Partner Violence: Not At Risk (10/09/2023)   Humiliation, Afraid, Rape, and Kick questionnaire   . Fear of Current or Ex-Partner: No   . Emotionally Abused: No   . Physically Abused: No   . Sexually Abused: No   Family History  Problem Relation Age of Onset  . Diabetes Mother   . Colon cancer Neg Hx   . Stomach cancer Neg Hx   . Esophageal cancer Neg Hx  Review of Systems  All other systems reviewed and are negative.      Objective:   Physical Exam Vitals  reviewed.  Constitutional:      General: He is not in acute distress.    Appearance: Normal appearance. He is normal weight. He is not ill-appearing or toxic-appearing.  Neck:     Vascular: No carotid bruit.  Cardiovascular:     Rate and Rhythm: Regular rhythm. Tachycardia present.     Heart sounds: Normal heart sounds. No murmur heard.    No gallop.  Pulmonary:     Effort: No respiratory distress.     Breath sounds: Normal breath sounds. No wheezing, rhonchi or rales.  Abdominal:     General: Abdomen is flat. Bowel sounds are normal.     Palpations: Abdomen is soft.  Musculoskeletal:       Arms:     Cervical back: Neck supple.     Right lower leg: No edema.     Left lower leg: No edema.  Lymphadenopathy:     Cervical: No cervical adenopathy.  Skin:    Findings: Lesion present.  Neurological:     General: No focal deficit present.     Mental Status: He is alert and oriented to person, place, and time. Mental status is at baseline.     Cranial Nerves: No cranial nerve deficit.     Motor: No weakness.   2 cm nodular lesion on dorsum of right forearm.  Suspect basal cell versus squamous cell carcinoma        Assessment & Plan:  Skin lesion of right arm - Plan: Pathology Report ([facility])  Flu vaccine need - Plan: Flu vaccine trivalent PF, 6mos and older(Flulaval,Afluria,Fluarix,Fluzone)  Type 2 diabetes mellitus with other specified complication, without long-term current use of insulin  (HCC)  Chronic systolic congestive heart failure (HCC)  Heart transplant recipient [facility])  Immunocompromised  CKD stage 3a, GFR 45-59 ml/min (HCC)   the patient's most recent creatinine on September 17 at [facility] was 1.7 with a GFR of 48.  This equates to stage IIIa chronic kidney disease.  His blood pressure today is significantly elevated.  He was recently started on labetalol  due to however he could not tolerate the medication because his blood pressure dropped too low.  He was then  given amlodipine which he stopped on his own due to peripheral edema.  I will try the patient on Bystolic 2 mg daily and recheck blood pressure in 1 week.  In the long run, I would be interested in starting the patient on an angiotensin receptor blocker for renal protection however he has upcoming elective orthopedic surgery but I do not want to do anything that may compromise his surgery and exacerbate his renal function.  Patient received his flu shot today.  He also received the shingles vaccine.  The remainder of his preventative care is up-to-date.  I encouraged him to check with [facility] to see when he is next due for the pneumonia vaccine as I believe that he has likely had that there.  I anesthetized the skin along his right forearm 0.1% lidocaine  with epinephrine .  I then performed a shave biopsy of the entire lesion down to the subcutaneous fat.  Hemostasis was achieved with Drysol and a pressure dressing.  The lesion was sent to pathology in a labeled container.  We will await the results of the biopsy to determine if he needs wider excision.

## 2024-03-11 DIAGNOSIS — Z941 Heart transplant status: Secondary | ICD-10-CM | POA: Diagnosis not present

## 2024-03-11 DIAGNOSIS — Z48298 Encounter for aftercare following other organ transplant: Secondary | ICD-10-CM | POA: Diagnosis not present

## 2024-03-11 NOTE — Progress Notes (Addendum)
 COVID Vaccine Completed: yes  Date of COVID positive in last 90 days:  PCP - Butler Burr, MD Cardiologist - Toribio Fuel, MD Electrophysiologist- Danelle Birmingham, MD Transplant- Juliene Bile, MD- main doc  Medical clearance in media tab dated 01/30/24  Chest x-ray - 06/14/23 Epic EKG - 06/17/23 Epic Stress Test - 12/31/22 Epic ECHO - 02/13/24 [facility] Cardiac Cath - 06/20/23 [facility] Pacemaker/ICD device last checked: 11/08/22- has has heart transplant now Spinal Cord Stimulator:N/A  Bowel Prep - N/A  Sleep Study - N/A CPAP -   Fasting Blood Sugar - preDM, no meds Checks Blood Sugar checks a few times a week 100  Last dose of GLP1 agonist-  N/A GLP1 instructions:  Do not take after     Last dose of SGLT-2 inhibitors-  N/A SGLT-2 instructions:  Do not take after     Blood Thinner Instructions: N/A Last dose:   Time: Aspirin  Instructions: ASA 81, no instructions per pt Last Dose:  Activity level: Can go up a flight of stairs and perform activities of daily living without stopping and without symptoms of chest pain or shortness of breath. Ambulates with cane.   Anesthesia review: cardiomyopathy, CHF, cirrhosis, DM, AICD, heart transplant, creatinine 1.99  Patient denies shortness of breath, fever, cough and chest pain at PAT appointment  Patient verbalized understanding of instructions that were given to them at the PAT appointment. Patient was also instructed that they will need to review over the PAT instructions again at home before surgery.

## 2024-03-11 NOTE — Patient Instructions (Addendum)
 SURGICAL WAITING ROOM VISITATION  Patients having surgery or a procedure may have no more than 2 support people in the waiting area - these visitors may rotate.    Children under the age of 27 must have an adult with them who is not the patient.  Visitors with respiratory illnesses are discouraged from visiting and should remain at home.  If the patient needs to stay at the hospital during part of their recovery, the visitor guidelines for inpatient rooms apply. Pre-op nurse will coordinate an appropriate time for 1 support person to accompany patient in pre-op.  This support person may not rotate.    Please refer to the [facility] website for the visitor guidelines for Inpatients (after your surgery is over and you are in a regular room).    Your procedure is scheduled on: 03/25/24   Report to [facility] Main Entrance    Report to admitting at 9:00 AM   Call this number if you have problems the morning of surgery [phone]   Do not eat food :After Midnight.   After Midnight you may have the following liquids until 8:30 AM DAY OF SURGERY  Water  Non-Citrus Juices (without pulp, NO RED-Apple, White grape, White cranberry) Black Coffee (NO MILK/CREAM OR CREAMERS, sugar ok)  Clear Tea (NO MILK/CREAM OR CREAMERS, sugar ok) regular and decaf                             Plain Jell-O (NO RED)                                           Fruit ices (not with fruit pulp, NO RED)                                     Popsicles (NO RED)                                                               Sports drinks like Gatorade (NO RED)                 The day of surgery:  Drink ONE (1) Pre-Surgery G2 at 8:30 AM the morning of surgery. Drink in one sitting. Do not sip.  This drink was given to you during your hospital  pre-op appointment visit. Nothing else to drink after completing the  Pre-Surgery G2.          If you have questions, please contact your surgeon's  office.   FOLLOW BOWEL PREP AND ANY ADDITIONAL PRE OP INSTRUCTIONS YOU RECEIVED FROM YOUR SURGEON'S OFFICE!!!     Oral Hygiene is also important to reduce your risk of infection.                                    Remember - BRUSH YOUR TEETH THE MORNING OF SURGERY WITH YOUR REGULAR TOOTHPASTE  DENTURES WILL BE REMOVED PRIOR TO SURGERY PLEASE DO NOT APPLY Poly grip OR ADHESIVES!!!  Stop all vitamins and herbal supplements 7 days before surgery.   Take these medicines the morning of surgery with A SIP OF WATER : Azithromycin , Zyrtec , Cellcept (Mycophenolate), Bystolic, Oxycodone , Pantoprazole , Prednisone , Prograf  (Tacrolmus)                              You may not have any metal on your body including jewelry, and body piercing             Do not wear lotions, powders, cologne, or deodorant              Men may shave face and neck.   Do not bring valuables to the hospital. [facility] IS NOT             RESPONSIBLE   FOR VALUABLES.   Contacts, glasses, dentures or bridgework may not be worn into surgery.   Bring small overnight bag day of surgery.   DO NOT BRING YOUR HOME MEDICATIONS TO THE HOSPITAL. PHARMACY WILL DISPENSE MEDICATIONS LISTED ON YOUR MEDICATION LIST TO YOU DURING YOUR ADMISSION IN THE HOSPITAL!              Please read over the following fact sheets you were given: IF YOU HAVE QUESTIONS ABOUT YOUR PRE-OP INSTRUCTIONS PLEASE CALL [phone]   If you received a COVID test during your pre-op visit  it is requested that you wear a mask when out in public, stay away from anyone that may not be feeling well and notify your surgeon if you develop symptoms. If you test positive for Covid or have been in contact with anyone that has tested positive in the last 10 days please notify you surgeon.      Pre-operative 4 CHG Bath Instructions   You can play a key role in reducing the risk of infection after surgery. Your skin needs to be as free of germs as  possible. You can reduce the number of germs on your skin by washing with CHG (chlorhexidine  gluconate) soap before surgery. CHG is an antiseptic soap that kills germs and continues to kill germs even after washing.   DO NOT use if you have an allergy to chlorhexidine /CHG or antibacterial soaps. If your skin becomes reddened or irritated, stop using the CHG and notify one of our RNs at   Please shower with the CHG soap starting 4 days before surgery using the following schedule:     Please keep in mind the following:  DO NOT shave, including legs and underarms, starting the day of your first shower.   You may shave your face at any point before/day of surgery.  Place clean sheets on your bed the day you start using CHG soap. Use a clean washcloth (not used since being washed) for each shower. DO NOT sleep with pets once you start using the CHG.  CHG Shower Instructions:  If you choose to wash your hair and private area, wash first with your normal shampoo/soap.  After you use shampoo/soap, rinse your hair and body thoroughly to remove shampoo/soap residue.  Turn the water  OFF and apply about 3 tablespoons (45 ml) of CHG soap to a CLEAN washcloth.  Apply CHG soap ONLY FROM YOUR NECK DOWN TO YOUR TOES (washing for 3-5 minutes)  DO NOT use CHG soap on face, private areas, open wounds, or sores.  Pay special attention to the area where your surgery is being performed.  If you are  having back surgery, having someone wash your back for you may be helpful. Wait 2 minutes after CHG soap is applied, then you may rinse off the CHG soap.  Pat dry with a clean towel  Put on clean clothes/pajamas   If you choose to wear lotion, please use ONLY the CHG-compatible lotions on the back of this paper.     Additional instructions for the day of surgery: DO NOT APPLY any lotions, deodorants, cologne, or perfumes.   Put on clean/comfortable clothes.  Brush your teeth.  Ask your nurse before applying any  prescription medications to the skin.   CHG Compatible Lotions   Aveeno Moisturizing lotion  Cetaphil Moisturizing Cream  Cetaphil Moisturizing Lotion  Clairol Herbal Essence Moisturizing Lotion, Dry Skin  Clairol Herbal Essence Moisturizing Lotion, Extra Dry Skin  Clairol Herbal Essence Moisturizing Lotion, Normal Skin  Curel Age Defying Therapeutic Moisturizing Lotion with Alpha Hydroxy  Curel Extreme Care Body Lotion  Curel Soothing Hands Moisturizing Hand Lotion  Curel Therapeutic Moisturizing Cream, Fragrance-Free  Curel Therapeutic Moisturizing Lotion, Fragrance-Free  Curel Therapeutic Moisturizing Lotion, Original Formula  Eucerin Daily Replenishing Lotion  Eucerin Dry Skin Therapy Plus Alpha Hydroxy Crme  Eucerin Dry Skin Therapy Plus Alpha Hydroxy Lotion  Eucerin Original Crme  Eucerin Original Lotion  Eucerin Plus Crme Eucerin Plus Lotion  Eucerin TriLipid Replenishing Lotion  Keri Anti-Bacterial Hand Lotion  Keri Deep Conditioning Original Lotion Dry Skin Formula Softly Scented  Keri Deep Conditioning Original Lotion, Fragrance Free Sensitive Skin Formula  Keri Lotion Fast Absorbing Fragrance Free Sensitive Skin Formula  Keri Lotion Fast Absorbing Softly Scented Dry Skin Formula  Keri Original Lotion  Keri Skin Renewal Lotion Keri Silky Smooth Lotion  Keri Silky Smooth Sensitive Skin Lotion  Nivea Body Creamy Conditioning Oil  Nivea Body Extra Enriched Teacher, adult education Moisturizing Lotion Nivea Crme  Nivea Skin Firming Lotion  NutraDerm 30 Skin Lotion  NutraDerm Skin Lotion  NutraDerm Therapeutic Skin Cream  NutraDerm Therapeutic Skin Lotion  ProShield Protective Hand Cream  Provon moisturizing lotion

## 2024-03-12 ENCOUNTER — Other Ambulatory Visit: Payer: Self-pay

## 2024-03-12 ENCOUNTER — Encounter (HOSPITAL_COMMUNITY)
Admission: RE | Admit: 2024-03-12 | Discharge: 2024-03-12 | Disposition: A | Discharge status: Home / Self Care | Source: Ambulatory Visit | Attending: Orthopedic Surgery | Admitting: Orthopedic Surgery

## 2024-03-12 ENCOUNTER — Encounter (HOSPITAL_COMMUNITY): Payer: Self-pay

## 2024-03-12 VITALS — BP 158/103 | HR 99 | Temp 98.4°F | Resp 16 | Ht 68.0 in | Wt 145.0 lb

## 2024-03-12 DIAGNOSIS — E1169 Type 2 diabetes mellitus with other specified complication: Secondary | ICD-10-CM | POA: Insufficient documentation

## 2024-03-12 DIAGNOSIS — Z01812 Encounter for preprocedural laboratory examination: Secondary | ICD-10-CM | POA: Insufficient documentation

## 2024-03-12 DIAGNOSIS — Z01818 Encounter for other preprocedural examination: Secondary | ICD-10-CM

## 2024-03-12 HISTORY — DX: Essential (primary) hypertension: I10

## 2024-03-12 LAB — PATHOLOGY REPORT

## 2024-03-12 LAB — COMPREHENSIVE METABOLIC PANEL WITH GFR
ALT: 48 U/L — ABNORMAL HIGH (ref 0–44)
AST: 70 U/L — ABNORMAL HIGH (ref 15–41)
Albumin: 4.1 g/dL (ref 3.5–5.0)
Alkaline Phosphatase: 389 U/L — ABNORMAL HIGH (ref 38–126)
Anion gap: 11 (ref 5–15)
BUN: 26 mg/dL — ABNORMAL HIGH (ref 6–20)
CO2: 26 mmol/L (ref 22–32)
Calcium: 9.5 mg/dL (ref 8.9–10.3)
Chloride: 103 mmol/L (ref 98–111)
Creatinine, Ser: 1.99 mg/dL — ABNORMAL HIGH (ref 0.61–1.24)
GFR, Estimated: 40 mL/min — ABNORMAL LOW (ref >60)
Glucose, Bld: 133 mg/dL — ABNORMAL HIGH (ref 70–99)
Potassium: 4.4 mmol/L (ref 3.5–5.1)
Sodium: 140 mmol/L (ref 135–145)
Total Bilirubin: 0.3 mg/dL (ref 0.0–1.2)
Total Protein: 7.1 g/dL (ref 6.5–8.1)

## 2024-03-12 LAB — TISSUE SPECIMEN

## 2024-03-12 LAB — CBC
HCT: 43 % (ref 39.0–52.0)
Hemoglobin: 13.4 g/dL (ref 13.0–17.0)
MCH: 30.3 pg (ref 26.0–34.0)
MCHC: 31.2 g/dL (ref 30.0–36.0)
MCV: 97.3 fL (ref 80.0–100.0)
Platelets: 274 K/uL (ref 150–400)
RBC: 4.42 MIL/uL (ref 4.22–5.81)
RDW: 13.4 % (ref 11.5–15.5)
WBC: 4.7 K/uL (ref 4.0–10.5)
nRBC: 0 % (ref 0.0–0.2)

## 2024-03-12 LAB — TYPE AND SCREEN
ABO/RH(D): A POS
Antibody Screen: NEGATIVE

## 2024-03-12 LAB — SURGICAL PCR SCREEN
MRSA, PCR: NEGATIVE
Staphylococcus aureus: NEGATIVE

## 2024-03-13 ENCOUNTER — Ambulatory Visit: Payer: Self-pay | Admitting: Family Medicine

## 2024-03-17 ENCOUNTER — Ambulatory Visit: Payer: Self-pay | Admitting: Student

## 2024-03-17 NOTE — H&P (View-Only) (Signed)
 TOTAL HIP ADMISSION H&P  Patient is admitted for right total hip arthroplasty.  Subjective:  Chief Complaint: right hip pain  HPI: [name], 53 y.o. male, has a history of pain and functional disability in the right hip(s) due to arthritis and patient has failed non-surgical conservative treatments for greater than 12 weeks to include NSAID's and/or analgesics, flexibility and strengthening excercises, use of assistive devices, and activity modification.  Onset of symptoms was gradual starting 10 years ago with rapidlly worsening course since that time.The patient noted no past surgery on the right hip(s).  Patient currently rates pain in the right hip at 10 out of 10 with activity. Patient has night pain, worsening of pain with activity and weight bearing, trendelenberg gait, pain that interfers with activities of daily living, and pain with passive range of motion. Patient has evidence of subchondral cysts, subchondral sclerosis, periarticular osteophytes, and joint space narrowing by imaging studies. This condition presents safety issues increasing the risk of falls.  There is no current active infection.  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   Pneumococcal arthritis of right hip (HCC) 10/16/2023   Osteoarthritis of right hip 07/22/2023   Pain of right hip joint 07/22/2023   Fracture of right hip (HCC) 06/27/2023   Nocardial pneumonia 06/24/2023   Anemia of chronic disease 06/19/2023   Rhinovirus 06/17/2023   RML pneumonia 06/15/2023   Blurred vision 05/16/2023   CKD (chronic kidney disease) 05/16/2023   Hypomagnesemia 05/16/2023   Pericardial effusion without cardiac tamponade 05/16/2023   Immunocompromised patient 02/11/2023   Thrombocytopenia 02/10/2023   Heart transplanted (HCC) 02/06/2023    Solitary kidney, acquired 02/01/2023   Acute on chronic systolic (congestive) heart failure (HCC) 01/15/2023   Positive colorectal cancer screening using Cologuard test 10/31/2022   Benign neoplasm of colon 10/31/2022   Mucosal abnormality of colon 10/31/2022   Adrenal mass 1 cm to 4 cm in diameter 08/21/2022   Heart transplant candidate 05/23/2022   Pulmonary nodule 05/22/2022   Acute on chronic systolic congestive heart failure (HCC)    Acute on chronic combined systolic and diastolic CHF (congestive heart failure) (HCC) 09/03/2021   GERD (gastroesophageal reflux disease) 09/03/2021   Cough 09/03/2021   Hyponatremia 09/03/2021   Hypokalemia 09/03/2021   DM (diabetes mellitus) (HCC) 08/12/2019   Tobacco abuse    Alcohol abuse 10/19/2015   Cirrhosis of liver (HCC) 09/21/2015   Erectile dysfunction 03/18/2015   Chronic pain syndrome 09/15/2014   Insomnia 09/07/2013   Chronic CHF (congestive heart failure) (HCC) 08/25/2013   Paresthesia of hand 11/12/2012   Hyperlipidemia 08/12/2012   Scoliosis 01/01/2012   H/O compression fracture of spine 01/01/2012   Back pain 10/22/2011   Neck pain 10/22/2011   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/30/2011   Depression 07/25/2011   Laboratory test 06/20/2011   Wilms' tumor [facility])    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   Past Medical History:  Diagnosis Date   AICD (automatic cardioverter/defibrillator) present    Arthritis    Cardiomyopathy, nonischemic (HCC)    takes Digoxin  daily and Carvedilol  daily   CHF (congestive heart failure) (HCC)    takes Lasix  daily as well Aldactone    Chronic back pain    Chronic systolic heart failure (HCC)    Cirrhosis (HCC)    Depression  takes Prozac  daily   Diabetes mellitus without complication (HCC)    preDM   GERD (gastroesophageal reflux disease)    takes Omeprazole  daily   Hx of Alcohol consumption heavy    rare beer currently   hx of Tobacco abuse     quit smoking 2014   Hyperlipidemia    was on Simvastatin  but has been off a year   Hypertension    Insomnia    takes Ambien  as needed(just got script yesterday)   Orthostatic hypotension    Scoliosis    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 29 (XRT and chemo)    Past Surgical History:  Procedure Laterality Date   BIOPSY  10/31/2022   Procedure: BIOPSY;  Surgeon: Leigh Elspeth SQUIBB, MD;  Location: [facility] ENDOSCOPY;  Service: Gastroenterology;;   ORIN MEDIATE RELEASE Right 01/09/2013   Procedure: CARPAL TUNNEL RELEASE;  Surgeon: Rockey LITTIE Peru, MD;  Location: [facility] NEURO ORS;  Service: Neurosurgery;  Laterality: Right;  RIGHT carpal tunnel release   CARPAL TUNNEL RELEASE Left 01/30/2013   Procedure: LEFT CARPAL TUNNEL RELEASE;  Surgeon: Rockey LITTIE Peru, MD;  Location: [facility] NEURO ORS;  Service: Neurosurgery;  Laterality: Left;  LEFT Carpal Tunnel release   CHEST TUBE INSERTION Right 09/09/2013   Procedure: INSERTION PLEURAL DRAINAGE CATHETER;  Surgeon: Maude Fleeta Ochoa, MD;  Location: [facility] OR;  Service: Thoracic;  Laterality: Right;   COLONOSCOPY WITH PROPOFOL  N/A 10/31/2022   Procedure: COLONOSCOPY WITH PROPOFOL ;  Surgeon: Leigh Elspeth SQUIBB, MD;  Location: [facility] ENDOSCOPY;  Service: Gastroenterology;  Laterality: N/A;   HYDROCELE EXCISION Right 04/11/2017   Procedure: RIGHT HYDROCELECTOMY ADULT;  Surgeon: Watt Rush, MD;  Location: [facility] ORS;  Service: Urology;  Laterality: Right;   hydrocelectomy  2008   ICD  05/25/2011   [facility] [employer] SG lead/[employer]nergen single chamber device   IMPLANTABLE CARDIOVERTER DEFIBRILLATOR IMPLANT N/A 05/25/2011   Procedure: IMPLANTABLE CARDIOVERTER DEFIBRILLATOR IMPLANT;  Surgeon: Danelle LELON Birmingham, MD;  Location: [facility] CATH LAB;  Service: Cardiovascular;  Laterality: N/A;   IR FLUORO GUIDE CV LINE RIGHT  01/16/2023   IR US  GUIDE VASC ACCESS RIGHT  01/16/2023   NEPHRECTOMY  36 yrs ago   Wilms Tumor   POLYPECTOMY  10/31/2022   Procedure: POLYPECTOMY;  Surgeon: Leigh Elspeth SQUIBB, MD;  Location: [facility] ENDOSCOPY;  Service: Gastroenterology;;   RIGHT HEART CATH N/A 01/29/2019   Procedure: RIGHT HEART CATH;  Surgeon: Cherrie Toribio SAUNDERS, MD;  Location: [facility] INVASIVE CV LAB;  Service: Cardiovascular;  Laterality: N/A;   RIGHT HEART CATH N/A 09/08/2019   Procedure: RIGHT HEART CATH;  Surgeon: Cherrie Toribio SAUNDERS, MD;  Location: [facility] INVASIVE CV LAB;  Service: Cardiovascular;  Laterality: N/A;   RIGHT HEART CATH N/A 01/15/2023   Procedure: RIGHT HEART CATH;  Surgeon: Cherrie Toribio SAUNDERS, MD;  Location: [facility] INVASIVE CV LAB;  Service: Cardiovascular;  Laterality: N/A;   RIGHT/LEFT HEART CATH AND CORONARY ANGIOGRAPHY N/A 02/12/2017   Procedure: RIGHT/LEFT HEART CATH AND CORONARY ANGIOGRAPHY;  Surgeon: Cherrie Toribio SAUNDERS, MD;  Location: [facility] INVASIVE CV LAB;  Service: Cardiovascular;  Laterality: N/A;   ULNAR NERVE TRANSPOSITION Right 01/09/2013   Procedure: ULNAR NERVE DECOMPRESSION/TRANSPOSITION;  Surgeon: Rockey LITTIE Peru, MD;  Location: [facility] NEURO ORS;  Service: Neurosurgery;  Laterality: Right;  RIGHT ulnar nerve decompression    Current Outpatient Medications  Medication Sig Dispense Refill Last Dose/Taking   aspirin  EC 81 MG tablet Take 81 mg by mouth in the morning.  atovaquone (MEPRON) 750 MG/5ML suspension Take 750 mg by mouth daily.      azithromycin  (ZITHROMAX ) 500 MG tablet Take 500 mg by mouth in the morning.      Blood Glucose Monitoring Suppl DEVI 1 each by Does not apply route in the morning, at noon, and at bedtime. May substitute to any manufacturer covered by patient's insurance. 1 each 0    cetirizine  (ZYRTEC ) 10 MG tablet Take 1 tablet (10 mg total) by mouth daily. 90 tablet 3    colchicine  0.6 MG tablet Take 2 at first sign of attack And repeat 1 more in 1 hour (Patient taking differently: Take 0.6-1.2 mg by mouth daily as needed (gout attack.). Take 2 at first sign of attack And repeat 1 more in 1 hour) 30 tablet 1    fluticasone  (FLONASE ) 50 MCG/ACT nasal spray  Place 2 sprays into both nostrils daily. (Patient taking differently: Place 2 sprays into both nostrils daily as needed for allergies.) 16 g 6    MAGNESIUM -OXIDE 400 (240 Mg) MG tablet Take 400 mg by mouth in the morning.      mycophenolate (CELLCEPT) 250 MG capsule Take 250 mg by mouth 2 (two) times daily.      nebivolol (BYSTOLIC) 10 MG tablet Take 1 tablet (10 mg total) by mouth daily. 90 tablet 3    oxyCODONE  (OXY IR/ROXICODONE ) 5 MG immediate release tablet Take 1 tablet (5 mg total) by mouth 3 (three) times daily as needed for severe pain (pain score 7-10). (Patient taking differently: Take 5 mg by mouth 2 (two) times daily as needed for severe pain (pain score 7-10).) 60 tablet 0    pantoprazole  (PROTONIX ) 40 MG tablet Take 40 mg by mouth daily before breakfast.      predniSONE  (DELTASONE ) 2.5 MG tablet Take 2.5 mg by mouth daily with breakfast.      rosuvastatin (CRESTOR) 5 MG tablet Take 5 mg by mouth every evening.      tacrolimus  (PROGRAF ) 1 MG capsule Take 3 mg by mouth 2 (two) times daily.      No current facility-administered medications for this visit.   Allergies  Allergen Reactions   Ibuprofen Hives   Labetalol  Other (See Comments)    fatigue  labetalol    Nsaids     ELEVATED LFT'S    Social History   Tobacco Use   Smoking status: Former    Current packs/day: 0.00    Average packs/day: 0.3 packs/day for 20.0 years (5.0 ttl pk-yrs)    Types: Cigarettes    Start date: 09/07/1992    Quit date: 09/07/2012    Years since quitting: 11.5   Smokeless tobacco: Former    Types: Snuff    Quit date: 02/2022  Substance Use Topics   Alcohol use: Not Currently    Comment: histoorically a heavy drinker (beer only), none for a couple months     Family History  Problem Relation Age of Onset   Diabetes Mother    Colon cancer Neg Hx    Stomach cancer Neg Hx    Esophageal cancer Neg Hx      Review of Systems  Musculoskeletal:  Positive for arthralgias, gait problem and  joint swelling.  All other systems reviewed and are negative.   Objective:  Physical Exam Constitutional:      Appearance: Normal appearance.  HENT:     Head: Normocephalic and atraumatic.     Nose: Nose normal.     Mouth/Throat:  Mouth: Mucous membranes are moist.     Pharynx: Oropharynx is clear.  Eyes:     Conjunctiva/sclera: Conjunctivae normal.  Cardiovascular:     Rate and Rhythm: Normal rate and regular rhythm.     Pulses: Normal pulses.     Heart sounds: Normal heart sounds.  Pulmonary:     Effort: Pulmonary effort is normal.     Breath sounds: Normal breath sounds.  Abdominal:     General: Abdomen is flat.     Palpations: Abdomen is soft.  Genitourinary:    Comments: Deferred. Musculoskeletal:     Cervical back: Normal range of motion and neck supple.     Comments: Examination of the right hip reveals no skin wounds or lesions. Mild trochanteric tenderness to palpation. Severely restricted range of motion of the right hip. Pain with flexion and rotation. Pain in the position of impingement. Positive Stinchfield.  Neurovascular intact distally.  Ambulates with a markedly antalgic gait.  Skin:    General: Skin is warm and dry.     Capillary Refill: Capillary refill takes less than 2 seconds.  Neurological:     General: No focal deficit present.     Mental Status: He is alert and oriented to person, place, and time.  Psychiatric:        Mood and Affect: Mood normal.        Behavior: Behavior normal.        Thought Content: Thought content normal.        Judgment: Judgment normal.     Vital signs in last 24 hours: @VSRANGES @  Labs:   Estimated body mass index is 22.05 kg/m as calculated from the following:   Height as of 03/12/24: 5' 8 (1.727 m).   Weight as of 03/12/24: 65.8 kg.   Imaging Review Plain radiographs demonstrate severe degenerative joint disease of the right hip(s). The bone quality appears to be adequate for age and reported  activity level.      Assessment/Plan:  End stage arthritis, right hip(s)  The patient history, physical examination, clinical judgement of the provider and imaging studies are consistent with end stage degenerative joint disease of the right hip(s) and total hip arthroplasty is deemed medically necessary. The treatment options including medical management, injection therapy, arthroscopy and arthroplasty were discussed at length. The risks and benefits of total hip arthroplasty were presented and reviewed. The risks due to aseptic loosening, infection, stiffness, dislocation/subluxation,  thromboembolic complications and other imponderables were discussed.  The patient acknowledged the explanation, agreed to proceed with the plan and consent was signed. Patient is being admitted for inpatient treatment for surgery, pain control, PT, OT, prophylactic antibiotics, VTE prophylaxis, progressive ambulation and ADL's and discharge planning.The patient is planning to be discharged home with HEP after an overnight stay.    Therapy Plans: HEP.  Disposition: Home with son. Planned DVT Prophylaxis: aspirin  81mg  BID DME needed: Has rolling walker and cane.  PCP: Cleared, history of heart transplant requesting clearance from transplant team, recent CMV admission, recommend ID clearance.  Cardiology: Cleared. Recommend 40mg  stress dose steroids POD, then 20mg  dose POD#1, then back to baseline prednisone . Would like patient on 2.5mg  prednisone  prior to surgery.  ID: Cleared. On azithromycin , stopped valcyte 03/12/24.  TXA: IV Allergies:  - ibuprofen - hives. - Tolmetin - elevated LFTs.  Anesthesia Concerns: None.  BMI: 23.1 Last HgbA1c: 6.4.  Other: - Heart transplant history, tacrolimus , prednisone  on 2.5mg  daily, mycophenolate. - Aspirin  81mg  baseline.  - CKD history,  Wilms tumor, nephrectomy as child.  - Lung.  - T2DM, metformin. Insulin  with steroids.  - Oxycodone  baseline from PCP, 5mg  1-2  daily.  - Oxycodone , zofran , methocarbamol. No NSAIDs. - 03/12/24: Hgb 13.4, K+ 4.4, Cr. 1.99.     Patient's anticipated LOS is less than 2 midnights, meeting these requirements: - Younger than 33 - Lives within 1 hour of care - Has a competent adult at home to recover with post-op recover - NO history of  - Chronic pain requiring opiods  - Diabetes  - Coronary Artery Disease  - Heart failure  - Heart attack  - Stroke  - DVT/VTE  - Cardiac arrhythmia  - Respiratory Failure/COPD  - Renal failure  - Anemia  - Advanced Liver disease

## 2024-03-17 NOTE — H&P (Signed)
 TOTAL HIP ADMISSION H&P  Patient is admitted for right total hip arthroplasty.  Subjective:  Chief Complaint: right hip pain  HPI: [name], 53 y.o. male, has a history of pain and functional disability in the right hip(s) due to arthritis and patient has failed non-surgical conservative treatments for greater than 12 weeks to include NSAID's and/or analgesics, flexibility and strengthening excercises, use of assistive devices, and activity modification.  Onset of symptoms was gradual starting 10 years ago with rapidlly worsening course since that time.The patient noted no past surgery on the right hip(s).  Patient currently rates pain in the right hip at 10 out of 10 with activity. Patient has night pain, worsening of pain with activity and weight bearing, trendelenberg gait, pain that interfers with activities of daily living, and pain with passive range of motion. Patient has evidence of subchondral cysts, subchondral sclerosis, periarticular osteophytes, and joint space narrowing by imaging studies. This condition presents safety issues increasing the risk of falls.  There is no current active infection.  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   Pneumococcal arthritis of right hip (HCC) 10/16/2023   Osteoarthritis of right hip 07/22/2023   Pain of right hip joint 07/22/2023   Fracture of right hip (HCC) 06/27/2023   Nocardial pneumonia 06/24/2023   Anemia of chronic disease 06/19/2023   Rhinovirus 06/17/2023   RML pneumonia 06/15/2023   Blurred vision 05/16/2023   CKD (chronic kidney disease) 05/16/2023   Hypomagnesemia 05/16/2023   Pericardial effusion without cardiac tamponade 05/16/2023   Immunocompromised patient 02/11/2023   Thrombocytopenia 02/10/2023   Heart transplanted (HCC) 02/06/2023    Solitary kidney, acquired 02/01/2023   Acute on chronic systolic (congestive) heart failure (HCC) 01/15/2023   Positive colorectal cancer screening using Cologuard test 10/31/2022   Benign neoplasm of colon 10/31/2022   Mucosal abnormality of colon 10/31/2022   Adrenal mass 1 cm to 4 cm in diameter 08/21/2022   Heart transplant candidate 05/23/2022   Pulmonary nodule 05/22/2022   Acute on chronic systolic congestive heart failure (HCC)    Acute on chronic combined systolic and diastolic CHF (congestive heart failure) (HCC) 09/03/2021   GERD (gastroesophageal reflux disease) 09/03/2021   Cough 09/03/2021   Hyponatremia 09/03/2021   Hypokalemia 09/03/2021   DM (diabetes mellitus) (HCC) 08/12/2019   Tobacco abuse    Alcohol abuse 10/19/2015   Cirrhosis of liver (HCC) 09/21/2015   Erectile dysfunction 03/18/2015   Chronic pain syndrome 09/15/2014   Insomnia 09/07/2013   Chronic CHF (congestive heart failure) (HCC) 08/25/2013   Paresthesia of hand 11/12/2012   Hyperlipidemia 08/12/2012   Scoliosis 01/01/2012   H/O compression fracture of spine 01/01/2012   Back pain 10/22/2011   Neck pain 10/22/2011   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/30/2011   Depression 07/25/2011   Laboratory test 06/20/2011   Wilms' tumor [facility])    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   Past Medical History:  Diagnosis Date   AICD (automatic cardioverter/defibrillator) present    Arthritis    Cardiomyopathy, nonischemic (HCC)    takes Digoxin  daily and Carvedilol  daily   CHF (congestive heart failure) (HCC)    takes Lasix  daily as well Aldactone    Chronic back pain    Chronic systolic heart failure (HCC)    Cirrhosis (HCC)    Depression  takes Prozac  daily   Diabetes mellitus without complication (HCC)    preDM   GERD (gastroesophageal reflux disease)    takes Omeprazole  daily   Hx of Alcohol consumption heavy    rare beer currently   hx of Tobacco abuse     quit smoking 2014   Hyperlipidemia    was on Simvastatin  but has been off a year   Hypertension    Insomnia    takes Ambien  as needed(just got script yesterday)   Orthostatic hypotension    Scoliosis    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 29 (XRT and chemo)    Past Surgical History:  Procedure Laterality Date   BIOPSY  10/31/2022   Procedure: BIOPSY;  Surgeon: Leigh Elspeth SQUIBB, MD;  Location: [facility] ENDOSCOPY;  Service: Gastroenterology;;   ORIN MEDIATE RELEASE Right 01/09/2013   Procedure: CARPAL TUNNEL RELEASE;  Surgeon: Rockey LITTIE Peru, MD;  Location: [facility] NEURO ORS;  Service: Neurosurgery;  Laterality: Right;  RIGHT carpal tunnel release   CARPAL TUNNEL RELEASE Left 01/30/2013   Procedure: LEFT CARPAL TUNNEL RELEASE;  Surgeon: Rockey LITTIE Peru, MD;  Location: [facility] NEURO ORS;  Service: Neurosurgery;  Laterality: Left;  LEFT Carpal Tunnel release   CHEST TUBE INSERTION Right 09/09/2013   Procedure: INSERTION PLEURAL DRAINAGE CATHETER;  Surgeon: Maude Fleeta Ochoa, MD;  Location: [facility] OR;  Service: Thoracic;  Laterality: Right;   COLONOSCOPY WITH PROPOFOL  N/A 10/31/2022   Procedure: COLONOSCOPY WITH PROPOFOL ;  Surgeon: Leigh Elspeth SQUIBB, MD;  Location: [facility] ENDOSCOPY;  Service: Gastroenterology;  Laterality: N/A;   HYDROCELE EXCISION Right 04/11/2017   Procedure: RIGHT HYDROCELECTOMY ADULT;  Surgeon: Watt Rush, MD;  Location: [facility] ORS;  Service: Urology;  Laterality: Right;   hydrocelectomy  2008   ICD  05/25/2011   [facility] [employer] SG lead/[employer] single chamber device   IMPLANTABLE CARDIOVERTER DEFIBRILLATOR IMPLANT N/A 05/25/2011   Procedure: IMPLANTABLE CARDIOVERTER DEFIBRILLATOR IMPLANT;  Surgeon: Danelle LELON Birmingham, MD;  Location: [facility] CATH LAB;  Service: Cardiovascular;  Laterality: N/A;   IR FLUORO GUIDE CV LINE RIGHT  01/16/2023   IR US  GUIDE VASC ACCESS RIGHT  01/16/2023   NEPHRECTOMY  36 yrs ago   Wilms Tumor   POLYPECTOMY  10/31/2022   Procedure: POLYPECTOMY;  Surgeon: Leigh Elspeth SQUIBB, MD;  Location: [facility] ENDOSCOPY;  Service: Gastroenterology;;   RIGHT HEART CATH N/A 01/29/2019   Procedure: RIGHT HEART CATH;  Surgeon: Cherrie Toribio SAUNDERS, MD;  Location: [facility] INVASIVE CV LAB;  Service: Cardiovascular;  Laterality: N/A;   RIGHT HEART CATH N/A 09/08/2019   Procedure: RIGHT HEART CATH;  Surgeon: Cherrie Toribio SAUNDERS, MD;  Location: [facility] INVASIVE CV LAB;  Service: Cardiovascular;  Laterality: N/A;   RIGHT HEART CATH N/A 01/15/2023   Procedure: RIGHT HEART CATH;  Surgeon: Cherrie Toribio SAUNDERS, MD;  Location: [facility] INVASIVE CV LAB;  Service: Cardiovascular;  Laterality: N/A;   RIGHT/LEFT HEART CATH AND CORONARY ANGIOGRAPHY N/A 02/12/2017   Procedure: RIGHT/LEFT HEART CATH AND CORONARY ANGIOGRAPHY;  Surgeon: Cherrie Toribio SAUNDERS, MD;  Location: [facility] INVASIVE CV LAB;  Service: Cardiovascular;  Laterality: N/A;   ULNAR NERVE TRANSPOSITION Right 01/09/2013   Procedure: ULNAR NERVE DECOMPRESSION/TRANSPOSITION;  Surgeon: Rockey LITTIE Peru, MD;  Location: [facility] NEURO ORS;  Service: Neurosurgery;  Laterality: Right;  RIGHT ulnar nerve decompression    Current Outpatient Medications  Medication Sig Dispense Refill Last Dose/Taking   aspirin  EC 81 MG tablet Take 81 mg by mouth in the morning.  atovaquone (MEPRON) 750 MG/5ML suspension Take 750 mg by mouth daily.      azithromycin  (ZITHROMAX ) 500 MG tablet Take 500 mg by mouth in the morning.      Blood Glucose Monitoring Suppl DEVI 1 each by Does not apply route in the morning, at noon, and at bedtime. May substitute to any manufacturer covered by patient's insurance. 1 each 0    cetirizine  (ZYRTEC ) 10 MG tablet Take 1 tablet (10 mg total) by mouth daily. 90 tablet 3    colchicine  0.6 MG tablet Take 2 at first sign of attack And repeat 1 more in 1 hour (Patient taking differently: Take 0.6-1.2 mg by mouth daily as needed (gout attack.). Take 2 at first sign of attack And repeat 1 more in 1 hour) 30 tablet 1    fluticasone  (FLONASE ) 50 MCG/ACT nasal spray  Place 2 sprays into both nostrils daily. (Patient taking differently: Place 2 sprays into both nostrils daily as needed for allergies.) 16 g 6    MAGNESIUM -OXIDE 400 (240 Mg) MG tablet Take 400 mg by mouth in the morning.      mycophenolate (CELLCEPT) 250 MG capsule Take 250 mg by mouth 2 (two) times daily.      nebivolol (BYSTOLIC) 10 MG tablet Take 1 tablet (10 mg total) by mouth daily. 90 tablet 3    oxyCODONE  (OXY IR/ROXICODONE ) 5 MG immediate release tablet Take 1 tablet (5 mg total) by mouth 3 (three) times daily as needed for severe pain (pain score 7-10). (Patient taking differently: Take 5 mg by mouth 2 (two) times daily as needed for severe pain (pain score 7-10).) 60 tablet 0    pantoprazole  (PROTONIX ) 40 MG tablet Take 40 mg by mouth daily before breakfast.      predniSONE  (DELTASONE ) 2.5 MG tablet Take 2.5 mg by mouth daily with breakfast.      rosuvastatin (CRESTOR) 5 MG tablet Take 5 mg by mouth every evening.      tacrolimus  (PROGRAF ) 1 MG capsule Take 3 mg by mouth 2 (two) times daily.      No current facility-administered medications for this visit.   Allergies  Allergen Reactions   Ibuprofen Hives   Labetalol  Other (See Comments)    fatigue  labetalol    Nsaids     ELEVATED LFT'S    Social History   Tobacco Use   Smoking status: Former    Current packs/day: 0.00    Average packs/day: 0.3 packs/day for 20.0 years (5.0 ttl pk-yrs)    Types: Cigarettes    Start date: 09/07/1992    Quit date: 09/07/2012    Years since quitting: 11.5   Smokeless tobacco: Former    Types: Snuff    Quit date: 02/2022  Substance Use Topics   Alcohol use: Not Currently    Comment: histoorically a heavy drinker (beer only), none for a couple months     Family History  Problem Relation Age of Onset   Diabetes Mother    Colon cancer Neg Hx    Stomach cancer Neg Hx    Esophageal cancer Neg Hx      Review of Systems  Musculoskeletal:  Positive for arthralgias, gait problem and  joint swelling.  All other systems reviewed and are negative.   Objective:  Physical Exam Constitutional:      Appearance: Normal appearance.  HENT:     Head: Normocephalic and atraumatic.     Nose: Nose normal.     Mouth/Throat:  Mouth: Mucous membranes are moist.     Pharynx: Oropharynx is clear.  Eyes:     Conjunctiva/sclera: Conjunctivae normal.  Cardiovascular:     Rate and Rhythm: Normal rate and regular rhythm.     Pulses: Normal pulses.     Heart sounds: Normal heart sounds.  Pulmonary:     Effort: Pulmonary effort is normal.     Breath sounds: Normal breath sounds.  Abdominal:     General: Abdomen is flat.     Palpations: Abdomen is soft.  Genitourinary:    Comments: Deferred. Musculoskeletal:     Cervical back: Normal range of motion and neck supple.     Comments: Examination of the right hip reveals no skin wounds or lesions. Mild trochanteric tenderness to palpation. Severely restricted range of motion of the right hip. Pain with flexion and rotation. Pain in the position of impingement. Positive Stinchfield.  Neurovascular intact distally.  Ambulates with a markedly antalgic gait.  Skin:    General: Skin is warm and dry.     Capillary Refill: Capillary refill takes less than 2 seconds.  Neurological:     General: No focal deficit present.     Mental Status: He is alert and oriented to person, place, and time.  Psychiatric:        Mood and Affect: Mood normal.        Behavior: Behavior normal.        Thought Content: Thought content normal.        Judgment: Judgment normal.     Vital signs in last 24 hours: @VSRANGES @  Labs:   Estimated body mass index is 22.05 kg/m as calculated from the following:   Height as of 03/12/24: 5' 8 (1.727 m).   Weight as of 03/12/24: 65.8 kg.   Imaging Review Plain radiographs demonstrate severe degenerative joint disease of the right hip(s). The bone quality appears to be adequate for age and reported  activity level.      Assessment/Plan:  End stage arthritis, right hip(s)  The patient history, physical examination, clinical judgement of the provider and imaging studies are consistent with end stage degenerative joint disease of the right hip(s) and total hip arthroplasty is deemed medically necessary. The treatment options including medical management, injection therapy, arthroscopy and arthroplasty were discussed at length. The risks and benefits of total hip arthroplasty were presented and reviewed. The risks due to aseptic loosening, infection, stiffness, dislocation/subluxation,  thromboembolic complications and other imponderables were discussed.  The patient acknowledged the explanation, agreed to proceed with the plan and consent was signed. Patient is being admitted for inpatient treatment for surgery, pain control, PT, OT, prophylactic antibiotics, VTE prophylaxis, progressive ambulation and ADL's and discharge planning.The patient is planning to be discharged home with HEP after an overnight stay.    Therapy Plans: HEP.  Disposition: Home with son. Planned DVT Prophylaxis: aspirin  81mg  BID DME needed: Has rolling walker and cane.  PCP: Cleared, history of heart transplant requesting clearance from transplant team, recent CMV admission, recommend ID clearance.  Cardiology: Cleared. Recommend 40mg  stress dose steroids POD, then 20mg  dose POD#1, then back to baseline prednisone . Would like patient on 2.5mg  prednisone  prior to surgery.  ID: Cleared. On azithromycin , stopped valcyte 03/12/24.  TXA: IV Allergies:  - ibuprofen - hives. - Tolmetin - elevated LFTs.  Anesthesia Concerns: None.  BMI: 23.1 Last HgbA1c: 6.4.  Other: - Heart transplant history, tacrolimus , prednisone  on 2.5mg  daily, mycophenolate. - Aspirin  81mg  baseline.  - CKD history,  Wilms tumor, nephrectomy as child.  - Lung.  - T2DM, metformin. Insulin  with steroids.  - Oxycodone  baseline from PCP, 5mg  1-2  daily.  - Oxycodone , zofran , methocarbamol. No NSAIDs. - 03/12/24: Hgb 13.4, K+ 4.4, Cr. 1.99.     Patient's anticipated LOS is less than 2 midnights, meeting these requirements: - Younger than 33 - Lives within 1 hour of care - Has a competent adult at home to recover with post-op recover - NO history of  - Chronic pain requiring opiods  - Diabetes  - Coronary Artery Disease  - Heart failure  - Heart attack  - Stroke  - DVT/VTE  - Cardiac arrhythmia  - Respiratory Failure/COPD  - Renal failure  - Anemia  - Advanced Liver disease

## 2024-03-23 ENCOUNTER — Ambulatory Visit (INDEPENDENT_AMBULATORY_CARE_PROVIDER_SITE_OTHER): Admitting: Family Medicine

## 2024-03-23 VITALS — HR 91 | Ht 68.0 in | Wt 145.0 lb

## 2024-03-23 DIAGNOSIS — C4492 Squamous cell carcinoma of skin, unspecified: Secondary | ICD-10-CM | POA: Diagnosis not present

## 2024-03-23 DIAGNOSIS — C44622 Squamous cell carcinoma of skin of right upper limb, including shoulder: Secondary | ICD-10-CM | POA: Diagnosis not present

## 2024-03-23 NOTE — Addendum Note (Signed)
 Addended by: DUANNE LOWERS T on: 03/23/2024 08:29 AM   Modules accepted: Orders

## 2024-03-23 NOTE — Progress Notes (Signed)
 Subjective:    Patient ID: [name], male    DOB: 1970-11-01, 53 y.o.   MRN: [id number]  HPI 03/10/24, I biopsied a rapidly growing mass on his right arm.  It is a dome-shaped nodular lesion 2 cm in diameter with a hyperkeratotic central core.  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  He is here for wider excision.  On his forearm, it is a 1 cm round wound ulcer with raised rolled edges.  We anesthetized that area with 0.1% lidocaine  with epinephrine  and then cleaned it thoroughly with Betadine Past Medical History:  Diagnosis Date   AICD (automatic cardioverter/defibrillator) present    Arthritis    Cardiomyopathy, nonischemic (HCC)    takes Digoxin  daily and Carvedilol  daily   CHF (congestive heart failure) (HCC)    takes Lasix  daily as well Aldactone    Chronic back pain    Chronic systolic heart failure (HCC)    Cirrhosis (HCC)    Depression    takes Prozac  daily   Diabetes mellitus without complication (HCC)    preDM   GERD (gastroesophageal reflux disease)    takes Omeprazole  daily   Hx of Alcohol consumption heavy    rare beer currently   hx of Tobacco abuse    quit smoking 2014   Hyperlipidemia    was on Simvastatin  but has been off a year   Hypertension    Insomnia    takes Ambien  as needed(just got script yesterday)   Orthostatic hypotension    Scoliosis    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 64 (XRT and chemo)   Past Surgical History:  Procedure Laterality Date   BIOPSY  10/31/2022   Procedure: BIOPSY;  Surgeon: Leigh Elspeth SQUIBB, MD;  Location: [facility] ENDOSCOPY;  Service: Gastroenterology;;   ORIN MEDIATE RELEASE Right 01/09/2013   Procedure: CARPAL TUNNEL RELEASE;  Surgeon: Rockey LITTIE Peru, MD;  Location: [facility] NEURO ORS;  Service: Neurosurgery;  Laterality: Right;  RIGHT carpal tunnel release   CARPAL TUNNEL RELEASE Left 01/30/2013   Procedure: LEFT CARPAL TUNNEL RELEASE;  Surgeon: Rockey LITTIE Peru, MD;  Location: [facility];  Service: Neurosurgery;   Laterality: Left;  LEFT Carpal Tunnel release   CHEST TUBE INSERTION Right 09/09/2013   Procedure: INSERTION PLEURAL DRAINAGE CATHETER;  Surgeon: Maude Fleeta Ochoa, MD;  Location: [facility] OR;  Service: Thoracic;  Laterality: Right;   COLONOSCOPY WITH PROPOFOL  N/A 10/31/2022   Procedure: COLONOSCOPY WITH PROPOFOL ;  Surgeon: Leigh Elspeth SQUIBB, MD;  Location: [facility] ENDOSCOPY;  Service: Gastroenterology;  Laterality: N/A;   HYDROCELE EXCISION Right 04/11/2017   Procedure: RIGHT HYDROCELECTOMY ADULT;  Surgeon: Watt Rush, MD;  Location: [facility] ORS;  Service: Urology;  Laterality: Right;   hydrocelectomy  2008   ICD  05/25/2011   [facility] [employer] SG lead/Energen single chamber device   IMPLANTABLE CARDIOVERTER DEFIBRILLATOR IMPLANT N/A 05/25/2011   Procedure: IMPLANTABLE CARDIOVERTER DEFIBRILLATOR IMPLANT;  Surgeon: Danelle LELON Birmingham, MD;  Location: [facility] CATH LAB;  Service: Cardiovascular;  Laterality: N/A;   IR FLUORO GUIDE CV LINE RIGHT  01/16/2023   IR US  GUIDE VASC ACCESS RIGHT  01/16/2023   NEPHRECTOMY  36 yrs ago   Wilms Tumor   POLYPECTOMY  10/31/2022   Procedure: POLYPECTOMY;  Surgeon: Leigh Elspeth SQUIBB, MD;  [facility]: [facility] ENDOSCOPY;  Service: Gastroenterology;;   RIGHT HEART CATH N/A 01/29/2019   Procedure: RIGHT HEART CATH;  Surgeon: Cherrie Toribio SAUNDERS, MD;  [facility] INVASIVE CV LAB;  Service: Cardiovascular;  Laterality: N/A;   RIGHT HEART CATH N/A 09/08/2019   Procedure: RIGHT HEART CATH;  Surgeon: Cherrie Toribio SAUNDERS, MD;  [facility]: [facility] INVASIVE CV LAB;  Service: Cardiovascular;  Laterality: N/A;   RIGHT HEART CATH N/A 01/15/2023   Procedure: RIGHT HEART CATH;  Surgeon: Cherrie Toribio SAUNDERS, MD;  [facility] INVASIVE CV LAB;  Service: Cardiovascular;  Laterality: N/A;   RIGHT/LEFT HEART CATH AND CORONARY ANGIOGRAPHY N/A 02/12/2017   Procedure: RIGHT/LEFT HEART CATH AND CORONARY ANGIOGRAPHY;  Surgeon: Cherrie Toribio SAUNDERS, MD;  [facility] INVASIVE CV LAB;  Service: Cardiovascular;   Laterality: N/A;   ULNAR NERVE TRANSPOSITION Right 01/09/2013   Procedure: ULNAR NERVE DECOMPRESSION/TRANSPOSITION;  Surgeon: Rockey LITTIE Peru, MD;  [facility]S;  Service: Neurosurgery;  Laterality: Right;  RIGHT ulnar nerve decompression   Current Outpatient Medications on File Prior to Visit  Medication Sig Dispense Refill   aspirin  EC 81 MG tablet Take 81 mg by mouth in the morning.     atovaquone (MEPRON) 750 MG/5ML suspension Take 750 mg by mouth daily.     azithromycin  (ZITHROMAX ) 500 MG tablet Take 500 mg by mouth in the morning.     Blood Glucose Monitoring Suppl DEVI 1 each by Does not apply route in the morning, at noon, and at bedtime. May substitute to any manufacturer covered by patient's insurance. 1 each 0   cetirizine  (ZYRTEC ) 10 MG tablet Take 1 tablet (10 mg total) by mouth daily. 90 tablet 3   colchicine  0.6 MG tablet Take 2 at first sign of attack And repeat 1 more in 1 hour (Patient taking differently: Take 0.6-1.2 mg by mouth daily as needed (gout attack.). Take 2 at first sign of attack And repeat 1 more in 1 hour) 30 tablet 1   fluticasone  (FLONASE ) 50 MCG/ACT nasal spray Place 2 sprays into both nostrils daily. (Patient taking differently: Place 2 sprays into both nostrils daily as needed for allergies.) 16 g 6   MAGNESIUM -OXIDE 400 (240 Mg) MG tablet Take 400 mg by mouth in the morning.     mycophenolate (CELLCEPT) 250 MG capsule Take 250 mg by mouth 2 (two) times daily.     nebivolol (BYSTOLIC) 10 MG tablet Take 1 tablet (10 mg total) by mouth daily. 90 tablet 3   oxyCODONE  (OXY IR/ROXICODONE ) 5 MG immediate release tablet Take 1 tablet (5 mg total) by mouth 3 (three) times daily as needed for severe pain (pain score 7-10). (Patient taking differently: Take 5 mg by mouth 2 (two) times daily as needed for severe pain (pain score 7-10).) 60 tablet 0   pantoprazole  (PROTONIX ) 40 MG tablet Take 40 mg by mouth daily before breakfast.     predniSONE  (DELTASONE ) 2.5 MG  tablet Take 2.5 mg by mouth daily with breakfast.     rosuvastatin (CRESTOR) 5 MG tablet Take 5 mg by mouth every evening.     tacrolimus  (PROGRAF ) 1 MG capsule Take 3 mg by mouth 2 (two) times daily.     No current facility-administered medications on file prior to visit.   Allergies  Allergen Reactions   Ibuprofen Hives   Labetalol  Other (See Comments)    fatigue  labetalol    Nsaids     ELEVATED LFT'S   Social History   Socioeconomic History   Marital status: Single    Spouse name: Not on file   Number of children: 1   Years of education: Not on file   Highest education level: Not on file  Occupational  History   Occupation: Full time    Comment: Engineer, petroleum  Tobacco Use   Smoking status: Former    Current packs/day: 0.00    Average packs/day: 0.3 packs/day for 20.0 years (5.0 ttl pk-yrs)    Types: Cigarettes    Start date: 09/07/1992    Quit date: 09/07/2012    Years since quitting: 11.5   Smokeless tobacco: Former    Types: Snuff    Quit date: 02/2022  Vaping Use   Vaping status: Never Used  Substance and Sexual Activity   Alcohol use: Not Currently    Comment: histoorically a heavy drinker (beer only), none for a couple months    Drug use: No   Sexual activity: Not Currently  Other Topics Concern   Not on file  Social History Narrative   Not on file   Social Drivers of Health   Financial Resource Strain: Low Risk  (11/21/2023)   Received from [facility]   Overall Financial Resource Strain (CARDIA)    Difficulty of Paying Living Expenses: Not hard at all  Food Insecurity: No Food Insecurity (11/21/2023)   Received from [facility]   Hunger Vital Sign    Within the past 12 months, you worried that your food would run out before you got the money to buy more.: Never true    Within the past 12 months, the food you bought just didn't last and you didn't have money to get more.: Never true  Transportation Needs: No  Transportation Needs (11/22/2023)   Received from [facility] - Transportation    In the past 12 months, has lack of transportation kept you from medical appointments or from getting medications?: No    Lack of Transportation (Non-Medical): No  Physical Activity: Inactive (10/09/2023)   Exercise Vital Sign    Days of Exercise per Week: 0 days    Minutes of Exercise per Session: 0 min  Stress: No Stress Concern Present (10/09/2023)   [employer] of Occupational [employer] - Occupational Stress Questionnaire    Feeling of Stress : Not at all  Social Connections: Moderately Isolated (10/09/2023)   Social Connection and Isolation Panel    Frequency of Communication with Friends and Family: Three times a week    Frequency of Social Gatherings with Friends and Family: Three times a week    Attends Religious Services: 1 to 4 times per year    Active Member of Clubs or Organizations: No    Attends Banker Meetings: Never    Marital Status: Never married  Intimate Partner Violence: Not At Risk (10/09/2023)   Humiliation, Afraid, Rape, and Kick questionnaire    Fear of Current or Ex-Partner: No    Emotionally Abused: No    Physically Abused: No    Sexually Abused: No   Family History  Problem Relation Age of Onset   Diabetes Mother    Colon cancer Neg Hx    Stomach cancer Neg Hx    Esophageal cancer Neg Hx       Review of Systems  All other systems reviewed and are negative.      Objective:   Physical Exam Vitals reviewed.  Constitutional:      General: He is not in acute distress.    Appearance: Normal appearance. He is normal weight. He is not ill-appearing or toxic-appearing.  Neck:     Vascular: No carotid bruit.  Cardiovascular:     Rate  and Rhythm: Regular rhythm. Tachycardia present.     Heart sounds: Normal heart sounds. No murmur heard.    No gallop.  Pulmonary:     Effort: No respiratory distress.     Breath sounds: Normal  breath sounds. No wheezing, rhonchi or rales.  Abdominal:     General: Abdomen is flat. Bowel sounds are normal.     Palpations: Abdomen is soft.  Musculoskeletal:       Arms:     Cervical back: Neck supple.     Right lower leg: No edema.     Left lower leg: No edema.  Lymphadenopathy:     Cervical: No cervical adenopathy.  Skin:    Findings: Lesion present.  Neurological:     General: No focal deficit present.     Mental Status: He is alert and oriented to person, place, and time. Mental status is at baseline.     Cranial Nerves: No cranial nerve deficit.     Motor: No weakness.   Location of previous biopsy site  2 cm ulcer on the forearm     Assessment & Plan:  Squamous cell carcinoma, arm, right - Plan: Pathology Report ([facility]) Lesion was prepped and draped in sterile fashion.  A 4 cm x 2 cm elliptical excision was performed around the lesion in its entirety down to the underlying subcutaneous tissue.  The lesion was then excised using a scalpel down to the underlying subcutaneous tissue including the dermis.  The lesion was sent to pathology in a labeled container.  The skin edges were then approximated using 6, simple interrupted 3-0 Ethilon sutures.  Stitches out in 1 week.  Wound care was discussed.

## 2024-03-24 DIAGNOSIS — Z941 Heart transplant status: Secondary | ICD-10-CM | POA: Diagnosis not present

## 2024-03-24 NOTE — Progress Notes (Signed)
 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 former smoker with h/o GERD, CAD, CHF, s/p heart transplant 02/05/2023, cirrhosis (cardiogenic, ETOH), Wilms tumor s/p left nephrectomy as a child, CKD Stage III, right hip OA scheduled for above procedure 03/25/2024 with Dr. Rogue Richard.    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 Last seen 02/13/2024. CT 09/12/2023 with near complete resolution of right middle lobe consolidation. Remains on PO valganciclovir. PICC line removed 7/14. 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    Clearance received from ID which states, From an infectious disease perspective, [name] is cleared for R THA. His ID issues are chronic and well-managed at this stage.   Pt last seen by cardiac transplant service 02/13/2024. Pt asymptomatic at this visit. Echo at this visit with normal LVEF. He currently remains on tacrolimus , prednisone , MMF.     Clearance from cardiology received which states pt is low to moderate risk.  Would give prednisone  40mg  on day of surgery and 20 mg next day and then back to usual dose   Clearance received from PCP which states pt is cleared medically.    Pt admitted in June for AKI, creatinine at highest 3.12. Per notes baseline creatinine 1.8.  Creatinine has been elevated since discharge, see below.  Discussed with PCP who advised pt to hold metformin and scheduled pt for repeat labs in their clinic.  Pt declined visit, states he has had labs recently.  Discussed with Dr. Maryclare.  Pt can stay on schedule, evaluate DOS.  Lab recheck DOS.    01/01/2024 Creatinine  2.46 12/30/2023 Creatinine 2.66 12/19/2023 Creatinine 2.4 12/12/2023 Creatinine 2.21    Pt cancelled DOS by Dr. Paul.  He has since seen PCP who states his new baseline creatinine is around 2.5.  Can proceed with surgery.   Most recent creatinine 1.99 on 03/12/2024.   Echo 02/13/2024 (Care Everywhere) NORMAL LEFT VENTRICULAR SYSTOLIC FUNCTION WITH NO LVH  ESTIMATED EF: 55%, CALC EF(3D): 53%  INDETERMINATE DIASTOLIC FUNCTION  NORMAL RIGHT VENTRICULAR SYSTOLIC FUNCTION  VALVULAR REGURGITATION: No AR, TRIVIAL MR, TRIVIAL PR, MILD TR  ESTIMATED RVSP: 34 mmHg (Normal)  NO VALVULAR STENOSIS     VS: BP (!) 158/103   Pulse 99   Temp 36.9 C (Oral)   Resp 16   Ht 5' 8 (1.727 m)   Wt 65.8 kg   SpO2 96%   BMI 22.05 kg/m   PROVIDERS: Richard Butler DASEN, MD   LABS: Labs reviewed: Acceptable for surgery. (all labs ordered are listed, but only abnormal results are displayed)  Labs Reviewed  COMPREHENSIVE METABOLIC PANEL WITH GFR - Abnormal; Notable for the following components:      Result Value   Glucose, Bld 133 (*)    BUN 26 (*)    Creatinine, Ser 1.99 (*)    AST 70 (*)    ALT 48 (*)    Alkaline Phosphatase 389 (*)    GFR, Estimated 40 (*)    All other components within normal limits  SURGICAL PCR SCREEN  CBC  TYPE AND SCREEN     IMAGES:   EKG:   CV:  Past Medical History:  Diagnosis Date   AICD (automatic cardioverter/defibrillator) present    Arthritis    Cardiomyopathy, nonischemic (HCC)    takes Digoxin  daily and Carvedilol  daily   CHF (congestive heart failure) (HCC)    takes Lasix  daily as well Aldactone    Chronic back pain    Chronic systolic heart failure (HCC)    Cirrhosis (HCC)    Depression    takes Prozac  daily   Diabetes mellitus without complication (HCC)    preDM   GERD (gastroesophageal reflux disease)    takes Omeprazole  daily   Hx of Alcohol consumption heavy    rare beer currently   hx of Tobacco abuse    quit smoking 2014    Hyperlipidemia    was on Simvastatin  but has been off a year   Hypertension    Insomnia    takes Ambien  as needed(just got script yesterday)   Orthostatic hypotension    Scoliosis    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 41 (XRT and chemo)    Past Surgical History:  Procedure Laterality Date   BIOPSY  10/31/2022   Procedure: BIOPSY;  Surgeon: Richard Elspeth SQUIBB, MD;  Location: [facility] ENDOSCOPY;  Service: Gastroenterology;;   ORIN MEDIATE RELEASE Right 01/09/2013   Procedure: CARPAL TUNNEL RELEASE;  Surgeon: Richard LITTIE Peru, MD;  [facility];  Service: Neurosurgery;  Laterality: Right;  RIGHT carpal tunnel release   CARPAL TUNNEL RELEASE Left 01/30/2013   Procedure: LEFT CARPAL TUNNEL RELEASE;  Surgeon: Richard LITTIE Peru, MD;  [facility];  Service: Neurosurgery;  Laterality: Left;  LEFT Carpal Tunnel release   CHEST TUBE INSERTION Right 09/09/2013   Procedure: INSERTION PLEURAL DRAINAGE CATHETER;  Surgeon: Richard Fleeta Ochoa, MD;  Location: [facility] OR;  Service: Thoracic;  Laterality: Right;   COLONOSCOPY WITH PROPOFOL  N/A 10/31/2022   Procedure: COLONOSCOPY WITH PROPOFOL ;  Surgeon: Richard Elspeth SQUIBB, MD;  Location: [facility] ENDOSCOPY;  Service: Gastroenterology;  Laterality: N/A;   HYDROCELE EXCISION Right 04/11/2017   Procedure: RIGHT HYDROCELECTOMY ADULT;  Surgeon: Richard Rush, MD;  Location: [facility]S;  Service: Urology;  Laterality: Right;   hydrocelectomy  2008   ICD  05/25/2011   [facility] [employer] SG lead/Energen single chamber device   IMPLANTABLE CARDIOVERTER DEFIBRILLATOR IMPLANT N/A 05/25/2011   Procedure: IMPLANTABLE CARDIOVERTER DEFIBRILLATOR IMPLANT;  Surgeon: Richard LELON Birmingham, MD;  Location: [facility] CATH LAB;  Service: Cardiovascular;  Laterality: N/A;   IR FLUORO GUIDE CV LINE RIGHT  01/16/2023   IR US  GUIDE VASC ACCESS RIGHT  01/16/2023   NEPHRECTOMY  36 yrs ago   Wilms Tumor   POLYPECTOMY  10/31/2022   Procedure: POLYPECTOMY;  Surgeon: Richard Elspeth SQUIBB, MD;   [facility];  Service: Gastroenterology;;   RIGHT HEART CATH N/A 01/29/2019   Procedure: RIGHT HEART CATH;  Surgeon: Cherrie Toribio SAUNDERS, MD;  [facility]AS[facility] CV LAB;  Service: Cardiovascular;  Laterality: N/A;   RIGHT HEART CATH N/A 09/08/2019   Procedure: RIGHT HEART CATH;  Surgeon: Cherrie Toribio SAUNDERS, MD;  [facility] LAB;  Service: Cardiovascular;  Laterality: N/A;   RIGHT HEART CATH N/A 01/15/2023   Procedure: RIGHT HEART CATH;  Surgeon: Cherrie Toribio SAUNDERS, MD;  [facility] LAB;  Service: Cardiovascular;  Laterality: N/A;   RIGHT/LEFT HEART CATH AND CORONARY ANGIOGRAPHY N/A 02/12/2017   Procedure: RIGHT/LEFT HEART CATH AND CORONARY ANGIOGRAPHY;  Surgeon: Cherrie Toribio SAUNDERS, MD;  [facility] LAB;  Service:  Cardiovascular;  Laterality: N/A;   ULNAR NERVE TRANSPOSITION Right 01/09/2013   Procedure: ULNAR NERVE DECOMPRESSION/TRANSPOSITION;  Surgeon: Richard LITTIE Peru, MD;  [facility];  Service: Neurosurgery;  Laterality: Right;  RIGHT ulnar nerve decompression    MEDICATIONS:  aspirin  EC 81 MG tablet   atovaquone (MEPRON) 750 MG/5ML suspension   azithromycin  (ZITHROMAX ) 500 MG tablet   Blood Glucose Monitoring Suppl DEVI   cetirizine  (ZYRTEC ) 10 MG tablet   colchicine  0.6 MG tablet   fluticasone  (FLONASE ) 50 MCG/ACT nasal spray   MAGNESIUM -OXIDE 400 (240 Mg) MG tablet   mycophenolate (CELLCEPT) 250 MG capsule   nebivolol (BYSTOLIC) 10 MG tablet   oxyCODONE  (OXY IR/ROXICODONE ) 5 MG immediate release tablet   pantoprazole  (PROTONIX ) 40 MG tablet   predniSONE  (DELTASONE ) 2.5 MG tablet   rosuvastatin (CRESTOR) 5 MG tablet   tacrolimus  (PROGRAF ) 1 MG capsule   No current facility-administered medications for this encounter.   Harlene Hoots Ward, PA-C [facility] [phone]

## 2024-03-24 NOTE — Anesthesia Preprocedure Evaluation (Signed)
 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)   breath sounds clear to auscultation       Cardiovascular hypertension,  Rhythm:Regular Rate:Normal  S/p OHT (04/2023)  TTE (02/13/24): NORMAL LEFT VENTRICULAR SYSTOLIC FUNCTION WITH NO LVH  ESTIMATED EF: 55%, CALC EF(3D): 53%  INDETERMINATE DIASTOLIC FUNCTION  NORMAL RIGHT VENTRICULAR SYSTOLIC FUNCTION  VALVULAR REGURGITATION: No AR, TRIVIAL MR, TRIVIAL PR, MILD TR  ESTIMATED RVSP: 34 mmHg (Normal)  NO VALVULAR STENOSIS     Neuro/Psych    GI/Hepatic   Endo/Other  diabetes    Renal/GU      Musculoskeletal   Abdominal   Peds  Hematology   Anesthesia Other Findings   Reproductive/Obstetrics                              Anesthesia Physical Anesthesia Plan  ASA: 3  Anesthesia Plan: General   Post-op Pain Management:    Induction: Intravenous  PONV Risk Score and Plan: 1 and Ondansetron , Midazolam  and Treatment may vary due to age or medical condition  Airway Management Planned: Oral ETT  Additional Equipment:   Intra-op Plan:   Post-operative Plan: Extubation in OR  Informed Consent:      Dental advisory given  Plan Discussed with: Surgeon and CRNA  Anesthesia Plan Comments: (See PAT note 03/12/24.  Extensive PMH including OHT in 2024 - will plan for GETA.   Update @ 11:10AM - Spoke with Transplant physician, Dr. Devore at [facility]. Prednisone  recommendations did not come  from him as he usually does not recommend prednisone  stress dosing as previously documented. However, if stress dose steroids are needed intraoperatively or requested by surgeon, OK with hydrocortisone 100 mg dose x 1 with continuation of his Prednisone  2.5 mg daily following surgery.)         Anesthesia Quick Evaluation

## 2024-03-25 ENCOUNTER — Ambulatory Visit (HOSPITAL_COMMUNITY)

## 2024-03-25 ENCOUNTER — Ambulatory Visit (HOSPITAL_BASED_OUTPATIENT_CLINIC_OR_DEPARTMENT_OTHER)

## 2024-03-25 ENCOUNTER — Ambulatory Visit (HOSPITAL_COMMUNITY): Payer: Self-pay | Admitting: Medical

## 2024-03-25 ENCOUNTER — Other Ambulatory Visit: Payer: Self-pay

## 2024-03-25 ENCOUNTER — Encounter (HOSPITAL_COMMUNITY)
Admission: RE | Disposition: A | Discharge status: Home / Self Care | Payer: Self-pay | Source: Home / Self Care | Attending: Orthopedic Surgery

## 2024-03-25 ENCOUNTER — Encounter (HOSPITAL_COMMUNITY): Payer: Self-pay | Admitting: Orthopedic Surgery

## 2024-03-25 ENCOUNTER — Ambulatory Visit (HOSPITAL_COMMUNITY)
Admission: RE | Admit: 2024-03-25 | Discharge: 2024-03-27 | Disposition: A | Discharge status: Home / Self Care | Attending: Orthopedic Surgery | Admitting: Orthopedic Surgery

## 2024-03-25 DIAGNOSIS — I110 Hypertensive heart disease with heart failure: Secondary | ICD-10-CM | POA: Diagnosis not present

## 2024-03-25 DIAGNOSIS — M87851 Other osteonecrosis, right femur: Secondary | ICD-10-CM

## 2024-03-25 DIAGNOSIS — I5043 Acute on chronic combined systolic (congestive) and diastolic (congestive) heart failure: Secondary | ICD-10-CM | POA: Diagnosis not present

## 2024-03-25 DIAGNOSIS — M1611 Unilateral primary osteoarthritis, right hip: Secondary | ICD-10-CM | POA: Diagnosis present

## 2024-03-25 DIAGNOSIS — Z96641 Presence of right artificial hip joint: Secondary | ICD-10-CM | POA: Diagnosis not present

## 2024-03-25 DIAGNOSIS — Z792 Long term (current) use of antibiotics: Secondary | ICD-10-CM | POA: Insufficient documentation

## 2024-03-25 DIAGNOSIS — Z941 Heart transplant status: Secondary | ICD-10-CM | POA: Insufficient documentation

## 2024-03-25 DIAGNOSIS — I130 Hypertensive heart and chronic kidney disease with heart failure and stage 1 through stage 4 chronic kidney disease, or unspecified chronic kidney disease: Secondary | ICD-10-CM

## 2024-03-25 DIAGNOSIS — M87051 Idiopathic aseptic necrosis of right femur: Secondary | ICD-10-CM | POA: Diagnosis not present

## 2024-03-25 DIAGNOSIS — D62 Acute posthemorrhagic anemia: Secondary | ICD-10-CM | POA: Diagnosis not present

## 2024-03-25 DIAGNOSIS — Z8619 Personal history of other infectious and parasitic diseases: Secondary | ICD-10-CM | POA: Diagnosis not present

## 2024-03-25 DIAGNOSIS — Z87891 Personal history of nicotine dependence: Secondary | ICD-10-CM | POA: Insufficient documentation

## 2024-03-25 DIAGNOSIS — E1122 Type 2 diabetes mellitus with diabetic chronic kidney disease: Secondary | ICD-10-CM | POA: Insufficient documentation

## 2024-03-25 DIAGNOSIS — I5042 Chronic combined systolic (congestive) and diastolic (congestive) heart failure: Secondary | ICD-10-CM | POA: Diagnosis not present

## 2024-03-25 DIAGNOSIS — M879 Osteonecrosis, unspecified: Secondary | ICD-10-CM | POA: Insufficient documentation

## 2024-03-25 DIAGNOSIS — N189 Chronic kidney disease, unspecified: Secondary | ICD-10-CM | POA: Insufficient documentation

## 2024-03-25 DIAGNOSIS — M25751 Osteophyte, right hip: Secondary | ICD-10-CM | POA: Insufficient documentation

## 2024-03-25 DIAGNOSIS — Z471 Aftercare following joint replacement surgery: Secondary | ICD-10-CM | POA: Diagnosis not present

## 2024-03-25 DIAGNOSIS — E119 Type 2 diabetes mellitus without complications: Secondary | ICD-10-CM

## 2024-03-25 DIAGNOSIS — E1169 Type 2 diabetes mellitus with other specified complication: Secondary | ICD-10-CM

## 2024-03-25 HISTORY — PX: TOTAL HIP ARTHROPLASTY: SHX124

## 2024-03-25 LAB — PATHOLOGY REPORT

## 2024-03-25 LAB — GLUCOSE, CAPILLARY
Glucose-Capillary: 64 mg/dL — ABNORMAL LOW (ref 70–99)
Glucose-Capillary: 167 mg/dL — ABNORMAL HIGH (ref 70–99)

## 2024-03-25 LAB — TISSUE SPECIMEN

## 2024-03-25 SURGERY — ARTHROPLASTY, HIP, TOTAL, ANTERIOR APPROACH
Anesthesia: General | Site: Hip | Laterality: Right

## 2024-03-25 MED ORDER — VANCOMYCIN HCL 1000 MG IV SOLR
INTRAVENOUS | Status: DC | PRN
  Administered 2024-03-25: 1000 mg via TOPICAL

## 2024-03-25 MED ORDER — HYDROCORTISONE SOD SUC (PF) 100 MG IJ SOLR: 100.0000 mg | Freq: Once | INTRAMUSCULAR | Status: DC

## 2024-03-25 MED ORDER — KETOROLAC TROMETHAMINE 30 MG/ML IJ SOLN
INTRAMUSCULAR | Status: AC
  Filled 2024-03-25: qty 1

## 2024-03-25 MED ORDER — CEFAZOLIN SODIUM-DEXTROSE 2-4 GM/100ML-% IV SOLN
2.0000 g | INTRAVENOUS | Status: AC
  Administered 2024-03-25: 2 g via INTRAVENOUS
  Filled 2024-03-25: qty 100

## 2024-03-25 MED ORDER — PROPOFOL 10 MG/ML IV BOLUS
INTRAVENOUS | Status: AC
  Filled 2024-03-25: qty 20

## 2024-03-25 MED ORDER — FENTANYL CITRATE (PF) 100 MCG/2ML IJ SOLN
INTRAMUSCULAR | Status: AC
  Filled 2024-03-25: qty 2

## 2024-03-25 MED ORDER — LIDOCAINE HCL (PF) 2 % IJ SOLN
INTRAMUSCULAR | Status: AC
  Filled 2024-03-25: qty 5

## 2024-03-25 MED ORDER — ATOVAQUONE 750 MG/5ML PO SUSP
750.0000 mg | Freq: Every day | ORAL | Status: DC
  Administered 2024-03-25 – 2024-03-27 (×3): 750 mg via ORAL
  Filled 2024-03-25 (×3): qty 5

## 2024-03-25 MED ORDER — ROCURONIUM BROMIDE 10 MG/ML (PF) SYRINGE
PREFILLED_SYRINGE | INTRAVENOUS | Status: AC
  Filled 2024-03-25: qty 10

## 2024-03-25 MED ORDER — ROCURONIUM BROMIDE 100 MG/10ML IV SOLN
INTRAVENOUS | Status: DC | PRN
  Administered 2024-03-25: 60 mg via INTRAVENOUS
  Administered 2024-03-25: 10 mg via INTRAVENOUS

## 2024-03-25 MED ORDER — PHENOL 1.4 % MT LIQD: 1.0000 | OROMUCOSAL | Status: DC | PRN

## 2024-03-25 MED ORDER — ONDANSETRON HCL 4 MG/2ML IJ SOLN: 4.0000 mg | Freq: Four times a day (QID) | INTRAMUSCULAR | Status: DC | PRN

## 2024-03-25 MED ORDER — ORAL CARE MOUTH RINSE: 15.0000 mL | Freq: Once | OROMUCOSAL | Status: AC

## 2024-03-25 MED ORDER — POVIDONE-IODINE 10 % EX SWAB: 2.0000 | Freq: Once | CUTANEOUS | Status: DC

## 2024-03-25 MED ORDER — BUPIVACAINE-EPINEPHRINE (PF) 0.25% -1:200000 IJ SOLN
INTRAMUSCULAR | Status: AC
  Filled 2024-03-25: qty 30

## 2024-03-25 MED ORDER — HYDROCORTISONE SOD SUC (PF) 100 MG IJ SOLR
100.0000 mg | Freq: Once | INTRAMUSCULAR | Status: AC
Start: 2024-03-25 — End: 2024-03-25
  Administered 2024-03-25: 100 mg via INTRAVENOUS
  Filled 2024-03-25: qty 2

## 2024-03-25 MED ORDER — TRANEXAMIC ACID-NACL 1000-0.7 MG/100ML-% IV SOLN
1000.0000 mg | INTRAVENOUS | Status: AC
  Administered 2024-03-25: 1000 mg via INTRAVENOUS
  Filled 2024-03-25: qty 100

## 2024-03-25 MED ORDER — PHENYLEPHRINE HCL (PRESSORS) 10 MG/ML IV SOLN
INTRAVENOUS | Status: AC
  Filled 2024-03-25: qty 1

## 2024-03-25 MED ORDER — OXYCODONE HCL 5 MG PO TABS: 5.0000 mg | ORAL_TABLET | Freq: Once | ORAL | Status: DC | PRN

## 2024-03-25 MED ORDER — DEXAMETHASONE SODIUM PHOSPHATE 4 MG/ML IJ SOLN
INTRAMUSCULAR | Status: DC | PRN
  Administered 2024-03-25: 10 mg via INTRAVENOUS

## 2024-03-25 MED ORDER — PREDNISONE 5 MG PO TABS
2.5000 mg | ORAL_TABLET | Freq: Every day | ORAL | Status: AC
Start: 2024-03-26 — End: ?
  Administered 2024-03-26 – 2024-03-27 (×2): 2.5 mg via ORAL
  Filled 2024-03-25 (×2): qty 1

## 2024-03-25 MED ORDER — MENTHOL 3 MG MT LOZG: 1.0000 | LOZENGE | OROMUCOSAL | Status: DC | PRN

## 2024-03-25 MED ORDER — PHENYLEPHRINE HCL-NACL 20-0.9 MG/250ML-% IV SOLN
INTRAVENOUS | Status: DC | PRN
Start: 2024-03-25 — End: 2024-03-25
  Administered 2024-03-25: 40 ug/min via INTRAVENOUS

## 2024-03-25 MED ORDER — HYDROCODONE-ACETAMINOPHEN 7.5-325 MG PO TABS
1.0000 | ORAL_TABLET | ORAL | Status: DC | PRN
  Administered 2024-03-25: 2 via ORAL
  Administered 2024-03-25: 1 via ORAL
  Administered 2024-03-26 – 2024-03-27 (×5): 2 via ORAL
  Filled 2024-03-25 (×6): qty 2
  Filled 2024-03-25: qty 1

## 2024-03-25 MED ORDER — LACTATED RINGERS IV SOLN
INTRAVENOUS | Status: DC
Start: 2024-03-25 — End: 2024-03-25

## 2024-03-25 MED ORDER — ACETAMINOPHEN 325 MG PO TABS: 325.0000 mg | ORAL_TABLET | Freq: Four times a day (QID) | ORAL | Status: DC | PRN

## 2024-03-25 MED ORDER — SENNA 8.6 MG PO TABS
1.0000 | ORAL_TABLET | Freq: Two times a day (BID) | ORAL | Status: DC
  Filled 2024-03-25 (×3): qty 1

## 2024-03-25 MED ORDER — DEXAMETHASONE SOD PHOSPHATE PF 10 MG/ML IJ SOLN
INTRAMUSCULAR | Status: DC | PRN
  Administered 2024-03-25: 10 mg via INTRAVENOUS

## 2024-03-25 MED ORDER — HYDROCODONE-ACETAMINOPHEN 5-325 MG PO TABS: 1.0000 | ORAL_TABLET | ORAL | Status: DC | PRN

## 2024-03-25 MED ORDER — ACETAMINOPHEN 10 MG/ML IV SOLN: 1000.0000 mg | Freq: Once | INTRAVENOUS | Status: DC | PRN

## 2024-03-25 MED ORDER — METOCLOPRAMIDE HCL 5 MG/ML IJ SOLN: 5.0000 mg | Freq: Three times a day (TID) | INTRAMUSCULAR | Status: DC | PRN

## 2024-03-25 MED ORDER — MYCOPHENOLATE MOFETIL 250 MG PO CAPS
250.0000 mg | ORAL_CAPSULE | Freq: Two times a day (BID) | ORAL | Status: DC
  Administered 2024-03-25 – 2024-03-27 (×4): 250 mg via ORAL
  Filled 2024-03-25 (×4): qty 1

## 2024-03-25 MED ORDER — MIDAZOLAM HCL 5 MG/5ML IJ SOLN
INTRAMUSCULAR | Status: DC | PRN
  Administered 2024-03-25: 2 mg via INTRAVENOUS

## 2024-03-25 MED ORDER — METHOCARBAMOL 1000 MG/10ML IJ SOLN: 500.0000 mg | Freq: Four times a day (QID) | INTRAMUSCULAR | Status: DC | PRN

## 2024-03-25 MED ORDER — SODIUM CHLORIDE (PF) 0.9 % IJ SOLN
INTRAMUSCULAR | Status: AC
  Filled 2024-03-25: qty 50

## 2024-03-25 MED ORDER — PRONTOSAN WOUND IRRIGATION OPTIME
TOPICAL | Status: DC | PRN
  Administered 2024-03-25: 1 via TOPICAL

## 2024-03-25 MED ORDER — LACTATED RINGERS IV SOLN: INTRAVENOUS | Status: DC

## 2024-03-25 MED ORDER — ACETAMINOPHEN 500 MG PO TABS: 1000.0000 mg | ORAL_TABLET | Freq: Once | ORAL | Status: DC

## 2024-03-25 MED ORDER — PHENYLEPHRINE 80 MCG/ML (10ML) SYRINGE FOR IV PUSH (FOR BLOOD PRESSURE SUPPORT)
PREFILLED_SYRINGE | INTRAVENOUS | Status: AC
  Filled 2024-03-25: qty 10

## 2024-03-25 MED ORDER — ONDANSETRON HCL 4 MG/2ML IJ SOLN
INTRAMUSCULAR | Status: AC
  Filled 2024-03-25: qty 2

## 2024-03-25 MED ORDER — TACROLIMUS 1 MG PO CAPS
3.0000 mg | ORAL_CAPSULE | Freq: Two times a day (BID) | ORAL | Status: DC
  Administered 2024-03-25 – 2024-03-27 (×4): 3 mg via ORAL
  Filled 2024-03-25 (×4): qty 3

## 2024-03-25 MED ORDER — SUGAMMADEX SODIUM 200 MG/2ML IV SOLN
INTRAVENOUS | Status: DC | PRN
  Administered 2024-03-25: 200 mg via INTRAVENOUS

## 2024-03-25 MED ORDER — PROPOFOL 10 MG/ML IV BOLUS
INTRAVENOUS | Status: DC | PRN
  Administered 2024-03-25: 100 mg via INTRAVENOUS

## 2024-03-25 MED ORDER — CEFAZOLIN SODIUM-DEXTROSE 2-4 GM/100ML-% IV SOLN
2.0000 g | Freq: Four times a day (QID) | INTRAVENOUS | Status: AC
  Administered 2024-03-25 – 2024-03-26 (×2): 2 g via INTRAVENOUS
  Filled 2024-03-25 (×2): qty 100

## 2024-03-25 MED ORDER — MAGNESIUM OXIDE -MG SUPPLEMENT 400 (240 MG) MG PO TABS
400.0000 mg | ORAL_TABLET | Freq: Every day | ORAL | Status: DC
  Administered 2024-03-26 – 2024-03-27 (×2): 400 mg via ORAL
  Filled 2024-03-25 (×2): qty 1

## 2024-03-25 MED ORDER — METHYLPREDNISOLONE SODIUM SUCC 125 MG IJ SOLR
INTRAMUSCULAR | Status: AC
  Filled 2024-03-25: qty 2

## 2024-03-25 MED ORDER — ONDANSETRON HCL 4 MG/2ML IJ SOLN
INTRAMUSCULAR | Status: DC | PRN
  Administered 2024-03-25: 4 mg via INTRAVENOUS

## 2024-03-25 MED ORDER — FENTANYL CITRATE (PF) 50 MCG/ML IJ SOSY
PREFILLED_SYRINGE | INTRAMUSCULAR | Status: AC
  Filled 2024-03-25: qty 2

## 2024-03-25 MED ORDER — SUGAMMADEX SODIUM 200 MG/2ML IV SOLN
INTRAVENOUS | Status: AC
  Filled 2024-03-25: qty 2

## 2024-03-25 MED ORDER — MORPHINE SULFATE (PF) 4 MG/ML IV SOLN: 0.5000 mg | INTRAVENOUS | Status: DC | PRN

## 2024-03-25 MED ORDER — DOCUSATE SODIUM 100 MG PO CAPS
100.0000 mg | ORAL_CAPSULE | Freq: Two times a day (BID) | ORAL | Status: DC
  Filled 2024-03-25 (×3): qty 1

## 2024-03-25 MED ORDER — METHOCARBAMOL 500 MG PO TABS
500.0000 mg | ORAL_TABLET | Freq: Four times a day (QID) | ORAL | Status: DC | PRN
  Administered 2024-03-25 – 2024-03-27 (×5): 500 mg via ORAL
  Filled 2024-03-25 (×5): qty 1

## 2024-03-25 MED ORDER — SODIUM CHLORIDE (PF) 0.9 % IJ SOLN
INTRAMUSCULAR | Status: DC | PRN
  Administered 2024-03-25: 60 mL

## 2024-03-25 MED ORDER — PANTOPRAZOLE SODIUM 40 MG PO TBEC
40.0000 mg | DELAYED_RELEASE_TABLET | Freq: Every day | ORAL | Status: DC
  Administered 2024-03-26 – 2024-03-27 (×2): 40 mg via ORAL
  Filled 2024-03-25 (×2): qty 1

## 2024-03-25 MED ORDER — ISOPROPYL ALCOHOL 70 % SOLN
Status: AC
  Filled 2024-03-25: qty 480

## 2024-03-25 MED ORDER — CHLORHEXIDINE GLUCONATE 0.12 % MT SOLN
15.0000 mL | Freq: Once | OROMUCOSAL | Status: AC
  Administered 2024-03-25: 15 mL via OROMUCOSAL

## 2024-03-25 MED ORDER — HYDROMORPHONE HCL 2 MG/ML IJ SOLN
INTRAMUSCULAR | Status: AC
  Filled 2024-03-25: qty 1

## 2024-03-25 MED ORDER — METOCLOPRAMIDE HCL 5 MG PO TABS: 5.0000 mg | ORAL_TABLET | Freq: Three times a day (TID) | ORAL | Status: DC | PRN

## 2024-03-25 MED ORDER — ONDANSETRON HCL 4 MG/2ML IJ SOLN: INTRAMUSCULAR | Status: DC | PRN

## 2024-03-25 MED ORDER — VANCOMYCIN HCL 1000 MG IV SOLR
INTRAVENOUS | Status: AC
  Filled 2024-03-25: qty 20

## 2024-03-25 MED ORDER — NYSTATIN 100000 UNIT/GM EX POWD
Freq: Two times a day (BID) | CUTANEOUS | Status: DC
  Filled 2024-03-25: qty 15

## 2024-03-25 MED ORDER — HYDROMORPHONE HCL 1 MG/ML IJ SOLN
INTRAMUSCULAR | Status: DC | PRN
  Administered 2024-03-25: 1 mg via INTRAVENOUS

## 2024-03-25 MED ORDER — ONDANSETRON HCL 4 MG/2ML IJ SOLN: 4.0000 mg | Freq: Once | INTRAMUSCULAR | Status: DC | PRN

## 2024-03-25 MED ORDER — ASPIRIN 81 MG PO CHEW
81.0000 mg | CHEWABLE_TABLET | Freq: Two times a day (BID) | ORAL | Status: DC
  Administered 2024-03-25 – 2024-03-27 (×4): 81 mg via ORAL
  Filled 2024-03-25 (×4): qty 1

## 2024-03-25 MED ORDER — MIDAZOLAM HCL 2 MG/2ML IJ SOLN
INTRAMUSCULAR | Status: AC
  Filled 2024-03-25: qty 2

## 2024-03-25 MED ORDER — OXYCODONE HCL 5 MG/5ML PO SOLN: 5.0000 mg | Freq: Once | ORAL | Status: DC | PRN

## 2024-03-25 MED ORDER — POLYETHYLENE GLYCOL 3350 17 G PO PACK: 17.0000 g | PACK | Freq: Every day | ORAL | Status: DC | PRN

## 2024-03-25 MED ORDER — DROPERIDOL 2.5 MG/ML IJ SOLN: 0.6250 mg | Freq: Once | INTRAMUSCULAR | Status: DC | PRN

## 2024-03-25 MED ORDER — LORATADINE 10 MG PO TABS
10.0000 mg | ORAL_TABLET | Freq: Every day | ORAL | Status: DC
  Administered 2024-03-26 – 2024-03-27 (×2): 10 mg via ORAL
  Filled 2024-03-25 (×2): qty 1

## 2024-03-25 MED ORDER — NEBIVOLOL HCL 10 MG PO TABS
10.0000 mg | ORAL_TABLET | Freq: Every day | ORAL | Status: DC
  Administered 2024-03-25 – 2024-03-27 (×3): 10 mg via ORAL
  Filled 2024-03-25 (×3): qty 1

## 2024-03-25 MED ORDER — FENTANYL CITRATE (PF) 50 MCG/ML IJ SOSY
25.0000 ug | PREFILLED_SYRINGE | INTRAMUSCULAR | Status: DC | PRN
  Administered 2024-03-25 (×2): 25 ug via INTRAVENOUS
  Administered 2024-03-25: 50 ug via INTRAVENOUS

## 2024-03-25 MED ORDER — FENTANYL CITRATE (PF) 100 MCG/2ML IJ SOLN
INTRAMUSCULAR | Status: DC | PRN
  Administered 2024-03-25 (×2): 50 ug via INTRAVENOUS

## 2024-03-25 MED ORDER — SODIUM CHLORIDE 0.9 % IR SOLN
Status: DC | PRN
  Administered 2024-03-25: 1000 mL
  Administered 2024-03-25: 250 mL
  Administered 2024-03-25: 3000 mL

## 2024-03-25 MED ORDER — LIDOCAINE HCL (CARDIAC) PF 100 MG/5ML IV SOSY
PREFILLED_SYRINGE | INTRAVENOUS | Status: DC | PRN
  Administered 2024-03-25: 60 mg via INTRAVENOUS

## 2024-03-25 MED ORDER — SODIUM CHLORIDE 0.9 % IV SOLN: INTRAVENOUS | Status: DC

## 2024-03-25 MED ORDER — ROSUVASTATIN CALCIUM 5 MG PO TABS
5.0000 mg | ORAL_TABLET | Freq: Every evening | ORAL | Status: DC
  Administered 2024-03-25 – 2024-03-26 (×2): 5 mg via ORAL
  Filled 2024-03-25 (×2): qty 1

## 2024-03-25 MED ORDER — AZITHROMYCIN 250 MG PO TABS
500.0000 mg | ORAL_TABLET | Freq: Every day | ORAL | Status: DC
  Administered 2024-03-26 – 2024-03-27 (×2): 500 mg via ORAL
  Filled 2024-03-25 (×3): qty 2

## 2024-03-25 MED ORDER — PHENYLEPHRINE HCL (PRESSORS) 10 MG/ML IV SOLN
INTRAVENOUS | Status: DC | PRN
Start: 2024-03-25 — End: 2024-03-25
  Administered 2024-03-25 (×2): 80 ug via INTRAVENOUS

## 2024-03-25 MED ORDER — DIPHENHYDRAMINE HCL 12.5 MG/5ML PO ELIX: 12.5000 mg | ORAL_SOLUTION | ORAL | Status: DC | PRN

## 2024-03-25 MED ORDER — FLUTICASONE PROPIONATE 50 MCG/ACT NA SUSP: 2.0000 | Freq: Every day | NASAL | Status: DC | PRN

## 2024-03-25 MED ORDER — ALUM & MAG HYDROXIDE-SIMETH 200-200-20 MG/5ML PO SUSP: 30.0000 mL | ORAL | Status: DC | PRN

## 2024-03-25 MED ORDER — ONDANSETRON HCL 4 MG PO TABS: 4.0000 mg | ORAL_TABLET | Freq: Four times a day (QID) | ORAL | Status: DC | PRN

## 2024-03-25 MED ORDER — SODIUM CHLORIDE (PF) 0.9 % IJ SOLN
INTRAMUSCULAR | Status: AC
  Filled 2024-03-25: qty 30

## 2024-03-25 SURGICAL SUPPLY — 48 items
BAG COUNTER SPONGE SURGICOUNT (BAG) IMPLANT
BAG ZIPLOCK 12X15 (MISCELLANEOUS) ×1 IMPLANT
BLADE SAW RECIPROCATING 77.5 (BLADE) IMPLANT
BLADE SAW SGTL 18X104X1.24 (BLADE) ×1 IMPLANT
CHLORAPREP W/TINT 26 (MISCELLANEOUS) ×1 IMPLANT
COVER PERINEAL POST (MISCELLANEOUS) ×1 IMPLANT
COVER SURGICAL LIGHT HANDLE (MISCELLANEOUS) ×1 IMPLANT
DERMABOND ADVANCED .7 DNX12 (GAUZE/BANDAGES/DRESSINGS) ×1 IMPLANT
DRAPE IMP U-DRAPE 54X76 (DRAPES) ×1 IMPLANT
DRAPE SHEET LG 3/4 BI-LAMINATE (DRAPES) ×3 IMPLANT
DRAPE STERI IOBAN 125X83 (DRAPES) ×1 IMPLANT
DRAPE U-SHAPE 47X51 STRL (DRAPES) ×2 IMPLANT
DRSG AQUACEL AG ADV 3.5X10 (GAUZE/BANDAGES/DRESSINGS) ×1 IMPLANT
ELECT REM PT RETURN 15FT ADLT (MISCELLANEOUS) ×1 IMPLANT
GAUZE SPONGE 4X4 12PLY STRL (GAUZE/BANDAGES/DRESSINGS) ×1 IMPLANT
GLOVE BIO SURGEON STRL SZ7 (GLOVE) ×1 IMPLANT
GLOVE BIO SURGEON STRL SZ8.5 (GLOVE) ×2 IMPLANT
GLOVE BIOGEL PI IND STRL 7.5 (GLOVE) ×1 IMPLANT
GLOVE BIOGEL PI IND STRL 8.5 (GLOVE) ×1 IMPLANT
GOWN SPEC L3 XXLG W/TWL (GOWN DISPOSABLE) ×1 IMPLANT
GOWN STRL REUS W/ TWL XL LVL3 (GOWN DISPOSABLE) ×1 IMPLANT
HEAD CERAMIC BIOLOX 36 T1 STD (Head) IMPLANT
HOLDER FOLEY CATH W/STRAP (MISCELLANEOUS) ×1 IMPLANT
HOOD PEEL AWAY T7 (MISCELLANEOUS) ×3 IMPLANT
KIT TURNOVER KIT A (KITS) ×1 IMPLANT
LINER ACETAB NN G7 F 36 (Liner) IMPLANT
MANIFOLD NEPTUNE II (INSTRUMENTS) ×1 IMPLANT
MARKER SKIN DUAL TIP RULER LAB (MISCELLANEOUS) ×1 IMPLANT
NDL SAFETY ECLIPSE 18X1.5 (NEEDLE) ×1 IMPLANT
NDL SPNL 18GX3.5 QUINCKE PK (NEEDLE) ×1 IMPLANT
NEEDLE SPNL 18GX3.5 QUINCKE PK (NEEDLE) ×1 IMPLANT
PACK ANTERIOR HIP CUSTOM (KITS) ×1 IMPLANT
PENCIL SMOKE EVACUATOR (MISCELLANEOUS) ×1 IMPLANT
SEALER BIPOLAR AQUA 6.0 (INSTRUMENTS) ×1 IMPLANT
SET HNDPC FAN SPRY TIP SCT (DISPOSABLE) ×1 IMPLANT
SHELL ACET G7 4H 56 SZF (Shell) IMPLANT
SOLUTION PRONTOSAN WOUND 350ML (IRRIGATION / IRRIGATOR) ×1 IMPLANT
SPIKE FLUID TRANSFER (MISCELLANEOUS) ×1 IMPLANT
STAPLER SKIN PROX 35W (STAPLE) IMPLANT
STEM FEMORAL 17X154 (Stem) IMPLANT
SUT MNCRL AB 3-0 PS2 18 (SUTURE) ×1 IMPLANT
SUT MON AB 2-0 CT1 36 (SUTURE) ×1 IMPLANT
SUT STRATAFIX 14 PDO 48 VLT (SUTURE) ×1 IMPLANT
SUT VIC AB 2-0 CT1 TAPERPNT 27 (SUTURE) IMPLANT
SYR 3ML LL SCALE MARK (SYRINGE) ×1 IMPLANT
TOWEL GREEN STERILE FF (TOWEL DISPOSABLE) ×1 IMPLANT
TRAY FOLEY MTR SLVR 16FR STAT (SET/KITS/TRAYS/PACK) IMPLANT
TUBE SUCTION HIGH CAP CLEAR NV (SUCTIONS) ×1 IMPLANT

## 2024-03-25 NOTE — Plan of Care (Signed)
  Problem: Coping: Goal: Level of anxiety will decrease Outcome: Progressing   Problem: Pain Managment: Goal: General experience of comfort will improve and/or be controlled Outcome: Progressing   Problem: Safety: Goal: Ability to remain free from injury will improve Outcome: Progressing   Problem: Elimination: Goal: Will not experience complications related to bowel motility Outcome: Progressing Goal: Will not experience complications related to urinary retention Outcome: Progressing

## 2024-03-25 NOTE — Evaluation (Signed)
 Physical Therapy Evaluation Patient Details Name: [name] MRN: [id number] DOB: [birth year] Today's Date: 03/25/2024  History of Present Illness  53 yo male presents to therapy s/p R THA, anterior approach secondary to failure of conservative measures and avascular necrosis. Pt PMH includes but is not limited to: nocardia infection, AKI, CMV, R hip fx (06/27/2023), anemia, CKD, heart transplant (2024), colon ca, s and d CHF, GERD, chronic pain, substance abuse, cirrhosis of liver, HLD, scoliosis, AICD, depression , DM II, Wilms tumor, and cardiac surgeries.  Clinical Impression    [name] is a 53 y.o. male POD 0 s/p  R THA. Patient reports mod I with SPC with mobility at baseline. Patient is now limited by functional impairments (see PT problem list below) and requires S for bed mobility and CGA for transfers. Patient was able to ambulate 75 feet with RW and CGA level of assist. Patient instructed in exercise to facilitate ROM and circulation to manage edema. Patient will benefit from continued skilled PT interventions to address impairments and progress towards PLOF. Acute PT will follow to progress mobility and stair training in preparation for safe discharge home with family support and HEP.       If plan is discharge home, recommend the following: A little help with walking and/or transfers;A little help with bathing/dressing/bathroom;Assistance with cooking/housework;Assist for transportation;Help with stairs or ramp for entrance   Can travel by private vehicle        Equipment Recommendations None recommended by PT  Recommendations for Other Services       Functional Status Assessment Patient has had a recent decline in their functional status and demonstrates the ability to make significant improvements in function in a reasonable and predictable amount of time.     Precautions / Restrictions Precautions Precautions: Fall;ICD/Pacemaker Restrictions Weight Bearing  Restrictions Per Provider Order: Yes RLE Weight Bearing Per Provider Order: Weight bearing as tolerated      Mobility  Bed Mobility Overal bed mobility: Needs Assistance Bed Mobility: Supine to Sit     Supine to sit: Supervision     General bed mobility comments: min cues    Transfers Overall transfer level: Needs assistance Equipment used: Rolling walker (2 wheels) Transfers: Sit to/from Stand Sit to Stand: Contact guard assist           General transfer comment: min cues    Ambulation/Gait Ambulation/Gait assistance: Contact guard assist Gait Distance (Feet): 75 Feet Assistive device: Rolling walker (2 wheels) Gait Pattern/deviations: Step-to pattern, Decreased stance time - right, Antalgic, Trunk flexed Gait velocity: decreased     General Gait Details: slight trunk flexion with B UE support at RW to offload R LE in stance phase, pt noted to be vaulting on L side with absent heel strike pt states he has been ambulating like that compensating for the R hip pain for the last 10 months, pt having difficulty with more normalized gait pattern and will require reinforcement  Stairs            Wheelchair Mobility     Tilt Bed    Modified Rankin (Stroke Patients Only)       Balance Overall balance assessment: Needs assistance Sitting-balance support: Feet supported Sitting balance-Leahy Scale: Good     Standing balance support: Bilateral upper extremity supported, During functional activity, Reliant on assistive device for balance Standing balance-Leahy Scale: Poor  Pertinent Vitals/Pain Pain Assessment Pain Assessment: 0-10 Pain Score: 7  Pain Location: R hip and LE Pain Descriptors / Indicators: Aching, Constant, Discomfort, Dull, Grimacing, Operative site guarding Pain Intervention(s): Limited activity within patient's tolerance, Monitored during session, Premedicated before session, Repositioned, Ice  applied    Home Living Family/patient expects to be discharged to:: Private residence Living Arrangements: Children Available Help at Discharge: Family Type of Home: House Home Access: Stairs to enter Entrance Stairs-Rails: Right;Left;Can reach both Entrance Stairs-Number of Steps: 1   Home Layout: Two level;Able to live on main level with bedroom/bathroom Home Equipment: Rolling Walker (2 wheels);Cane - single point      Prior Function Prior Level of Function : Independent/Modified Independent;Driving             Mobility Comments: mod I SPC for all ADLs, self care tasks and IADLs       Extremity/Trunk Assessment        Lower Extremity Assessment Lower Extremity Assessment: RLE deficits/detail RLE Deficits / Details: ankle DF/FP 5/5 RLE Sensation: WNL    Cervical / Trunk Assessment Cervical / Trunk Assessment: Normal  Communication   Communication Communication: No apparent difficulties    Cognition Arousal: Alert Behavior During Therapy: WFL for tasks assessed/performed   PT - Cognitive impairments: No apparent impairments                         Following commands: Intact       Cueing       General Comments      Exercises Total Joint Exercises Ankle Circles/Pumps: AROM, Both, 10 reps   Assessment/Plan    PT Assessment Patient needs continued PT services  PT Problem List Decreased strength;Decreased range of motion;Decreased activity tolerance;Decreased balance;Decreased mobility;Decreased coordination;Pain       PT Treatment Interventions DME instruction;Gait training;Stair training;Functional mobility training;Balance training;Therapeutic activities;Therapeutic exercise;Neuromuscular re-education;Patient/family education;Modalities    PT Goals (Current goals can be found in the Care Plan section)  Acute Rehab PT Goals Patient Stated Goal: to be able to go deer hunting this season PT Goal Formulation: With patient Time For Goal  Achievement: 04/08/24 Potential to Achieve Goals: Good    Frequency 7X/week     Co-evaluation               AM-PAC PT 6 Clicks Mobility  Outcome Measure Help needed turning from your back to your side while in a flat bed without using bedrails?: None Help needed moving from lying on your back to sitting on the side of a flat bed without using bedrails?: A Little Help needed moving to and from a bed to a chair (including a wheelchair)?: A Little Help needed standing up from a chair using your arms (e.g., wheelchair or bedside chair)?: A Little Help needed to walk in hospital room?: A Little Help needed climbing 3-5 steps with a railing? : A Lot 6 Click Score: 18    End of Session Equipment Utilized During Treatment: Gait belt Activity Tolerance: Patient tolerated treatment well;No increased pain Patient left: in chair;with call bell/phone within reach;with chair alarm set Nurse Communication: Mobility status PT Visit Diagnosis: Unsteadiness on feet (R26.81);Other abnormalities of gait and mobility (R26.89);Muscle weakness (generalized) (M62.81);Difficulty in walking, not elsewhere classified (R26.2);Pain Pain - Right/Left: Right Pain - part of body: Hip;Leg    Time: 8193-8173 PT Time Calculation (min) (ACUTE ONLY): 20 min   Charges:   PT Evaluation $PT Eval Low Complexity: 1 Low   PT  General Charges $$ ACUTE PT VISIT: 1 Visit         [place], PT Acute Rehab   [place] VEAR Drone 03/25/2024, 6:59 PM

## 2024-03-25 NOTE — Anesthesia Postprocedure Evaluation (Signed)
 Anesthesia Post Note  Patient: [name]  Procedure(s) Performed: ARTHROPLASTY, HIP, TOTAL, ANTERIOR APPROACH (Right: Hip)     Patient location during evaluation: PACU Anesthesia Type: General Level of consciousness: awake Pain management: pain level controlled Vital Signs Assessment: post-procedure vital signs reviewed and stable Respiratory status: spontaneous breathing Cardiovascular status: blood pressure returned to baseline Postop Assessment: no apparent nausea or vomiting Anesthetic complications: no   No notable events documented.  Last Vitals:  Vitals:   03/25/24 1512 03/25/24 1515  BP:  132/78  Pulse: 84 84  Resp: (!) 7 (!) 9  Temp:  (!) 36.1 C  SpO2: 94% 97%    Last Pain:  Vitals:   03/25/24 1515  TempSrc:   PainSc: Asleep                 Lauraine KATHEE [place]

## 2024-03-25 NOTE — Discharge Instructions (Signed)
 ? ?Dr. Redell Swinteck ?Joint Replacement Specialist ?[facility] ?[address] ?[place], [place] [address] ?[phone] ? ? ?TOTAL HIP REPLACEMENT POSTOPERATIVE DIRECTIONS ? ? ? ?Hip Rehabilitation, Guidelines Following Surgery  ? ?WEIGHT BEARING ?Weight bearing as tolerated with assist device (walker, cane, etc) as directed, use it as long as suggested by your surgeon or therapist, typically at least 4-6 weeks. ? ?The results of a hip operation are greatly improved after range of motion and muscle strengthening exercises. Follow all safety measures which are given to protect your hip. If any of these exercises cause increased pain or swelling in your joint, decrease the amount until you are comfortable again. Then slowly increase the exercises. Call your caregiver if you have problems or questions.  ? ?HOME CARE INSTRUCTIONS  ?Most of the following instructions are designed to prevent the dislocation of your new hip.  ?Remove items at home which could result in a fall. This includes throw rugs or furniture in walking pathways.  ?Continue medications as instructed at time of discharge. ?You may have some home medications which will be placed on hold until you complete the course of blood thinner medication. ?You may start showering once you are discharged home. Do not remove your dressing. ?Do not put on socks or shoes without following the instructions of your caregivers.   ?Sit on chairs with arms. Use the chair arms to help push yourself up when arising.  ?Arrange for the use of a toilet seat elevator so you are not sitting low.  ?Walk with walker as instructed.  ?You may resume a sexual relationship in one month or when given the OK by your caregiver.  ?Use walker as long as suggested by your caregivers.  ?You may put full weight on your legs and walk as much as is comfortable. ?Avoid periods of inactivity such as sitting longer than an hour when not asleep. This helps prevent blood  clots.  ?You may return to work once you are cleared by Designer, industrial/product.  ?Do not drive a car for 6 weeks or until released by your surgeon.  ?Do not drive while taking narcotics.  ?Wear elastic stockings for two weeks following surgery during the day but you may remove then at night.  ?Make sure you keep all of your appointments after your operation with all of your doctors and caregivers. You should call the office at the above phone number and make an appointment for approximately two weeks after the date of your surgery. ?Please pick up a stool softener and laxative for home use as long as you are requiring pain medications. ?ICE to the affected hip every three hours for 30 minutes at a time and then as needed for pain and swelling. Continue to use ice on the hip for pain and swelling from surgery. You may notice swelling that will progress down to the foot and ankle.  This is normal after surgery.  Elevate the leg when you are not up walking on it.   ?It is important for you to complete the blood thinner medication as prescribed by your doctor. ?Continue to use the breathing machine which will help keep your temperature down.  It is common for your temperature to cycle up and down following surgery, especially at night when you are not up moving around and exerting yourself.  The breathing machine keeps your lungs expanded and your temperature down. ? ?RANGE OF MOTION AND STRENGTHENING EXERCISES  ?These exercises are designed to help you  keep full movement of your hip joint. Follow your caregiver's or physical therapist's instructions. Perform all exercises about fifteen times, three times per day or as directed. Exercise both hips, even if you have had only one joint replacement. These exercises can be done on a training (exercise) mat, on the floor, on a table or on a bed. Use whatever works the best and is most comfortable for you. Use music or television while you are exercising so that the exercises are a  pleasant break in your day. This will make your life better with the exercises acting as a break in routine you can look forward to.  ?Lying on your back, slowly slide your foot toward your buttocks, raising your knee up off the floor. Then slowly slide your foot back down until your leg is straight again.  ?Lying on your back spread your legs as far apart as you can without causing discomfort.  ?Lying on your side, raise your upper leg and foot straight up from the floor as far as is comfortable. Slowly lower the leg and repeat.  ?Lying on your back, tighten up the muscle in the front of your thigh (quadriceps muscles). You can do this by keeping your leg straight and trying to raise your heel off the floor. This helps strengthen the largest muscle supporting your knee.  ?Lying on your back, tighten up the muscles of your buttocks both with the legs straight and with the knee bent at a comfortable angle while keeping your heel on the floor.  ? ?SKILLED REHAB INSTRUCTIONS: ?If the patient is transferred to a skilled rehab facility following release from the hospital, a list of the current medications will be sent to the facility for the patient to continue.  When discharged from the skilled rehab facility, please have the facility set up the patient's Home Health Physical Therapy prior to being released. Also, the skilled facility will be responsible for providing the patient with their medications at time of release from the facility to include their pain medication and their blood thinner medication. If the patient is still at the rehab facility at time of the two week follow up appointment, the skilled rehab facility will also need to assist the patient in arranging follow up appointment in our office and any transportation needs. ? ?POST-OPERATIVE OPIOID TAPER INSTRUCTIONS: ?It is important to wean off of your opioid medication as soon as possible. If you do not need pain medication after your surgery it is ok  to stop day one. ?Opioids include: ?Codeine, Hydrocodone (Norco, Vicodin), Oxycodone (Percocet, oxycontin ) and hydromorphone amongst others.  ?Long term and even short term use of opiods can cause: ?Increased pain response ?Dependence ?Constipation ?Depression ?Respiratory depression ?And more.  ?Withdrawal symptoms can include ?Flu like symptoms ?Nausea, vomiting ?And more ?Techniques to manage these symptoms ?Hydrate well ?Eat regular healthy meals ?Stay active ?Use relaxation techniques(deep breathing, meditating, yoga) ?Do Not substitute Alcohol  to help with tapering ?If you have been on opioids for less than two weeks and do not have pain than it is ok to stop all together.  ?Plan to wean off of opioids ?This plan should start within one week post op of your joint replacement. ?Maintain the same interval or time between taking each dose and first decrease the dose.  ?Cut the total daily intake of opioids by one tablet each day ?Next start to increase the time between doses. ?The last dose that should be eliminated is the evening dose.  ? ? ?MAKE  SURE YOU:  ?Understand these instructions.  ?Will watch your condition.  ?Will get help right away if you are not doing well or get worse. ? ?Pick up stool softner and laxative for home use following surgery while on pain medications. ?Do not remove your dressing. ?The dressing is waterproof--it is OK to take showers. ?Continue to use ice for pain and swelling after surgery. ?Do not use any lotions or creams on the incision until instructed by your surgeon. ?Total Hip Protocol. ? ?

## 2024-03-25 NOTE — Anesthesia Procedure Notes (Signed)
 Procedure Name: Intubation Date/Time: 03/25/2024 11:40 AM  Performed by: Buster Catheryn SAUNDERS, CRNAPre-anesthesia Checklist: Patient identified, Emergency Drugs available, Suction available and Patient being monitored Patient Re-evaluated:Patient Re-evaluated prior to induction Oxygen Delivery Method: Circle system utilized Preoxygenation: Pre-oxygenation with 100% oxygen Induction Type: IV induction Ventilation: Mask ventilation without difficulty Laryngoscope Size: Miller and 2 Grade View: Grade II Tube type: Oral Tube size: 7.0 mm Number of attempts: 1 Airway Equipment and Method: Stylet and Oral airway Placement Confirmation: ETT inserted through vocal cords under direct vision, positive ETCO2 and breath sounds checked- equal and bilateral Secured at: 22 cm Tube secured with: Tape Dental Injury: Teeth and Oropharynx as per pre-operative assessment

## 2024-03-25 NOTE — Transfer of Care (Signed)
 Immediate Anesthesia Transfer of Care Note  Patient: [name]  Procedure(s) Performed: ARTHROPLASTY, HIP, TOTAL, ANTERIOR APPROACH (Right: Hip)  Patient Location: PACU  Anesthesia Type:General  Level of Consciousness: awake, alert , and oriented  Airway & Oxygen Therapy: Patient Spontanous Breathing and Patient connected to face mask oxygen  Post-op Assessment: Report given to RN and Post -op Vital signs reviewed and stable  Post vital signs: Reviewed and stable  Last Vitals:  Vitals Value Taken Time  BP 136/111 03/25/24 14:07  Temp    Pulse 87 03/25/24 14:14  Resp 19 03/25/24 14:14  SpO2 100 % 03/25/24 14:14  Vitals shown include unfiled device data.  Last Pain:  Vitals:   03/25/24 0859  TempSrc:   PainSc: 0-No pain         Complications: No notable events documented.

## 2024-03-25 NOTE — Care Plan (Signed)
 Ortho Bundle Case Management Note  Patient Details  Name: [name] MRN: [id number] Date of Birth: June 20, 1970  R THA on 03/25/24  DCP: Home with son  DME: No needs  PT: HEP                   DME Arranged:  N/A DME Agency:  NA  HH Arranged:    HH Agency:     Additional Comments: Please contact me with any questions of if this plan should need to change.  Lyle Pepper, CCM  [facility] [phone]   03/25/2024, 10:26 AM

## 2024-03-25 NOTE — Plan of Care (Signed)
  Problem: Education: Goal: Knowledge of General Education information will improve Description: Including pain rating scale, medication(s)/side effects and non-pharmacologic comfort measures Outcome: Progressing   Problem: Activity: Goal: Risk for activity intolerance will decrease Outcome: Progressing   Problem: Pain Managment: Goal: General experience of comfort will improve and/or be controlled Outcome: Progressing

## 2024-03-25 NOTE — Interval H&P Note (Signed)
 History and Physical Interval Note:  03/25/2024 10:57 AM  [name]  has presented today for surgery, with the diagnosis of Right hip osteoarthritis.  The various methods of treatment have been discussed with the patient and family. After consideration of risks, benefits and other options for treatment, the patient has consented to  Procedure(s): ARTHROPLASTY, HIP, TOTAL, ANTERIOR APPROACH (Right) as a surgical intervention.  The patient's history has been reviewed, patient examined, no change in status, stable for surgery.  I have reviewed the patient's chart and labs.  Questions were answered to the patient's satisfaction.    The risks, benefits, and alternatives were discussed with the patient. There are risks associated with the surgery including, but not limited to, problems with anesthesia (death), infection, instability (giving out of the joint), dislocation, differences in leg length/angulation/rotation, fracture of bones, loosening or failure of implants, hematoma (blood accumulation) which may require surgical drainage, blood clots, pulmonary embolism, nerve injury (foot drop and lateral thigh numbness), and blood vessel injury. The patient understands these risks and elects to proceed.    [name]

## 2024-03-25 NOTE — OR Nursing (Signed)
 Patient prediabetic. CBG on arrival to [facility]. Patient states he does not feel like his blood sugar is low. Denies weakness, sweating, HA, shakiness. Per Anesthesia will not initiate hypoglycemia at this time. Patient aware to let RN know if symptoms change.

## 2024-03-25 NOTE — Op Note (Signed)
 OPERATIVE REPORT  SURGEON: Redell Shoals, MD   ASSISTANT: Valery Potters, PA-C.  PREOPERATIVE DIAGNOSIS: Right hip avascular necrosis.   POSTOPERATIVE DIAGNOSIS: Right hip avascular necrosis.   PROCEDURE: Right total hip arthroplasty, anterior approach.   IMPLANTS: Biomet Taperloc Reduced Distal stem, size 17 x 154 mm, high offset. Biomet G7 OsseoTi Cup, size 56 mm. Biomet Vivacit-E liner, size 36 mm, F, neutral. Biomet Biolox ceramic head ball, size 36 + 0 mm.  ANESTHESIA:  General  ESTIMATED BLOOD LOSS:-300 mL    ANTIBIOTICS: 2g Ancef . 1g Vancomycin .  DRAINS: None.  COMPLICATIONS: None.   CONDITION: PACU - hemodynamically stable.   BRIEF CLINICAL NOTE: [name] is a 53 y.o. male with a long-standing history of Right hip avascular necrosis. After failing conservative management, the patient was indicated for total hip arthroplasty. The risks, benefits, and alternatives to the procedure were explained, and the patient elected to proceed.  PROCEDURE IN DETAIL: Surgical site was marked by myself in the pre-op holding area. Once inside the operating room, spinal anesthesia was obtained, and a foley catheter was inserted. The patient was then positioned on the Hana table.  All bony prominences were well padded.  The hip was prepped and draped in the normal sterile surgical fashion.  A time-out was called verifying side and site of surgery. The patient received IV antibiotics within 60 minutes of beginning the procedure.   Bikini incision was made, and superficial dissection was performed lateral to the ASIS. The direct anterior approach to the hip was performed through the Hueter interval.  Lateral femoral circumflex vessels were treated with the Auqumantys. The anterior capsule was exposed and an inverted T capsulotomy was made. The femoral neck cut was made to the level of the templated cut.  A corkscrew was placed into the head and the head was removed.  The femoral head  was found to have eburnated bone. The head was passed to the back table and was measured. Pubofemoral ligament was released off of the calcar, taking care to stay on bone. Superior capsule was released from the greater trochanter, taking care to stay lateral to the posterior border of the femoral neck in order to preserve the short external rotators.   Acetabular exposure was achieved, and the pulvinar and labrum were excised. Sequential reaming of the acetabulum was then performed up to a size 55 mm reamer. A 56 mm cup was then opened and impacted into place at approximately 40 degrees of abduction and 20 degrees of anteversion. The final polyethylene liner was impacted into place and acetabular osteophytes were removed.    I then gained femoral exposure taking care to protect the abductors and greater trochanter.  This was performed using standard external rotation, extension, and adduction.  A cookie cutter was used to enter the femoral canal, and then the femoral canal finder was placed.  Sequential broaching was performed up to a size 17.  Calcar planer was used on the femoral neck remnant.  I placed a high offset neck and a trial head ball.  The hip was reduced.  Leg lengths and offset were checked fluoroscopically.  The hip was dislocated and trial components were removed.  The final implants were placed, and the hip was reduced.  Fluoroscopy was used to confirm component position and leg lengths.  At 90 degrees of external rotation and full extension, the hip was stable to an anterior directed force.   The wound was copiously irrigated with Prontosan solution and normal saline using  pule lavage.  Marcaine  solution was injected into the periarticular soft tissue.  The wound was closed in layers using #1 Vicryl and V-Loc for the fascia, 2-0 Vicryl for the subcutaneous fat, 2-0 Monocryl for the deep dermal layer, and + Dermabond for the skin.  Once the glue was fully dried, an Aquacell Ag dressing was  applied.  The patient was transported to the recovery room in stable condition.  Sponge, needle, and instrument counts were correct at the end of the case x2.  The patient tolerated the procedure well and there were no known complications.  Please note that a surgical assistant was a medical necessity for this procedure to perform it in a safe and expeditious manner. Assistant was necessary to provide appropriate retraction of vital neurovascular structures, to prevent femoral fracture, and to allow for anatomic placement of the prosthesis.

## 2024-03-26 ENCOUNTER — Encounter (HOSPITAL_COMMUNITY): Payer: Self-pay | Admitting: Orthopedic Surgery

## 2024-03-26 ENCOUNTER — Ambulatory Visit: Payer: Self-pay | Admitting: Family Medicine

## 2024-03-26 DIAGNOSIS — E1122 Type 2 diabetes mellitus with diabetic chronic kidney disease: Secondary | ICD-10-CM | POA: Diagnosis not present

## 2024-03-26 DIAGNOSIS — M25751 Osteophyte, right hip: Secondary | ICD-10-CM | POA: Diagnosis not present

## 2024-03-26 DIAGNOSIS — Z87891 Personal history of nicotine dependence: Secondary | ICD-10-CM | POA: Diagnosis not present

## 2024-03-26 DIAGNOSIS — N189 Chronic kidney disease, unspecified: Secondary | ICD-10-CM | POA: Diagnosis not present

## 2024-03-26 DIAGNOSIS — I130 Hypertensive heart and chronic kidney disease with heart failure and stage 1 through stage 4 chronic kidney disease, or unspecified chronic kidney disease: Secondary | ICD-10-CM | POA: Diagnosis not present

## 2024-03-26 DIAGNOSIS — I5042 Chronic combined systolic (congestive) and diastolic (congestive) heart failure: Secondary | ICD-10-CM | POA: Diagnosis not present

## 2024-03-26 DIAGNOSIS — M879 Osteonecrosis, unspecified: Secondary | ICD-10-CM | POA: Diagnosis not present

## 2024-03-26 DIAGNOSIS — M1611 Unilateral primary osteoarthritis, right hip: Secondary | ICD-10-CM | POA: Diagnosis not present

## 2024-03-26 DIAGNOSIS — D62 Acute posthemorrhagic anemia: Secondary | ICD-10-CM | POA: Diagnosis not present

## 2024-03-26 LAB — CBC
HCT: 33.1 % — ABNORMAL LOW (ref 39.0–52.0)
Hemoglobin: 9.8 g/dL — ABNORMAL LOW (ref 13.0–17.0)
MCH: 28.9 pg (ref 26.0–34.0)
MCHC: 29.6 g/dL — ABNORMAL LOW (ref 30.0–36.0)
MCV: 97.6 fL (ref 80.0–100.0)
Platelets: 232 K/uL (ref 150–400)
RBC: 3.39 MIL/uL — ABNORMAL LOW (ref 4.22–5.81)
RDW: 13.5 % (ref 11.5–15.5)
WBC: 5.5 K/uL (ref 4.0–10.5)
nRBC: 0 % (ref 0.0–0.2)

## 2024-03-26 LAB — BASIC METABOLIC PANEL WITH GFR
Anion gap: 9 (ref 5–15)
Anion gap: 9 (ref 5–15)
BUN: 24 mg/dL — ABNORMAL HIGH (ref 6–20)
BUN: 26 mg/dL — ABNORMAL HIGH (ref 6–20)
CO2: 24 mmol/L (ref 22–32)
CO2: 27 mmol/L (ref 22–32)
Calcium: 8.1 mg/dL — ABNORMAL LOW (ref 8.9–10.3)
Calcium: 8.9 mg/dL (ref 8.9–10.3)
Chloride: 101 mmol/L (ref 98–111)
Chloride: 103 mmol/L (ref 98–111)
Creatinine, Ser: 2.3 mg/dL — ABNORMAL HIGH (ref 0.61–1.24)
Creatinine, Ser: 2.25 mg/dL — ABNORMAL HIGH (ref 0.61–1.24)
GFR, Estimated: 34 mL/min — ABNORMAL LOW (ref >60)
GFR, Estimated: 35 mL/min — ABNORMAL LOW (ref >60)
Glucose, Bld: 388 mg/dL — ABNORMAL HIGH (ref 70–99)
Glucose, Bld: 138 mg/dL — ABNORMAL HIGH (ref 70–99)
Potassium: 4.9 mmol/L (ref 3.5–5.1)
Potassium: 5.5 mmol/L — ABNORMAL HIGH (ref 3.5–5.1)
Sodium: 134 mmol/L — ABNORMAL LOW (ref 135–145)
Sodium: 139 mmol/L (ref 135–145)

## 2024-03-26 LAB — GLUCOSE, CAPILLARY
Glucose-Capillary: 146 mg/dL — ABNORMAL HIGH (ref 70–99)
Glucose-Capillary: 196 mg/dL — ABNORMAL HIGH (ref 70–99)
Glucose-Capillary: 176 mg/dL — ABNORMAL HIGH (ref 70–99)

## 2024-03-26 MED ORDER — INSULIN ASPART 100 UNIT/ML IJ SOLN
0.0000 [IU] | Freq: Three times a day (TID) | INTRAMUSCULAR | Status: DC
  Administered 2024-03-26: 2 [IU] via SUBCUTANEOUS
  Administered 2024-03-26 – 2024-03-27 (×2): 1 [IU] via SUBCUTANEOUS

## 2024-03-26 MED ORDER — INSULIN ASPART 100 UNIT/ML IJ SOLN: 0.0000 [IU] | Freq: Every day | INTRAMUSCULAR | Status: DC

## 2024-03-26 NOTE — Progress Notes (Signed)
 Physical Therapy Treatment Patient Details Name: [name] MRN: [id number] DOB: Jul 21, 1970 Today's Date: 03/26/2024   History of Present Illness 53 yo male presents to therapy s/p R THA, anterior approach secondary to failure of conservative measures and avascular necrosis. Pt PMH includes but is not limited to: nocardia infection, AKI, CMV, R hip fx (06/27/2023), anemia, CKD, heart transplant (2024), colon ca, s and d CHF, GERD, chronic pain, substance abuse, cirrhosis of liver, HLD, scoliosis, AICD, depression , DM II, Wilms tumor, and cardiac surgeries.    PT Comments  Pt continues motivated and progressing well with mobility.  Pt performed HEP with written instruction provided and reviewed, up to ambulate increased distance in hall, negotiated stairs and reviewed car transfers.  Pt eager for dc home this date.    If plan is discharge home, recommend the following: A little help with walking and/or transfers;A little help with bathing/dressing/bathroom;Assistance with cooking/housework;Assist for transportation;Help with stairs or ramp for entrance   Can travel by private vehicle        Equipment Recommendations  None recommended by PT    Recommendations for Other Services       Precautions / Restrictions Precautions Precautions: Fall;ICD/Pacemaker Restrictions Weight Bearing Restrictions Per Provider Order: No RLE Weight Bearing Per Provider Order: Weight bearing as tolerated     Mobility  Bed Mobility Overal bed mobility: Needs Assistance Bed Mobility: Supine to Sit     Supine to sit: Supervision     General bed mobility comments: min cues    Transfers Overall transfer level: Needs assistance Equipment used: Rolling walker (2 wheels) Transfers: Sit to/from Stand Sit to Stand: Contact guard assist, Supervision           General transfer comment: min cues for LE management and use of UEs to self assist    Ambulation/Gait Ambulation/Gait assistance:  Contact guard assist, Supervision Gait Distance (Feet): 150 Feet Assistive device: Rolling walker (2 wheels) Gait Pattern/deviations: Step-to pattern, Step-through pattern, Decreased step length - right, Decreased step length - left, Shuffle, Trunk flexed Gait velocity: decreased     General Gait Details: cues for posture and position from RW   Stairs Stairs: Yes Stairs assistance: Contact guard assist Stair Management: No rails, Step to pattern, Forwards, With walker Number of Stairs: 2 General stair comments: single step twice with RW; min cues for sequence   Wheelchair Mobility     Tilt Bed    Modified Rankin (Stroke Patients Only)       Balance Overall balance assessment: Needs assistance Sitting-balance support: Feet supported Sitting balance-Leahy Scale: Good     Standing balance support: No upper extremity supported Standing balance-Leahy Scale: Fair                              Hotel manager: No apparent difficulties  Cognition Arousal: Alert Behavior During Therapy: WFL for tasks assessed/performed   PT - Cognitive impairments: No apparent impairments                         Following commands: Intact      Cueing    Exercises Total Joint Exercises Ankle Circles/Pumps: AROM, Both, 10 reps Quad Sets: AROM, Both, 10 reps, Supine Heel Slides: AAROM, Right, 20 reps, Supine Hip ABduction/ADduction: AAROM, Right, 15 reps, Supine Long Arc Quad: AROM, Right, 10 reps, Supine    General Comments  Pertinent Vitals/Pain Pain Assessment Pain Assessment: 0-10 Pain Score: 5  Pain Location: R hip and LE Pain Descriptors / Indicators: Aching, Constant, Discomfort, Dull, Grimacing, Operative site guarding Pain Intervention(s): Limited activity within patient's tolerance, Monitored during session, Premedicated before session, Ice applied    Home Living                          Prior  Function            PT Goals (current goals can now be found in the care plan section) Acute Rehab PT Goals Patient Stated Goal: to be able to go deer hunting this season PT Goal Formulation: With patient Time For Goal Achievement: 04/08/24 Potential to Achieve Goals: Good Progress towards PT goals: Progressing toward goals    Frequency    7X/week      PT Plan      Co-evaluation              AM-PAC PT 6 Clicks Mobility   Outcome Measure  Help needed turning from your back to your side while in a flat bed without using bedrails?: None Help needed moving from lying on your back to sitting on the side of a flat bed without using bedrails?: A Little Help needed moving to and from a bed to a chair (including a wheelchair)?: A Little Help needed standing up from a chair using your arms (e.g., wheelchair or bedside chair)?: A Little Help needed to walk in hospital room?: A Little Help needed climbing 3-5 steps with a railing? : A Little 6 Click Score: 19    End of Session Equipment Utilized During Treatment: Gait belt Activity Tolerance: Patient tolerated treatment well;No increased pain Patient left: in chair;with call bell/phone within reach;with chair alarm set Nurse Communication: Mobility status PT Visit Diagnosis: Unsteadiness on feet (R26.81);Other abnormalities of gait and mobility (R26.89);Muscle weakness (generalized) (M62.81);Difficulty in walking, not elsewhere classified (R26.2);Pain Pain - Right/Left: Right Pain - part of body: Hip;Leg     Time: 9076-9042 PT Time Calculation (min) (ACUTE ONLY): 34 min  Charges:    $Gait Training: 8-22 mins $Therapeutic Exercise: 8-22 mins PT General Charges $$ ACUTE PT VISIT: 1 Visit                     [facility] PT Acute Rehabilitation Services Office [phone]    Niley Helbig 03/26/2024, 12:14 PM

## 2024-03-26 NOTE — TOC Transition Note (Signed)
 Transition of Care [facility]) - Discharge Note   Patient Details  Name: [name] MRN: [id number] Date of Birth: Jan 05, 1971  Transition of Care [facility]) CM/SW Contact:  Alfonse JONELLE Rex, RN Phone Number: 03/26/2024, 11:01 AM   Clinical Narrative:   Met with patient at bedside to review dc therapy and home equipment needs, patient confirmed HEP, has RW, no home DME needs. No TOC needs.     Final next level of care: Home/Self Care Barriers to Discharge: No Barriers Identified   Patient Goals and CMS Choice Patient states their goals for this hospitalization and ongoing recovery are:: return home          Discharge Placement                       Discharge Plan and Services Additional resources added to the After Visit Summary for                  DME Arranged: N/A DME Agency: NA                  Social Drivers of Health (SDOH) Interventions SDOH Screenings   Food Insecurity: No Food Insecurity (03/25/2024)  Housing: Low Risk  (03/25/2024)  Transportation Needs: No Transportation Needs (03/25/2024)  Utilities: Not At Risk (03/25/2024)  Alcohol Screen: Low Risk  (10/09/2023)  Depression (PHQ2-9): Low Risk  (03/10/2024)  Financial Resource Strain: Low Risk  (11/21/2023)   Received from [facility]  Physical Activity: Inactive (10/09/2023)  Social Connections: Moderately Isolated (10/09/2023)  Stress: No Stress Concern Present (10/09/2023)  Tobacco Use: Medium Risk (03/25/2024)  Health Literacy: Adequate Health Literacy (10/09/2023)     Readmission Risk Interventions     No data to display

## 2024-03-26 NOTE — Plan of Care (Signed)
  Problem: Education: Goal: Knowledge of General Education information will improve Description: Including pain rating scale, medication(s)/side effects and non-pharmacologic comfort measures Outcome: Progressing   Problem: Activity: Goal: Risk for activity intolerance will decrease Outcome: Progressing   Problem: Nutrition: Goal: Adequate nutrition will be maintained Outcome: Progressing   Problem: Elimination: Goal: Will not experience complications related to urinary retention Outcome: Progressing   Problem: Pain Managment: Goal: General experience of comfort will improve and/or be controlled Outcome: Progressing

## 2024-03-26 NOTE — Progress Notes (Addendum)
    Subjective:  Patient reports pain as mild.  Denies N/V/CP/SOB/Abd pain. Denies tingling or numbness in LE bilaterally. He reports mild pain in his leg.  Eager for d/c home this date. Discussed parameters to d/c home.  Continue PO fluids for creatinine trend.   Objective:   VITALS:   Vitals:   03/25/24 1741 03/25/24 2025 03/26/24 0133 03/26/24 0616  BP: (!) 163/96 131/79 136/89 124/79  Pulse: 86 87 84 82  Resp: 16 18 18 18   Temp: 98.3 F (36.8 C) 98 F (36.7 C) 98.3 F (36.8 C) 98.1 F (36.7 C)  TempSrc: Oral Oral Oral Oral  SpO2: 100% 99% 99% 98%  Weight:      Height:        NAD Neurologically intact ABD soft Neurovascular intact Sensation intact distally Intact pulses distally Dorsiflexion/Plantar flexion intact Incision: dressing C/D/I No cellulitis present Compartment soft   Lab Results  Component Value Date   WBC 5.5 03/26/2024   HGB 9.8 (L) 03/26/2024   HCT 33.1 (L) 03/26/2024   MCV 97.6 03/26/2024   PLT 232 03/26/2024   BMET    Component Value Date/Time   NA 134 (L) 03/26/2024 0334   NA 128 (L) 12/05/2018 1035   K 4.9 03/26/2024 0334   CL 101 03/26/2024 0334   CO2 24 03/26/2024 0334   GLUCOSE 388 (H) 03/26/2024 0334   BUN 24 (H) 03/26/2024 0334   BUN 30 (H) 12/05/2018 1035   CREATININE 2.30 (H) 03/26/2024 0334   CREATININE 3.12 (H) 09/09/2023 1131   CALCIUM  8.1 (L) 03/26/2024 0334   EGFR 31.0 01/02/2024 0000   EGFR 54 (L) 06/15/2022 0815   GFRNONAA 34 (L) 03/26/2024 0334   GFRNONAA 79 03/05/2018 1528     Assessment/Plan: 1 Day Post-Op   Principal Problem:   Avascular necrosis of hip, right (HCC)  ABLA. Hemoglobin 98. Continue to monitor.  Creatinine 2.30 (1.99 baseline). Continue PO fluids. Recheck at 2:00pm. Continue to monitor.   WBAT with walker DVT ppx: Aspirin , SCDs, TEDS PO pain control PT/OT: He ambulated 75 feet with PT. Continue PT today.  Dispo:  - D/c home with HEP.  - Pending recheck BMP at 2:00 pm today with  downward trend, and PT clearance.    Valery GORMAN Potters 03/26/2024, 7:50 AM   [facility]  [place] [address], [address], [place], [place] [address] Phone: [phone] [email or web address] Facebook  [employer]

## 2024-03-27 ENCOUNTER — Other Ambulatory Visit (HOSPITAL_COMMUNITY): Payer: Self-pay

## 2024-03-27 ENCOUNTER — Telehealth (HOSPITAL_COMMUNITY): Payer: Self-pay | Admitting: Pharmacy Technician

## 2024-03-27 DIAGNOSIS — E1122 Type 2 diabetes mellitus with diabetic chronic kidney disease: Secondary | ICD-10-CM | POA: Diagnosis not present

## 2024-03-27 DIAGNOSIS — I130 Hypertensive heart and chronic kidney disease with heart failure and stage 1 through stage 4 chronic kidney disease, or unspecified chronic kidney disease: Secondary | ICD-10-CM | POA: Diagnosis not present

## 2024-03-27 DIAGNOSIS — N189 Chronic kidney disease, unspecified: Secondary | ICD-10-CM | POA: Diagnosis not present

## 2024-03-27 DIAGNOSIS — I5042 Chronic combined systolic (congestive) and diastolic (congestive) heart failure: Secondary | ICD-10-CM | POA: Diagnosis not present

## 2024-03-27 DIAGNOSIS — D62 Acute posthemorrhagic anemia: Secondary | ICD-10-CM | POA: Diagnosis not present

## 2024-03-27 DIAGNOSIS — Z87891 Personal history of nicotine dependence: Secondary | ICD-10-CM | POA: Diagnosis not present

## 2024-03-27 DIAGNOSIS — M25751 Osteophyte, right hip: Secondary | ICD-10-CM | POA: Diagnosis not present

## 2024-03-27 DIAGNOSIS — M1611 Unilateral primary osteoarthritis, right hip: Secondary | ICD-10-CM | POA: Diagnosis not present

## 2024-03-27 DIAGNOSIS — M879 Osteonecrosis, unspecified: Secondary | ICD-10-CM | POA: Diagnosis not present

## 2024-03-27 LAB — CBC
HCT: 33.3 % — ABNORMAL LOW (ref 39.0–52.0)
Hemoglobin: 10 g/dL — ABNORMAL LOW (ref 13.0–17.0)
MCH: 29.4 pg (ref 26.0–34.0)
MCHC: 30 g/dL (ref 30.0–36.0)
MCV: 97.9 fL (ref 80.0–100.0)
Platelets: 222 K/uL (ref 150–400)
RBC: 3.4 MIL/uL — ABNORMAL LOW (ref 4.22–5.81)
RDW: 13.7 % (ref 11.5–15.5)
WBC: 3.8 K/uL — ABNORMAL LOW (ref 4.0–10.5)
nRBC: 0 % (ref 0.0–0.2)

## 2024-03-27 LAB — BASIC METABOLIC PANEL WITH GFR
Anion gap: 8 (ref 5–15)
BUN: 24 mg/dL — ABNORMAL HIGH (ref 6–20)
CO2: 27 mmol/L (ref 22–32)
Calcium: 8.5 mg/dL — ABNORMAL LOW (ref 8.9–10.3)
Chloride: 106 mmol/L (ref 98–111)
Creatinine, Ser: 2.02 mg/dL — ABNORMAL HIGH (ref 0.61–1.24)
GFR, Estimated: 39 mL/min — ABNORMAL LOW (ref >60)
Glucose, Bld: 151 mg/dL — ABNORMAL HIGH (ref 70–99)
Potassium: 4.1 mmol/L (ref 3.5–5.1)
Sodium: 140 mmol/L (ref 135–145)

## 2024-03-27 LAB — GLUCOSE, CAPILLARY: Glucose-Capillary: 149 mg/dL — ABNORMAL HIGH (ref 70–99)

## 2024-03-27 MED ORDER — ONDANSETRON HCL 4 MG PO TABS
4.0000 mg | ORAL_TABLET | Freq: Three times a day (TID) | ORAL | 0 refills | Status: AC | PRN
  Filled 2024-03-27: qty 30, 10d supply, fill #0

## 2024-03-27 MED ORDER — DOCUSATE SODIUM 100 MG PO CAPS
100.0000 mg | ORAL_CAPSULE | Freq: Two times a day (BID) | ORAL | 0 refills | Status: AC
  Filled 2024-03-27: qty 60, 30d supply, fill #0

## 2024-03-27 MED ORDER — SENNA 8.6 MG PO TABS
2.0000 | ORAL_TABLET | Freq: Every day | ORAL | 0 refills | Status: AC
  Filled 2024-03-27: qty 30, 15d supply, fill #0

## 2024-03-27 MED ORDER — POLYETHYLENE GLYCOL 3350 17 GM/SCOOP PO POWD
17.0000 g | Freq: Every day | ORAL | 0 refills | Status: AC | PRN
  Filled 2024-03-27: qty 238, 14d supply, fill #0

## 2024-03-27 MED ORDER — NYSTATIN 100000 UNIT/GM EX POWD
1.0000 | Freq: Two times a day (BID) | CUTANEOUS | 1 refills | Status: AC
  Filled 2024-03-27: qty 60, 30d supply, fill #0

## 2024-03-27 MED ORDER — ASPIRIN 81 MG PO CHEW
81.0000 mg | CHEWABLE_TABLET | Freq: Two times a day (BID) | ORAL | 0 refills | Status: AC
  Filled 2024-03-27: qty 90, 45d supply, fill #0

## 2024-03-27 MED ORDER — METHOCARBAMOL 500 MG PO TABS
500.0000 mg | ORAL_TABLET | Freq: Four times a day (QID) | ORAL | 0 refills | Status: AC | PRN
  Filled 2024-03-27: qty 20, 5d supply, fill #0

## 2024-03-27 MED FILL — Hydrocodone-Acetaminophen Tab 5-325 MG: 1.0000 | ORAL | 7 days supply | Qty: 42 | Fill #0 | Status: AC

## 2024-03-27 NOTE — Progress Notes (Signed)
 Discharge instructions given to patient questions asked and answered.

## 2024-03-27 NOTE — Telephone Encounter (Signed)
 Pharmacy Patient Advocate Encounter   Received notification from Inpatient Request that prior authorization for Nystatin Powder is required/requested.   Insurance verification completed.   The patient is insured through [place].   Per test claim: PA required; PA submitted to above mentioned insurance via Latent Key/confirmation #/EOC [id number] Status is pending

## 2024-03-27 NOTE — Plan of Care (Signed)
   Problem: Coping: Goal: Level of anxiety will decrease Outcome: Progressing   Problem: Pain Managment: Goal: General experience of comfort will improve and/or be controlled Outcome: Progressing   Problem: Safety: Goal: Ability to remain free from injury will improve Outcome: Progressing

## 2024-03-27 NOTE — Progress Notes (Signed)
 Physical Therapy Treatment Patient Details Name: [name] MRN: [id number] DOB: 04-21-1971 Today's Date: 03/27/2024   History of Present Illness 53 yo male presents to therapy s/p R THA, anterior approach secondary to failure of conservative measures and avascular necrosis. Pt PMH includes but is not limited to: nocardia infection, AKI, CMV, R hip fx (06/27/2023), anemia, CKD, heart transplant (2024), colon ca, s and d CHF, GERD, chronic pain, substance abuse, cirrhosis of liver, HLD, scoliosis, AICD, depression , DM II, Wilms tumor, and cardiac surgeries.    PT Comments  Pt motivated and progressing well with mobility including up to ambulate increased distance in hall, negotiated stairs, performed HEP, and performed dressing with SBA only.  Pt eager for dc home this date.   If plan is discharge home, recommend the following: A little help with walking and/or transfers;A little help with bathing/dressing/bathroom;Assistance with cooking/housework;Assist for transportation;Help with stairs or ramp for entrance   Can travel by private vehicle        Equipment Recommendations  None recommended by PT    Recommendations for Other Services       Precautions / Restrictions Precautions Precautions: Fall;ICD/Pacemaker Restrictions Weight Bearing Restrictions Per Provider Order: Yes RLE Weight Bearing Per Provider Order: Weight bearing as tolerated     Mobility  Bed Mobility Overal bed mobility: Needs Assistance Bed Mobility: Supine to Sit     Supine to sit: Supervision     General bed mobility comments: cues for use of gait belt to assist R LE    Transfers Overall transfer level: Needs assistance Equipment used: Rolling walker (2 wheels) Transfers: Sit to/from Stand Sit to Stand: Supervision           General transfer comment: min cues for LE management and use of UEs to self assist    Ambulation/Gait Ambulation/Gait assistance: Contact guard assist,  Supervision Gait Distance (Feet): 200 Feet Assistive device: Rolling walker (2 wheels) Gait Pattern/deviations: Step-to pattern, Step-through pattern, Decreased step length - right, Decreased step length - left, Shuffle, Trunk flexed Gait velocity: decreased     General Gait Details: cues for posture and position from RW   Stairs Stairs: Yes Stairs assistance: Contact guard assist Stair Management: Two rails, Forwards, Step to pattern Number of Stairs: 3 General stair comments: min cues for sequence   Wheelchair Mobility     Tilt Bed    Modified Rankin (Stroke Patients Only)       Balance Overall balance assessment: Needs assistance Sitting-balance support: Feet supported Sitting balance-Leahy Scale: Good     Standing balance support: No upper extremity supported Standing balance-Leahy Scale: Fair                              Hotel manager: No apparent difficulties  Cognition Arousal: Alert Behavior During Therapy: WFL for tasks assessed/performed   PT - Cognitive impairments: No apparent impairments                         Following commands: Intact      Cueing    Exercises Total Joint Exercises Ankle Circles/Pumps: AROM, Both, 10 reps Quad Sets: AROM, Both, 10 reps, Supine Heel Slides: AAROM, Right, 20 reps, Supine Long Arc Quad: AROM, Right, 10 reps, Supine    General Comments        Pertinent Vitals/Pain Pain Assessment Pain Assessment: 0-10 Pain Score: 5  Pain Location: R hip and  LE Pain Descriptors / Indicators: Aching, Constant, Discomfort, Dull, Grimacing, Operative site guarding Pain Intervention(s): Limited activity within patient's tolerance, Monitored during session, Premedicated before session, Ice applied    Home Living                          Prior Function            PT Goals (current goals can now be found in the care plan section) Acute Rehab PT Goals Patient  Stated Goal: to be able to go deer hunting this season PT Goal Formulation: With patient Time For Goal Achievement: 04/08/24 Potential to Achieve Goals: Good Progress towards PT goals: Progressing toward goals    Frequency    7X/week      PT Plan      Co-evaluation              AM-PAC PT 6 Clicks Mobility   Outcome Measure  Help needed turning from your back to your side while in a flat bed without using bedrails?: None Help needed moving from lying on your back to sitting on the side of a flat bed without using bedrails?: A Little Help needed moving to and from a bed to a chair (including a wheelchair)?: A Little Help needed standing up from a chair using your arms (e.g., wheelchair or bedside chair)?: A Little Help needed to walk in hospital room?: A Little Help needed climbing 3-5 steps with a railing? : A Little 6 Click Score: 19    End of Session Equipment Utilized During Treatment: Gait belt Activity Tolerance: Patient tolerated treatment well;No increased pain Patient left: in chair;with call bell/phone within reach;with chair alarm set Nurse Communication: Mobility status PT Visit Diagnosis: Unsteadiness on feet (R26.81);Other abnormalities of gait and mobility (R26.89);Muscle weakness (generalized) (M62.81);Difficulty in walking, not elsewhere classified (R26.2);Pain Pain - Right/Left: Right Pain - part of body: Hip;Leg     Time: 9177-9152 PT Time Calculation (min) (ACUTE ONLY): 25 min  Charges:    $Gait Training: 8-22 mins $Therapeutic Exercise: 8-22 mins PT General Charges $$ ACUTE PT VISIT: 1 Visit                     [facility] PT Acute Rehabilitation Services Office [phone]    Saretta Dahlem 03/27/2024, 10:48 AM

## 2024-03-27 NOTE — Telephone Encounter (Signed)
 Pharmacy Patient Advocate Encounter  Received notification from [employer] that Prior Authorization for Nystatin Powder has been APPROVED from 03/27/2024 to 06/10/2025   PA #/Case ID/Reference #: [id number]

## 2024-03-30 NOTE — Progress Notes (Signed)
    Subjective:  Patient reports pain as mild.  Denies N/V/CP/SOB/Abd pain. Reports mild thigh soreness.  Eager for d/c home this date.  Voiding without difficulty.  States he got some good sleep overnight.   Objective:   VITALS:   Vitals:   03/26/24 0921 03/26/24 1228 03/26/24 2115 03/27/24 0646  BP: (!) 151/102 (!) 154/91 (!) 149/93 123/83  Pulse: 86 81 89 96  Resp: 18 16 15 18   Temp: 97.6 F (36.4 C) 98.3 F (36.8 C) 97.7 F (36.5 C) 99.7 F (37.6 C)  TempSrc:   Oral Oral  SpO2: 99% 99% 98% 99%  Weight:      Height:        NAD Neurologically intact ABD soft Neurovascular intact Sensation intact distally Intact pulses distally Dorsiflexion/Plantar flexion intact Incision: dressing C/D/I No cellulitis present Compartment soft   Lab Results  Component Value Date   WBC 3.8 (L) 03/27/2024   HGB 10.0 (L) 03/27/2024   HCT 33.3 (L) 03/27/2024   MCV 97.9 03/27/2024   PLT 222 03/27/2024   BMET    Component Value Date/Time   NA 140 03/27/2024 0326   NA 128 (L) 12/05/2018 1035   K 4.1 03/27/2024 0326   CL 106 03/27/2024 0326   CO2 27 03/27/2024 0326   GLUCOSE 151 (H) 03/27/2024 0326   BUN 24 (H) 03/27/2024 0326   BUN 30 (H) 12/05/2018 1035   CREATININE 2.02 (H) 03/27/2024 0326   CREATININE 3.12 (H) 09/09/2023 1131   CALCIUM  8.5 (L) 03/27/2024 0326   EGFR 31.0 01/02/2024 0000   EGFR 54 (L) 06/15/2022 0815   GFRNONAA 39 (L) 03/27/2024 0326   GFRNONAA 79 03/05/2018 1528     Assessment/Plan: 5 Days Post-Op   Principal Problem:   Avascular necrosis of hip, right (HCC)  ABLA. Hemoglobin 10.0. Continue to monitor. Asymptomatic.  CKD. Creatinine 2.02 today (1.99 baseline) improved since yesterday. Continue to monitor. Encouraged PO fluids.   WBAT with walker DVT ppx: Aspirin , SCDs, TEDS PO pain control PT/OT: He ambulated 150 feet with PT yesterday. Continue PT today.  Dispo:  - D/c home with HEP.    [name] [name] 03/30/2024, 1:44  PM   [facility]  [place] [address], [place], [place] [address] Phone: [phone] [email or web address] Facebook  [employer]

## 2024-03-30 NOTE — Discharge Summary (Signed)
 Physician Discharge Summary  Patient ID: [name] MRN: [id number] DOB/AGE: [birth year] 51 y.o.  Admit date: 03/25/2024 Discharge date: 03/27/2024  Admission Diagnoses:  Avascular necrosis of hip, right [facility], [place])  Discharge Diagnoses:  Principal Problem:   Avascular necrosis of hip, right (HCC)   Past Medical History:  Diagnosis Date   AICD (automatic cardioverter/defibrillator) present    Arthritis    Cardiomyopathy, nonischemic (HCC)    takes Digoxin  daily and Carvedilol  daily   CHF (congestive heart failure) (HCC)    takes Lasix  daily as well Aldactone    Chronic back pain    Chronic systolic heart failure (HCC)    Cirrhosis (HCC)    Depression    takes Prozac  daily   Diabetes mellitus without complication (HCC)    preDM   GERD (gastroesophageal reflux disease)    takes Omeprazole  daily   Hx of Alcohol consumption heavy    rare beer currently   hx of Tobacco abuse    quit smoking 2014   Hyperlipidemia    was on Simvastatin  but has been off a year   Hypertension    Insomnia    takes Ambien  as needed(just got script yesterday)   Orthostatic hypotension    Scoliosis    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 male who was admitted 03/25/2024 with a diagnosis of Avascular necrosis of hip, right (HCC) and went to the operating room on 03/25/2024 and underwent the above named procedures.    He was given perioperative antibiotics:  Anti-infectives (From admission, onward)    Start     Dose/Rate Route Frequency Ordered Stop   03/26/24 1000  azithromycin  (ZITHROMAX ) tablet 500 mg  Status:  Discontinued        500 mg Oral Daily 03/25/24 1557 03/27/24 1552   03/25/24 2000  atovaquone (MEPRON) 750 MG/5ML suspension 750 mg  Status:  Discontinued        750 mg Oral Daily  03/25/24 1557 03/27/24 1552   03/25/24 1800  ceFAZolin  (ANCEF ) IVPB 2g/100 mL premix        2 g 200 mL/hr over 30 Minutes Intravenous Every 6 hours 03/25/24 1557 03/26/24 0041   03/25/24 1340  vancomycin  (VANCOCIN ) powder  Status:  Discontinued          As needed 03/25/24 1227 03/25/24 1404   03/25/24 0900  ceFAZolin  (ANCEF ) IVPB 2g/100 mL premix        2 g 200 mL/hr over 30 Minutes Intravenous On call to O.R. 03/25/24 0852 03/25/24 1152       He was given sequential compression devices, early ambulation, and aspirin  for DVT prophylaxis.  POD#1 Patient has elevated creatinine 1.99 baseline, 2.30 today. Continue PO fluids afternoon recheck 2.25, restarted IV fluids. HE ambulated 150 feet with PT. ABLA hemoglobin 9.8, continue to monitor. POD#1 Patient's creatinine improved 2.02, back to baseline. He ambulated 200 feet with PT. ABLA. Hemoglobin 10.0. asymptomatic. D/c home with HEP. F/u for 2 week PO visit.   He benefited maximally from the hospital stay and there were no complications.    Recent vital signs:  Vitals:   03/26/24 2115 03/27/24 0646  BP: (!) 149/93 123/83  Pulse: 89 96  Resp: 15 18  Temp: 97.7 F (36.5 C) 99.7 F (37.6 C)  SpO2: 98% 99%  Recent laboratory studies:  Lab Results  Component Value Date   HGB 10.0 (L) 03/27/2024   HGB 9.8 (L) 03/26/2024   HGB 13.4 03/12/2024   Lab Results  Component Value Date   WBC 3.8 (L) 03/27/2024   PLT 222 03/27/2024   Lab Results  Component Value Date   INR 1.2 06/14/2023   Lab Results  Component Value Date   NA 140 03/27/2024   K 4.1 03/27/2024   CL 106 03/27/2024   CO2 27 03/27/2024   BUN 24 (H) 03/27/2024   CREATININE 2.02 (H) 03/27/2024   GLUCOSE 151 (H) 03/27/2024     Allergies as of 03/27/2024       Reactions   Ibuprofen Hives   Labetalol  Other (See Comments)   fatigue labetalol    Nsaids    ELEVATED LFT'S        Medication List     STOP taking these medications    aspirin  EC 81 MG  tablet Replaced by: Aspirin  Low Dose 81 MG chewable tablet   oxyCODONE  5 MG immediate release tablet Commonly known as: Oxy IR/ROXICODONE        TAKE these medications    Aspirin  Low Dose 81 MG chewable tablet Generic drug: aspirin  Chew 1 tablet (81 mg total) by mouth 2 (two) times daily with a meal. Replaces: aspirin  EC 81 MG tablet   atovaquone 750 MG/5ML suspension Commonly known as: MEPRON Take 750 mg by mouth daily.   azithromycin  500 MG tablet Commonly known as: ZITHROMAX  Take 500 mg by mouth in the morning.   Blood Glucose Monitoring Suppl Devi 1 each by Does not apply route in the morning, at noon, and at bedtime. May substitute to any manufacturer covered by patient's insurance.   cetirizine  10 MG tablet Commonly known as: ZYRTEC  Take 1 tablet (10 mg total) by mouth daily.   colchicine  0.6 MG tablet Take 2 at first sign of attack And repeat 1 more in 1 hour What changed:  how much to take how to take this when to take this reasons to take this   docusate sodium  100 MG capsule Commonly known as: Colace Take 1 capsule (100 mg total) by mouth 2 (two) times daily.   fluticasone  50 MCG/ACT nasal spray Commonly known as: FLONASE  Place 2 sprays into both nostrils daily. What changed:  when to take this reasons to take this   HYDROcodone -acetaminophen  5-325 MG tablet Commonly known as: NORCO/VICODIN Take 1 tablet by mouth every 4 (four) hours as needed for up to 7 days for moderate pain (pain score 4-6) or severe pain (pain score 7-10).   MAGnesium -Oxide 400 (240 Mg) MG tablet Generic drug: magnesium  oxide Take 400 mg by mouth in the morning.   methocarbamol 500 MG tablet Commonly known as: ROBAXIN Take 1 tablet (500 mg total) by mouth every 6 (six) hours as needed for muscle spasms.   mycophenolate 250 MG capsule Commonly known as: CELLCEPT Take 250 mg by mouth 2 (two) times daily.   nebivolol 10 MG tablet Commonly known as: BYSTOLIC Take 1 tablet  (10 mg total) by mouth daily.   nystatin powder Commonly known as: MYCOSTATIN/NYSTOP Apply 1 Application topically 2 (two) times daily. To groin and other affected areas.   ondansetron  4 MG tablet Commonly known as: Zofran  Take 1 tablet (4 mg total) by mouth every 8 (eight) hours as needed for nausea or vomiting.   pantoprazole  40 MG tablet Commonly known as: PROTONIX  Take 40 mg by mouth daily before breakfast.  polyethylene glycol powder 17 GM/SCOOP powder Commonly known as: GLYCOLAX /MIRALAX  Take 17 g dissolved in liquid by mouth daily as needed for mild constipation or moderate constipation.   predniSONE  2.5 MG tablet Commonly known as: DELTASONE  Take 2.5 mg by mouth daily with breakfast.   rosuvastatin 5 MG tablet Commonly known as: CRESTOR Take 5 mg by mouth every evening.   senna 8.6 MG Tabs tablet Commonly known as: SENOKOT Take 2 tablets (17.2 mg total) by mouth at bedtime for 15 days.   tacrolimus  1 MG capsule Commonly known as: PROGRAF  Take 3 mg by mouth 2 (two) times daily.               Discharge Care Instructions  (From admission, onward)           Start     Ordered   03/27/24 0000  Weight bearing as tolerated        03/27/24 0748   03/27/24 0000  Change dressing       Comments: Do not change your dressing.   03/27/24 0748              WEIGHT BEARING   Weight bearing as tolerated with assist device (walker, cane, etc) as directed, use it as long as suggested by your surgeon or therapist, typically at least 4-6 weeks.   EXERCISES  Results after joint replacement surgery are often greatly improved when you follow the exercise, range of motion and muscle strengthening exercises prescribed by your doctor. Safety measures are also important to protect the joint from further injury. Any time any of these exercises cause you to have increased pain or swelling, decrease what you are doing until you are comfortable again and then slowly  increase them. If you have problems or questions, call your caregiver or physical therapist for advice.   Rehabilitation is important following a joint replacement. After just a few days of immobilization, the muscles of the leg can become weakened and shrink (atrophy).  These exercises are designed to build up the tone and strength of the thigh and leg muscles and to improve motion. Often times heat used for twenty to thirty minutes before working out will loosen up your tissues and help with improving the range of motion but do not use heat for the first two weeks following surgery (sometimes heat can increase post-operative swelling).   These exercises can be done on a training (exercise) mat, on the floor, on a table or on a bed. Use whatever works the best and is most comfortable for you.    Use music or television while you are exercising so that the exercises are a pleasant break in your day. This will make your life better with the exercises acting as a break in your routine that you can look forward to.   Perform all exercises about fifteen times, three times per day or as directed.  You should exercise both the operative leg and the other leg as well.  Exercises include:   Quad Sets - Tighten up the muscle on the front of the thigh (Quad) and hold for 5-10 seconds.   Straight Leg Raises - With your knee straight (if you were given a brace, keep it on), lift the leg to 60 degrees, hold for 3 seconds, and slowly lower the leg.  Perform this exercise against resistance later as your leg gets stronger.  Leg Slides: Lying on your back, slowly slide your foot toward your buttocks, bending your knee up off  the floor (only go as far as is comfortable). Then slowly slide your foot back down until your leg is flat on the floor again.  Angel Wings: Lying on your back spread your legs to the side as far apart as you can without causing discomfort.  Hamstring Strength:  Lying on your back, push your heel  against the floor with your leg straight by tightening up the muscles of your buttocks.  Repeat, but this time bend your knee to a comfortable angle, and push your heel against the floor.  You may put a pillow under the heel to make it more comfortable if necessary.   A rehabilitation program following joint replacement surgery can speed recovery and prevent re-injury in the future due to weakened muscles. Contact your doctor or a physical therapist for more information on knee rehabilitation.    CONSTIPATION  Constipation is defined medically as fewer than three stools per week and severe constipation as less than one stool per week.  Even if you have a regular bowel pattern at home, your normal regimen is likely to be disrupted due to multiple reasons following surgery.  Combination of anesthesia, postoperative narcotics, change in appetite and fluid intake all can affect your bowels.   YOU MUST use at least one of the following options; they are listed in order of increasing strength to get the job done.  They are all available over the counter, and you may need to use some, POSSIBLY even all of these options:    Drink plenty of fluids (prune juice may be helpful) and high fiber foods Colace 100 mg by mouth twice a day  Senokot for constipation as directed and as needed Dulcolax (bisacodyl), take with full glass of water   Miralax  (polyethylene glycol) once or twice a day as needed.  If you have tried all these things and are unable to have a bowel movement in the first 3-4 days after surgery call either your surgeon or your primary doctor.    If you experience loose stools or diarrhea, hold the medications until you stool forms back up.  If your symptoms do not get better within 1 week or if they get worse, check with your doctor.  If you experience the worst abdominal pain ever or develop nausea or vomiting, please contact the office immediately for further recommendations for  treatment.   ITCHING:  If you experience itching with your medications, try taking only a single pain pill, or even half a pain pill at a time.  You can also use Benadryl over the counter for itching or also to help with sleep.   TED HOSE STOCKINGS:  Use stockings on both legs until for at least 2 weeks or as directed by physician office. They may be removed at night for sleeping.  MEDICATIONS:  See your medication summary on the "After Visit Summary" that nursing will review with you.  You may have some home medications which will be placed on hold until you complete the course of blood thinner medication.  It is important for you to complete the blood thinner medication as prescribed.  PRECAUTIONS:  If you experience chest pain or shortness of breath - call 911 immediately for transfer to the hospital emergency department.   If you develop a fever greater that 101 F, purulent drainage from wound, increased redness or drainage from wound, foul odor from the wound/dressing, or calf pain - CONTACT YOUR SURGEON.  FOLLOW-UP APPOINTMENTS:  If you do not already have a post-op appointment, please call the office for an appointment to be seen by your surgeon.  Guidelines for how soon to be seen are listed in your "After Visit Summary", but are typically between 1-4 weeks after surgery.  OTHER INSTRUCTIONS:   Knee Replacement:  Do not place pillow under knee, focus on keeping the knee straight while resting. CPM instructions: 0-90 degrees, 2 hours in the morning, 2 hours in the afternoon, and 2 hours in the evening. Place foam block, curve side up under heel at all times except when in CPM or when walking.  DO NOT modify, tear, cut, or change the foam block in any way.   MAKE SURE YOU:  Understand these instructions.  Get help right away if you are not doing well or get worse.    Thank you for letting us  be a part of your medical care team.  It is a  privilege we respect greatly.  We hope these instructions will help you stay on track for a fast and full recovery!   Diagnostic Studies: DG HIP UNILAT WITH PELVIS 1V RIGHT Result Date: 03/25/2024 EXAM: 4 VIEW(S) XRAY OF THE RIGHT HIP 03/25/2024 01:28:00 PM COMPARISON: None available. CLINICAL HISTORY: [id number] Surgery, elective [id number]. Interoperative right hip; FT: 13 sec FD: 1.468 mGy Surgery, elective [id number]. Interoperative right hip; FT: 13 sec FD: 1.468 mGy FINDINGS: BONES AND JOINTS: Right total hip arthroplasty components are in place. No acute fracture or focal osseous lesion. SOFT TISSUES: The soft tissues are unremarkable. IMPRESSION: 1. Postoperative changes of right total hip arthroplasty without acute complication detected. Electronically signed by: Lynwood Seip MD 03/25/2024 02:53 PM EDT RP Workstation: [id number]   DG Pelvis Portable Result Date: 03/25/2024 EXAM: 1 or 2 VIEW(S) XRAY OF THE PELVIS 03/25/2024 02:32:00 PM COMPARISON: None available. CLINICAL HISTORY: Avascular necrosis of hip, right (HCC) [id number]. PACU post avascular necrosis of right hip. FINDINGS: BONES AND JOINTS: Right femoral and acetabular components are well situated. SOFT TISSUES: Expected postoperative changes seen in the surrounding soft tissues. IMPRESSION: 1. Right femoral and acetabular arthroplasty components are well positioned 2. Expected postoperative soft tissue changes adjacent to the right hip without acute abnormality Electronically signed by: Lynwood Seip MD 03/25/2024 02:51 PM EDT RP Workstation: [id number]   DG C-Arm 1-60 Min-No Report Result Date: 03/25/2024 Fluoroscopy was utilized by the requesting physician.  No radiographic interpretation.   DG C-Arm 1-60 Min-No Report Result Date: 03/25/2024 Fluoroscopy was utilized by the requesting physician.  No radiographic interpretation.    Disposition: Discharge disposition: 01-Home or Self Care       Discharge Instructions     Call MD / Call  [phone]   Complete by: As directed    If you experience chest pain or shortness of breath, CALL 911 and be transported to the hospital emergency room.  If you develope a fever above 101 F, pus (white drainage) or increased drainage or redness at the wound, or calf pain, call your surgeon's office.   Change dressing   Complete by: As directed    Do not change your dressing.   Constipation Prevention   Complete by: As directed    Drink plenty of fluids.  Prune juice may be helpful.  You may use a stool softener, such as Colace (over the counter) 100 mg twice a day.  Use MiraLax  (over the counter) for constipation as needed.   Diet - low sodium heart healthy   Complete by:  As directed    Discharge instructions   Complete by: As directed    Elevate toes above nose. Use cryotherapy as needed for pain and swelling.   Driving restrictions   Complete by: As directed    No driving for 6 weeks   Increase activity slowly as tolerated   Complete by: As directed    Lifting restrictions   Complete by: As directed    No lifting for 6 weeks   Post-operative opioid taper instructions:   Complete by: As directed    POST-OPERATIVE OPIOID TAPER INSTRUCTIONS: It is important to wean off of your opioid medication as soon as possible. If you do not need pain medication after your surgery it is ok to stop day one. Opioids include: Codeine, Hydrocodone (Norco, Vicodin), Oxycodone (Percocet, oxycontin ) and hydromorphone  amongst others.  Long term and even short term use of opiods can cause: Increased pain response Dependence Constipation Depression Respiratory depression And more.  Withdrawal symptoms can include Flu like symptoms Nausea, vomiting And more Techniques to manage these symptoms Hydrate well Eat regular healthy meals Stay active Use relaxation techniques(deep breathing, meditating, yoga) Do Not substitute Alcohol to help with tapering If you have been on opioids for less than two weeks and  do not have pain than it is ok to stop all together.  Plan to wean off of opioids This plan should start within one week post op of your joint replacement. Maintain the same interval or time between taking each dose and first decrease the dose.  Cut the total daily intake of opioids by one tablet each day Next start to increase the time between doses. The last dose that should be eliminated is the evening dose.      TED hose   Complete by: As directed    Use stockings (TED hose) for 2 weeks on both leg(s).  You may remove them at night for sleeping.   Weight bearing as tolerated   Complete by: As directed         Follow-up Information     Leigh Valery RAMAN, PA-C. Go on 04/13/2024.   Specialty: Orthopedic Surgery Why: You are scheduled for a post op appointment on Monday 04/13/24 at 10:00am Contact information: [address] [place] [address] [phone]                  Signed: Valery RAMAN Leigh 03/30/2024, 1:43 PM

## 2024-03-31 ENCOUNTER — Encounter: Payer: Self-pay | Admitting: Family Medicine

## 2024-03-31 ENCOUNTER — Ambulatory Visit (INDEPENDENT_AMBULATORY_CARE_PROVIDER_SITE_OTHER): Admitting: Family Medicine

## 2024-03-31 VITALS — BP 140/82 | HR 95 | Temp 98.8°F | Ht 68.0 in | Wt 137.0 lb

## 2024-03-31 DIAGNOSIS — Z4802 Encounter for removal of sutures: Secondary | ICD-10-CM

## 2024-03-31 MED ORDER — DICLOFENAC SODIUM 1 % EX GEL: 4.0000 g | Freq: Four times a day (QID) | CUTANEOUS | 3 refills | Status: AC

## 2024-03-31 NOTE — Progress Notes (Signed)
 Subjective:    Patient ID: [name], male    DOB: 1971/01/25, 53 y.o.   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  On October 13 the following procedure was performed:  Lesion was prepped and draped in sterile fashion.  A 4 cm x 2 cm elliptical excision was performed around the lesion in its entirety down to the underlying subcutaneous tissue.  The lesion was then excised using a scalpel down to the underlying subcutaneous tissue including the dermis.  The lesion was sent to pathology in a labeled container.  The skin edges were then approximated using 6, simple interrupted 3-0 Ethilon sutures.  Stitches out in 1 week.  Wound care was discussed.  Patient is here today for suture removal.  The operative site appears warm and well-healed with no evidence of secondary cellulitis.  There is no subcutaneous purulence or fluid collection.  Biopsy showed no residual malignancy and the biopsy margins were free of any cancer. Past Medical History:  Diagnosis Date   AICD (automatic cardioverter/defibrillator) present    Arthritis    Cardiomyopathy, nonischemic (HCC)    takes Digoxin  daily and Carvedilol  daily   CHF (congestive heart failure) (HCC)    takes Lasix  daily as well Aldactone    Chronic back pain    Chronic systolic heart failure (HCC)    Cirrhosis (HCC)    Depression    takes Prozac  daily   Diabetes mellitus without complication (HCC)    preDM   GERD (gastroesophageal reflux disease)    takes Omeprazole  daily   Hx of Alcohol consumption heavy    rare beer currently   hx of Tobacco abuse    quit smoking 2014   Hyperlipidemia    was on Simvastatin  but has been off a year   Hypertension    Insomnia    takes Ambien  as needed(just got script yesterday)   Orthostatic hypotension    Scoliosis    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 53 (XRT and chemo)   Past Surgical History:  Procedure Laterality Date   BIOPSY   10/31/2022   Procedure: BIOPSY;  Surgeon: Leigh Elspeth SQUIBB, MD;  Location: [facility] ENDOSCOPY;  Service: Gastroenterology;;   ORIN MEDIATE RELEASE Right 01/09/2013   Procedure: CARPAL TUNNEL RELEASE;  Surgeon: Rockey LITTIE Peru, MD;  Location: [facility] NEURO ORS;  Service: Neurosurgery;  Laterality: Right;  RIGHT carpal tunnel release   CARPAL TUNNEL RELEASE Left 01/30/2013   Procedure: LEFT CARPAL TUNNEL RELEASE;  Surgeon: Rockey LITTIE Peru, MD;  Location: [facility] NEURO ORS;  Service: Neurosurgery;  Laterality: Left;  LEFT Carpal Tunnel release   CHEST TUBE INSERTION Right 09/09/2013   Procedure: INSERTION PLEURAL DRAINAGE CATHETER;  Surgeon: Maude Fleeta Ochoa, MD;  Location: [facility] OR;  Service: Thoracic;  Laterality: Right;   COLONOSCOPY WITH PROPOFOL  N/A 10/31/2022   Procedure: COLONOSCOPY WITH PROPOFOL ;  Surgeon: Leigh Elspeth SQUIBB, MD;  Location: [facility] ENDOSCOPY;  Service: Gastroenterology;  Laterality: N/A;   HYDROCELE EXCISION Right 04/11/2017   Procedure: RIGHT HYDROCELECTOMY ADULT;  Surgeon: Watt Rush, MD;  Location: [facility] ORS;  Service: Urology;  Laterality: Right;   hydrocelectomy  2008   ICD  05/25/2011   [facility] [employer] SG lead/[employer]ner[employer] single chamber device   IMPLANTABLE CARDIOVERTER DEFIBRILLATOR IMPLANT N/A 05/25/2011   Procedure: IMPLANTABLE CARDIOVERTER DEFIBRILLATOR IMPLANT;  Surgeon: Danelle LELON Birmingham, MD;  Location: [facility] CATH LAB;  Service: Cardiovascular;  Laterality: N/A;   IR  FLUORO GUIDE CV LINE RIGHT  01/16/2023   IR US  GUIDE VASC ACCESS RIGHT  01/16/2023   NEPHRECTOMY  36 yrs ago   Wilms Tumor   POLYPECTOMY  10/31/2022   Procedure: POLYPECTOMY;  Surgeon: Leigh Elspeth SQUIBB, MD;  Location: [facility] ENDOSCOPY;  Service: Gastroenterology;;   RIGHT HEART CATH N/A 01/29/2019   Procedure: RIGHT HEART CATH;  Surgeon: Cherrie Toribio SAUNDERS, MD;  [facility]: [facility] INVASIVE CV LAB;  Service: Cardiovascular;  Laterality: N/A;   RIGHT HEART CATH N/A 09/08/2019   Procedure: RIGHT HEART CATH;  Surgeon: Cherrie Toribio SAUNDERS, MD;  [facility]: [facility] INVASIVE CV LAB;  Service: Cardiovascular;  Laterality: N/A;   RIGHT HEART CATH N/A 01/15/2023   Procedure: RIGHT HEART CATH;  Surgeon: Cherrie Toribio SAUNDERS, MD;  [facility] INVASIVE CV LAB;  Service: Cardiovascular;  Laterality: N/A;   RIGHT/LEFT HEART CATH AND CORONARY ANGIOGRAPHY N/A 02/12/2017   Procedure: RIGHT/LEFT HEART CATH AND CORONARY ANGIOGRAPHY;  Surgeon: Cherrie Toribio SAUNDERS, MD;  [facility]: [facility] INVASIVE CV LAB;  Service: Cardiovascular;  Laterality: N/A;   TOTAL HIP ARTHROPLASTY Right 03/25/2024   Procedure: ARTHROPLASTY, HIP, TOTAL, ANTERIOR APPROACH;  Surgeon: [name], MD;  Location: [facility] ORS;  Service: Orthopedics;  Laterality: Right;   ULNAR NERVE TRANSPOSITION Right 01/09/2013   Procedure: ULNAR NERVE DECOMPRESSION/TRANSPOSITION;  Surgeon: Rockey LITTIE Peru, MD;  [facility] NEURO ORS;  Service: Neurosurgery;  Laterality: Right;  RIGHT ulnar nerve decompression   Current Outpatient Medications on File Prior to Visit  Medication Sig Dispense Refill   aspirin  (ASPIRIN  CHILDRENS) 81 MG chewable tablet Chew 1 tablet (81 mg total) by mouth 2 (two) times daily with a meal. 90 tablet 0   atovaquone (MEPRON) 750 MG/5ML suspension Take 750 mg by mouth daily.     azithromycin  (ZITHROMAX ) 500 MG tablet Take 500 mg by mouth in the morning.     Blood Glucose Monitoring Suppl DEVI 1 each by Does not apply route in the morning, at noon, and at bedtime. May substitute to any manufacturer covered by patient's insurance. 1 each 0   cetirizine  (ZYRTEC ) 10 MG tablet Take 1 tablet (10 mg total) by mouth daily. 90 tablet 3   colchicine  0.6 MG tablet Take 2 at first sign of attack And repeat 1 more in 1 hour (Patient taking differently: Take 0.6-1.2 mg by mouth daily as needed (gout attack.). Take 2 at first sign of attack And repeat 1 more in 1 hour) 30 tablet 1   docusate sodium  (COLACE) 100 MG capsule Take 1 capsule (100 mg total) by mouth 2 (two) times daily. 60 capsule 0    fluticasone  (FLONASE ) 50 MCG/ACT nasal spray Place 2 sprays into both nostrils daily. (Patient taking differently: Place 2 sprays into both nostrils daily as needed for allergies.) 16 g 6   HYDROcodone -acetaminophen  (NORCO/VICODIN) 5-325 MG tablet Take 1 tablet by mouth every 4 (four) hours as needed for up to 7 days for moderate pain (pain score 4-6) or severe pain (pain score 7-10). 42 tablet 0   MAGNESIUM -OXIDE 400 (240 Mg) MG tablet Take 400 mg by mouth in the morning.     methocarbamol (ROBAXIN) 500 MG tablet Take 1 tablet (500 mg total) by mouth every 6 (six) hours as needed for muscle spasms. 20 tablet 0   mycophenolate (CELLCEPT) 250 MG capsule Take 250 mg by mouth 2 (two) times daily.     nebivolol (BYSTOLIC) 10 MG tablet Take 1 tablet (10 mg total) by mouth daily. 90 tablet 3  nystatin (MYCOSTATIN/NYSTOP) powder Apply 1 Application topically 2 (two) times daily. To groin and other affected areas. 60 g 1   ondansetron  (ZOFRAN ) 4 MG tablet Take 1 tablet (4 mg total) by mouth every 8 (eight) hours as needed for nausea or vomiting. 30 tablet 0   pantoprazole  (PROTONIX ) 40 MG tablet Take 40 mg by mouth daily before breakfast.     polyethylene glycol powder (GLYCOLAX /MIRALAX ) 17 GM/SCOOP powder Take 17 g dissolved in liquid by mouth daily as needed for mild constipation or moderate constipation. 238 g 0   predniSONE  (DELTASONE ) 2.5 MG tablet Take 2.5 mg by mouth daily with breakfast.     rosuvastatin (CRESTOR) 5 MG tablet Take 5 mg by mouth every evening.     senna (SENOKOT) 8.6 MG TABS tablet Take 2 tablets (17.2 mg total) by mouth at bedtime for 15 days. 30 tablet 0   tacrolimus  (PROGRAF ) 1 MG capsule Take 3 mg by mouth 2 (two) times daily.     No current facility-administered medications on file prior to visit.   Allergies  Allergen Reactions   Ibuprofen Hives   Labetalol  Other (See Comments)    fatigue  labetalol    Nsaids     ELEVATED LFT'S   Social History   Socioeconomic  History   Marital status: Single    Spouse name: Not on file   Number of children: 1   Years of education: Not on file   Highest education level: Not on file  Occupational History   Occupation: Full time    Comment: Engineer, petroleum  Tobacco Use   Smoking status: Former    Current packs/day: 0.00    Average packs/day: 0.3 packs/day for 20.0 years (5.0 ttl pk-yrs)    Types: Cigarettes    Start date: 09/07/1992    Quit date: 09/07/2012    Years since quitting: 11.5   Smokeless tobacco: Former    Types: Snuff    Quit date: 02/2022  Vaping Use   Vaping status: Never Used  Substance and Sexual Activity   Alcohol use: Not Currently    Comment: histoorically a heavy drinker (beer only), none for a couple months    Drug use: No   Sexual activity: Not Currently  Other Topics Concern   Not on file  Social History Narrative   Not on file   Social Drivers of Health   Financial Resource Strain: Low Risk  (11/21/2023)   Received from [facility]   Overall Financial Resource Strain (CARDIA)    Difficulty of Paying Living Expenses: Not hard at all  Food Insecurity: No Food Insecurity (03/25/2024)   Hunger Vital Sign    Worried About Running Out of Food in the Last Year: Never true    Ran Out of Food in the Last Year: Never true  Transportation Needs: No Transportation Needs (03/25/2024)   PRAPARE - Administrator, Civil Service (Medical): No    Lack of Transportation (Non-Medical): No  Physical Activity: Inactive (10/09/2023)   Exercise Vital Sign    Days of Exercise per Week: 0 days    Minutes of Exercise per Session: 0 min  Stress: No Stress Concern Present (10/09/2023)   [employer] of Occupational Health - Occupational Stress Questionnaire    Feeling of Stress : Not at all  Social Connections: Moderately Isolated (10/09/2023)   Social Connection and Isolation Panel    Frequency of Communication with Friends and Family: Three times a week  Frequency of Social Gatherings with Friends and Family: Three times a week    Attends Religious Services: 1 to 4 times per year    Active Member of Clubs or Organizations: No    Attends Banker Meetings: Never    Marital Status: Never married  Intimate Partner Violence: Not At Risk (03/25/2024)   Humiliation, Afraid, Rape, and Kick questionnaire    Fear of Current or Ex-Partner: No    Emotionally Abused: No    Physically Abused: No    Sexually Abused: No   Family History  Problem Relation Age of Onset   Diabetes Mother    Colon cancer Neg Hx    Stomach cancer Neg Hx    Esophageal cancer Neg Hx       Review of Systems  All other systems reviewed and are negative.      Objective:   Physical Exam Vitals reviewed.  Constitutional:      General: He is not in acute distress.    Appearance: Normal appearance. He is normal weight. He is not ill-appearing or toxic-appearing.  Neck:     Vascular: No carotid bruit.  Cardiovascular:     Rate and Rhythm: Regular rhythm. Tachycardia present.     Heart sounds: Normal heart sounds. No murmur heard.    No gallop.  Pulmonary:     Effort: No respiratory distress.     Breath sounds: Normal breath sounds. No wheezing, rhonchi or rales.  Abdominal:     General: Abdomen is flat. Bowel sounds are normal.     Palpations: Abdomen is soft.  Musculoskeletal:       Arms:     Cervical back: Neck supple.     Right lower leg: No edema.     Left lower leg: No edema.  Lymphadenopathy:     Cervical: No cervical adenopathy.  Skin:    Findings: Lesion present.  Neurological:     General: No focal deficit present.     Mental Status: He is alert and oriented to person, place, and time. Mental status is at baseline.     Cranial Nerves: No cranial nerve deficit.     Motor: No weakness.   6 cm healed operative site with 6 sutures    Assessment & Plan:  Visit for suture removal 6 sutures removed without difficulty.  Wound  reinforced with Steri-Strips

## 2024-04-01 DIAGNOSIS — Z48298 Encounter for aftercare following other organ transplant: Secondary | ICD-10-CM | POA: Diagnosis not present

## 2024-04-06 ENCOUNTER — Other Ambulatory Visit: Payer: Self-pay | Admitting: Family Medicine

## 2024-04-06 NOTE — Telephone Encounter (Signed)
 Copied from CRM [id number]. Topic: Clinical - Medication Refill >> Apr 06, 2024  8:57 AM Tiffany B wrote: Medication: oxycodone  5 mg (patient states PCP would prescribe after surgery)  Has the patient contacted their pharmacy? Yes  This is the patient's preferred pharmacy:  [facility] [address], [place] - [address] [place] [address] Phone: [phone] Fax: [phone]   Is this the correct pharmacy for this prescription? Yes   Has the prescription been filled recently? No   Is the patient out of the medication? Yes   Has the patient been seen for an appointment in the last year OR does the patient have an upcoming appointment? Yes    Can we respond through MyChart? No   Agent: Please be advised that Rx refills may take up to 3 business days. We ask that you follow-up with your pharmacy.

## 2024-04-07 NOTE — Telephone Encounter (Signed)
 Requested medication (s) are due for refill today: yes  Requested medication (s) are on the active medication list: yes  Last refill:  04/13/22  Future visit scheduled: no  Notes to clinic:  Unable to refill per protocol, cannot delegate.      Requested Prescriptions  Pending Prescriptions Disp Refills   oxyCODONE  (OXY IR/ROXICODONE ) 5 MG immediate release tablet 60 tablet 0    Sig: Take 1 tablet (5 mg total) by mouth 3 (three) times daily as needed for severe pain (pain score 7-10).     Not Delegated - Analgesics:  Opioid Agonists Failed - 04/07/2024  2:09 PM      Failed - This refill cannot be delegated      Failed - Urine Drug Screen completed in last 360 days      Passed - Valid encounter within last 3 months    Recent Outpatient Visits           1 week ago Visit for suture removal   [place] [facility] Duanne Butler DASEN, MD   2 weeks ago Squamous cell carcinoma, arm, right   [place] [facility] Duanne, Butler DASEN, MD   4 weeks ago Skin lesion of right arm   [place] [facility] Duanne Butler DASEN, MD   2 months ago AKI (acute kidney injury)   [place] [facility] Duanne Butler DASEN, MD   7 months ago Type 2 diabetes mellitus with other specified complication, without long-term current use of insulin  [facility])   [place] [facility] Pickard, Butler DASEN, MD

## 2024-04-08 DIAGNOSIS — Z48298 Encounter for aftercare following other organ transplant: Secondary | ICD-10-CM | POA: Diagnosis not present

## 2024-04-10 MED ORDER — OXYCODONE HCL 5 MG PO TABS: 5.0000 mg | ORAL_TABLET | Freq: Three times a day (TID) | ORAL | 0 refills | Status: DC | PRN

## 2024-04-13 DIAGNOSIS — Z471 Aftercare following joint replacement surgery: Secondary | ICD-10-CM | POA: Diagnosis not present

## 2024-04-13 DIAGNOSIS — Z96641 Presence of right artificial hip joint: Secondary | ICD-10-CM | POA: Diagnosis not present

## 2024-04-14 DIAGNOSIS — Z941 Heart transplant status: Secondary | ICD-10-CM | POA: Diagnosis not present

## 2024-04-14 DIAGNOSIS — Z48298 Encounter for aftercare following other organ transplant: Secondary | ICD-10-CM | POA: Diagnosis not present

## 2024-04-22 DIAGNOSIS — Z48298 Encounter for aftercare following other organ transplant: Secondary | ICD-10-CM | POA: Diagnosis not present

## 2024-04-22 DIAGNOSIS — Z941 Heart transplant status: Secondary | ICD-10-CM | POA: Diagnosis not present

## 2024-04-23 ENCOUNTER — Other Ambulatory Visit: Payer: Self-pay | Admitting: Family Medicine

## 2024-04-23 NOTE — Telephone Encounter (Signed)
 Copied from CRM [id number]. Topic: Clinical - Medication Refill >> Apr 23, 2024  8:11 AM Darshell M wrote: Medication: oxyCODONE  (OXY IR/ROXICODONE ) 5 MG immediate release tablet   Has the patient contacted their pharmacy? No (Agent: If no, request that the patient contact the pharmacy for the refill. If patient does not wish to contact the pharmacy document the reason why and proceed with request.) (Agent: If yes, when and what did the pharmacy advise?)  This is the patient's preferred pharmacy:  [facility] [address], [place] - [address] [place] [address] Phone: [phone] Fax: [phone]  Is this the correct pharmacy for this prescription? Yes If no, delete pharmacy and type the correct one.   Has the prescription been filled recently? No  Is the patient out of the medication? Yes  Has the patient been seen for an appointment in the last year OR does the patient have an upcoming appointment? Yes  Can we respond through MyChart? No  Agent: Please be advised that Rx refills may take up to 3 business days. We ask that you follow-up with your pharmacy.

## 2024-04-24 NOTE — Telephone Encounter (Signed)
 Requested medications are due for refill today.  A little too soon - pt given a 20 day supply  Requested medications are on the active medications list.  yes  Last refill. 04/10/2024 #60 0 rf  Future visit scheduled.   yes  Notes to clinic.  Refill not delegated.    Requested Prescriptions  Pending Prescriptions Disp Refills   oxyCODONE  (OXY IR/ROXICODONE ) 5 MG immediate release tablet 60 tablet 0    Sig: Take 1 tablet (5 mg total) by mouth 3 (three) times daily as needed for severe pain (pain score 7-10).     Not Delegated - Analgesics:  Opioid Agonists Failed - 04/24/2024  4:42 PM      Failed - This refill cannot be delegated      Failed - Urine Drug Screen completed in last 360 days      Passed - Valid encounter within last 3 months    Recent Outpatient Visits           3 weeks ago Visit for suture removal   [place] [facility] Duanne Butler DASEN, MD   1 month ago Squamous cell carcinoma, arm, right   [place] [facility] Duanne, Butler DASEN, MD   1 month ago Skin lesion of right arm   [place] [facility] Duanne Butler DASEN, MD   3 months ago AKI (acute kidney injury)   [place] [facility] Duanne Butler DASEN, MD   7 months ago Type 2 diabetes mellitus with other specified complication, without long-term current use of insulin  [facility])   [place] [facility] Pickard, Butler DASEN, MD

## 2024-04-28 ENCOUNTER — Other Ambulatory Visit: Payer: Self-pay | Admitting: Family Medicine

## 2024-04-28 ENCOUNTER — Telehealth: Payer: Self-pay | Admitting: Family Medicine

## 2024-04-28 NOTE — Telephone Encounter (Signed)
 Requesting call if unable to refill  Copied from CRM #[id number]. Topic: Clinical - Medication Refill >> Apr 28, 2024 10:19 AM [name] wrote: Medication: oxyCODONE  (OXY IR/ROXICODONE ) 5 MG immediate release tablet [[id number]]   Patient would please like callback if the rx can't be refilled [phone]   Has the patient contacted their pharmacy? Yes (Agent: If no, request that the patient contact the pharmacy for the refill. If patient does not wish to contact the pharmacy document the reason why and proceed with request.) (Agent: If yes, when and what did the pharmacy advise?)  This is the patient's preferred pharmacy:  [facility] [address], [place] - [address] [place] [address] Phone: [phone] Fax: [phone]   Is this the correct pharmacy for this prescription? Yes If no, delete pharmacy and type the correct one.   Has the prescription been filled recently? No  Is the patient out of the medication? Yes  Has the patient been seen for an appointment in the last year OR does the patient have an upcoming appointment? Yes  Can we respond through MyChart? No  Agent: Please be advised that Rx refills may take up to 3 business days. We ask that you follow-up with your pharmacy.

## 2024-04-28 NOTE — Telephone Encounter (Unsigned)
 Copied from CRM [id number]. Topic: Clinical - Medication Refill >> Apr 28, 2024 10:19 AM [name] wrote: Medication: oxyCODONE  (OXY IR/ROXICODONE ) 5 MG immediate release tablet [[id number]]   Patient would please like callback if the rx can't be refilled [phone]   Has the patient contacted their pharmacy? Yes (Agent: If no, request that the patient contact the pharmacy for the refill. If patient does not wish to contact the pharmacy document the reason why and proceed with request.) (Agent: If yes, when and what did the pharmacy advise?)  This is the patient's preferred pharmacy:  [facility] [address], [place] - [address] [place] [address] Phone: [phone] Fax: [phone]   Is this the correct pharmacy for this prescription? Yes If no, delete pharmacy and type the correct one.   Has the prescription been filled recently? No  Is the patient out of the medication? Yes  Has the patient been seen for an appointment in the last year OR does the patient have an upcoming appointment? Yes  Can we respond through MyChart? No  Agent: Please be advised that Rx refills may take up to 3 business days. We ask that you follow-up with your pharmacy.

## 2024-04-29 DIAGNOSIS — Z941 Heart transplant status: Secondary | ICD-10-CM | POA: Diagnosis not present

## 2024-04-29 DIAGNOSIS — Z48298 Encounter for aftercare following other organ transplant: Secondary | ICD-10-CM | POA: Diagnosis not present

## 2024-04-30 NOTE — Telephone Encounter (Signed)
 Requested medication (s) are due for refill today: yes  Requested medication (s) are on the active medication list: yes  Last refill:  04/10/24  Future visit scheduled: yes  Notes to clinic:  Unable to refill per protocol, cannot delegate.      Requested Prescriptions  Pending Prescriptions Disp Refills   oxyCODONE  (OXY IR/ROXICODONE ) 5 MG immediate release tablet 60 tablet 0    Sig: Take 1 tablet (5 mg total) by mouth 3 (three) times daily as needed for severe pain (pain score 7-10).     Not Delegated - Analgesics:  Opioid Agonists Failed - 04/30/2024  4:43 PM      Failed - This refill cannot be delegated      Failed - Urine Drug Screen completed in last 360 days      Passed - Valid encounter within last 3 months    Recent Outpatient Visits           1 month ago Visit for suture removal   [facility] Du[facility] Butler DASEN, MD   1 month ago Squamous cell carcinoma, arm, right   [place] [facility] Duanne, Butler DASEN, MD   1 month ago Skin lesion of right arm   [place] [facility] Duanne Butler DASEN, MD   3 months ago AKI (acute kidney injury)   [place] [facility] Duanne Butler DASEN, MD   7 months ago Type 2 diabetes mellitus with other specified complication, without long-term current use of insulin  [facility])   [place] [facility] Pickard, Butler DASEN, MD

## 2024-05-01 MED ORDER — OXYCODONE HCL 5 MG PO TABS: 5.0000 mg | ORAL_TABLET | Freq: Three times a day (TID) | ORAL | 0 refills | Status: DC | PRN

## 2024-05-06 DIAGNOSIS — Z48298 Encounter for aftercare following other organ transplant: Secondary | ICD-10-CM | POA: Diagnosis not present

## 2024-05-06 DIAGNOSIS — Z941 Heart transplant status: Secondary | ICD-10-CM | POA: Diagnosis not present

## 2024-05-07 ENCOUNTER — Encounter

## 2024-05-13 ENCOUNTER — Ambulatory Visit

## 2024-05-13 DIAGNOSIS — N1832 Chronic kidney disease, stage 3b: Secondary | ICD-10-CM | POA: Diagnosis not present

## 2024-05-13 DIAGNOSIS — I129 Hypertensive chronic kidney disease with stage 1 through stage 4 chronic kidney disease, or unspecified chronic kidney disease: Secondary | ICD-10-CM | POA: Diagnosis not present

## 2024-05-13 DIAGNOSIS — Z4821 Encounter for aftercare following heart transplant: Secondary | ICD-10-CM | POA: Diagnosis not present

## 2024-05-13 DIAGNOSIS — Z79899 Other long term (current) drug therapy: Secondary | ICD-10-CM | POA: Diagnosis not present

## 2024-05-13 DIAGNOSIS — D709 Neutropenia, unspecified: Secondary | ICD-10-CM | POA: Diagnosis not present

## 2024-05-13 DIAGNOSIS — N1831 Chronic kidney disease, stage 3a: Secondary | ICD-10-CM | POA: Diagnosis not present

## 2024-05-13 DIAGNOSIS — A430 Pulmonary nocardiosis: Secondary | ICD-10-CM | POA: Diagnosis not present

## 2024-05-13 DIAGNOSIS — I130 Hypertensive heart and chronic kidney disease with heart failure and stage 1 through stage 4 chronic kidney disease, or unspecified chronic kidney disease: Secondary | ICD-10-CM | POA: Diagnosis not present

## 2024-05-13 DIAGNOSIS — E559 Vitamin D deficiency, unspecified: Secondary | ICD-10-CM | POA: Diagnosis not present

## 2024-05-13 DIAGNOSIS — N183 Chronic kidney disease, stage 3 unspecified: Secondary | ICD-10-CM | POA: Diagnosis not present

## 2024-05-13 DIAGNOSIS — Z2989 Encounter for other specified prophylactic measures: Secondary | ICD-10-CM | POA: Diagnosis not present

## 2024-05-13 DIAGNOSIS — Z96641 Presence of right artificial hip joint: Secondary | ICD-10-CM | POA: Diagnosis not present

## 2024-05-13 DIAGNOSIS — Z87891 Personal history of nicotine dependence: Secondary | ICD-10-CM | POA: Diagnosis not present

## 2024-05-13 DIAGNOSIS — I071 Rheumatic tricuspid insufficiency: Secondary | ICD-10-CM | POA: Diagnosis not present

## 2024-05-13 DIAGNOSIS — Z941 Heart transplant status: Secondary | ICD-10-CM | POA: Diagnosis not present

## 2024-05-13 DIAGNOSIS — Z48298 Encounter for aftercare following other organ transplant: Secondary | ICD-10-CM | POA: Diagnosis not present

## 2024-05-13 DIAGNOSIS — E1122 Type 2 diabetes mellitus with diabetic chronic kidney disease: Secondary | ICD-10-CM | POA: Diagnosis not present

## 2024-05-13 DIAGNOSIS — E785 Hyperlipidemia, unspecified: Secondary | ICD-10-CM | POA: Diagnosis not present

## 2024-05-13 DIAGNOSIS — D84821 Immunodeficiency due to drugs: Secondary | ICD-10-CM | POA: Diagnosis not present

## 2024-05-15 DIAGNOSIS — Z96641 Presence of right artificial hip joint: Secondary | ICD-10-CM | POA: Diagnosis not present

## 2024-05-15 DIAGNOSIS — Z471 Aftercare following joint replacement surgery: Secondary | ICD-10-CM | POA: Diagnosis not present

## 2024-05-21 ENCOUNTER — Ambulatory Visit

## 2024-05-21 DIAGNOSIS — Z23 Encounter for immunization: Secondary | ICD-10-CM

## 2024-05-21 NOTE — Progress Notes (Signed)
 Patient is in office today for a nurse visit for Immunization. Patient Injection was given in the  Left deltoid. Patient tolerated injection well.

## 2024-05-29 ENCOUNTER — Other Ambulatory Visit: Payer: Self-pay | Admitting: Family Medicine

## 2024-05-29 NOTE — Telephone Encounter (Signed)
 Copied from CRM [id number]. Topic: Clinical - Medication Refill >> May 29, 2024  9:32 AM Rosaria E wrote: Medication: oxyCODONE  (OXY IR/ROXICODONE ) 5 MG immediate release tablet  Has the patient contacted their pharmacy? Yes (Agent: If no, request that the patient contact the pharmacy for the refill. If patient does not wish to contact the pharmacy document the reason why and proceed with request.) (Agent: If yes, when and what did the pharmacy advise?)  This is the patient's preferred pharmacy:  [facility] [address], [place] - [address] [place] [address] Phone: [phone] Fax: [phone]  Is this the correct pharmacy for this prescription? Yes If no, delete pharmacy and type the correct one.   Has the prescription been filled recently? Yes  Is the patient out of the medication? No.  Has the patient been seen for an appointment in the last year OR does the patient have an upcoming appointment? Yes  Can we respond through MyChart? Yes  Agent: Please be advised that Rx refills may take up to 3 business days. We ask that you follow-up with your pharmacy.

## 2024-06-02 ENCOUNTER — Telehealth: Payer: Self-pay

## 2024-06-02 ENCOUNTER — Other Ambulatory Visit: Payer: Self-pay | Admitting: Family Medicine

## 2024-06-02 MED ORDER — OXYCODONE HCL 5 MG PO TABS: 5.0000 mg | ORAL_TABLET | Freq: Three times a day (TID) | ORAL | 0 refills | Status: DC | PRN

## 2024-06-02 NOTE — Telephone Encounter (Signed)
 Copied from CRM [id number]. Topic: Clinical - Medication Refill >> May 29, 2024  9:32 AM Rosaria E wrote: Medication: oxyCODONE  (OXY IR/ROXICODONE ) 5 MG immediate release tablet  Has the patient contacted their pharmacy? Yes (Agent: If no, request that the patient contact the pharmacy for the refill. If patient does not wish to contact the pharmacy document the reason why and proceed with request.) (Agent: If yes, when and what did the pharmacy advise?)  This is the patient's preferred pharmacy:  [facility] [address] [place] - [address] [place] [address] Phone: [phone] Fax: [phone]  Is this the correct pharmacy for this prescription? Yes If no, delete pharmacy and type the correct one.   Has the prescription been filled recently? Yes  Is the patient out of the medication? No.  Has the patient been seen for an appointment in the last year OR does the patient have an upcoming appointment? Yes  Can we respond through MyChart? Yes  Agent: Please be advised that Rx refills may take up to 3 business days. We ask that you follow-up with your pharmacy. >> Jun 02, 2024  9:30 AM Emylou G wrote: Patient called.. checking status of refill - he is out

## 2024-06-02 NOTE — Telephone Encounter (Signed)
 Requested medication (s) are due for refill today - yes  Requested medication (s) are on the active medication list -yes  Future visit scheduled -no  Last refill: 05/01/24 #60  Notes to clinic: non delegated Rx  Requested Prescriptions  Pending Prescriptions Disp Refills   oxyCODONE  (OXY IR/ROXICODONE ) 5 MG immediate release tablet 60 tablet 0    Sig: Take 1 tablet (5 mg total) by mouth 3 (three) times daily as needed for severe pain (pain score 7-10).     Not Delegated - Analgesics:  Opioid Agonists Failed - 06/02/2024 12:45 PM      Failed - This refill cannot be delegated      Failed - Urine Drug Screen completed in last 360 days      Passed - Valid encounter within last 3 months    Recent Outpatient Visits           2 months ago Visit for suture removal   [place] [facility] Duanne Butler DASEN, MD   2 months ago Squamous cell carcinoma, arm, right   [place] [facility] Duanne Butler DASEN, MD   2 months ago Skin lesion of right arm   [place] [facility] Duanne Butler DASEN, MD   4 months ago AKI (acute kidney injury)   [place] [facility] Duanne Butler DASEN, MD   8 months ago Type 2 diabetes mellitus with other specified complication, without long-term current use of insulin  [facility])   [place] [facility] Pickard, Butler DASEN, MD                 Requested Prescriptions  Pending Prescriptions Disp Refills   oxyCODONE  (OXY IR/ROXICODONE ) 5 MG immediate release tablet 60 tablet 0    Sig: Take 1 tablet (5 mg total) by mouth 3 (three) times daily as needed for severe pain (pain score 7-10).     Not Delegated - Analgesics:  Opioid Agonists Failed - 06/02/2024 12:45 PM      Failed - This refill cannot be delegated      Failed - Urine Drug Screen completed in last 360 days      Passed - Valid encounter within last 3 months    Recent Outpatient Visits           2 months  ago Visit for suture removal   [place] [facility] Duanne Butler DASEN, MD   2 months ago Squamous cell carcinoma, arm, right   [place] [facility] Duanne Butler DASEN, MD   2 months ago Skin lesion of right arm   [place] Duanne Butler DASEN, MD   4 months ago AKI (acute kidney injury)   [place] [facility] Medicine Duanne Butler DASEN, MD   8 months ago Type 2 diabetes mellitus with other specified complication, without long-term current use of insulin  [facility])   [place] [facility] Pickard, Butler DASEN, MD

## 2024-06-30 ENCOUNTER — Other Ambulatory Visit: Payer: Self-pay | Admitting: Family Medicine

## 2024-06-30 MED ORDER — OXYCODONE HCL 5 MG PO TABS: 5.0000 mg | ORAL_TABLET | Freq: Three times a day (TID) | ORAL | 0 refills | Status: AC | PRN

## 2024-06-30 NOTE — Telephone Encounter (Signed)
 Requested medications are due for refill today.  yes  Requested medications are on the active medications list.  yes  Last refill. 06/02/2024 #60 0 rf  Future visit scheduled.   no  Notes to clinic.  Refill not delegated.     Requested Prescriptions  Pending Prescriptions Disp Refills   oxyCODONE  (OXY IR/ROXICODONE ) 5 MG immediate release tablet 60 tablet 0    Sig: Take 1 tablet (5 mg total) by mouth 3 (three) times daily as needed for severe pain (pain score 7-10).     Not Delegated - Analgesics:  Opioid Agonists Failed - 06/30/2024  3:12 PM      Failed - This refill cannot be delegated      Failed - Urine Drug Screen completed in last 360 days      Failed - Valid encounter within last 3 months    Recent Outpatient Visits           3 months ago Visit for suture removal   [place] [facility] Duanne Butler DASEN, MD   3 months ago Squamous cell carcinoma, arm, right   [place] [facility] Duanne Butler DASEN, MD   3 months ago Skin lesion of right arm   [place] [facility] Medicine Duanne Butler DASEN, MD   5 months ago AKI (acute kidney injury)   [place] [facility] Duanne Butler DASEN, MD   9 months ago Type 2 diabetes mellitus with other specified complication, without long-term current use of insulin  [facility])   [place] [facility] Pickard, Butler DASEN, MD

## 2024-06-30 NOTE — Telephone Encounter (Unsigned)
 Copied from CRM [id number]. Topic: Clinical - Medication Refill >> Jun 30, 2024  8:28 AM Ivette P wrote: Medication: oxyCODONE  (OXY IR/ROXICODONE ) 5 MG immediate release tablet  Has the patient contacted their pharmacy? Yes (Agent: If no, request that the patient contact the pharmacy for the refill. If patient does not wish to contact the pharmacy document the reason why and proceed with request.) (Agent: If yes, when and what did the pharmacy advise?)  This is the patient's preferred pharmacy:  [facility] [address] [place] - [address] [place] [address] Phone: [phone] Fax: [phone]  Is this the correct pharmacy for this prescription? Yes If no, delete pharmacy and type the correct one.   Has the prescription been filled recently? No  Is the patient out of the medication? Yes, enough for today and tomorrow  Has the patient been seen for an appointment in the last year OR does the patient have an upcoming appointment? No, last visit was 03/31/24  Can we respond through MyChart? No  Agent: Please be advised that Rx refills may take up to 3 business days. We ask that you follow-up with your pharmacy.

## 2024-06-30 NOTE — Telephone Encounter (Signed)
 Copied from CRM [id number]. Topic: Clinical - Medication Refill >> Jun 30, 2024  8:28 AM Ivette P wrote: Medication: oxyCODONE  (OXY IR/ROXICODONE ) 5 MG immediate release tablet  Has the patient contacted their pharmacy? Yes (Agent: If no, request that the patient contact the pharmacy for the refill. If patient does not wish to contact the pharmacy document the reason why and proceed with request.) (Agent: If yes, when and what did the pharmacy advise?)  This is the patient's preferred pharmacy:  [facility] [address] [place] - [address] [place] [address] Phone: [phone] Fax: [phone]  Is this the correct pharmacy for this prescription? Yes If no, delete pharmacy and type the correct one.   Has the prescription been filled recently? No  Is the patient out of the medication? Yes, enough for today and tomorrow  Has the patient been seen for an appointment in the last year OR does the patient have an upcoming appointment? No, last visit was 03/31/24  Can we respond through MyChart? No  Agent: Please be advised that Rx refills may take up to 3 business days. We ask that you follow-up with your pharmacy.

## 2024-06-30 NOTE — Telephone Encounter (Signed)
 Requested medications are due for refill today.  yes  Requested medications are on the active medications list.  yes  Last refill. 06/02/2024 #60 0 rf  Future visit scheduled.   no  Notes to clinic.  Refill not delegated.      Requested Prescriptions  Pending Prescriptions Disp Refills   oxyCODONE  (OXY IR/ROXICODONE ) 5 MG immediate release tablet 60 tablet 0    Sig: Take 1 tablet (5 mg total) by mouth 3 (three) times daily as needed for severe pain (pain score 7-10).     Not Delegated - Analgesics:  Opioid Agonists Failed - 06/30/2024  3:12 PM      Failed - This refill cannot be delegated      Failed - Urine Drug Screen completed in last 360 days      Failed - Valid encounter within last 3 months    Recent Outpatient Visits           3 months ago Visit for suture removal   [place] [facility] Medicine Duanne Butler DASEN, MD   3 months ago Squamous cell carcinoma, arm, right   [place] [facility] Duanne Butler DASEN, MD   3 months ago Skin lesion of right arm   [place] [facility] Duanne Butler DASEN, MD   5 months ago AKI (acute kidney injury)   [place] [facility] Medicine Duanne Butler DASEN, MD   9 months ago Type 2 diabetes mellitus with other specified complication, without long-term current use of insulin  [facility])   [place] [facility] Pickard, Butler DASEN, MD

## 2024-08-06 ENCOUNTER — Encounter

## 2024-11-05 ENCOUNTER — Encounter
